# Patient Record
Sex: Female | Born: 1975 | Race: Black or African American | Hispanic: No | Marital: Married | Smoking: Never smoker
Health system: Southern US, Community
[De-identification: ages and names within clinical notes are randomized; demographics above are authoritative.]

## PROBLEM LIST (undated history)

## (undated) DIAGNOSIS — Z992 Dependence on renal dialysis: Secondary | ICD-10-CM

## (undated) DIAGNOSIS — D649 Anemia, unspecified: Secondary | ICD-10-CM

## (undated) DIAGNOSIS — R0602 Shortness of breath: Secondary | ICD-10-CM

## (undated) DIAGNOSIS — N186 End stage renal disease: Secondary | ICD-10-CM

## (undated) DIAGNOSIS — M1990 Unspecified osteoarthritis, unspecified site: Secondary | ICD-10-CM

## (undated) DIAGNOSIS — R519 Headache, unspecified: Secondary | ICD-10-CM

## (undated) DIAGNOSIS — E785 Hyperlipidemia, unspecified: Secondary | ICD-10-CM

## (undated) DIAGNOSIS — E079 Disorder of thyroid, unspecified: Secondary | ICD-10-CM

## (undated) DIAGNOSIS — J189 Pneumonia, unspecified organism: Secondary | ICD-10-CM

## (undated) DIAGNOSIS — S92909A Unspecified fracture of unspecified foot, initial encounter for closed fracture: Secondary | ICD-10-CM

## (undated) DIAGNOSIS — I420 Dilated cardiomyopathy: Secondary | ICD-10-CM

## (undated) DIAGNOSIS — N920 Excessive and frequent menstruation with regular cycle: Secondary | ICD-10-CM

## (undated) DIAGNOSIS — M109 Gout, unspecified: Secondary | ICD-10-CM

## (undated) DIAGNOSIS — I509 Heart failure, unspecified: Secondary | ICD-10-CM

## (undated) DIAGNOSIS — Z5189 Encounter for other specified aftercare: Secondary | ICD-10-CM

## (undated) DIAGNOSIS — Z9289 Personal history of other medical treatment: Secondary | ICD-10-CM

## (undated) DIAGNOSIS — Z8489 Family history of other specified conditions: Secondary | ICD-10-CM

## (undated) DIAGNOSIS — I10 Essential (primary) hypertension: Secondary | ICD-10-CM

## (undated) DIAGNOSIS — R011 Cardiac murmur, unspecified: Secondary | ICD-10-CM

## (undated) DIAGNOSIS — N189 Chronic kidney disease, unspecified: Secondary | ICD-10-CM

## (undated) DIAGNOSIS — F419 Anxiety disorder, unspecified: Secondary | ICD-10-CM

## (undated) DIAGNOSIS — Z8701 Personal history of pneumonia (recurrent): Secondary | ICD-10-CM

## (undated) HISTORY — DX: Personal history of pneumonia (recurrent): Z87.01

## (undated) HISTORY — DX: Disorder of thyroid, unspecified: E07.9

## (undated) HISTORY — DX: Unspecified fracture of unspecified foot, initial encounter for closed fracture: S92.909A

## (undated) HISTORY — DX: Essential (primary) hypertension: I10

## (undated) HISTORY — DX: Chronic kidney disease, unspecified: N18.9

## (undated) HISTORY — DX: Hyperlipidemia, unspecified: E78.5

## (undated) HISTORY — DX: Morbid (severe) obesity due to excess calories: E66.01

## (undated) HISTORY — DX: Dilated cardiomyopathy: I42.0

---
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
## 1999-03-02 ENCOUNTER — Emergency Department (HOSPITAL_COMMUNITY): Admission: EM | Admit: 1999-03-02 | Discharge: 1999-03-02 | Payer: Self-pay | Admitting: Emergency Medicine

## 2002-09-17 ENCOUNTER — Encounter: Payer: Self-pay | Admitting: Emergency Medicine

## 2002-09-17 ENCOUNTER — Emergency Department (HOSPITAL_COMMUNITY): Admission: EM | Admit: 2002-09-17 | Discharge: 2002-09-17 | Payer: Self-pay | Admitting: Emergency Medicine

## 2003-09-09 ENCOUNTER — Emergency Department (HOSPITAL_COMMUNITY): Admission: EM | Admit: 2003-09-09 | Discharge: 2003-09-10 | Payer: Self-pay | Admitting: Emergency Medicine

## 2003-12-01 ENCOUNTER — Emergency Department (HOSPITAL_COMMUNITY): Admission: EM | Admit: 2003-12-01 | Discharge: 2003-12-01 | Payer: Self-pay | Admitting: Emergency Medicine

## 2005-11-10 ENCOUNTER — Emergency Department (HOSPITAL_COMMUNITY): Admission: EM | Admit: 2005-11-10 | Discharge: 2005-11-11 | Payer: Self-pay | Admitting: Emergency Medicine

## 2006-10-21 ENCOUNTER — Emergency Department (HOSPITAL_COMMUNITY): Admission: EM | Admit: 2006-10-21 | Discharge: 2006-10-22 | Payer: Self-pay | Admitting: Emergency Medicine

## 2008-02-15 ENCOUNTER — Ambulatory Visit: Payer: Self-pay | Admitting: Cardiology

## 2008-02-16 ENCOUNTER — Inpatient Hospital Stay (HOSPITAL_COMMUNITY): Admission: EM | Admit: 2008-02-16 | Discharge: 2008-02-20 | Payer: Self-pay | Admitting: Emergency Medicine

## 2008-02-16 ENCOUNTER — Encounter: Payer: Self-pay | Admitting: Cardiovascular Disease

## 2008-03-06 ENCOUNTER — Ambulatory Visit: Payer: Self-pay | Admitting: Cardiovascular Disease

## 2008-03-06 LAB — CONVERTED CEMR LAB
Bilirubin, Direct: 0.1 mg/dL (ref 0.0–0.3)
Calcium: 8.5 mg/dL (ref 8.4–10.5)
GFR calc Af Amer: 35 mL/min
GFR calc non Af Amer: 29 mL/min
Glucose, Bld: 93 mg/dL (ref 70–99)
HDL: 50.3 mg/dL (ref >39.0)
LDL Cholesterol: 106 mg/dL — ABNORMAL HIGH (ref 0–99)
Potassium: 4.3 meq/L (ref 3.5–5.1)
Sodium: 142 meq/L (ref 135–145)
Total Bilirubin: 0.8 mg/dL (ref 0.3–1.2)
Total CHOL/HDL Ratio: 3.5
Total Protein: 7.2 g/dL (ref 6.0–8.3)
VLDL: 20 mg/dL (ref 0–40)

## 2008-04-20 DIAGNOSIS — E785 Hyperlipidemia, unspecified: Secondary | ICD-10-CM | POA: Insufficient documentation

## 2008-04-20 DIAGNOSIS — N183 Chronic kidney disease, stage 3 unspecified: Secondary | ICD-10-CM | POA: Insufficient documentation

## 2008-04-20 DIAGNOSIS — I5021 Acute systolic (congestive) heart failure: Secondary | ICD-10-CM

## 2008-04-20 DIAGNOSIS — E782 Mixed hyperlipidemia: Secondary | ICD-10-CM | POA: Insufficient documentation

## 2008-04-20 DIAGNOSIS — I420 Dilated cardiomyopathy: Secondary | ICD-10-CM | POA: Insufficient documentation

## 2008-04-20 DIAGNOSIS — I10 Essential (primary) hypertension: Secondary | ICD-10-CM | POA: Insufficient documentation

## 2008-04-20 DIAGNOSIS — I428 Other cardiomyopathies: Secondary | ICD-10-CM | POA: Insufficient documentation

## 2008-04-20 DIAGNOSIS — I080 Rheumatic disorders of both mitral and aortic valves: Secondary | ICD-10-CM | POA: Insufficient documentation

## 2008-04-20 HISTORY — DX: Acute systolic (congestive) heart failure: I50.21

## 2008-05-16 ENCOUNTER — Encounter: Payer: Self-pay | Admitting: Cardiovascular Disease

## 2009-05-27 ENCOUNTER — Emergency Department (HOSPITAL_COMMUNITY): Admission: EM | Admit: 2009-05-27 | Discharge: 2009-05-28 | Payer: Self-pay | Admitting: Emergency Medicine

## 2009-11-15 ENCOUNTER — Emergency Department (HOSPITAL_COMMUNITY): Admission: EM | Admit: 2009-11-15 | Discharge: 2009-11-15 | Payer: Self-pay | Admitting: Emergency Medicine

## 2010-03-23 ENCOUNTER — Emergency Department (HOSPITAL_COMMUNITY): Payer: Self-pay

## 2010-03-23 ENCOUNTER — Emergency Department (HOSPITAL_COMMUNITY)
Admission: EM | Admit: 2010-03-23 | Discharge: 2010-03-23 | Disposition: A | Discharge status: Home / Self Care | Payer: Self-pay | Attending: Emergency Medicine | Admitting: Emergency Medicine

## 2010-03-23 DIAGNOSIS — I509 Heart failure, unspecified: Secondary | ICD-10-CM | POA: Insufficient documentation

## 2010-03-23 DIAGNOSIS — K5900 Constipation, unspecified: Secondary | ICD-10-CM | POA: Insufficient documentation

## 2010-03-23 DIAGNOSIS — R1032 Left lower quadrant pain: Secondary | ICD-10-CM | POA: Insufficient documentation

## 2010-03-23 DIAGNOSIS — Z79899 Other long term (current) drug therapy: Secondary | ICD-10-CM | POA: Insufficient documentation

## 2010-03-23 DIAGNOSIS — R197 Diarrhea, unspecified: Secondary | ICD-10-CM | POA: Insufficient documentation

## 2010-03-23 DIAGNOSIS — E7800 Pure hypercholesterolemia, unspecified: Secondary | ICD-10-CM | POA: Insufficient documentation

## 2010-03-23 DIAGNOSIS — R110 Nausea: Secondary | ICD-10-CM | POA: Insufficient documentation

## 2010-03-23 DIAGNOSIS — I10 Essential (primary) hypertension: Secondary | ICD-10-CM | POA: Insufficient documentation

## 2010-03-23 DIAGNOSIS — R10814 Left lower quadrant abdominal tenderness: Secondary | ICD-10-CM | POA: Insufficient documentation

## 2010-03-23 LAB — CBC
Hemoglobin: 12.3 g/dL (ref 12.0–15.0)
MCH: 28.9 pg (ref 26.0–34.0)
Platelets: 250 10*3/uL (ref 150–400)
RBC: 4.26 MIL/uL (ref 3.87–5.11)
WBC: 13.8 10*3/uL — ABNORMAL HIGH (ref 4.0–10.5)

## 2010-03-23 LAB — DIFFERENTIAL
Basophils Absolute: 0.1 10*3/uL (ref 0.0–0.1)
Basophils Relative: 0 % (ref 0–1)
Lymphocytes Relative: 14 % (ref 12–46)
Lymphs Abs: 2 10*3/uL (ref 0.7–4.0)
Monocytes Relative: 6 % (ref 3–12)

## 2010-03-23 LAB — URINE MICROSCOPIC-ADD ON

## 2010-03-23 LAB — URINALYSIS, ROUTINE W REFLEX MICROSCOPIC
Glucose, UA: NEGATIVE mg/dL
Protein, ur: >300 mg/dL — AB
Specific Gravity, Urine: 1.02 (ref 1.005–1.030)

## 2010-03-23 LAB — BASIC METABOLIC PANEL
CO2: 23 mEq/L (ref 19–32)
Chloride: 107 mEq/L (ref 96–112)
GFR calc Af Amer: 16 mL/min — ABNORMAL LOW (ref >60)
Potassium: 4.7 mEq/L (ref 3.5–5.1)
Sodium: 135 mEq/L (ref 135–145)

## 2010-04-21 LAB — URINALYSIS, ROUTINE W REFLEX MICROSCOPIC
Glucose, UA: NEGATIVE mg/dL
Hgb urine dipstick: NEGATIVE
Ketones, ur: NEGATIVE mg/dL
Leukocytes, UA: NEGATIVE
Protein, ur: >300 mg/dL — AB
Urobilinogen, UA: 0.2 mg/dL (ref 0.0–1.0)

## 2010-04-21 LAB — CBC
HCT: 35.5 % — ABNORMAL LOW (ref 36.0–46.0)
HCT: 37 % (ref 36.0–46.0)
HCT: 40.8 % (ref 36.0–46.0)
Hemoglobin: 12.5 g/dL (ref 12.0–15.0)
Hemoglobin: 14.1 g/dL (ref 12.0–15.0)
MCHC: 33.7 g/dL (ref 30.0–36.0)
MCHC: 34.6 g/dL (ref 30.0–36.0)
MCV: 86.8 fL (ref 78.0–100.0)
MCV: 88.6 fL (ref 78.0–100.0)
MCV: 86.7 fL (ref 78.0–100.0)
Platelets: 221 K/uL (ref 150–400)
Platelets: 287 10*3/uL (ref 150–400)
Platelets: 296 10*3/uL (ref 150–400)
RBC: 4.01 MIL/uL (ref 3.87–5.11)
RBC: 4.2 MIL/uL (ref 3.87–5.11)
RBC: 4.7 MIL/uL (ref 3.87–5.11)
RBC: 4.35 MIL/uL (ref 3.87–5.11)
RDW: 15.6 % — ABNORMAL HIGH (ref 11.5–15.5)
RDW: 15.7 % — ABNORMAL HIGH (ref 11.5–15.5)
WBC: 10.5 10*3/uL (ref 4.0–10.5)
WBC: 12.4 K/uL — ABNORMAL HIGH (ref 4.0–10.5)
WBC: 13 10*3/uL — ABNORMAL HIGH (ref 4.0–10.5)
WBC: 9.3 10*3/uL (ref 4.0–10.5)

## 2010-04-21 LAB — COMPREHENSIVE METABOLIC PANEL
ALT: 16 U/L (ref 0–35)
AST: 15 U/L (ref 0–37)
Alkaline Phosphatase: 42 U/L (ref 39–117)
CO2: 29 mEq/L (ref 19–32)
Calcium: 8.3 mg/dL — ABNORMAL LOW (ref 8.4–10.5)
GFR calc Af Amer: 25 mL/min — ABNORMAL LOW (ref >60)
GFR calc non Af Amer: 20 mL/min — ABNORMAL LOW (ref >60)
Glucose, Bld: 84 mg/dL (ref 70–99)
Potassium: 3.4 mEq/L — ABNORMAL LOW (ref 3.5–5.1)
Sodium: 138 mEq/L (ref 135–145)

## 2010-04-21 LAB — POCT I-STAT, CHEM 8
BUN: 19 mg/dL (ref 6–23)
Creatinine, Ser: 2.3 mg/dL — ABNORMAL HIGH (ref 0.4–1.2)
Glucose, Bld: 96 mg/dL (ref 70–99)
Hemoglobin: 14.6 g/dL (ref 12.0–15.0)
Sodium: 142 mEq/L (ref 135–145)
TCO2: 30 mmol/L (ref 0–100)

## 2010-04-21 LAB — LIPID PANEL
HDL: 47 mg/dL (ref >39)
Triglycerides: 89 mg/dL (ref <150)
VLDL: 18 mg/dL (ref 0–40)

## 2010-04-21 LAB — URINALYSIS, MICROSCOPIC ONLY
Glucose, UA: NEGATIVE mg/dL
Leukocytes, UA: NEGATIVE
Protein, ur: >300 mg/dL — AB
Specific Gravity, Urine: 1.019 (ref 1.005–1.030)
pH: 6 (ref 5.0–8.0)

## 2010-04-21 LAB — DIFFERENTIAL
Basophils Absolute: 0 K/uL (ref 0.0–0.1)
Basophils Relative: 0 % (ref 0–1)
Eosinophils Absolute: 0.2 K/uL (ref 0.0–0.7)
Eosinophils Relative: 2 % (ref 0–5)
Lymphocytes Relative: 13 % (ref 12–46)
Lymphs Abs: 1.6 K/uL (ref 0.7–4.0)
Monocytes Absolute: 0.1 K/uL (ref 0.1–1.0)
Monocytes Relative: 1 % — ABNORMAL LOW (ref 3–12)
Neutro Abs: 10.5 K/uL — ABNORMAL HIGH (ref 1.7–7.7)
Neutrophils Relative %: 85 % — ABNORMAL HIGH (ref 43–77)

## 2010-04-21 LAB — CARDIAC PANEL(CRET KIN+CKTOT+MB+TROPI)
Relative Index: 2.7 — ABNORMAL HIGH (ref 0.0–2.5)
Total CK: 102 U/L (ref 7–177)
Total CK: 139 U/L (ref 7–177)
Troponin I: 0.04 ng/mL (ref 0.00–0.06)
Troponin I: 0.05 ng/mL (ref 0.00–0.06)

## 2010-04-21 LAB — RENAL FUNCTION PANEL
Albumin: 2.5 g/dL — ABNORMAL LOW (ref 3.5–5.2)
BUN: 25 mg/dL — ABNORMAL HIGH (ref 6–23)
Calcium: 7.9 mg/dL — ABNORMAL LOW (ref 8.4–10.5)
Chloride: 98 mEq/L (ref 96–112)
Creatinine, Ser: 2.59 mg/dL — ABNORMAL HIGH (ref 0.4–1.2)
GFR calc Af Amer: 26 mL/min — ABNORMAL LOW (ref >60)
GFR calc non Af Amer: 21 mL/min — ABNORMAL LOW (ref >60)
Phosphorus: 5.6 mg/dL — ABNORMAL HIGH (ref 2.3–4.6)

## 2010-04-21 LAB — UIFE/LIGHT CHAINS/TP QN, 24-HR UR
Albumin, U: DETECTED
Beta, Urine: DETECTED — AB
Free Lambda Lt Chains,Ur: 1.78 mg/dL — ABNORMAL HIGH (ref 0.08–1.01)
Gamma Globulin, Urine: DETECTED — AB

## 2010-04-21 LAB — RAPID URINE DRUG SCREEN, HOSP PERFORMED
Amphetamines: NOT DETECTED
Opiates: NOT DETECTED
Tetrahydrocannabinol: NOT DETECTED

## 2010-04-21 LAB — PROTEIN ELECTROPHORESIS, SERUM
Albumin ELP: 43.1 % — ABNORMAL LOW (ref 55.8–66.1)
Beta 2: 6.1 % (ref 3.2–6.5)
Beta Globulin: 7.4 % — ABNORMAL HIGH (ref 4.7–7.2)
Gamma Globulin: 25.1 % — ABNORMAL HIGH (ref 11.1–18.8)

## 2010-04-21 LAB — BASIC METABOLIC PANEL
BUN: 22 mg/dL (ref 6–23)
CO2: 30 mEq/L (ref 19–32)
Calcium: 8.5 mg/dL (ref 8.4–10.5)
Chloride: 103 mEq/L (ref 96–112)
Chloride: 103 mEq/L (ref 96–112)
Creatinine, Ser: 2.53 mg/dL — ABNORMAL HIGH (ref 0.4–1.2)
Creatinine, Ser: 1.97 mg/dL — ABNORMAL HIGH (ref 0.4–1.2)
GFR calc Af Amer: 26 mL/min — ABNORMAL LOW (ref >60)
GFR calc Af Amer: 35 mL/min — ABNORMAL LOW (ref >60)
GFR calc non Af Amer: 26 mL/min — ABNORMAL LOW (ref >60)
Glucose, Bld: 90 mg/dL (ref 70–99)
Potassium: 3.8 mEq/L (ref 3.5–5.1)
Sodium: 139 mEq/L (ref 135–145)

## 2010-04-21 LAB — URINE CULTURE: Colony Count: 30000

## 2010-04-21 LAB — BRAIN NATRIURETIC PEPTIDE
Pro B Natriuretic peptide (BNP): 1008 pg/mL — ABNORMAL HIGH (ref 0.0–100.0)
Pro B Natriuretic peptide (BNP): 406 pg/mL — ABNORMAL HIGH (ref 0.0–100.0)
Pro B Natriuretic peptide (BNP): 814 pg/mL — ABNORMAL HIGH (ref 0.0–100.0)

## 2010-04-21 LAB — TSH: TSH: 1.285 u[IU]/mL (ref 0.350–4.500)

## 2010-04-21 LAB — TROPONIN I: Troponin I: 0.06 ng/mL (ref 0.00–0.06)

## 2010-04-21 LAB — T4, FREE: Free T4: 1.04 ng/dL (ref 0.89–1.80)

## 2010-04-21 LAB — URINE MICROSCOPIC-ADD ON

## 2010-04-21 LAB — CREATININE, URINE, RANDOM: Creatinine, Urine: 239.9 mg/dL

## 2010-04-21 LAB — HEMOGLOBIN A1C
Hgb A1c MFr Bld: 5.4 % (ref 4.6–6.1)
Mean Plasma Glucose: 108 mg/dL

## 2010-04-21 LAB — CK TOTAL AND CKMB (NOT AT ARMC)
CK, MB: 4 ng/mL (ref 0.3–4.0)
Relative Index: 2.3 (ref 0.0–2.5)

## 2010-05-19 NOTE — Consult Note (Signed)
NAMEGERALDINE, [name]             ACCOUNT NO.:  [id number]   MEDICAL RECORD NO.:  [id number]          PATIENT TYPE:  INP   LOCATION:  3705                         FACILITY:  [place]   PHYSICIAN:  Caren Griffins B. Lorrene Reid, M.D.DATE OF BIRTH:  03-12-1975   DATE OF CONSULTATION:  DATE OF DISCHARGE:                                 CONSULTATION   We are asked to see this patient because of abnormal renal function,  proteinuria, and elevated intact PTH.   She is a 35 year old morbidly obese black female who was admitted on  February 15, 2008 with accelerated hypertension.  She presented with a  cough and shortness of breath.  Her blood pressure in the emergency  department was 203/154.  Her chest x-ray was consistent with volume  overload, pulmonary edema, and significant cardiomegaly.  Creatinine on  admission was 2.3.  The only baseline available is from October, 2008,  at which time it was 1.8.   As blood pressure was controlled with IV medications, creatinine peaked  at 2.6, but today is back down to 1.97.   Various studies have been done since admission, including a urinalysis  which showed greater than 300 mg% protein, a renal ultrasound which was  limited because of large body habitus but appeared to show 10.5 and 11.4  cm kidneys, right and left respectively, with some increased  echogenicity.  SPEP showed a polyclonal increase in globulins and  immunofixation on the urine showed no monoclonal free light chains.  Angiotensin converting enzyme level was normal.  Intact PTH was elevated  at 222.   Hypertension has been controlled, initially with IV meds but now  transitioned to po with diastolics in the range of 80 to 100 over the  past 48 hours and she is now on a regimen consisting of carvedilol and  hydralazine at fairly low doses.   Patient states that she was followed at [facility] off and on but  really has not been followed by anybody recently and prior to admission  was not  taking any medications.  She was on Benicar at one time in the  past, but this was too expensive for her to afford.  She was aware that  she had an issue with hypertension for at least the last year, but  simply did not follow up.   PAST MEDICAL HISTORY:  1. Morbid obesity.  2. Hypertension, diagnosed about a year ago and followed at      [facility] (sporadically, due to pt noncompliance).  3. CKD stage 3 with creatinine of 1.8 in October, 2008 and no other      baseline data available.  4. History of cardiac surgery at [facility] at age 38 for a      hole in her heart.  5. Dilated cardiomyopathy with ejection fraction of 30%, severe      diffuse left ventricular hypokinesis, severe left ventricular      hypertrophy, and moderate diastolic dysfunction by echo on      02/16/2008 (further w/u planned to evaluate for infiltrative      process).  6. History of pneumonia.  7. H/s foot fracture in the past   CURRENT MEDICATIONS:  1. Carvedilol 12.5 mg b.i.d.  2. Hydralazine 10 mg t.i.d.  3. Zocor 20 mg daily.  4. Lasix 40 mg IV today.  5. Subcu heparin q.8h.   She has no known drug allergies.   FAMILY HISTORY:  Positive for kidney disease (her mother is one of my  clinic patients at [facility] who has CKD in the setting of  diabetes and hypertension).  Her family history is strongly positive for  hypertension as well as diabetes.   SOCIAL HISTORY:  (Patient works as a Quarry manager.  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.  She does not smoke or drink and has  never used street drugs.  She is single, has no children, has a dog.   REVIEW OF SYSTEMS:  Significant for much improvement in her presentation  with shortness of breath, although she has been chronically short of  breath, she states, all throughout her childhood but much more so on  presentation.  She has had swelling in her legs and feet for at least a  week.  She denies chest pain, nausea,  vomiting, change in appetite,  sputum production, arthritis, arthralgia, hematuria, passage of kidney  stones, tea or coca-cola-colored urine, dysuria, recurrent urinary tract  infections.   PHYSICAL EXAMINATION:  She is a delightful, pleasant but morbidly obese  black female.  Blood pressure 156/100, heart rate in the 70's.  I cannot visualize her  neck veins because of her large body habitus.  Breath sounds are  diminished at the lung bases, but she has no rales or wheezes.  CARDIAC:  Regular.  S1 and S2.  No audible S3 or S4.  No audible murmur.  She has a large abdomen with a very large pannus.  Exam is difficult,  but there is minimally no focal abdominal tenderness.  She has 1+ pretibial pitting edema to the knees bilaterally.   LABORATORY TODAY:  Sodium 139, potassium 3.9, chloride 103, CO2 29, BUN  19, creatinine 1.97, calcium 8.7.  Hemoglobin 12.9, platelets 294,000,  WBC 9300.  Urinalysis:  Specific gravity 1.019 with greater than 300 mg%  protein.  SPEP and urine immunofixation negative.  ACE level normal.  Intact PTH 220.   IMPRESSION:  A 35 year old morbidly obese black female with  1. Accelerated hypertension  - diagnosis of HTN for about a year, but      not consistently treated, currently improved on medication.  2. Acute-on-chronic renal insufficiency in the setting of control of      severe hypertension, with creatinine bump secondary to impaired      autoregulation, now improvined back to baseline  3. Underlying chronic kidney disease (CKD III) with a creatinine of      1.8 in 2008, proteinuria by dipstick.  Her underlying disease could      easily be related to hypertension or to obesity-related focal      segmental glomerular sclerosis.  She will need reassessment      (quantitation) of her proteinuria after at least a month of good      blood pressure control.  She is willing to follow up with me in the      office, so will arrange an appointment to complete her  renal      evaluation as an outpatient.  4. Hyperparathyroidism - new diagnosis.  Will address her elevated PTH      level at  that office appointment.  Vitamin D analogs are fairly      expensive.  She has limited resources.  Will try to get her on a      patient-assistant program as an outpatient.  5. With regard to her antihypertensive regimen, I would add Lasix 40      mg a day to her current program and consider increasing either her      carvedilol or her hydralazine (I have increased hydralazine to 25      mg TID given diastolics of A999333 today).  6. Dilated cardiomyopathy with LVH and LV dilatation with EF 30% -      strongly suspect this is related to poorly controlled hypertension.      Further evaluation per cardiology.   Thanks for asking [place] to see her, and I will follow up with her at  [facility] within the next month.      Elzie Rings Lorrene Reid, M.D.  Electronically Signed     CBD/MEDQ  D:  02/20/2008  T:  02/20/2008  Job:  [id number]   cc:   Faye Ramsay. Johnsie Cancel, MD, [facility]

## 2010-05-19 NOTE — Discharge Summary (Signed)
[name], [name]             ACCOUNT NO.:  [id number]   MEDICAL RECORD NO.:  [id number]          PATIENT TYPE:  INP   LOCATION:  3705                         FACILITY:  [place]   PHYSICIAN:  Lauree Chandler, MDDATE OF BIRTH:  [birth year]   DATE OF ADMISSION:  02/15/2008  DATE OF DISCHARGE:  02/20/2008                               DISCHARGE SUMMARY   PRIMARY CARE PHYSICIAN:  [facility].   PRIMARY CARDIOLOGIST:  None prior to this admission.   DISCHARGE DIAGNOSES:  1. Hypertension that was severely uncontrolled prior to admission with      moderate control prior to discharge.  2. Morbid obesity.  3. Chronic kidney disease, stage III with baseline creatinine of 1.8      in October 2008 and now with baseline of around 2.0.  4. New diagnosis of dilated cardiomyopathy with moderately to markedly      decreased left ventricular systolic function with an ejection      fraction of 30%.  There was severe diffuse left ventricular      hypokinesis with left ventricular wall thickness increased in a      pattern of severe concentric hypertrophy.  There was moderate      diastolic dysfunction.  5. Mild mitral valvular regurgitation.  6. Hyperlipidemia.  7. Acute systolic congestive heart failure.   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  She apparently  stopped taking the medications because she did not have money to fill  them.  She presented to the emergency department on February 15, 2008,  with complaints of shortness of breath, dyspnea on exertion, and cough  for a few weeks that was progressive to the point where she  could not  complete sentences and had become short of breath at rest.  She denied  any chest pain or fevers on admission.  In the emergency department, she  was found to be in congestive heart failure with pulmonary edema on her  chest x-ray as well as lower extremity edema.  Her blood pressure was  203/154 at the time of presentation.  She was treated with a  nitroglycerin drip and a Lasix intravenous drip and transferred to the  intensive care unit.   HOSPITAL COURSE:  The patient was admitted from the emergency department  to the intensive care unit and treated with intravenous nitroglycerin as  well as intravenous Lasix.  She diuresed well and had much better  control of her blood pressure on minimal medical therapy.  She was  initially treated with Imdur after the nitroglycerin drip was stopped,  hydralazine, and Coreg.  She was given bolus dosing of Lasix after the  first hospital day.  It was discovered during this hospitalization that  she had moderate-to-severe left ventricular dysfunction with  concentric  left ventricular hypertrophy and moderate dilation of the left  ventricle.  It was felt that this was most likely secondary to long-  standing hypertension.  There was some talk about a possible cardiac MRI  to rule out infiltrative disease, which was less likely on the  differential.  An ACE level was obtained because of some concern for  sarcoidosis; however, this was normal.  The patient's creatinine rose to  2.6 during intravenous diuresis.  This did come back down to 2.0, which  is felt to be her baseline prior to discharge.  A nephrology consult was  obtained secondary to proteinuria as well as an elevated parathyroid  hormone level.  The patient was seen by Dr. Buelah Manis of Nephrology on  February 20, 2008.  It was felt that her chronic kidney disease is most  likely secondary to hypertension.  Dr. Buelah Manis suggested followup in the  Nephrology Clinic with another assessment  of her urine for protein after  her blood pressure has been under good control for at least a month.  From a cardiac standpoint, the patient diuresed well and had much better  control of her blood pressure prior to discharge.  On the day of  discharge, her blood pressure was in the [id number] systolically.  We talked  about titrating her medications further here in the hospital; however,  the patient was adamant about going home today.  Her discharge  medications will be outlined in detail below.   The patient will need close followup in the Cardiology Office to assess  her left ventricular dysfunction.  We are not going to send her home on  an ACE inhibitor with her current renal insufficiency.  Once it has been  established that her baseline creatinine is around 2.0, then we will  discuss this with Nephrology, consider starting her on a low-dose ACE  inhibitor.  In the meantime, I think it is most important to adequately  control her blood pressure.   DISCHARGE MEDICATIONS:  1. Lasix 40 mg once daily.  2. Hydralazine 25 mg three times daily.  3. Coreg 12.5 mg twice daily.  4. Pravastatin 40 mg once daily.  5. Aspirin 81 mg once daily.   The patient will follow up in my Cardiology Office at the [facility]  location in 3 weeks.  I would like to check a metabolic profile as well  as liver function tests at the time of that visit.  The patient will be  contacted by the Nephrology Office for arrangements to be made for  followup with Dr. Buelah Manis.  The patient is to monitor her weight closely  at home and is to alert our office if she has any significant change in  her weight.  I have encouraged her to take all of her medications as  outlined above.  Further workup of her cardiomyopathy will be obtained  as an outpatient.  I would consider a cardiac MRI; however, we will have  to have this arranged in a location that is able to accommodate someone  with such a large body habitus.    PROCEDURES DURING HOSPITALIZATION:  1. Ultrasound of the abdomen showed a 2.7-cm mobile gallstone in the      gallbladder.  The kidneys were echogenic that suggest a chronic      medical renal disease.  This was a difficult study secondary to the      patient's body habitus.  The kidneys appeared to be grossly normal  in size.  2. Chest x-ray on admission showed pulmonary edema bilaterally, but      prior to discharge had improved.  3. Echocardiogram performed on February 16, 2008, showed markedly      dilated left ventricle with moderately to markedly reduced left      ventricular systolic function.  The left ventricular ejection      fraction is estimated at 30%.  There was severe diffuse left      ventricular hypokinesis.  The left ventricular wall thickness was      increased in a pattern of severe concentric hypertrophy.  These      were consistent with a pseudonormal left ventricular filling      pattern with concomitant abnormal relaxation and increased filling      pressure.  Features were consistent with moderate diastolic      dysfunction.  There was mild mitral valvular regurgitation.  The      left atrium was moderately dilated.   Time spent on discharge including physician time is 45 minutes.      Lauree Chandler, MD  Electronically Signed    CM/MEDQ  D:  02/20/2008  T:  02/21/2008  Job:  [id number]

## 2010-05-19 NOTE — H&P (Signed)
[name], [name]             ACCOUNT NO.:  [id number]   MEDICAL RECORD NO.:  [id number]          PATIENT TYPE:  INP   LOCATION:  1826                         FACILITY:  [place]   PHYSICIAN:  Darryl D. Prime, MD    DATE OF BIRTH:  [birth year]   DATE OF ADMISSION:  02/15/2008  DATE OF DISCHARGE:                              HISTORY & PHYSICAL   The patient has no primary cardiologist.   PRIMARY CARE PHYSICIAN:  She sees Theme park manager.   CHIEF COMPLAINT:  Shortness of breath and cough.   HISTORY OF PRESENT ILLNESS:  [name] is a 35 year old female with a  history of hypertension and obesity who presents with shortness of  breath.  The patient notes hypertension diagnosed for a year.  She was  on Benicar at one point but this was discontinued, unsure exactly why,  and she has been on Lasix but has not refilled the Lasix medication.  She notes shortness of breath, dyspnea on exertion but associated cough  of white sputum for the last few weeks that has been progressive to the  point where she cannot complete sentences, short of breath at rest.  The  patient denies any weight gain recently and notes that she may have  actually lost weight.  She denies any chest pain, no recent fevers.  She  notes orthopnea but no paroxysmal nocturnal dyspnea.  She does not  adhere to a low salt diet.  No wheezing.  In the emergency room the  patient was found to have blood pressures in excess of A999333 systolic with  significant pulmonary edema on chest x-ray and lower extremity edema.  She was given Lasix IV and nitrates were also ordered.   PAST MEDICAL HISTORY:  No history of echocardiogram in the past.  History of hypertension as above.  History in the medical record of  diabetes which she denies.  History of obesity.  History of chronic  kidney disease with creatinine 1.8 in 2008.  She has a history of heart  surgery at the age of 36 years old with a significant scar on her back.  She  knows she had a hole in her heart.   SOCIAL HISTORY:  She is single.  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  No tobacco, alcohol or drug use.   FAMILY HISTORY:  Positive for hypertension in five aunts on her maternal  side and the mother with hypertension.  No one is on dialysis.   REVIEW OF SYSTEMS:  A 14 point review of systems negative unless stated  above.   PHYSICAL EXAMINATION:  VITAL SIGNS:  Temperature is 97.7 with a pulse of  119, respiratory rate is 13, blood pressure 203/154, sats of 91% on room  air.  GENERAL:  She is an obese female sitting upright using her accessory  muscles in mild respiratory distress.  HEENT:  Normocephalic, atraumatic.  Pupils equal, round and reactive to  light.  Oropharynx is moist.  NECK:  Supple.  Jugular venous distention is difficult to assess due to  body habitus.  CARDIOVASCULAR:  Displacement of the point of maximal in both  auscultation of the heart leftward.  S1 and S2 noted.  She may have an  S3 gallop.  ABDOMEN:  Obese, soft, nontender, nondistended with no  hepatosplenomegaly.  EXTREMITIES:  Showed no clubbing or cyanosis.  There is 3+ lower  extremity edema that is tight pitting right side greater than left.   EKG showed sinus rhythm at a rate of 107 beats per minute, PR interval  132, QRS 102, QT corrected 502.  She has significant left ventricular  hypertrophy with evidence of strain and repolarization abnormalities.  Chest x-ray shows cardiomegaly with pulmonary edema, increased edema  compared to August of 2008.   LABORATORY DATA:  White count is 12.4 with a hemoglobin of 14.1,  hematocrit 40.8, platelets of 221, segs 85.  The patient's sodium is  142, potassium 3.9, chloride 104, bicarb 30, BUN 19, creatinine 2.3,  glucose 96.  B type natriuretic peptide was 1008.   ASSESSMENT AND PLAN:  This is a patient with a history of hypertension  at a relatively early age raising the possibility of  secondary causes of  hypertension and part of her problem.  She comes in with profound volume  overload, acute congestive heart failure.  She may indeed have a dilated  cardiomyopathy.  She has pulmonary edema on chest x-ray.  She will be  admitted for vasodilative therapy and diuretics.  Will also control her  blood pressure.  She will be given labetalol drip here initially.  She  will be given nitrates and hydralazine.  Will hold off on ACE inhibitors  for now given her creatinine being up since she will be diuresed.  We  will give her Lasix drip to prevent worsening kidney dysfunction with  diuresis.  Will get an echocardiogram.  She will need a dietary consult.      Darryl D. Prime, MD  Electronically Signed     DDP/MEDQ  D:  02/16/2008  T:  02/16/2008  Job:  [id number]

## 2010-05-26 ENCOUNTER — Encounter: Payer: Self-pay | Admitting: Vascular Surgery

## 2010-06-06 ENCOUNTER — Emergency Department (HOSPITAL_COMMUNITY): Payer: No Typology Code available for payment source

## 2010-06-06 ENCOUNTER — Emergency Department (HOSPITAL_COMMUNITY)
Admission: EM | Admit: 2010-06-06 | Discharge: 2010-06-06 | Disposition: A | Discharge status: Home / Self Care | Payer: No Typology Code available for payment source | Attending: Emergency Medicine | Admitting: Emergency Medicine

## 2010-06-06 DIAGNOSIS — M79609 Pain in unspecified limb: Secondary | ICD-10-CM | POA: Insufficient documentation

## 2010-06-06 DIAGNOSIS — I10 Essential (primary) hypertension: Secondary | ICD-10-CM | POA: Insufficient documentation

## 2010-06-06 DIAGNOSIS — Y92410 Unspecified street and highway as the place of occurrence of the external cause: Secondary | ICD-10-CM | POA: Insufficient documentation

## 2010-06-06 DIAGNOSIS — S8000XA Contusion of unspecified knee, initial encounter: Secondary | ICD-10-CM | POA: Insufficient documentation

## 2010-06-06 DIAGNOSIS — S139XXA Sprain of joints and ligaments of unspecified parts of neck, initial encounter: Secondary | ICD-10-CM | POA: Insufficient documentation

## 2010-06-06 DIAGNOSIS — E7800 Pure hypercholesterolemia, unspecified: Secondary | ICD-10-CM | POA: Insufficient documentation

## 2010-06-11 ENCOUNTER — Emergency Department (HOSPITAL_COMMUNITY)
Admission: EM | Admit: 2010-06-11 | Discharge: 2010-06-12 | Disposition: A | Discharge status: Home / Self Care | Payer: Self-pay | Attending: Emergency Medicine | Admitting: Emergency Medicine

## 2010-06-11 ENCOUNTER — Emergency Department (HOSPITAL_COMMUNITY): Payer: Self-pay

## 2010-06-11 DIAGNOSIS — J189 Pneumonia, unspecified organism: Secondary | ICD-10-CM | POA: Insufficient documentation

## 2010-06-11 DIAGNOSIS — J3489 Other specified disorders of nose and nasal sinuses: Secondary | ICD-10-CM | POA: Insufficient documentation

## 2010-06-11 DIAGNOSIS — E669 Obesity, unspecified: Secondary | ICD-10-CM | POA: Insufficient documentation

## 2010-06-11 DIAGNOSIS — I509 Heart failure, unspecified: Secondary | ICD-10-CM | POA: Insufficient documentation

## 2010-06-11 DIAGNOSIS — R0989 Other specified symptoms and signs involving the circulatory and respiratory systems: Secondary | ICD-10-CM | POA: Insufficient documentation

## 2010-06-11 DIAGNOSIS — R05 Cough: Secondary | ICD-10-CM | POA: Insufficient documentation

## 2010-06-11 DIAGNOSIS — I10 Essential (primary) hypertension: Secondary | ICD-10-CM | POA: Insufficient documentation

## 2010-06-11 DIAGNOSIS — R059 Cough, unspecified: Secondary | ICD-10-CM | POA: Insufficient documentation

## 2010-10-14 LAB — DIFFERENTIAL
Basophils Absolute: 0.1
Lymphocytes Relative: 16
Monocytes Absolute: 0.9 — ABNORMAL HIGH
Neutro Abs: 9.4 — ABNORMAL HIGH

## 2010-10-14 LAB — CBC
Hemoglobin: 12.6
RBC: 4.43
RDW: 14.5 — ABNORMAL HIGH
WBC: 12.7 — ABNORMAL HIGH

## 2010-10-14 LAB — B-NATRIURETIC PEPTIDE (CONVERTED LAB): Pro B Natriuretic peptide (BNP): 370 — ABNORMAL HIGH

## 2010-10-26 ENCOUNTER — Other Ambulatory Visit: Payer: Self-pay

## 2010-10-26 DIAGNOSIS — Z01810 Encounter for preprocedural cardiovascular examination: Secondary | ICD-10-CM

## 2010-10-26 DIAGNOSIS — N189 Chronic kidney disease, unspecified: Secondary | ICD-10-CM

## 2010-11-11 ENCOUNTER — Encounter: Payer: Self-pay | Admitting: Vascular Surgery

## 2010-11-18 ENCOUNTER — Encounter: Payer: Self-pay | Admitting: Vascular Surgery

## 2010-11-18 ENCOUNTER — Encounter (HOSPITAL_COMMUNITY): Payer: Self-pay | Admitting: *Deleted

## 2010-11-18 ENCOUNTER — Emergency Department (HOSPITAL_COMMUNITY)
Admission: EM | Admit: 2010-11-18 | Discharge: 2010-11-18 | Disposition: A | Discharge status: Home / Self Care | Payer: Self-pay | Attending: Emergency Medicine | Admitting: Emergency Medicine

## 2010-11-18 DIAGNOSIS — E875 Hyperkalemia: Secondary | ICD-10-CM | POA: Insufficient documentation

## 2010-11-18 DIAGNOSIS — N189 Chronic kidney disease, unspecified: Secondary | ICD-10-CM | POA: Insufficient documentation

## 2010-11-18 DIAGNOSIS — R109 Unspecified abdominal pain: Secondary | ICD-10-CM | POA: Insufficient documentation

## 2010-11-18 DIAGNOSIS — K5289 Other specified noninfective gastroenteritis and colitis: Secondary | ICD-10-CM | POA: Insufficient documentation

## 2010-11-18 DIAGNOSIS — Z79899 Other long term (current) drug therapy: Secondary | ICD-10-CM | POA: Insufficient documentation

## 2010-11-18 DIAGNOSIS — K529 Noninfective gastroenteritis and colitis, unspecified: Secondary | ICD-10-CM

## 2010-11-18 DIAGNOSIS — I129 Hypertensive chronic kidney disease with stage 1 through stage 4 chronic kidney disease, or unspecified chronic kidney disease: Secondary | ICD-10-CM | POA: Insufficient documentation

## 2010-11-18 DIAGNOSIS — I509 Heart failure, unspecified: Secondary | ICD-10-CM | POA: Insufficient documentation

## 2010-11-18 DIAGNOSIS — E669 Obesity, unspecified: Secondary | ICD-10-CM | POA: Insufficient documentation

## 2010-11-18 DIAGNOSIS — R1110 Vomiting, unspecified: Secondary | ICD-10-CM | POA: Insufficient documentation

## 2010-11-18 DIAGNOSIS — E079 Disorder of thyroid, unspecified: Secondary | ICD-10-CM | POA: Insufficient documentation

## 2010-11-18 DIAGNOSIS — E785 Hyperlipidemia, unspecified: Secondary | ICD-10-CM | POA: Insufficient documentation

## 2010-11-18 DIAGNOSIS — N289 Disorder of kidney and ureter, unspecified: Secondary | ICD-10-CM

## 2010-11-18 HISTORY — DX: Heart failure, unspecified: I50.9

## 2010-11-18 LAB — POCT I-STAT, CHEM 8
BUN: 38 mg/dL — ABNORMAL HIGH (ref 6–23)
Hemoglobin: 13.3 g/dL (ref 12.0–15.0)
Sodium: 142 mEq/L (ref 135–145)
TCO2: 21 mmol/L (ref 0–100)

## 2010-11-18 LAB — POTASSIUM: Potassium: 5.4 mEq/L — ABNORMAL HIGH (ref 3.5–5.1)

## 2010-11-18 MED ORDER — ONDANSETRON 8 MG PO TBDP
8.0000 mg | ORAL_TABLET | Freq: Once | ORAL | Status: AC
  Administered 2010-11-18: 8 mg via ORAL
  Filled 2010-11-18: qty 1

## 2010-11-18 MED ORDER — ONDANSETRON HCL 4 MG/2ML IJ SOLN
4.0000 mg | Freq: Once | INTRAMUSCULAR | Status: AC
  Administered 2010-11-18: 4 mg via INTRAVENOUS

## 2010-11-18 MED ORDER — DICYCLOMINE HCL 10 MG PO CAPS
10.0000 mg | ORAL_CAPSULE | Freq: Once | ORAL | Status: DC
  Filled 2010-11-18: qty 1

## 2010-11-18 MED ORDER — ONDANSETRON HCL 4 MG/2ML IJ SOLN
INTRAMUSCULAR | Status: AC
  Filled 2010-11-18: qty 2

## 2010-11-18 MED ORDER — ONDANSETRON HCL 4 MG PO TABS: 4.0000 mg | ORAL_TABLET | Freq: Four times a day (QID) | ORAL | Status: DC

## 2010-11-18 MED ORDER — SODIUM CHLORIDE 0.9 % IV BOLUS (SEPSIS)
1000.0000 mL | Freq: Once | INTRAVENOUS | Status: AC
  Administered 2010-11-18: 1000 mL via INTRAVENOUS

## 2010-11-18 MED ORDER — SODIUM CHLORIDE 0.9 % IV BOLUS (SEPSIS): 1000.0000 mL | Freq: Once | INTRAVENOUS | Status: DC

## 2010-11-18 MED ORDER — SODIUM POLYSTYRENE SULFONATE 15 GM/60ML PO SUSP
30.0000 g | Freq: Once | ORAL | Status: AC
  Administered 2010-11-18: 30 g via ORAL
  Filled 2010-11-18: qty 120

## 2010-11-18 NOTE — ED Notes (Signed)
Pt reports throbbing abd pain, n/v/d since this am. Emesis x5 today. Denies blood in stool/emesis. No HTN meds taken today.

## 2010-11-18 NOTE — ED Notes (Signed)
Patient given discharge instructions, information, prescriptions, and diet order. Patient states that they adequately understand discharge information given and to return to ED if symptoms return or worsen.     

## 2010-11-18 NOTE — ED Provider Notes (Signed)
History     CSN: [id number] Arrival date & time: 11/18/2010  9:50 AM   First MD Initiated Contact with Patient 11/18/10 1001      Chief Complaint  Patient presents with  . Abdominal Pain    (Consider location/radiation/quality/duration/timing/severity/associated sxs/prior treatment) HPI  Patient presents to emergency department complaining of acute onset abdominal cramping, nausea, vomiting, diarrhea. Patient states she woke this morning and attempted to eat breakfast but then had acute onset abdominal cramping followed by vomiting and diarrhea. Patient states she's had multiple episodes of vomiting and loose stools throughout the morning. Patient states she's been unable to tolerate fluids and has been unable to take her daily medication because of vomiting. Patient denies fevers, chills, chest pain, shortness of breath, blood in the stool or emesis. Patient states she's had friends and family members in the last few days with similar symptoms. Patient states abdominal cramping comes in waves, waxing and waning with no point specific abdominal pain. Grating or alleviating factors. Symptoms were acute onset, waxing and waning but persistent.  Past Medical History  Diagnosis Date  . Chronic kidney disease   . Hypertension   . Thyroid disease   . Morbid obesity   . Hyperlipidemia   . History of pneumonia   . Foot fracture   . Dilated cardiomyopathy   . CHF (congestive heart failure)     History reviewed. No pertinent past surgical history.  No family history on file.  History  Substance Use Topics  . Smoking status: Never Smoker   . Smokeless tobacco: Not on file  . Alcohol Use: No    OB History    Grav Para Term Preterm Abortions TAB SAB Ect Mult Living                  Review of Systems  All other systems reviewed and are negative.    Allergies  Review of patient's allergies indicates no known allergies.  Home Medications   Current Outpatient Rx  Name Route  Sig Dispense Refill  . ASPIRIN EC 81 MG PO TBEC Oral Take 81 mg by mouth daily.     Marland Kitchen CARVEDILOL 12.5 MG PO TABS Oral Take 12.5 mg by mouth 2 (two) times daily with a meal.     . FUROSEMIDE 40 MG PO TABS Oral Take 40 mg by mouth daily.     Marland Kitchen HYDRALAZINE HCL 50 MG PO TABS Oral Take 50 mg by mouth 2 (two) times daily.     Marland Kitchen LISINOPRIL 10 MG PO TABS Oral Take 10 mg by mouth daily.     Marland Kitchen PRAVASTATIN SODIUM 40 MG PO TABS Oral Take 40 mg by mouth daily.     Marland Kitchen VITAMIN D (ERGOCALCIFEROL) 50000 UNITS PO CAPS Oral Take 50,000 Units by mouth 2 (two) times a week. Monday and Wednesday     . TRAMADOL HCL 50 MG PO TABS Oral Take 50 mg by mouth every 6 (six) hours as needed. Maximum dose= 8 tablets per day      BP 154/89  Pulse 89  Temp 97.9 F (36.6 C)  Resp 24  SpO2 100%  LMP 10/28/2010  Physical Exam  Nursing note and vitals reviewed. Constitutional: She is oriented to person, place, and time. She appears well-developed and well-nourished. No distress.       Morbidly obese  HENT:  Head: Normocephalic and atraumatic.  Eyes: Conjunctivae are normal.  Neck: Normal range of motion. Neck supple.  Cardiovascular: Normal rate, regular rhythm, normal  heart sounds and intact distal pulses.  Exam reveals no gallop and no friction rub.   No murmur heard. Pulmonary/Chest: Effort normal and breath sounds normal. No respiratory distress. She has no wheezes. She has no rales. She exhibits no tenderness.  Abdominal: Bowel sounds are normal. She exhibits no distension and no mass. There is no tenderness. There is no rebound and no guarding.  Musculoskeletal: Normal range of motion. She exhibits no edema and no tenderness.  Neurological: She is alert and oriented to person, place, and time.  Skin: Skin is warm and dry. No rash noted. She is not diaphoretic. No erythema.  Psychiatric: She has a normal mood and affect.    ED Course  Procedures (including critical care time)  Fluids, ODT Zofran, by mouth  Bentyl  Patient is followed by nephrologist for chronic kidney disease.  I spoke with Dr. Arty Baumgartner who is oncall for Dr. Lorrene Reid. He will be speaking with Dr. Lorrene Reid in about 30 minutes giving her the patient's information for call back for OP f/u. He advises having patient stop her ACE inhibitor, staying well hydrated, and Dr. Lorrene Reid will call her and have her followup in the office either tomorrow or Friday for recheck of her labs. Patient is agreeable to this plan.   Date: 11/18/2010  Rate: 80  Rhythm: normal sinus rhythm  QRS Axis: normal  Intervals: normal  ST/T Wave abnormalities: normal  Conduction Disutrbances:none  Narrative Interpretation: No significant changes from 02/16/2008 with no peak T waves or decrease in P waves.  Old EKG Reviewed: unchanged    Labs Reviewed  POCT I-STAT, CHEM 8 - Abnormal; Notable for the following:    Potassium 5.5 (*)    Chloride 113 (*)    BUN 38 (*)    Creatinine, Ser 3.60 (*)    All other components within normal limits  I-STAT, CHEM 8   No results found.   1. Gastroenteritis   2. Renal insufficiency   3. Hyperkalemia       MDM  Patient is afebrile, tolerating fluids well, agreeable to following up with her nephrologist tomorrow or Friday for recheck of labs. Patient's stomach is soft, nontender with no peritoneal signs likely gastroenteritis/viral stomach illness.        Eben Burow, [place] 11/18/10 [phone]

## 2010-11-18 NOTE — ED Provider Notes (Signed)
Medical screening examination/treatment/procedure(s) were performed by non-physician practitioner and as supervising physician I was immediately available for consultation/collaboration.    Dot Lanes, MD 11/18/10 [phone]

## 2010-11-19 ENCOUNTER — Encounter: Payer: Self-pay | Admitting: Vascular Surgery

## 2010-11-19 ENCOUNTER — Ambulatory Visit (INDEPENDENT_AMBULATORY_CARE_PROVIDER_SITE_OTHER): Payer: No Typology Code available for payment source | Admitting: Vascular Surgery

## 2010-11-19 VITALS — BP 125/84 | HR 74 | Ht 69.0 in | Wt 367.0 lb

## 2010-11-19 DIAGNOSIS — Z01810 Encounter for preprocedural cardiovascular examination: Secondary | ICD-10-CM

## 2010-11-19 DIAGNOSIS — N184 Chronic kidney disease, stage 4 (severe): Secondary | ICD-10-CM

## 2010-11-19 DIAGNOSIS — N186 End stage renal disease: Secondary | ICD-10-CM

## 2010-11-19 NOTE — Progress Notes (Signed)
BUE vein mapping for AVF placement @ [facility] 11/19/2010

## 2010-11-19 NOTE — Progress Notes (Addendum)
[facility] [place] HISTORY AND PHYSICAL   History of Present Illness:  Patient is a 35 y.o. year old female who presents for placement of a permanent hemodialysis access. The patient is right handed.  The patient is not currently on hemodialysis.  The cause of renal failure is thought to be secondary to hypertension.  Other chronic medical problems include cardiomyopathy, hyperlipidemia. These are currently stable and followed by Dr. Lorrene Reid. She has had no prior access procedures.  Past Medical History  Diagnosis Date  . Chronic kidney disease   . Hypertension   . Thyroid disease   . Morbid obesity   . Hyperlipidemia   . History of pneumonia   . Foot fracture   . Dilated cardiomyopathy   . CHF (congestive heart failure)     Past Surgical History  Procedure Date  . Cardiac surgery 1979    repair of hole in heart     Social History History  Substance Use Topics  . Smoking status: Never Smoker   . Smokeless tobacco: Not on file  . Alcohol Use: No    Family History Family History  Problem Relation Age of Onset  . Diabetes Mother   . Hyperlipidemia Mother   . Hypertension Mother   . Kidney disease Mother     Allergies  No Known Allergies   Current Outpatient Prescriptions  Medication Sig Dispense Refill  . aspirin EC 81 MG tablet Take 81 mg by mouth daily.       . carvedilol (COREG) 12.5 MG tablet Take 12.5 mg by mouth 2 (two) times daily with a meal.       . furosemide (LASIX) 40 MG tablet Take 40 mg by mouth daily.       . hydrALAZINE (APRESOLINE) 50 MG tablet Take 50 mg by mouth 2 (two) times daily.       Marland Kitchen lisinopril (PRINIVIL,ZESTRIL) 10 MG tablet Take 10 mg by mouth daily.       . ondansetron (ZOFRAN) 4 MG tablet Take 1 tablet (4 mg total) by mouth every 6 (six) hours.  12 tablet  0  . pravastatin (PRAVACHOL) 40 MG tablet Take 40 mg by mouth daily.       . Vitamin D, Ergocalciferol, (DRISDOL) 50000 UNITS CAPS Take 50,000 Units by mouth 2  (two) times a week. Monday and Wednesday        No current facility-administered medications for this visit.   Facility-Administered Medications Ordered in Other Visits  Medication Dose Route Frequency Provider Last Rate Last Dose  . DISCONTD: ondansetron (ZOFRAN) 4 MG/2ML injection           . DISCONTD: sodium chloride 0.9 % bolus 1,000 mL  1,000 mL Intravenous Once [employer], [place]        ROS:   General:  No weight loss, Fever, chills  HEENT: No recent headaches, no nasal bleeding, no visual changes, no sore throat  Neurologic: No dizziness, blackouts, seizures. No recent symptoms of stroke or mini- stroke. No recent episodes of slurred speech, or temporary blindness.  Cardiac: No recent episodes of chest pain/pressure, no shortness of breath at rest.  No shortness of breath with exertion.  Denies history of atrial fibrillation or irregular heartbeat  Vascular: No history of rest pain in feet.  No history of claudication.  No history of non-healing ulcer, No history of DVT   Pulmonary: No home oxygen, no productive cough, no hemoptysis,  No asthma or wheezing  Musculoskeletal:  [ ]   Arthritis, [ ]  Low back pain,  [ ]  Joint pain  Hematologic:No history of easy bleeding  Gastrointestinal: Recent gastroenteritis seen in ER yesterday  Urinary: Valu.Nieves ] chronic Kidney disease, [ ]  on HD - [ ]  MWF or [ ]  TTHS, [ ]  Burning with urination, [ ]  Frequent urination, [ ]  Difficulty urinating;   Skin: No rashes  Psychological: No history of anxiety,  No history of depression   Physical Examination  Filed Vitals:   11/19/10 1526  BP: 125/84  Pulse: 74  Height: 5\' 9"  (1.753 m)  Weight: 367 lb (166.47 kg)  SpO2: 100%    Body mass index is 54.20 kg/(m^2).  General:  Alert and oriented, no acute distress HEENT: Normal Neck: No bruit or JVD Pulmonary: Clear to auscultation bilaterally Cardiac: Regular Rate and Rhythm without murmur Gastrointestinal: Soft, non-tender, non-distended,  no mass, no scars, obese Skin: No rash Extremity Pulses:  2+ radial, brachial pulses bilaterally Musculoskeletal: No deformity or edema  Neurologic: Upper and lower extremity motor 5/5 and symmetric  DATA: She had a vein mapping ultrasound today which shows the cephalic vein in the left arm at the forearm level is small. In the upper arm is of good caliber but she has thrombus within the cephalic vein probably from recent IVs or needle sticks at the hospital. In the right upper extremity the forearm vein is also small but the upper arm cephalic vein is also of good quality approximate 4 mm in diameter. The basilic vein is marginal bilaterally but is greater than 3 mm. I reviewed and interpreted her study.   ASSESSMENT: End-stage renal disease now requiring hemodialysis access.   PLAN: Right brachiocephalic AV fistula on [id number]. Risks benefits possible complications and procedure details were explained the patient today including but not limited to bleeding infection non-maturation of the fistula ischemic steal. She understands and agrees to proceed.  Ruta Hinds, MD [facility] [place] Office: [phone] Pager: [phone]  07/29/11  Pt has now decided to have fistula placed.  Procedure scheduled for 08/04/11  Ruta Hinds, MD [facility] [place] Office: [phone] Pager: [phone]

## 2010-11-20 ENCOUNTER — Other Ambulatory Visit: Payer: Self-pay | Admitting: *Deleted

## 2010-11-23 ENCOUNTER — Encounter (HOSPITAL_COMMUNITY): Payer: Self-pay | Admitting: Pharmacy Technician

## 2010-11-27 ENCOUNTER — Encounter (HOSPITAL_COMMUNITY)
Admission: RE | Admit: 2010-11-27 | Discharge: 2010-11-27 | Disposition: A | Discharge status: Home / Self Care | Payer: No Typology Code available for payment source | Source: Ambulatory Visit | Attending: Vascular Surgery | Admitting: Vascular Surgery

## 2010-11-27 ENCOUNTER — Encounter (HOSPITAL_COMMUNITY): Payer: Self-pay

## 2010-11-27 DIAGNOSIS — Z01818 Encounter for other preprocedural examination: Secondary | ICD-10-CM | POA: Insufficient documentation

## 2010-11-27 DIAGNOSIS — Z01812 Encounter for preprocedural laboratory examination: Secondary | ICD-10-CM | POA: Insufficient documentation

## 2010-11-27 HISTORY — DX: Shortness of breath: R06.02

## 2010-11-27 HISTORY — DX: Pneumonia, unspecified organism: J18.9

## 2010-11-27 LAB — CBC
Hemoglobin: 10 g/dL — ABNORMAL LOW (ref 12.0–15.0)
MCH: 29.5 pg (ref 26.0–34.0)
MCHC: 33.1 g/dL (ref 30.0–36.0)
Platelets: 265 10*3/uL (ref 150–400)
RDW: 14.5 % (ref 11.5–15.5)

## 2010-11-27 LAB — DIFFERENTIAL
Basophils Absolute: 0.1 10*3/uL (ref 0.0–0.1)
Eosinophils Relative: 8 % — ABNORMAL HIGH (ref 0–5)
Lymphocytes Relative: 21 % (ref 12–46)
Lymphs Abs: 1.9 10*3/uL (ref 0.7–4.0)
Monocytes Absolute: 0.7 10*3/uL (ref 0.1–1.0)
Neutro Abs: 5.7 10*3/uL (ref 1.7–7.7)

## 2010-11-27 LAB — BASIC METABOLIC PANEL
BUN: 32 mg/dL — ABNORMAL HIGH (ref 6–23)
Chloride: 108 mEq/L (ref 96–112)
GFR calc Af Amer: 17 mL/min — ABNORMAL LOW (ref >90)
GFR calc non Af Amer: 14 mL/min — ABNORMAL LOW (ref >90)
Potassium: 4.3 mEq/L (ref 3.5–5.1)
Sodium: 142 mEq/L (ref 135–145)

## 2010-11-27 LAB — SURGICAL PCR SCREEN: MRSA, PCR: NEGATIVE

## 2010-11-27 LAB — HCG, SERUM, QUALITATIVE: Preg, Serum: NEGATIVE

## 2010-11-27 LAB — PROTIME-INR: Prothrombin Time: 13.7 seconds (ref 11.6–15.2)

## 2010-11-27 MED ORDER — CEFUROXIME SODIUM 1.5 G IJ SOLR: 1.5000 g | INTRAMUSCULAR | Status: DC

## 2010-11-27 NOTE — Pre-Procedure Instructions (Signed)
[place]  11/27/2010   Your procedure is scheduled on:  12/02/2010  Report to [facility] at 0730 AM.  Call this number if you have problems the morning of surgery: [phone]   Remember:   Do not eat food:After Midnight.  Do not drink clear liquids: 4 Hours before arrival.  Take these medicines the morning of surgery with A SIP OF WATER: carvedilol, hydralazine   Do not wear jewelry, make-up or nail polish.  Do not wear lotions, powders, or perfumes. You may wear deodorant.  Do not shave 48 hours prior to surgery.  Do not bring valuables to the hospital.  Contacts, dentures or bridgework may not be worn into surgery.  Leave suitcase in the car. After surgery it may be brought to your room.  For patients admitted to the hospital, checkout time is 11:00 AM the day of discharge.   Patients discharged the day of surgery will not be allowed to drive home.  Name and phone number of your driver: boyfriend- Darnelle Catalan- ph: [phone]  Special Instructions: CHG Shower Use Special Wash: 1/2 bottle night before surgery and 1/2 bottle morning of surgery.   Please read over the following fact sheets that you were given: Pain Booklet, Coughing and Deep Breathing, MRSA Information and Surgical Site Infection Prevention

## 2010-11-27 NOTE — Progress Notes (Signed)
Call to [facility] in [place], reported special needs relative to pt. weight

## 2010-11-28 ENCOUNTER — Encounter (HOSPITAL_COMMUNITY): Payer: Self-pay | Admitting: Anesthesiology

## 2010-11-30 ENCOUNTER — Telehealth: Payer: Self-pay | Admitting: *Deleted

## 2010-11-30 NOTE — Telephone Encounter (Signed)
September called to say she had a bad cold and needed to reshcedule her surgery from 12/02/10 to sometime in Dec. We rescheduled for 12/21/10.

## 2010-12-02 ENCOUNTER — Encounter (HOSPITAL_COMMUNITY): Admission: RE | Payer: Self-pay | Source: Ambulatory Visit

## 2010-12-02 ENCOUNTER — Ambulatory Visit (HOSPITAL_COMMUNITY): Admission: RE | Admit: 2010-12-02 | Payer: Self-pay | Source: Ambulatory Visit | Admitting: Vascular Surgery

## 2010-12-02 SURGERY — ARTERIOVENOUS (AV) FISTULA CREATION
Anesthesia: Monitor Anesthesia Care | Site: Arm Upper | Laterality: Right

## 2010-12-04 NOTE — Procedures (Unsigned)
CEPHALIC VEIN MAPPING  INDICATION:  Chronic kidney disease, stage IV.  HISTORY:  EXAM: The right cephalic vein is compressible.  Diameter measurements range from 0.27 to 0.49 cm.  The right basilic vein is compressible.  Diameter measurements range from 0.25 to 0.35 cm.  The left cephalic vein is not compressible.  Diameter measurements range from 0.21 to 0.72 cm.  the left basilic vein is compressible.  Diameter measurements range from 0.28 to 0.33 cm.  See attached worksheet for all measurements.  IMPRESSION: 1. Patent bilateral basilic veins with diameter measurements as     described above. 2. Patent right cephalic vein with diameter measurements as described     above. 3. Partially thrombosed left cephalic vein with diameter measurements     and thrombus location as noted on worksheet.   ___________________________________________ Jessy Oto. Fields, MD  SH/MEDQ  D:  11/19/2010  T:  11/19/2010  Job:  [id number]

## 2010-12-09 ENCOUNTER — Telehealth: Payer: Self-pay | Admitting: *Deleted

## 2010-12-09 NOTE — Telephone Encounter (Signed)
Spoke with pt whom had cancelled surgery on 12/02/10 for 12/21/10. Dr Oneida Alar had wanted me to reschedule for 12/23/10. She said she had wanted to call to reschedule for 1st of January.I offered her some January dates. She said she would call back in January and do this.

## 2010-12-14 ENCOUNTER — Other Ambulatory Visit (HOSPITAL_COMMUNITY): Payer: No Typology Code available for payment source

## 2010-12-21 ENCOUNTER — Encounter (HOSPITAL_COMMUNITY): Admission: RE | Payer: Self-pay | Source: Ambulatory Visit

## 2010-12-21 ENCOUNTER — Ambulatory Visit (HOSPITAL_COMMUNITY): Admission: RE | Admit: 2010-12-21 | Payer: Self-pay | Source: Ambulatory Visit | Admitting: Vascular Surgery

## 2010-12-21 SURGERY — ARTERIOVENOUS (AV) FISTULA CREATION
Anesthesia: Monitor Anesthesia Care | Site: Arm Upper | Laterality: Right

## 2010-12-25 ENCOUNTER — Encounter: Payer: Self-pay | Admitting: Nephrology

## 2011-01-19 ENCOUNTER — Encounter (HOSPITAL_COMMUNITY): Payer: Self-pay | Admitting: *Deleted

## 2011-01-19 ENCOUNTER — Emergency Department (HOSPITAL_COMMUNITY)
Admission: EM | Admit: 2011-01-19 | Discharge: 2011-01-19 | Disposition: A | Discharge status: Home / Self Care | Payer: Self-pay | Attending: Emergency Medicine | Admitting: Emergency Medicine

## 2011-01-19 ENCOUNTER — Emergency Department (HOSPITAL_COMMUNITY): Payer: Self-pay

## 2011-01-19 DIAGNOSIS — Z7982 Long term (current) use of aspirin: Secondary | ICD-10-CM | POA: Insufficient documentation

## 2011-01-19 DIAGNOSIS — M79609 Pain in unspecified limb: Secondary | ICD-10-CM | POA: Insufficient documentation

## 2011-01-19 DIAGNOSIS — E785 Hyperlipidemia, unspecified: Secondary | ICD-10-CM | POA: Insufficient documentation

## 2011-01-19 DIAGNOSIS — R609 Edema, unspecified: Secondary | ICD-10-CM | POA: Insufficient documentation

## 2011-01-19 DIAGNOSIS — M25579 Pain in unspecified ankle and joints of unspecified foot: Secondary | ICD-10-CM | POA: Insufficient documentation

## 2011-01-19 DIAGNOSIS — I509 Heart failure, unspecified: Secondary | ICD-10-CM | POA: Insufficient documentation

## 2011-01-19 DIAGNOSIS — N189 Chronic kidney disease, unspecified: Secondary | ICD-10-CM | POA: Insufficient documentation

## 2011-01-19 DIAGNOSIS — I129 Hypertensive chronic kidney disease with stage 1 through stage 4 chronic kidney disease, or unspecified chronic kidney disease: Secondary | ICD-10-CM | POA: Insufficient documentation

## 2011-01-19 DIAGNOSIS — M25476 Effusion, unspecified foot: Secondary | ICD-10-CM | POA: Insufficient documentation

## 2011-01-19 DIAGNOSIS — Z79899 Other long term (current) drug therapy: Secondary | ICD-10-CM | POA: Insufficient documentation

## 2011-01-19 DIAGNOSIS — M109 Gout, unspecified: Secondary | ICD-10-CM | POA: Insufficient documentation

## 2011-01-19 DIAGNOSIS — M25473 Effusion, unspecified ankle: Secondary | ICD-10-CM | POA: Insufficient documentation

## 2011-01-19 MED ORDER — HYDROCODONE-ACETAMINOPHEN 5-325 MG PO TABS: 1.0000 | ORAL_TABLET | ORAL | Status: AC | PRN

## 2011-01-19 MED ORDER — INDOMETHACIN 25 MG PO CAPS
50.0000 mg | ORAL_CAPSULE | Freq: Once | ORAL | Status: AC
Start: 2011-01-19 — End: 2011-01-19
  Administered 2011-01-19: 50 mg via ORAL
  Filled 2011-01-19: qty 2

## 2011-01-19 MED ORDER — INDOMETHACIN 50 MG PO CAPS: 50.0000 mg | ORAL_CAPSULE | Freq: Three times a day (TID) | ORAL | Status: AC

## 2011-01-19 MED ORDER — HYDROCODONE-ACETAMINOPHEN 5-325 MG PO TABS
1.0000 | ORAL_TABLET | Freq: Once | ORAL | Status: AC
  Administered 2011-01-19: 1 via ORAL
  Filled 2011-01-19: qty 1

## 2011-01-19 NOTE — ED Provider Notes (Cosign Needed)
History     CSN: [id number]  Arrival date & time 01/19/11  2007   First MD Initiated Contact with Patient 01/19/11 2146      Chief Complaint  Patient presents with  . Foot Pain    (Consider location/radiation/quality/duration/timing/severity/associated sxs/prior treatment) HPI Comments: Patient here with right great toe pain - states over the past week - worse with walking and movement of the toe - states no fever, chills, no history of injury or similar issues.  Patient is a 36 y.o. female presenting with lower extremity pain. The history is provided by the patient. No language interpreter was used.  Foot Pain This is a new problem. The current episode started in the past 7 days. The problem occurs constantly. The problem has been unchanged. Associated symptoms include arthralgias and joint swelling. Pertinent negatives include no abdominal pain, anorexia, chest pain, chills, congestion, coughing, diaphoresis, fatigue, fever, myalgias, nausea, neck pain, numbness, rash, sore throat, vertigo, visual change, vomiting or weakness. The symptoms are aggravated by nothing. She has tried nothing for the symptoms. The treatment provided no relief.  Foot Pain This is a new problem. The current episode started in the past 7 days. The problem occurs constantly. The problem has been unchanged. Pertinent negatives include no chest pain and no abdominal pain. The symptoms are aggravated by nothing. She has tried nothing for the symptoms. The treatment provided no relief.    Past Medical History  Diagnosis Date  . Hypertension   . Thyroid disease   . Morbid obesity   . Hyperlipidemia   . History of pneumonia   . Foot fracture   . Dilated cardiomyopathy   . CHF (congestive heart failure)     hosp. 2011  . Shortness of breath     /w exertion  . Pneumonia     hosp. 2010  . Chronic kidney disease     Past Surgical History  Procedure Date  . Cardiac surgery 1979    repair of hole in heart      Family History  Problem Relation Age of Onset  . Diabetes Mother   . Hyperlipidemia Mother   . Hypertension Mother   . Kidney disease Mother   . Anesthesia problems Neg Hx   . Hypotension Neg Hx   . Malignant hyperthermia Neg Hx   . Pseudochol deficiency Neg Hx     History  Substance Use Topics  . Smoking status: Never Smoker   . Smokeless tobacco: Not on file  . Alcohol Use: Yes     rare use    OB History    Grav Para Term Preterm Abortions TAB SAB Ect Mult Living                  Review of Systems  Constitutional: Negative for fever, chills, diaphoresis and fatigue.  HENT: Negative for congestion, sore throat and neck pain.   Respiratory: Negative for cough.   Cardiovascular: Negative for chest pain.  Gastrointestinal: Negative for nausea, vomiting, abdominal pain and anorexia.  Musculoskeletal: Positive for joint swelling and arthralgias. Negative for myalgias.  Skin: Negative for rash.  Neurological: Negative for vertigo, weakness and numbness.  All other systems reviewed and are negative.    Allergies  Review of patient's allergies indicates no known allergies.  Home Medications   Current Outpatient Rx  Name Route Sig Dispense Refill  . ASPIRIN EC 81 MG PO TBEC Oral Take 81 mg by mouth daily.     Marland Kitchen CARVEDILOL 12.5 MG  PO TABS Oral Take 12.5 mg by mouth 2 (two) times daily with a meal.     . FUROSEMIDE 40 MG PO TABS Oral Take 40 mg by mouth daily.     Marland Kitchen HYDRALAZINE HCL 50 MG PO TABS Oral Take 50 mg by mouth 2 (two) times daily.     Marland Kitchen LISINOPRIL 10 MG PO TABS Oral Take 10 mg by mouth daily.     Marland Kitchen PRAVASTATIN SODIUM 40 MG PO TABS Oral Take 40 mg by mouth daily.     Marland Kitchen VITAMIN D (ERGOCALCIFEROL) 50000 UNITS PO CAPS Oral Take 50,000 Units by mouth 2 (two) times a week. Monday and Wednesday       BP 178/96  Pulse 83  Temp(Src) 98.2 F (36.8 C) (Oral)  Resp 18  Ht 5' 9.5" (1.765 m)  Wt 350 lb (158.759 kg)  BMI 50.94 kg/m2  SpO2 97%  LMP  12/19/2010  Physical Exam  Nursing note and vitals reviewed. Constitutional: She is oriented to person, place, and time. She appears well-developed and well-nourished. No distress.  HENT:  Head: Normocephalic and atraumatic.  Right Ear: External ear normal.  Left Ear: External ear normal.  Nose: Nose normal.  Mouth/Throat: Oropharynx is clear and moist. No oropharyngeal exudate.  Eyes: Conjunctivae are normal. Pupils are equal, round, and reactive to light. No scleral icterus.  Neck: Normal range of motion. Neck supple.  Cardiovascular: Normal rate, regular rhythm and normal heart sounds.  Exam reveals no gallop and no friction rub.   No murmur heard. Pulmonary/Chest: Effort normal and breath sounds normal. She exhibits no tenderness.  Abdominal: Soft. Bowel sounds are normal.  Musculoskeletal: She exhibits edema and tenderness.       Right foot: She exhibits decreased range of motion, tenderness, bony tenderness and swelling.       Feet:  Lymphadenopathy:    She has no cervical adenopathy.  Neurological: She is alert and oriented to person, place, and time. No cranial nerve deficit.  Skin: Skin is warm and dry. There is erythema.       Erythema to MTP joint of right great toe  Psychiatric: She has a normal mood and affect. Her behavior is normal. Judgment and thought content normal.    ED Course  Procedures (including critical care time)  Labs Reviewed - No data to display Dg Foot Complete Right  01/19/2011  *RADIOLOGY REPORT*  Clinical Data: Medial right foot pain and fifth toe pain.  RIGHT FOOT COMPLETE - 3+ VIEW  Comparison: None.  Findings: There is no evidence of fracture or dislocation.  The joint spaces are preserved.  There is no evidence of talar subluxation; the subtalar joint is unremarkable in appearance.  A prominent first toenail is noted.  Small plantar posteromedial spurs are incidentally seen.  The alignment of the foot suggests pes planus.  No significant soft  tissue abnormalities are seen.  IMPRESSION:  1.  No evidence of fracture or dislocation. 2.  Suspect pes planus.  Original Report Authenticated By: Santa Lighter, M.D.     Gout    MDM  Patient with gout noted to right great MTP joint   Medical screening examination/treatment/procedure(s) were performed by non-physician practitioner and as supervising physician I was immediately available for consultation/collaboration. Katy Apo, M.D.     Idalia [place]le [place], [place] 01/19/11 [place], [place] 01/19/11 Seth Ward, MD 01/20/11 [phone]

## 2011-01-19 NOTE — ED Notes (Signed)
The pt has had pain and swelling to her rt foot for 2 days.  No known injury

## 2011-07-29 ENCOUNTER — Ambulatory Visit (INDEPENDENT_AMBULATORY_CARE_PROVIDER_SITE_OTHER): Payer: No Typology Code available for payment source | Admitting: Vascular Surgery

## 2011-07-29 DIAGNOSIS — N186 End stage renal disease: Secondary | ICD-10-CM

## 2011-07-29 NOTE — Progress Notes (Signed)
07/29/11    Pt has now decided to have fistula placed. Procedure scheduled for 08/04/11   Physical exam:  Left upper extremity obese no skin ulcers  Ruta Hinds, MD  [facility] [place]  Office: [phone]  Pager: [phone]

## 2011-08-02 ENCOUNTER — Other Ambulatory Visit: Payer: Self-pay

## 2011-08-03 ENCOUNTER — Encounter (HOSPITAL_COMMUNITY)
Admission: RE | Admit: 2011-08-03 | Discharge: 2011-08-03 | Disposition: A | Discharge status: Home / Self Care | Payer: Self-pay | Source: Ambulatory Visit | Attending: Vascular Surgery | Admitting: Vascular Surgery

## 2011-08-03 ENCOUNTER — Encounter (HOSPITAL_COMMUNITY): Payer: Self-pay

## 2011-08-03 HISTORY — DX: Gout, unspecified: M10.9

## 2011-08-03 HISTORY — DX: Anxiety disorder, unspecified: F41.9

## 2011-08-03 MED ORDER — CEFAZOLIN SODIUM 1-5 GM-% IV SOLN: 1.0000 g | Freq: Once | INTRAVENOUS | Status: DC

## 2011-08-03 MED ORDER — SODIUM CHLORIDE 0.9 % IV SOLN
INTRAVENOUS | Status: DC
  Administered 2011-08-04: 11:00:00 via INTRAVENOUS

## 2011-08-03 MED ORDER — CEFAZOLIN SODIUM-DEXTROSE 2-3 GM-% IV SOLR
2.0000 g | Freq: Once | INTRAVENOUS | Status: DC
  Filled 2011-08-03: qty 50

## 2011-08-03 NOTE — Pre-Procedure Instructions (Signed)
[name]  08/03/2011   Your procedure is scheduled on:  08-04-2011  Report to [facility] at 7:30 AM.  Call this number if you have problems the morning of surgery: [phone]   Remember:   Do not eat food or drink:After Midnight.    .  Take these medicines the morning of surgery with A SIP OF WATER: Allopurinol, Carvedilol(Corgeg)Hydralalazine(Apresoline)   Do not wear jewelry, make-up or nail polish.  Do not wear lotions, powders, or perfumes. You may wear deodorant.  Do not shave 48 hours prior to surgery. Men may shave face and neck.  Do not bring valuables to the hospital.  Contacts, dentures or bridgework may not be worn into surgery.  Leave suitcase in the car. After surgery it may be brought to your room.  For patients admitted to the hospital, checkout time is 11:00 AM the day of discharge.   Patients discharged the day of surgery will not be allowed to drive home.  Name and phone number of your driver:   Special Instructions: CHG Shower Use Special Wash: 1/2 bottle night before surgery and 1/2 bottle morning of surgery.   Please read over the following fact sheets that you were given: Pain Booklet, MRSA Information and Surgical Site Infection Prevention

## 2011-08-04 ENCOUNTER — Encounter (HOSPITAL_COMMUNITY)
Admission: RE | Disposition: A | Discharge status: Home / Self Care | Payer: Self-pay | Source: Ambulatory Visit | Attending: Vascular Surgery

## 2011-08-04 ENCOUNTER — Encounter (HOSPITAL_COMMUNITY): Payer: Self-pay | Admitting: Certified Registered"

## 2011-08-04 ENCOUNTER — Telehealth: Payer: Self-pay | Admitting: Vascular Surgery

## 2011-08-04 ENCOUNTER — Ambulatory Visit (HOSPITAL_COMMUNITY)
Admission: RE | Admit: 2011-08-04 | Discharge: 2011-08-04 | Disposition: A | Discharge status: Home / Self Care | Payer: MEDICAID | Source: Ambulatory Visit | Attending: Vascular Surgery | Admitting: Vascular Surgery

## 2011-08-04 ENCOUNTER — Ambulatory Visit (HOSPITAL_COMMUNITY): Payer: Self-pay | Admitting: Certified Registered"

## 2011-08-04 ENCOUNTER — Encounter (HOSPITAL_COMMUNITY): Payer: Self-pay | Admitting: *Deleted

## 2011-08-04 DIAGNOSIS — I120 Hypertensive chronic kidney disease with stage 5 chronic kidney disease or end stage renal disease: Secondary | ICD-10-CM | POA: Insufficient documentation

## 2011-08-04 DIAGNOSIS — Z01818 Encounter for other preprocedural examination: Secondary | ICD-10-CM | POA: Insufficient documentation

## 2011-08-04 DIAGNOSIS — N183 Chronic kidney disease, stage 3 unspecified: Secondary | ICD-10-CM

## 2011-08-04 DIAGNOSIS — N186 End stage renal disease: Secondary | ICD-10-CM | POA: Insufficient documentation

## 2011-08-04 DIAGNOSIS — Z01810 Encounter for preprocedural cardiovascular examination: Secondary | ICD-10-CM | POA: Insufficient documentation

## 2011-08-04 DIAGNOSIS — Z01812 Encounter for preprocedural laboratory examination: Secondary | ICD-10-CM | POA: Insufficient documentation

## 2011-08-04 DIAGNOSIS — F411 Generalized anxiety disorder: Secondary | ICD-10-CM | POA: Insufficient documentation

## 2011-08-04 HISTORY — PX: AV FISTULA PLACEMENT: SHX1204

## 2011-08-04 LAB — POCT I-STAT 4, (NA,K, GLUC, HGB,HCT)
Glucose, Bld: 99 mg/dL (ref 70–99)
HCT: 35 % — ABNORMAL LOW (ref 36.0–46.0)
Hemoglobin: 11.9 g/dL — ABNORMAL LOW (ref 12.0–15.0)
Potassium: 3.2 mEq/L — ABNORMAL LOW (ref 3.5–5.1)

## 2011-08-04 SURGERY — ARTERIOVENOUS (AV) FISTULA CREATION
Anesthesia: General | Site: Arm Upper | Laterality: Right | Wound class: Clean

## 2011-08-04 MED ORDER — SUCCINYLCHOLINE CHLORIDE 20 MG/ML IJ SOLN
INTRAMUSCULAR | Status: DC | PRN
  Administered 2011-08-04: 140 mg via INTRAVENOUS

## 2011-08-04 MED ORDER — MIDAZOLAM HCL 5 MG/5ML IJ SOLN
INTRAMUSCULAR | Status: DC | PRN
  Administered 2011-08-04: 2 mg via INTRAVENOUS

## 2011-08-04 MED ORDER — CEFAZOLIN SODIUM 1-5 GM-% IV SOLN
INTRAVENOUS | Status: DC | PRN
  Administered 2011-08-04: 2 g via INTRAVENOUS

## 2011-08-04 MED ORDER — HEPARIN SODIUM (PORCINE) 1000 UNIT/ML IJ SOLN
INTRAMUSCULAR | Status: DC | PRN
  Administered 2011-08-04: 7000 [IU] via INTRAVENOUS

## 2011-08-04 MED ORDER — OXYCODONE-ACETAMINOPHEN 5-325 MG PO TABS
ORAL_TABLET | ORAL | Status: AC
  Filled 2011-08-04: qty 1

## 2011-08-04 MED ORDER — OXYCODONE-ACETAMINOPHEN 7.5-325 MG PO TABS: 1.0000 | ORAL_TABLET | ORAL | Status: AC | PRN

## 2011-08-04 MED ORDER — HYDROMORPHONE HCL PF 1 MG/ML IJ SOLN: 0.2500 mg | INTRAMUSCULAR | Status: DC | PRN

## 2011-08-04 MED ORDER — MUPIROCIN 2 % EX OINT
TOPICAL_OINTMENT | Freq: Two times a day (BID) | CUTANEOUS | Status: DC
  Filled 2011-08-04: qty 22

## 2011-08-04 MED ORDER — THROMBIN 20000 UNITS EX SOLR
CUTANEOUS | Status: AC
  Filled 2011-08-04: qty 20000

## 2011-08-04 MED ORDER — 0.9 % SODIUM CHLORIDE (POUR BTL) OPTIME
TOPICAL | Status: DC | PRN
  Administered 2011-08-04: 1000 mL

## 2011-08-04 MED ORDER — EPHEDRINE SULFATE 50 MG/ML IJ SOLN
INTRAMUSCULAR | Status: DC | PRN
  Administered 2011-08-04 (×2): 10 mg via INTRAVENOUS

## 2011-08-04 MED ORDER — MUPIROCIN 2 % EX OINT
TOPICAL_OINTMENT | CUTANEOUS | Status: AC
  Administered 2011-08-04: 1 via NASAL
  Filled 2011-08-04: qty 22

## 2011-08-04 MED ORDER — ONDANSETRON HCL 4 MG/2ML IJ SOLN
INTRAMUSCULAR | Status: DC | PRN
  Administered 2011-08-04: 4 mg via INTRAVENOUS

## 2011-08-04 MED ORDER — FENTANYL CITRATE 0.05 MG/ML IJ SOLN
INTRAMUSCULAR | Status: DC | PRN
  Administered 2011-08-04: 150 ug via INTRAVENOUS
  Administered 2011-08-04: 100 ug via INTRAVENOUS

## 2011-08-04 MED ORDER — PROPOFOL 10 MG/ML IV EMUL
INTRAVENOUS | Status: DC | PRN
  Administered 2011-08-04: 300 mg via INTRAVENOUS

## 2011-08-04 MED ORDER — SODIUM CHLORIDE 0.9 % IR SOLN
Status: DC | PRN
  Administered 2011-08-04: 13:00:00

## 2011-08-04 MED ORDER — OXYCODONE-ACETAMINOPHEN 5-325 MG PO TABS
1.0000 | ORAL_TABLET | Freq: Once | ORAL | Status: AC
  Administered 2011-08-04: 1 via ORAL

## 2011-08-04 MED ORDER — SODIUM CHLORIDE 0.9 % IV SOLN
INTRAVENOUS | Status: DC | PRN
  Administered 2011-08-04: 13:00:00 via INTRAVENOUS

## 2011-08-04 SURGICAL SUPPLY — 45 items
ADH SKN CLS APL DERMABOND .7 (GAUZE/BANDAGES/DRESSINGS) ×1
ADH SKN CLS LQ APL DERMABOND (GAUZE/BANDAGES/DRESSINGS) ×1
CANISTER SUCTION 2500CC (MISCELLANEOUS) ×2 IMPLANT
CLIP TI MEDIUM 6 (CLIP) ×2 IMPLANT
CLIP TI WIDE RED SMALL 6 (CLIP) ×2 IMPLANT
CLOTH BEACON ORANGE TIMEOUT ST (SAFETY) ×2 IMPLANT
COVER PROBE W GEL 5X96 (DRAPES) ×2 IMPLANT
COVER SURGICAL LIGHT HANDLE (MISCELLANEOUS) ×2 IMPLANT
DECANTER SPIKE VIAL GLASS SM (MISCELLANEOUS) ×1 IMPLANT
DERMABOND ADHESIVE PROPEN (GAUZE/BANDAGES/DRESSINGS) ×1
DERMABOND ADVANCED (GAUZE/BANDAGES/DRESSINGS) ×1
DERMABOND ADVANCED .7 DNX12 (GAUZE/BANDAGES/DRESSINGS) ×1 IMPLANT
DERMABOND ADVANCED .7 DNX6 (GAUZE/BANDAGES/DRESSINGS) IMPLANT
DRAIN PENROSE 1/4X12 LTX STRL (WOUND CARE) ×2 IMPLANT
ELECT REM PT RETURN 9FT ADLT (ELECTROSURGICAL) ×2
ELECTRODE REM PT RTRN 9FT ADLT (ELECTROSURGICAL) ×1 IMPLANT
GAUZE SPONGE 2X2 8PLY STRL LF (GAUZE/BANDAGES/DRESSINGS) ×1 IMPLANT
GEL ULTRASOUND 20GR AQUASONIC (MISCELLANEOUS) IMPLANT
GLOVE BIO SURGEON STRL SZ 6.5 (GLOVE) ×1 IMPLANT
GLOVE BIO SURGEON STRL SZ7.5 (GLOVE) ×2 IMPLANT
GLOVE BIOGEL PI IND STRL 6.5 (GLOVE) IMPLANT
GLOVE BIOGEL PI IND STRL 7.5 (GLOVE) IMPLANT
GLOVE BIOGEL PI INDICATOR 6.5 (GLOVE) ×2
GLOVE BIOGEL PI INDICATOR 7.5 (GLOVE) ×1
GLOVE SURG SS PI 7.5 STRL IVOR (GLOVE) ×1 IMPLANT
GOWN PREVENTION PLUS XLARGE (GOWN DISPOSABLE) ×2 IMPLANT
GOWN STRL NON-REIN LRG LVL3 (GOWN DISPOSABLE) ×4 IMPLANT
HOVERMATT SINGLE USE (MISCELLANEOUS) ×1 IMPLANT
KIT BASIN OR (CUSTOM PROCEDURE TRAY) ×2 IMPLANT
KIT ROOM TURNOVER OR (KITS) ×2 IMPLANT
LOOP VESSEL MINI RED (MISCELLANEOUS) IMPLANT
NS IRRIG 1000ML POUR BTL (IV SOLUTION) ×2 IMPLANT
PACK CV ACCESS (CUSTOM PROCEDURE TRAY) ×2 IMPLANT
PAD ARMBOARD 7.5X6 YLW CONV (MISCELLANEOUS) ×4 IMPLANT
SPONGE GAUZE 2X2 STER 10/PKG (GAUZE/BANDAGES/DRESSINGS) ×1
SPONGE SURGIFOAM ABS GEL 100 (HEMOSTASIS) IMPLANT
SUT PROLENE 6 0 CC (SUTURE) ×1 IMPLANT
SUT PROLENE 7 0 BV 1 (SUTURE) ×2 IMPLANT
SUT VIC AB 3-0 SH 27 (SUTURE) ×2
SUT VIC AB 3-0 SH 27X BRD (SUTURE) ×1 IMPLANT
SUT VICRYL 4-0 PS2 18IN ABS (SUTURE) ×2 IMPLANT
TOWEL OR 17X24 6PK STRL BLUE (TOWEL DISPOSABLE) ×2 IMPLANT
TOWEL OR 17X26 10 PK STRL BLUE (TOWEL DISPOSABLE) ×2 IMPLANT
UNDERPAD 30X30 INCONTINENT (UNDERPADS AND DIAPERS) ×2 IMPLANT
WATER STERILE IRR 1000ML POUR (IV SOLUTION) ×2 IMPLANT

## 2011-08-04 NOTE — H&P (View-Only) (Signed)
07/29/11    Pt has now decided to have fistula placed. Procedure scheduled for 08/04/11   Physical exam:  Left upper extremity obese no skin ulcers  Ruta Hinds, MD  [facility] [place]  Office: [phone]  Pager: [phone]

## 2011-08-04 NOTE — Progress Notes (Signed)
Patient meets discharge criteria. Waiting on additional family member to be ready for discharge prior to d/c patient

## 2011-08-04 NOTE — Op Note (Signed)
Procedure: Rightt Brachial Cephalic AV fistula  Preop: ESRD  Postop: ESRD  Anesthesia: General  Assistant:Jesse Shear RNFA  Findings: 3 mm cephalic vein  Procedure: After obtaining informed consent, the patient was taken to the operating room.  After induction of general anesthesia, the right upper extremity was prepped and draped in usual sterile fashion.  A transverse incision was then made near the antecubital crease the right arm. The incision was carried into the subcutaneous tissues down to level of the cephalic vein. The cephalic vein was approximately 3 mm in diameter. It was of good quality. This was dissected free circumferentially and small side branches ligated and divided between silk ties or clips. Next the brachial artery was dissected free in the medial portion of the incision. The artery was  3-4 mm in diameter. There was some spasm.  The vessel loops were placed proximal and distal to the planned site of arteriotomy. The patient was given 7000 units of intravenous heparin. After appropriate circulation time, the vessel loops were used to control the artery. A longitudinal opening was made in the brachial artery.  The vein was ligated distally with a 2-0 silk tie. The vein was controlled proximally with a fine bulldog clamp. The vein was then swung over to the artery and sewn end of vein to side of artery using a running 7-0 Prolene suture. Just prior to completion of the anastomosis, everything was fore bled back bled and thoroughly flushed. The anastomosis was secured, vessel loops released, and there was a palpable thrill in the fistula immediately. After hemostasis was obtained, the subcutaneous tissues were reapproximated using a running 3-0 Vicryl suture. The skin was then closed with a 4 Vicryl subcuticular stitch. Dermabond was applied to the skin incision.  The patient had a palpable radial pulse at the end of the case.  Ruta Hinds, MD [facility]  [place] Office: [phone] Pager: [phone]

## 2011-08-04 NOTE — Telephone Encounter (Signed)
LVM for patient regarding appt for follow up on 09/16/11

## 2011-08-04 NOTE — Anesthesia Procedure Notes (Signed)
Procedure Name: Intubation Date/Time: 08/04/2011 1:06 PM Performed by: Jon Gills A Pre-anesthesia Checklist: Patient identified, Emergency Drugs available, Suction available and Patient being monitored Patient Re-evaluated:Patient Re-evaluated prior to inductionOxygen Delivery Method: Circle system utilized Preoxygenation: Pre-oxygenation with 100% oxygen Intubation Type: IV induction and Rapid sequence Tube size: 7.0 mm Number of attempts: 1 Airway Equipment and Method: Video-laryngoscopy Placement Confirmation: ETT inserted through vocal cords under direct vision,  positive ETCO2 and breath sounds checked- equal and bilateral Secured at: 22 cm Tube secured with: Tape Dental Injury: Teeth and Oropharynx as per pre-operative assessment

## 2011-08-04 NOTE — Interval H&P Note (Signed)
History and Physical Interval Note:  08/04/2011 12:12 PM  [name]  has presented today for surgery, with the diagnosis of End Stage Renal Disease  The various methods of treatment have been discussed with the patient and family. After consideration of risks, benefits and other options for treatment, the patient has consented to  Procedure(s) (LRB): ARTERIOVENOUS (AV) FISTULA CREATION (Right) as a surgical intervention .  The patient's history has been reviewed, patient examined, no change in status, stable for surgery.  I have reviewed the patient's chart and labs.  Questions were answered to the patient's satisfaction.     Ciin Brazzel E

## 2011-08-04 NOTE — Transfer of Care (Signed)
Immediate Anesthesia Transfer of Care Note  Patient: [name]  Procedure(s) Performed: Procedure(s) (LRB): ARTERIOVENOUS (AV) FISTULA CREATION (Right)  Patient Location: PACU  Anesthesia Type: General  Level of Consciousness: awake, alert  and patient cooperative  Airway & Oxygen Therapy: Patient Spontanous Breathing and Patient connected to face mask oxygen  Post-op Assessment: Report given to PACU RN  Post vital signs: Reviewed and stable  Complications: No apparent anesthesia complications

## 2011-08-04 NOTE — Interval H&P Note (Signed)
History and Physical Interval Note:  08/04/2011 12:20 PM  [facility] SHORT STAY H&P  CC:  Needs access  HPI: Needs hemodialysis access  Past Medical History  Diagnosis Date  . Hypertension   . Thyroid disease   . Morbid obesity   . Hyperlipidemia   . History of pneumonia   . Foot fracture   . Dilated cardiomyopathy   . Chronic kidney disease   . Anxiety   . Pneumonia     hosp. 2010  . CHF (congestive heart failure)     hosp. 2011  . Gout     FH:  Non-Contributory  Social HX History  Substance Use Topics  . Smoking status: Never Smoker   . Smokeless tobacco: Not on file  . Alcohol Use: Yes     rare use    Allergies No Known Allergies  Medications Current Facility-Administered Medications  Medication Dose Route Frequency Provider Last Rate Last Dose  . 0.9 %  sodium chloride infusion   Intravenous Continuous Elam Dutch, MD 20 mL/hr at 08/04/11 1129    . ceFAZolin (ANCEF) IVPB 2 g/50 mL premix  2 g Intravenous Once Elam Dutch, MD      . mupirocin ointment (BACTROBAN) 2 %   Nasal BID Elam Dutch, MD      . mupirocin ointment (BACTROBAN) 2 %        1 application at [id number]    PHYSICAL EXAM  Filed Vitals:   08/04/11 0748  BP: 143/78  Pulse: 73  Temp: 98.4 F (36.9 C)  Resp: 20    General:  WDWN in NAD HENT: WNL Eyes: Pupils equal Pulmonary: normal non-labored breathing , without Rales, rhonchi,  wheezing Cardiac: RRR, Vascular Exam/Pulses:  2+ brachial and radial obese arms  Extremities without ischemic changes, no Gangrene , no cellulitis; no open wounds;  Neuro A&O x 3; good sensation; motion in all extremities  Impression: Needs access  Plan: Right arm AV fistula today  [name] @TODAY @ 12:20 PM    [name]  has presented today for surgery, with the diagnosis of End Stage Renal Disease  The various methods of treatment have been discussed with the patient and family. After  consideration of risks, benefits and other options for treatment, the patient has consented to  Procedure(s) (LRB): ARTERIOVENOUS (AV) FISTULA CREATION (Right) as a surgical intervention .  The patient's history has been reviewed, patient examined, no change in status, stable for surgery.  I have reviewed the patient's chart and labs.  Questions were answered to the patient's satisfaction.     [name] E

## 2011-08-04 NOTE — Anesthesia Preprocedure Evaluation (Signed)
Anesthesia Evaluation  Patient identified by MRN, date of birth, ID band Patient awake    Reviewed: Allergy & Precautions, H&P , NPO status , Patient's Chart, lab work & pertinent test results  Airway Mallampati: II      Dental   Pulmonary pneumonia -,  breath sounds clear to auscultation        Cardiovascular hypertension, +CHF Rhythm:Regular Rate:Normal     Neuro/Psych Anxiety    GI/Hepatic negative GI ROS, Neg liver ROS,   Endo/Other  negative endocrine ROS  Renal/GU negative Renal ROS     Musculoskeletal   Abdominal   Peds  Hematology negative hematology ROS (+)   Anesthesia Other Findings   Reproductive/Obstetrics                           Anesthesia Physical Anesthesia Plan  ASA: III  Anesthesia Plan: General   Post-op Pain Management:    Induction: Intravenous  Airway Management Planned: Oral ETT  Additional Equipment:   Intra-op Plan:   Post-operative Plan: Extubation in OR  Informed Consent: I have reviewed the patients History and Physical, chart, labs and discussed the procedure including the risks, benefits and alternatives for the proposed anesthesia with the patient or authorized representative who has indicated his/her understanding and acceptance.   Dental advisory given  Plan Discussed with: CRNA, Anesthesiologist and Surgeon  Anesthesia Plan Comments:         Anesthesia Quick Evaluation

## 2011-08-04 NOTE — Preoperative (Signed)
Beta Blockers   Reason not to administer Beta Blockers:Not Applicable 

## 2011-08-04 NOTE — Anesthesia Postprocedure Evaluation (Signed)
  Anesthesia Post-op Note  Patient: [name]  Procedure(s) Performed: Procedure(s) (LRB): ARTERIOVENOUS (AV) FISTULA CREATION (Right)  Patient Location: PACU  Anesthesia Type: General  Level of Consciousness: awake  Airway and Oxygen Therapy: Patient Spontanous Breathing  Post-op Pain: mild  Post-op Assessment: Post-op Vital signs reviewed  Post-op Vital Signs: Reviewed  Complications: No apparent anesthesia complications

## 2011-08-04 NOTE — Telephone Encounter (Signed)
Message copied by Gena Fray on Wed Aug 04, 2011  2:50 PM ------      Message from: [place]: Wed Aug 04, 2011  2:31 PM       Right brachial cephalic AVF, nurse asst      Needs follow up in 6 weeks            Juanda Crumble

## 2011-08-04 NOTE — Progress Notes (Signed)
Report given to Melanie,RN 

## 2011-08-04 NOTE — Procedures (Signed)
Dr Oneida Alar aware pt has tingling in rt arm.  States this is ok, and that pt may cont to be d/c home.

## 2011-08-05 ENCOUNTER — Encounter (HOSPITAL_COMMUNITY): Payer: Self-pay | Admitting: Vascular Surgery

## 2011-09-10 ENCOUNTER — Other Ambulatory Visit: Payer: Self-pay | Admitting: *Deleted

## 2011-09-10 DIAGNOSIS — Z4931 Encounter for adequacy testing for hemodialysis: Secondary | ICD-10-CM

## 2011-09-10 DIAGNOSIS — N186 End stage renal disease: Secondary | ICD-10-CM

## 2011-09-15 ENCOUNTER — Encounter: Payer: Self-pay | Admitting: Vascular Surgery

## 2011-09-16 ENCOUNTER — Ambulatory Visit (INDEPENDENT_AMBULATORY_CARE_PROVIDER_SITE_OTHER): Payer: Self-pay | Admitting: Vascular Surgery

## 2011-09-16 ENCOUNTER — Encounter: Payer: Self-pay | Admitting: Vascular Surgery

## 2011-09-16 ENCOUNTER — Encounter (INDEPENDENT_AMBULATORY_CARE_PROVIDER_SITE_OTHER): Payer: Self-pay | Admitting: *Deleted

## 2011-09-16 VITALS — BP 182/87 | HR 72 | Resp 16 | Ht 69.5 in | Wt 362.0 lb

## 2011-09-16 DIAGNOSIS — N186 End stage renal disease: Secondary | ICD-10-CM

## 2011-09-16 DIAGNOSIS — Z4931 Encounter for adequacy testing for hemodialysis: Secondary | ICD-10-CM

## 2011-09-16 DIAGNOSIS — T82898A Other specified complication of vascular prosthetic devices, implants and grafts, initial encounter: Secondary | ICD-10-CM

## 2011-09-16 NOTE — Progress Notes (Signed)
Patient is a 36 year old female who underwent right brachiocephalic AV fistula placement approximate 6 weeks ago. She returns for followup today. She is currently not on dialysis. She denies any numbness tingling or aching in her right hand. She is able to check for a thrill daily.  Physical exam:  Filed Vitals:   09/16/11 1600  BP: 182/87  Pulse: 72  Resp: 16  Height: 5' 9.5" (1.765 m)  Weight: 362 lb (164.202 kg)  SpO2: 100%   Right upper extremity: Well-healed antecubital incision easily palpable thrill fistula is deep in the upper arm  Data: Duplex of AV fistula was performed today. There may be some narrowing of the proximal portion and a retained valve. Most of the fistula 7-8 mm in diameter. However it is greater than a centimeter in depth throughout most of its course.  Assessment: Maturing AV fistula right arm. She may have some proximal narrowing of the fistula the thrill is easily palpable today.  Plan: Followup in 6 weeks with repeat duplex of her AV fistula. At that time we will consider Superficialization of the fistula. We will also reevaluate the proximal portion of the fistula and consider whether or not this needs revision.  Ruta Hinds, MD [facility] [place] Office: [phone] Pager: [phone]

## 2011-10-21 ENCOUNTER — Other Ambulatory Visit: Payer: Self-pay | Admitting: *Deleted

## 2011-10-21 DIAGNOSIS — Z4932 Encounter for adequacy testing for peritoneal dialysis: Secondary | ICD-10-CM

## 2011-10-21 DIAGNOSIS — N186 End stage renal disease: Secondary | ICD-10-CM

## 2011-10-27 ENCOUNTER — Encounter: Payer: Self-pay | Admitting: Vascular Surgery

## 2011-10-28 ENCOUNTER — Ambulatory Visit (INDEPENDENT_AMBULATORY_CARE_PROVIDER_SITE_OTHER): Payer: Self-pay | Admitting: Vascular Surgery

## 2011-10-28 ENCOUNTER — Ambulatory Visit: Payer: Self-pay | Admitting: Vascular Surgery

## 2011-10-28 ENCOUNTER — Encounter (INDEPENDENT_AMBULATORY_CARE_PROVIDER_SITE_OTHER): Payer: Self-pay | Admitting: *Deleted

## 2011-10-28 ENCOUNTER — Encounter: Payer: Self-pay | Admitting: Vascular Surgery

## 2011-10-28 VITALS — BP 169/86 | HR 70 | Ht 69.5 in | Wt 360.0 lb

## 2011-10-28 DIAGNOSIS — N186 End stage renal disease: Secondary | ICD-10-CM

## 2011-10-28 DIAGNOSIS — T82598A Other mechanical complication of other cardiac and vascular devices and implants, initial encounter: Secondary | ICD-10-CM

## 2011-10-28 DIAGNOSIS — Z4932 Encounter for adequacy testing for peritoneal dialysis: Secondary | ICD-10-CM

## 2011-10-28 DIAGNOSIS — Z48812 Encounter for surgical aftercare following surgery on the circulatory system: Secondary | ICD-10-CM

## 2011-10-28 NOTE — Progress Notes (Signed)
[facility] [place] HISTORY AND PHYSICAL    History of Present Illness:  Patient is a 36 y.o. year old female who presents for post-operative follow-up after placement of a right brachiocephalic AV fistula July 31. She is currently not on dialysis. She denies any numbness or tingling in her hand.   Review of systems. The patient denies any skin itching. She denies any shortness of breath. She denies any chest pain.  Physical Examination  Filed Vitals:   10/28/11 1214  BP: 169/86  Pulse: 70  Height: 5' 9.5" (1.765 m)  Weight: 360 lb (163.295 kg)  SpO2: 96%  General:  Alert and oriented, no acute distress Neck: No bruit or JVD Skin: No rash Extremities:  Palpable thrill in fistula, the fistula is difficult to palpate in the upper arm Neurologic: Upper and lower extremity motor 5/5 and symmetric  DATA: The patient had a duplex ultrasound of her AV fistula today which I reviewed and interpreted. The fistula is 9-10 mm throughout most of its course. The proximal perianastomotic area of the fistula is 3 mm in diameter. There is an adjacent larger cephalic vein branch.   ASSESSMENT: Non-maturing AV fistula primarily secondary to depth of vein.   PLAN:  Superficialization of right arm AV fistula and possible revision to larger branch of cephalic vein on November 25. The patient wished to delay things for a few weeks to do a very ill aunt.   Ruta Hinds, MD [facility] [place] Office: [phone] Pager: [phone]

## 2011-11-25 ENCOUNTER — Encounter (HOSPITAL_COMMUNITY): Payer: Self-pay | Admitting: Respiratory Therapy

## 2011-11-25 ENCOUNTER — Other Ambulatory Visit: Payer: Self-pay | Admitting: *Deleted

## 2011-12-07 ENCOUNTER — Encounter (HOSPITAL_COMMUNITY): Admission: RE | Payer: Self-pay | Source: Ambulatory Visit

## 2011-12-07 ENCOUNTER — Ambulatory Visit (HOSPITAL_COMMUNITY): Admission: RE | Admit: 2011-12-07 | Payer: Self-pay | Source: Ambulatory Visit | Admitting: Vascular Surgery

## 2011-12-07 SURGERY — FISTULA SUPERFICIALIZATION
Anesthesia: Choice | Site: Arm Upper | Laterality: Right

## 2013-01-27 ENCOUNTER — Encounter (HOSPITAL_COMMUNITY): Payer: Self-pay | Admitting: Emergency Medicine

## 2013-01-27 ENCOUNTER — Emergency Department (HOSPITAL_COMMUNITY): Payer: BC Managed Care – PPO

## 2013-01-27 ENCOUNTER — Inpatient Hospital Stay (HOSPITAL_COMMUNITY)
Admission: EM | Admit: 2013-01-27 | Discharge: 2013-01-30 | DRG: 811 | Disposition: A | Discharge status: Home / Self Care | Payer: BC Managed Care – PPO | Attending: Internal Medicine | Admitting: Internal Medicine

## 2013-01-27 DIAGNOSIS — E782 Mixed hyperlipidemia: Secondary | ICD-10-CM | POA: Diagnosis present

## 2013-01-27 DIAGNOSIS — I428 Other cardiomyopathies: Secondary | ICD-10-CM | POA: Diagnosis present

## 2013-01-27 DIAGNOSIS — I82619 Acute embolism and thrombosis of superficial veins of unspecified upper extremity: Secondary | ICD-10-CM | POA: Diagnosis present

## 2013-01-27 DIAGNOSIS — N179 Acute kidney failure, unspecified: Secondary | ICD-10-CM | POA: Diagnosis present

## 2013-01-27 DIAGNOSIS — D649 Anemia, unspecified: Secondary | ICD-10-CM | POA: Diagnosis present

## 2013-01-27 DIAGNOSIS — Y832 Surgical operation with anastomosis, bypass or graft as the cause of abnormal reaction of the patient, or of later complication, without mention of misadventure at the time of the procedure: Secondary | ICD-10-CM | POA: Diagnosis present

## 2013-01-27 DIAGNOSIS — N039 Chronic nephritic syndrome with unspecified morphologic changes: Secondary | ICD-10-CM

## 2013-01-27 DIAGNOSIS — N186 End stage renal disease: Secondary | ICD-10-CM | POA: Diagnosis present

## 2013-01-27 DIAGNOSIS — I120 Hypertensive chronic kidney disease with stage 5 chronic kidney disease or end stage renal disease: Secondary | ICD-10-CM | POA: Diagnosis present

## 2013-01-27 DIAGNOSIS — N183 Chronic kidney disease, stage 3 unspecified: Secondary | ICD-10-CM

## 2013-01-27 DIAGNOSIS — D509 Iron deficiency anemia, unspecified: Principal | ICD-10-CM | POA: Diagnosis present

## 2013-01-27 DIAGNOSIS — Z833 Family history of diabetes mellitus: Secondary | ICD-10-CM

## 2013-01-27 DIAGNOSIS — E785 Hyperlipidemia, unspecified: Secondary | ICD-10-CM

## 2013-01-27 DIAGNOSIS — I959 Hypotension, unspecified: Secondary | ICD-10-CM | POA: Diagnosis present

## 2013-01-27 DIAGNOSIS — I420 Dilated cardiomyopathy: Secondary | ICD-10-CM | POA: Diagnosis present

## 2013-01-27 DIAGNOSIS — I509 Heart failure, unspecified: Secondary | ICD-10-CM | POA: Diagnosis present

## 2013-01-27 DIAGNOSIS — D72829 Elevated white blood cell count, unspecified: Secondary | ICD-10-CM | POA: Diagnosis present

## 2013-01-27 DIAGNOSIS — R55 Syncope and collapse: Secondary | ICD-10-CM

## 2013-01-27 DIAGNOSIS — I080 Rheumatic disorders of both mitral and aortic valves: Secondary | ICD-10-CM

## 2013-01-27 DIAGNOSIS — I10 Essential (primary) hypertension: Secondary | ICD-10-CM | POA: Diagnosis present

## 2013-01-27 DIAGNOSIS — N185 Chronic kidney disease, stage 5: Secondary | ICD-10-CM | POA: Diagnosis present

## 2013-01-27 DIAGNOSIS — Z8249 Family history of ischemic heart disease and other diseases of the circulatory system: Secondary | ICD-10-CM

## 2013-01-27 DIAGNOSIS — F411 Generalized anxiety disorder: Secondary | ICD-10-CM | POA: Diagnosis present

## 2013-01-27 DIAGNOSIS — D631 Anemia in chronic kidney disease: Secondary | ICD-10-CM | POA: Diagnosis present

## 2013-01-27 DIAGNOSIS — M109 Gout, unspecified: Secondary | ICD-10-CM | POA: Diagnosis present

## 2013-01-27 DIAGNOSIS — N920 Excessive and frequent menstruation with regular cycle: Secondary | ICD-10-CM | POA: Diagnosis present

## 2013-01-27 DIAGNOSIS — Z841 Family history of disorders of kidney and ureter: Secondary | ICD-10-CM

## 2013-01-27 DIAGNOSIS — N189 Chronic kidney disease, unspecified: Secondary | ICD-10-CM

## 2013-01-27 DIAGNOSIS — Z6841 Body Mass Index (BMI) 40.0 and over, adult: Secondary | ICD-10-CM

## 2013-01-27 DIAGNOSIS — I5021 Acute systolic (congestive) heart failure: Secondary | ICD-10-CM

## 2013-01-27 DIAGNOSIS — T82898A Other specified complication of vascular prosthetic devices, implants and grafts, initial encounter: Secondary | ICD-10-CM | POA: Diagnosis present

## 2013-01-27 HISTORY — DX: Syncope and collapse: R55

## 2013-01-27 LAB — CBC
HEMATOCRIT: 13.8 % — AB (ref 36.0–46.0)
Hemoglobin: 4.3 g/dL — CL (ref 12.0–15.0)
MCH: 25.1 pg — ABNORMAL LOW (ref 26.0–34.0)
MCHC: 31.2 g/dL (ref 30.0–36.0)
MCV: 80.7 fL (ref 78.0–100.0)
PLATELETS: 260 10*3/uL (ref 150–400)
RBC: 1.71 MIL/uL — AB (ref 3.87–5.11)
RDW: 16.1 % — ABNORMAL HIGH (ref 11.5–15.5)
WBC: 14.8 10*3/uL — ABNORMAL HIGH (ref 4.0–10.5)

## 2013-01-27 LAB — CBC WITH DIFFERENTIAL/PLATELET
BASOS PCT: 0 % (ref 0–1)
Basophils Absolute: 0 10*3/uL (ref 0.0–0.1)
EOS ABS: 0.8 10*3/uL — AB (ref 0.0–0.7)
Eosinophils Relative: 5 % (ref 0–5)
HEMATOCRIT: 12.5 % — AB (ref 36.0–46.0)
Hemoglobin: 3.7 g/dL — CL (ref 12.0–15.0)
Lymphocytes Relative: 17 % (ref 12–46)
Lymphs Abs: 2.6 10*3/uL (ref 0.7–4.0)
MCH: 24 pg — AB (ref 26.0–34.0)
MCHC: 29.6 g/dL — AB (ref 30.0–36.0)
MCV: 81.2 fL (ref 78.0–100.0)
MONO ABS: 1 10*3/uL (ref 0.1–1.0)
Monocytes Relative: 6 % (ref 3–12)
Neutro Abs: 11.4 10*3/uL — ABNORMAL HIGH (ref 1.7–7.7)
Neutrophils Relative %: 72 % (ref 43–77)
Platelets: 279 10*3/uL (ref 150–400)
RBC: 1.54 MIL/uL — ABNORMAL LOW (ref 3.87–5.11)
RDW: 16.8 % — AB (ref 11.5–15.5)
WBC: 15.7 10*3/uL — ABNORMAL HIGH (ref 4.0–10.5)

## 2013-01-27 LAB — COMPREHENSIVE METABOLIC PANEL
ALBUMIN: 2.9 g/dL — AB (ref 3.5–5.2)
ALT: 5 U/L (ref 0–35)
AST: 7 U/L (ref 0–37)
Alkaline Phosphatase: 36 U/L — ABNORMAL LOW (ref 39–117)
BUN: 42 mg/dL — AB (ref 6–23)
CALCIUM: 8.6 mg/dL (ref 8.4–10.5)
CO2: 26 mEq/L (ref 19–32)
CREATININE: 6.44 mg/dL — AB (ref 0.50–1.10)
Chloride: 102 mEq/L (ref 96–112)
GFR calc Af Amer: 9 mL/min — ABNORMAL LOW (ref >90)
GFR calc non Af Amer: 7 mL/min — ABNORMAL LOW (ref >90)
Glucose, Bld: 125 mg/dL — ABNORMAL HIGH (ref 70–99)
Potassium: 4.3 mEq/L (ref 3.7–5.3)
Sodium: 139 mEq/L (ref 137–147)
TOTAL PROTEIN: 6.5 g/dL (ref 6.0–8.3)
Total Bilirubin: <0.2 mg/dL — ABNORMAL LOW (ref 0.3–1.2)

## 2013-01-27 LAB — HEMOGLOBIN AND HEMATOCRIT, BLOOD
HCT: 14.7 % — ABNORMAL LOW (ref 36.0–46.0)
Hemoglobin: 4.8 g/dL — CL (ref 12.0–15.0)

## 2013-01-27 LAB — URINALYSIS, ROUTINE W REFLEX MICROSCOPIC
Bilirubin Urine: NEGATIVE
Glucose, UA: NEGATIVE mg/dL
Ketones, ur: NEGATIVE mg/dL
Nitrite: NEGATIVE
PROTEIN: 100 mg/dL — AB
Specific Gravity, Urine: 1.013 (ref 1.005–1.030)
UROBILINOGEN UA: 0.2 mg/dL (ref 0.0–1.0)
pH: 5.5 (ref 5.0–8.0)

## 2013-01-27 LAB — BASIC METABOLIC PANEL
BUN: 42 mg/dL — ABNORMAL HIGH (ref 6–23)
BUN: 45 mg/dL — ABNORMAL HIGH (ref 6–23)
BUN: 49 mg/dL — ABNORMAL HIGH (ref 6–23)
CALCIUM: 9 mg/dL (ref 8.4–10.5)
CALCIUM: 8.4 mg/dL (ref 8.4–10.5)
CHLORIDE: 103 meq/L (ref 96–112)
CO2: 24 mEq/L (ref 19–32)
CO2: 21 meq/L (ref 19–32)
CO2: 25 meq/L (ref 19–32)
Calcium: 8.9 mg/dL (ref 8.4–10.5)
Chloride: 101 mEq/L (ref 96–112)
Chloride: 103 mEq/L (ref 96–112)
Creatinine, Ser: 6.08 mg/dL — ABNORMAL HIGH (ref 0.50–1.10)
Creatinine, Ser: 6.24 mg/dL — ABNORMAL HIGH (ref 0.50–1.10)
Creatinine, Ser: 6.42 mg/dL — ABNORMAL HIGH (ref 0.50–1.10)
GFR calc Af Amer: 9 mL/min — ABNORMAL LOW (ref >90)
GFR calc Af Amer: 9 mL/min — ABNORMAL LOW (ref >90)
GFR calc non Af Amer: 8 mL/min — ABNORMAL LOW (ref >90)
GFR calc non Af Amer: 8 mL/min — ABNORMAL LOW (ref >90)
GFR calc non Af Amer: 7 mL/min — ABNORMAL LOW (ref >90)
GFR, EST AFRICAN AMERICAN: 9 mL/min — AB (ref >90)
Glucose, Bld: 123 mg/dL — ABNORMAL HIGH (ref 70–99)
Glucose, Bld: 125 mg/dL — ABNORMAL HIGH (ref 70–99)
Glucose, Bld: 106 mg/dL — ABNORMAL HIGH (ref 70–99)
Potassium: 4.7 mEq/L (ref 3.7–5.3)
Potassium: 4.2 mEq/L (ref 3.7–5.3)
Potassium: 4.1 mEq/L (ref 3.7–5.3)
SODIUM: 140 meq/L (ref 137–147)
SODIUM: 138 meq/L (ref 137–147)
SODIUM: 140 meq/L (ref 137–147)

## 2013-01-27 LAB — TSH: TSH: 0.182 u[IU]/mL — ABNORMAL LOW (ref 0.350–4.500)

## 2013-01-27 LAB — ABO/RH
ABO/RH(D): A POS
ABO/RH(D): A POS

## 2013-01-27 LAB — POCT I-STAT, CHEM 8
BUN: 46 mg/dL — ABNORMAL HIGH (ref 6–23)
CHLORIDE: 103 meq/L (ref 96–112)
Calcium, Ion: 1.22 mmol/L (ref 1.12–1.23)
Creatinine, Ser: 6.5 mg/dL — ABNORMAL HIGH (ref 0.50–1.10)
Glucose, Bld: 117 mg/dL — ABNORMAL HIGH (ref 70–99)
HEMATOCRIT: 13 % — AB (ref 36.0–46.0)
Hemoglobin: 4.4 g/dL — CL (ref 12.0–15.0)
Potassium: 4 mEq/L (ref 3.7–5.3)
Sodium: 140 mEq/L (ref 137–147)
TCO2: 25 mmol/L (ref 0–100)

## 2013-01-27 LAB — IRON AND TIBC
Iron: 39 ug/dL — ABNORMAL LOW (ref 42–135)
Saturation Ratios: 11 % — ABNORMAL LOW (ref 20–55)
TIBC: 346 ug/dL (ref 250–470)
UIBC: 307 ug/dL (ref 125–400)

## 2013-01-27 LAB — MRSA PCR SCREENING: MRSA BY PCR: NEGATIVE

## 2013-01-27 LAB — GLUCOSE, CAPILLARY: GLUCOSE-CAPILLARY: 133 mg/dL — AB (ref 70–99)

## 2013-01-27 LAB — URINE MICROSCOPIC-ADD ON

## 2013-01-27 LAB — HCG, SERUM, QUALITATIVE: PREG SERUM: NEGATIVE

## 2013-01-27 LAB — TROPONIN I

## 2013-01-27 LAB — PREPARE RBC (CROSSMATCH)

## 2013-01-27 LAB — FERRITIN: FERRITIN: 2 ng/mL — AB (ref 10–291)

## 2013-01-27 MED ORDER — FUROSEMIDE 80 MG PO TABS
80.0000 mg | ORAL_TABLET | Freq: Two times a day (BID) | ORAL | Status: DC
  Administered 2013-01-27 – 2013-01-30 (×7): 80 mg via ORAL
  Filled 2013-01-27 (×10): qty 1

## 2013-01-27 MED ORDER — ALLOPURINOL 100 MG PO TABS
100.0000 mg | ORAL_TABLET | Freq: Every day | ORAL | Status: DC
  Administered 2013-01-27: 100 mg via ORAL
  Filled 2013-01-27: qty 1

## 2013-01-27 MED ORDER — CALCITRIOL 0.25 MCG PO CAPS
0.2500 ug | ORAL_CAPSULE | ORAL | Status: DC
  Administered 2013-01-29: 0.25 ug via ORAL
  Filled 2013-01-27 (×2): qty 1

## 2013-01-27 MED ORDER — CALCIUM CARB-CHOLECALCIFEROL 500-400 MG-UNIT PO TABS: 1.0000 | ORAL_TABLET | Freq: Every day | ORAL | Status: DC

## 2013-01-27 MED ORDER — CARVEDILOL 25 MG PO TABS
25.0000 mg | ORAL_TABLET | Freq: Two times a day (BID) | ORAL | Status: DC
  Administered 2013-01-27: 25 mg via ORAL
  Filled 2013-01-27 (×3): qty 1

## 2013-01-27 MED ORDER — SODIUM CHLORIDE 0.9 % IJ SOLN
3.0000 mL | Freq: Two times a day (BID) | INTRAMUSCULAR | Status: DC
  Administered 2013-01-27 – 2013-01-28 (×2): 3 mL via INTRAVENOUS

## 2013-01-27 MED ORDER — ALLOPURINOL 100 MG PO TABS
200.0000 mg | ORAL_TABLET | Freq: Every day | ORAL | Status: DC
  Administered 2013-01-28 – 2013-01-30 (×3): 200 mg via ORAL
  Filled 2013-01-27 (×3): qty 2

## 2013-01-27 MED ORDER — ESTROGENS CONJUGATED 25 MG IJ SOLR
25.0000 mg | Freq: Once | INTRAMUSCULAR | Status: AC
  Administered 2013-01-27: 25 mg via INTRAVENOUS
  Filled 2013-01-27: qty 25

## 2013-01-27 MED ORDER — ACETAMINOPHEN 325 MG PO TABS
650.0000 mg | ORAL_TABLET | Freq: Four times a day (QID) | ORAL | Status: DC | PRN
  Administered 2013-01-27: 650 mg via ORAL
  Filled 2013-01-27: qty 2

## 2013-01-27 MED ORDER — SODIUM CHLORIDE 0.9 % IJ SOLN
3.0000 mL | Freq: Two times a day (BID) | INTRAMUSCULAR | Status: DC
  Administered 2013-01-27 – 2013-01-28 (×3): 3 mL via INTRAVENOUS

## 2013-01-27 MED ORDER — ONDANSETRON HCL 4 MG/2ML IJ SOLN: 4.0000 mg | Freq: Four times a day (QID) | INTRAMUSCULAR | Status: DC | PRN

## 2013-01-27 MED ORDER — SIMVASTATIN 20 MG PO TABS
20.0000 mg | ORAL_TABLET | Freq: Every day | ORAL | Status: DC
  Administered 2013-01-27 – 2013-01-29 (×3): 20 mg via ORAL
  Filled 2013-01-27 (×4): qty 1

## 2013-01-27 MED ORDER — CARVEDILOL 3.125 MG PO TABS
3.1250 mg | ORAL_TABLET | Freq: Two times a day (BID) | ORAL | Status: DC
  Filled 2013-01-27 (×2): qty 1

## 2013-01-27 MED ORDER — CALCIUM CARBONATE-VITAMIN D 500-200 MG-UNIT PO TABS
3.0000 | ORAL_TABLET | Freq: Every day | ORAL | Status: DC
  Administered 2013-01-27 – 2013-01-30 (×4): 3 via ORAL
  Filled 2013-01-27 (×6): qty 3

## 2013-01-27 MED ORDER — INFLUENZA VAC SPLIT QUAD 0.5 ML IM SUSP
0.5000 mL | INTRAMUSCULAR | Status: AC
  Administered 2013-01-29: 0.5 mL via INTRAMUSCULAR
  Filled 2013-01-27 (×2): qty 0.5

## 2013-01-27 MED ORDER — ACETAMINOPHEN 650 MG RE SUPP: 650.0000 mg | Freq: Four times a day (QID) | RECTAL | Status: DC | PRN

## 2013-01-27 MED ORDER — HYDRALAZINE HCL 50 MG PO TABS
100.0000 mg | ORAL_TABLET | Freq: Two times a day (BID) | ORAL | Status: DC
  Filled 2013-01-27 (×2): qty 2

## 2013-01-27 MED ORDER — ONDANSETRON HCL 4 MG PO TABS: 4.0000 mg | ORAL_TABLET | Freq: Four times a day (QID) | ORAL | Status: DC | PRN

## 2013-01-27 MED ORDER — MEGESTROL ACETATE 20 MG PO TABS
20.0000 mg | ORAL_TABLET | Freq: Two times a day (BID) | ORAL | Status: DC
  Administered 2013-01-27 – 2013-01-30 (×6): 20 mg via ORAL
  Filled 2013-01-27 (×8): qty 1

## 2013-01-27 NOTE — ED Notes (Addendum)
Per EMS , pt. From home who reported of LOC as she got out  from the bathroom, at around 1145pm last night  .pt. don't know how long she lost consciousness, denied hitting her head, denies any pain, no SOB. Alert and orientedx4.  Pt. Also claimed of having dizziness for few days now, currently on her Period . Pt. Has multiple medical problems.

## 2013-01-27 NOTE — ED Notes (Signed)
MD at bedside. 

## 2013-01-27 NOTE — Progress Notes (Addendum)
Chart reviewed.   [facility] PROGRESS NOTE  [name] [id number] DOB: 18-Jul-1975 DOA: 01/27/2013 PCP: Pcp Not In System  Assessment/Plan:  Principal Problem:   Anemia, likely mainly menorrhagia related, but could be kidney disease contributing. Has only received one unit of blood. Awaiting second unit.  Active Problems:   HYPERTENSION, UNSPECIFIED: Hold hydralazine due to relative hypotension. Decrease Coreg dose.    CARDIOMYOPATHY, DILATED: Continue Lasix.    CKD (chronic kidney disease) stage 5, GFR less than 15 ml/min, not yet on dialysis. Have asked nephrology to follow along in case problems with fluid overload arise with transfusions. No urgent need for dialysis at this time.    Syncope secondary to anemia and probable hypotension, none further.    HYPERLIPIDEMIA-MIXED on statin    Menorrhagia: Patient usually has heavy periods, but this is much heavier than usual. In fact, she has been passing clots for about 5 days and requires adult diapers, rather than pads due to the amount of bleeding. Patient has not had a pelvic exam or GYN followup for "many years".  Discussed with Dr. Roselie Awkward, gynecology. He recommends Megace 20 mg by mouth twice a day, Premarin 25 mg IV x1 and followup in the women's clinic upon discharge. Patient may call [phone] to schedule appointment.  Problem list includes "thyroid disease". She is not on thyroid medications. Will check TSH.  morbid obesity  Code Status:  full Family Communication:   Disposition Plan:  home  Consultants:  nephrology  Procedures:     Antibiotics:    HPI/Subjective: Still passing clots. Has been wearing adult diapers. No dyspnea. No chest pain or palpitations. Periods usually last 3-4 days with 2 days of clots. Has been having large clots since Monday. Reports that her AV fistula is to be revised.  Objective: Filed Vitals:   01/27/13 0900  BP: 104/35  Pulse:   Temp:   Resp: 16     Intake/Output Summary (Last 24 hours) at 01/27/13 0945 Last data filed at 01/27/13 0900  Gross per 24 hour  Intake 445.17 ml  Output     45 ml  Net 400.17 ml   There were no vitals filed for this visit.  Exam:   General:  Comfortable. Watching TV.  Neck is thick and supple, difficult to assess JVD. No definite thyromegaly  Cardiovascular: Regular rate rhythm without murmurs gallops rubs  Respiratory: Regular rate rhythm without murmurs gallops rubs  Abdomen: Obese soft nontender  Ext: Sequential compression devices. No edema  Basic Metabolic Panel:  Recent Labs Lab 01/27/13 0059 01/27/13 0148 01/27/13 0544  NA 139 140 140  K 4.3 4.0 4.7  CL 102 103 103  CO2 26  --  24  GLUCOSE 125* 117* 123*  BUN 42* 46* 42*  CREATININE 6.44* 6.50* 6.08*  CALCIUM 8.6  --  9.0   Liver Function Tests:  Recent Labs Lab 01/27/13 0059  AST 7  ALT 5  ALKPHOS 36*  BILITOT <0.2*  PROT 6.5  ALBUMIN 2.9*   No results found for this basename: LIPASE, AMYLASE,  in the last 168 hours No results found for this basename: AMMONIA,  in the last 168 hours CBC:  Recent Labs Lab 01/27/13 0059 01/27/13 0148 01/27/13 0544  WBC 15.7*  --  14.8*  NEUTROABS 11.4*  --   --   HGB 3.7* 4.4* 4.3*  HCT 12.5* 13.0* 13.8*  MCV 81.2  --  80.7  PLT 279  --  260   Cardiac Enzymes:  Recent Labs Lab 01/27/13 0059  TROPONINI <0.30   BNP (last 3 results) No results found for this basename: PROBNP,  in the last 8760 hours CBG:  Recent Labs Lab 01/27/13 0027  GLUCAP 133*    Recent Results (from the past 240 hour(s))  MRSA PCR SCREENING     Status: None   Collection Time    01/27/13  5:27 AM      Result Value Range Status   MRSA by PCR NEGATIVE  NEGATIVE Final   Comment:            The GeneXpert MRSA Assay (FDA     approved for NASAL specimens     only), is one component of a     comprehensive MRSA colonization     surveillance program. It is not     intended to diagnose  MRSA     infection nor to guide or     monitor treatment for     MRSA infections.     Studies: Dg Chest Port 1 View  01/27/2013   CLINICAL DATA:  Syncope.  Dizziness.  Renal failure.  EXAM: PORTABLE CHEST - 1 VIEW  COMPARISON:  Chest radiograph performed 08/03/2011  FINDINGS: The lungs are mildly hypoexpanded. Vascular crowding and vascular congestion are seen, with mildly increased interstitial markings, possibly reflecting mild interstitial edema. There is no evidence of pleural effusion or pneumothorax.  The cardiomediastinal silhouette is mildly enlarged. No acute osseous abnormalities are seen.  IMPRESSION: Lungs mildly hypoexpanded. Vascular congestion and mild cardiomegaly, with mildly increased interstitial markings, possibly reflecting mild interstitial edema.   Electronically Signed   By: Garald Balding M.D.   On: 01/27/2013 02:57    Scheduled Meds: . allopurinol  100 mg Oral Daily  . [START ON 01/29/2013] calcitRIOL  0.25 mcg Oral 3 times weekly  . calcium-vitamin D  3 tablet Oral Q breakfast  . carvedilol  3.125 mg Oral BID WC  . furosemide  80 mg Oral BID  . [START ON 01/28/2013] influenza vac split quadrivalent PF  0.5 mL Intramuscular Tomorrow-1000  . simvastatin  20 mg Oral q1800  . sodium chloride  3 mL Intravenous Q12H  . sodium chloride  3 mL Intravenous Q12H   Continuous Infusions:   Time spent: 35 minutes  [facility] Pager [phone]. If 7PM-7AM, please contact night-coverage at [email or web address], password [facility] 01/27/2013, 9:45 AM  LOS: 0 days

## 2013-01-27 NOTE — ED Provider Notes (Signed)
CSN: [id number]     Arrival date & time 01/27/13  0015 History   First MD Initiated Contact with Patient 01/27/13 0046     Chief Complaint  Patient presents with  . Fall  . Dizziness  . Loss of Consciousness   (Consider location/radiation/quality/duration/timing/severity/associated sxs/prior Treatment) HPI -year-old female presents to emergency department via EMS with complaint of dizziness and syncope.  Patient reports that she has been dizzy for the last few days, worse today.  Patient reports dizziness, mainly when walking back from the bathroom.  She reports she got up she used the bathroom just prior to arrival, and woke up on the floor.  She denies any injury, with the fall from her syncope.  She has had poor appetite, ate 2 pieces of pizza for breakfast this morning, and a package of peanut butter crackers this evening.  She reports that she is drinking well.  Patient has past history of hypertension, dilated cardiomyopathy, chronic kidney disease.  She reports she is currently on her menses.  She reports she has a heavy menstrual cycle, with clots and has had for some time.  Patient has had AV fistula placed for potential dialysis.  She reports that she sees Dr. Lorrene Reid every 2 months.  She does not recall her last creatinine.  She missed her appointment this past week with Dr. Lorrene Reid due to her heavy menstrual cycle.  She denies missing any doses of her medications, but has not taken her evening dose of Lasix.  No fever, chills, nausea, vomiting, shortness of breath, cough, abdominal pain.  No headache.  No focal neuro symptoms.  No prior history of syncope. Past Medical History  Diagnosis Date  . Hypertension   . Thyroid disease   . Morbid obesity   . Hyperlipidemia   . History of pneumonia   . Foot fracture   . Dilated cardiomyopathy   . Chronic kidney disease   . Anxiety   . Pneumonia     hosp. 2010  . CHF (congestive heart failure)     hosp. 2011  . Gout    Past Surgical  History  Procedure Laterality Date  . Cardiac surgery  1979    repair of hole in heart  . Av fistula placement  08/04/2011    Procedure: ARTERIOVENOUS (AV) FISTULA CREATION;  Surgeon: Elam Dutch, MD;  [facility];  Service: Vascular;  Laterality: Right;  Creation right brachiocephalic arteriovenous fistula   Family History  Problem Relation Age of Onset  . Diabetes Mother   . Hyperlipidemia Mother   . Hypertension Mother   . Kidney disease Mother   . Anesthesia problems Neg Hx   . Hypotension Neg Hx   . Malignant hyperthermia Neg Hx   . Pseudochol deficiency Neg Hx    History  Substance Use Topics  . Smoking status: Never Smoker   . Smokeless tobacco: Never Used  . Alcohol Use: Yes     Comment: rare use   OB History   Grav Para Term Preterm Abortions TAB SAB Ect Mult Living                 Review of Systems  See History of Present Illness; otherwise all other systems are reviewed and negative Allergies  Review of patient's allergies indicates no known allergies.  Home Medications   Current Outpatient Rx  Name  Route  Sig  Dispense  Refill  . allopurinol (ZYLOPRIM) 100 MG tablet   Oral   Take 100  mg by mouth daily.          Marland Kitchen aspirin EC 81 MG tablet   Oral   Take 81 mg by mouth daily.          . calcitRIOL (ROCALTROL) 0.25 MCG capsule   Oral   Take 0.25 mcg by mouth 3 (three) times a week. Monday Wednesday and Friday         . Calcium Carb-Cholecalciferol (CALCIUM 500 +D PO)   Oral   Take 3 tablets by mouth daily.         . carvedilol (COREG) 25 MG tablet   Oral   Take 25 mg by mouth 2 (two) times daily with a meal.         . furosemide (LASIX) 80 MG tablet   Oral   Take 80 mg by mouth 2 (two) times daily.         . hydrALAZINE (APRESOLINE) 25 MG tablet   Oral   Take 100 mg by mouth 2 (two) times daily.         . pravastatin (PRAVACHOL) 40 MG tablet   Oral   Take 40 mg by mouth daily.           BP 106/41  Pulse 91   Temp(Src) 98.5 F (36.9 C) (Oral)  Resp 22  SpO2 96%  LMP 01/27/2013 Physical Exam  Constitutional: She is oriented to person, place, and time. She appears well-developed and well-nourished.  Morbidly obese, female, in no acute distress  HENT:  Head: Normocephalic and atraumatic.  Nose: Nose normal.  Mouth/Throat: Oropharynx is clear and moist.  Eyes: Conjunctivae and EOM are normal. Pupils are equal, round, and reactive to light.  Pale conjunctiva  Neck: Normal range of motion. Neck supple. No JVD present. No tracheal deviation present. No thyromegaly present.  Cardiovascular: Normal rate, regular rhythm, normal heart sounds and intact distal pulses.  Exam reveals no gallop and no friction rub.   No murmur heard. Pulmonary/Chest: Effort normal and breath sounds normal. No stridor. No respiratory distress. She has no wheezes. She has no rales. She exhibits no tenderness.  Abdominal: Soft. Bowel sounds are normal. She exhibits no distension and no mass. There is no tenderness. There is no rebound and no guarding.  Musculoskeletal: Normal range of motion. She exhibits no edema and no tenderness.  Pale nailbeds  Lymphadenopathy:    She has no cervical adenopathy.  Neurological: She is alert and oriented to person, place, and time. She has normal reflexes. No cranial nerve deficit. She exhibits normal muscle tone. Coordination normal.  Skin: Skin is warm and dry. No rash noted. No erythema. No pallor.  Psychiatric: She has a normal mood and affect. Her behavior is normal. Judgment and thought content normal.    ED Course  Procedures (including critical care time)  CRITICAL CARE Performed by: Kalman Drape Total critical care time: 90 min Critical care time was exclusive of separately billable procedures and treating other patients. Critical care was necessary to treat or prevent imminent or life-threatening deterioration. Critical care was time spent personally by me on the following  activities: development of treatment plan with patient and/or surrogate as well as nursing, discussions with consultants, evaluation of patient's response to treatment, examination of patient, obtaining history from patient or surrogate, ordering and performing treatments and interventions, ordering and review of laboratory studies, ordering and review of radiographic studies, pulse oximetry and re-evaluation of patient's condition.  Labs Review Labs Reviewed  GLUCOSE, CAPILLARY -  Abnormal; Notable for the following:    Glucose-Capillary 133 (*)    All other components within normal limits  CBC WITH DIFFERENTIAL - Abnormal; Notable for the following:    WBC 15.7 (*)    RBC 1.54 (*)    Hemoglobin 3.7 (*)    HCT 12.5 (*)    MCH 24.0 (*)    MCHC 29.6 (*)    RDW 16.8 (*)    Neutro Abs 11.4 (*)    Eosinophils Absolute 0.8 (*)    All other components within normal limits  COMPREHENSIVE METABOLIC PANEL - Abnormal; Notable for the following:    Glucose, Bld 125 (*)    BUN 42 (*)    Creatinine, Ser 6.44 (*)    Albumin 2.9 (*)    Alkaline Phosphatase 36 (*)    Total Bilirubin <0.2 (*)    GFR calc non Af Amer 7 (*)    GFR calc Af Amer 9 (*)    All other components within normal limits  POCT I-STAT, CHEM 8 - Abnormal; Notable for the following:    BUN 46 (*)    Creatinine, Ser 6.50 (*)    Glucose, Bld 117 (*)    Hemoglobin 4.4 (*)    HCT 13.0 (*)    All other components within normal limits  TROPONIN I  TYPE AND SCREEN  PREPARE RBC (CROSSMATCH)   Imaging Review No results found.  EKG Interpretation    Date/Time:  Saturday January 27 2013 [id number] EST Ventricular Rate:  80 PR Interval:  141 QRS Duration: 102 QT Interval:  460 QTC Calculation: 531 R Axis:   53 Text Interpretation:  Sinus rhythm Probable left ventricular hypertrophy Prolonged QT interval t wave inversions resolved from prior Confirmed by Endiya Klahr  MD, Datrell Dunton [id number]) on 01/27/2013 12:50:16 AM            MDM    1. Syncope   2. Severe anemia   3. Renal failure (ARF), acute on chronic    38 year old female with dizziness and syncope.  During orthostatics, patient had another syncopal episode.  She recovered easily.  Once laying flat.  Patient found to have a creatinine of 6.5 with last creatinine documented 3.6.  She also is severely anemic with a hemoglobin of 3.7.  Patient was counseled that she would need a blood transfusion and agrees.  No hyperkalemia or severe dyspnea, indicating emergent dialysis required.  She may require dialysis, however, given elevated creatinine, from her baseline.  Will discuss with hospitalist for admission to [facility].    Kalman Drape, MD 01/27/13 [phone]

## 2013-01-27 NOTE — ED Notes (Signed)
Called main lab, spoke with [place]. Informed her that labs pending from 0252hrs cannot be drawn due to pt receiving blood transfusion.

## 2013-01-27 NOTE — ED Notes (Signed)
Bed: [id number] Expected date:  Expected time:  Means of arrival:  Comments: Bed 23, EMS, 37 F, Dizziness

## 2013-01-27 NOTE — Consult Note (Signed)
[facility] CONSULT NOTE    Date: 01/27/2013                  Patient Name:  [name]  MRN: [id number]  DOB: 09-10-1975  Age / Sex: 38 y.o., female         PCP: Pcp Not In System                 Service Requesting Consult: Internal Medicine                 Reason for Consult: CKD V/ESRD            History of Present Illness: [name] is a 38 y.o. female history of chronic kidney disease/ESRD, hypertension, cardiomyopathy, hyperlipidemia admitted for lost of consciousness found to have Hgb of 3.7.  Pt followed by Dr. Lorrene Reid in the outpatient setting and was last seen about 2 months ago, creatinine at that time was 6.4.  Pt had been set up with vascular surgery to have AVF placement in July, had an AVF placed in her R arm, however, did not follow up two subsequent times to have revision placed.  Pt denies any current nausea, vomiting, anorexia, lower extremity edema, back pain, fever, chills, palpitations, CP, or sweats.  For her current episode, patient usually has heavy periods, but much heavier during current menses. She states she has been passing clots for about 5 days and requires adult diapers, rather than pads due to the amount of bleeding.   Medications: Outpatient medications: Prescriptions prior to admission  Medication Sig Dispense Refill  . allopurinol (ZYLOPRIM) 100 MG tablet Take 100 mg by mouth daily.       Marland Kitchen aspirin EC 81 MG tablet Take 81 mg by mouth daily.       . calcitRIOL (ROCALTROL) 0.25 MCG capsule Take 0.25 mcg by mouth 3 (three) times a week. Monday Wednesday and Friday      . Calcium Carb-Cholecalciferol (CALCIUM 500 +D PO) Take 3 tablets by mouth daily.      . carvedilol (COREG) 25 MG tablet Take 25 mg by mouth 2 (two) times daily with a meal.      . furosemide (LASIX) 80 MG tablet Take 80 mg by mouth 2 (two) times daily.      . hydrALAZINE (APRESOLINE) 25 MG tablet Take 100 mg by mouth 2 (two) times daily.      . pravastatin  (PRAVACHOL) 40 MG tablet Take 40 mg by mouth daily.         Current medications: Current Facility-Administered Medications  Medication Dose Route Frequency Provider Last Rate Last Dose  . acetaminophen (TYLENOL) tablet 650 mg  650 mg Oral Q6H PRN Rise Patience, MD       Or  . acetaminophen (TYLENOL) suppository 650 mg  650 mg Rectal Q6H PRN Rise Patience, MD      . allopurinol (ZYLOPRIM) tablet 100 mg  100 mg Oral Daily Rise Patience, MD   100 mg at 01/27/13 0912  . [START ON 01/29/2013] calcitRIOL (ROCALTROL) capsule 0.25 mcg  0.25 mcg Oral 3 times weekly Rise Patience, MD      . calcium-vitamin D (OSCAL WITH D) 500-200 MG-UNIT per tablet 3 tablet  3 tablet Oral Q breakfast Rise Patience, MD   3 tablet at 01/27/13 0911  . carvedilol (COREG) tablet 3.125 mg  3.125 mg Oral BID WC Delfina Redwood, MD      .  conjugated estrogens (PREMARIN) injection 25 mg  25 mg Intravenous Once Delfina Redwood, MD      . furosemide (LASIX) tablet 80 mg  80 mg Oral BID Rise Patience, MD   80 mg at 01/27/13 0911  . [START ON 01/28/2013] influenza vac split quadrivalent PF (FLUARIX) injection 0.5 mL  0.5 mL Intramuscular Tomorrow-1000 Delfina Redwood, MD      . megestrol (MEGACE) tablet 20 mg  20 mg Oral BID Delfina Redwood, MD      . ondansetron [facility]) tablet 4 mg  4 mg Oral Q6H PRN Rise Patience, MD       Or  . ondansetron [facility]) injection 4 mg  4 mg Intravenous Q6H PRN Rise Patience, MD      . simvastatin (ZOCOR) tablet 20 mg  20 mg Oral q1800 Rise Patience, MD      . sodium chloride 0.9 % injection 3 mL  3 mL Intravenous Q12H Rise Patience, MD   3 mL at 01/27/13 0913  . sodium chloride 0.9 % injection 3 mL  3 mL Intravenous Q12H Rise Patience, MD   3 mL at 01/27/13 [id number]      Allergies: No Known Allergies    Past Medical History: Past Medical History  Diagnosis Date  . Hypertension   . Thyroid disease   . Morbid obesity    . Hyperlipidemia   . History of pneumonia   . Foot fracture   . Dilated cardiomyopathy   . Chronic kidney disease   . Anxiety   . Pneumonia     hosp. 2010  . CHF (congestive heart failure)     hosp. 2011  . Gout      Past Surgical History: Past Surgical History  Procedure Laterality Date  . Cardiac surgery  1979    repair of hole in heart  . Av fistula placement  08/04/2011    Procedure: ARTERIOVENOUS (AV) FISTULA CREATION;  Surgeon: Elam Dutch, MD;  Location[facility] OR;  Service: Vascular;  Laterality: Right;  Creation right brachiocephalic arteriovenous fistula     Family History: Family History  Problem Relation Age of Onset  . Diabetes Mother   . Hyperlipidemia Mother   . Hypertension Mother   . Kidney disease Mother   . Anesthesia problems Neg Hx   . Hypotension Neg Hx   . Malignant hyperthermia Neg Hx   . Pseudochol deficiency Neg Hx      Social History: History   Social History  . Marital Status: Single    Spouse Name: N/A    Number of Children: N/A  . Years of Education: N/A   Occupational History  . Not on file.   Social History Main Topics  . Smoking status: Never Smoker   . Smokeless tobacco: Never Used  . Alcohol Use: Yes     Comment: rare use  . Drug Use: No  . Sexual Activity: Yes    Birth Control/ Protection: None     Comment: no birth control    Other Topics Concern  . Not on file   Social History Narrative  . No narrative on file     Review of Systems: As per HPI  Vital Signs: Blood pressure 104/35, pulse 83, temperature 98.9 F (37.2 C), temperature source Oral, resp. rate 16, last menstrual period 01/27/2013, SpO2 100.00%. Physical Exam: Gen: NAD, comfortable in bed HEENT: MMM, [place]/AT, EOMI B/L Fundi benign Neck: No JVD Cardio: RRR, flow murmur  2/6 Lung: CTA B/L, no wheezes/rales Abdomen: Obese, Soft/NT/ND Extremities: No B/L LE edema   Lab results: Basic Metabolic Panel:  Recent Labs Lab 01/27/13 0059  01/27/13 0148 01/27/13 0544  NA 139 140 140  K 4.3 4.0 4.7  CL 102 103 103  CO2 26  --  24  GLUCOSE 125* 117* 123*  BUN 42* 46* 42*  CREATININE 6.44* 6.50* 6.08*  CALCIUM 8.6  --  9.0    Liver Function Tests:  Recent Labs Lab 01/27/13 0059  AST 7  ALT 5  ALKPHOS 36*  BILITOT <0.2*  PROT 6.5  ALBUMIN 2.9*   CBC:  Recent Labs Lab 01/27/13 0059 01/27/13 0148 01/27/13 0544  WBC 15.7*  --  14.8*  NEUTROABS 11.4*  --   --   HGB 3.7* 4.4* 4.3*  HCT 12.5* 13.0* 13.8*  MCV 81.2  --  80.7  PLT 279  --  260    Cardiac Enzymes:  Recent Labs Lab 01/27/13 0059  TROPONINI <0.30   CBG:  Recent Labs Lab 01/27/13 0027  GLUCAP 133*    Microbiology: Results for orders placed during the hospital encounter of 01/27/13  MRSA PCR SCREENING     Status: None   Collection Time    01/27/13  5:27 AM      Result Value Range Status   MRSA by PCR NEGATIVE  NEGATIVE Final   Comment:            The GeneXpert MRSA Assay (FDA     approved for NASAL specimens     only), is one component of a     comprehensive MRSA colonization     surveillance program. It is not     intended to diagnose MRSA     infection nor to guide or     monitor treatment for     MRSA infections.   Urinalysis: No results found for this basename: COLORURINE, APPERANCEUR, LABSPEC, PHURINE, GLUCOSEU, HGBUR, BILIRUBINUR, KETONESUR, PROTEINUR, UROBILINOGEN, NITRITE, LEUKOCYTESUR,  in the last 72 hours    Imaging: Dg Chest Port 1 View  01/27/2013  IMPRESSION: Lungs mildly hypoexpanded. Vascular congestion and mild cardiomegaly, with mildly increased interstitial markings, possibly reflecting mild interstitial edema.   Electronically Signed   By: Garald Balding M.D.   On: 01/27/2013 02:57   Assessment & Plan: [name] is a 38 y.o. female history of chronic kidney disease/ESRD, hypertension, cardiomyopathy, hyperlipidemia admitted for lost of consciousness found to have Hgb of 3.7.    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  Vol ok, need to follow K with tx, and vol.  Needs to get second stage BVT while here as has missed several appt to do so 2) Severe Normocytic Anemia - May be combination of renal disease with acute process (excess menorrhagia) as RDW is increased.  Vitals stable Add epo in near future 3) Menorrhagia - Per primary, Megace/Premarin and will need gyn evaluation  4) Metabolic Bone Disease - Continue on Calcitriol Do not feel we need to do extensive eval 5) Hx of gout - on allopurinol 6) HTN - Hold home medications in setting of ABL and hypotension  7) Hyperlipidemia - Continue Pravachol  Plan 1) Obtain UA, monitor I/O and BP  2) F/U on Ferritin, Fe, TIBC studies 3) Consult to vascular for possible tunnel cath and revision of her R arm AVF when pt has become stable.  4) F/U BMETs s/p transfusion to evaluate for hyperkalemia and possible indications for  HD.  At this time, no acute indications for emergent HD 5) Trend creatinine and renal panel 6) Only would give transfusions w/o fluids to prevent overload state  Bryan R. Hess, DO of [facility] 01/27/2013, 10:43 AM I have seen and examined this patient and agree with the plan of care seen, examined, eval, discussed, patient counseled.  Records from office obtained.  Will get [facility]VS to see also. Marland Kitchen  Duane Earnshaw L 01/27/2013, 11:46 AM

## 2013-01-27 NOTE — ED Notes (Signed)
Pt. i-stat chem 8 results pt. Hemoglobin 4.4. EDP, [place],MD. Made aware.

## 2013-01-27 NOTE — H&P (Signed)
[facility] History and Physical  [name] [id number] DOB: [birth year] DOA: 01/27/2013  Referring physician: ER physician. PCP: Pcp Not In System  Specialists: Dr. Jamal Maes. Nephrologist.  Chief Complaint: Loss of consciousness.  HPI: [name] is a 38 y.o. female history of chronic kidney disease/ESRD, hypertension, cardiomyopathy, hyperlipidemia last evening while walking to the bathroom felt dizzy and lost consciousness. She does not recall how long she lost consciousness but regained consciousness spontaneously. Did not hit her head or have any focal deficits seizure-like activities or incontinence of urine or tongue bite. Denies any chest pain or shortness of breath. Since yesterday morning patient was feeling very weak. In the ER patient's hemoglobin was found to be around 3.7. Patient states that over the last few months patient has been having severe menorrhagia. Denies having used any NSAIDs or having any rectal bleeding or did not throw up any blood. Patient has known history of chronic disease and creatinine at this time is around 6 patient does not have any hyperkalemia and does not look to be in obvious fluid overload. Patient will be admitted for transfusion and further management. Since patient has chronic kidney disease/ESRD patient will be transferred to [facility], patient is agreeable to the plan.   Review of Systems: As presented in the history of presenting illness, rest negative.  Past Medical History  Diagnosis Date  . Hypertension   . Thyroid disease   . Morbid obesity   . Hyperlipidemia   . History of pneumonia   . Foot fracture   . Dilated cardiomyopathy   . Chronic kidney disease   . Anxiety   . Pneumonia     hosp. 2010  . CHF (congestive heart failure)     hosp. 2011  . Gout    Past Surgical History  Procedure Laterality Date  . Cardiac surgery  1979    repair of hole in heart  . Av fistula placement  08/04/2011    Procedure: ARTERIOVENOUS (AV) FISTULA CREATION;  Surgeon: Elam Dutch, MD;  Location: [facility] OR;  Service: Vascular;  Laterality: Right;  Creation right brachiocephalic arteriovenous fistula   Social History:  reports that she has never smoked. She has never used smokeless tobacco. She reports that she drinks alcohol. She reports that she does not use illicit drugs. Where does patient live home. Can patient participate in ADLs? Yes.  No Known Allergies  Family History:  Family History  Problem Relation Age of Onset  . Diabetes Mother   . Hyperlipidemia Mother   . Hypertension Mother   . Kidney disease Mother   . Anesthesia problems Neg Hx   . Hypotension Neg Hx   . Malignant hyperthermia Neg Hx   . Pseudochol deficiency Neg Hx       Prior to Admission medications   Medication Sig Start Date End Date Taking? Authorizing Provider  allopurinol (ZYLOPRIM) 100 MG tablet Take 100 mg by mouth daily.    Yes Historical Provider, MD  aspirin EC 81 MG tablet Take 81 mg by mouth daily.    Yes Historical Provider, MD  calcitRIOL (ROCALTROL) 0.25 MCG capsule Take 0.25 mcg by mouth 3 (three) times a week. Monday Wednesday and Friday   Yes Historical Provider, MD  Calcium Carb-Cholecalciferol (CALCIUM 500 +D PO) Take 3 tablets by mouth daily.   Yes Historical Provider, MD  carvedilol (COREG) 25 MG tablet Take 25 mg by mouth 2 (two) times daily with a meal.   Yes Historical Provider,  MD  furosemide (LASIX) 80 MG tablet Take 80 mg by mouth 2 (two) times daily.   Yes Historical Provider, MD  hydrALAZINE (APRESOLINE) 25 MG tablet Take 100 mg by mouth 2 (two) times daily.   Yes Historical Provider, MD  pravastatin (PRAVACHOL) 40 MG tablet Take 40 mg by mouth daily.    Yes Historical Provider, MD    Physical Exam: Filed Vitals:   01/27/13 0132 01/27/13 0250 01/27/13 0259 01/27/13 0300  BP: 106/41     Pulse: 91  72   Temp:  98.6 F (37 C) 98.6 F (37 C) 98.6 F (37 C)  TempSrc:  Oral    Resp:       SpO2:         General:  Well-developed and nourished.  Eyes: pallor present. No icterus.  ENT: No discharge from the ears eyes nose mouth.  Neck: No mass felt.  Cardiovascular: S1-S2 heard.  Respiratory: No rhonchi or crepitations.  Abdomen: Soft nontender bowel sounds present.  Skin: No rash.  Musculoskeletal: No edema.  Psychiatric: Appears normal.  Neurologic: Alert awake oriented to time place and person. Moves all extremities.  Labs on Admission:  Basic Metabolic Panel:  Recent Labs Lab 01/27/13 0059 01/27/13 0148  NA 139 140  K 4.3 4.0  CL 102 103  CO2 26  --   GLUCOSE 125* 117*  BUN 42* 46*  CREATININE 6.44* 6.50*  CALCIUM 8.6  --    Liver Function Tests:  Recent Labs Lab 01/27/13 0059  AST 7  ALT 5  ALKPHOS 36*  BILITOT <0.2*  PROT 6.5  ALBUMIN 2.9*   No results found for this basename: LIPASE, AMYLASE,  in the last 168 hours No results found for this basename: AMMONIA,  in the last 168 hours CBC:  Recent Labs Lab 01/27/13 0059 01/27/13 0148  WBC 15.7*  --   NEUTROABS 11.4*  --   HGB 3.7* 4.4*  HCT 12.5* 13.0*  MCV 81.2  --   PLT 279  --    Cardiac Enzymes:  Recent Labs Lab 01/27/13 0059  TROPONINI <0.30    BNP (last 3 results) No results found for this basename: PROBNP,  in the last 8760 hours CBG:  Recent Labs Lab 01/27/13 0027  GLUCAP 133*    Radiological Exams on Admission: Dg Chest Port 1 View  01/27/2013   CLINICAL DATA:  Syncope.  Dizziness.  Renal failure.  EXAM: PORTABLE CHEST - 1 VIEW  COMPARISON:  Chest radiograph performed 08/03/2011  FINDINGS: The lungs are mildly hypoexpanded. Vascular crowding and vascular congestion are seen, with mildly increased interstitial markings, possibly reflecting mild interstitial edema. There is no evidence of pleural effusion or pneumothorax.  The cardiomediastinal silhouette is mildly enlarged. No acute osseous abnormalities are seen.  IMPRESSION: Lungs mildly  hypoexpanded. Vascular congestion and mild cardiomegaly, with mildly increased interstitial markings, possibly reflecting mild interstitial edema.   Electronically Signed   By: Garald Balding M.D.   On: 01/27/2013 02:57    EKG: Independently reviewed. Normal sinus rhythm with LVH features. Patient also has QTC 531.  Assessment/Plan Principal Problem:   Anemia Active Problems:   HYPERLIPIDEMIA-MIXED   HYPERTENSION, UNSPECIFIED   CARDIOMYOPATHY, DILATED   CKD (chronic kidney disease) stage 5, GFR less than 15 ml/min   Syncope   1. Severe anemia - probably related to a combination of severe menorrhagia and ESRD. At this time patient is receiving 2 units of packed red blood cells. Closely follow CBC after transfusion.  Check anemia panel. Patient will be monitored in step down given the severe anemia and renal status.  2. Syncope - probably related to severe anemia but given the prolonged QTC closely monitor for any arrhythmias. 3. Menorrhagia - patient will need eventual OB/GYN followup. 4. CKD/ESRD - closely monitor respiratory status since patient is receiving packed red blood cells. Patient at this time does not clinically look fluid overloaded. Chest x-ray shows congestion but patient is not short of breath. Patient does not have any hyperkalemia. Closely follow metabolic panel intake output. Continue Lasix. Patient's last labs available in our system was in 2012 showing creatinine of 3.7. Patient is following with nephrologist Dr. Lorrene Reid. Last time she had seen nephrologist was 2 months ago. Patient has an AV fistula on her right arm. 5. History of cardiomyopathy last EF measured was 30% in 2010 - closely follow intake output and metabolic panel. Patient is on Lasix. 6. Hypertension - continue home medications. 7. History of gout - continue allopurinol.  I have reviewed patient's old chart and labs. Patient is being transferred to [facility]. Accepting physician is Dr. Henreitta Leber.  Code Status: Full code.  Family Communication: None.  Disposition Plan: Admit to inpatient.    Annett Boxwell N. [facility] Pager [phone].  If 7PM-7AM, please contact night-coverage [email or web address] Password [email or web address] 01/27/2013, 3:30 AM

## 2013-01-28 DIAGNOSIS — Z01810 Encounter for preprocedural cardiovascular examination: Secondary | ICD-10-CM

## 2013-01-28 DIAGNOSIS — N920 Excessive and frequent menstruation with regular cycle: Secondary | ICD-10-CM

## 2013-01-28 DIAGNOSIS — D72829 Elevated white blood cell count, unspecified: Secondary | ICD-10-CM | POA: Diagnosis present

## 2013-01-28 DIAGNOSIS — N185 Chronic kidney disease, stage 5: Secondary | ICD-10-CM

## 2013-01-28 DIAGNOSIS — I10 Essential (primary) hypertension: Secondary | ICD-10-CM

## 2013-01-28 LAB — CBC
HCT: 17.9 % — ABNORMAL LOW (ref 36.0–46.0)
HCT: 20.9 % — ABNORMAL LOW (ref 36.0–46.0)
HEMOGLOBIN: 6.1 g/dL — AB (ref 12.0–15.0)
HEMOGLOBIN: 7 g/dL — AB (ref 12.0–15.0)
MCH: 27.7 pg (ref 26.0–34.0)
MCH: 27.1 pg (ref 26.0–34.0)
MCHC: 34.1 g/dL (ref 30.0–36.0)
MCHC: 33.5 g/dL (ref 30.0–36.0)
MCV: 81.4 fL (ref 78.0–100.0)
MCV: 81 fL (ref 78.0–100.0)
Platelets: 214 10*3/uL (ref 150–400)
Platelets: 222 10*3/uL (ref 150–400)
RBC: 2.2 MIL/uL — AB (ref 3.87–5.11)
RBC: 2.58 MIL/uL — AB (ref 3.87–5.11)
RDW: 15.1 % (ref 11.5–15.5)
RDW: 15 % (ref 11.5–15.5)
WBC: 17 10*3/uL — ABNORMAL HIGH (ref 4.0–10.5)
WBC: 17.2 10*3/uL — ABNORMAL HIGH (ref 4.0–10.5)

## 2013-01-28 LAB — RENAL FUNCTION PANEL
Albumin: 2.5 g/dL — ABNORMAL LOW (ref 3.5–5.2)
BUN: 52 mg/dL — ABNORMAL HIGH (ref 6–23)
CO2: 22 meq/L (ref 19–32)
Calcium: 8.4 mg/dL (ref 8.4–10.5)
Chloride: 101 mEq/L (ref 96–112)
Creatinine, Ser: 6.37 mg/dL — ABNORMAL HIGH (ref 0.50–1.10)
GFR calc Af Amer: 9 mL/min — ABNORMAL LOW (ref >90)
GFR calc non Af Amer: 8 mL/min — ABNORMAL LOW (ref >90)
GLUCOSE: 85 mg/dL (ref 70–99)
Phosphorus: 5 mg/dL — ABNORMAL HIGH (ref 2.3–4.6)
Potassium: 4.1 mEq/L (ref 3.7–5.3)
SODIUM: 137 meq/L (ref 137–147)

## 2013-01-28 LAB — PREPARE RBC (CROSSMATCH)

## 2013-01-28 LAB — BASIC METABOLIC PANEL
BUN: 51 mg/dL — ABNORMAL HIGH (ref 6–23)
CHLORIDE: 102 meq/L (ref 96–112)
CO2: 23 meq/L (ref 19–32)
Calcium: 8.4 mg/dL (ref 8.4–10.5)
Creatinine, Ser: 6.41 mg/dL — ABNORMAL HIGH (ref 0.50–1.10)
GFR calc Af Amer: 9 mL/min — ABNORMAL LOW (ref >90)
GFR calc non Af Amer: 7 mL/min — ABNORMAL LOW (ref >90)
Glucose, Bld: 98 mg/dL (ref 70–99)
Potassium: 4.2 mEq/L (ref 3.7–5.3)
Sodium: 138 mEq/L (ref 137–147)

## 2013-01-28 LAB — APTT: aPTT: 25 seconds (ref 24–37)

## 2013-01-28 LAB — PROTIME-INR
INR: 1.19 (ref 0.00–1.49)
Prothrombin Time: 14.8 seconds (ref 11.6–15.2)

## 2013-01-28 MED ORDER — DARBEPOETIN ALFA-POLYSORBATE 200 MCG/0.4ML IJ SOLN
200.0000 ug | INTRAMUSCULAR | Status: DC
  Administered 2013-01-28: 200 ug via SUBCUTANEOUS
  Filled 2013-01-28: qty 0.4

## 2013-01-28 MED ORDER — SODIUM CHLORIDE 0.9 % IV SOLN
1020.0000 mg | Freq: Once | INTRAVENOUS | Status: AC
  Administered 2013-01-28: 1020 mg via INTRAVENOUS
  Filled 2013-01-28: qty 34

## 2013-01-28 MED ORDER — CARVEDILOL 25 MG PO TABS
25.0000 mg | ORAL_TABLET | Freq: Two times a day (BID) | ORAL | Status: DC
  Administered 2013-01-28 – 2013-01-30 (×4): 25 mg via ORAL
  Filled 2013-01-28 (×7): qty 1

## 2013-01-28 NOTE — Progress Notes (Signed)
VASCULAR LAB PRELIMINARY  PRELIMINARY  PRELIMINARY  PRELIMINARY  Duplex of AV fistula completed.    Preliminary report:  No thrill noted.  Thrombus in cephalic vein form anastomosis into the upper arm for 1-2 inches.  Depth to vein at anastomosis 2.12 mm Depth to vein above AC  12.59mm   Vein measures 8.11 mm Depth to vein in mid upper arm 7.68mm  Vein measures 7.77mm Depth to vein in upper upper arm 14.62mm  Vein measures 4.34mm Depth to vein at shoulder 18.16mm  Vein measures 6.71mm  Ariyan Sinnett, RVT 01/28/2013, 4:06 PM

## 2013-01-28 NOTE — Progress Notes (Signed)
[facility] PROGRESS NOTE  [name] [id number] DOB: 1975-09-23 DOA: 01/27/2013 PCP: Pcp Not In System  Assessment/Plan:  Principal Problem:   Anemia, likely mainly menorrhagia related, but could be kidney disease contributing. Ferritin only 2. Improved after 5 units PRBC.  Bleeding slowing. Transfer to renal floor.  IV iron and aranesp started.  Active Problems:   HYPERTENSION, UNSPECIFIED: resume coreg.    CARDIOMYOPATHY, DILATED: Continue Lasix.    CKD (chronic kidney disease) stage 5, GFR less than 15 ml/min, not yet on dialysis. No signs of fluid overload clinically.  No urgent need for dialysis at this time.  For venogram and AVF mobilization tomorrow.     Syncope secondary to anemia and probable hypotension, none further.    HYPERLIPIDEMIA-MIXED on statin    Menorrhagia: bleeding slowing.  Patient usually has heavy periods, but this is much heavier than usual. In fact, she has been passing clots for about 5 days and requires adult diapers, rather than pads due to the amount of bleeding. Patient has not had a pelvic exam or GYN followup for "many years".  Discussed with Dr. Roselie Awkward, gynecology 1/24. He recommends Megace 20 mg by mouth twice a day, Premarin 25 mg IV x1 and followup in the women's clinic upon discharge. Patient may call [phone] to schedule appointment.  Problem list includes "thyroid disease". TSH minimally low. Would repeat as outpt once medical issues stabilize  Leukocytosis: no fever, signs, symptoms of infection. CXR and UA unimpressive. Likely stress demargination  morbid obesity  Code Status:  full Family Communication:   Disposition Plan:  home  Consultants:  nephrology  Procedures:     Antibiotics:    HPI/Subjective: Bleeding slowed. Weakness resolved. No dyspnea  Objective: Filed Vitals:   01/28/13 0800  BP: 146/77  Pulse: 97  Temp: 97.7 F (36.5 C)  Resp: 15    Intake/Output Summary (Last 24 hours) at 01/28/13  0929 Last data filed at 01/28/13 0925  Gross per 24 hour  Intake 2527.08 ml  Output    690 ml  Net 1837.08 ml   Filed Weights   01/28/13 0408  Weight: 150.4 kg (331 lb 9.2 oz)    Exam:   General:  Comfortable. Watching TV.  Cardiovascular: Regular rate rhythm without murmurs gallops rubs  Respiratory: Regular rate rhythm without murmurs gallops rubs  Abdomen: Obese soft nontender  Ext: Sequential compression devices. No edema  Basic Metabolic Panel:  Recent Labs Lab 01/27/13 0544 01/27/13 1509 01/27/13 2125 01/28/13 0240 01/28/13 0710  NA 140 138 140 138 137  K 4.7 4.2 4.1 4.2 4.1  CL 103 101 103 102 101  CO2 24 21 25 23 22   GLUCOSE 123* 125* 106* 98 85  BUN 42* 45* 49* 51* 52*  CREATININE 6.08* 6.24* 6.42* 6.41* 6.37*  CALCIUM 9.0 8.9 8.4 8.4 8.4  PHOS  --   --   --   --  5.0*   Liver Function Tests:  Recent Labs Lab 01/27/13 0059 01/28/13 0710  AST 7  --   ALT 5  --   ALKPHOS 36*  --   BILITOT <0.2*  --   PROT 6.5  --   ALBUMIN 2.9* 2.5*   No results found for this basename: LIPASE, AMYLASE,  in the last 168 hours No results found for this basename: AMMONIA,  in the last 168 hours CBC:  Recent Labs Lab 01/27/13 0059 01/27/13 0148 01/27/13 0544 01/27/13 1509 01/28/13 0200 01/28/13 0709  WBC 15.7*  --  14.8*  --  17.0* 17.2*  NEUTROABS 11.4*  --   --   --   --   --   HGB 3.7* 4.4* 4.3* 4.8* 6.1* 7.0*  HCT 12.5* 13.0* 13.8* 14.7* 17.9* 20.9*  MCV 81.2  --  80.7  --  81.4 81.0  PLT 279  --  260  --  214 222   Cardiac Enzymes:  Recent Labs Lab 01/27/13 0059  TROPONINI <0.30   BNP (last 3 results) No results found for this basename: PROBNP,  in the last 8760 hours CBG:  Recent Labs Lab 01/27/13 0027  GLUCAP 133*    Recent Results (from the past 240 hour(s))  MRSA PCR SCREENING     Status: None   Collection Time    01/27/13  5:27 AM      Result Value Range Status   MRSA by PCR NEGATIVE  NEGATIVE Final   Comment:             The GeneXpert MRSA Assay (FDA     approved for NASAL specimens     only), is one component of a     comprehensive MRSA colonization     surveillance program. It is not     intended to diagnose MRSA     infection nor to guide or     monitor treatment for     MRSA infections.     Studies: Dg Chest Port 1 View  01/27/2013   CLINICAL DATA:  Syncope.  Dizziness.  Renal failure.  EXAM: PORTABLE CHEST - 1 VIEW  COMPARISON:  Chest radiograph performed 08/03/2011  FINDINGS: The lungs are mildly hypoexpanded. Vascular crowding and vascular congestion are seen, with mildly increased interstitial markings, possibly reflecting mild interstitial edema. There is no evidence of pleural effusion or pneumothorax.  The cardiomediastinal silhouette is mildly enlarged. No acute osseous abnormalities are seen.  IMPRESSION: Lungs mildly hypoexpanded. Vascular congestion and mild cardiomegaly, with mildly increased interstitial markings, possibly reflecting mild interstitial edema.   Electronically Signed   By: Garald Balding M.D.   On: 01/27/2013 02:57    Scheduled Meds: . allopurinol  200 mg Oral Daily  . [START ON 01/29/2013] calcitRIOL  0.25 mcg Oral 3 times weekly  . calcium-vitamin D  3 tablet Oral Q breakfast  . darbepoetin (ARANESP) injection - NON-DIALYSIS  200 mcg Subcutaneous Q Sun-1800  . ferumoxytol  1,020 mg Intravenous Once  . furosemide  80 mg Oral BID  . influenza vac split quadrivalent PF  0.5 mL Intramuscular Tomorrow-1000  . megestrol  20 mg Oral BID  . simvastatin  20 mg Oral q1800  . sodium chloride  3 mL Intravenous Q12H  . sodium chloride  3 mL Intravenous Q12H   Continuous Infusions:   Time spent: 25 minutes  [facility] Pager [phone]. If 7PM-7AM, please contact night-coverage at [email or web address], password [facility] 01/28/2013, 9:29 AM  LOS: 1 day

## 2013-01-28 NOTE — Consult Note (Signed)
Patient name: [name] MRN: [id number] DOB: 22-Jun-1975 Sex: female   Referred by: Deterding  Reason for referral:  Chief Complaint  Patient presents with  . Fall  . Dizziness  . Loss of Consciousness    HISTORY OF PRESENT ILLNESS: Patient is a 38 year old female with a history of chronic renal insufficiency. She was admitted with profound anemia felt to be secondary to menorrhagia. He did have a right brachiocephalic fistula by Dr. Oneida Alar on 08/03/2012. She was seen in our office in October of 2014 at that time had good size maturation of her vein but this was extremely deep due to her morbid obesity. She had been scheduled for superficial mobilization of this by Dr. Oneida Alar in November and the surgery was canceled. She is not an incisional disease but is progressing this with worsening renal failure. We were consulted consult to discuss mobilization.  Past Medical History  Diagnosis Date  . Hypertension   . Thyroid disease   . Morbid obesity   . Hyperlipidemia   . History of pneumonia   . Foot fracture   . Dilated cardiomyopathy   . Chronic kidney disease   . Anxiety   . Pneumonia     hosp. 2010  . CHF (congestive heart failure)     hosp. 2011  . Gout     Past Surgical History  Procedure Laterality Date  . Cardiac surgery  1979    repair of hole in heart  . Av fistula placement  08/04/2011    Procedure: ARTERIOVENOUS (AV) FISTULA CREATION;  Surgeon: Elam Dutch, MD;  Location: [facility] OR;  Service: Vascular;  Laterality: Right;  Creation right brachiocephalic arteriovenous fistula    History   Social History  . Marital Status: Single    Spouse Name: N/A    Number of Children: N/A  . Years of Education: N/A   Occupational History  . Not on file.   Social History Main Topics  . Smoking status: Never Smoker   . Smokeless tobacco: Never Used  . Alcohol Use: Yes     Comment: rare use  . Drug Use: No  . Sexual Activity: Yes    Birth Control/  Protection: None     Comment: no birth control    Other Topics Concern  . Not on file   Social History Narrative  . No narrative on file    Family History  Problem Relation Age of Onset  . Diabetes Mother   . Hyperlipidemia Mother   . Hypertension Mother   . Kidney disease Mother   . Anesthesia problems Neg Hx   . Hypotension Neg Hx   . Malignant hyperthermia Neg Hx   . Pseudochol deficiency Neg Hx     Allergies as of 01/27/2013  . (No Known Allergies)    No current facility-administered medications on file prior to encounter.   Current Outpatient Prescriptions on File Prior to Encounter  Medication Sig Dispense Refill  . allopurinol (ZYLOPRIM) 100 MG tablet Take 100 mg by mouth daily.       Marland Kitchen aspirin EC 81 MG tablet Take 81 mg by mouth daily.       . Calcium Carb-Cholecalciferol (CALCIUM 500 +D PO) Take 3 tablets by mouth daily.      . carvedilol (COREG) 25 MG tablet Take 25 mg by mouth 2 (two) times daily with a meal.      . furosemide (LASIX) 80 MG tablet Take 80 mg by mouth 2 (  two) times daily.      . hydrALAZINE (APRESOLINE) 25 MG tablet Take 100 mg by mouth 2 (two) times daily.      . pravastatin (PRAVACHOL) 40 MG tablet Take 40 mg by mouth daily.          REVIEW OF SYSTEMS: Reviewed in her history and physical with nothing to add  PHYSICAL EXAMINATION:  General: The patient is a well-nourished female, in no acute distress. Morbid obesity Vital signs are BP 146/77  Pulse 97  Temp(Src) 97.7 F (36.5 C) (Oral)  Resp 15  Wt 331 lb 9.2 oz (150.4 kg)  SpO2 100%  LMP 01/27/2013 Pulmonary: There is a good air exchange    Musculoskeletal: There are no major deformities.  There is no significant extremity pain. Neurologic: No focal weakness or paresthesias are detected, Skin: There are no ulcer or rashes noted. Psychiatric: The patient has normal affect. Cardiovascular: 2+ radial pulses bilaterally She does have a thrill at the antecubital space in her right  arm. Due to her obesity I do not have any way of knowing the status of her vein.    Impression and Plan:  Progressive renal insufficiency. Does have a patent fistula but inadequate due to the depth. Will obtain a venogram in the morning to determine patency and size of the fistula. If this is of good caliber will take to the operating room for mobilization. Discussed with the patient.    EARLY, TODD [facility] Office: [phone]

## 2013-01-28 NOTE — Progress Notes (Signed)
Subjective: Interval History: has complaints bleeding less and passing urine.  Objective: Vital signs in last 24 hours: Temp:  [97.7 F (36.5 C)-99.5 F (37.5 C)] 97.7 F (36.5 C) (01/25 0800) Pulse Rate:  [65-97] 97 (01/25 0800) Resp:  [7-23] 15 (01/25 0800) BP: (96-162)/(35-81) 146/77 mmHg (01/25 0800) SpO2:  [94 %-100 %] 100 % (01/25 0800) Weight:  [150.4 kg (331 lb 9.2 oz)] 150.4 kg (331 lb 9.2 oz) (01/25 0408) Weight change:   Intake/Output from previous day: 01/24 0701 - 01/25 0700 In: 2644.1 [P.O.:1310; Blood:1334.1] Out: 720 [Urine:630; Blood:90] Intake/Output this shift:    General appearance: alert, cooperative and morbidly obese Resp: diminished breath sounds bilaterally Cardio: S1, S2 normal and systolic murmur: holosystolic 2/6, blowing at apex GI: obese,pos bs, soft, liver down 5 cm Extremities: AVF RUA deep and medial  Lab Results:  Recent Labs  01/28/13 0200 01/28/13 0709  WBC 17.0* 17.2*  HGB 6.1* 7.0*  HCT 17.9* 20.9*  PLT 214 222   BMET:  Recent Labs  01/28/13 0240 01/28/13 0710  NA 138 137  K 4.2 4.1  CL 102 101  CO2 23 22  GLUCOSE 98 85  BUN 51* 52*  CREATININE 6.41* 6.37*  CALCIUM 8.4 8.4   No results found for this basename: PTH,  in the last 72 hours Iron Studies:  Recent Labs  01/27/13 1509  IRON 39*  TIBC 346  FERRITIN 2*    Studies/Results: Dg Chest Port 1 View  01/27/2013   CLINICAL DATA:  Syncope.  Dizziness.  Renal failure.  EXAM: PORTABLE CHEST - 1 VIEW  COMPARISON:  Chest radiograph performed 08/03/2011  FINDINGS: The lungs are mildly hypoexpanded. Vascular crowding and vascular congestion are seen, with mildly increased interstitial markings, possibly reflecting mild interstitial edema. There is no evidence of pleural effusion or pneumothorax.  The cardiomediastinal silhouette is mildly enlarged. No acute osseous abnormalities are seen.  IMPRESSION: Lungs mildly hypoexpanded. Vascular congestion and mild cardiomegaly,  with mildly increased interstitial markings, possibly reflecting mild interstitial edema.   Electronically Signed   By: Garald Balding M.D.   On: 01/27/2013 02:57    I have reviewed the patient's current medications.  Assessment/Plan: 1 CKD5 stable.  ? I&O.  Vol ok. Acid/base/K ok with excellent monitoring.  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 2 Anemia tx up to 7. Will add epo and replete Fe 3 Obesity  4 HTN not an issue  P epo/Fe, VVS to see, keep Hb up,   LOS: 1 day   Novalyn Lajara L 01/28/2013,8:46 AM

## 2013-01-29 ENCOUNTER — Encounter (HOSPITAL_COMMUNITY)
Admission: EM | Disposition: A | Discharge status: Home / Self Care | Payer: Self-pay | Source: Home / Self Care | Attending: Internal Medicine

## 2013-01-29 ENCOUNTER — Encounter (HOSPITAL_COMMUNITY): Payer: Self-pay | Admitting: Certified Registered"

## 2013-01-29 DIAGNOSIS — Z01810 Encounter for preprocedural cardiovascular examination: Secondary | ICD-10-CM

## 2013-01-29 LAB — RENAL FUNCTION PANEL
Albumin: 2.7 g/dL — ABNORMAL LOW (ref 3.5–5.2)
BUN: 56 mg/dL — AB (ref 6–23)
CHLORIDE: 100 meq/L (ref 96–112)
CO2: 22 mEq/L (ref 19–32)
CREATININE: 6.59 mg/dL — AB (ref 0.50–1.10)
Calcium: 8.7 mg/dL (ref 8.4–10.5)
GFR calc Af Amer: 8 mL/min — ABNORMAL LOW (ref >90)
GFR calc non Af Amer: 7 mL/min — ABNORMAL LOW (ref >90)
GLUCOSE: 83 mg/dL (ref 70–99)
Phosphorus: 5 mg/dL — ABNORMAL HIGH (ref 2.3–4.6)
Potassium: 3.8 mEq/L (ref 3.7–5.3)
Sodium: 140 mEq/L (ref 137–147)

## 2013-01-29 LAB — CBC
HEMATOCRIT: 21.9 % — AB (ref 36.0–46.0)
HEMOGLOBIN: 7.4 g/dL — AB (ref 12.0–15.0)
MCH: 27.8 pg (ref 26.0–34.0)
MCHC: 33.8 g/dL (ref 30.0–36.0)
MCV: 82.3 fL (ref 78.0–100.0)
Platelets: 245 10*3/uL (ref 150–400)
RBC: 2.66 MIL/uL — AB (ref 3.87–5.11)
RDW: 15.7 % — ABNORMAL HIGH (ref 11.5–15.5)
WBC: 18.4 10*3/uL — AB (ref 4.0–10.5)

## 2013-01-29 SURGERY — REVISON OF ARTERIOVENOUS FISTULA
Anesthesia: General | Laterality: Right

## 2013-01-29 NOTE — Progress Notes (Signed)
[facility] Progress Note   Subjective:   No complaints today, feeling much improved with transfusion    Objective:   BP 122/71  Pulse 72  Temp(Src) 99 F (37.2 C) (Oral)  Resp 20  Wt 331 lb 9.2 oz (150.401 kg)  SpO2 97%  LMP 01/27/2013  Intake/Output Summary (Last 24 hours) at 01/29/13 0824 Last data filed at 01/29/13 0404  Gross per 24 hour  Intake   1220 ml  Output      0 ml  Net   1220 ml   Weight change: 0.1 oz (0.001 kg)  Physical Exam: General appearance: alert, cooperative and morbidly obese  Resp: diminished breath sounds bilaterally  Cardio: S1, S2 normal and systolic murmur: holosystolic 2/6, blowing at apex  GI: obese, pos bs, soft Extremities: AVF RUA deep and medial, non palpable thrill   Imaging: No results found.  Labs: BMET  Recent Labs Lab 01/27/13 0059 01/27/13 0148 01/27/13 0544 01/27/13 1509 01/27/13 2125 01/28/13 0240 01/28/13 0710 01/29/13 0524  NA 139 140 140 138 140 138 137 140  K 4.3 4.0 4.7 4.2 4.1 4.2 4.1 3.8  CL 102 103 103 101 103 102 101 100  CO2 26  --  24 21 25 23 22 22   GLUCOSE 125* 117* 123* 125* 106* 98 85 83  BUN 42* 46* 42* 45* 49* 51* 52* 56*  CREATININE 6.44* 6.50* 6.08* 6.24* 6.42* 6.41* 6.37* 6.59*  CALCIUM 8.6  --  9.0 8.9 8.4 8.4 8.4 8.7  PHOS  --   --   --   --   --   --  5.0* 5.0*   CBC  Recent Labs Lab 01/27/13 0059  01/27/13 0544 01/27/13 1509 01/28/13 0200 01/28/13 0709 01/29/13 0524  WBC 15.7*  --  14.8*  --  17.0* 17.2* 18.4*  NEUTROABS 11.4*  --   --   --   --   --   --   HGB 3.7*  < > 4.3* 4.8* 6.1* 7.0* 7.4*  HCT 12.5*  < > 13.8* 14.7* 17.9* 20.9* 21.9*  MCV 81.2  --  80.7  --  81.4 81.0 82.3  PLT 279  --  260  --  214 222 245  < > = values in this interval not displayed.  Medications:    . allopurinol  200 mg Oral Daily  . calcitRIOL  0.25 mcg Oral 3 times weekly  . calcium-vitamin D  3 tablet Oral Q breakfast  . carvedilol  25 mg Oral BID WC  . darbepoetin (ARANESP)  injection - NON-DIALYSIS  200 mcg Subcutaneous Q Sun-1800  . furosemide  80 mg Oral BID  . influenza vac split quadrivalent PF  0.5 mL Intramuscular Tomorrow-1000  . megestrol  20 mg Oral BID  . simvastatin  20 mg Oral q1800  . sodium chloride  3 mL Intravenous Q12H  . sodium chloride  3 mL Intravenous Q12H      Assessment/ Plan:   [name] is a 38 y.o. female history of chronic kidney disease/ESRD, hypertension, cardiomyopathy, hyperlipidemia admitted for lost of consciousness found to have Hgb of 3.7.  1) CKD stage V/ESRD - VVS on board, UE duplex showing thrombosed fistula on R.  Will need repeat vein mapping prior to perm cath in LUE.  2) Severe Normocytic Anemia - May be combination of renal disease with acute process (excess menorrhagia) as RDW is increased. Vitals stable.  Iron studies consistent with Iron deficiency anemia  3) Menorrhagia -  Per primary, Megace/Premarin and will need gyn evaluation  4) Metabolic Bone Disease - Continue on Calcitriol.   5) Hx of gout - on allopurinol  6) HTN - Hold home medications in setting of ABL  7) Hyperlipidemia - Continue Pravachol   Plan  1) Monitor I/O, BP's 2) Continue on Aranesp, will need feraheme in two days (last dose 1/25)  3) Trend creatinine and renal panel  4) F/U vein mapping and perm access per vascular surgery, appreciate recs.    Nolon Rod, DO PGY-2, [facility] 01/29/2013, 8:24 AM  I have seen and examined this patient and agree with plan and recommendations as outlined in the above note.  Vag bleeding less, Hb stable post 5 unit transfusion, right AVF thrombosed (appears acutely so, as painful in antecub).  VVS re-evaluating permanent access options. No indications for dialysis at this time.  Jericho Cieslik B,MD 01/29/2013 1:01 PM

## 2013-01-29 NOTE — Progress Notes (Signed)
[facility] PROGRESS NOTE  [name] [id number] DOB: 1975-03-17 DOA: 01/27/2013 PCP: Pcp Not In System  Assessment/Plan:  Principal Problem:   Anemia, likely mainly menorrhagia related, but could be kidney disease contributing. Ferritin only 2. Improved after 5 units PRBC.  Bleeding slowing. IV iron and aranesp started.  Active Problems:   HYPERTENSION, UNSPECIFIED: controlled    CARDIOMYOPATHY, DILATED: Continue Lasix.    CKD (chronic kidney disease) stage 5, GFR less than 15 ml/min, not yet on dialysis. No signs of fluid overload clinically.  No urgent need for dialysis at this time.  Right arm AVF with thrombus. For vein mapping left arm.    Syncope secondary to anemia and probable hypotension, none further.    HYPERLIPIDEMIA-MIXED on statin    Menorrhagia: bleeding slowing.  Patient usually has heavy periods, but this is much heavier than usual. In fact, she has been passing clots for about 5 days and requires adult diapers, rather than pads due to the amount of bleeding. Patient has not had a pelvic exam or GYN followup for "many years".  Discussed with Dr. Roselie Awkward, gynecology 1/24. He recommends Megace 20 mg by mouth twice a day, Premarin 25 mg IV x1 and followup in the women's clinic upon discharge. Patient may call [phone] to schedule appointment.  Problem list includes "thyroid disease". TSH minimally low. Would repeat as outpt once medical issues stabilize  Leukocytosis: no fever, signs, symptoms of infection. CXR and UA unimpressive. Likely stress demargination  morbid obesity  Code Status:  full Family Communication:   Disposition Plan:  home  Consultants:  Nephrology  VVS  Procedures:     Antibiotics:    HPI/Subjective: Bleeding slowed. Weakness resolved. No dyspnea  Objective: Filed Vitals:   01/29/13 1000  BP: 124/73  Pulse: 73  Temp: 97.8 F (36.6 C)  Resp: 20    Intake/Output Summary (Last 24 hours) at 01/29/13 1458 Last  data filed at 01/29/13 0404  Gross per 24 hour  Intake    600 ml  Output      0 ml  Net    600 ml   Filed Weights   01/28/13 0408 01/28/13 2147  Weight: 150.4 kg (331 lb 9.2 oz) 150.401 kg (331 lb 9.2 oz)    Exam:   General:  Comfortable. Watching TV.  Cardiovascular: Regular rate rhythm without murmurs gallops rubs  Respiratory: Regular rate rhythm without murmurs gallops rubs  Abdomen: Obese soft nontender  Ext: Sequential compression devices. No edema  Basic Metabolic Panel:  Recent Labs Lab 01/27/13 1509 01/27/13 2125 01/28/13 0240 01/28/13 0710 01/29/13 0524  NA 138 140 138 137 140  K 4.2 4.1 4.2 4.1 3.8  CL 101 103 102 101 100  CO2 21 25 23 22 22   GLUCOSE 125* 106* 98 85 83  BUN 45* 49* 51* 52* 56*  CREATININE 6.24* 6.42* 6.41* 6.37* 6.59*  CALCIUM 8.9 8.4 8.4 8.4 8.7  PHOS  --   --   --  5.0* 5.0*   Liver Function Tests:  Recent Labs Lab 01/27/13 0059 01/28/13 0710 01/29/13 0524  AST 7  --   --   ALT 5  --   --   ALKPHOS 36*  --   --   BILITOT <0.2*  --   --   PROT 6.5  --   --   ALBUMIN 2.9* 2.5* 2.7*   No results found for this basename: LIPASE, AMYLASE,  in the last 168 hours No results found for this  basename: AMMONIA,  in the last 168 hours CBC:  Recent Labs Lab 01/27/13 0059  01/27/13 0544 01/27/13 1509 01/28/13 0200 01/28/13 0709 01/29/13 0524  WBC 15.7*  --  14.8*  --  17.0* 17.2* 18.4*  NEUTROABS 11.4*  --   --   --   --   --   --   HGB 3.7*  < > 4.3* 4.8* 6.1* 7.0* 7.4*  HCT 12.5*  < > 13.8* 14.7* 17.9* 20.9* 21.9*  MCV 81.2  --  80.7  --  81.4 81.0 82.3  PLT 279  --  260  --  214 222 245  < > = values in this interval not displayed. Cardiac Enzymes:  Recent Labs Lab 01/27/13 0059  TROPONINI <0.30   BNP (last 3 results) No results found for this basename: PROBNP,  in the last 8760 hours CBG:  Recent Labs Lab 01/27/13 0027  GLUCAP 133*    Recent Results (from the past 240 hour(s))  MRSA PCR SCREENING      Status: None   Collection Time    01/27/13  5:27 AM      Result Value Range Status   MRSA by PCR NEGATIVE  NEGATIVE Final   Comment:            The GeneXpert MRSA Assay (FDA     approved for NASAL specimens     only), is one component of a     comprehensive MRSA colonization     surveillance program. It is not     intended to diagnose MRSA     infection nor to guide or     monitor treatment for     MRSA infections.     Studies: No results found.  Scheduled Meds: . allopurinol  200 mg Oral Daily  . calcitRIOL  0.25 mcg Oral 3 times weekly  . calcium-vitamin D  3 tablet Oral Q breakfast  . carvedilol  25 mg Oral BID WC  . darbepoetin (ARANESP) injection - NON-DIALYSIS  200 mcg Subcutaneous Q Sun-1800  . furosemide  80 mg Oral BID  . megestrol  20 mg Oral BID  . simvastatin  20 mg Oral q1800  . sodium chloride  3 mL Intravenous Q12H  . sodium chloride  3 mL Intravenous Q12H   Continuous Infusions:   Time spent: 25 minutes  [facility] Pager [phone]. If 7PM-7AM, please contact night-coverage at [email or web address], password [facility] 01/29/2013, 2:58 PM  LOS: 2 days

## 2013-01-29 NOTE — Progress Notes (Signed)
Two PIV's removed from pt's LUE per nephrology. Pt to have vein mapping done. Saving LUE for future AVG/AVF placement.   [name], [name]

## 2013-01-29 NOTE — Progress Notes (Signed)
   CARE MANAGEMENT NOTE 01/29/2013  Patient:  [name], [name]   Account Number:  [id number]  Date Initiated:  01/29/2013  Documentation initiated by:  Lizabeth Leyden  Subjective/Objective Assessment:   admitted s/p syncopal episode at home  Hgb 3.7     Action/Plan:   progression of care and discharge planning   Anticipated DC Date:  01/31/2013   Anticipated DC Plan:           Choice offered to / List presented to:             Status of service:  In process, will continue to follow Medicare Important Message given?   (If response is "NO", the following Medicare IM given date fields will be blank) Date Medicare IM given:   Date Additional Medicare IM given:    Discharge Disposition:    Per UR Regulation:    If discussed at Long Length of Stay Meetings, dates discussed:    Comments:

## 2013-01-29 NOTE — Progress Notes (Signed)
Patient ID: [name], female   DOB: 08/30/1975, 38 y.o.   MRN: [id number] The patient's surgery was canceled for today. She had thrombus in the antecubital region of her right upper arm AV fistula. I discussed this with the patient. We will obtain a vein map of her left arm to determine if this is acceptable for access.  Suspect she will have difficulty due to her morbid obesity. Plan access following left arm vein map. Have written an order to restrict use of left arm with no IV or blood pressure or venipuncture. Okay to use right arm

## 2013-01-29 NOTE — Progress Notes (Signed)
Left  Upper Extremity Vein Map  Cephalic  Segment Diameter Depth Comment  1. Axilla 4.26mm mm   2. Mid upper arm 4.83mm mm   3. Above AC 4.25mm mm   4. In [facility] 6.85mm mm thrombosis  5. Below AC 3.47mm mm   6. Mid forearm 2.82mm mm   7. Wrist 2.57mm mm    mm mm    mm mm    mm mm    Basilic  Segment Diameter Depth Comment  1. Axilla mm mm   2. Mid upper arm 4.27mm 26.31mm   3. Above [facility] 3.78mm 23.49mm   4. In [facility] 3.35mm 16.41mm branch  5. Below AC 2.39mm 2.79mm   6. Mid forearm 1.29mm 2.61mm   7. Wrist 1.27mm mm    mm mm    mm mm    mm mm    [name], RDMS, RVT 01/29/2013

## 2013-01-30 DIAGNOSIS — D72829 Elevated white blood cell count, unspecified: Secondary | ICD-10-CM

## 2013-01-30 LAB — RENAL FUNCTION PANEL
Albumin: 2.6 g/dL — ABNORMAL LOW (ref 3.5–5.2)
BUN: 57 mg/dL — AB (ref 6–23)
CO2: 22 mEq/L (ref 19–32)
Calcium: 8.4 mg/dL (ref 8.4–10.5)
Chloride: 100 mEq/L (ref 96–112)
Creatinine, Ser: 6.6 mg/dL — ABNORMAL HIGH (ref 0.50–1.10)
GFR calc non Af Amer: 7 mL/min — ABNORMAL LOW (ref >90)
GFR, EST AFRICAN AMERICAN: 8 mL/min — AB (ref >90)
GLUCOSE: 85 mg/dL (ref 70–99)
Phosphorus: 5.1 mg/dL — ABNORMAL HIGH (ref 2.3–4.6)
Potassium: 4.1 mEq/L (ref 3.7–5.3)
Sodium: 138 mEq/L (ref 137–147)

## 2013-01-30 LAB — CBC
HCT: 21.5 % — ABNORMAL LOW (ref 36.0–46.0)
HEMOGLOBIN: 7 g/dL — AB (ref 12.0–15.0)
MCH: 27.5 pg (ref 26.0–34.0)
MCHC: 32.6 g/dL (ref 30.0–36.0)
MCV: 84.3 fL (ref 78.0–100.0)
Platelets: 269 10*3/uL (ref 150–400)
RBC: 2.55 MIL/uL — ABNORMAL LOW (ref 3.87–5.11)
RDW: 16 % — ABNORMAL HIGH (ref 11.5–15.5)
WBC: 21.2 10*3/uL — ABNORMAL HIGH (ref 4.0–10.5)

## 2013-01-30 MED ORDER — MEGESTROL ACETATE 20 MG PO TABS: 20.0000 mg | ORAL_TABLET | Freq: Two times a day (BID) | ORAL | Status: DC

## 2013-01-30 MED ORDER — FERROUS SULFATE 325 (65 FE) MG PO TBEC: 325.0000 mg | DELAYED_RELEASE_TABLET | Freq: Two times a day (BID) | ORAL | Status: DC

## 2013-01-30 NOTE — Discharge Summary (Signed)
Physician Discharge Summary  [name] [id number] DOB: 11-18-1975 DOA: 01/27/2013  PCP: Pcp Not In System  Admit date: 01/27/2013 Discharge date: 01/30/2013  Time spent: greater than 30 min  Recommendations for Outpatient Follow-up:  1. Monitor H/H 2. F/u gynecology  Discharge Diagnoses:  Principal Problem:   Anemia Active Problems:   HYPERTENSION, UNSPECIFIED   CARDIOMYOPATHY, DILATED   CKD (chronic kidney disease) stage 5, GFR less than 15 ml/min   Syncope   HYPERLIPIDEMIA-MIXED   OBESITY-MORBID (>100')   Menorrhagia   Leukocytosis, unspecified   Discharge Condition: stable  Filed Weights   01/28/13 0408 01/28/13 2147 01/29/13 2014  Weight: 150.4 kg (331 lb 9.2 oz) 150.401 kg (331 lb 9.2 oz) 150.401 kg (331 lb 9.2 oz)    History of present illness:  38 y.o. female history of chronic kidney disease/ESRD, hypertension, cardiomyopathy, hyperlipidemia last evening while walking to the bathroom felt dizzy and lost consciousness. She does not recall how long she lost consciousness but regained consciousness spontaneously. Did not hit her head or have any focal deficits seizure-like activities or incontinence of urine or tongue bite. Denies any chest pain or shortness of breath. Since yesterday morning patient was feeling very weak. In the ER patient's hemoglobin was found to be around 3.7. Patient states that over the last few months patient has been having severe menorrhagia. Denies having used any NSAIDs or having any rectal bleeding or did not throw up any blood. Patient has known history of chronic disease and creatinine at this time is around 6 patient does not have any hyperkalemia and does not look to be in obvious fluid overload. Patient will be admitted for transfusion and further management. Since patient has chronic kidney disease/ESRD patient will be transferred to [facility], patient is agreeable to the plan.   Hospital Course:  Patient admitted to SDU  at [facility]  Received a total of 5 units PRBC.  No further syncope.  Nephrology consulted. No problems with fluid overload or hyperkalemia to necessitate dialysis.  Discussed with  Dr. Roselie Awkward, on call for gynecology who recommended megace 20 mg bid and a single dose of IV premarin, and outpt f/u in [facility].  This was given and patient's bleeding stopped. By discharge, hgb stable for several days above 7.  Nephrology consulted VVS for revision of left arm avf.  Doppler showed thrombosis, so vein mapping completed on the left arm and pt will follow up with Dr. Oneida Alar.  Save left arm, no sticks or IVs.  Procedures:  none  Consultations:  Nephrology  VVS  Gyn by phone  Discharge Exam: Filed Vitals:   01/30/13 1314  BP: 102/50  Pulse: 62  Temp: 98 F (36.7 C)  Resp: 18    General: alert Cardiovascular: RRR Respiratory: CTA  Discharge Instructions  Discharge Orders   Future Orders Complete By Expires   Activity as tolerated - No restrictions  As directed    Diet - low sodium heart healthy  As directed        Medication List    STOP taking these medications       aspirin EC 81 MG tablet     hydrALAZINE 25 MG tablet  Commonly known as:  APRESOLINE      TAKE these medications       allopurinol 100 MG tablet  Commonly known as:  ZYLOPRIM  Take 100 mg by mouth daily.     calcitRIOL 0.25 MCG capsule  Commonly known as:  ROCALTROL  Take 0.25 mcg by mouth 3 (three) times a week. Monday Wednesday and Friday     CALCIUM 500 +D PO  Take 3 tablets by mouth daily.     carvedilol 25 MG tablet  Commonly known as:  COREG  Take 25 mg by mouth 2 (two) times daily with a meal.     ferrous sulfate 325 (65 FE) MG EC tablet  Take 1 tablet (325 mg total) by mouth 2 (two) times daily.     furosemide 80 MG tablet  Commonly known as:  LASIX  Take 80 mg by mouth 2 (two) times daily.     megestrol 20 MG tablet  Commonly known as:  MEGACE  Take 1 tablet (20 mg total)  by mouth 2 (two) times daily.     pravastatin 40 MG tablet  Commonly known as:  PRAVACHOL  Take 40 mg by mouth daily.       No Known Allergies     Follow-up Information   Schedule an appointment as soon as possible for a visit with [place].   Contact information:   [place] [address] [phone]      Follow up with Elam Dutch, MD. (his office will call you with appointment)    Specialty:  Vascular Surgery   Contact information:   [place] [address] [phone]       Follow up with Maximino Greenland, MD. (or primary care provider of choice)    Specialty:  Internal Medicine   Contact information:   [place]  [address] [phone]        The results of significant diagnostics from this hospitalization (including imaging, microbiology, ancillary and laboratory) are listed below for reference.    Significant Diagnostic Studies: Dg Chest Port 1 View  01/27/2013   CLINICAL DATA:  Syncope.  Dizziness.  Renal failure.  EXAM: PORTABLE CHEST - 1 VIEW  COMPARISON:  Chest radiograph performed 08/03/2011  FINDINGS: The lungs are mildly hypoexpanded. Vascular crowding and vascular congestion are seen, with mildly increased interstitial markings, possibly reflecting mild interstitial edema. There is no evidence of pleural effusion or pneumothorax.  The cardiomediastinal silhouette is mildly enlarged. No acute osseous abnormalities are seen.  IMPRESSION: Lungs mildly hypoexpanded. Vascular congestion and mild cardiomegaly, with mildly increased interstitial markings, possibly reflecting mild interstitial edema.   Electronically Signed   By: Garald Balding M.D.   On: 01/27/2013 02:57   EKG Sinus rhythm Probable left ventricular hypertrophy Prolonged QT interval  Microbiology: Recent Results (from the past 240 hour(s))  MRSA PCR SCREENING     Status: None   Collection Time    01/27/13  5:27 AM       Result Value Range Status   MRSA by PCR NEGATIVE  NEGATIVE Final   Comment:            The GeneXpert MRSA Assay (FDA     approved for NASAL specimens     only), is one component of a     comprehensive MRSA colonization     surveillance program. It is not     intended to diagnose MRSA     infection nor to guide or     monitor treatment for     MRSA infections.     Labs: Basic Metabolic Panel:  Recent Labs Lab 01/27/13 2125 01/28/13 0240 01/28/13 0710 01/29/13 0524 01/30/13 0623  NA 140 138 137 140 138  K  4.1 4.2 4.1 3.8 4.1  CL 103 102 101 100 100  CO2 25 23 22 22 22   GLUCOSE 106* 98 85 83 85  BUN 49* 51* 52* 56* 57*  CREATININE 6.42* 6.41* 6.37* 6.59* 6.60*  CALCIUM 8.4 8.4 8.4 8.7 8.4  PHOS  --   --  5.0* 5.0* 5.1*   Liver Function Tests:  Recent Labs Lab 01/27/13 0059 01/28/13 0710 01/29/13 0524 01/30/13 0623  AST 7  --   --   --   ALT 5  --   --   --   ALKPHOS 36*  --   --   --   BILITOT <0.2*  --   --   --   PROT 6.5  --   --   --   ALBUMIN 2.9* 2.5* 2.7* 2.6*   No results found for this basename: LIPASE, AMYLASE,  in the last 168 hours No results found for this basename: AMMONIA,  in the last 168 hours CBC:  Recent Labs Lab 01/27/13 0059  01/27/13 0544 01/27/13 1509 01/28/13 0200 01/28/13 0709 01/29/13 0524 01/30/13 0623  WBC 15.7*  --  14.8*  --  17.0* 17.2* 18.4* 21.2*  NEUTROABS 11.4*  --   --   --   --   --   --   --   HGB 3.7*  < > 4.3* 4.8* 6.1* 7.0* 7.4* 7.0*  HCT 12.5*  < > 13.8* 14.7* 17.9* 20.9* 21.9* 21.5*  MCV 81.2  --  80.7  --  81.4 81.0 82.3 84.3  PLT 279  --  260  --  214 222 245 269  < > = values in this interval not displayed. Cardiac Enzymes:  Recent Labs Lab 01/27/13 0059  TROPONINI <0.30   BNP: BNP (last 3 results) No results found for this basename: PROBNP,  in the last 8760 hours CBG:  Recent Labs Lab 01/27/13 0027  GLUCAP 133*    Signed:  Edie Darley L  [facility] 01/30/2013, 2:41  PM

## 2013-01-30 NOTE — Progress Notes (Signed)
[facility] Progress Note   Subjective:   Pt feeling well today, denies hypotension or HA, blurred vision.    Objective:   BP 111/57  Pulse 75  Temp(Src) 99.5 F (37.5 C) (Oral)  Resp 20  Ht 5\' 9"  (1.753 m)  Wt 331 lb 9.2 oz (150.401 kg)  BMI 48.94 kg/m2  SpO2 92%  LMP 01/27/2013  Intake/Output Summary (Last 24 hours) at 01/30/13 0816 Last data filed at 01/30/13 0250  Gross per 24 hour  Intake    480 ml  Output      0 ml  Net    480 ml   Weight change: 0 lb (0 kg)  Physical Exam: General appearance: alert, cooperative and morbidly obese  Resp: diminished breath sounds bilaterally  Cardio: S1, S2 normal and systolic murmur: holosystolic 2/6 GI: obese, pos bs, soft Extremities: AVF RUA deep and medial, non palpable thrill   Imaging: No results found.  Labs: BMET  Recent Labs Lab 01/27/13 0544 01/27/13 1509 01/27/13 2125 01/28/13 0240 01/28/13 0710 01/29/13 0524 01/30/13 0623  NA 140 138 140 138 137 140 138  K 4.7 4.2 4.1 4.2 4.1 3.8 4.1  CL 103 101 103 102 101 100 100  CO2 24 21 25 23 22 22 22   GLUCOSE 123* 125* 106* 98 85 83 85  BUN 42* 45* 49* 51* 52* 56* 57*  CREATININE 6.08* 6.24* 6.42* 6.41* 6.37* 6.59* 6.60*  CALCIUM 9.0 8.9 8.4 8.4 8.4 8.7 8.4  PHOS  --   --   --   --  5.0* 5.0* 5.1*   CBC  Recent Labs Lab 01/27/13 0059  01/27/13 0544 01/27/13 1509 01/28/13 0200 01/28/13 0709 01/29/13 0524  WBC 15.7*  --  14.8*  --  17.0* 17.2* 18.4*  NEUTROABS 11.4*  --   --   --   --   --   --   HGB 3.7*  < > 4.3* 4.8* 6.1* 7.0* 7.4*  HCT 12.5*  < > 13.8* 14.7* 17.9* 20.9* 21.9*  MCV 81.2  --  80.7  --  81.4 81.0 82.3  PLT 279  --  260  --  214 222 245  < > = values in this interval not displayed.  Medications:    . allopurinol  200 mg Oral Daily  . calcitRIOL  0.25 mcg Oral 3 times weekly  . calcium-vitamin D  3 tablet Oral Q breakfast  . carvedilol  25 mg Oral BID WC  . darbepoetin (ARANESP) injection - NON-DIALYSIS  200 mcg  Subcutaneous Q Sun-1800  . furosemide  80 mg Oral BID  . megestrol  20 mg Oral BID  . simvastatin  20 mg Oral q1800  . sodium chloride  3 mL Intravenous Q12H  . sodium chloride  3 mL Intravenous Q12H      Assessment/ Plan:   [name] is a 38 y.o. female history of chronic kidney disease/ESRD, hypertension, cardiomyopathy, hyperlipidemia admitted for lost of consciousness found to have Hgb of 3.7.  1) CKD stage V/ESRD - VVS on board, UE duplex showing thrombosed fistula on R.  Vein Mapping completed 1/26 2) Severe Normocytic Anemia - May be combination of renal disease with acute process (excess menorrhagia) as RDW is increased. Vitals stable.  Iron studies consistent with Iron deficiency anemia  3) Menorrhagia - Per primary, Megace/Premarin and will need gyn evaluation  4) Metabolic Bone Disease - Continue on Calcitriol.   5) Hx of gout - on allopurinol  6)  HTN - Hold home medications in setting of ABL  7) Hyperlipidemia - Continue Pravachol   Plan  1) Monitor I/O, BP's 2) Continue on Aranesp, will need feraheme in one day (last dose 1/25)  3) Trend creatinine and renal panel  4) Perm access per vascular surgery, appreciate recs.    Abigail Rod, DO PGY-2, [facility] 01/30/2013, 8:16 AM I have seen and examined this patient and agree with plan as outlined in the above note.  Hb stable, no further vag bleeding.  Creatinine stable in the 6's with no uremic symptoms or acute dialysis indications. VVS dealing with permanent access options.  Once access plan in place would be OK from my standpoint for discharge, with outpt f/u.  [name] B,MD 01/30/2013 12:23 PM

## 2013-01-31 LAB — TYPE AND SCREEN
ABO/RH(D): A POS
ABO/RH(D): A POS
ANTIBODY SCREEN: NEGATIVE
Antibody Screen: NEGATIVE
UNIT DIVISION: 0
UNIT DIVISION: 0
UNIT DIVISION: 0
Unit division: 0
Unit division: 0
Unit division: 0
Unit division: 0
Unit division: 0
Unit division: 0
Unit division: 0

## 2013-02-01 ENCOUNTER — Encounter (HOSPITAL_COMMUNITY): Payer: Self-pay | Admitting: Pharmacy Technician

## 2013-02-01 ENCOUNTER — Other Ambulatory Visit: Payer: Self-pay | Admitting: *Deleted

## 2013-02-02 ENCOUNTER — Other Ambulatory Visit (HOSPITAL_COMMUNITY): Payer: Self-pay | Admitting: *Deleted

## 2013-02-02 ENCOUNTER — Encounter (HOSPITAL_COMMUNITY): Payer: Self-pay | Admitting: *Deleted

## 2013-02-04 MED ORDER — DEXTROSE 5 % IV SOLN
1.5000 g | INTRAVENOUS | Status: AC
  Administered 2013-02-05: 1.5 g via INTRAVENOUS
  Filled 2013-02-04: qty 1.5

## 2013-02-05 ENCOUNTER — Ambulatory Visit (HOSPITAL_COMMUNITY)
Admission: RE | Admit: 2013-02-05 | Discharge: 2013-02-05 | Disposition: A | Discharge status: Home / Self Care | Payer: BC Managed Care – PPO | Source: Ambulatory Visit | Attending: Vascular Surgery | Admitting: Vascular Surgery

## 2013-02-05 ENCOUNTER — Ambulatory Visit (HOSPITAL_COMMUNITY): Payer: BC Managed Care – PPO | Admitting: Certified Registered Nurse Anesthetist

## 2013-02-05 ENCOUNTER — Encounter (HOSPITAL_COMMUNITY): Payer: BC Managed Care – PPO | Admitting: Certified Registered Nurse Anesthetist

## 2013-02-05 ENCOUNTER — Other Ambulatory Visit: Payer: Self-pay

## 2013-02-05 ENCOUNTER — Encounter (HOSPITAL_COMMUNITY)
Admission: RE | Disposition: A | Discharge status: Home / Self Care | Payer: Self-pay | Source: Ambulatory Visit | Attending: Vascular Surgery

## 2013-02-05 ENCOUNTER — Encounter (HOSPITAL_COMMUNITY): Payer: Self-pay | Admitting: Anesthesiology

## 2013-02-05 ENCOUNTER — Telehealth: Payer: Self-pay | Admitting: Vascular Surgery

## 2013-02-05 ENCOUNTER — Ambulatory Visit (HOSPITAL_COMMUNITY): Payer: BC Managed Care – PPO

## 2013-02-05 DIAGNOSIS — N186 End stage renal disease: Secondary | ICD-10-CM

## 2013-02-05 DIAGNOSIS — N185 Chronic kidney disease, stage 5: Secondary | ICD-10-CM

## 2013-02-05 DIAGNOSIS — Z48812 Encounter for surgical aftercare following surgery on the circulatory system: Secondary | ICD-10-CM

## 2013-02-05 DIAGNOSIS — F411 Generalized anxiety disorder: Secondary | ICD-10-CM | POA: Insufficient documentation

## 2013-02-05 DIAGNOSIS — I120 Hypertensive chronic kidney disease with stage 5 chronic kidney disease or end stage renal disease: Secondary | ICD-10-CM | POA: Insufficient documentation

## 2013-02-05 DIAGNOSIS — Z7982 Long term (current) use of aspirin: Secondary | ICD-10-CM | POA: Insufficient documentation

## 2013-02-05 DIAGNOSIS — I428 Other cardiomyopathies: Secondary | ICD-10-CM | POA: Insufficient documentation

## 2013-02-05 DIAGNOSIS — E785 Hyperlipidemia, unspecified: Secondary | ICD-10-CM | POA: Insufficient documentation

## 2013-02-05 DIAGNOSIS — I509 Heart failure, unspecified: Secondary | ICD-10-CM | POA: Insufficient documentation

## 2013-02-05 HISTORY — DX: Anemia, unspecified: D64.9

## 2013-02-05 HISTORY — PX: AV FISTULA PLACEMENT: SHX1204

## 2013-02-05 HISTORY — DX: Unspecified osteoarthritis, unspecified site: M19.90

## 2013-02-05 LAB — POCT I-STAT 4, (NA,K, GLUC, HGB,HCT)
Glucose, Bld: 96 mg/dL (ref 70–99)
HCT: 24 % — ABNORMAL LOW (ref 36.0–46.0)
Hemoglobin: 8.2 g/dL — ABNORMAL LOW (ref 12.0–15.0)
Potassium: 3.9 mEq/L (ref 3.7–5.3)
SODIUM: 140 meq/L (ref 137–147)

## 2013-02-05 LAB — HCG, SERUM, QUALITATIVE: PREG SERUM: NEGATIVE

## 2013-02-05 SURGERY — ARTERIOVENOUS (AV) FISTULA CREATION
Anesthesia: General | Site: Arm Upper | Laterality: Left

## 2013-02-05 MED ORDER — PROPOFOL 10 MG/ML IV BOLUS
INTRAVENOUS | Status: DC | PRN
  Administered 2013-02-05: 200 mg via INTRAVENOUS

## 2013-02-05 MED ORDER — STERILE WATER FOR INJECTION IJ SOLN
INTRAMUSCULAR | Status: AC
  Filled 2013-02-05: qty 10

## 2013-02-05 MED ORDER — SODIUM CHLORIDE 0.9 % IR SOLN
Status: DC | PRN
  Administered 2013-02-05: 08:00:00

## 2013-02-05 MED ORDER — ARTIFICIAL TEARS OP OINT
TOPICAL_OINTMENT | OPHTHALMIC | Status: AC
  Filled 2013-02-05: qty 3.5

## 2013-02-05 MED ORDER — SUCCINYLCHOLINE CHLORIDE 20 MG/ML IJ SOLN
INTRAMUSCULAR | Status: AC
  Filled 2013-02-05: qty 1

## 2013-02-05 MED ORDER — FENTANYL CITRATE 0.05 MG/ML IJ SOLN
INTRAMUSCULAR | Status: AC
  Filled 2013-02-05: qty 5

## 2013-02-05 MED ORDER — THROMBIN 20000 UNITS EX SOLR
CUTANEOUS | Status: AC
  Filled 2013-02-05: qty 20000

## 2013-02-05 MED ORDER — HYDROMORPHONE HCL PF 1 MG/ML IJ SOLN
0.2500 mg | INTRAMUSCULAR | Status: DC | PRN
  Administered 2013-02-05 (×3): 0.25 mg via INTRAVENOUS

## 2013-02-05 MED ORDER — MIDAZOLAM HCL 2 MG/2ML IJ SOLN
INTRAMUSCULAR | Status: AC
  Filled 2013-02-05: qty 2

## 2013-02-05 MED ORDER — 0.9 % SODIUM CHLORIDE (POUR BTL) OPTIME
TOPICAL | Status: DC | PRN
Start: 2013-02-05 — End: 2013-02-05
  Administered 2013-02-05: 1000 mL

## 2013-02-05 MED ORDER — OXYCODONE-ACETAMINOPHEN 5-325 MG PO TABS: 1.0000 | ORAL_TABLET | Freq: Four times a day (QID) | ORAL | Status: DC | PRN

## 2013-02-05 MED ORDER — HEPARIN SODIUM (PORCINE) 1000 UNIT/ML IJ SOLN
INTRAMUSCULAR | Status: AC
  Filled 2013-02-05: qty 1

## 2013-02-05 MED ORDER — GLYCOPYRROLATE 0.2 MG/ML IJ SOLN
INTRAMUSCULAR | Status: DC | PRN
  Administered 2013-02-05: 0.6 mg via INTRAVENOUS

## 2013-02-05 MED ORDER — ONDANSETRON HCL 4 MG/2ML IJ SOLN
INTRAMUSCULAR | Status: AC
  Filled 2013-02-05: qty 2

## 2013-02-05 MED ORDER — NEOSTIGMINE METHYLSULFATE 1 MG/ML IJ SOLN
INTRAMUSCULAR | Status: AC
  Filled 2013-02-05: qty 10

## 2013-02-05 MED ORDER — GLYCOPYRROLATE 0.2 MG/ML IJ SOLN
INTRAMUSCULAR | Status: AC
  Filled 2013-02-05: qty 3

## 2013-02-05 MED ORDER — ONDANSETRON HCL 4 MG/2ML IJ SOLN: 4.0000 mg | Freq: Once | INTRAMUSCULAR | Status: DC | PRN

## 2013-02-05 MED ORDER — LIDOCAINE HCL (CARDIAC) 20 MG/ML IV SOLN
INTRAVENOUS | Status: AC
  Filled 2013-02-05: qty 5

## 2013-02-05 MED ORDER — PROPOFOL 10 MG/ML IV BOLUS
INTRAVENOUS | Status: AC
  Filled 2013-02-05: qty 20

## 2013-02-05 MED ORDER — ROCURONIUM BROMIDE 100 MG/10ML IV SOLN
INTRAVENOUS | Status: DC | PRN
  Administered 2013-02-05: 30 mg via INTRAVENOUS

## 2013-02-05 MED ORDER — NEOSTIGMINE METHYLSULFATE 1 MG/ML IJ SOLN
INTRAMUSCULAR | Status: DC | PRN
  Administered 2013-02-05: 4 mg via INTRAVENOUS

## 2013-02-05 MED ORDER — SODIUM CHLORIDE 0.9 % IV SOLN: INTRAVENOUS | Status: DC

## 2013-02-05 MED ORDER — LIDOCAINE HCL (CARDIAC) 20 MG/ML IV SOLN
INTRAVENOUS | Status: DC | PRN
  Administered 2013-02-05: 100 mg via INTRAVENOUS

## 2013-02-05 MED ORDER — ROCURONIUM BROMIDE 50 MG/5ML IV SOLN
INTRAVENOUS | Status: AC
  Filled 2013-02-05: qty 1

## 2013-02-05 MED ORDER — MIDAZOLAM HCL 5 MG/5ML IJ SOLN
INTRAMUSCULAR | Status: DC | PRN
  Administered 2013-02-05 (×2): 1 mg via INTRAVENOUS

## 2013-02-05 MED ORDER — EPHEDRINE SULFATE 50 MG/ML IJ SOLN
INTRAMUSCULAR | Status: AC
  Filled 2013-02-05: qty 1

## 2013-02-05 MED ORDER — ONDANSETRON HCL 4 MG/2ML IJ SOLN
INTRAMUSCULAR | Status: DC | PRN
  Administered 2013-02-05: 4 mg via INTRAVENOUS

## 2013-02-05 MED ORDER — FENTANYL CITRATE 0.05 MG/ML IJ SOLN
INTRAMUSCULAR | Status: DC | PRN
  Administered 2013-02-05 (×3): 50 ug via INTRAVENOUS

## 2013-02-05 MED ORDER — SODIUM CHLORIDE 0.9 % IV SOLN
INTRAVENOUS | Status: DC | PRN
  Administered 2013-02-05 (×2): via INTRAVENOUS

## 2013-02-05 MED ORDER — HYDROMORPHONE HCL PF 1 MG/ML IJ SOLN
INTRAMUSCULAR | Status: AC
  Filled 2013-02-05: qty 1

## 2013-02-05 MED ORDER — EPHEDRINE SULFATE 50 MG/ML IJ SOLN
INTRAMUSCULAR | Status: DC | PRN
  Administered 2013-02-05 (×4): 5 mg via INTRAVENOUS

## 2013-02-05 SURGICAL SUPPLY — 41 items
ADH SKN CLS APL DERMABOND .7 (GAUZE/BANDAGES/DRESSINGS) ×1
ARMBAND PINK RESTRICT EXTREMIT (MISCELLANEOUS) ×2 IMPLANT
CANISTER SUCTION 2500CC (MISCELLANEOUS) ×2 IMPLANT
CLIP TI MEDIUM 6 (CLIP) ×2 IMPLANT
CLIP TI WIDE RED SMALL 6 (CLIP) ×2 IMPLANT
COVER PROBE W GEL 5X96 (DRAPES) ×1 IMPLANT
COVER SURGICAL LIGHT HANDLE (MISCELLANEOUS) ×2 IMPLANT
DECANTER SPIKE VIAL GLASS SM (MISCELLANEOUS) ×1 IMPLANT
DERMABOND ADVANCED (GAUZE/BANDAGES/DRESSINGS) ×1
DERMABOND ADVANCED .7 DNX12 (GAUZE/BANDAGES/DRESSINGS) ×1 IMPLANT
ELECT REM PT RETURN 9FT ADLT (ELECTROSURGICAL) ×2
ELECTRODE REM PT RTRN 9FT ADLT (ELECTROSURGICAL) ×1 IMPLANT
GLOVE BIO SURGEON STRL SZ 6.5 (GLOVE) ×1 IMPLANT
GLOVE BIO SURGEON STRL SZ7 (GLOVE) ×2 IMPLANT
GLOVE BIOGEL PI IND STRL 6.5 (GLOVE) IMPLANT
GLOVE BIOGEL PI IND STRL 7.0 (GLOVE) IMPLANT
GLOVE BIOGEL PI IND STRL 7.5 (GLOVE) ×1 IMPLANT
GLOVE BIOGEL PI INDICATOR 6.5 (GLOVE) ×2
GLOVE BIOGEL PI INDICATOR 7.0 (GLOVE) ×1
GLOVE BIOGEL PI INDICATOR 7.5 (GLOVE) ×1
GOWN STRL REUS W/ TWL LRG LVL3 (GOWN DISPOSABLE) ×3 IMPLANT
GOWN STRL REUS W/ TWL XL LVL3 (GOWN DISPOSABLE) IMPLANT
GOWN STRL REUS W/TWL LRG LVL3 (GOWN DISPOSABLE) ×4
GOWN STRL REUS W/TWL XL LVL3 (GOWN DISPOSABLE) ×4
KIT BASIN OR (CUSTOM PROCEDURE TRAY) ×2 IMPLANT
KIT ROOM TURNOVER OR (KITS) ×2 IMPLANT
NDL HYPO 25GX1X1/2 BEV (NEEDLE) ×1 IMPLANT
NEEDLE HYPO 25GX1X1/2 BEV (NEEDLE) IMPLANT
NS IRRIG 1000ML POUR BTL (IV SOLUTION) ×2 IMPLANT
PACK CV ACCESS (CUSTOM PROCEDURE TRAY) ×2 IMPLANT
PAD ARMBOARD 7.5X6 YLW CONV (MISCELLANEOUS) ×4 IMPLANT
SPONGE SURGIFOAM ABS GEL 100 (HEMOSTASIS) IMPLANT
SUT MNCRL AB 4-0 PS2 18 (SUTURE) ×2 IMPLANT
SUT PROLENE 6 0 BV (SUTURE) ×1 IMPLANT
SUT PROLENE 7 0 BV 1 (SUTURE) ×2 IMPLANT
SUT VIC AB 3-0 SH 27 (SUTURE) ×2
SUT VIC AB 3-0 SH 27X BRD (SUTURE) ×1 IMPLANT
TOWEL OR 17X24 6PK STRL BLUE (TOWEL DISPOSABLE) ×2 IMPLANT
TOWEL OR 17X26 10 PK STRL BLUE (TOWEL DISPOSABLE) ×2 IMPLANT
UNDERPAD 30X30 INCONTINENT (UNDERPADS AND DIAPERS) ×2 IMPLANT
WATER STERILE IRR 1000ML POUR (IV SOLUTION) ×2 IMPLANT

## 2013-02-05 NOTE — Op Note (Signed)
    OPERATIVE NOTE   PROCEDURE: left brachiocephalic arteriovenous fistula placement  PRE-OPERATIVE DIAGNOSIS: chronic kidney disease stage V   POST-OPERATIVE DIAGNOSIS: same as above   SURGEON: Adele Barthel, MD  ANESTHESIA: general  ESTIMATED BLOOD LOSS: 50 cc  FINDING(S): 1.  Faintly palpable thrill at end of case 2.  Faintly palpable radial pulse  SPECIMEN(S):  none  INDICATIONS:   [name] is a 38 y.o. female who presents with chronic kidney disease stage V .  The patient is scheduled for left brachiocephalic arteriovenous fistula placement.  The patient is aware the risks include but are not limited to: bleeding, infection, steal syndrome, nerve damage, ischemic monomelic neuropathy, failure to mature, and need for additional procedures.  The patient is aware of the risks of the procedure and elects to proceed forward.  DESCRIPTION: After full informed written consent was obtained from the patient, the patient was brought back to the operating room and placed supine upon the operating table.  Prior to induction, the patient received IV antibiotics.   After obtaining adequate anesthesia, the patient was then prepped and draped in the standard fashion for a left arm access procedure.  I turned my attention first to identifying the patient's cephalic vein and brachial artery.  Using SonoSite guidance, the location of these vessels were marked out on the skin.   I made a transverse incision at the level of the antecubitum and dissected through the subcutaneous tissue and fascia to gain exposure of the brachial artery.  This was noted to be 3 mm in diameter externally.  This was dissected out proximally and distally and controlled with vessel loops .  I then dissected out the cephalic vein.  This was noted to be 4-5 mm in diameter externally.  The distal segment of the vein was ligated with a  2-0 silk, and the vein was transected.  The proximal segment was iinterrogated with  serial dilators.  The vein accepted up to a 4 mm dilator without any difficulty.  I then instilled the heparinized saline into the vein and clamped it.  At this point, I reset my exposure of the brachial artery and placed the artery under tension proximally and distally.  I made an arteriotomy with a #11 blade, and then I extended the arteriotomy with a Potts scissor.  I injected heparinized saline proximal and distal to this arteriotomy.  The vein was then sewn to the artery in an end-to-side configuration with a running stitch of 7-0 Prolene.  Prior to completing this anastomosis, I allowed the vein and artery to backbleed.  There was no evidence of clot from any vessels.  I completed the anastomosis in the usual fashion and then released all vessel loops and clamps.  There was a faintly palpable  thrill in the venous outflow, and there was faintly palpable radial pulse.  At this point, I irrigated out the surgical wound.  There was no further active bleeding.  The subcutaneous tissue was reapproximated with a running stitch of 3-0 Vicryl.  The skin was then reapproximated with a running subcuticular stitch of 4-0 Vicryl.  The skin was then cleaned, dried, and reinforced with Dermabond.  The patient tolerated this procedure well.   COMPLICATIONS: none  CONDITION: stable  Adele Barthel, MD [facility] Office: [phone] Pager: [phone]  02/05/2013, 8:57 AM

## 2013-02-05 NOTE — Transfer of Care (Signed)
Immediate Anesthesia Transfer of Care Note  Patient: [name]  Procedure(s) Performed: Procedure(s): ARTERIOVENOUS FISTULA CREATION LEFT ARM (Left)  Patient Location: PACU  Anesthesia Type:General  Level of Consciousness: awake, alert  and oriented  Airway & Oxygen Therapy: Patient Spontanous Breathing and Patient connected to nasal cannula oxygen  Post-op Assessment: Report given to PACU RN and Post -op Vital signs reviewed and stable  Post vital signs: Reviewed and stable  Complications: No apparent anesthesia complications

## 2013-02-05 NOTE — Anesthesia Preprocedure Evaluation (Addendum)
Anesthesia Evaluation  Patient identified by MRN, date of birth, ID band Patient awake    Airway       Dental   Pulmonary          Cardiovascular hypertension,     Neuro/Psych Anxiety    GI/Hepatic   Endo/Other    Renal/GU ESRF, Dialysis and CRFRenal disease     Musculoskeletal   Abdominal   Peds  Hematology  (+) anemia ,   Anesthesia Other Findings   Reproductive/Obstetrics                          Anesthesia Physical Anesthesia Plan  ASA: III  Anesthesia Plan: General   Post-op Pain Management:    Induction: Intravenous  Airway Management Planned: Oral ETT and LMA  Additional Equipment:   Intra-op Plan:   Post-operative Plan: Extubation in OR  Informed Consent: I have reviewed the patients History and Physical, chart, labs and discussed the procedure including the risks, benefits and alternatives for the proposed anesthesia with the patient or authorized representative who has indicated his/her understanding and acceptance.     Plan Discussed with:   Anesthesia Plan Comments:         Anesthesia Quick Evaluation

## 2013-02-05 NOTE — Telephone Encounter (Addendum)
Message copied by Gena Fray on Mon Feb 05, 2013  2:57 PM ------      Message from: Denman George      Created: Mon Feb 05, 2013  9:34 AM                   ----- Message -----         From: Conrad Macungie, MD         Sent: 02/05/2013   8:59 AM           To: Patrici Ranks, Vvs Charge [address]            LATRISHA TONKINSON      [id number]      1975-10-05            PROCEDURE:      left brachiocephalic arteriovenous fistula placement            Follow-up: 6 weeks ------  02/05/13: lm for pt on home # regarding appt on 03/23/13 @ 1130 for lab and 12:30 for BLC. (her cell # is disconnected at this time) dpm

## 2013-02-05 NOTE — H&P (View-Only) (Signed)
Patient name: [name] MRN: [id number] DOB: Oct 31, 1975 Sex: female   Referred by: D[place]  Reason for referral:  Chief Complaint  Patient presents with  . Fall  . Dizziness  . Loss of Consciousness    HISTORY OF PRESENT ILLNESS: Patient is a 38 year old female with a history of chronic renal insufficiency. She was admitted with profound anemia felt to be secondary to menorrhagia. He did have a right brachiocephalic fistula by Dr. Oneida Alar on 08/03/2012. She was seen in our office in October of 2014 at that time had good size maturation of her vein but this was extremely deep due to her morbid obesity. She had been scheduled for superficial mobilization of this by Dr. Oneida Alar in November and the surgery was canceled. She is not an incisional disease but is progressing this with worsening renal failure. We were consulted consult to discuss mobilization.  Past Medical History  Diagnosis Date  . Hypertension   . Thyroid disease   . Morbid obesity   . Hyperlipidemia   . History of pneumonia   . Foot fracture   . Dilated cardiomyopathy   . Chronic kidney disease   . Anxiety   . Pneumonia     hosp. 2010  . CHF (congestive heart failure)     hosp. 2011  . Gout     Past Surgical History  Procedure Laterality Date  . Cardiac surgery  1979    repair of hole in heart  . Av fistula placement  08/04/2011    Procedure: ARTERIOVENOUS (AV) FISTULA CREATION;  Surgeon: Elam Dutch, MD;  Location: [facility] OR;  Service: Vascular;  Laterality: Right;  Creation right brachiocephalic arteriovenous fistula    History   Social History  . Marital Status: Single    Spouse Name: N/A    Number of Children: N/A  . Years of Education: N/A   Occupational History  . Not on file.   Social History Main Topics  . Smoking status: Never Smoker   . Smokeless tobacco: Never Used  . Alcohol Use: Yes     Comment: rare use  . Drug Use: No  . Sexual Activity: Yes    Birth Control/  Protection: None     Comment: no birth control    Other Topics Concern  . Not on file   Social History Narrative  . No narrative on file    Family History  Problem Relation Age of Onset  . Diabetes Mother   . Hyperlipidemia Mother   . Hypertension Mother   . Kidney disease Mother   . Anesthesia problems Neg Hx   . Hypotension Neg Hx   . Malignant hyperthermia Neg Hx   . Pseudochol deficiency Neg Hx     Allergies as of 01/27/2013  . (No Known Allergies)    No current facility-administered medications on file prior to encounter.   Current Outpatient Prescriptions on File Prior to Encounter  Medication Sig Dispense Refill  . allopurinol (ZYLOPRIM) 100 MG tablet Take 100 mg by mouth daily.       Marland Kitchen aspirin EC 81 MG tablet Take 81 mg by mouth daily.       . Calcium Carb-Cholecalciferol (CALCIUM 500 +D PO) Take 3 tablets by mouth daily.      . carvedilol (COREG) 25 MG tablet Take 25 mg by mouth 2 (two) times daily with a meal.      . furosemide (LASIX) 80 MG tablet Take 80 mg by mouth 2 (  two) times daily.      . hydrALAZINE (APRESOLINE) 25 MG tablet Take 100 mg by mouth 2 (two) times daily.      . pravastatin (PRAVACHOL) 40 MG tablet Take 40 mg by mouth daily.          REVIEW OF SYSTEMS: Reviewed in her history and physical with nothing to add  PHYSICAL EXAMINATION:  General: The patient is a well-nourished female, in no acute distress. Morbid obesity Vital signs are BP 146/77  Pulse 97  Temp(Src) 97.7 F (36.5 C) (Oral)  Resp 15  Wt 331 lb 9.2 oz (150.4 kg)  SpO2 100%  LMP 01/27/2013 Pulmonary: There is a good air exchange    Musculoskeletal: There are no major deformities.  There is no significant extremity pain. Neurologic: No focal weakness or paresthesias are detected, Skin: There are no ulcer or rashes noted. Psychiatric: The patient has normal affect. Cardiovascular: 2+ radial pulses bilaterally She does have a thrill at the antecubital space in her right  arm. Due to her obesity I do not have any way of knowing the status of her vein.    Impression and Plan:  Progressive renal insufficiency. Does have a patent fistula but inadequate due to the depth. Will obtain a venogram in the morning to determine patency and size of the fistula. If this is of good caliber will take to the operating room for mobilization. Discussed with the patient.    Saraiya Kozma [facility] Office: [phone]

## 2013-02-05 NOTE — Interval H&P Note (Signed)
[facility]  History and Physical Update  The patient was interviewed and re-examined.  The patient's previous History and Physical has been reviewed and is unchanged from Dr. Luther Parody consult except for: interval failed R arm access.  The plan is attempted L [facility] AVF today.  Adele Barthel, MD [facility] [place] Office: [phone] Pager: [phone]  02/05/2013, 7:22 AM

## 2013-02-05 NOTE — Anesthesia Postprocedure Evaluation (Signed)
  Anesthesia Post-op Note  Patient: [name]  Procedure(s) Performed: Procedure(s): ARTERIOVENOUS FISTULA CREATION LEFT ARM (Left)  Patient Location: PACU  Anesthesia Type:General  Level of Consciousness: awake, oriented, sedated and patient cooperative  Airway and Oxygen Therapy: Patient Spontanous Breathing  Post-op Pain: mild  Post-op Assessment: Post-op Vital signs reviewed, Patient's Cardiovascular Status Stable, Respiratory Function Stable, Patent Airway, No signs of Nausea or vomiting and Pain level controlled  Post-op Vital Signs: stable  Complications: No apparent anesthesia complications

## 2013-02-05 NOTE — Anesthesia Procedure Notes (Signed)
Procedure Name: Intubation Date/Time: 02/05/2013 7:37 AM Performed by: Maryland Pink Pre-anesthesia Checklist: Patient identified, Timeout performed, Emergency Drugs available, Suction available and Patient being monitored Patient Re-evaluated:Patient Re-evaluated prior to inductionOxygen Delivery Method: Circle system utilized Preoxygenation: Pre-oxygenation with 100% oxygen Intubation Type: IV induction Ventilation: Mask ventilation without difficulty and Oral airway inserted - appropriate to patient size Laryngoscope Size: Mac and 3 Grade View: Grade I Tube type: Oral Tube size: 7.0 mm Number of attempts: 1 Airway Equipment and Method: Stylet Placement Confirmation: ETT inserted through vocal cords under direct vision,  positive ETCO2 and breath sounds checked- equal and bilateral Secured at: 21 cm Tube secured with: Tape Dental Injury: Teeth and Oropharynx as per pre-operative assessment

## 2013-02-05 NOTE — Preoperative (Signed)
Beta Blockers   Reason not to administer Beta Blockers:Not Applicable, coreg this am

## 2013-02-06 ENCOUNTER — Encounter (HOSPITAL_COMMUNITY): Payer: Self-pay | Admitting: Vascular Surgery

## 2013-02-19 ENCOUNTER — Telehealth: Payer: Self-pay | Admitting: Vascular Surgery

## 2013-02-19 NOTE — Telephone Encounter (Addendum)
Message copied by Gena Fray on Mon Feb 19, 2013  2:29 PM ------      Message from: Denman George      Created: Mon Feb 19, 2013  1:55 PM      Regarding: NEEDS APPT W/ BLC      Contact: [phone]       Doristine Bosworth from [facility] called; pt. had a new left arm AVF placed 02/05/13 by Dr. Bridgett Larsson; no bruit or thrill/ needs appt. to eval. for new access per request Dr. Lorrene Reid.  Can we work her in 2/20, otherwise it will be 2 more weeks? (no vasc. Study as had vein map on 1/26) ------  02/19/13: lm for Shaquina with 02/23/13 appointment info. Unable to reach pt, phone # disconnected, dpm

## 2013-02-21 ENCOUNTER — Other Ambulatory Visit: Payer: Self-pay | Admitting: *Deleted

## 2013-02-21 DIAGNOSIS — N186 End stage renal disease: Secondary | ICD-10-CM

## 2013-02-21 DIAGNOSIS — Z01810 Encounter for preprocedural cardiovascular examination: Secondary | ICD-10-CM

## 2013-02-22 ENCOUNTER — Encounter: Payer: Self-pay | Admitting: Vascular Surgery

## 2013-02-23 ENCOUNTER — Ambulatory Visit (HOSPITAL_COMMUNITY)
Admission: RE | Admit: 2013-02-23 | Discharge: 2013-02-23 | Disposition: A | Discharge status: Home / Self Care | Payer: BC Managed Care – PPO | Source: Ambulatory Visit | Attending: Vascular Surgery | Admitting: Vascular Surgery

## 2013-02-23 ENCOUNTER — Ambulatory Visit (INDEPENDENT_AMBULATORY_CARE_PROVIDER_SITE_OTHER): Payer: BC Managed Care – PPO | Admitting: Vascular Surgery

## 2013-02-23 ENCOUNTER — Encounter: Payer: Self-pay | Admitting: Vascular Surgery

## 2013-02-23 ENCOUNTER — Ambulatory Visit (INDEPENDENT_AMBULATORY_CARE_PROVIDER_SITE_OTHER)
Admission: RE | Admit: 2013-02-23 | Discharge: 2013-02-23 | Disposition: A | Discharge status: Home / Self Care | Payer: BC Managed Care – PPO | Source: Ambulatory Visit | Attending: Vascular Surgery | Admitting: Vascular Surgery

## 2013-02-23 VITALS — BP 171/93 | HR 69 | Ht 69.0 in | Wt 341.0 lb

## 2013-02-23 DIAGNOSIS — Z01818 Encounter for other preprocedural examination: Secondary | ICD-10-CM | POA: Insufficient documentation

## 2013-02-23 DIAGNOSIS — Z01810 Encounter for preprocedural cardiovascular examination: Secondary | ICD-10-CM

## 2013-02-23 DIAGNOSIS — N186 End stage renal disease: Secondary | ICD-10-CM

## 2013-02-23 DIAGNOSIS — N185 Chronic kidney disease, stage 5: Secondary | ICD-10-CM

## 2013-02-23 NOTE — Progress Notes (Signed)
Established Dialysis Access  History of Present Illness  [name] is a 38 y.o. (25-Feb-1975) female who presents for re-evaluation for permanent access.  Previous access procedures have been completed in the right  arm.  The patient's complication from previous access procedures include: thrombosed fistula.  The patient has had a a recent left brachiocephalic arteriovenous fistula placement on 02/05/2013 that no longer has a palpable thrill since the past week.    Past Medical History  Diagnosis Date  . Hypertension   . Thyroid disease   . Morbid obesity   . Hyperlipidemia   . History of pneumonia   . Foot fracture   . Dilated cardiomyopathy   . Pneumonia     hosp. 2010  . CHF (congestive heart failure)     hosp. 2011  . Gout   . Anxiety     pt not aware of this  . Chronic kidney disease     not on dialysis at this time  . Arthritis   . Anemia     Past Surgical History  Procedure Laterality Date  . Cardiac surgery  1979    repair of hole in heart  . Av fistula placement  08/04/2011    Procedure: ARTERIOVENOUS (AV) FISTULA CREATION;  Surgeon: Elam Dutch, MD;  Location: [facility] OR;  Service: Vascular;  Laterality: Right;  Creation right brachiocephalic arteriovenous fistula  . Av fistula placement Left 02/05/2013    Procedure: ARTERIOVENOUS FISTULA CREATION LEFT ARM;  Surgeon: Conrad [place], MD;  Location: [facility];  Service: Vascular;  Laterality: Left;    History   Social History  . Marital Status: Single    Spouse Name: N/A    Number of Children: N/A  . Years of Education: N/A   Occupational History  . Not on file.   Social History Main Topics  . Smoking status: Never Smoker   . Smokeless tobacco: Never Used  . Alcohol Use: Yes     Comment: rare use  . Drug Use: No  . Sexual Activity: Yes    Birth Control/ Protection: None     Comment: no birth control    Other Topics Concern  . Not on file   Social History Narrative  . No narrative on file     Family History  Problem Relation Age of Onset  . Diabetes Mother   . Hyperlipidemia Mother   . Hypertension Mother   . Kidney disease Mother   . Anesthesia problems Neg Hx   . Hypotension Neg Hx   . Malignant hyperthermia Neg Hx   . Pseudochol deficiency Neg Hx    Current Outpatient Prescriptions on File Prior to Visit  Medication Sig Dispense Refill  . allopurinol (ZYLOPRIM) 100 MG tablet Take 100 mg by mouth daily.       . calcitRIOL (ROCALTROL) 0.25 MCG capsule Take 0.25 mcg by mouth 3 (three) times a week. Monday Wednesday and Friday      . Calcium Carb-Cholecalciferol (CALCIUM 500 +D PO) Take 3 tablets by mouth daily.      . carvedilol (COREG) 25 MG tablet Take 25 mg by mouth 2 (two) times daily with a meal.      . ferrous sulfate 325 (65 FE) MG EC tablet Take 1 tablet (325 mg total) by mouth 2 (two) times daily.  60 tablet  0  . furosemide (LASIX) 80 MG tablet Take 80 mg by mouth 2 (two) times daily.      . hydrALAZINE (  APRESOLINE) 25 MG tablet Take 25 mg by mouth daily as needed (for blood pressure greater than 140).      . megestrol (MEGACE) 20 MG tablet Take 1 tablet (20 mg total) by mouth 2 (two) times daily.  60 tablet  0  . oxyCODONE-acetaminophen (ROXICET) 5-325 MG per tablet Take 1-2 tablets by mouth every 6 (six) hours as needed for moderate pain.  30 tablet  0  . pravastatin (PRAVACHOL) 40 MG tablet Take 40 mg by mouth daily as needed (for cholesterol).        No current facility-administered medications on file prior to visit.    No Known Allergies  REVIEW OF SYSTEMS:  (Positives checked otherwise negative)  CARDIOVASCULAR:  []  chest pain, []  chest pressure, []  palpitations, []  shortness of breath when laying flat, []  shortness of breath with exertion,  []  pain in feet when walking, []  pain in feet when laying flat, []  history of blood clot in veins (DVT), []  history of phlebitis, []  swelling in legs, []  varicose veins  PULMONARY:  []  productive cough, []   asthma, []  wheezing  NEUROLOGIC:  []  weakness in arms or legs, []  numbness in arms or legs, []  difficulty speaking or slurred speech, []  temporary loss of vision in one eye, []  dizziness  HEMATOLOGIC:  []  bleeding problems, []  problems with blood clotting too easily  MUSCULOSKEL:  []  joint pain, []  joint swelling  GASTROINTEST:  []  vomiting blood, []  blood in stool     GENITOURINARY:  []  burning with urination, []  blood in urine  PSYCHIATRIC:  []  history of major depression  INTEGUMENTARY:  []  rashes, []  ulcers  Physical Examination  Filed Vitals:   02/23/13 1414  BP: 171/93  Pulse: 69  Height: 5\' 9"  (1.753 m)  Weight: 341 lb (154.677 kg)  SpO2: 100%   Body mass index is 50.33 kg/(m^2).  General: A&O x 3, WD, obese  Pulmonary: CTA B  Cardiac: RRR Vascular: Vessel Right Left  Radial Not Palpable   Ulnar notPalpable   Brachial notPalpable     Musculoskeletal: M/S 5/5 throughoutExtremities without  ischemic changes Neurologic: Pain and light touch intact in extremitiesMotor exam as listed above  Non-Invasive Vascular Imaging   Brachial artery occlusion via left upper extremity duplex performed today.   Impression:  [name] is a 38 y.o. 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   Plan: Thrombectomy brachial artery with possible fistula thrombectomy 02/26/2013.   [facility] Office: [place], [place] PA-C  02/23/2013, 2:52 PM  Addendum  I have independently interviewed and examined the patient, and I agree with the physician assistant's findings.  The patient history of functional L BC AVF followed by recent loss of thrill.  On ultrasound, she appears to have a brachial arterial occlusion, which raises the concern with possible embolism to L arm as fistula thrombosis does not usually compromise arterial inflow.  Surprisingly, the patient has  minimal sx.  On arterial duplex, there is evidence of collateral blood flow into the radial and ulnar arteries.  Will perform a thrombectomy of the L brachial artery and try to salvage the fistula also.    Adele Barthel, MD [facility] [place] Office: [phone] Pager: [phone]  02/23/2013, 3:24 PM

## 2013-02-26 ENCOUNTER — Encounter (HOSPITAL_COMMUNITY): Payer: Self-pay | Admitting: Pharmacy Technician

## 2013-02-26 ENCOUNTER — Encounter (HOSPITAL_COMMUNITY): Payer: Self-pay | Admitting: *Deleted

## 2013-02-26 ENCOUNTER — Other Ambulatory Visit: Payer: Self-pay

## 2013-02-26 MED ORDER — CEFUROXIME SODIUM 1.5 G IJ SOLR
1.5000 g | INTRAMUSCULAR | Status: AC
  Administered 2013-03-05: 1.5 g via INTRAVENOUS
  Filled 2013-02-26: qty 1.5

## 2013-02-26 MED ORDER — SODIUM CHLORIDE 0.9 % IV SOLN
INTRAVENOUS | Status: DC
  Administered 2013-03-05 – 2016-01-30 (×6): via INTRAVENOUS

## 2013-02-26 NOTE — Progress Notes (Signed)
02/26/13 1116  OBSTRUCTIVE SLEEP APNEA  Have you ever been diagnosed with sleep apnea through a sleep study? No  Do you snore loudly (loud enough to be heard through closed doors)?  1  Do you often feel tired, fatigued, or sleepy during the daytime? 1  Has anyone observed you stop breathing during your sleep? 0  Do you have, or are you being treated for high blood pressure? 1  BMI more than 35 kg/m2? 1  Age over 39 years old? 0  Gender: 0  Obstructive Sleep Apnea Score 4  Score 4 or greater  Results sent to PCP

## 2013-02-26 NOTE — Progress Notes (Signed)
Patient does not have a PCP, see Dr Lorrene Reid and has an appointment at [facility] clinic.

## 2013-02-27 ENCOUNTER — Ambulatory Visit (HOSPITAL_COMMUNITY)
Admission: RE | Admit: 2013-02-27 | Payer: BC Managed Care – PPO | Source: Ambulatory Visit | Admitting: Vascular Surgery

## 2013-02-27 DIAGNOSIS — E039 Hypothyroidism, unspecified: Secondary | ICD-10-CM | POA: Diagnosis not present

## 2013-02-27 DIAGNOSIS — D649 Anemia, unspecified: Secondary | ICD-10-CM | POA: Diagnosis not present

## 2013-02-27 DIAGNOSIS — I509 Heart failure, unspecified: Secondary | ICD-10-CM | POA: Diagnosis not present

## 2013-02-27 DIAGNOSIS — N186 End stage renal disease: Secondary | ICD-10-CM | POA: Diagnosis not present

## 2013-02-27 DIAGNOSIS — E785 Hyperlipidemia, unspecified: Secondary | ICD-10-CM | POA: Diagnosis not present

## 2013-02-27 DIAGNOSIS — F411 Generalized anxiety disorder: Secondary | ICD-10-CM | POA: Diagnosis not present

## 2013-02-27 DIAGNOSIS — M129 Arthropathy, unspecified: Secondary | ICD-10-CM | POA: Diagnosis not present

## 2013-02-27 DIAGNOSIS — Z6841 Body Mass Index (BMI) 40.0 and over, adult: Secondary | ICD-10-CM | POA: Diagnosis not present

## 2013-02-27 DIAGNOSIS — M109 Gout, unspecified: Secondary | ICD-10-CM | POA: Diagnosis not present

## 2013-02-27 DIAGNOSIS — I428 Other cardiomyopathies: Secondary | ICD-10-CM | POA: Diagnosis not present

## 2013-02-27 DIAGNOSIS — I998 Other disorder of circulatory system: Secondary | ICD-10-CM | POA: Diagnosis not present

## 2013-02-27 DIAGNOSIS — Y832 Surgical operation with anastomosis, bypass or graft as the cause of abnormal reaction of the patient, or of later complication, without mention of misadventure at the time of the procedure: Secondary | ICD-10-CM | POA: Diagnosis not present

## 2013-02-27 DIAGNOSIS — Z0389 Encounter for observation for other suspected diseases and conditions ruled out: Secondary | ICD-10-CM | POA: Diagnosis present

## 2013-02-27 DIAGNOSIS — I742 Embolism and thrombosis of arteries of the upper extremities: Secondary | ICD-10-CM | POA: Diagnosis not present

## 2013-02-27 DIAGNOSIS — T82898A Other specified complication of vascular prosthetic devices, implants and grafts, initial encounter: Secondary | ICD-10-CM | POA: Diagnosis not present

## 2013-02-27 DIAGNOSIS — I120 Hypertensive chronic kidney disease with stage 5 chronic kidney disease or end stage renal disease: Secondary | ICD-10-CM | POA: Diagnosis not present

## 2013-02-27 HISTORY — DX: Excessive and frequent menstruation with regular cycle: N92.0

## 2013-02-27 HISTORY — DX: Encounter for other specified aftercare: Z51.89

## 2013-02-27 SURGERY — THROMBECTOMY ARTERIOVENOUS FISTULA
Anesthesia: Monitor Anesthesia Care | Site: Arm Upper | Laterality: Left

## 2013-02-28 ENCOUNTER — Encounter (HOSPITAL_COMMUNITY): Payer: Self-pay | Admitting: Pharmacy Technician

## 2013-03-02 ENCOUNTER — Encounter (HOSPITAL_COMMUNITY): Payer: Self-pay | Admitting: *Deleted

## 2013-03-02 NOTE — Progress Notes (Signed)
Pt updated with new surgery date and arrival time; pt made aware to arrive on Monday 03/05/13 at 6:30 AM ( per MD) to the [facility] SS unit.

## 2013-03-05 ENCOUNTER — Encounter (HOSPITAL_COMMUNITY)
Admission: RE | Disposition: A | Discharge status: Home / Self Care | Payer: Self-pay | Source: Ambulatory Visit | Attending: Vascular Surgery

## 2013-03-05 ENCOUNTER — Ambulatory Visit (HOSPITAL_COMMUNITY): Payer: BC Managed Care – PPO | Admitting: Certified Registered Nurse Anesthetist

## 2013-03-05 ENCOUNTER — Encounter (HOSPITAL_COMMUNITY): Payer: Self-pay | Admitting: *Deleted

## 2013-03-05 ENCOUNTER — Encounter (HOSPITAL_COMMUNITY): Payer: BC Managed Care – PPO | Admitting: Certified Registered Nurse Anesthetist

## 2013-03-05 ENCOUNTER — Ambulatory Visit (HOSPITAL_COMMUNITY)
Admission: RE | Admit: 2013-03-05 | Discharge: 2013-03-05 | Disposition: A | Discharge status: Home / Self Care | Payer: BC Managed Care – PPO | Source: Ambulatory Visit | Attending: Vascular Surgery | Admitting: Vascular Surgery

## 2013-03-05 DIAGNOSIS — N186 End stage renal disease: Secondary | ICD-10-CM | POA: Insufficient documentation

## 2013-03-05 DIAGNOSIS — T82898A Other specified complication of vascular prosthetic devices, implants and grafts, initial encounter: Secondary | ICD-10-CM | POA: Insufficient documentation

## 2013-03-05 DIAGNOSIS — M109 Gout, unspecified: Secondary | ICD-10-CM | POA: Insufficient documentation

## 2013-03-05 DIAGNOSIS — F411 Generalized anxiety disorder: Secondary | ICD-10-CM | POA: Insufficient documentation

## 2013-03-05 DIAGNOSIS — E039 Hypothyroidism, unspecified: Secondary | ICD-10-CM | POA: Insufficient documentation

## 2013-03-05 DIAGNOSIS — Y832 Surgical operation with anastomosis, bypass or graft as the cause of abnormal reaction of the patient, or of later complication, without mention of misadventure at the time of the procedure: Secondary | ICD-10-CM | POA: Insufficient documentation

## 2013-03-05 DIAGNOSIS — Z6841 Body Mass Index (BMI) 40.0 and over, adult: Secondary | ICD-10-CM | POA: Insufficient documentation

## 2013-03-05 DIAGNOSIS — I428 Other cardiomyopathies: Secondary | ICD-10-CM | POA: Insufficient documentation

## 2013-03-05 DIAGNOSIS — I998 Other disorder of circulatory system: Secondary | ICD-10-CM | POA: Insufficient documentation

## 2013-03-05 DIAGNOSIS — I509 Heart failure, unspecified: Secondary | ICD-10-CM | POA: Insufficient documentation

## 2013-03-05 DIAGNOSIS — I742 Embolism and thrombosis of arteries of the upper extremities: Secondary | ICD-10-CM | POA: Insufficient documentation

## 2013-03-05 DIAGNOSIS — I120 Hypertensive chronic kidney disease with stage 5 chronic kidney disease or end stage renal disease: Secondary | ICD-10-CM | POA: Insufficient documentation

## 2013-03-05 DIAGNOSIS — E785 Hyperlipidemia, unspecified: Secondary | ICD-10-CM | POA: Insufficient documentation

## 2013-03-05 DIAGNOSIS — M129 Arthropathy, unspecified: Secondary | ICD-10-CM | POA: Insufficient documentation

## 2013-03-05 DIAGNOSIS — D649 Anemia, unspecified: Secondary | ICD-10-CM | POA: Insufficient documentation

## 2013-03-05 HISTORY — PX: PATCH ANGIOPLASTY: SHX6230

## 2013-03-05 HISTORY — PX: LIGATION OF ARTERIOVENOUS  FISTULA: SHX5948

## 2013-03-05 HISTORY — PX: THROMBECTOMY W/ EMBOLECTOMY: SHX2507

## 2013-03-05 LAB — POCT I-STAT 4, (NA,K, GLUC, HGB,HCT)
Glucose, Bld: 91 mg/dL (ref 70–99)
HEMATOCRIT: 33 % — AB (ref 36.0–46.0)
Hemoglobin: 11.2 g/dL — ABNORMAL LOW (ref 12.0–15.0)
POTASSIUM: 4.1 meq/L (ref 3.7–5.3)
SODIUM: 142 meq/L (ref 137–147)

## 2013-03-05 LAB — HCG, SERUM, QUALITATIVE: PREG SERUM: NEGATIVE

## 2013-03-05 SURGERY — THROMBECTOMY ARTERIOVENOUS FISTULA
Anesthesia: General | Site: Arm Upper | Laterality: Left

## 2013-03-05 MED ORDER — ONDANSETRON HCL 4 MG/2ML IJ SOLN
INTRAMUSCULAR | Status: AC
  Filled 2013-03-05: qty 2

## 2013-03-05 MED ORDER — PROTAMINE SULFATE 10 MG/ML IV SOLN
INTRAVENOUS | Status: AC
  Filled 2013-03-05: qty 5

## 2013-03-05 MED ORDER — OXYCODONE HCL 5 MG PO TABS: 5.0000 mg | ORAL_TABLET | Freq: Once | ORAL | Status: DC | PRN

## 2013-03-05 MED ORDER — BUPIVACAINE HCL (PF) 0.25 % IJ SOLN
INTRAMUSCULAR | Status: AC
  Filled 2013-03-05: qty 30

## 2013-03-05 MED ORDER — PROMETHAZINE HCL 25 MG/ML IJ SOLN: 6.2500 mg | INTRAMUSCULAR | Status: DC | PRN

## 2013-03-05 MED ORDER — LIDOCAINE HCL (CARDIAC) 20 MG/ML IV SOLN
INTRAVENOUS | Status: AC
  Filled 2013-03-05: qty 5

## 2013-03-05 MED ORDER — ONDANSETRON HCL 4 MG/2ML IJ SOLN
INTRAMUSCULAR | Status: DC | PRN
  Administered 2013-03-05: 4 mg via INTRAVENOUS

## 2013-03-05 MED ORDER — ARTIFICIAL TEARS OP OINT
TOPICAL_OINTMENT | OPHTHALMIC | Status: DC | PRN
  Administered 2013-03-05: 1 via OPHTHALMIC

## 2013-03-05 MED ORDER — CHLORHEXIDINE GLUCONATE CLOTH 2 % EX PADS: 6.0000 | MEDICATED_PAD | Freq: Once | CUTANEOUS | Status: DC

## 2013-03-05 MED ORDER — LIDOCAINE HCL (CARDIAC) 20 MG/ML IV SOLN
INTRAVENOUS | Status: DC | PRN
  Administered 2013-03-05: 100 mg via INTRAVENOUS

## 2013-03-05 MED ORDER — SODIUM CHLORIDE 0.9 % IR SOLN
Status: DC | PRN
  Administered 2013-03-05: 10:00:00

## 2013-03-05 MED ORDER — FENTANYL CITRATE 0.05 MG/ML IJ SOLN
INTRAMUSCULAR | Status: AC
  Filled 2013-03-05: qty 5

## 2013-03-05 MED ORDER — PROPOFOL 10 MG/ML IV BOLUS
INTRAVENOUS | Status: DC | PRN
  Administered 2013-03-05: 150 mg via INTRAVENOUS

## 2013-03-05 MED ORDER — 0.9 % SODIUM CHLORIDE (POUR BTL) OPTIME
TOPICAL | Status: DC | PRN
  Administered 2013-03-05: 1000 mL

## 2013-03-05 MED ORDER — OXYCODONE HCL 5 MG PO TABS: 5.0000 mg | ORAL_TABLET | ORAL | Status: DC | PRN

## 2013-03-05 MED ORDER — SUCCINYLCHOLINE CHLORIDE 20 MG/ML IJ SOLN
INTRAMUSCULAR | Status: DC | PRN
  Administered 2013-03-05: 120 mg via INTRAVENOUS

## 2013-03-05 MED ORDER — HEPARIN SODIUM (PORCINE) 1000 UNIT/ML IJ SOLN
INTRAMUSCULAR | Status: AC
  Filled 2013-03-05: qty 1

## 2013-03-05 MED ORDER — SODIUM CHLORIDE 0.9 % IV SOLN
INTRAVENOUS | Status: DC
  Administered 2013-03-05: 10 mL/h via INTRAVENOUS

## 2013-03-05 MED ORDER — SURGIFOAM 100 EX MISC
CUTANEOUS | Status: DC | PRN
  Administered 2013-03-05: 10:00:00 via TOPICAL

## 2013-03-05 MED ORDER — ARTIFICIAL TEARS OP OINT
TOPICAL_OINTMENT | OPHTHALMIC | Status: AC
  Filled 2013-03-05: qty 3.5

## 2013-03-05 MED ORDER — THROMBIN 20000 UNITS EX SOLR
CUTANEOUS | Status: AC
  Filled 2013-03-05: qty 20000

## 2013-03-05 MED ORDER — FENTANYL CITRATE 0.05 MG/ML IJ SOLN
INTRAMUSCULAR | Status: DC | PRN
  Administered 2013-03-05: 150 ug via INTRAVENOUS
  Administered 2013-03-05 (×2): 50 ug via INTRAVENOUS

## 2013-03-05 MED ORDER — HEPARIN SODIUM (PORCINE) 1000 UNIT/ML IJ SOLN
INTRAMUSCULAR | Status: DC | PRN
  Administered 2013-03-05: 10000 [IU] via INTRAVENOUS

## 2013-03-05 MED ORDER — HYDROMORPHONE HCL PF 1 MG/ML IJ SOLN
0.2500 mg | INTRAMUSCULAR | Status: DC | PRN
  Administered 2013-03-05: 0.5 mg via INTRAVENOUS
  Administered 2013-03-05 (×2): 0.25 mg via INTRAVENOUS

## 2013-03-05 MED ORDER — PROPOFOL 10 MG/ML IV BOLUS
INTRAVENOUS | Status: AC
  Filled 2013-03-05: qty 20

## 2013-03-05 MED ORDER — HYDROMORPHONE HCL PF 1 MG/ML IJ SOLN
INTRAMUSCULAR | Status: AC
  Filled 2013-03-05: qty 1

## 2013-03-05 MED ORDER — OXYCODONE HCL 5 MG/5ML PO SOLN: 5.0000 mg | Freq: Once | ORAL | Status: DC | PRN

## 2013-03-05 MED ORDER — MIDAZOLAM HCL 2 MG/2ML IJ SOLN
INTRAMUSCULAR | Status: AC
  Filled 2013-03-05: qty 2

## 2013-03-05 MED ORDER — PROTAMINE SULFATE 10 MG/ML IV SOLN
INTRAVENOUS | Status: DC | PRN
  Administered 2013-03-05: 20 mg via INTRAVENOUS

## 2013-03-05 SURGICAL SUPPLY — 51 items
ADH SKN CLS APL DERMABOND .7 (GAUZE/BANDAGES/DRESSINGS) ×1
ARMBAND PINK RESTRICT EXTREMIT (MISCELLANEOUS) ×2 IMPLANT
BANDAGE ESMARK 6X9 LF (GAUZE/BANDAGES/DRESSINGS) IMPLANT
BNDG CMPR 9X6 STRL LF SNTH (GAUZE/BANDAGES/DRESSINGS) ×1
BNDG ESMARK 6X9 LF (GAUZE/BANDAGES/DRESSINGS) ×2
CANISTER SUCTION 2500CC (MISCELLANEOUS) ×2 IMPLANT
CATH EMB 3FR 80CM (CATHETERS) ×1 IMPLANT
CATH EMB 4FR 80CM (CATHETERS) ×2 IMPLANT
CLIP TI MEDIUM 6 (CLIP) ×2 IMPLANT
CLIP TI WIDE RED SMALL 6 (CLIP) ×2 IMPLANT
COVER SURGICAL LIGHT HANDLE (MISCELLANEOUS) ×2 IMPLANT
CUFF TOURNIQUET SINGLE 34IN LL (TOURNIQUET CUFF) ×1 IMPLANT
DERMABOND ADVANCED (GAUZE/BANDAGES/DRESSINGS) ×1
DERMABOND ADVANCED .7 DNX12 (GAUZE/BANDAGES/DRESSINGS) ×1 IMPLANT
DRAPE X-RAY CASS 24X20 (DRAPES) IMPLANT
ELECT REM PT RETURN 9FT ADLT (ELECTROSURGICAL) ×2
ELECTRODE REM PT RTRN 9FT ADLT (ELECTROSURGICAL) ×1 IMPLANT
GAUZE SPONGE 4X4 16PLY XRAY LF (GAUZE/BANDAGES/DRESSINGS) IMPLANT
GEL ULTRASOUND 20GR AQUASONIC (MISCELLANEOUS) ×1 IMPLANT
GLOVE BIO SURGEON STRL SZ7 (GLOVE) ×2 IMPLANT
GLOVE BIOGEL PI IND STRL 6.5 (GLOVE) IMPLANT
GLOVE BIOGEL PI IND STRL 7.0 (GLOVE) IMPLANT
GLOVE BIOGEL PI IND STRL 7.5 (GLOVE) ×1 IMPLANT
GLOVE BIOGEL PI INDICATOR 6.5 (GLOVE) ×2
GLOVE BIOGEL PI INDICATOR 7.0 (GLOVE) ×2
GLOVE BIOGEL PI INDICATOR 7.5 (GLOVE) ×1
GLOVE ECLIPSE 6.0 STRL STRAW (GLOVE) ×1 IMPLANT
GLOVE ECLIPSE 6.5 STRL STRAW (GLOVE) ×1 IMPLANT
GOWN STRL REUS W/ TWL LRG LVL3 (GOWN DISPOSABLE) ×3 IMPLANT
GOWN STRL REUS W/TWL LRG LVL3 (GOWN DISPOSABLE) ×6
INSERT FOGARTY SM (MISCELLANEOUS) ×2 IMPLANT
KIT BASIN OR (CUSTOM PROCEDURE TRAY) ×2 IMPLANT
KIT ROOM TURNOVER OR (KITS) ×2 IMPLANT
NS IRRIG 1000ML POUR BTL (IV SOLUTION) ×2 IMPLANT
PACK CV ACCESS (CUSTOM PROCEDURE TRAY) ×2 IMPLANT
PAD ARMBOARD 7.5X6 YLW CONV (MISCELLANEOUS) ×4 IMPLANT
PATCH VASCULAR VASCU GUARD 1X6 (Vascular Products) ×1 IMPLANT
SET COLLECT BLD 21X3/4 12 (NEEDLE) IMPLANT
SPONGE SURGIFOAM ABS GEL 100 (HEMOSTASIS) ×1 IMPLANT
STOPCOCK 4 WAY LG BORE MALE ST (IV SETS) ×1 IMPLANT
SUT MNCRL AB 4-0 PS2 18 (SUTURE) ×2 IMPLANT
SUT PROLENE 5 0 C 1 24 (SUTURE) ×1 IMPLANT
SUT PROLENE 6 0 BV (SUTURE) ×3 IMPLANT
SUT VIC AB 3-0 SH 27 (SUTURE) ×2
SUT VIC AB 3-0 SH 27X BRD (SUTURE) ×1 IMPLANT
TOWEL OR 17X24 6PK STRL BLUE (TOWEL DISPOSABLE) ×2 IMPLANT
TOWEL OR 17X26 10 PK STRL BLUE (TOWEL DISPOSABLE) ×2 IMPLANT
TUBE SUCTION CARDIAC 10FR (CANNULA) ×1 IMPLANT
TUBING EXTENTION W/L.L. (IV SETS) IMPLANT
UNDERPAD 30X30 INCONTINENT (UNDERPADS AND DIAPERS) ×2 IMPLANT
WATER STERILE IRR 1000ML POUR (IV SOLUTION) ×2 IMPLANT

## 2013-03-05 NOTE — H&P (View-Only) (Signed)
Established Dialysis Access  History of Present Illness  [name] is a 38 y.o. ([birth year]) female who presents for re-evaluation for permanent access.  Previous access procedures have been completed in the right  arm.  The patient's complication from previous access procedures include: thrombosed fistula.  The patient has had a a recent left brachiocephalic arteriovenous fistula placement on 02/05/2013 that no longer has a palpable thrill since the past week.    Past Medical History  Diagnosis Date  . Hypertension   . Thyroid disease   . Morbid obesity   . Hyperlipidemia   . History of pneumonia   . Foot fracture   . Dilated cardiomyopathy   . Pneumonia     hosp. 2010  . CHF (congestive heart failure)     hosp. 2011  . Gout   . Anxiety     pt not aware of this  . Chronic kidney disease     not on dialysis at this time  . Arthritis   . Anemia     Past Surgical History  Procedure Laterality Date  . Cardiac surgery  1979    repair of hole in heart  . Av fistula placement  08/04/2011    Procedure: ARTERIOVENOUS (AV) FISTULA CREATION;  Surgeon: Elam Dutch, MD;  Location: [facility] OR;  Service: Vascular;  Laterality: Right;  Creation right brachiocephalic arteriovenous fistula  . Av fistula placement Left 02/05/2013    Procedure: ARTERIOVENOUS FISTULA CREATION LEFT ARM;  Surgeon: Conrad [place], MD;  Location: [place];  Service: Vascular;  Laterality: Left;    History   Social History  . Marital Status: Single    Spouse Name: N/A    Number of Children: N/A  . Years of Education: N/A   Occupational History  . Not on file.   Social History Main Topics  . Smoking status: Never Smoker   . Smokeless tobacco: Never Used  . Alcohol Use: Yes     Comment: rare use  . Drug Use: No  . Sexual Activity: Yes    Birth Control/ Protection: None     Comment: no birth control    Other Topics Concern  . Not on file   Social History Narrative  . No narrative on file     Family History  Problem Relation Age of Onset  . Diabetes Mother   . Hyperlipidemia Mother   . Hypertension Mother   . Kidney disease Mother   . Anesthesia problems Neg Hx   . Hypotension Neg Hx   . Malignant hyperthermia Neg Hx   . Pseudochol deficiency Neg Hx    Current Outpatient Prescriptions on File Prior to Visit  Medication Sig Dispense Refill  . allopurinol (ZYLOPRIM) 100 MG tablet Take 100 mg by mouth daily.       . calcitRIOL (ROCALTROL) 0.25 MCG capsule Take 0.25 mcg by mouth 3 (three) times a week. Monday Wednesday and Friday      . Calcium Carb-Cholecalciferol (CALCIUM 500 +D PO) Take 3 tablets by mouth daily.      . carvedilol (COREG) 25 MG tablet Take 25 mg by mouth 2 (two) times daily with a meal.      . ferrous sulfate 325 (65 FE) MG EC tablet Take 1 tablet (325 mg total) by mouth 2 (two) times daily.  60 tablet  0  . furosemide (LASIX) 80 MG tablet Take 80 mg by mouth 2 (two) times daily.      . hydrALAZINE (  APRESOLINE) 25 MG tablet Take 25 mg by mouth daily as needed (for blood pressure greater than 140).      . megestrol (MEGACE) 20 MG tablet Take 1 tablet (20 mg total) by mouth 2 (two) times daily.  60 tablet  0  . oxyCODONE-acetaminophen (ROXICET) 5-325 MG per tablet Take 1-2 tablets by mouth every 6 (six) hours as needed for moderate pain.  30 tablet  0  . pravastatin (PRAVACHOL) 40 MG tablet Take 40 mg by mouth daily as needed (for cholesterol).        No current facility-administered medications on file prior to visit.    No Known Allergies  REVIEW OF SYSTEMS:  (Positives checked otherwise negative)  CARDIOVASCULAR:  []  chest pain, []  chest pressure, []  palpitations, []  shortness of breath when laying flat, []  shortness of breath with exertion,  []  pain in feet when walking, []  pain in feet when laying flat, []  history of blood clot in veins (DVT), []  history of phlebitis, []  swelling in legs, []  varicose veins  PULMONARY:  []  productive cough, []   asthma, []  wheezing  NEUROLOGIC:  []  weakness in arms or legs, []  numbness in arms or legs, []  difficulty speaking or slurred speech, []  temporary loss of vision in one eye, []  dizziness  HEMATOLOGIC:  []  bleeding problems, []  problems with blood clotting too easily  MUSCULOSKEL:  []  joint pain, []  joint swelling  GASTROINTEST:  []  vomiting blood, []  blood in stool     GENITOURINARY:  []  burning with urination, []  blood in urine  PSYCHIATRIC:  []  history of major depression  INTEGUMENTARY:  []  rashes, []  ulcers  Physical Examination  Filed Vitals:   02/23/13 1414  BP: 171/93  Pulse: 69  Height: 5\' 9"  (1.753 m)  Weight: 341 lb (154.677 kg)  SpO2: 100%   Body mass index is 50.33 kg/(m^2).  General: A&O x 3, WD, obese  Pulmonary: CTA B  Cardiac: RRR Vascular: Vessel Right Left  Radial Not Palpable   Ulnar notPalpable   Brachial notPalpable     Musculoskeletal: M/S 5/5 throughoutExtremities without  ischemic changes Neurologic: Pain and light touch intact in extremitiesMotor exam as listed above  Non-Invasive Vascular Imaging   Brachial artery occlusion via left upper extremity duplex performed today.   Impression:  [name] is a 38 y.o. 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   Plan: Thrombectomy brachial artery with possible fistula thrombectomy 02/26/2013.   [facility] Office: [place], Garber PA-C  02/23/2013, 2:52 PM  Addendum  I have independently interviewed and examined the patient, and I agree with the physician assistant's findings.  The patient history of functional L BC AVF followed by recent loss of thrill.  On ultrasound, she appears to have a brachial arterial occlusion, which raises the concern with possible embolism to L arm as fistula thrombosis does not usually compromise arterial inflow.  Surprisingly, the patient has  minimal sx.  On arterial duplex, there is evidence of collateral blood flow into the radial and ulnar arteries.  Will perform a thrombectomy of the L brachial artery and try to salvage the fistula also.    Adele Barthel, MD [facility] [place] Office: [phone] Pager: [phone]  02/23/2013, 3:24 PM

## 2013-03-05 NOTE — Preoperative (Signed)
Beta Blockers   Reason not to administer Beta Blockers:Not Applicable  Pt took am Coreg.

## 2013-03-05 NOTE — Interval H&P Note (Signed)
[facility] [place]  History and Physical Update  The patient was interviewed and re-examined.  The patient's previous History and Physical has been reviewed and is unchanged except for: pt failed to show last week due to weather issues.  There is no change in the plan of care: L brachial artery thrombectomy, attempted L BC AVF thrombectomy.  Adele Barthel, MD [facility] [place] Office: [phone] Pager: [phone]  03/05/2013, 8:00 AM

## 2013-03-05 NOTE — Anesthesia Postprocedure Evaluation (Signed)
Anesthesia Post Note  Patient: [name]  Procedure(s) Performed: Procedure(s) (LRB): THROMBECTOMY OF LEFT BRACHIAL ARTERY (Left) LIGATION OF ARTERIOVENOUS  FISTULA (Left) PATCH ANGIOPLASTY (Left)  Anesthesia type: general  Patient location: PACU  Post pain: Pain level controlled  Post assessment: Patient's Cardiovascular Status Stable  Last Vitals:  Filed Vitals:   03/05/13 1231  BP: 141/68  Pulse: 94  Temp: 36.3 C  Resp: 15    Post vital signs: Reviewed and stable  Level of consciousness: sedated  Complications: No apparent anesthesia complications

## 2013-03-05 NOTE — Anesthesia Preprocedure Evaluation (Addendum)
Anesthesia Evaluation  Patient identified by MRN, date of birth, ID band Patient awake    Reviewed: H&P , NPO status , Patient's Chart, lab work & pertinent test results  History of Anesthesia Complications Negative for: history of anesthetic complications  Airway Mallampati: III TM Distance: >3 FB Neck ROM: Full    Dental  (+) Teeth Intact, Dental Advisory Given   Pulmonary neg pulmonary ROS,    Pulmonary exam normal       Cardiovascular hypertension, Pt. on home beta blockers +CHF     Neuro/Psych PSYCHIATRIC DISORDERS Anxiety negative neurological ROS     GI/Hepatic negative GI ROS, Neg liver ROS,   Endo/Other  Hypothyroidism Morbid obesity  Renal/GU Renal InsufficiencyRenal disease     Musculoskeletal   Abdominal   Peds  Hematology   Anesthesia Other Findings   Reproductive/Obstetrics                         Anesthesia Physical Anesthesia Plan  ASA: III  Anesthesia Plan: General   Post-op Pain Management:    Induction: Intravenous  Airway Management Planned: Oral ETT  Additional Equipment:   Intra-op Plan:   Post-operative Plan: Extubation in OR  Informed Consent: I have reviewed the patients History and Physical, chart, labs and discussed the procedure including the risks, benefits and alternatives for the proposed anesthesia with the patient or authorized representative who has indicated his/her understanding and acceptance.   Dental advisory given  Plan Discussed with: CRNA, Anesthesiologist and Surgeon  Anesthesia Plan Comments:        Anesthesia Quick Evaluation

## 2013-03-05 NOTE — Op Note (Signed)
OPERATIVE NOTE   PROCEDURE: 1. Thrombectomy of left brachial artery 2. Bovine patch angioplasty of left brachial artery 3. Ligation of left brachiocephalic arteriovenous fistula    PRE-OPERATIVE DIAGNOSIS: Possible embolism to left brachial artery  POST-OPERATIVE DIAGNOSIS: same as above   SURGEON: Adele Barthel, MD  ANESTHESIA: general  ESTIMATED BLOOD LOSS: 100 cc  FINDING(S): 1.  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  Sclerotic thrombus in distal brachiocephalic arteriovenous fistula sith severe sclerosis in distal vein  SPECIMEN(S):  Brachial artery thrombus  INDICATIONS:   [name] is a 38 y.o. female who presents with a recently occluded left brachiocephalic arteriovenous fistula.  This recently placed fistula was function until ~1-2 weeks ago, when it acutely stopped functioning.  The duplex was consistent with thrombus in the brachial artery.  I felt this was concerning for possible thromboembolism.  I felt a thrombectomy of left brachial artery and attempted left brachiocephalic arteriovenous fistula was indicated.  Risk, benefits, and alternatives to access surgery were discussed.  The patient is aware the risks include but are not limited to: bleeding, infection, steal syndrome, nerve damage, ischemic monomelic neuropathy, failure to mature, need for additional procedures, death and stroke.  The patient agrees to proceed forward with the procedure.  DESCRIPTION: After obtaining full informed written consent, the patient was brought back to the operating room and placed supine upon the operating table.  The patient received IV antibiotics prior to induction.  After obtaining adequate anesthesia, the patient was prepped and draped in the standard fashion for: left arm access procedure.  I made an incision over the the previous incision.  I dissected out the subcutaneous tissue with electrocautery.  There was evidence of healing  in the subcutaneous tissue.  I was able to dissect out the fistula and traced the fistula back to the brachial artery.  There was extensive scarring around the brachial artery.  The brachial veins surrounded the anastomosis so I felt fully dissecting out the brachial artery for control was not going to be possible.  I gave the patient 10000 units of Heparin intravenously.  I placed a sterile tourniquet on the upper arm,  After waiting 3 minutes, I exsanguinated the left arm with an Esmark bandage and inflated the tourniquet to 300 mm Hg.  I transected the distal fistula and then opened the residual fistula longitudinally.  There was extensive organized thrombotic material and extensive scarring in distal fistula.  The time course is not consistent with the findings, suggesting this is embolic material from an unknown origin.  I sharply took off the residual vein off the brachial artery.  Again, chronic organized thrombus was present in the brachial artery.  I removed this portion of thrombus and then passed a 3 Fogarty distally, extracting an organized thrombus from an forearm artery.  After removing this thrombus, I had return of blood.  I then dropped the tourniquet and passed a 3 and 4 Fogarty balloon proximally, obtaining a plug of organized clot ~20 cm proximal to the anastomosis, followed by acute thrombus.  All of these findings are consistent with embolic phenomena.  I reinflated the tourniquet and washed out the artery.  At this point, I fashioned a previously soaking bovine pericardial patch for the geometry of the arteriotomy.  I sewed a patch onto the brachial artery with a running stitch of 6-0 Prolene.  Prior to completing the patch, I released the tourniquet and backbled all vessels, no clot was  present.  I completed this anastomosis in the usual fashion.  Thrombin and gelfoam were applied.  I gave 30 mg of Protamine to reverse anticoagulation.  After a few minutes, no further active bleeding was  present.  I reapproximated the subcutaneous tissue with a running stitch of 3-0 Vicryl.  The skin was reapproximated with a running subcuticular of 4-0 Monocryl. The skin was then cleaned, dried, and reinforced with Dermabond.  COMPLICATIONS: none  CONDITION: stable  Adele Barthel, MD [facility] [place] Office: [phone] Pager: [phone]  03/05/2013, 11:12 AM

## 2013-03-05 NOTE — Transfer of Care (Signed)
Immediate Anesthesia Transfer of Care Note  Patient: [name]  Procedure(s) Performed: Procedure(s): THROMBECTOMY OF LEFT BRACHIAL ARTERY (Left) LIGATION OF ARTERIOVENOUS  FISTULA (Left) PATCH ANGIOPLASTY (Left)  Patient Location: PACU  Anesthesia Type:General  Level of Consciousness: awake, alert , oriented and patient cooperative  Airway & Oxygen Therapy: Patient Spontanous Breathing  Post-op Assessment: Report given to PACU RN, Post -op Vital signs reviewed and stable and Patient moving all extremities X 4  Post vital signs: Reviewed and stable  Complications: No apparent anesthesia complications

## 2013-03-06 ENCOUNTER — Telehealth: Payer: Self-pay | Admitting: Vascular Surgery

## 2013-03-06 NOTE — Telephone Encounter (Addendum)
Message copied by Gena Fray on Tue Mar 06, 2013  9:55 AM ------      Message from: Denman George      Created: Mon Mar 05, 2013  1:42 PM      Regarding: [name] log                   ----- Message -----         From: Conrad Tyro, MD         Sent: 03/05/2013  11:29 AM           To: [facility] [address]            [name]      [id number]      1975/11/21                  PROCEDURE:      1. Thrombectomy of left brachial artery      2. Bovine patch angioplasty of left brachial artery      3. Ligation of left brachiocephalic arteriovenous fistula              Follow-up: 4 weeks ------  03/06/13: unable to reach patient, mailed letter, dpm

## 2013-03-07 ENCOUNTER — Encounter (HOSPITAL_COMMUNITY): Payer: Self-pay | Admitting: Vascular Surgery

## 2013-03-23 ENCOUNTER — Encounter: Payer: BC Managed Care – PPO | Admitting: Vascular Surgery

## 2013-03-23 ENCOUNTER — Other Ambulatory Visit (HOSPITAL_COMMUNITY): Payer: BC Managed Care – PPO

## 2013-03-29 ENCOUNTER — Encounter: Payer: Self-pay | Admitting: Vascular Surgery

## 2013-03-30 ENCOUNTER — Ambulatory Visit (INDEPENDENT_AMBULATORY_CARE_PROVIDER_SITE_OTHER): Payer: BC Managed Care – PPO | Admitting: Vascular Surgery

## 2013-03-30 ENCOUNTER — Encounter: Payer: Self-pay | Admitting: Vascular Surgery

## 2013-03-30 VITALS — BP 159/89 | HR 85 | Ht 69.5 in | Wt 338.0 lb

## 2013-03-30 DIAGNOSIS — I742 Embolism and thrombosis of arteries of the upper extremities: Secondary | ICD-10-CM

## 2013-03-30 DIAGNOSIS — N185 Chronic kidney disease, stage 5: Secondary | ICD-10-CM

## 2013-03-30 HISTORY — DX: Embolism and thrombosis of arteries of the upper extremities: I74.2

## 2013-03-30 NOTE — Addendum Note (Signed)
Addended by: Dorthula Rue L on: 03/30/2013 10:51 AM   Modules accepted: Orders

## 2013-03-30 NOTE — Progress Notes (Signed)
    Postoperative Access Visit   History of Present Illness  [name] is a 38 y.o. year old female who presents for postoperative follow-up for: TE and BPA of L brachial artery ,TE of radial/ulnar arteries  (Date: 03/05/13).  The patient's wounds are healed.  The patient notes no hand sx, which she did not have even with occluded brachial artery.  The patient is able to complete their activities of daily living.    For VQI Use Only  PRE-ADM LIVING: Home  AMB STATUS: Ambulatory  Physical Examination Filed Vitals:   03/30/13 0842  BP: 159/89  Pulse: 85    LUE: Incision is healed, skin feels war,, hand grip is 5/5, sensation in digits is  intact, nopalpable thrill, bruit can not be auscultated , palpable radial pulse  Medical Decision Making  [name] is a 38 y.o. year old female who presents s/p TE w/ BPA L Brachial artery, L brachial artery and radial/ulnar embolism.  Intra-operative findings were consistent with an embolism to the L brachial artery and forearm artery.  Pt has h/o pediatric cardiac surgery, so concern for possible cardiac thrombus is raised vs aortic arch thrombus.  Work-up includes: CTA Chest and TTE.  As this the second failure of a fistula, I am going to complete the work-up of the embolic phenomena prior proceeding with permanent access placement.  Adele Barthel, MD [facility] [place] Office: [phone] Pager: [phone]  03/30/2013, 8:51 AM

## 2013-04-02 ENCOUNTER — Telehealth: Payer: Self-pay | Admitting: General Practice

## 2013-04-02 ENCOUNTER — Encounter: Payer: BC Managed Care – PPO | Admitting: Obstetrics & Gynecology

## 2013-04-02 NOTE — Telephone Encounter (Signed)
Patient no showed for appt today. Called patient and she apologized stating she thought the appt was for tomorrow and would like to get the visit rescheduled. Told patient our front office staff will contact her with a new appt. Patient verbalized understanding and had no further questions

## 2013-04-12 ENCOUNTER — Other Ambulatory Visit: Payer: Self-pay

## 2013-04-12 ENCOUNTER — Other Ambulatory Visit: Payer: Self-pay | Admitting: *Deleted

## 2013-04-12 DIAGNOSIS — I749 Embolism and thrombosis of unspecified artery: Secondary | ICD-10-CM

## 2013-04-12 DIAGNOSIS — I742 Embolism and thrombosis of arteries of the upper extremities: Secondary | ICD-10-CM

## 2013-04-17 ENCOUNTER — Ambulatory Visit (HOSPITAL_COMMUNITY): Payer: BC Managed Care – PPO

## 2013-04-17 ENCOUNTER — Telehealth: Payer: Self-pay

## 2013-04-17 NOTE — Telephone Encounter (Signed)
Message copied by Denman George on Tue Apr 17, 2013 10:28 AM ------      Message from: Conrad Pickering      Created: Tue Apr 17, 2013  7:24 AM      Regarding: RE: Msg. from Dr. Marya Landry cta.  Will stick with echo first.            ----- Message -----         From: Sherrye Payor, RN         Sent: 04/13/2013  11:25 AM           To: Conrad Pomaria, MD      Subject: Msg. from Dr. Lorrene Reid                                     Rec'd phone call from Dr. Sanda Klein office.  Relayed msg. that Dr. Lorrene Reid is very concerned about pt. having CTA chest with a CR 6.0.  She is asking if there is any other testing you can do to obtain the information you need? (hx of embolism to (L) BA and forearm;  s/p Thrombect w/ BPA of (L) BA 03/05/13)       ------

## 2013-04-17 NOTE — Telephone Encounter (Signed)
CTA is to be cancelled per recommendation of Dr. Bridgett Larsson.

## 2013-04-18 ENCOUNTER — Telehealth: Payer: Self-pay | Admitting: Vascular Surgery

## 2013-04-18 NOTE — Telephone Encounter (Signed)
Pt missed 04/17/13 appointment for Echo test at [facility]. R/s this appointment for 04/24/13 at [place] patient this information. Pt verbalized understanding.

## 2013-04-18 NOTE — Telephone Encounter (Addendum)
Message copied by Gena Fray on Wed Apr 18, 2013 10:22 AM ------      Message from: Denman George      Created: Tue Apr 17, 2013 10:27 AM      Regarding: FW: CANCEL CTA                   ----- Message -----         From: Conrad [place], MD         Sent: 04/17/2013   7:24 AM           To: Lynetta Mare Pullins, RN      Subject: RE: Msg. from Dr. Marya Landry cta.  Will stick with echo first.            ----- Message -----         From: Sherrye Payor, RN         Sent: 04/13/2013  11:25 AM           To: Conrad [place], MD      Subject: Msg. from Dr. Lorrene Reid                                     Rec'd phone call from Dr. Sanda Klein office.  Relayed msg. that Dr. Lorrene Reid is very concerned about pt. having CTA chest with a CR 6.0.  She is asking if there is any other testing you can do to obtain the information you need? (hx of embolism to (L) BA and forearm;  s/p Thrombect w/ BPA of (L) BA 03/05/13)       ------  04/18/13: spoke with Mrs Riedesel and made her aware of the canceled CTA- she missed her ECHO yesterday, so I have forwarded that to [facility] to reschedule. Pt is aware that we will contact her regarding that appointment. She will follow up with [facility] on 04/23 as scheduled, dpm

## 2013-04-23 ENCOUNTER — Ambulatory Visit (HOSPITAL_COMMUNITY): Payer: BC Managed Care – PPO

## 2013-04-24 ENCOUNTER — Ambulatory Visit (HOSPITAL_COMMUNITY)
Admission: RE | Admit: 2013-04-24 | Discharge: 2013-04-24 | Disposition: A | Discharge status: Home / Self Care | Payer: BC Managed Care – PPO | Source: Ambulatory Visit | Attending: Vascular Surgery | Admitting: Vascular Surgery

## 2013-04-24 DIAGNOSIS — I059 Rheumatic mitral valve disease, unspecified: Secondary | ICD-10-CM

## 2013-04-24 DIAGNOSIS — I75019 Atheroembolism of unspecified upper extremity: Secondary | ICD-10-CM | POA: Insufficient documentation

## 2013-04-24 DIAGNOSIS — I742 Embolism and thrombosis of arteries of the upper extremities: Secondary | ICD-10-CM

## 2013-04-24 NOTE — Progress Notes (Signed)
*  PRELIMINARY RESULTS* Echocardiogram 2D Echocardiogram has been performed.  [name] 04/24/2013, 2:19 PM

## 2013-04-25 ENCOUNTER — Encounter: Payer: Self-pay | Admitting: Vascular Surgery

## 2013-04-25 ENCOUNTER — Ambulatory Visit (INDEPENDENT_AMBULATORY_CARE_PROVIDER_SITE_OTHER): Payer: BC Managed Care – PPO | Admitting: Obstetrics & Gynecology

## 2013-04-25 ENCOUNTER — Encounter: Payer: Self-pay | Admitting: Obstetrics & Gynecology

## 2013-04-25 VITALS — BP 126/70 | HR 74 | Temp 98.3°F | Resp 20 | Ht 69.5 in | Wt 333.2 lb

## 2013-04-25 DIAGNOSIS — N920 Excessive and frequent menstruation with regular cycle: Secondary | ICD-10-CM

## 2013-04-25 MED ORDER — FERROUS SULFATE 325 (65 FE) MG PO TBEC: 325.0000 mg | DELAYED_RELEASE_TABLET | Freq: Two times a day (BID) | ORAL | Status: DC

## 2013-04-25 MED ORDER — MEGESTROL ACETATE 20 MG PO TABS: 20.0000 mg | ORAL_TABLET | Freq: Two times a day (BID) | ORAL | Status: DC

## 2013-04-25 NOTE — Progress Notes (Signed)
   Subjective:    Patient ID: [name], female    DOB: [birth year], 38 y.o.   MRN: [id number]  HPI  38 yo morbidly obese lady with kidney failure was noted to have anemia during her hospitalization for a thrombus of a brachial artery. She reports heavy, long periods.  Review of Systems     Objective:   Physical Exam  Nulliparous cervix. No masses on bimanual exam but exam is limited due to her morbid obesity Small amount of blood noted at cervical os      Assessment & Plan:  Menometrorrhagia- check TSH and gyn u/s Pap done today RTC 2 weeks Continue megace and iron

## 2013-04-25 NOTE — Addendum Note (Signed)
Addended by: Michel Harrow on: 04/25/2013 02:09 PM   Modules accepted: Orders

## 2013-04-26 ENCOUNTER — Ambulatory Visit: Payer: BC Managed Care – PPO | Admitting: Vascular Surgery

## 2013-04-26 ENCOUNTER — Ambulatory Visit (INDEPENDENT_AMBULATORY_CARE_PROVIDER_SITE_OTHER): Payer: BC Managed Care – PPO | Admitting: Vascular Surgery

## 2013-04-26 ENCOUNTER — Encounter: Payer: Self-pay | Admitting: Vascular Surgery

## 2013-04-26 VITALS — BP 145/86 | HR 77 | Resp 18 | Ht 69.5 in | Wt 334.6 lb

## 2013-04-26 DIAGNOSIS — N183 Chronic kidney disease, stage 3 unspecified: Secondary | ICD-10-CM

## 2013-04-26 DIAGNOSIS — I742 Embolism and thrombosis of arteries of the upper extremities: Secondary | ICD-10-CM

## 2013-04-26 LAB — TSH: TSH: 0.914 u[IU]/mL (ref 0.350–4.500)

## 2013-04-26 NOTE — Addendum Note (Signed)
Addended by: Dorthula Rue L on: 04/26/2013 04:59 PM   Modules accepted: Orders

## 2013-04-26 NOTE — Progress Notes (Signed)
    Established Dialysis Access  History of Present Illness  [name] is a 38 y.o. (1975/02/26) female who presents for follow-up echocardiogram for embolic phenomena to left brachial artery and fistula.  The patient is righ hand dominant.  Previous access procedures have been completed in the both arms.  The patient's complication from previous access procedures include: thrombosis of R BC AVF and thrombosis of L BC AVF with embolism to brachial artery.  The CTA Chest was canceled due to concerns with her remaining renal function.  A TTE was ordered instead.  The patient's PMH, PSH, SH, FamHx, Med, and Allergies are unchanged from 03/30/13.  On ROS today: no steal sx, no pain or neuro sx in L hand  Physical Examination  Filed Vitals:   04/26/13 1418  BP: 145/86  Pulse: 77  Resp: 18  Height: 5' 9.5" (1.765 m)  Weight: 334 lb 9.6 oz (151.774 kg)   Body mass index is 48.72 kg/(m^2).  General: A&O x 3, WD, morbidly obese  Pulmonary: Sym exp, good air movt, CTAB, no rales, rhonchi, & wheezing  Cardiac: RRR, Nl S1, S2, no Murmurs, rubs or gallops  Vascular: Vessel Right Left  Radial Faintly Palpable Faintly Palpable  Ulnar Not Palpable Not Palpable  Brachial Faintly Palpable Faintly Palpable   Gastrointestinal: soft, NTND, -G/R, - HSM, - masses, - CVAT B  Musculoskeletal: M/S 5/5 throughout , Extremities without  ischemic changes , L arm incision healed, no thrill or bruit  Neurologic: Pain and light touch intact in extremities , Motor exam as listed above  Echocardiogram (04/24/13): - Left ventricle: The cavity size was mildly dilated. There was severe concentric hypertrophy. Systolic function was mildly reduced. The estimated ejection fraction was in the range of 45% to 50%. Wall motion was normal; there were no regional wall motion abnormalities. Features are consistent with a pseudonormal left ventricular filling pattern, with concomitant abnormal relaxation and  increased filling pressure (grade 2 diastolic dysfunction). Doppler parameters are consistent with elevated ventricular end-diastolic filling pressure. - Aortic valve: Trileaflet; normal thickness leaflets. No regurgitation. - Aortic root: The aortic root was normal in size. - Mitral valve: Mitral annular calcification predominantlyposterior. Mild regurgitation. - Left atrium: The atrium was moderately dilated. - Right ventricle: Systolic function was normal. - Tricuspid valve: No regurgitation. - Pulmonary arteries: Systolic pressure was within the normal range. - Inferior vena cava: The vessel was normal in size. - Pericardium, extracardiac: There was no pericardial effusion.  Medical Decision Making  [name] is a 38 y.o. female who presents with chronic kidney disease stage III-IV, embolism to L brachial artery and fistula  TTE demonstrated no cardiac thrombus.  CTA Chest cannot be done due to poor renal function.  Based on vein mapping and examination, this patient's permanent access options include: L BVT.  I am reluctant to go back into this arm given the recent events with the L brachial artery.  I would rescan the right arm for another vein option and also obtain bilateral upper extremity arterial duplexes.  The patient will follow up in 2-3 weeks with these results.  Adele Barthel, MD [facility] [place] Office: [phone] Pager: [phone]  04/26/2013, 2:29 PM

## 2013-04-27 ENCOUNTER — Ambulatory Visit: Payer: BC Managed Care – PPO | Admitting: Vascular Surgery

## 2013-05-02 ENCOUNTER — Ambulatory Visit (HOSPITAL_COMMUNITY)
Admission: RE | Admit: 2013-05-02 | Discharge: 2013-05-02 | Disposition: A | Discharge status: Home / Self Care | Payer: BC Managed Care – PPO | Source: Ambulatory Visit | Attending: Obstetrics & Gynecology | Admitting: Obstetrics & Gynecology

## 2013-05-02 DIAGNOSIS — N83209 Unspecified ovarian cyst, unspecified side: Secondary | ICD-10-CM | POA: Insufficient documentation

## 2013-05-02 DIAGNOSIS — N920 Excessive and frequent menstruation with regular cycle: Secondary | ICD-10-CM

## 2013-05-02 DIAGNOSIS — D649 Anemia, unspecified: Secondary | ICD-10-CM | POA: Insufficient documentation

## 2013-05-11 ENCOUNTER — Encounter (HOSPITAL_COMMUNITY): Payer: BC Managed Care – PPO

## 2013-05-17 ENCOUNTER — Encounter: Payer: Self-pay | Admitting: Vascular Surgery

## 2013-05-18 ENCOUNTER — Ambulatory Visit (INDEPENDENT_AMBULATORY_CARE_PROVIDER_SITE_OTHER): Payer: Self-pay | Admitting: Vascular Surgery

## 2013-05-18 ENCOUNTER — Ambulatory Visit (HOSPITAL_COMMUNITY)
Admission: RE | Admit: 2013-05-18 | Discharge: 2013-05-18 | Disposition: A | Discharge status: Home / Self Care | Payer: BC Managed Care – PPO | Source: Ambulatory Visit | Attending: Vascular Surgery | Admitting: Vascular Surgery

## 2013-05-18 ENCOUNTER — Encounter: Payer: Self-pay | Admitting: Vascular Surgery

## 2013-05-18 VITALS — BP 168/96 | HR 72 | Ht 69.5 in | Wt 342.0 lb

## 2013-05-18 DIAGNOSIS — N186 End stage renal disease: Secondary | ICD-10-CM | POA: Insufficient documentation

## 2013-05-18 DIAGNOSIS — N183 Chronic kidney disease, stage 3 unspecified: Secondary | ICD-10-CM

## 2013-05-18 DIAGNOSIS — I742 Embolism and thrombosis of arteries of the upper extremities: Secondary | ICD-10-CM

## 2013-05-18 DIAGNOSIS — N185 Chronic kidney disease, stage 5: Secondary | ICD-10-CM

## 2013-05-18 NOTE — Progress Notes (Signed)
Established Dialysis Access   History of Present Illness  [name] is a 38 y.o. female  who presents for follow-up R arm access evaluation.  Pt has previous had a R BC AVF which occluded.  She denies any right arm steal sx.  Pt is RHD.   Past Medical History  Diagnosis Date  . Hypertension   . Thyroid disease   . Morbid obesity   . Hyperlipidemia   . History of pneumonia   . Foot fracture   . Dilated cardiomyopathy   . CHF (congestive heart failure)     hosp. 2011  . Gout   . Anxiety     pt not aware of this  . Arthritis   . Anemia   . Menorrhagia   . Chronic kidney disease     not on dialysis at this time  . Encounter for blood transfusion   . Pneumonia     hosp. 2010    Past Surgical History  Procedure Laterality Date  . Cardiac surgery  1979    repair of hole in heart  . Av fistula placement  08/04/2011    Procedure: ARTERIOVENOUS (AV) FISTULA CREATION;  Surgeon: Elam Dutch, MD;  Location: [facility] OR;  Service: Vascular;  Laterality: Right;  Creation right brachiocephalic arteriovenous fistula  . Av fistula placement Left 02/05/2013    Procedure: ARTERIOVENOUS FISTULA CREATION LEFT ARM;  Surgeon: Conrad Cowlington, MD;  Location: [place];  Service: Vascular;  Laterality: Left;  . Thrombectomy w/ embolectomy Left 03/05/2013    Procedure: THROMBECTOMY OF LEFT BRACHIAL ARTERY;  Surgeon: Conrad Abilene, MD;  Location: [place];  Service: Vascular;  Laterality: Left;  . Ligation of arteriovenous  fistula Left 03/05/2013    Procedure: LIGATION OF ARTERIOVENOUS  FISTULA;  Surgeon: Conrad Mammoth, MD;  Location: [place];  Service: Vascular;  Laterality: Left;  . Patch angioplasty Left 03/05/2013    Procedure: PATCH ANGIOPLASTY;  Surgeon: Conrad , MD;  Location: [place];  Service: Vascular;  Laterality: Left;    History   Social History  . Marital Status: Single    Spouse Name: N/A    Number of Children: N/A  . Years of Education: N/A   Occupational History  . Not on file.    Social History Main Topics  . Smoking status: Never Smoker   . Smokeless tobacco: Never Used  . Alcohol Use: Yes     Comment: rare use  . Drug Use: No  . Sexual Activity: Yes    Birth Control/ Protection: None     Comment: no birth control    Other Topics Concern  . Not on file   Social History Narrative  . No narrative on file    Family History  Problem Relation Age of Onset  . Diabetes Mother   . Hyperlipidemia Mother   . Hypertension Mother   . Kidney disease Mother   . Anesthesia problems Neg Hx   . Hypotension Neg Hx   . Malignant hyperthermia Neg Hx   . Pseudochol deficiency Neg Hx     Current Outpatient Prescriptions on File Prior to Visit  Medication Sig Dispense Refill  . allopurinol (ZYLOPRIM) 100 MG tablet Take 200 mg by mouth daily.       . calcitRIOL (ROCALTROL) 0.25 MCG capsule Take 0.25 mcg by mouth 3 (three) times a week. Monday Wednesday and Friday      . Calcium Carb-Cholecalciferol (CALCIUM 500 +D PO) Take 3 tablets  by mouth daily.      . carvedilol (COREG) 25 MG tablet Take 25 mg by mouth 2 (two) times daily with a meal.      . ferrous sulfate 325 (65 FE) MG EC tablet Take 1 tablet (325 mg total) by mouth 2 (two) times daily.  60 tablet  1  . furosemide (LASIX) 80 MG tablet Take 80 mg by mouth 2 (two) times daily.      . hydrALAZINE (APRESOLINE) 25 MG tablet Take 100 mg by mouth daily.       . megestrol (MEGACE) 20 MG tablet Take 1 tablet (20 mg total) by mouth 2 (two) times daily.  60 tablet  1  . oxyCODONE (ROXICODONE) 5 MG immediate release tablet Take 1 tablet (5 mg total) by mouth every 4 (four) hours as needed for severe pain.  30 tablet  0  . oxyCODONE-acetaminophen (PERCOCET/ROXICET) 5-325 MG per tablet Take 1-2 tablets by mouth every 4 (four) hours as needed for moderate pain.       Current Facility-Administered Medications on File Prior to Visit  Medication Dose Route Frequency Provider Last Rate Last Dose  . 0.9 %  sodium chloride  infusion   Intravenous Continuous Conrad Dieterich, MD        No Known Allergies  REVIEW OF SYSTEMS:  (Positives checked otherwise negative)  CARDIOVASCULAR:  []  chest pain, []  chest pressure, []  palpitations, []  shortness of breath when laying flat, []  shortness of breath with exertion,  []  pain in feet when walking, []  pain in feet when laying flat, []  history of blood clot in veins (DVT), []  history of phlebitis, []  swelling in legs, []  varicose veins  PULMONARY:  []  productive cough, []  asthma, []  wheezing  NEUROLOGIC:  []  weakness in arms or legs, []  numbness in arms or legs, []  difficulty speaking or slurred speech, []  temporary loss of vision in one eye, []  dizziness  HEMATOLOGIC:  []  bleeding problems, []  problems with blood clotting too easily  MUSCULOSKEL:  []  joint pain, []  joint swelling  GASTROINTEST:  []  vomiting blood, []  blood in stool     GENITOURINARY:  []  burning with urination, []  blood in urine  PSYCHIATRIC:  []  history of major depression  INTEGUMENTARY:  []  rashes, []  ulcers  Physical Examination  Filed Vitals:   05/18/13 1559  BP: 168/96  Pulse: 72  Height: 5' 9.5" (1.765 m)  Weight: 342 lb (155.13 kg)  SpO2: 99%   Body mass index is 49.8 kg/(m^2).  General: A&O x 3, WD, morbidly obese   Pulmonary: Sym exp, good air movt, CTAB, no rales, rhonchi, & wheezing   Cardiac: RRR, Nl S1, S2, no Murmurs, rubs or gallops   Vascular:  Vessel  Right  Left   Radial  Faintly Palpable  Faintly Palpable   Ulnar  Not Palpable  Not Palpable   Brachial  Palpable  Faintly Palpable    Gastrointestinal: soft, NTND, -G/R, - HSM, - masses, - CVAT B   Musculoskeletal: M/S 5/5 throughout , Extremities without ischemic changes , L arm incision healed, no thrill or bruit   Neurologic: Pain and light touch intact in extremities , Motor exam as listed above   R arm vein mapping (123XX123)   Basilic vein throughout  Medical Decision Making  [name] is a 38  y.o. female  who presents with chronic kidney disease stage III-IV, embolism to L brachial artery and fistula  Based on vein mapping and examination, this patient's  permanent access options include: R or L BVT. I am reluctant to go back into this arm given the recent events with the L brachial artery, so I have offered the pt a R single stage BVT.  Risk, benefits, and alternatives to access surgery were discussed.  The patient is aware the risks include but are not limited to: bleeding, infection, steal syndrome, nerve damage, ischemic monomelic neuropathy, failure to mature, need for additional procedures, death and stroke.   The patient agrees to proceed forward with the procedure.  This is scheduled for 1 JUN 15 due to vacation conflicts.  Adele Barthel, MD [facility] Office: [phone] Pager: [phone]  05/18/2013, 4:53 PM

## 2013-05-21 ENCOUNTER — Other Ambulatory Visit: Payer: Self-pay | Admitting: *Deleted

## 2013-05-24 ENCOUNTER — Ambulatory Visit (INDEPENDENT_AMBULATORY_CARE_PROVIDER_SITE_OTHER): Payer: BC Managed Care – PPO | Admitting: Obstetrics & Gynecology

## 2013-05-24 ENCOUNTER — Encounter: Payer: Self-pay | Admitting: Obstetrics & Gynecology

## 2013-05-24 VITALS — BP 146/88 | HR 75 | Temp 97.6°F | Ht 69.5 in | Wt 340.4 lb

## 2013-05-24 DIAGNOSIS — N920 Excessive and frequent menstruation with regular cycle: Secondary | ICD-10-CM

## 2013-05-24 NOTE — Progress Notes (Signed)
   Subjective:    Patient ID: [name], female    DOB: November 24, 1975, 38 y.o.   MRN: [id number]  HPI  38 yo AA G0 is here today for follow up after her w/u for menorrhagia. Her pap, TSH, and gyn u/s were normal. Her Hbg went from 8 to 11. She is still taking the megace but tells me today that she is trying to conceive.  Review of Systems     Objective:   Physical Exam        Assessment & Plan:  Menorrhagia- w/u negative I have advised her that she should stop the megace if she wants to conceive. I have also recommended weight loss and that she start a MVI daily. RTC 1 year/prn sooner

## 2013-06-01 ENCOUNTER — Encounter (HOSPITAL_COMMUNITY): Payer: Self-pay | Admitting: *Deleted

## 2013-06-01 ENCOUNTER — Encounter (HOSPITAL_COMMUNITY): Payer: Self-pay

## 2013-06-03 MED ORDER — DEXTROSE 5 % IV SOLN
1.5000 g | INTRAVENOUS | Status: DC
  Filled 2013-06-03: qty 1.5

## 2013-06-04 ENCOUNTER — Telehealth: Payer: Self-pay | Admitting: Vascular Surgery

## 2013-06-04 ENCOUNTER — Ambulatory Visit (HOSPITAL_COMMUNITY): Payer: BC Managed Care – PPO | Admitting: Certified Registered"

## 2013-06-04 ENCOUNTER — Encounter (HOSPITAL_COMMUNITY): Payer: Self-pay | Admitting: Certified Registered"

## 2013-06-04 ENCOUNTER — Ambulatory Visit (HOSPITAL_COMMUNITY)
Admission: RE | Admit: 2013-06-04 | Discharge: 2013-06-04 | Disposition: A | Discharge status: Home / Self Care | Payer: BC Managed Care – PPO | Source: Ambulatory Visit | Attending: Vascular Surgery | Admitting: Vascular Surgery

## 2013-06-04 ENCOUNTER — Encounter (HOSPITAL_COMMUNITY)
Admission: RE | Disposition: A | Discharge status: Home / Self Care | Payer: Self-pay | Source: Ambulatory Visit | Attending: Vascular Surgery

## 2013-06-04 ENCOUNTER — Encounter (HOSPITAL_COMMUNITY): Payer: BC Managed Care – PPO | Admitting: Certified Registered"

## 2013-06-04 ENCOUNTER — Other Ambulatory Visit: Payer: Self-pay | Admitting: *Deleted

## 2013-06-04 DIAGNOSIS — M109 Gout, unspecified: Secondary | ICD-10-CM | POA: Insufficient documentation

## 2013-06-04 DIAGNOSIS — N185 Chronic kidney disease, stage 5: Secondary | ICD-10-CM

## 2013-06-04 DIAGNOSIS — Z79899 Other long term (current) drug therapy: Secondary | ICD-10-CM | POA: Insufficient documentation

## 2013-06-04 DIAGNOSIS — N184 Chronic kidney disease, stage 4 (severe): Secondary | ICD-10-CM | POA: Insufficient documentation

## 2013-06-04 DIAGNOSIS — N186 End stage renal disease: Secondary | ICD-10-CM

## 2013-06-04 DIAGNOSIS — D649 Anemia, unspecified: Secondary | ICD-10-CM | POA: Insufficient documentation

## 2013-06-04 DIAGNOSIS — F411 Generalized anxiety disorder: Secondary | ICD-10-CM | POA: Insufficient documentation

## 2013-06-04 DIAGNOSIS — Z4931 Encounter for adequacy testing for hemodialysis: Secondary | ICD-10-CM

## 2013-06-04 DIAGNOSIS — I739 Peripheral vascular disease, unspecified: Secondary | ICD-10-CM | POA: Insufficient documentation

## 2013-06-04 DIAGNOSIS — I509 Heart failure, unspecified: Secondary | ICD-10-CM | POA: Insufficient documentation

## 2013-06-04 DIAGNOSIS — Z6841 Body Mass Index (BMI) 40.0 and over, adult: Secondary | ICD-10-CM | POA: Insufficient documentation

## 2013-06-04 DIAGNOSIS — I129 Hypertensive chronic kidney disease with stage 1 through stage 4 chronic kidney disease, or unspecified chronic kidney disease: Secondary | ICD-10-CM | POA: Insufficient documentation

## 2013-06-04 HISTORY — PX: AV FISTULA PLACEMENT: SHX1204

## 2013-06-04 HISTORY — DX: Personal history of other medical treatment: Z92.89

## 2013-06-04 LAB — POCT I-STAT 4, (NA,K, GLUC, HGB,HCT)
GLUCOSE: 97 mg/dL (ref 70–99)
HEMATOCRIT: 31 % — AB (ref 36.0–46.0)
Hemoglobin: 10.5 g/dL — ABNORMAL LOW (ref 12.0–15.0)
Potassium: 3.9 mEq/L (ref 3.7–5.3)
SODIUM: 141 meq/L (ref 137–147)

## 2013-06-04 LAB — HCG, SERUM, QUALITATIVE: Preg, Serum: NEGATIVE

## 2013-06-04 SURGERY — ARTERIOVENOUS (AV) FISTULA CREATION
Anesthesia: General | Site: Arm Upper | Laterality: Right

## 2013-06-04 MED ORDER — 0.9 % SODIUM CHLORIDE (POUR BTL) OPTIME
TOPICAL | Status: DC | PRN
  Administered 2013-06-04: 1000 mL

## 2013-06-04 MED ORDER — PHENYLEPHRINE HCL 10 MG/ML IJ SOLN
INTRAMUSCULAR | Status: DC | PRN
  Administered 2013-06-04 (×4): 40 ug via INTRAVENOUS

## 2013-06-04 MED ORDER — MIDAZOLAM HCL 2 MG/2ML IJ SOLN
INTRAMUSCULAR | Status: AC
  Filled 2013-06-04: qty 2

## 2013-06-04 MED ORDER — LIDOCAINE HCL (CARDIAC) 20 MG/ML IV SOLN
INTRAVENOUS | Status: DC | PRN
  Administered 2013-06-04: 80 mg via INTRAVENOUS

## 2013-06-04 MED ORDER — OXYCODONE HCL 5 MG/5ML PO SOLN: 5.0000 mg | Freq: Once | ORAL | Status: DC | PRN

## 2013-06-04 MED ORDER — FENTANYL CITRATE 0.05 MG/ML IJ SOLN
INTRAMUSCULAR | Status: AC
  Filled 2013-06-04: qty 5

## 2013-06-04 MED ORDER — HYDROMORPHONE HCL PF 1 MG/ML IJ SOLN
0.2500 mg | INTRAMUSCULAR | Status: DC | PRN
  Administered 2013-06-04 (×2): 0.5 mg via INTRAVENOUS

## 2013-06-04 MED ORDER — MIDAZOLAM HCL 5 MG/5ML IJ SOLN
INTRAMUSCULAR | Status: DC | PRN
  Administered 2013-06-04 (×2): 1 mg via INTRAVENOUS

## 2013-06-04 MED ORDER — OXYCODONE HCL 5 MG PO TABS: 5.0000 mg | ORAL_TABLET | Freq: Once | ORAL | Status: DC | PRN

## 2013-06-04 MED ORDER — ONDANSETRON HCL 4 MG/2ML IJ SOLN: 4.0000 mg | Freq: Once | INTRAMUSCULAR | Status: DC | PRN

## 2013-06-04 MED ORDER — SODIUM CHLORIDE 0.9 % IR SOLN
Status: DC | PRN
  Administered 2013-06-04: 08:00:00

## 2013-06-04 MED ORDER — SUCCINYLCHOLINE CHLORIDE 20 MG/ML IJ SOLN
INTRAMUSCULAR | Status: AC
  Filled 2013-06-04: qty 1

## 2013-06-04 MED ORDER — THROMBIN 20000 UNITS EX SOLR
CUTANEOUS | Status: AC
  Filled 2013-06-04: qty 20000

## 2013-06-04 MED ORDER — CHLORHEXIDINE GLUCONATE CLOTH 2 % EX PADS: 6.0000 | MEDICATED_PAD | Freq: Once | CUTANEOUS | Status: DC

## 2013-06-04 MED ORDER — ROCURONIUM BROMIDE 50 MG/5ML IV SOLN
INTRAVENOUS | Status: AC
  Filled 2013-06-04: qty 1

## 2013-06-04 MED ORDER — MEPERIDINE HCL 25 MG/ML IJ SOLN: 6.2500 mg | INTRAMUSCULAR | Status: DC | PRN

## 2013-06-04 MED ORDER — LIDOCAINE HCL (CARDIAC) 20 MG/ML IV SOLN
INTRAVENOUS | Status: AC
  Filled 2013-06-04: qty 5

## 2013-06-04 MED ORDER — PROPOFOL 10 MG/ML IV BOLUS
INTRAVENOUS | Status: AC
  Filled 2013-06-04: qty 20

## 2013-06-04 MED ORDER — FENTANYL CITRATE 0.05 MG/ML IJ SOLN
INTRAMUSCULAR | Status: DC | PRN
  Administered 2013-06-04: 100 ug via INTRAVENOUS

## 2013-06-04 MED ORDER — SODIUM CHLORIDE 0.9 % IJ SOLN
INTRAMUSCULAR | Status: AC
  Filled 2013-06-04: qty 10

## 2013-06-04 MED ORDER — PHENYLEPHRINE 40 MCG/ML (10ML) SYRINGE FOR IV PUSH (FOR BLOOD PRESSURE SUPPORT)
PREFILLED_SYRINGE | INTRAVENOUS | Status: AC
  Filled 2013-06-04: qty 10

## 2013-06-04 MED ORDER — ONDANSETRON HCL 4 MG/2ML IJ SOLN
INTRAMUSCULAR | Status: DC | PRN
  Administered 2013-06-04: 4 mg via INTRAVENOUS

## 2013-06-04 MED ORDER — OXYCODONE HCL 5 MG PO TABS: 5.0000 mg | ORAL_TABLET | Freq: Four times a day (QID) | ORAL | Status: DC | PRN

## 2013-06-04 MED ORDER — HYDROMORPHONE HCL PF 1 MG/ML IJ SOLN
INTRAMUSCULAR | Status: DC
Start: 2013-06-04 — End: 2013-06-04
  Filled 2013-06-04: qty 1

## 2013-06-04 MED ORDER — PHENYLEPHRINE HCL 10 MG/ML IJ SOLN
10.0000 mg | INTRAMUSCULAR | Status: DC | PRN
  Administered 2013-06-04: 20 ug/min via INTRAVENOUS

## 2013-06-04 MED ORDER — PROPOFOL 10 MG/ML IV BOLUS
INTRAVENOUS | Status: DC | PRN
  Administered 2013-06-04: 200 mg via INTRAVENOUS

## 2013-06-04 MED ORDER — SODIUM CHLORIDE 0.9 % IV SOLN: INTRAVENOUS | Status: DC

## 2013-06-04 SURGICAL SUPPLY — 39 items
ADH SKN CLS APL DERMABOND .7 (GAUZE/BANDAGES/DRESSINGS) ×2
BLADE 10 SAFETY STRL DISP (BLADE) ×3 IMPLANT
CANISTER SUCTION 2500CC (MISCELLANEOUS) ×3 IMPLANT
CLIP TI MEDIUM 24 (CLIP) ×3 IMPLANT
CLIP TI WIDE RED SMALL 24 (CLIP) ×3 IMPLANT
CORDS BIPOLAR (ELECTRODE) IMPLANT
COVER PROBE W GEL 5X96 (DRAPES) IMPLANT
COVER SURGICAL LIGHT HANDLE (MISCELLANEOUS) ×3 IMPLANT
DECANTER SPIKE VIAL GLASS SM (MISCELLANEOUS) ×3 IMPLANT
DERMABOND ADVANCED (GAUZE/BANDAGES/DRESSINGS) ×1
DERMABOND ADVANCED .7 DNX12 (GAUZE/BANDAGES/DRESSINGS) ×2 IMPLANT
ELECT REM PT RETURN 9FT ADLT (ELECTROSURGICAL) ×3
ELECTRODE REM PT RTRN 9FT ADLT (ELECTROSURGICAL) ×2 IMPLANT
GLOVE BIO SURGEON STRL SZ 6.5 (GLOVE) ×2 IMPLANT
GLOVE BIO SURGEON STRL SZ7 (GLOVE) ×3 IMPLANT
GLOVE BIOGEL PI IND STRL 6.5 (GLOVE) ×2 IMPLANT
GLOVE BIOGEL PI IND STRL 7.0 (GLOVE) ×2 IMPLANT
GLOVE BIOGEL PI IND STRL 7.5 (GLOVE) ×2 IMPLANT
GLOVE BIOGEL PI INDICATOR 6.5 (GLOVE) ×2
GLOVE BIOGEL PI INDICATOR 7.0 (GLOVE) ×2
GLOVE BIOGEL PI INDICATOR 7.5 (GLOVE) ×1
GOWN STRL REUS W/ TWL LRG LVL3 (GOWN DISPOSABLE) ×6 IMPLANT
GOWN STRL REUS W/TWL LRG LVL3 (GOWN DISPOSABLE) ×9
KIT BASIN OR (CUSTOM PROCEDURE TRAY) ×3 IMPLANT
KIT ROOM TURNOVER OR (KITS) ×3 IMPLANT
NS IRRIG 1000ML POUR BTL (IV SOLUTION) ×3 IMPLANT
PACK CV ACCESS (CUSTOM PROCEDURE TRAY) ×3 IMPLANT
PAD ARMBOARD 7.5X6 YLW CONV (MISCELLANEOUS) ×6 IMPLANT
SPONGE SURGIFOAM ABS GEL 100 (HEMOSTASIS) IMPLANT
SUT MNCRL AB 4-0 PS2 18 (SUTURE) ×3 IMPLANT
SUT PROLENE 6 0 BV (SUTURE) ×3 IMPLANT
SUT PROLENE 7 0 BV 1 (SUTURE) IMPLANT
SUT SILK 2 0 SH (SUTURE) ×3 IMPLANT
SUT VIC AB 3-0 SH 27 (SUTURE) ×3
SUT VIC AB 3-0 SH 27X BRD (SUTURE) ×2 IMPLANT
TOWEL OR 17X24 6PK STRL BLUE (TOWEL DISPOSABLE) ×3 IMPLANT
TOWEL OR 17X26 10 PK STRL BLUE (TOWEL DISPOSABLE) ×3 IMPLANT
UNDERPAD 30X30 INCONTINENT (UNDERPADS AND DIAPERS) ×3 IMPLANT
WATER STERILE IRR 1000ML POUR (IV SOLUTION) ×3 IMPLANT

## 2013-06-04 NOTE — Anesthesia Procedure Notes (Signed)
Procedure Name: LMA Insertion Date/Time: 06/04/2013 7:26 AM Performed by: Maeola Harman Pre-anesthesia Checklist: Patient identified, Emergency Drugs available, Suction available, Patient being monitored and Timeout performed Patient Re-evaluated:Patient Re-evaluated prior to inductionOxygen Delivery Method: Circle system utilized Preoxygenation: Pre-oxygenation with 100% oxygen Intubation Type: IV induction Ventilation: Mask ventilation without difficulty LMA: LMA inserted LMA Size: 4.0 Number of attempts: 1 Placement Confirmation: positive ETCO2 and breath sounds checked- equal and bilateral Tube secured with: Tape Dental Injury: Teeth and Oropharynx as per pre-operative assessment  Comments: Easy atraumatic induction and LMA insertion.  Placement verified by Dr Conrad [place].  Waldron Session, CRNA

## 2013-06-04 NOTE — Transfer of Care (Signed)
Immediate Anesthesia Transfer of Care Note  Patient: [name]  Procedure(s) Performed: Procedure(s): ARTERIOVENOUS (AV) FISTULA CREATION (Right)  Patient Location: PACU  Anesthesia Type:General  Level of Consciousness: awake, alert  and oriented  Airway & Oxygen Therapy: Patient connected to face mask oxygen  Post-op Assessment: Report given to PACU RN  Post vital signs: stable  Complications: No apparent anesthesia complications

## 2013-06-04 NOTE — Interval H&P Note (Signed)
[facility]  History and Physical Update  The patient was interviewed and re-examined.  The patient's previous History and Physical has been reviewed and is unchanged.  There is no change in the plan of care: R single stage BVT.  Adele Barthel, MD [facility] [place] Office: [phone] Pager: [phone]  06/04/2013, 7:15 AM

## 2013-06-04 NOTE — Anesthesia Preprocedure Evaluation (Addendum)
Anesthesia Evaluation  Patient identified by MRN, date of birth, ID band Patient awake    Reviewed: Allergy & Precautions, H&P , NPO status , Patient's Chart, lab work & pertinent test results, reviewed documented beta blocker date and time   Airway Mallampati: II TM Distance: >3 FB Neck ROM: Full    Dental  (+) Teeth Intact, Dental Advisory Given   Pulmonary shortness of breath and with exertion, pneumonia -, resolved,  breath sounds clear to auscultation        Cardiovascular hypertension, Pt. on medications and Pt. on home beta blockers + Peripheral Vascular Disease and +CHF Rhythm:Regular     Neuro/Psych Anxiety    GI/Hepatic   Endo/Other    Renal/GU CRFRenal disease     Musculoskeletal   Abdominal (+)  Abdomen: soft. Bowel sounds: normal.  Peds  Hematology  (+) anemia ,   Anesthesia Other Findings   Reproductive/Obstetrics                       Anesthesia Physical Anesthesia Plan  ASA: III  Anesthesia Plan: General   Post-op Pain Management:    Induction: Intravenous  Airway Management Planned: LMA  Additional Equipment:   Intra-op Plan:   Post-operative Plan: Extubation in OR  Informed Consent: I have reviewed the patients History and Physical, chart, labs and discussed the procedure including the risks, benefits and alternatives for the proposed anesthesia with the patient or authorized representative who has indicated his/her understanding and acceptance.     Plan Discussed with: CRNA and Surgeon  Anesthesia Plan Comments:        Anesthesia Quick Evaluation

## 2013-06-04 NOTE — H&P (View-Only) (Signed)
Established Dialysis Access   History of Present Illness  [name] is a 38 y.o. female  who presents for follow-up R arm access evaluation.  Pt has previous had a R BC AVF which occluded.  She denies any right arm steal sx.  Pt is RHD.   Past Medical History  Diagnosis Date  . Hypertension   . Thyroid disease   . Morbid obesity   . Hyperlipidemia   . History of pneumonia   . Foot fracture   . Dilated cardiomyopathy   . CHF (congestive heart failure)     hosp. 2011  . Gout   . Anxiety     pt not aware of this  . Arthritis   . Anemia   . Menorrhagia   . Chronic kidney disease     not on dialysis at this time  . Encounter for blood transfusion   . Pneumonia     hosp. 2010    Past Surgical History  Procedure Laterality Date  . Cardiac surgery  1979    repair of hole in heart  . Av fistula placement  08/04/2011    Procedure: ARTERIOVENOUS (AV) FISTULA CREATION;  Surgeon: Elam Dutch, MD;  Location: [facility] OR;  Service: Vascular;  Laterality: Right;  Creation right brachiocephalic arteriovenous fistula  . Av fistula placement Left 02/05/2013    Procedure: ARTERIOVENOUS FISTULA CREATION LEFT ARM;  Surgeon: Conrad Clermont, MD;  Location: [place];  Service: Vascular;  Laterality: Left;  . Thrombectomy w/ embolectomy Left 03/05/2013    Procedure: THROMBECTOMY OF LEFT BRACHIAL ARTERY;  Surgeon: Conrad [place]una Vista, MD;  Location: [place];  Service: Vascular;  Laterality: Left;  . Ligation of arteriovenous  fistula Left 03/05/2013    Procedure: LIGATION OF ARTERIOVENOUS  FISTULA;  Surgeon: Conrad Saxon, MD;  Location: [place];  Service: Vascular;  Laterality: Left;  . Patch angioplasty Left 03/05/2013    Procedure: PATCH ANGIOPLASTY;  Surgeon: Conrad Staunton, MD;  Location: [place];  Service: Vascular;  Laterality: Left;    History   Social History  . Marital Status: Single    Spouse Name: N/A    Number of Children: N/A  . Years of Education: N/A   Occupational History  . Not on file.    Social History Main Topics  . Smoking status: Never Smoker   . Smokeless tobacco: Never Used  . Alcohol Use: Yes     Comment: rare use  . Drug Use: No  . Sexual Activity: Yes    Birth Control/ Protection: None     Comment: no birth control    Other Topics Concern  . Not on file   Social History Narrative  . No narrative on file    Family History  Problem Relation Age of Onset  . Diabetes Mother   . Hyperlipidemia Mother   . Hypertension Mother   . Kidney disease Mother   . Anesthesia problems Neg Hx   . Hypotension Neg Hx   . Malignant hyperthermia Neg Hx   . Pseudochol deficiency Neg Hx     Current Outpatient Prescriptions on File Prior to Visit  Medication Sig Dispense Refill  . allopurinol (ZYLOPRIM) 100 MG tablet Take 200 mg by mouth daily.       . calcitRIOL (ROCALTROL) 0.25 MCG capsule Take 0.25 mcg by mouth 3 (three) times a week. Monday Wednesday and Friday      . Calcium Carb-Cholecalciferol (CALCIUM 500 +D PO) Take 3 tablets  by mouth daily.      . carvedilol (COREG) 25 MG tablet Take 25 mg by mouth 2 (two) times daily with a meal.      . ferrous sulfate 325 (65 FE) MG EC tablet Take 1 tablet (325 mg total) by mouth 2 (two) times daily.  60 tablet  1  . furosemide (LASIX) 80 MG tablet Take 80 mg by mouth 2 (two) times daily.      . hydrALAZINE (APRESOLINE) 25 MG tablet Take 100 mg by mouth daily.       . megestrol (MEGACE) 20 MG tablet Take 1 tablet (20 mg total) by mouth 2 (two) times daily.  60 tablet  1  . oxyCODONE (ROXICODONE) 5 MG immediate release tablet Take 1 tablet (5 mg total) by mouth every 4 (four) hours as needed for severe pain.  30 tablet  0  . oxyCODONE-acetaminophen (PERCOCET/ROXICET) 5-325 MG per tablet Take 1-2 tablets by mouth every 4 (four) hours as needed for moderate pain.       Current Facility-Administered Medications on File Prior to Visit  Medication Dose Route Frequency Provider Last Rate Last Dose  . 0.9 %  sodium chloride  infusion   Intravenous Continuous Conrad Hazel, MD        No Known Allergies  REVIEW OF SYSTEMS:  (Positives checked otherwise negative)  CARDIOVASCULAR:  []  chest pain, []  chest pressure, []  palpitations, []  shortness of breath when laying flat, []  shortness of breath with exertion,  []  pain in feet when walking, []  pain in feet when laying flat, []  history of blood clot in veins (DVT), []  history of phlebitis, []  swelling in legs, []  varicose veins  PULMONARY:  []  productive cough, []  asthma, []  wheezing  NEUROLOGIC:  []  weakness in arms or legs, []  numbness in arms or legs, []  difficulty speaking or slurred speech, []  temporary loss of vision in one eye, []  dizziness  HEMATOLOGIC:  []  bleeding problems, []  problems with blood clotting too easily  MUSCULOSKEL:  []  joint pain, []  joint swelling  GASTROINTEST:  []  vomiting blood, []  blood in stool     GENITOURINARY:  []  burning with urination, []  blood in urine  PSYCHIATRIC:  []  history of major depression  INTEGUMENTARY:  []  rashes, []  ulcers  Physical Examination  Filed Vitals:   05/18/13 1559  BP: 168/96  Pulse: 72  Height: 5' 9.5" (1.765 m)  Weight: 342 lb (155.13 kg)  SpO2: 99%   Body mass index is 49.8 kg/(m^2).  General: A&O x 3, WD, morbidly obese   Pulmonary: Sym exp, good air movt, CTAB, no rales, rhonchi, & wheezing   Cardiac: RRR, Nl S1, S2, no Murmurs, rubs or gallops   Vascular:  Vessel  Right  Left   Radial  Faintly Palpable  Faintly Palpable   Ulnar  Not Palpable  Not Palpable   Brachial  Palpable  Faintly Palpable    Gastrointestinal: soft, NTND, -G/R, - HSM, - masses, - CVAT B   Musculoskeletal: M/S 5/5 throughout , Extremities without ischemic changes , L arm incision healed, no thrill or bruit   Neurologic: Pain and light touch intact in extremities , Motor exam as listed above   R arm vein mapping (123XX123)   Basilic vein throughout  Medical Decision Making  [name] is a 39  y.o. female  who presents with chronic kidney disease stage III-IV, embolism to L brachial artery and fistula  Based on vein mapping and examination, this patient's  permanent access options include: R or L BVT. I am reluctant to go back into this arm given the recent events with the L brachial artery, so I have offered the pt a R single stage BVT.  Risk, benefits, and alternatives to access surgery were discussed.  The patient is aware the risks include but are not limited to: bleeding, infection, steal syndrome, nerve damage, ischemic monomelic neuropathy, failure to mature, need for additional procedures, death and stroke.   The patient agrees to proceed forward with the procedure.  This is scheduled for 1 JUN 15 due to vacation conflicts.  Adele Barthel, MD [facility] Office: [phone] Pager: [phone]  05/18/2013, 4:53 PM

## 2013-06-04 NOTE — Anesthesia Postprocedure Evaluation (Signed)
Anesthesia Post Note  Patient: [name]  Procedure(s) Performed: Procedure(s) (LRB): ARTERIOVENOUS (AV) FISTULA CREATION (Right)  Anesthesia type: general  Patient location: PACU  Post pain: Pain level controlled  Post assessment: Patient's Cardiovascular Status Stable  Last Vitals:  Filed Vitals:   06/04/13 1137  BP:   Pulse:   Temp: 36.9 C  Resp:     Post vital signs: Reviewed and stable  Level of consciousness: sedated  Complications: No apparent anesthesia complications

## 2013-06-04 NOTE — Telephone Encounter (Addendum)
Message copied by Doristine Section on Mon Jun 04, 2013  2:41 PM ------      Message from: Peter Minium K      Created: Mon Jun 04, 2013  9:44 AM      Regarding: Schedule                   ----- Message -----         From: Alvia Grove, PA-C         Sent: 06/04/2013   9:20 AM           To: [facility] Charge Pool            S/p right 1st stage BVT (brachiobrachial AVF) 06/04/13            F/u with Dr. Bridgett Larsson in 4-6 weeks. He does not want a duplex at f/u.             Thank you.            Kim ------  notified patient of post op appt. with dr. Bridgett Larsson on 06-29-13 at 8:30

## 2013-06-04 NOTE — Op Note (Signed)
OPERATIVE NOTE   PROCEDURE: 1. right first stage brachial vein transposition (brachiobrachial arteriovenous fistula) placement  PRE-OPERATIVE DIAGNOSIS: chronic kidney disease stage IV   POST-OPERATIVE DIAGNOSIS: same as above   SURGEON: Adele Barthel, MD  ASSISTANT(S): Silva Bandy, PAC  ANESTHESIA: general  ESTIMATED BLOOD LOSS: 50 cc  FINDING(S): 1. Short basilic vein segment empties into brachial vein in distal 1/3 of arm 2. Palpable thrill and radial pulse at end of case  SPECIMEN(S):  noen  INDICATIONS:   [name] is a 38 y.o. female who presents with chronic kidney disease stage IV .  The patient is scheduled for right single stage basilic vein transposition.  The patient is aware the risks include but are not limited to: bleeding, infection, steal syndrome, nerve damage, ischemic monomelic neuropathy, failure to mature, and need for additional procedures.  The patient is aware of the risks of the procedure and elects to proceed forward.  DESCRIPTION: After full informed written consent was obtained from the patient, the patient was brought back to the operating room and placed supine upon the operating table.  Prior to induction, the patient received IV antibiotics.   After obtaining adequate anesthesia, the patient was then prepped and draped in the standard fashion for a right arm access procedure.  I turned my attention first to identifying the patient's basilic vein and brachial artery.  Using SonoSite guidance, the location of these vessels were marked out on the skin.   After interrogating the basilic vein more proximally, it became evident that this basilic vein was only short segment and emptied into the distal 1/3 of the brachial vein system.  Given this patient's obesity, I doubt that the original plan of skip incisions is possible with this patient.  I had not discussed a more extensive mobilization of the brachial vein with the patient, so I elected to stage  this procedure.  As there was no significant difference in lumen between the brachial vein at the antecubitum and the basilic vein, I elected to proceed with a stage brachial vein transposition.  I made a slightly oblique  incision at the level of the antecubitum and dissected through the subcutaneous tissue and fascia to gain exposure of the brachial artery.  This was noted to be 5 mm in diameter externally.  This was dissected out proximally and distally and controlled with vessel loops .  I then dissected out the brachial vein.  This was noted to be 4 mm in diameter externally.  The distal segment of the vein was ligated with a  2-0 silk, and the vein was transected.  The proximal segment was iinterrogated with serial dilators.  The vein accepted up to a 5 mm dilator without any difficulty.  I then instilled the heparinized saline into the vein and clamped it.  At this point, I reset my exposure of the brachial artery and placed the artery under tension proximally and distally.  I made an arteriotomy with a #11 blade, and then I extended the arteriotomy with a Potts scissor.  I injected heparinized saline proximal and distal to this arteriotomy.  The vein was then sewn to the artery in an end-to-side configuration with a running stitch of 7-0 Prolene.  Prior to completing this anastomosis, I allowed the vein and artery to backbleed.  There was no evidence of clot from any vessels.  I completed the anastomosis in the usual fashion and then released all vessel loops and clamps.  There was a palpable  thrill in the venous outflow, and there was a palpable radial pulse.  At this point, I irrigated out the surgical wound.  There was no further active bleeding.  The subcutaneous tissue was reapproximated with a running stitch of 3-0 Vicryl.  The skin was then reapproximated with a running subcuticular stitch of 4-0 Vicryl.  The skin was then cleaned, dried, and reinforced with Dermabond.  The patient tolerated this  procedure well.   COMPLICATIONS: none  CONDITION: stable  Adele Barthel, MD [facility] Office: [phone] Pager: [phone]  06/04/2013, 8:49 AM

## 2013-06-05 ENCOUNTER — Other Ambulatory Visit (HOSPITAL_COMMUNITY): Payer: Self-pay | Admitting: *Deleted

## 2013-06-05 ENCOUNTER — Encounter (HOSPITAL_COMMUNITY): Payer: Self-pay | Admitting: Vascular Surgery

## 2013-06-06 ENCOUNTER — Encounter (HOSPITAL_COMMUNITY)
Admission: RE | Admit: 2013-06-06 | Discharge: 2013-06-06 | Disposition: A | Discharge status: Home / Self Care | Payer: BC Managed Care – PPO | Source: Ambulatory Visit | Attending: Nephrology | Admitting: Nephrology

## 2013-06-06 DIAGNOSIS — D509 Iron deficiency anemia, unspecified: Secondary | ICD-10-CM | POA: Insufficient documentation

## 2013-06-06 MED ORDER — FERUMOXYTOL INJECTION 510 MG/17 ML: 510.0000 mg | INTRAVENOUS | Status: DC

## 2013-06-06 MED ORDER — SODIUM CHLORIDE 0.9 % IV SOLN
510.0000 mg | INTRAVENOUS | Status: DC
  Administered 2013-06-06: 510 mg via INTRAVENOUS
  Filled 2013-06-06: qty 17

## 2013-06-13 ENCOUNTER — Encounter (HOSPITAL_COMMUNITY): Payer: BC Managed Care – PPO

## 2013-06-14 ENCOUNTER — Encounter (HOSPITAL_COMMUNITY)
Admission: RE | Admit: 2013-06-14 | Discharge: 2013-06-14 | Disposition: A | Discharge status: Home / Self Care | Payer: BC Managed Care – PPO | Source: Ambulatory Visit | Attending: Nephrology | Admitting: Nephrology

## 2013-06-14 DIAGNOSIS — D509 Iron deficiency anemia, unspecified: Secondary | ICD-10-CM | POA: Diagnosis not present

## 2013-06-14 MED ORDER — SODIUM CHLORIDE 0.9 % IV SOLN
510.0000 mg | Freq: Once | INTRAVENOUS | Status: AC
  Administered 2013-06-14: 510 mg via INTRAVENOUS
  Filled 2013-06-14: qty 17

## 2013-06-28 ENCOUNTER — Encounter: Payer: Self-pay | Admitting: Vascular Surgery

## 2013-06-29 ENCOUNTER — Ambulatory Visit (INDEPENDENT_AMBULATORY_CARE_PROVIDER_SITE_OTHER): Payer: Self-pay | Admitting: Vascular Surgery

## 2013-06-29 ENCOUNTER — Encounter: Payer: Self-pay | Admitting: Vascular Surgery

## 2013-06-29 VITALS — BP 138/70 | HR 84 | Ht 69.0 in | Wt 344.0 lb

## 2013-06-29 DIAGNOSIS — N183 Chronic kidney disease, stage 3 unspecified: Secondary | ICD-10-CM

## 2013-06-29 DIAGNOSIS — Z4931 Encounter for adequacy testing for hemodialysis: Secondary | ICD-10-CM

## 2013-06-29 NOTE — Progress Notes (Signed)
    Postoperative Access Visit   History of Present Illness  AKIA STREIT is a 38 y.o. year old female who presents for postoperative follow-up for: R 1st Stage BRVT (Date: 06/04/13).  The patient's wounds are healed.  The patient notes no steal symptoms.  The patient is able to complete their activities of daily living.  The patient's current symptoms are: none.  For VQI Use Only  PRE-ADM LIVING: Home  AMB STATUS: Ambulatory  Physical Examination Filed Vitals:   06/29/13 0849  BP: 138/70  Pulse: 84    RUE: Incision is healed, skin feels warm, hand grip is 5/5, sensation in digits is intact, palpable thrill, bruit can be auscultated , on Sonosite: brachial vein 5 mm at most  Medical Decision Making  KRINA BARYLSKI is a 38 y.o. year old female who presents s/p R 1st stage BRVT.  Pt will return in one month for re-evaluation of maturation with formal access duplex.  Thank you for allowing [place] to participate in this patient's care.  Adele Barthel, MD [facility] Office: [phone] Pager: [phone]  06/29/2013, 9:11 AM

## 2013-06-29 NOTE — Addendum Note (Signed)
Addended by: Mena Goes on: 06/29/2013 02:06 PM   Modules accepted: Orders

## 2013-08-02 ENCOUNTER — Encounter: Payer: Self-pay | Admitting: Vascular Surgery

## 2013-08-03 ENCOUNTER — Ambulatory Visit (HOSPITAL_COMMUNITY)
Admission: RE | Admit: 2013-08-03 | Discharge: 2013-08-03 | Disposition: A | Discharge status: Home / Self Care | Payer: BC Managed Care – PPO | Source: Ambulatory Visit | Attending: Vascular Surgery | Admitting: Vascular Surgery

## 2013-08-03 ENCOUNTER — Other Ambulatory Visit: Payer: Self-pay | Admitting: Vascular Surgery

## 2013-08-03 ENCOUNTER — Encounter: Payer: Self-pay | Admitting: Vascular Surgery

## 2013-08-03 ENCOUNTER — Ambulatory Visit (INDEPENDENT_AMBULATORY_CARE_PROVIDER_SITE_OTHER): Payer: Self-pay | Admitting: Vascular Surgery

## 2013-08-03 VITALS — BP 140/74 | HR 73 | Ht 69.0 in | Wt 342.0 lb

## 2013-08-03 DIAGNOSIS — Z4931 Encounter for adequacy testing for hemodialysis: Secondary | ICD-10-CM | POA: Insufficient documentation

## 2013-08-03 DIAGNOSIS — N184 Chronic kidney disease, stage 4 (severe): Secondary | ICD-10-CM | POA: Insufficient documentation

## 2013-08-03 DIAGNOSIS — N183 Chronic kidney disease, stage 3 unspecified: Secondary | ICD-10-CM

## 2013-08-03 DIAGNOSIS — N186 End stage renal disease: Secondary | ICD-10-CM | POA: Insufficient documentation

## 2013-08-03 NOTE — Progress Notes (Signed)
POST OPERATIVE OFFICE NOTE  CC:  F/u for surgery  HPI:  This is a 38 y.o. female who is s/p 1st stage right brachial vein transposition on 06/04/13 by Dr. Bridgett Larsson.  She returns today and is doing well.  She denies any symptoms of steal.    Past Medical History  Diagnosis Date  . Hypertension   . Thyroid disease   . Morbid obesity   . Hyperlipidemia   . History of pneumonia   . Foot fracture   . Dilated cardiomyopathy   . CHF (congestive heart failure)     hosp. 2011  . Gout   . Anxiety     pt not aware of this  . Arthritis   . Anemia   . Menorrhagia   . Chronic kidney disease     not on dialysis at this time  . Encounter for blood transfusion   . Pneumonia     hosp. 2010  . History of blood transfusion   . Shortness of breath     seldom    Past Surgical History  Procedure Laterality Date  . Cardiac surgery  1979    repair of hole in heart  . Av fistula placement  08/04/2011    Procedure: ARTERIOVENOUS (AV) FISTULA CREATION;  Surgeon: Elam Dutch, MD;  Location: [facility];  Service: Vascular;  Laterality: Right;  Creation right brachiocephalic arteriovenous fistula  . Av fistula placement Left 02/05/2013    Procedure: ARTERIOVENOUS FISTULA CREATION LEFT ARM;  Surgeon: Conrad [place], MD;  Location: [place];  Service: Vascular;  Laterality: Left;  . Thrombectomy w/ embolectomy Left 03/05/2013    Procedure: THROMBECTOMY OF LEFT BRACHIAL ARTERY;  Surgeon: Conrad Trego, MD;  Location: [place];  Service: Vascular;  Laterality: Left;  . Ligation of arteriovenous  fistula Left 03/05/2013    Procedure: LIGATION OF ARTERIOVENOUS  FISTULA;  Surgeon: Conrad [place], MD;  Location: [place];  Service: Vascular;  Laterality: Left;  . Patch angioplasty Left 03/05/2013    Procedure: PATCH ANGIOPLASTY;  Surgeon: Conrad Topaz, MD;  Location: [place];  Service: Vascular;  Laterality: Left;  . Av fistula placement Right 06/04/2013    Procedure: ARTERIOVENOUS (AV) FISTULA CREATION;  Surgeon: Conrad , MD;   Location: [place];  Service: Vascular;  Laterality: Right;    History   Social History  . Marital Status: Single    Spouse Name: N/A    Number of Children: N/A  . Years of Education: N/A   Occupational History  . Not on file.   Social History Main Topics  . Smoking status: Never Smoker   . Smokeless tobacco: Never Used  . Alcohol Use: No  . Drug Use: No  . Sexual Activity: Yes    Birth Control/ Protection: None     Comment: no birth control    Other Topics Concern  . Not on file   Social History Narrative  . No narrative on file    Family History  Problem Relation Age of Onset  . Diabetes Mother   . Hyperlipidemia Mother   . Hypertension Mother   . Kidney disease Mother   . Anesthesia problems Neg Hx   . Hypotension Neg Hx   . Malignant hyperthermia Neg Hx   . Pseudochol deficiency Neg Hx     Current Outpatient Prescriptions on File Prior to Visit  Medication Sig Dispense Refill  . allopurinol (ZYLOPRIM) 100 MG tablet Take 200 mg by mouth daily.       Marland Kitchen  calcitRIOL (ROCALTROL) 0.25 MCG capsule Take 0.25 mcg by mouth 3 (three) times a week. Monday Wednesday and Friday      . Calcium Carb-Cholecalciferol (CALCIUM 500 +D PO) Take 3 tablets by mouth daily.      . carvedilol (COREG) 25 MG tablet Take 25 mg by mouth 2 (two) times daily with a meal.      . ferrous sulfate 325 (65 FE) MG EC tablet Take 1 tablet (325 mg total) by mouth 2 (two) times daily.  60 tablet  1  . furosemide (LASIX) 80 MG tablet Take 80 mg by mouth 2 (two) times daily.      . hydrALAZINE (APRESOLINE) 100 MG tablet       . hydrALAZINE (APRESOLINE) 25 MG tablet Take 100 mg by mouth 3 (three) times daily.       . megestrol (MEGACE) 20 MG tablet Take 1 tablet (20 mg total) by mouth 2 (two) times daily.  60 tablet  1  . oxyCODONE (OXY IR/ROXICODONE) 5 MG immediate release tablet Take 1 tablet (5 mg total) by mouth every 6 (six) hours as needed for severe pain.  30 tablet  0  . pravastatin (PRAVACHOL) 40  MG tablet        Current Facility-Administered Medications on File Prior to Visit  Medication Dose Route Frequency Provider Last Rate Last Dose  . 0.9 %  sodium chloride infusion   Intravenous Continuous Conrad [place], MD        No Known Allergies  REVIEW OF SYSTEMS:  (Positives checked otherwise negative)  CARDIOVASCULAR:  []  chest pain, []  chest pressure, []  palpitations, []  shortness of breath when laying flat, []  shortness of breath with exertion,  []  pain in feet when walking, []  pain in feet when laying flat, []  history of blood clot in veins (DVT), []  history of phlebitis, []  swelling in legs, []  varicose veins  PULMONARY:  []  productive cough, []  asthma, []  wheezing  NEUROLOGIC:  []  weakness in arms or legs, []  numbness in arms or legs, []  difficulty speaking or slurred speech, []  temporary loss of vision in one eye, []  dizziness  HEMATOLOGIC:  []  bleeding problems, []  problems with blood clotting too easily  MUSCULOSKEL:  []  joint pain, []  joint swelling  GASTROINTEST:  []  vomiting blood, []  blood in stool     GENITOURINARY:  []  burning with urination, []  blood in urine  PSYCHIATRIC:  []  history of major depression  INTEGUMENTARY:  []  rashes, []  ulcers  Physical Exam:  Filed Vitals:   08/03/13 1524  BP: 140/74  Pulse: 73   Pulmonary: Sym exp, good air movt, CTAB, no rales, rhonchi, & wheezing  Cardiac: RRR, Nl S1, S2, no Murmurs, rubs or gallops  RUE: Well healed, + palpable right radial pulse; strong right hand grip; +thrill within fistula  A/P:  This is a 38 y.o. female here for f/u to her 1st stage brachial vein transposition.  -pt is doing well and does not report any steal symptoms.  The brachial vein is of adequate size and it is deep.   -Dr. Bridgett Larsson discussed with pt the vein is adequate for 2nd stage procedure and she agrees to proceed.  -will schedule for 2nd stage right brachial vein transposition on 08/13/13   Leontine Locket, PA-C [facility] [phone]  Clinic MD:  Pt seen and examined with Dr. Bridgett Larsson  Addendum  I have independently interviewed and examined the patient, and I agree with the physician assistant's findings.  Pt is scheduled for her R 2nd stage BRVT.  Adele Barthel, MD [facility] [place] Office: [phone] Pager: [phone]  08/03/2013, 5:47 PM

## 2013-08-05 ENCOUNTER — Other Ambulatory Visit: Payer: Self-pay | Admitting: Obstetrics & Gynecology

## 2013-08-06 ENCOUNTER — Other Ambulatory Visit: Payer: Self-pay | Admitting: *Deleted

## 2013-08-06 ENCOUNTER — Encounter (HOSPITAL_COMMUNITY): Payer: Self-pay | Admitting: Pharmacy Technician

## 2013-08-10 ENCOUNTER — Encounter (HOSPITAL_COMMUNITY): Payer: Self-pay | Admitting: *Deleted

## 2013-08-12 MED ORDER — SODIUM CHLORIDE 0.9 % IV SOLN
INTRAVENOUS | Status: DC
Start: 2013-08-12 — End: 2013-08-13
  Administered 2013-08-13: 10 mL/h via INTRAVENOUS

## 2013-08-12 MED ORDER — CEFUROXIME SODIUM 1.5 G IJ SOLR
1.5000 g | INTRAMUSCULAR | Status: AC
  Administered 2013-08-13: 1.5 g via INTRAVENOUS
  Filled 2013-08-12: qty 1.5

## 2013-08-13 ENCOUNTER — Ambulatory Visit (HOSPITAL_COMMUNITY)
Admission: RE | Admit: 2013-08-13 | Discharge: 2013-08-13 | Disposition: A | Discharge status: Home / Self Care | Payer: BC Managed Care – PPO | Source: Ambulatory Visit | Attending: Vascular Surgery | Admitting: Vascular Surgery

## 2013-08-13 ENCOUNTER — Ambulatory Visit (HOSPITAL_COMMUNITY): Payer: BC Managed Care – PPO | Admitting: Anesthesiology

## 2013-08-13 ENCOUNTER — Encounter (HOSPITAL_COMMUNITY)
Admission: RE | Disposition: A | Discharge status: Home / Self Care | Payer: Self-pay | Source: Ambulatory Visit | Attending: Vascular Surgery

## 2013-08-13 ENCOUNTER — Encounter (HOSPITAL_COMMUNITY): Payer: BC Managed Care – PPO | Admitting: Anesthesiology

## 2013-08-13 ENCOUNTER — Encounter (HOSPITAL_COMMUNITY): Payer: Self-pay | Admitting: *Deleted

## 2013-08-13 DIAGNOSIS — Z992 Dependence on renal dialysis: Secondary | ICD-10-CM | POA: Diagnosis not present

## 2013-08-13 DIAGNOSIS — N184 Chronic kidney disease, stage 4 (severe): Secondary | ICD-10-CM | POA: Diagnosis not present

## 2013-08-13 DIAGNOSIS — E785 Hyperlipidemia, unspecified: Secondary | ICD-10-CM | POA: Insufficient documentation

## 2013-08-13 DIAGNOSIS — I129 Hypertensive chronic kidney disease with stage 1 through stage 4 chronic kidney disease, or unspecified chronic kidney disease: Secondary | ICD-10-CM | POA: Insufficient documentation

## 2013-08-13 DIAGNOSIS — E079 Disorder of thyroid, unspecified: Secondary | ICD-10-CM | POA: Insufficient documentation

## 2013-08-13 DIAGNOSIS — M109 Gout, unspecified: Secondary | ICD-10-CM | POA: Insufficient documentation

## 2013-08-13 DIAGNOSIS — N186 End stage renal disease: Secondary | ICD-10-CM

## 2013-08-13 DIAGNOSIS — Z79899 Other long term (current) drug therapy: Secondary | ICD-10-CM | POA: Insufficient documentation

## 2013-08-13 DIAGNOSIS — D649 Anemia, unspecified: Secondary | ICD-10-CM | POA: Diagnosis not present

## 2013-08-13 DIAGNOSIS — I509 Heart failure, unspecified: Secondary | ICD-10-CM | POA: Diagnosis not present

## 2013-08-13 HISTORY — PX: BASCILIC VEIN TRANSPOSITION: SHX5742

## 2013-08-13 LAB — POCT I-STAT 4, (NA,K, GLUC, HGB,HCT)
Glucose, Bld: 87 mg/dL (ref 70–99)
HEMATOCRIT: 32 % — AB (ref 36.0–46.0)
Hemoglobin: 10.9 g/dL — ABNORMAL LOW (ref 12.0–15.0)
Potassium: 3.8 mEq/L (ref 3.7–5.3)
SODIUM: 142 meq/L (ref 137–147)

## 2013-08-13 LAB — HCG, SERUM, QUALITATIVE: Preg, Serum: NEGATIVE

## 2013-08-13 SURGERY — TRANSPOSITION, VEIN, BASILIC
Anesthesia: General | Site: Arm Upper | Laterality: Right

## 2013-08-13 MED ORDER — THROMBIN 20000 UNITS EX SOLR
CUTANEOUS | Status: AC
  Filled 2013-08-13: qty 20000

## 2013-08-13 MED ORDER — PHENYLEPHRINE 40 MCG/ML (10ML) SYRINGE FOR IV PUSH (FOR BLOOD PRESSURE SUPPORT)
PREFILLED_SYRINGE | INTRAVENOUS | Status: AC
  Filled 2013-08-13: qty 20

## 2013-08-13 MED ORDER — PROMETHAZINE HCL 25 MG/ML IJ SOLN: 6.2500 mg | INTRAMUSCULAR | Status: DC | PRN

## 2013-08-13 MED ORDER — DEXAMETHASONE SODIUM PHOSPHATE 4 MG/ML IJ SOLN
INTRAMUSCULAR | Status: DC | PRN
Start: 2013-08-13 — End: 2013-08-13
  Administered 2013-08-13: 4 mg via INTRAVENOUS

## 2013-08-13 MED ORDER — THROMBIN 20000 UNITS EX SOLR
CUTANEOUS | Status: DC | PRN
  Administered 2013-08-13 (×2): via TOPICAL

## 2013-08-13 MED ORDER — ONDANSETRON HCL 4 MG/2ML IJ SOLN
INTRAMUSCULAR | Status: DC | PRN
  Administered 2013-08-13 (×2): 4 mg via INTRAVENOUS

## 2013-08-13 MED ORDER — FENTANYL CITRATE 0.05 MG/ML IJ SOLN
INTRAMUSCULAR | Status: DC | PRN
  Administered 2013-08-13 (×2): 25 ug via INTRAVENOUS
  Administered 2013-08-13 (×3): 50 ug via INTRAVENOUS
  Administered 2013-08-13 (×2): 25 ug via INTRAVENOUS
  Administered 2013-08-13 (×3): 50 ug via INTRAVENOUS

## 2013-08-13 MED ORDER — SCOPOLAMINE 1 MG/3DAYS TD PT72
MEDICATED_PATCH | TRANSDERMAL | Status: AC
  Filled 2013-08-13: qty 1

## 2013-08-13 MED ORDER — SODIUM CHLORIDE 0.9 % IR SOLN
Status: DC | PRN
  Administered 2013-08-13: 12:00:00

## 2013-08-13 MED ORDER — LIDOCAINE HCL (CARDIAC) 20 MG/ML IV SOLN
INTRAVENOUS | Status: DC | PRN
  Administered 2013-08-13: 80 mg via INTRAVENOUS
  Administered 2013-08-13: 20 mg via INTRAVENOUS

## 2013-08-13 MED ORDER — PROPOFOL 10 MG/ML IV BOLUS
INTRAVENOUS | Status: AC
  Filled 2013-08-13: qty 20

## 2013-08-13 MED ORDER — MIDAZOLAM HCL 2 MG/2ML IJ SOLN
INTRAMUSCULAR | Status: AC
  Filled 2013-08-13: qty 2

## 2013-08-13 MED ORDER — PHENYLEPHRINE HCL 10 MG/ML IJ SOLN
INTRAMUSCULAR | Status: DC | PRN
  Administered 2013-08-13: 40 ug via INTRAVENOUS
  Administered 2013-08-13: 80 ug via INTRAVENOUS

## 2013-08-13 MED ORDER — FENTANYL CITRATE 0.05 MG/ML IJ SOLN
25.0000 ug | INTRAMUSCULAR | Status: DC | PRN
  Administered 2013-08-13 (×4): 25 ug via INTRAVENOUS

## 2013-08-13 MED ORDER — FENTANYL CITRATE 0.05 MG/ML IJ SOLN
INTRAMUSCULAR | Status: AC
  Administered 2013-08-13: 25 ug via INTRAVENOUS
  Filled 2013-08-13: qty 2

## 2013-08-13 MED ORDER — 0.9 % SODIUM CHLORIDE (POUR BTL) OPTIME
TOPICAL | Status: DC | PRN
Start: 2013-08-13 — End: 2013-08-13
  Administered 2013-08-13: 1000 mL

## 2013-08-13 MED ORDER — OXYCODONE HCL 5 MG PO TABS
5.0000 mg | ORAL_TABLET | Freq: Once | ORAL | Status: AC | PRN
  Administered 2013-08-13: 5 mg via ORAL

## 2013-08-13 MED ORDER — LIDOCAINE-EPINEPHRINE (PF) 1 %-1:200000 IJ SOLN
INTRAMUSCULAR | Status: AC
  Filled 2013-08-13: qty 10

## 2013-08-13 MED ORDER — HEPARIN SODIUM (PORCINE) 1000 UNIT/ML IJ SOLN
INTRAMUSCULAR | Status: DC | PRN
  Administered 2013-08-13: 3000 [IU] via INTRAVENOUS

## 2013-08-13 MED ORDER — PROPOFOL 10 MG/ML IV BOLUS
INTRAVENOUS | Status: DC | PRN
  Administered 2013-08-13: 200 mg via INTRAVENOUS
  Administered 2013-08-13: 40 mg via INTRAVENOUS

## 2013-08-13 MED ORDER — OXYCODONE HCL 5 MG PO TABS
ORAL_TABLET | ORAL | Status: AC
  Filled 2013-08-13: qty 1

## 2013-08-13 MED ORDER — LIDOCAINE-EPINEPHRINE (PF) 1 %-1:200000 IJ SOLN
INTRAMUSCULAR | Status: DC | PRN
  Administered 2013-08-13: 30 mL

## 2013-08-13 MED ORDER — OXYCODONE HCL 10 MG PO TABS: 10.0000 mg | ORAL_TABLET | ORAL | Status: DC | PRN

## 2013-08-13 MED ORDER — SCOPOLAMINE 1 MG/3DAYS TD PT72
1.0000 | MEDICATED_PATCH | Freq: Once | TRANSDERMAL | Status: DC
  Administered 2013-08-13: 1.5 mg via TRANSDERMAL

## 2013-08-13 MED ORDER — LIDOCAINE HCL (CARDIAC) 20 MG/ML IV SOLN
INTRAVENOUS | Status: AC
  Filled 2013-08-13: qty 5

## 2013-08-13 MED ORDER — PHENYLEPHRINE HCL 10 MG/ML IJ SOLN
INTRAMUSCULAR | Status: AC
  Filled 2013-08-13: qty 1

## 2013-08-13 MED ORDER — MIDAZOLAM HCL 5 MG/5ML IJ SOLN
INTRAMUSCULAR | Status: DC | PRN
  Administered 2013-08-13 (×2): 1 mg via INTRAVENOUS

## 2013-08-13 MED ORDER — DEXTROSE 5 % IV SOLN
10.0000 mg | INTRAVENOUS | Status: DC | PRN
  Administered 2013-08-13: 25 ug/min via INTRAVENOUS

## 2013-08-13 MED ORDER — OXYCODONE HCL 5 MG PO TABS: 5.0000 mg | ORAL_TABLET | ORAL | Status: DC | PRN

## 2013-08-13 MED ORDER — MEPERIDINE HCL 25 MG/ML IJ SOLN: 6.2500 mg | INTRAMUSCULAR | Status: DC | PRN

## 2013-08-13 MED ORDER — FENTANYL CITRATE 0.05 MG/ML IJ SOLN
INTRAMUSCULAR | Status: AC
  Filled 2013-08-13: qty 5

## 2013-08-13 MED ORDER — THROMBIN 20000 UNITS EX SOLR
CUTANEOUS | Status: AC
Start: 2013-08-13 — End: 2013-08-13
  Filled 2013-08-13: qty 20000

## 2013-08-13 MED ORDER — OXYCODONE HCL 5 MG/5ML PO SOLN: 5.0000 mg | Freq: Once | ORAL | Status: AC | PRN

## 2013-08-13 SURGICAL SUPPLY — 55 items
ADH SKN CLS APL DERMABOND .7 (GAUZE/BANDAGES/DRESSINGS) ×1
BLADE 10 SAFETY STRL DISP (BLADE) ×2 IMPLANT
CANISTER SUCTION 2500CC (MISCELLANEOUS) ×2 IMPLANT
CLIP TI MEDIUM 24 (CLIP) ×2 IMPLANT
CLIP TI WIDE RED SMALL 24 (CLIP) ×2 IMPLANT
CORDS BIPOLAR (ELECTRODE) IMPLANT
COVER PROBE W GEL 5X96 (DRAPES) IMPLANT
COVER SURGICAL LIGHT HANDLE (MISCELLANEOUS) ×2 IMPLANT
DECANTER SPIKE VIAL GLASS SM (MISCELLANEOUS) ×2 IMPLANT
DERMABOND ADVANCED (GAUZE/BANDAGES/DRESSINGS) ×1
DERMABOND ADVANCED .7 DNX12 (GAUZE/BANDAGES/DRESSINGS) ×1 IMPLANT
DRAIN HEMOVAC 1/8 X 5 (WOUND CARE) ×1 IMPLANT
DRSG COVADERM 4X8 (GAUZE/BANDAGES/DRESSINGS) ×1 IMPLANT
ELECT REM PT RETURN 9FT ADLT (ELECTROSURGICAL) ×2
ELECTRODE REM PT RTRN 9FT ADLT (ELECTROSURGICAL) ×1 IMPLANT
EVACUATOR SILICONE 100CC (DRAIN) ×1 IMPLANT
GAUZE XEROFORM 1X8 LF (GAUZE/BANDAGES/DRESSINGS) ×1 IMPLANT
GLOVE BIO SURGEON STRL SZ7 (GLOVE) ×2 IMPLANT
GLOVE BIOGEL PI IND STRL 6.5 (GLOVE) IMPLANT
GLOVE BIOGEL PI IND STRL 7.0 (GLOVE) IMPLANT
GLOVE BIOGEL PI IND STRL 7.5 (GLOVE) ×1 IMPLANT
GLOVE BIOGEL PI INDICATOR 6.5 (GLOVE) ×1
GLOVE BIOGEL PI INDICATOR 7.0 (GLOVE) ×1
GLOVE BIOGEL PI INDICATOR 7.5 (GLOVE) ×1
GLOVE ECLIPSE 6.5 STRL STRAW (GLOVE) ×1 IMPLANT
GLOVE SKINSENSE NS SZ6.5 (GLOVE) ×1
GLOVE SKINSENSE STRL SZ6.5 (GLOVE) IMPLANT
GOWN BRE IMP SLV AUR XL STRL (GOWN DISPOSABLE) ×2 IMPLANT
GOWN STRL REUS W/ TWL LRG LVL3 (GOWN DISPOSABLE) ×3 IMPLANT
GOWN STRL REUS W/TWL LRG LVL3 (GOWN DISPOSABLE) ×6
KIT BASIN OR (CUSTOM PROCEDURE TRAY) ×2 IMPLANT
KIT ROOM TURNOVER OR (KITS) ×2 IMPLANT
LOOP VESSEL MAXI BLUE (MISCELLANEOUS) ×1 IMPLANT
NS IRRIG 1000ML POUR BTL (IV SOLUTION) ×2 IMPLANT
PACK CV ACCESS (CUSTOM PROCEDURE TRAY) ×2 IMPLANT
PAD ARMBOARD 7.5X6 YLW CONV (MISCELLANEOUS) ×4 IMPLANT
SPONGE INTESTINAL PEANUT (DISPOSABLE) ×1 IMPLANT
SPONGE SURGIFOAM ABS GEL 100 (HEMOSTASIS) IMPLANT
STAPLER VISISTAT 35W (STAPLE) ×1 IMPLANT
SUT ETHILON 3 0 PS 1 (SUTURE) ×1 IMPLANT
SUT MNCRL AB 4-0 PS2 18 (SUTURE) ×2 IMPLANT
SUT PROLENE 6 0 BV (SUTURE) ×3 IMPLANT
SUT PROLENE 7 0 BV 1 (SUTURE) IMPLANT
SUT SILK 2 0 SH (SUTURE) ×1 IMPLANT
SUT SILK 3 0 (SUTURE) ×2
SUT SILK 3 0 SH 30 (SUTURE) IMPLANT
SUT SILK 3-0 18XBRD TIE 12 (SUTURE) IMPLANT
SUT VIC AB 2-0 CT1 27 (SUTURE) ×2
SUT VIC AB 2-0 CT1 TAPERPNT 27 (SUTURE) ×1 IMPLANT
SUT VIC AB 3-0 SH 27 (SUTURE) ×14
SUT VIC AB 3-0 SH 27X BRD (SUTURE) ×2 IMPLANT
TOWEL OR 17X24 6PK STRL BLUE (TOWEL DISPOSABLE) ×2 IMPLANT
TOWEL OR 17X26 10 PK STRL BLUE (TOWEL DISPOSABLE) ×2 IMPLANT
UNDERPAD 30X30 INCONTINENT (UNDERPADS AND DIAPERS) ×2 IMPLANT
WATER STERILE IRR 1000ML POUR (IV SOLUTION) ×2 IMPLANT

## 2013-08-13 NOTE — Interval H&P Note (Signed)
History and Physical Interval Note:  08/13/2013 10:42 AM  [name]  has presented today for surgery, with the diagnosis of End stage renal disease  The various methods of treatment have been discussed with the patient and family. After consideration of risks, benefits and other options for treatment, the patient has consented to  Procedure(s): BASILIC VEIN TRANSPOSITION - Second Stage (Right) as a surgical intervention .  The patient's history has been reviewed, patient examined, no change in status, stable for surgery.  I have reviewed the patient's chart and labs.  Questions were answered to the patient's satisfaction.     Bracen Schum LIANG-YU

## 2013-08-13 NOTE — Anesthesia Preprocedure Evaluation (Addendum)
Anesthesia Evaluation  Patient identified by MRN, date of birth, ID band Patient awake    Reviewed: Allergy & Precautions, H&P , NPO status , Patient's Chart, lab work & pertinent test results, reviewed documented beta blocker date and time   History of Anesthesia Complications Negative for: history of anesthetic complications  Airway Mallampati: II TM Distance: >3 FB Neck ROM: Full    Dental  (+) Poor Dentition, Dental Advisory Given   Pulmonary shortness of breath and with exertion,  breath sounds clear to auscultation        Cardiovascular hypertension, Pt. on medications and Pt. on home beta blockers - anginaRhythm:Regular Rate:Normal  4/15 ECHO: EF [id number], grade 2 diastolic dysfunction, valves OK   Neuro/Psych negative neurological ROS     GI/Hepatic negative GI ROS, Neg liver ROS,   Endo/Other  Morbid obesity  Renal/GU CRFRenal disease (k+ 3.8, not on dialysis yet)     Musculoskeletal   Abdominal (+) + obese,   Peds  Hematology  (+) Blood dyscrasia (Hb 10.9), ,   Anesthesia Other Findings   Reproductive/Obstetrics 08/13/13 preg test NEG                        Anesthesia Physical Anesthesia Plan  ASA: III  Anesthesia Plan: General   Post-op Pain Management:    Induction: Intravenous  Airway Management Planned: LMA  Additional Equipment:   Intra-op Plan:   Post-operative Plan:   Informed Consent: I have reviewed the patients History and Physical, chart, labs and discussed the procedure including the risks, benefits and alternatives for the proposed anesthesia with the patient or authorized representative who has indicated his/her understanding and acceptance.   Dental advisory given  Plan Discussed with: Surgeon and CRNA  Anesthesia Plan Comments: (Plan routine monitors, GA- LMA OK)        Anesthesia Quick Evaluation

## 2013-08-13 NOTE — Op Note (Signed)
OPERATIVE NOTE   PROCEDURE: right second stage basilic vein transposition (brachiobasilic arteriovenous fistula) placement with anastomosis revision  PRE-OPERATIVE DIAGNOSIS: chronic kidney disease stage IV, successful right first stage brachial vein transposition    POST-OPERATIVE DIAGNOSIS: same as above   SURGEON: Adele Barthel, MD  ANESTHESIA: general  ESTIMATED BLOOD LOSS: 150 cc  FINDING(S): 1.  Fistula 2-5 cm deep in the arm 2.  Sclerotic distal fistula without any thrill, remaining fistula 6-8 mm in diameter 3.  High arterial bifurcation in distal upper arm 4.  Palpable thrill and dopplerable radial signal at the end of the case  SPECIMEN(S):  none  INDICATIONS:   [name] is a 38 y.o. female who presents with chronic kidney disease stage IV with successful right first stage brachial vein transposition.  The patient is scheduled for right second stage basilic vein transposition.  The patient is aware the risks include but are not limited to: bleeding, infection, steal syndrome, nerve damage, ischemic monomelic neuropathy, failure to mature, and need for additional procedures.  The patient is aware of the risks of the procedure and elects to proceed forward.  DESCRIPTION: After full informed written consent was obtained from the patient, the patient was brought back to the operating room and placed supine upon the operating table.  Prior to induction, the patient received IV antibiotics.   After obtaining adequate anesthesia, the patient was then prepped and draped in the standard fashion for a right arm access procedure.  I turned my attention first to identifying the patient's brachiobasilic arteriovenous fistula.  Using SonoSite guidance, the location of this fistula was marked out on the skin.   At this point, I injected local anesthetic to obtain a field block of the antecubitum.  In total, I injected about 30 mL of 1% lidocaine with epinephrine.  I made an  longitudinal incision over the fistula from its arterial anastomosis up to its axillary extent.  I carefully dissected the fistula away from its adjacent nerves.  Eventually the entirety of this fistula was mobilized.  There was no thrill in the fistula but there was a good signal.  I could also visualize bleed running through the fistula.  The distal fistula was noted to be sclerotic, so I felt revision of the anastomosis was going to be necessary.  I tied off the distal fistula and transected the fistula at a disease free segment.  The remaining fistula was 6-8 mm in diameter.  I clamped the brachial artery proximal and distally and made an arteriotomy proximal to the prior fistula.  I sewed the fistula back to the brachial artery with a running stitch of 6-0 Prolene.  I backbled both ends of this fistula: no thrombus was present.  The artery was unclamped and immediately there was a strong thrill in the fistula.   I dissected a plane in the subcutaneous tissue at ~6 mm deep with electrocautery.  The fistula was secured in its new location with loosely placed interrupted 3-0 Vicryl stitches tied to side branches and the subcutaneous tissue.  The deep subcutaneous tissue was inspected for bleeding.  Bleeding was controlled with electrocautery and placement of large pieces of thrombin and gelfoam.  I washed out the surgical site after waiting a few minutes, and there was no further bleeding.  I placed a 10 JP drain in the deep subcutaneous space and routed the drain out a stab incision distal to the incision.  The drain was necessary due to the severe  obesity in this patient.  The drain was secured with a 3-0 Nylon tied to the drain.  The fascia was reapproximated with interrupted stitches of 3-0 Vicryl to eliminate some of the deep space.  The superficial subcutaneous tissue was then reapproximated along the incision line with a running stitch of 3-0 Vicryl.  The skin was then reapproximated with staples due to  this patient's severe obesity.  The drain was placed under bulb suction.  The patient tolerated this procedure well.   COMPLICATIONS: none  CONDITION: stable  Adele Barthel, MD [facility] [place] Office: [phone] Pager: [phone]  08/13/2013, 2:54 PM

## 2013-08-13 NOTE — Progress Notes (Signed)
Pt reported she did not need more pain medication, faces scale 6/10, educated regarding pain control and possible pain when moving, pt up to chair, faces scale up to 10/10, medicated with IV Fentanyl, will cont to assess for pain

## 2013-08-13 NOTE — Discharge Instructions (Signed)

## 2013-08-13 NOTE — H&P (View-Only) (Signed)
POST OPERATIVE OFFICE NOTE  CC:  F/u for surgery  HPI:  This is a 38 y.o. female who is s/p 1st stage right brachial vein transposition on 06/04/13 by Dr. Bridgett Larsson.  She returns today and is doing well.  She denies any symptoms of steal.    Past Medical History  Diagnosis Date  . Hypertension   . Thyroid disease   . Morbid obesity   . Hyperlipidemia   . History of pneumonia   . Foot fracture   . Dilated cardiomyopathy   . CHF (congestive heart failure)     hosp. 2011  . Gout   . Anxiety     pt not aware of this  . Arthritis   . Anemia   . Menorrhagia   . Chronic kidney disease     not on dialysis at this time  . Encounter for blood transfusion   . Pneumonia     hosp. 2010  . History of blood transfusion   . Shortness of breath     seldom    Past Surgical History  Procedure Laterality Date  . Cardiac surgery  1979    repair of hole in heart  . Av fistula placement  08/04/2011    Procedure: ARTERIOVENOUS (AV) FISTULA CREATION;  Surgeon: Elam Dutch, MD;  Location: [facility];  Service: Vascular;  Laterality: Right;  Creation right brachiocephalic arteriovenous fistula  . Av fistula placement Left 02/05/2013    Procedure: ARTERIOVENOUS FISTULA CREATION LEFT ARM;  Surgeon: Conrad [place]iston, MD;  Location: [place];  Service: Vascular;  Laterality: Left;  . Thrombectomy w/ embolectomy Left 03/05/2013    Procedure: THROMBECTOMY OF LEFT BRACHIAL ARTERY;  Surgeon: Conrad [place], MD;  Location: [place];  Service: Vascular;  Laterality: Left;  . Ligation of arteriovenous  fistula Left 03/05/2013    Procedure: LIGATION OF ARTERIOVENOUS  FISTULA;  Surgeon: Conrad Warwick, MD;  Location: [place];  Service: Vascular;  Laterality: Left;  . Patch angioplasty Left 03/05/2013    Procedure: PATCH ANGIOPLASTY;  Surgeon: Conrad Stutsman, MD;  Location: [place];  Service: Vascular;  Laterality: Left;  . Av fistula placement Right 06/04/2013    Procedure: ARTERIOVENOUS (AV) FISTULA CREATION;  Surgeon: Conrad [place], MD;   Location: [place];  Service: Vascular;  Laterality: Right;    History   Social History  . Marital Status: Single    Spouse Name: N/A    Number of Children: N/A  . Years of Education: N/A   Occupational History  . Not on file.   Social History Main Topics  . Smoking status: Never Smoker   . Smokeless tobacco: Never Used  . Alcohol Use: No  . Drug Use: No  . Sexual Activity: Yes    Birth Control/ Protection: None     Comment: no birth control    Other Topics Concern  . Not on file   Social History Narrative  . No narrative on file    Family History  Problem Relation Age of Onset  . Diabetes Mother   . Hyperlipidemia Mother   . Hypertension Mother   . Kidney disease Mother   . Anesthesia problems Neg Hx   . Hypotension Neg Hx   . Malignant hyperthermia Neg Hx   . Pseudochol deficiency Neg Hx     Current Outpatient Prescriptions on File Prior to Visit  Medication Sig Dispense Refill  . allopurinol (ZYLOPRIM) 100 MG tablet Take 200 mg by mouth daily.       Marland Kitchen  calcitRIOL (ROCALTROL) 0.25 MCG capsule Take 0.25 mcg by mouth 3 (three) times a week. Monday Wednesday and Friday      . Calcium Carb-Cholecalciferol (CALCIUM 500 +D PO) Take 3 tablets by mouth daily.      . carvedilol (COREG) 25 MG tablet Take 25 mg by mouth 2 (two) times daily with a meal.      . ferrous sulfate 325 (65 FE) MG EC tablet Take 1 tablet (325 mg total) by mouth 2 (two) times daily.  60 tablet  1  . furosemide (LASIX) 80 MG tablet Take 80 mg by mouth 2 (two) times daily.      . hydrALAZINE (APRESOLINE) 100 MG tablet       . hydrALAZINE (APRESOLINE) 25 MG tablet Take 100 mg by mouth 3 (three) times daily.       . megestrol (MEGACE) 20 MG tablet Take 1 tablet (20 mg total) by mouth 2 (two) times daily.  60 tablet  1  . oxyCODONE (OXY IR/ROXICODONE) 5 MG immediate release tablet Take 1 tablet (5 mg total) by mouth every 6 (six) hours as needed for severe pain.  30 tablet  0  . pravastatin (PRAVACHOL) 40  MG tablet        Current Facility-Administered Medications on File Prior to Visit  Medication Dose Route Frequency Provider Last Rate Last Dose  . 0.9 %  sodium chloride infusion   Intravenous Continuous Conrad [place], MD        No Known Allergies  REVIEW OF SYSTEMS:  (Positives checked otherwise negative)  CARDIOVASCULAR:  []  chest pain, []  chest pressure, []  palpitations, []  shortness of breath when laying flat, []  shortness of breath with exertion,  []  pain in feet when walking, []  pain in feet when laying flat, []  history of blood clot in veins (DVT), []  history of phlebitis, []  swelling in legs, []  varicose veins  PULMONARY:  []  productive cough, []  asthma, []  wheezing  NEUROLOGIC:  []  weakness in arms or legs, []  numbness in arms or legs, []  difficulty speaking or slurred speech, []  temporary loss of vision in one eye, []  dizziness  HEMATOLOGIC:  []  bleeding problems, []  problems with blood clotting too easily  MUSCULOSKEL:  []  joint pain, []  joint swelling  GASTROINTEST:  []  vomiting blood, []  blood in stool     GENITOURINARY:  []  burning with urination, []  blood in urine  PSYCHIATRIC:  []  history of major depression  INTEGUMENTARY:  []  rashes, []  ulcers  Physical Exam:  Filed Vitals:   08/03/13 1524  BP: 140/74  Pulse: 73   Pulmonary: Sym exp, good air movt, CTAB, no rales, rhonchi, & wheezing  Cardiac: RRR, Nl S1, S2, no Murmurs, rubs or gallops  RUE: Well healed, + palpable right radial pulse; strong right hand grip; +thrill within fistula  A/P:  This is a 38 y.o. female here for f/u to her 1st stage brachial vein transposition.  -pt is doing well and does not report any steal symptoms.  The brachial vein is of adequate size and it is deep.   -Dr. Bridgett Larsson discussed with pt the vein is adequate for 2nd stage procedure and she agrees to proceed.  -will schedule for 2nd stage right brachial vein transposition on 08/13/13   Leontine Locket, PA-C [facility] [phone]  Clinic MD:  Pt seen and examined with Dr. Bridgett Larsson  Addendum  I have independently interviewed and examined the patient, and I agree with the physician assistant's findings.  Pt is scheduled for her R 2nd stage BRVT.  Adele Barthel, MD [facility] [place] Office: [phone] Pager: [phone]  08/03/2013, 5:47 PM

## 2013-08-13 NOTE — Anesthesia Postprocedure Evaluation (Signed)
  Anesthesia Post-op Note  Patient: [name]  Procedure(s) Performed: Procedure(s): Second Stage Brachial Vein Transposition (Right)  Patient Location: PACU  Anesthesia Type:General  Level of Consciousness: awake, alert , oriented and patient cooperative  Airway and Oxygen Therapy: Patient Spontanous Breathing  Post-op Pain: mild  Post-op Assessment: Post-op Vital signs reviewed, Patient's Cardiovascular Status Stable, Respiratory Function Stable, Patent Airway, No signs of Nausea or vomiting and Pain level controlled  Post-op Vital Signs: Reviewed and stable  Last Vitals:  Filed Vitals:   08/13/13 1652  BP:   Pulse: 81  Temp:   Resp: 15    Complications: No apparent anesthesia complications

## 2013-08-13 NOTE — Transfer of Care (Signed)
Immediate Anesthesia Transfer of Care Note  Patient: [name]  Procedure(s) Performed: Procedure(s): Second Stage Brachial Vein Transposition (Right)  Patient Location: PACU  Anesthesia Type:General  Level of Consciousness: awake, alert  and oriented  Airway & Oxygen Therapy: Patient Spontanous Breathing and Patient connected to nasal cannula oxygen  Post-op Assessment: Report given to PACU RN, Post -op Vital signs reviewed and stable and Patient moving all extremities  Post vital signs: Reviewed and stable  Complications: No apparent anesthesia complications

## 2013-08-13 NOTE — Anesthesia Procedure Notes (Signed)
Procedure Name: LMA Insertion Date/Time: 08/13/2013 11:47 AM Performed by: Scheryl Darter Pre-anesthesia Checklist: Patient identified, Emergency Drugs available, Suction available, Patient being monitored and Timeout performed Patient Re-evaluated:Patient Re-evaluated prior to inductionOxygen Delivery Method: Circle system utilized Preoxygenation: Pre-oxygenation with 100% oxygen Intubation Type: IV induction LMA: LMA inserted LMA Size: 5.0 Number of attempts: 1 Placement Confirmation: positive ETCO2 and breath sounds checked- equal and bilateral Tube secured with: Tape Dental Injury: Teeth and Oropharynx as per pre-operative assessment

## 2013-08-14 ENCOUNTER — Telehealth: Payer: Self-pay | Admitting: Vascular Surgery

## 2013-08-14 NOTE — Telephone Encounter (Signed)
Spoke with Suanne Marker to schedule appointments. [name], our RN has stated that she will remove the JP drain in office on Friday)

## 2013-08-14 NOTE — Telephone Encounter (Signed)
Message copied by Gena Fray on Tue Aug 14, 2013  1:15 PM ------      Message from: Peter Minium K      Created: Mon Aug 13, 2013  3:36 PM      Regarding: Schedule                   ----- Message -----         From: Conrad [place], MD         Sent: 08/13/2013   3:03 PM           To: [address]            [name]      [id number]      1975-06-08            PROCEDURE:      right second stage basilic vein transposition (brachiobasilic arteriovenous fistula) placement with anastomosis revision            Follow-up:       1.  Friday for JP removal      2.  4 weeks       ------

## 2013-08-16 ENCOUNTER — Encounter (HOSPITAL_COMMUNITY): Payer: Self-pay | Admitting: Vascular Surgery

## 2013-08-17 ENCOUNTER — Ambulatory Visit (INDEPENDENT_AMBULATORY_CARE_PROVIDER_SITE_OTHER): Payer: BC Managed Care – PPO | Admitting: *Deleted

## 2013-08-17 ENCOUNTER — Encounter: Payer: BC Managed Care – PPO | Admitting: *Deleted

## 2013-08-17 VITALS — BP 142/78 | HR 78 | Temp 98.1°F | Resp 18

## 2013-08-17 DIAGNOSIS — Z4889 Encounter for other specified surgical aftercare: Secondary | ICD-10-CM

## 2013-08-17 DIAGNOSIS — N186 End stage renal disease: Secondary | ICD-10-CM

## 2013-08-17 NOTE — Progress Notes (Signed)
[name] drain in right upper arm removed with no difficulty. Patient has been afebrile since surgery ( 08-13-13 2nd stage BVT with anastomosis revision) and has had little to no pain. She has been keeping daily log of drain amounts; 19 ml, 44 ml, 54 ml, 22 ml and 20 ml today. Patient was given instruction on proper wound cleansing and dressing application. She will call us if she has any fever, erythema, or purulent drainage. She has her appt time to see Dr. Bridgett [name] on 09-14-13. Patient voices understanding and agreement of this plan.

## 2013-08-17 NOTE — Progress Notes (Signed)
This encounter was created in error - please disregard.

## 2013-08-17 NOTE — Progress Notes (Deleted)
[name] drain in right upper arm removed with no difficulty. Patient has been afebrile since surgery ( 08-13-13 2nd stage BVT with anastomosis revision) and has had little to no pain. She has been keeping daily log of drain amounts; 19 ml, 44 ml, 54 ml, 22 ml and 20 ml today. Patient was given instruction on proper wound cleansing and dressing application. She will call us if she has any fever, erythema, or purulent drainage. She has her appt time to see Dr. Bridgett Larsson on 09-14-13. Patient voices understanding and agreement of this plan.

## 2013-09-13 ENCOUNTER — Encounter: Payer: Self-pay | Admitting: Vascular Surgery

## 2013-09-14 ENCOUNTER — Ambulatory Visit (INDEPENDENT_AMBULATORY_CARE_PROVIDER_SITE_OTHER): Payer: Self-pay | Admitting: Vascular Surgery

## 2013-09-14 ENCOUNTER — Encounter: Payer: Self-pay | Admitting: Vascular Surgery

## 2013-09-14 VITALS — BP 139/83 | HR 73 | Ht 69.0 in | Wt 337.0 lb

## 2013-09-14 DIAGNOSIS — N185 Chronic kidney disease, stage 5: Secondary | ICD-10-CM

## 2013-09-14 NOTE — Progress Notes (Signed)
    Postoperative Access Visit   History of Present Illness  ESMA KOOI is a 38 y.o. year old female who presents for postoperative follow-up for: R 2nd BVT with revision of anastomosis (Date: 08/13/13).  The patient's wounds are healed.  The patient notes no steal symptoms.  The patient is able to complete their activities of daily living.  The patient's current symptoms are: intermittent forearm numbness.  For VQI Use Only  PRE-ADM LIVING: Home  AMB STATUS: Ambulatory  Physical Examination Filed Vitals:   09/14/13 1024  BP: 139/83  Pulse: 73    RUE: Incision is healed, skin feels warm, hand grip is 5/5, sensation in digits is intact, alpable thrill, bruit can be auscultated , radial pulse palpable  Medical Decision Making  DOLL MISE is a 38 y.o. year old female who presents s/p R 2nd Stage BVT with anastomosis revision.  Pt essentially had a BRVT due to short basilic vein.  Pt sx are not consistent with steal.  She has an easily palpable radial pulse.  The patient's access is ready for use.  Thank you for allowing [place] to participate in this patient's care.  Adele Barthel, MD [facility] Office: [phone] Pager: [phone]  09/14/2013, 11:02 AM

## 2013-10-19 ENCOUNTER — Other Ambulatory Visit: Payer: Self-pay

## 2013-11-10 ENCOUNTER — Encounter (HOSPITAL_COMMUNITY): Payer: Self-pay | Admitting: Emergency Medicine

## 2013-11-10 ENCOUNTER — Emergency Department (HOSPITAL_COMMUNITY)
Admission: EM | Admit: 2013-11-10 | Discharge: 2013-11-10 | Disposition: A | Discharge status: Home / Self Care | Payer: BC Managed Care – PPO | Attending: Emergency Medicine | Admitting: Emergency Medicine

## 2013-11-10 DIAGNOSIS — I509 Heart failure, unspecified: Secondary | ICD-10-CM | POA: Diagnosis not present

## 2013-11-10 DIAGNOSIS — Z8701 Personal history of pneumonia (recurrent): Secondary | ICD-10-CM | POA: Insufficient documentation

## 2013-11-10 DIAGNOSIS — Z8659 Personal history of other mental and behavioral disorders: Secondary | ICD-10-CM | POA: Insufficient documentation

## 2013-11-10 DIAGNOSIS — Z79899 Other long term (current) drug therapy: Secondary | ICD-10-CM | POA: Insufficient documentation

## 2013-11-10 DIAGNOSIS — Z8781 Personal history of (healed) traumatic fracture: Secondary | ICD-10-CM | POA: Insufficient documentation

## 2013-11-10 DIAGNOSIS — I129 Hypertensive chronic kidney disease with stage 1 through stage 4 chronic kidney disease, or unspecified chronic kidney disease: Secondary | ICD-10-CM | POA: Insufficient documentation

## 2013-11-10 DIAGNOSIS — E785 Hyperlipidemia, unspecified: Secondary | ICD-10-CM | POA: Insufficient documentation

## 2013-11-10 DIAGNOSIS — M109 Gout, unspecified: Secondary | ICD-10-CM | POA: Insufficient documentation

## 2013-11-10 DIAGNOSIS — N189 Chronic kidney disease, unspecified: Secondary | ICD-10-CM | POA: Insufficient documentation

## 2013-11-10 DIAGNOSIS — K047 Periapical abscess without sinus: Secondary | ICD-10-CM | POA: Insufficient documentation

## 2013-11-10 DIAGNOSIS — M1990 Unspecified osteoarthritis, unspecified site: Secondary | ICD-10-CM | POA: Diagnosis not present

## 2013-11-10 DIAGNOSIS — D649 Anemia, unspecified: Secondary | ICD-10-CM | POA: Insufficient documentation

## 2013-11-10 DIAGNOSIS — K088 Other specified disorders of teeth and supporting structures: Secondary | ICD-10-CM | POA: Diagnosis present

## 2013-11-10 MED ORDER — PENICILLIN V POTASSIUM 500 MG PO TABS: 500.0000 mg | ORAL_TABLET | Freq: Four times a day (QID) | ORAL | Status: AC

## 2013-11-10 MED ORDER — OXYCODONE-ACETAMINOPHEN 5-325 MG PO TABS
2.0000 | ORAL_TABLET | Freq: Once | ORAL | Status: AC
  Administered 2013-11-10: 2 via ORAL
  Filled 2013-11-10: qty 2

## 2013-11-10 MED ORDER — HYDROCODONE-ACETAMINOPHEN 5-325 MG PO TABS: 2.0000 | ORAL_TABLET | ORAL | Status: DC | PRN

## 2013-11-10 MED ORDER — PENICILLIN V POTASSIUM 250 MG PO TABS
500.0000 mg | ORAL_TABLET | Freq: Once | ORAL | Status: AC
  Administered 2013-11-10: 500 mg via ORAL
  Filled 2013-11-10: qty 2

## 2013-11-10 NOTE — Discharge Instructions (Signed)
Return to the emergency room with worsening of symptoms, new symptoms or with symptoms that are concerning, especially, fevers, difficulty opening your mouth, difficulty swallowing, drooling, muffled voice, neck stiffness. Please take all of your antibiotics until finished!   You may develop abdominal discomfort or diarrhea from the antibiotic.  You may help offset this with probiotics which you can buy or get in yogurt. Do not eat  or take the probiotics until 2 hours after your antibiotic.  Narcotics for severe pain. Do not operate machinery, drive or drink alcohol while taking narcotics or muscle relaxers. Follow up with your dentist in 2 days as scheduled.    Dental Abscess A dental abscess is a collection of infected fluid (pus) from a bacterial infection in the inner part of the tooth (pulp). It usually occurs at the end of the tooth's root.  CAUSES   Severe tooth decay.  Trauma to the tooth that allows bacteria to enter into the pulp, such as a broken or chipped tooth. SYMPTOMS   Severe pain in and around the infected tooth.  Swelling and redness around the abscessed tooth or in the mouth or face.  Tenderness.  Pus drainage.  Bad breath.  Bitter taste in the mouth.  Difficulty swallowing.  Difficulty opening the mouth.  Nausea.  Vomiting.  Chills.  Swollen neck glands. DIAGNOSIS   A medical and dental history will be taken.  An examination will be performed by tapping on the abscessed tooth.  X-rays may be taken of the tooth to identify the abscess. TREATMENT The goal of treatment is to eliminate the infection. You may be prescribed antibiotic medicine to stop the infection from spreading. A root canal may be performed to save the tooth. If the tooth cannot be saved, it may be pulled (extracted) and the abscess may be drained.  HOME CARE INSTRUCTIONS  Only take over-the-counter or prescription medicines for pain, fever, or discomfort as directed by your  caregiver.  Rinse your mouth (gargle) often with salt water ( tsp salt in 8 oz [250 ml] of warm water) to relieve pain or swelling.  Do not drive after taking pain medicine (narcotics).  Do not apply heat to the outside of your face.  Return to your dentist for further treatment as directed. SEEK MEDICAL CARE IF:  Your pain is not helped by medicine.  Your pain is getting worse instead of better. SEEK IMMEDIATE MEDICAL CARE IF:  You have a fever or persistent symptoms for more than 2-3 days.  You have a fever and your symptoms suddenly get worse.  You have chills or a very bad headache.  You have problems breathing or swallowing.  You have trouble opening your mouth.  You have swelling in the neck or around the eye. Document Released: 12/21/2004 Document Revised: 09/15/2011 Document Reviewed: 03/31/2010 [facility] Patient Information 2015 [place], [place]. This information is not intended to replace advice given to you by your health care provider. Make sure you discuss any questions you have with your health care provider.

## 2013-11-10 NOTE — ED Notes (Signed)
Pt. reports right lower molar pain onset 2 days ago unrelieved by OTC Oragel .

## 2013-11-10 NOTE — ED Provider Notes (Signed)
CSN: [id number]     Arrival date & time 11/10/13  0029 History   First MD Initiated Contact with Patient 11/10/13 0036     Chief Complaint  Patient presents with  . Dental Pain     (Consider location/radiation/quality/duration/timing/severity/associated sxs/prior Treatment) HPI  Abigail RAYMON is a 38 y.o. female with PMH of morbid obesity, CHF, HTN, hyperlipidemia, CKD presenting with right lower dental pain that started 4 days ago and it getting worse. Pain described as a sharp pain. Pt taken OTC oragel without relief. Pt without prior history of dental abscess. Pt with appointment with dentist in 2 days. Pt denies fevers, chills, nausea, vomiting. NO history of DM. No recent illness.   Past Medical History  Diagnosis Date  . Hypertension   . Thyroid disease   . Morbid obesity   . Hyperlipidemia   . History of pneumonia   . Foot fracture   . Dilated cardiomyopathy   . CHF (congestive heart failure)     hosp. 2011  . Gout   . Anxiety     pt not aware of this  . Arthritis   . Anemia   . Menorrhagia   . Chronic kidney disease     not on dialysis at this time  . Encounter for blood transfusion   . History of blood transfusion   . Shortness of breath     seldom  . Pneumonia     hosp. 2010   Past Surgical History  Procedure Laterality Date  . Cardiac surgery  1979    repair of hole in heart  . Av fistula placement  08/04/2011    Procedure: ARTERIOVENOUS (AV) FISTULA CREATION;  Surgeon: Elam Dutch, MD;  Location: [facility];  Service: Vascular;  Laterality: Right;  Creation right brachiocephalic arteriovenous fistula  . Av fistula placement Left 02/05/2013    Procedure: ARTERIOVENOUS FISTULA CREATION LEFT ARM;  Surgeon: Conrad Strathcona, MD;  Location: [place];  Service: Vascular;  Laterality: Left;  . Thrombectomy w/ embolectomy Left 03/05/2013    Procedure: THROMBECTOMY OF LEFT BRACHIAL ARTERY;  Surgeon: Conrad Halfway, MD;  Location: [place];  Service: Vascular;  Laterality: Left;   . Ligation of arteriovenous  fistula Left 03/05/2013    Procedure: LIGATION OF ARTERIOVENOUS  FISTULA;  Surgeon: Conrad Clintondale, MD;  Location: [place];  Service: Vascular;  Laterality: Left;  . Patch angioplasty Left 03/05/2013    Procedure: PATCH ANGIOPLASTY;  Surgeon: Conrad Doddsville, MD;  Location: [place];  Service: Vascular;  Laterality: Left;  . Av fistula placement Right 06/04/2013    Procedure: ARTERIOVENOUS (AV) FISTULA CREATION;  Surgeon: Conrad White Oak, MD;  Location: [place];  Service: Vascular;  Laterality: Right;  . Bascilic vein transposition Right 08/13/2013    Procedure: Second Stage Brachial Vein Transposition;  Surgeon: Conrad Langhorne Manor, MD;  Location: [place];  Service: Vascular;  Laterality: Right;   Family History  Problem Relation Age of Onset  . Diabetes Mother   . Hyperlipidemia Mother   . Hypertension Mother   . Kidney disease Mother   . Anesthesia problems Neg Hx   . Hypotension Neg Hx   . Malignant hyperthermia Neg Hx   . Pseudochol deficiency Neg Hx    History  Substance Use Topics  . Smoking status: Never Smoker   . Smokeless tobacco: Never Used  . Alcohol Use: No   OB History    No data available     Review of Systems  Constitutional: Negative for fever and chills.  HENT: Negative for congestion and rhinorrhea.   Eyes: Negative for visual disturbance.  Respiratory: Negative for cough and shortness of breath.   Cardiovascular: Negative for chest pain and palpitations.  Gastrointestinal: Negative for nausea, vomiting and diarrhea.  Musculoskeletal: Negative for back pain and gait problem.  Skin: Negative for rash.  Neurological: Negative for weakness and headaches.      Allergies  Review of patient's allergies indicates no known allergies.  Home Medications   Prior to Admission medications   Medication Sig Start Date End Date Taking? Authorizing Provider  allopurinol (ZYLOPRIM) 100 MG tablet Take 100 mg by mouth 2 (two) times daily.     Historical Provider, MD   calcitRIOL (ROCALTROL) 0.25 MCG capsule Take 0.25 mcg by mouth 3 (three) times a week. Monday Wednesday and Friday    Historical Provider, MD  Calcium Carb-Cholecalciferol (CALCIUM 500 +D PO) Take 3 tablets by mouth daily.    Historical Provider, MD  carvedilol (COREG) 25 MG tablet Take 25 mg by mouth 2 (two) times daily with a meal.    Historical Provider, MD  ferrous sulfate 325 (65 FE) MG EC tablet Take 1 tablet (325 mg total) by mouth 2 (two) times daily. 04/25/13   Emily Filbert, MD  furosemide (LASIX) 80 MG tablet Take 80 mg by mouth 2 (two) times daily.    Historical Provider, MD  hydrALAZINE (APRESOLINE) 100 MG tablet Take 100 mg by mouth 3 (three) times daily.  05/30/13   Historical Provider, MD  HYDROcodone-acetaminophen (NORCO) 5-325 MG per tablet Take 2 tablets by mouth every 4 (four) hours as needed. 11/10/13   Pura Spice, PA-C  megestrol (MEGACE) 20 MG tablet TAKE ONE TABLET BY MOUTH TWICE DAILY 09/01/13   Emily Filbert, MD  Oxycodone HCl (ROXICODONE) 10 MG TABS Take 1 tablet (10 mg total) by mouth every 4 (four) hours as needed for severe pain. 08/13/13   Conrad Stratford, MD  penicillin v potassium (VEETID) 500 MG tablet Take 1 tablet (500 mg total) by mouth 4 (four) times daily. 11/10/13 11/17/13  Pura Spice, PA-C  pravastatin (PRAVACHOL) 40 MG tablet Take 40 mg by mouth daily.  06/01/13   Historical Provider, MD  RENVELA 800 MG tablet Take 1,600 mg by mouth 3 (three) times daily with meals.  09/04/13   Historical Provider, MD   BP 128/63 mmHg  Pulse 99  Temp(Src) 98.9 F (37.2 C) (Oral)  Resp 18  SpO2 97% Physical Exam  Constitutional: She appears well-developed and well-nourished. No distress.  HENT:  Head: Normocephalic and atraumatic.  Mouth/Throat: Oropharynx is clear and moist.  Pt with abscess to gum line to right lower tooth. Mild erythematous gums. No trismus. No neck masses. Pt with tender cervical lymphadenopathy on right. Mild/minimal swelling of right face over  mandible. No tenderness over tongue. No muffled voice, drooling. FROM of neck without tenderness.  Eyes: Conjunctivae and EOM are normal. Right eye exhibits no discharge. Left eye exhibits no discharge.  Cardiovascular: Normal rate, regular rhythm and normal heart sounds.   Pulmonary/Chest: Effort normal and breath sounds normal. No respiratory distress. She has no wheezes.  Abdominal: Soft. Bowel sounds are normal. She exhibits no distension. There is no tenderness.  Neurological: She is alert. She exhibits normal muscle tone. Coordination normal.  Skin: Skin is warm and dry. She is not diaphoretic.  Nursing note and vitals reviewed.   ED Course  Procedures (including critical care time)  Labs Review Labs Reviewed - No data to display  Imaging Review No results found.   EKG Interpretation None      INCISION AND DRAINAGE Performed by: Pura Spice Consent: Verbal consent obtained. Risks and benefits: risks, benefits and alternatives were discussed Type: abscess  Body area: right lower gum line corresponding to molar  Anesthesia: local infiltration  Incision was made with a 18g needle.  Local anesthetic: lidocaine 2% with epinephrine  Anesthetic total: none  Complexity: complex  Drainage: purulent  Drainage amount: less than 1 cc  Packing material: none  Patient tolerance: Patient tolerated the procedure well with no immediate complications.    Meds given in ED:  Medications  oxyCODONE-acetaminophen (PERCOCET/ROXICET) 5-325 MG per tablet 2 tablet (2 tablets Oral Given 11/10/13 0135)  penicillin v potassium (VEETID) tablet 500 mg (500 mg Oral Given 11/10/13 0138)    New Prescriptions   HYDROCODONE-ACETAMINOPHEN (NORCO) 5-325 MG PER TABLET    Take 2 tablets by mouth every 4 (four) hours as needed.   PENICILLIN V POTASSIUM (VEETID) 500 MG TABLET    Take 1 tablet (500 mg total) by mouth 4 (four) times daily.      MDM   Final diagnoses:  Dental abscess    Patient with dental abscess with abscess amenable to incision and drainage. I&D well tolerated by patient without immediate complications. Exam unconcerning for Ludwig's angina or spread of infection.  Will treat with penicillin and pain medicine.  Pt to follow up with dentist in 2 days as scheduled.   Discussed return precautions with patient. Discussed all results and patient verbalizes understanding and agrees with plan.      Pura Spice, PA-C 11/10/13 0157  Orpah Greek, MD 11/10/13 1910

## 2013-11-26 ENCOUNTER — Other Ambulatory Visit: Payer: Self-pay | Admitting: Obstetrics & Gynecology

## 2013-12-03 ENCOUNTER — Encounter (HOSPITAL_COMMUNITY)
Admission: RE | Admit: 2013-12-03 | Discharge: 2013-12-03 | Disposition: A | Discharge status: Home / Self Care | Payer: BC Managed Care – PPO | Source: Ambulatory Visit | Attending: Nephrology | Admitting: Nephrology

## 2013-12-03 DIAGNOSIS — N185 Chronic kidney disease, stage 5: Secondary | ICD-10-CM | POA: Insufficient documentation

## 2013-12-03 DIAGNOSIS — D631 Anemia in chronic kidney disease: Secondary | ICD-10-CM | POA: Insufficient documentation

## 2013-12-03 LAB — POCT HEMOGLOBIN-HEMACUE: Hemoglobin: 9.4 g/dL — ABNORMAL LOW (ref 12.0–15.0)

## 2013-12-03 MED ORDER — DARBEPOETIN ALFA 100 MCG/0.5ML IJ SOSY
100.0000 ug | PREFILLED_SYRINGE | INTRAMUSCULAR | Status: DC
  Administered 2013-12-03: 100 ug via SUBCUTANEOUS

## 2013-12-03 MED ORDER — DARBEPOETIN ALFA 100 MCG/0.5ML IJ SOSY
PREFILLED_SYRINGE | INTRAMUSCULAR | Status: AC
  Filled 2013-12-03: qty 0.5

## 2013-12-03 NOTE — Discharge Instructions (Signed)
Darbepoetin Alfa injection What is this medicine? DARBEPOETIN ALFA (dar be POE e tin AL fa) helps your body make more red blood cells. It is used to treat anemia caused by chronic kidney failure and chemotherapy. This medicine may be used for other purposes; ask your health care provider or pharmacist if you have questions. COMMON BRAND NAME(S): Aranesp What should I tell my health care provider before I take this medicine? They need to know if you have any of these conditions: -blood clotting disorders or history of blood clots -cancer patient not on chemotherapy -cystic fibrosis -heart disease, such as angina, heart failure, or a history of a heart attack -hemoglobin level of 12 g/dL or greater -high blood pressure -low levels of folate, iron, or vitamin B12 -seizures -an unusual or allergic reaction to darbepoetin, erythropoietin, albumin, hamster proteins, latex, other medicines, foods, dyes, or preservatives -pregnant or trying to get pregnant -breast-feeding How should I use this medicine? This medicine is for injection into a vein or under the skin. It is usually given by a health care professional in a hospital or clinic setting. If you get this medicine at home, you will be taught how to prepare and give this medicine. Do not shake the solution before you withdraw a dose. Use exactly as directed. Take your medicine at regular intervals. Do not take your medicine more often than directed. It is important that you put your used needles and syringes in a special sharps container. Do not put them in a trash can. If you do not have a sharps container, call your pharmacist or healthcare provider to get one. Talk to your pediatrician regarding the use of this medicine in children. While this medicine may be used in children as young as 1 year for selected conditions, precautions do apply. Overdosage: If you think you have taken too much of this medicine contact a poison control center or  emergency room at once. NOTE: This medicine is only for you. Do not share this medicine with others. What if I miss a dose? If you miss a dose, take it as soon as you can. If it is almost time for your next dose, take only that dose. Do not take double or extra doses. What may interact with this medicine? Do not take this medicine with any of the following medications: -epoetin alfa This list may not describe all possible interactions. Give your health care provider a list of all the medicines, herbs, non-prescription drugs, or dietary supplements you use. Also tell them if you smoke, drink alcohol, or use illegal drugs. Some items may interact with your medicine. What should I watch for while using this medicine? Visit your prescriber or health care professional for regular checks on your progress and for the needed blood tests and blood pressure measurements. It is especially important for the doctor to make sure your hemoglobin level is in the desired range, to limit the risk of potential side effects and to give you the best benefit. Keep all appointments for any recommended tests. Check your blood pressure as directed. Ask your doctor what your blood pressure should be and when you should contact him or her. As your body makes more red blood cells, you may need to take iron, folic acid, or vitamin B supplements. Ask your doctor or health care provider which products are right for you. If you have kidney disease continue dietary restrictions, even though this medication can make you feel better. Talk with your doctor or health   care professional about the foods you eat and the vitamins that you take. What side effects may I notice from receiving this medicine? Side effects that you should report to your doctor or health care professional as soon as possible: -allergic reactions like skin rash, itching or hives, swelling of the face, lips, or tongue -breathing problems -changes in vision -chest  pain -confusion, trouble speaking or understanding -feeling faint or lightheaded, falls -high blood pressure -muscle aches or pains -pain, swelling, warmth in the leg -rapid weight gain -severe headaches -sudden numbness or weakness of the face, arm or leg -trouble walking, dizziness, loss of balance or coordination -seizures (convulsions) -swelling of the ankles, feet, hands -unusually weak or tired Side effects that usually do not require medical attention (report to your doctor or health care professional if they continue or are bothersome): -diarrhea -fever, chills (flu-like symptoms) -headaches -nausea, vomiting -redness, stinging, or swelling at site where injected This list may not describe all possible side effects. Call your doctor for medical advice about side effects. You may report side effects to FDA at 1-800-FDA-1088. Where should I keep my medicine? Keep out of the reach of children. Store in a refrigerator between 2 and 8 degrees C (36 and 46 degrees F). Do not freeze. Do not shake. Throw away any unused portion if using a single-dose vial. Throw away any unused medicine after the expiration date. NOTE: This sheet is a summary. It may not cover all possible information. If you have questions about this medicine, talk to your doctor, pharmacist, or health care provider.  2015, [employer]. (2007-12-05 10:23:57)  

## 2013-12-31 ENCOUNTER — Inpatient Hospital Stay (HOSPITAL_COMMUNITY): Admission: RE | Admit: 2013-12-31 | Payer: BC Managed Care – PPO | Source: Ambulatory Visit

## 2014-01-03 ENCOUNTER — Encounter (HOSPITAL_COMMUNITY)
Admission: RE | Admit: 2014-01-03 | Discharge: 2014-01-03 | Disposition: A | Discharge status: Home / Self Care | Payer: BC Managed Care – PPO | Source: Ambulatory Visit | Attending: Nephrology | Admitting: Nephrology

## 2014-01-03 DIAGNOSIS — N185 Chronic kidney disease, stage 5: Secondary | ICD-10-CM | POA: Diagnosis not present

## 2014-01-03 DIAGNOSIS — D631 Anemia in chronic kidney disease: Secondary | ICD-10-CM | POA: Insufficient documentation

## 2014-01-03 LAB — IRON AND TIBC
Iron: 49 ug/dL (ref 42–145)
Saturation Ratios: 19 % — ABNORMAL LOW (ref 20–55)
TIBC: 261 ug/dL (ref 250–470)
UIBC: 212 ug/dL (ref 125–400)

## 2014-01-03 LAB — FERRITIN: Ferritin: 205 ng/mL (ref 10–291)

## 2014-01-03 LAB — POCT HEMOGLOBIN-HEMACUE: Hemoglobin: 10.2 g/dL — ABNORMAL LOW (ref 12.0–15.0)

## 2014-01-03 MED ORDER — DARBEPOETIN ALFA 100 MCG/0.5ML IJ SOSY
100.0000 ug | PREFILLED_SYRINGE | INTRAMUSCULAR | Status: DC
  Administered 2014-01-03: 100 ug via SUBCUTANEOUS

## 2014-01-03 MED ORDER — DARBEPOETIN ALFA 100 MCG/0.5ML IJ SOSY
PREFILLED_SYRINGE | INTRAMUSCULAR | Status: AC
  Filled 2014-01-03: qty 0.5

## 2014-01-28 ENCOUNTER — Telehealth: Payer: Self-pay | Admitting: *Deleted

## 2014-01-28 NOTE — Telephone Encounter (Signed)
Pt contacted the clinic requesting a refill on Megace medication for bleeding.   Contacted patient and informed her that she needs to call and schedule an appointment with Dr. Hulan Fray for evaluation of concerns with bleeding.  Pt verbalizes understanding.

## 2014-01-30 ENCOUNTER — Other Ambulatory Visit (HOSPITAL_COMMUNITY): Payer: Self-pay | Admitting: *Deleted

## 2014-01-31 ENCOUNTER — Encounter (HOSPITAL_COMMUNITY)
Admission: RE | Admit: 2014-01-31 | Discharge: 2014-01-31 | Disposition: A | Discharge status: Home / Self Care | Payer: BC Managed Care – PPO | Source: Ambulatory Visit | Attending: Nephrology | Admitting: Nephrology

## 2014-01-31 DIAGNOSIS — N185 Chronic kidney disease, stage 5: Secondary | ICD-10-CM | POA: Insufficient documentation

## 2014-01-31 DIAGNOSIS — D631 Anemia in chronic kidney disease: Secondary | ICD-10-CM | POA: Insufficient documentation

## 2014-01-31 LAB — FERRITIN: Ferritin: 196 ng/mL (ref 10–291)

## 2014-01-31 LAB — IRON AND TIBC
Iron: 54 ug/dL (ref 42–145)
Saturation Ratios: 21 % (ref 20–55)
TIBC: 261 ug/dL (ref 250–470)
UIBC: 207 ug/dL (ref 125–400)

## 2014-01-31 MED ORDER — SODIUM CHLORIDE 0.9 % IV SOLN
510.0000 mg | Freq: Once | INTRAVENOUS | Status: AC
  Administered 2014-01-31: 510 mg via INTRAVENOUS
  Filled 2014-01-31: qty 17

## 2014-01-31 MED ORDER — DARBEPOETIN ALFA 100 MCG/0.5ML IJ SOSY
PREFILLED_SYRINGE | INTRAMUSCULAR | Status: AC
  Filled 2014-01-31: qty 0.5

## 2014-01-31 MED ORDER — DARBEPOETIN ALFA 100 MCG/0.5ML IJ SOSY
100.0000 ug | PREFILLED_SYRINGE | INTRAMUSCULAR | Status: DC
  Administered 2014-01-31: 100 ug via SUBCUTANEOUS

## 2014-02-01 LAB — POCT HEMOGLOBIN-HEMACUE: Hemoglobin: 10.3 g/dL — ABNORMAL LOW (ref 12.0–15.0)

## 2014-02-20 ENCOUNTER — Encounter (HOSPITAL_COMMUNITY): Payer: Self-pay | Admitting: *Deleted

## 2014-02-20 ENCOUNTER — Emergency Department (HOSPITAL_COMMUNITY)
Admission: EM | Admit: 2014-02-20 | Discharge: 2014-02-20 | Disposition: A | Discharge status: Home / Self Care | Payer: BLUE CROSS/BLUE SHIELD | Attending: Emergency Medicine | Admitting: Emergency Medicine

## 2014-02-20 DIAGNOSIS — Z9889 Other specified postprocedural states: Secondary | ICD-10-CM | POA: Diagnosis not present

## 2014-02-20 DIAGNOSIS — D649 Anemia, unspecified: Secondary | ICD-10-CM | POA: Insufficient documentation

## 2014-02-20 DIAGNOSIS — I509 Heart failure, unspecified: Secondary | ICD-10-CM | POA: Insufficient documentation

## 2014-02-20 DIAGNOSIS — N924 Excessive bleeding in the premenopausal period: Secondary | ICD-10-CM | POA: Insufficient documentation

## 2014-02-20 DIAGNOSIS — N939 Abnormal uterine and vaginal bleeding, unspecified: Secondary | ICD-10-CM | POA: Diagnosis present

## 2014-02-20 DIAGNOSIS — M109 Gout, unspecified: Secondary | ICD-10-CM | POA: Diagnosis not present

## 2014-02-20 DIAGNOSIS — F419 Anxiety disorder, unspecified: Secondary | ICD-10-CM | POA: Insufficient documentation

## 2014-02-20 DIAGNOSIS — E785 Hyperlipidemia, unspecified: Secondary | ICD-10-CM | POA: Insufficient documentation

## 2014-02-20 DIAGNOSIS — M1990 Unspecified osteoarthritis, unspecified site: Secondary | ICD-10-CM | POA: Diagnosis not present

## 2014-02-20 DIAGNOSIS — I120 Hypertensive chronic kidney disease with stage 5 chronic kidney disease or end stage renal disease: Secondary | ICD-10-CM | POA: Diagnosis not present

## 2014-02-20 DIAGNOSIS — Z8701 Personal history of pneumonia (recurrent): Secondary | ICD-10-CM | POA: Insufficient documentation

## 2014-02-20 DIAGNOSIS — N185 Chronic kidney disease, stage 5: Secondary | ICD-10-CM | POA: Insufficient documentation

## 2014-02-20 DIAGNOSIS — N921 Excessive and frequent menstruation with irregular cycle: Secondary | ICD-10-CM

## 2014-02-20 LAB — COMPREHENSIVE METABOLIC PANEL
ALT: 11 U/L (ref 0–35)
AST: 14 U/L (ref 0–37)
Albumin: 4 g/dL (ref 3.5–5.2)
Alkaline Phosphatase: 53 U/L (ref 39–117)
Anion gap: 8 (ref 5–15)
BUN: 60 mg/dL — ABNORMAL HIGH (ref 6–23)
CALCIUM: 10.1 mg/dL (ref 8.4–10.5)
CO2: 24 mmol/L (ref 19–32)
Chloride: 108 mmol/L (ref 96–112)
Creatinine, Ser: 7.61 mg/dL — ABNORMAL HIGH (ref 0.50–1.10)
GFR, EST AFRICAN AMERICAN: 7 mL/min — AB (ref >90)
GFR, EST NON AFRICAN AMERICAN: 6 mL/min — AB (ref >90)
Glucose, Bld: 95 mg/dL (ref 70–99)
Potassium: 4.4 mmol/L (ref 3.5–5.1)
SODIUM: 140 mmol/L (ref 135–145)
TOTAL PROTEIN: 7.4 g/dL (ref 6.0–8.3)
Total Bilirubin: 0.5 mg/dL (ref 0.3–1.2)

## 2014-02-20 LAB — CBC WITH DIFFERENTIAL/PLATELET
Basophils Absolute: 0 10*3/uL (ref 0.0–0.1)
Basophils Relative: 0 % (ref 0–1)
Eosinophils Absolute: 0.5 10*3/uL (ref 0.0–0.7)
Eosinophils Relative: 4 % (ref 0–5)
HCT: 31 % — ABNORMAL LOW (ref 36.0–46.0)
Hemoglobin: 10.1 g/dL — ABNORMAL LOW (ref 12.0–15.0)
Lymphocytes Relative: 12 % (ref 12–46)
Lymphs Abs: 1.5 10*3/uL (ref 0.7–4.0)
MCH: 30.9 pg (ref 26.0–34.0)
MCHC: 32.6 g/dL (ref 30.0–36.0)
MCV: 94.8 fL (ref 78.0–100.0)
Monocytes Absolute: 0.6 10*3/uL (ref 0.1–1.0)
Monocytes Relative: 5 % (ref 3–12)
Neutro Abs: 9.2 10*3/uL — ABNORMAL HIGH (ref 1.7–7.7)
Neutrophils Relative %: 79 % — ABNORMAL HIGH (ref 43–77)
Platelets: 199 10*3/uL (ref 150–400)
RBC: 3.27 MIL/uL — ABNORMAL LOW (ref 3.87–5.11)
RDW: 13.9 % (ref 11.5–15.5)
WBC: 11.8 10*3/uL — ABNORMAL HIGH (ref 4.0–10.5)

## 2014-02-20 LAB — SAMPLE TO BLOOD BANK

## 2014-02-20 MED ORDER — MORPHINE SULFATE 4 MG/ML IJ SOLN
4.0000 mg | Freq: Once | INTRAMUSCULAR | Status: AC
  Administered 2014-02-20: 4 mg via INTRAVENOUS
  Filled 2014-02-20: qty 1

## 2014-02-20 MED ORDER — MEGESTROL ACETATE 20 MG PO TABS
20.0000 mg | ORAL_TABLET | Freq: Every day | ORAL | Status: DC
  Administered 2014-02-20: 20 mg via ORAL
  Filled 2014-02-20: qty 1

## 2014-02-20 MED ORDER — SODIUM CHLORIDE 0.9 % IV BOLUS (SEPSIS)
500.0000 mL | Freq: Once | INTRAVENOUS | Status: AC
  Administered 2014-02-20: 500 mL via INTRAVENOUS

## 2014-02-20 MED ORDER — OXYCODONE-ACETAMINOPHEN 5-325 MG PO TABS: 1.0000 | ORAL_TABLET | Freq: Once | ORAL | Status: DC

## 2014-02-20 MED ORDER — ONDANSETRON 4 MG PO TBDP
8.0000 mg | ORAL_TABLET | Freq: Once | ORAL | Status: AC
  Administered 2014-02-20: 8 mg via ORAL

## 2014-02-20 MED ORDER — OXYCODONE-ACETAMINOPHEN 5-325 MG PO TABS
ORAL_TABLET | ORAL | Status: AC
  Administered 2014-02-20: 1
  Filled 2014-02-20: qty 1

## 2014-02-20 MED ORDER — OXYCODONE-ACETAMINOPHEN 5-325 MG PO TABS: 1.0000 | ORAL_TABLET | Freq: Four times a day (QID) | ORAL | Status: DC | PRN

## 2014-02-20 MED ORDER — ONDANSETRON HCL 4 MG/2ML IJ SOLN
4.0000 mg | Freq: Once | INTRAMUSCULAR | Status: AC
  Administered 2014-02-20: 4 mg via INTRAVENOUS
  Filled 2014-02-20: qty 2

## 2014-02-20 MED ORDER — MEGESTROL ACETATE 20 MG PO TABS: 20.0000 mg | ORAL_TABLET | Freq: Two times a day (BID) | ORAL | Status: DC

## 2014-02-20 MED ORDER — ONDANSETRON 4 MG PO TBDP
ORAL_TABLET | ORAL | Status: AC
  Filled 2014-02-20: qty 2

## 2014-02-20 NOTE — ED Notes (Signed)
This is the first periiod she has had in one year.

## 2014-02-20 NOTE — ED Notes (Signed)
The pt is c/o vaginal bleeding for a week heavy and clots.  She was on megase but it ran out and her next doctors appointment  Is march 4th.  lmp now  She gets shots for her anemia next due tomorrow in short stay

## 2014-02-20 NOTE — Discharge Instructions (Signed)
Chronic Kidney Disease °Chronic kidney disease occurs when the kidneys are damaged over a long period. The kidneys are two organs that lie on either side of the spine between the middle of the back and the front of the abdomen. The kidneys:  °· Remove wastes and extra water from the blood.   °· Produce important hormones. These help keep bones strong, regulate blood pressure, and help create red blood cells.   °· Balance the fluids and chemicals in the blood and tissues. °A small amount of kidney damage may not cause problems, but a large amount of damage may make it difficult or impossible for the kidneys to work the way they should. If steps are not taken to slow down the kidney damage or stop it from getting worse, the kidneys may stop working permanently. Most of the time, chronic kidney disease does not go away. However, it can often be controlled, and those with the disease can usually live normal lives. °CAUSES  °The most common causes of chronic kidney disease are diabetes and high blood pressure (hypertension). Chronic kidney disease may also be caused by:  °· Diseases that cause the kidneys' filters to become inflamed.   °· Diseases that affect the immune system.   °· Genetic diseases.   °· Medicines that damage the kidneys, such as anti-inflammatory medicines.   °· Poisoning or exposure to toxic substances.   °· A reoccurring kidney or urinary infection.   °· A problem with urine flow. This may be caused by:   °¨ Cancer.   °¨ Kidney stones.   °¨ An enlarged prostate in males. °SIGNS AND SYMPTOMS  °Because the kidney damage in chronic kidney disease occurs slowly, symptoms develop slowly and may not be obvious until the kidney damage becomes severe. A person may have a kidney disease for years without showing any symptoms. Symptoms can include:  °· Swelling (edema) of the legs, ankles, or feet.   °· Tiredness (lethargy).   °· Nausea or vomiting.   °· Confusion.   °· Problems with urination, such as:    °¨ Decreased urine production.   °¨ Frequent urination, especially at night.   °¨ Frequent accidents in children who are potty trained.   °· Muscle twitches and cramps.   °· Shortness of breath.  °· Weakness.   °· Persistent itchiness.   °· Loss of appetite. °· Metallic taste in the mouth. °· Trouble sleeping. °· Slowed development in children. °· Short stature in children. °DIAGNOSIS  °Chronic kidney disease may be detected and diagnosed by tests, including blood, urine, imaging, or kidney biopsy tests.  °TREATMENT  °Most chronic kidney diseases cannot be cured. Treatment usually involves relieving symptoms and preventing or slowing the progression of the disease. Treatment may include:  °· A special diet. You may need to avoid alcohol and foods that are salty and high in potassium.   °· Medicines. These may:   °¨ Lower blood pressure.   °¨ Relieve anemia.   °¨ Relieve swelling.   °¨ Protect the bones. °HOME CARE INSTRUCTIONS  °· Follow your prescribed diet.   °· Take medicines only as directed by your health care provider. Do not take any new medicines (prescription, over-the-counter, or nutritional supplements) unless approved by your health care provider. Many medicines can worsen your kidney damage or need to have the dose adjusted.   °· Quit smoking if you smoke. Talk to your health care provider about a smoking cessation program.   °· Keep all follow-up visits as directed by your health care provider. °SEEK IMMEDIATE MEDICAL CARE IF: °· Your symptoms get worse or you develop new symptoms.   °· You develop symptoms of end-stage kidney disease. These   include:   °¨ Headaches.   °¨ Abnormally dark or light skin.   °¨ Numbness in the hands or feet.   °¨ Easy bruising.   °¨ Frequent hiccups.   °¨ Menstruation stops.   °· You have a fever.   °· You have decreased urine production.   °· You have pain or bleeding when urinating. °MAKE SURE YOU: °· Understand these instructions. °· Will watch your condition. °· Will  get help right away if you are not doing well or get worse. °FOR MORE INFORMATION  °· American Association of Kidney Patients: www.aakp.org °· National Kidney Foundation: www.kidney.org °· [employer] Kid[employer]: www.akfinc.org °· [employer]: www.lifeoptions.org and www.kidneyschool.org °Document Released: 09/30/2007 Document Revised: 05/07/2013 Document Reviewed: 08/20/2011 °[employer]® Patient Information ©2015 [employer]. This information is not intended to replace advice given to you by your health care provider. Make sure you discuss any questions you have with your health care provider. ° °

## 2014-02-20 NOTE — ED Provider Notes (Signed)
CSN: [id number]     Arrival date & time 02/20/14  1634 History   First MD Initiated Contact with Patient 02/20/14 2026     Chief Complaint  Patient presents with  . Vaginal Bleeding     (Consider location/radiation/quality/duration/timing/severity/associated sxs/prior Treatment) HPI Comments: This is a morbidly obese 39 year old female with a history of menorrhagia who has been on Megace for the past year, but ran out and has had her first period in a year.  Reports cramping, heavy menstrual bleeding with clots.  She called a minute appointment with her OB/GYN for medication review/renewal, which is March 4.  She presents tonight because she is afraid she may become anemic and needed blood transfusions.  She has an appointment tomorrow for her normal monthly injections for her anemia.  Denies any vaginal discharge prior to the onset of her menses a week ago.  Denies new sexual partners.  States she has not taken anything for her  menstrual clamps .  She was advised by her kidney doctor not to use ibuprofen.  She is end-stage renal insufficiency, but has not started dialysis yet.  Her glomerular filtration rate today is 6.  Her BUN is 60 and her creatinine is 7.6.  She does have a dialysis graft in her right upper arm in preparation for dialysis  Patient is a 39 y.o. female presenting with vaginal bleeding. The history is provided by the patient.  Vaginal Bleeding Quality:  Heavier than menses Severity:  Moderate Onset quality:  Gradual Duration:  1 week Timing:  Constant Progression:  Unchanged Chronicity:  Recurrent Menstrual history:  Irregular Possible pregnancy: no   Context: at rest   Relieved by:  None tried Worsened by:  Nothing tried Ineffective treatments:  None tried Associated symptoms: abdominal pain   Associated symptoms: no dysuria, no fever, no nausea and no vaginal discharge   Risk factors: no hx of ectopic pregnancy, no hx of endometriosis, no gynecological surgery,  does not have multiple partners, no new sexual partner, no prior miscarriage, no STD exposure and no terminated pregnancies     Past Medical History  Diagnosis Date  . Hypertension   . Thyroid disease   . Morbid obesity   . Hyperlipidemia   . History of pneumonia   . Foot fracture   . Dilated cardiomyopathy   . CHF (congestive heart failure)     hosp. 2011  . Gout   . Anxiety     pt not aware of this  . Arthritis   . Anemia   . Menorrhagia   . Chronic kidney disease     not on dialysis at this time  . Encounter for blood transfusion   . History of blood transfusion   . Shortness of breath     seldom  . Pneumonia     hosp. 2010   Past Surgical History  Procedure Laterality Date  . Cardiac surgery  1979    repair of hole in heart  . Av fistula placement  08/04/2011    Procedure: ARTERIOVENOUS (AV) FISTULA CREATION;  Surgeon: Elam Dutch, MD;  Location: [facility] OR;  Service: Vascular;  Laterality: Right;  Creation right brachiocephalic arteriovenous fistula  . Av fistula placement Left 02/05/2013    Procedure: ARTERIOVENOUS FISTULA CREATION LEFT ARM;  Surgeon: Conrad Grapeville, MD;  Location: [place];  Service: Vascular;  Laterality: Left;  . Thrombectomy w/ embolectomy Left 03/05/2013    Procedure: THROMBECTOMY OF LEFT BRACHIAL ARTERY;  Surgeon: Conrad Brandon[place],  MD;  Location: [facility] OR;  Service: Vascular;  Laterality: Left;  . Ligation of arteriovenous  fistula Left 03/05/2013    Procedure: LIGATION OF ARTERIOVENOUS  FISTULA;  Surgeon: Conrad [place] Hill, MD;  Location: [place];  Service: Vascular;  Laterality: Left;  . Patch angioplasty Left 03/05/2013    Procedure: PATCH ANGIOPLASTY;  Surgeon: Conrad Wagon Mound, MD;  Location: [place];  Service: Vascular;  Laterality: Left;  . Av fistula placement Right 06/04/2013    Procedure: ARTERIOVENOUS (AV) FISTULA CREATION;  Surgeon: Conrad Chippe[place] Lake, MD;  Location: [place];  Service: Vascular;  Laterality: Right;  . Bascilic vein transposition Right 08/13/2013     Procedure: Second Stage Brachial Vein Transposition;  Surgeon: Conrad [place] Ridge, MD;  Location: [place];  Service: Vascular;  Laterality: Right;   Family History  Problem Relation Age of Onset  . Diabetes Mother   . Hyperlipidemia Mother   . Hypertension Mother   . Kidney disease Mother   . Anesthesia problems Neg Hx   . Hypotension Neg Hx   . Malignant hyperthermia Neg Hx   . Pseudochol deficiency Neg Hx    History  Substance Use Topics  . Smoking status: Never Smoker   . Smokeless tobacco: Never Used  . Alcohol Use: No   OB History    No data available     Review of Systems  Constitutional: Negative for fever and chills.  Respiratory: Negative for cough.   Cardiovascular: Negative for chest pain and leg swelling.  Gastrointestinal: Positive for abdominal pain. Negative for nausea and vomiting.  Genitourinary: Positive for vaginal bleeding and menstrual problem. Negative for dysuria, vaginal discharge and vaginal pain.  Neurological: Negative for headaches.  All other systems reviewed and are negative.     Allergies  Review of patient's allergies indicates no known allergies.  Home Medications   Prior to Admission medications   Medication Sig Start Date End Date Taking? Authorizing Provider  allopurinol (ZYLOPRIM) 100 MG tablet Take 100 mg by mouth 2 (two) times daily.    Yes Historical Provider, MD  calcitRIOL (ROCALTROL) 0.25 MCG capsule Take 0.25 mcg by mouth 3 (three) times a week. Monday Wednesday and Friday   Yes Historical Provider, MD  Calcium Carb-Cholecalciferol (CALCIUM 500 +D PO) Take 3 tablets by mouth daily.   Yes Historical Provider, MD  carvedilol (COREG) 25 MG tablet Take 25 mg by mouth 2 (two) times daily with a meal.   Yes Historical Provider, MD  ferrous sulfate 325 (65 FE) MG EC tablet Take 1 tablet (325 mg total) by mouth 2 (two) times daily. 04/25/13  Yes Myra Marijo Sanes, MD  furosemide (LASIX) 80 MG tablet Take 80 mg by mouth 2 (two) times daily.   Yes  Historical Provider, MD  hydrALAZINE (APRESOLINE) 100 MG tablet Take 100 mg by mouth 3 (three) times daily.  05/30/13  Yes Historical Provider, MD  pravastatin (PRAVACHOL) 40 MG tablet Take 40 mg by mouth daily.  06/01/13  Yes Historical Provider, MD  RENVELA 800 MG tablet Take 1,600 mg by mouth 3 (three) times daily with meals.  09/04/13  Yes Historical Provider, MD  HYDROcodone-acetaminophen (NORCO) 5-325 MG per tablet Take 2 tablets by mouth every 4 (four) hours as needed. 11/10/13   Pura Spice, PA-C  megestrol (MEGACE) 20 MG tablet Take 1 tablet (20 mg total) by mouth 2 (two) times daily. 02/20/14   Garald Balding, NP  oxyCODONE-acetaminophen (PERCOCET/ROXICET) 5-325 MG per tablet Take 1 tablet by  mouth every 6 (six) hours as needed for severe pain. 02/20/14   Garald Balding, NP   BP 121/70 mmHg  Pulse 69  Temp(Src) 98.3 F (36.8 C) (Oral)  Resp 18  Ht 5' 9.5" (1.765 m)  Wt 330 lb (149.687 kg)  BMI 48.05 kg/m2  SpO2 100%  LMP 02/20/2014 Physical Exam  Constitutional: She appears well-developed and well-nourished.  Morbidly obese  HENT:  Head: Normocephalic.  Eyes: Pupils are equal, round, and reactive to light.  Neck: Normal range of motion.  Cardiovascular: Normal rate.   Pulmonary/Chest: Effort normal.  Abdominal: Soft. She exhibits no distension. There is no tenderness.  Musculoskeletal: Normal range of motion. She exhibits no edema or tenderness.  Neurological: She is alert.  Skin: Skin is warm and dry.    ED Course  Procedures (including critical care time) Labs Review Labs Reviewed  CBC WITH DIFFERENTIAL/PLATELET - Abnormal; Notable for the following:    WBC 11.8 (*)    RBC 3.27 (*)    Hemoglobin 10.1 (*)    HCT 31.0 (*)    Neutrophils Relative % 79 (*)    Neutro Abs 9.2 (*)    All other components within normal limits  COMPREHENSIVE METABOLIC PANEL - Abnormal; Notable for the following:    BUN 60 (*)    Creatinine, Ser 7.61 (*)    GFR calc non Af Amer 6 (*)     GFR calc Af Amer 7 (*)    All other components within normal limits  SAMPLE TO BLOOD BANK    Imaging Review No results found.   EKG Interpretation None     Will give Rx for Megace, have patient FU with clinic tomorrow for normal injection, and with GYN as scheduled on March 4th  MDM   Final diagnoses:  CKD (chronic kidney disease) stage 5, GFR less than 15 ml/min  Menorrhagia with irregular cycle       Garald Balding, NP 02/21/14 1956  Varney Biles, MD 02/22/14 0009

## 2014-02-21 ENCOUNTER — Encounter (HOSPITAL_COMMUNITY)
Admission: RE | Admit: 2014-02-21 | Discharge: 2014-02-21 | Disposition: A | Discharge status: Home / Self Care | Payer: BLUE CROSS/BLUE SHIELD | Source: Ambulatory Visit | Attending: Nephrology | Admitting: Nephrology

## 2014-02-21 DIAGNOSIS — N185 Chronic kidney disease, stage 5: Secondary | ICD-10-CM | POA: Insufficient documentation

## 2014-02-21 DIAGNOSIS — D631 Anemia in chronic kidney disease: Secondary | ICD-10-CM | POA: Insufficient documentation

## 2014-02-21 MED ORDER — DARBEPOETIN ALFA 100 MCG/0.5ML IJ SOSY
100.0000 ug | PREFILLED_SYRINGE | INTRAMUSCULAR | Status: DC
  Administered 2014-02-21: 100 ug via SUBCUTANEOUS

## 2014-02-22 MED ORDER — DARBEPOETIN ALFA 100 MCG/0.5ML IJ SOSY
PREFILLED_SYRINGE | INTRAMUSCULAR | Status: AC
  Filled 2014-02-22: qty 0.5

## 2014-02-26 ENCOUNTER — Encounter (HOSPITAL_COMMUNITY): Payer: Self-pay | Admitting: *Deleted

## 2014-02-26 ENCOUNTER — Inpatient Hospital Stay (HOSPITAL_COMMUNITY)
Admission: EM | Admit: 2014-02-26 | Discharge: 2014-03-01 | DRG: 682 | Disposition: A | Discharge status: Home / Self Care | Payer: BLUE CROSS/BLUE SHIELD | Attending: Internal Medicine | Admitting: Internal Medicine

## 2014-02-26 DIAGNOSIS — D500 Iron deficiency anemia secondary to blood loss (chronic): Secondary | ICD-10-CM | POA: Diagnosis present

## 2014-02-26 DIAGNOSIS — I120 Hypertensive chronic kidney disease with stage 5 chronic kidney disease or end stage renal disease: Principal | ICD-10-CM | POA: Diagnosis present

## 2014-02-26 DIAGNOSIS — Z992 Dependence on renal dialysis: Secondary | ICD-10-CM

## 2014-02-26 DIAGNOSIS — N920 Excessive and frequent menstruation with regular cycle: Secondary | ICD-10-CM | POA: Diagnosis present

## 2014-02-26 DIAGNOSIS — N185 Chronic kidney disease, stage 5: Secondary | ICD-10-CM | POA: Diagnosis present

## 2014-02-26 DIAGNOSIS — M1990 Unspecified osteoarthritis, unspecified site: Secondary | ICD-10-CM | POA: Diagnosis present

## 2014-02-26 DIAGNOSIS — R531 Weakness: Secondary | ICD-10-CM | POA: Diagnosis not present

## 2014-02-26 DIAGNOSIS — M109 Gout, unspecified: Secondary | ICD-10-CM | POA: Diagnosis present

## 2014-02-26 DIAGNOSIS — E079 Disorder of thyroid, unspecified: Secondary | ICD-10-CM | POA: Diagnosis present

## 2014-02-26 DIAGNOSIS — T829XXA Unspecified complication of cardiac and vascular prosthetic device, implant and graft, initial encounter: Secondary | ICD-10-CM | POA: Diagnosis present

## 2014-02-26 DIAGNOSIS — E785 Hyperlipidemia, unspecified: Secondary | ICD-10-CM | POA: Diagnosis present

## 2014-02-26 DIAGNOSIS — Z6841 Body Mass Index (BMI) 40.0 and over, adult: Secondary | ICD-10-CM

## 2014-02-26 DIAGNOSIS — I420 Dilated cardiomyopathy: Secondary | ICD-10-CM | POA: Diagnosis present

## 2014-02-26 DIAGNOSIS — I10 Essential (primary) hypertension: Secondary | ICD-10-CM | POA: Diagnosis present

## 2014-02-26 DIAGNOSIS — N186 End stage renal disease: Secondary | ICD-10-CM | POA: Diagnosis present

## 2014-02-26 DIAGNOSIS — F419 Anxiety disorder, unspecified: Secondary | ICD-10-CM | POA: Diagnosis present

## 2014-02-26 DIAGNOSIS — N921 Excessive and frequent menstruation with irregular cycle: Secondary | ICD-10-CM | POA: Diagnosis present

## 2014-02-26 DIAGNOSIS — T8249XA Other complication of vascular dialysis catheter, initial encounter: Secondary | ICD-10-CM

## 2014-02-26 DIAGNOSIS — D72829 Elevated white blood cell count, unspecified: Secondary | ICD-10-CM | POA: Diagnosis present

## 2014-02-26 DIAGNOSIS — I509 Heart failure, unspecified: Secondary | ICD-10-CM | POA: Diagnosis present

## 2014-02-26 DIAGNOSIS — D649 Anemia, unspecified: Secondary | ICD-10-CM | POA: Diagnosis present

## 2014-02-26 NOTE — ED Provider Notes (Signed)
CSN: [id number]     Arrival date & time 02/26/14  2321 History  This chart was scribed for Kalman Drape, MD by Hilda Lias, ED Scribe. This patient was seen in room D30C/D30C and the patient's care was started at 12:02 AM.    Chief Complaint  Patient presents with  . Abnormal labs   . Weakness     The history is provided by the patient. No language interpreter was used.     HPI Comments: [name] is a 39 y.o. female with renal failure, HTN, Morbid obesity, HLD, and thyroid dz who presents to the Emergency Department complaining of anemia due to heavy menstrual cycles that has been intermittent for three weeks. Pt states she has had her menstrual cycle for three straight weeks and has been bleeding enough to change her pad every hour at times. Pt notes that her bleeding occurs in clots. Pt is taking Megace 20 mg 2x per day for her symptoms. Pt also notes having intermittent weakness associated with her heavy menstrual cycles. Pt states she had a blood transfusion for similar symptoms around one year ago. Pt denies abdominal pain.  Patient currently receiving Aranesp injections every 2 weeks.  4.  Chronic anemia.    Past Medical History  Diagnosis Date  . Hypertension   . Thyroid disease   . Morbid obesity   . Hyperlipidemia   . History of pneumonia   . Foot fracture   . Dilated cardiomyopathy   . CHF (congestive heart failure)     hosp. 2011  . Gout   . Anxiety     pt not aware of this  . Arthritis   . Anemia   . Menorrhagia   . Chronic kidney disease     not on dialysis at this time  . Encounter for blood transfusion   . History of blood transfusion   . Shortness of breath     seldom  . Pneumonia     hosp. 2010   Past Surgical History  Procedure Laterality Date  . Cardiac surgery  1979    repair of hole in heart  . Av fistula placement  08/04/2011    Procedure: ARTERIOVENOUS (AV) FISTULA CREATION;  Surgeon: Elam Dutch, MD;  Location: [facility] OR;  Service:  Vascular;  Laterality: Right;  Creation right brachiocephalic arteriovenous fistula  . Av fistula placement Left 02/05/2013    Procedure: ARTERIOVENOUS FISTULA CREATION LEFT ARM;  Surgeon: Conrad Viburnum, MD;  Location: [place];  Service: Vascular;  Laterality: Left;  . Thrombectomy w/ embolectomy Left 03/05/2013    Procedure: THROMBECTOMY OF LEFT BRACHIAL ARTERY;  Surgeon: Conrad Emmons, MD;  Location: [place];  Service: Vascular;  Laterality: Left;  . Ligation of arteriovenous  fistula Left 03/05/2013    Procedure: LIGATION OF ARTERIOVENOUS  FISTULA;  Surgeon: Conrad Bismarck, MD;  Location: [place];  Service: Vascular;  Laterality: Left;  . Patch angioplasty Left 03/05/2013    Procedure: PATCH ANGIOPLASTY;  Surgeon: Conrad Broussard, MD;  Location: [place];  Service: Vascular;  Laterality: Left;  . Av fistula placement Right 06/04/2013    Procedure: ARTERIOVENOUS (AV) FISTULA CREATION;  Surgeon: Conrad Clearlake, MD;  Location: [place];  Service: Vascular;  Laterality: Right;  . Bascilic vein transposition Right 08/13/2013    Procedure: Second Stage Brachial Vein Transposition;  Surgeon: Conrad Bush, MD;  Location: [place];  Service: Vascular;  Laterality: Right;   Family History  Problem Relation  Age of Onset  . Diabetes Mother   . Hyperlipidemia Mother   . Hypertension Mother   . Kidney disease Mother   . Anesthesia problems Neg Hx   . Hypotension Neg Hx   . Malignant hyperthermia Neg Hx   . Pseudochol deficiency Neg Hx    History  Substance Use Topics  . Smoking status: Never Smoker   . Smokeless tobacco: Never Used  . Alcohol Use: No   OB History    No data available     Review of Systems  Gastrointestinal: Negative for abdominal pain.  Genitourinary: Positive for vaginal bleeding.  Neurological: Positive for weakness.  All other systems reviewed and are negative.     Allergies  Review of patient's allergies indicates no known allergies.  Home Medications   Prior to Admission medications    Medication Sig Start Date End Date Taking? Authorizing Provider  allopurinol (ZYLOPRIM) 100 MG tablet Take 100 mg by mouth 2 (two) times daily.     Historical Provider, MD  calcitRIOL (ROCALTROL) 0.25 MCG capsule Take 0.25 mcg by mouth 3 (three) times a week. Monday Wednesday and Friday    Historical Provider, MD  Calcium Carb-Cholecalciferol (CALCIUM 500 +D PO) Take 3 tablets by mouth daily.    Historical Provider, MD  carvedilol (COREG) 25 MG tablet Take 25 mg by mouth 2 (two) times daily with a meal.    Historical Provider, MD  ferrous sulfate 325 (65 FE) MG EC tablet Take 1 tablet (325 mg total) by mouth 2 (two) times daily. 04/25/13   Emily Filbert, MD  furosemide (LASIX) 80 MG tablet Take 80 mg by mouth 2 (two) times daily.    Historical Provider, MD  hydrALAZINE (APRESOLINE) 100 MG tablet Take 100 mg by mouth 3 (three) times daily.  05/30/13   Historical Provider, MD  HYDROcodone-acetaminophen (NORCO) 5-325 MG per tablet Take 2 tablets by mouth every 4 (four) hours as needed. 11/10/13   Pura Spice, PA-C  megestrol (MEGACE) 20 MG tablet Take 1 tablet (20 mg total) by mouth 2 (two) times daily. 02/20/14   Garald Balding, NP  oxyCODONE-acetaminophen (PERCOCET/ROXICET) 5-325 MG per tablet Take 1 tablet by mouth every 6 (six) hours as needed for severe pain. 02/20/14   Garald Balding, NP  pravastatin (PRAVACHOL) 40 MG tablet Take 40 mg by mouth daily.  06/01/13   Historical Provider, MD  RENVELA 800 MG tablet Take 1,600 mg by mouth 3 (three) times daily with meals.  09/04/13   Historical Provider, MD   BP 94/56 mmHg  Pulse 98  Temp(Src) 98.3 F (36.8 C) (Oral)  Resp 18  Ht 5\' 9"  (1.753 m)  Wt 324 lb (146.965 kg)  BMI 47.82 kg/m2  SpO2 98%  LMP 02/26/2014 Physical Exam  Constitutional: She is oriented to person, place, and time. She appears well-developed and well-nourished.  HENT:  Head: Normocephalic and atraumatic.  Nose: Nose normal.  Mouth/Throat: Oropharynx is clear and moist.   Eyes: Conjunctivae and EOM are normal. Pupils are equal, round, and reactive to light.  Neck: Normal range of motion. Neck supple. No JVD present. No tracheal deviation present. No thyromegaly present.  Cardiovascular: Normal rate, regular rhythm, normal heart sounds and intact distal pulses.  Exam reveals no gallop and no friction rub.   No murmur heard. Pulmonary/Chest: Effort normal and breath sounds normal. No stridor. No respiratory distress. She has no wheezes. She has no rales. She exhibits no tenderness.  Abdominal: Soft. Bowel sounds  are normal. She exhibits no distension and no mass. There is no tenderness. There is no rebound and no guarding.  Musculoskeletal: Normal range of motion. She exhibits no edema or tenderness.  Lymphadenopathy:    She has no cervical adenopathy.  Neurological: She is alert and oriented to person, place, and time. She displays normal reflexes. She exhibits normal muscle tone. Coordination normal.  Skin: Skin is warm and dry. No rash noted. No erythema. No pallor.  Psychiatric: She has a normal mood and affect. Her behavior is normal. Judgment and thought content normal.  Nursing note and vitals reviewed.   ED Course  Procedures (including critical care time)  DIAGNOSTIC STUDIES: Oxygen Saturation is 98% on RA, normal by my interpretation.    COORDINATION OF CARE: 12:07 AM Discussed treatment plan with pt at bedside and pt agreed to plan.   Labs Review Labs Reviewed  BASIC METABOLIC PANEL - Abnormal; Notable for the following:    Glucose, Bld 115 (*)    BUN 69 (*)    Creatinine, Ser 11.17 (*)    GFR calc non Af Amer 4 (*)    GFR calc Af Amer 4 (*)    All other components within normal limits  CBC WITH DIFFERENTIAL/PLATELET - Abnormal; Notable for the following:    WBC 14.0 (*)    RBC 2.04 (*)    Hemoglobin 6.6 (*)    HCT 19.7 (*)    Neutro Abs 9.9 (*)    All other components within normal limits  PREGNANCY, URINE  URINALYSIS, ROUTINE W  REFLEX MICROSCOPIC  SAMPLE TO BLOOD BANK  PREPARE RBC (CROSSMATCH)  TYPE AND SCREEN    Imaging Review No results found.   EKG Interpretation None      MDM   Final diagnoses:  Symptomatic anemia  Menorrhagia with irregular cycle  End stage renal disease    39 year old female with menorrhagia, told to go the ER today by her nephrologist due to anemia.  She has required blood transfusions in the past.  Patient is not significantly symptomatic, she does report some generalized weakness.  She has follow-up with her gynecologist in 2 weeks.  This is an ongoing issue.  Patient with current vaginal bleeding for 3 weeks, taking Megace as prescribed.  I personally performed the services described in this documentation, which was scribed in my presence. The recorded information has been reviewed and is accurate.     Kalman Drape, MD 02/27/14 [phone]

## 2014-02-26 NOTE — ED Notes (Signed)
Patient states she has become anemic due to her heavy periods.  Is followed by Dr Buelah Manis for her kidney problems

## 2014-02-26 NOTE — ED Notes (Signed)
Patient presents stating labs drawn today at MD office Called her to tell her to come to the ED  Hgb 6.2

## 2014-02-27 ENCOUNTER — Encounter (HOSPITAL_COMMUNITY): Payer: Self-pay

## 2014-02-27 ENCOUNTER — Inpatient Hospital Stay (HOSPITAL_COMMUNITY): Payer: BLUE CROSS/BLUE SHIELD

## 2014-02-27 DIAGNOSIS — D649 Anemia, unspecified: Secondary | ICD-10-CM | POA: Diagnosis present

## 2014-02-27 DIAGNOSIS — N921 Excessive and frequent menstruation with irregular cycle: Secondary | ICD-10-CM | POA: Diagnosis present

## 2014-02-27 DIAGNOSIS — I120 Hypertensive chronic kidney disease with stage 5 chronic kidney disease or end stage renal disease: Secondary | ICD-10-CM | POA: Diagnosis present

## 2014-02-27 DIAGNOSIS — R531 Weakness: Secondary | ICD-10-CM | POA: Diagnosis present

## 2014-02-27 DIAGNOSIS — N185 Chronic kidney disease, stage 5: Secondary | ICD-10-CM

## 2014-02-27 DIAGNOSIS — I509 Heart failure, unspecified: Secondary | ICD-10-CM | POA: Diagnosis present

## 2014-02-27 DIAGNOSIS — I10 Essential (primary) hypertension: Secondary | ICD-10-CM

## 2014-02-27 DIAGNOSIS — E785 Hyperlipidemia, unspecified: Secondary | ICD-10-CM | POA: Diagnosis present

## 2014-02-27 DIAGNOSIS — F419 Anxiety disorder, unspecified: Secondary | ICD-10-CM | POA: Diagnosis present

## 2014-02-27 DIAGNOSIS — N186 End stage renal disease: Secondary | ICD-10-CM | POA: Diagnosis present

## 2014-02-27 DIAGNOSIS — I420 Dilated cardiomyopathy: Secondary | ICD-10-CM | POA: Diagnosis present

## 2014-02-27 DIAGNOSIS — Z992 Dependence on renal dialysis: Secondary | ICD-10-CM | POA: Diagnosis not present

## 2014-02-27 DIAGNOSIS — N920 Excessive and frequent menstruation with regular cycle: Secondary | ICD-10-CM | POA: Diagnosis present

## 2014-02-27 DIAGNOSIS — D500 Iron deficiency anemia secondary to blood loss (chronic): Secondary | ICD-10-CM | POA: Diagnosis present

## 2014-02-27 DIAGNOSIS — M109 Gout, unspecified: Secondary | ICD-10-CM | POA: Diagnosis present

## 2014-02-27 DIAGNOSIS — Z6841 Body Mass Index (BMI) 40.0 and over, adult: Secondary | ICD-10-CM | POA: Diagnosis not present

## 2014-02-27 DIAGNOSIS — M1990 Unspecified osteoarthritis, unspecified site: Secondary | ICD-10-CM | POA: Diagnosis present

## 2014-02-27 DIAGNOSIS — E079 Disorder of thyroid, unspecified: Secondary | ICD-10-CM | POA: Diagnosis present

## 2014-02-27 LAB — BASIC METABOLIC PANEL
Anion gap: 8 (ref 5–15)
BUN: 69 mg/dL — AB (ref 6–23)
CHLORIDE: 106 mmol/L (ref 96–112)
CO2: 22 mmol/L (ref 19–32)
CREATININE: 11.17 mg/dL — AB (ref 0.50–1.10)
Calcium: 9.4 mg/dL (ref 8.4–10.5)
GFR calc Af Amer: 4 mL/min — ABNORMAL LOW (ref >90)
GFR calc non Af Amer: 4 mL/min — ABNORMAL LOW (ref >90)
GLUCOSE: 115 mg/dL — AB (ref 70–99)
Potassium: 3.5 mmol/L (ref 3.5–5.1)
Sodium: 136 mmol/L (ref 135–145)

## 2014-02-27 LAB — CBC WITH DIFFERENTIAL/PLATELET
Basophils Absolute: 0 10*3/uL (ref 0.0–0.1)
Basophils Relative: 0 % (ref 0–1)
Eosinophils Absolute: 0.4 10*3/uL (ref 0.0–0.7)
Eosinophils Relative: 3 % (ref 0–5)
HEMATOCRIT: 19.7 % — AB (ref 36.0–46.0)
HEMOGLOBIN: 6.6 g/dL — AB (ref 12.0–15.0)
LYMPHS ABS: 3 10*3/uL (ref 0.7–4.0)
LYMPHS PCT: 21 % (ref 12–46)
MCH: 32.4 pg (ref 26.0–34.0)
MCHC: 33.5 g/dL (ref 30.0–36.0)
MCV: 96.6 fL (ref 78.0–100.0)
Monocytes Absolute: 0.7 10*3/uL (ref 0.1–1.0)
Monocytes Relative: 5 % (ref 3–12)
NEUTROS ABS: 9.9 10*3/uL — AB (ref 1.7–7.7)
Neutrophils Relative %: 71 % (ref 43–77)
Platelets: 244 10*3/uL (ref 150–400)
RBC: 2.04 MIL/uL — ABNORMAL LOW (ref 3.87–5.11)
RDW: 14.7 % (ref 11.5–15.5)
WBC: 14 10*3/uL — AB (ref 4.0–10.5)

## 2014-02-27 LAB — SAMPLE TO BLOOD BANK

## 2014-02-27 LAB — PREGNANCY, URINE: PREG TEST UR: NEGATIVE

## 2014-02-27 LAB — PREPARE RBC (CROSSMATCH)

## 2014-02-27 LAB — HEMOGLOBIN AND HEMATOCRIT, BLOOD
HEMATOCRIT: 21.5 % — AB (ref 36.0–46.0)
Hemoglobin: 7.3 g/dL — ABNORMAL LOW (ref 12.0–15.0)

## 2014-02-27 MED ORDER — PRAVASTATIN SODIUM 40 MG PO TABS
40.0000 mg | ORAL_TABLET | Freq: Every day | ORAL | Status: DC
  Administered 2014-02-27 – 2014-02-28 (×2): 40 mg via ORAL
  Filled 2014-02-27 (×3): qty 1

## 2014-02-27 MED ORDER — ACETAMINOPHEN 325 MG PO TABS
650.0000 mg | ORAL_TABLET | Freq: Four times a day (QID) | ORAL | Status: DC | PRN
Start: 2014-02-27 — End: 2014-03-01

## 2014-02-27 MED ORDER — FUROSEMIDE 10 MG/ML IJ SOLN
40.0000 mg | Freq: Once | INTRAMUSCULAR | Status: AC
  Administered 2014-02-27: 40 mg via INTRAVENOUS
  Filled 2014-02-27: qty 4

## 2014-02-27 MED ORDER — SODIUM CHLORIDE 0.9 % IJ SOLN
3.0000 mL | Freq: Two times a day (BID) | INTRAMUSCULAR | Status: DC
  Administered 2014-02-27 – 2014-03-01 (×6): 3 mL via INTRAVENOUS

## 2014-02-27 MED ORDER — ONDANSETRON HCL 4 MG PO TABS: 4.0000 mg | ORAL_TABLET | Freq: Four times a day (QID) | ORAL | Status: DC | PRN

## 2014-02-27 MED ORDER — FERROUS SULFATE 325 (65 FE) MG PO TABS
325.0000 mg | ORAL_TABLET | Freq: Two times a day (BID) | ORAL | Status: DC
  Administered 2014-02-27 – 2014-02-28 (×4): 325 mg via ORAL
  Filled 2014-02-27 (×7): qty 1

## 2014-02-27 MED ORDER — SODIUM CHLORIDE 0.9 % IV SOLN
10.0000 mL/h | Freq: Once | INTRAVENOUS | Status: DC
Start: 2014-02-27 — End: 2014-02-27

## 2014-02-27 MED ORDER — CARVEDILOL 25 MG PO TABS
25.0000 mg | ORAL_TABLET | Freq: Two times a day (BID) | ORAL | Status: DC
  Administered 2014-02-27 – 2014-03-01 (×5): 25 mg via ORAL
  Filled 2014-02-27 (×7): qty 1

## 2014-02-27 MED ORDER — ALLOPURINOL 100 MG PO TABS
100.0000 mg | ORAL_TABLET | Freq: Two times a day (BID) | ORAL | Status: DC
  Administered 2014-02-27 – 2014-03-01 (×5): 100 mg via ORAL
  Filled 2014-02-27 (×6): qty 1

## 2014-02-27 MED ORDER — SEVELAMER CARBONATE 800 MG PO TABS
1600.0000 mg | ORAL_TABLET | Freq: Three times a day (TID) | ORAL | Status: DC
  Administered 2014-02-27 – 2014-02-28 (×6): 1600 mg via ORAL
  Filled 2014-02-27 (×10): qty 2

## 2014-02-27 MED ORDER — FUROSEMIDE 80 MG PO TABS
80.0000 mg | ORAL_TABLET | Freq: Two times a day (BID) | ORAL | Status: DC
  Administered 2014-02-27 – 2014-03-01 (×5): 80 mg via ORAL
  Filled 2014-02-27 (×7): qty 1

## 2014-02-27 MED ORDER — SODIUM CHLORIDE 0.9 % IV SOLN
Freq: Once | INTRAVENOUS | Status: AC
  Administered 2014-02-27: 10 mL/h via INTRAVENOUS

## 2014-02-27 MED ORDER — ACETAMINOPHEN 650 MG RE SUPP: 650.0000 mg | Freq: Four times a day (QID) | RECTAL | Status: DC | PRN

## 2014-02-27 MED ORDER — ONDANSETRON HCL 4 MG/2ML IJ SOLN: 4.0000 mg | Freq: Four times a day (QID) | INTRAMUSCULAR | Status: DC | PRN

## 2014-02-27 MED ORDER — MORPHINE SULFATE 2 MG/ML IJ SOLN: 1.0000 mg | INTRAMUSCULAR | Status: DC | PRN

## 2014-02-27 MED ORDER — HYDRALAZINE HCL 50 MG PO TABS
50.0000 mg | ORAL_TABLET | Freq: Three times a day (TID) | ORAL | Status: DC
  Administered 2014-02-27 – 2014-03-01 (×4): 50 mg via ORAL
  Filled 2014-02-27 (×10): qty 1

## 2014-02-27 MED ORDER — OXYCODONE-ACETAMINOPHEN 5-325 MG PO TABS: 1.0000 | ORAL_TABLET | Freq: Four times a day (QID) | ORAL | Status: DC | PRN

## 2014-02-27 MED ORDER — MEGESTROL ACETATE 40 MG PO TABS
40.0000 mg | ORAL_TABLET | Freq: Two times a day (BID) | ORAL | Status: DC
  Administered 2014-02-27 – 2014-03-01 (×5): 40 mg via ORAL
  Filled 2014-02-27 (×6): qty 1

## 2014-02-27 NOTE — H&P (Signed)
[facility] History and Physical  [name] [id number] DOB: [birth year] DOA: 02/26/2014  Referring physician: ER physician. PCP: Pcp Not In System  Chief Complaint: Severe anemia.  HPI: [name] is a 39 y.o. female with known history of chronic kidney disease stage V, hypertension, cardiomyopathy, gout, hypertension was referred to the ER by patient's nephrologist after patient's hemoglobin was found to be low. Patient has been having menorrhagia and is on Megace and was not taking it last month as patient ran out of it and had refilled from the ER last week which patient restarted. Labs done at her nephrologist's office showed low hemoglobin and was referred to the ER. Patient states she still has menorrhagia with clots. Denies any chest pain shortness of breath dizziness or loss of consciousness.   Review of Systems: As presented in the history of presenting illness, rest negative.  Past Medical History  Diagnosis Date  . Hypertension   . Thyroid disease   . Morbid obesity   . Hyperlipidemia   . History of pneumonia   . Foot fracture   . Dilated cardiomyopathy   . CHF (congestive heart failure)     hosp. 2011  . Gout   . Anxiety     pt not aware of this  . Arthritis   . Anemia   . Menorrhagia   . Chronic kidney disease     not on dialysis at this time  . Encounter for blood transfusion   . History of blood transfusion   . Shortness of breath     seldom  . Pneumonia     hosp. 2010   Past Surgical History  Procedure Laterality Date  . Cardiac surgery  1979    repair of hole in heart  . Av fistula placement  08/04/2011    Procedure: ARTERIOVENOUS (AV) FISTULA CREATION;  Surgeon: Elam Dutch, MD;  Location: [facility] OR;  Service: Vascular;  Laterality: Right;  Creation right brachiocephalic arteriovenous fistula  . Av fistula placement Left 02/05/2013    Procedure: ARTERIOVENOUS FISTULA CREATION LEFT ARM;  Surgeon: Conrad Parkdale, MD;  Location: [place];   Service: Vascular;  Laterality: Left;  . Thrombectomy w/ embolectomy Left 03/05/2013    Procedure: THROMBECTOMY OF LEFT BRACHIAL ARTERY;  Surgeon: Conrad Suitland, MD;  Location: [place];  Service: Vascular;  Laterality: Left;  . Ligation of arteriovenous  fistula Left 03/05/2013    Procedure: LIGATION OF ARTERIOVENOUS  FISTULA;  Surgeon: Conrad Whitten, MD;  Location: [place];  Service: Vascular;  Laterality: Left;  . Patch angioplasty Left 03/05/2013    Procedure: PATCH ANGIOPLASTY;  Surgeon: Conrad Whitehouse, MD;  Location: [place];  Service: Vascular;  Laterality: Left;  . Av fistula placement Right 06/04/2013    Procedure: ARTERIOVENOUS (AV) FISTULA CREATION;  Surgeon: Conrad [place], MD;  Location: [place];  Service: Vascular;  Laterality: Right;  . Bascilic vein transposition Right 08/13/2013    Procedure: Second Stage Brachial Vein Transposition;  Surgeon: Conrad Concord, MD;  Location: [place];  Service: Vascular;  Laterality: Right;   Social History:  reports that she has never smoked. She has never used smokeless tobacco. She reports that she does not drink alcohol or use illicit drugs. Where does patient live home. Can patient participate in ADLs? Yes.  No Known Allergies  Family History:  Family History  Problem Relation Age of Onset  . Diabetes Mother   . Hyperlipidemia Mother   . Hypertension Mother   .  Kidney disease Mother   . Anesthesia problems Neg Hx   . Hypotension Neg Hx   . Malignant hyperthermia Neg Hx   . Pseudochol deficiency Neg Hx       Prior to Admission medications   Medication Sig Start Date End Date Taking? Authorizing Provider  allopurinol (ZYLOPRIM) 100 MG tablet Take 100 mg by mouth 2 (two) times daily.    Yes Historical Provider, MD  carvedilol (COREG) 25 MG tablet Take 25 mg by mouth 2 (two) times daily with a meal.   Yes Historical Provider, MD  ferrous sulfate 325 (65 FE) MG EC tablet Take 1 tablet (325 mg total) by mouth 2 (two) times daily. 04/25/13  Yes Myra Marijo Sanes, MD   furosemide (LASIX) 80 MG tablet Take 80 mg by mouth 2 (two) times daily.   Yes Historical Provider, MD  hydrALAZINE (APRESOLINE) 100 MG tablet Take 50 mg by mouth 3 (three) times daily.  05/30/13  Yes Historical Provider, MD  megestrol (MEGACE) 20 MG tablet Take 1 tablet (20 mg total) by mouth 2 (two) times daily. 02/20/14  Yes Garald Balding, NP  oxyCODONE-acetaminophen (PERCOCET/ROXICET) 5-325 MG per tablet Take 1 tablet by mouth every 6 (six) hours as needed for severe pain. 02/20/14  Yes Garald Balding, NP  pravastatin (PRAVACHOL) 40 MG tablet Take 40 mg by mouth daily.  06/01/13  Yes Historical Provider, MD  RENVELA 800 MG tablet Take 1,600 mg by mouth 3 (three) times daily with meals.  09/04/13  Yes Historical Provider, MD  calcitRIOL (ROCALTROL) 0.25 MCG capsule Take 0.25 mcg by mouth 3 (three) times a week. Monday Wednesday and Friday    Historical Provider, MD  Calcium Carb-Cholecalciferol (CALCIUM 500 +D PO) Take 3 tablets by mouth daily.    Historical Provider, MD  HYDROcodone-acetaminophen (NORCO) 5-325 MG per tablet Take 2 tablets by mouth every 4 (four) hours as needed. Patient not taking: Reported on 02/26/2014 11/10/13   Pura Spice, PA-C    Physical Exam: Filed Vitals:   02/26/14 2331 02/27/14 0130 02/27/14 0206  BP: 94/56 105/49 115/55  Pulse: 98 73 82  Temp: 98.3 F (36.8 C)  98.3 F (36.8 C)  TempSrc: Oral  Oral  Resp: 18 17 18   Height: 5\' 9"  (1.753 m)    Weight: 146.965 kg (324 lb)  144.652 kg (318 lb 14.4 oz)  SpO2: 98% 100% 100%     General:  Obese not in distress.  Eyes: Anicteric mild pallor.  ENT: No discharge from the ears eyes nose or mouth.  Neck: No mass felt.  Cardiovascular: S1 and S2 heard.  Respiratory: No rhonchi or crepitations.  Abdomen: Soft nontender bowel sounds present.  Skin: No rash.  Musculoskeletal: No edema.  Psychiatric: Appears normal.  Neurologic: Alert awake oriented to time place and person. Moves all  extremities.  Labs on Admission:  Basic Metabolic Panel:  Recent Labs Lab 02/20/14 1728 02/27/14 0006  NA 140 136  K 4.4 3.5  CL 108 106  CO2 24 22  GLUCOSE 95 115*  BUN 60* 69*  CREATININE 7.61* 11.17*  CALCIUM 10.1 9.4   Liver Function Tests:  Recent Labs Lab 02/20/14 1728  AST 14  ALT 11  ALKPHOS 53  BILITOT 0.5  PROT 7.4  ALBUMIN 4.0   No results for input(s): LIPASE, AMYLASE in the last 168 hours. No results for input(s): AMMONIA in the last 168 hours. CBC:  Recent Labs Lab 02/20/14 1728 02/27/14 0006  WBC 11.8*  14.0*  NEUTROABS 9.2* 9.9*  HGB 10.1* 6.6*  HCT 31.0* 19.7*  MCV 94.8 96.6  PLT 199 244   Cardiac Enzymes: No results for input(s): CKTOTAL, CKMB, CKMBINDEX, TROPONINI in the last 168 hours.  BNP (last 3 results) No results for input(s): BNP in the last 8760 hours.  ProBNP (last 3 results) No results for input(s): PROBNP in the last 8760 hours.  CBG: No results for input(s): GLUCAP in the last 168 hours.  Radiological Exams on Admission: No results found.   Assessment/Plan Principal Problem:   Severe anemia Active Problems:   CKD (chronic kidney disease) stage 5, GFR less than 15 ml/min   Menorrhagia with irregular cycle   Essential hypertension   1. Severe anemia secondary to menorrhagia - at this time 2 units of packed red blood cell transfusion has been ordered. I have also ordered 1 dose of Lasix 40 mg IV since patient has chronic kidney disease and cardiomyopathy. Recheck CBC after transfusion. 2. Menorrhagia - I have discussed with on-call gynecologist Dr. Nehemiah Settle. Dr. Nehemiah Settle has advised to increase patient's Megace from 20 mg twice a day to 40 mg twice a day for 5 days and go back to her regular dose of 20 mg twice a day and follow-up with her primary gynecologist as scheduled on March 4. 3. Chronic kidney disease stage V - continue Lasix I have ordered one dose IV since patient is receiving transfusion. 4. Cardiomyopathy  last year measured was 45-50%  - presently not short of breath. On Lasix. Closely follow intake and output and I have ordered IV Lasix one dose.  5. Hypertension - continue present medications.  DVT Prophylaxis - SCDs.  Code Status:  Full code.  Family Communication:  None.  Disposition Plan:  Admit to inpatient.    Annaya Bangert N. [facility] Pager [phone].  If 7PM-7AM, please contact night-coverage [email or web address] Password [facility] 02/27/2014, 2:44 AM

## 2014-02-27 NOTE — ED Notes (Signed)
Blood consent signed by pt

## 2014-02-27 NOTE — Progress Notes (Signed)
Patient's hemoglobin remains at 7.1 g/dl after 2 units PRBC and she continues to have blood clots. Likely, a hemoglobin will drop further. Will therefore give another unit of blood and await nephrology evaluation-?does she need HD.

## 2014-02-27 NOTE — Progress Notes (Addendum)
I have seen and examined Ms Broom at bedside and reviewed her chart. Marshelia Corse is a pleasant 39 year old female with CK D stage 5 and apparently anemia of chronic kidney disease/essential hypertension/morbid obesity who came in with menorrhagia and worsening anemia. Her Megace was increased per gynecology recommendations and she is getting PRBC transfusion-has received 1 of 2 units planned. However, she had a bump in creatinine from 7.65 to 11.17. I have consulted Dr. Florene Glen who will graciously see patient in consult. Not sure why patient had some leukocytosis-? Reactionary. Please refer to Dr. Moise Boring and his comprehensive H&P and care plan for details. Will also obtain chest x-ray and follow-up UA/urine culture.

## 2014-02-27 NOTE — Progress Notes (Signed)
Utilization review completed. Ferguson Gertner, RN, BSN. 

## 2014-02-27 NOTE — ED Notes (Signed)
Requested hospital bed from Service response/Doris

## 2014-02-27 NOTE — Consult Note (Signed)
Reason for Consult:Renal Failure Referring Physician: Dr. Hal Hope  Chief Complaint: Symptomatic Anemia  Assessment: 1. CKD V - Nearing dialysis but currently without any uremic symptoms. Also still voiding without evidence of hypervolemia or clinical CHF. Acid/base status at goal. 2. Dialysis access - H/O failed right BCF, failed left arm access requiring ligation + brachial artery thrombectomy + patch. Currently right BBF placed appropriately in 2 stages by Dr. Bridgett Larsson is thrombosed. Access had a bruit on 02/26/2014 with Dr. Lorrene Reid. Thrombosis likely secondary to hypotension. 3. Menorrhagia secondary uterine hyperplasia - patient was off Megace, now currently receiving 3rd unit with inappropriate response. May benefit from  gyn evaluation. 4. Relative hypotension - SBP for this patient usually runs in the 150's. 5. Cardiomyopathy. 6. Gout - Not active.  Plan 1.   Holding off on dialysis but certainly may be possible during this hospitalization. Currently she does not have any uremic signs or symptoms. 2.   Will check a iPTH, Phos for renal osteodystrophy management. 3.   Hold on Lasix with the relative hypotension and hypovolemia. 4.   Will request percutaneous thrombectomy of rt arm BBF by VIR. Last known bruit was in the afternoon of 2/23 and likely cause of thrombosis is hypotension. Should not give up on the fistula yet given the extensive surgeries she's had to establish an access (3 accesses to date).   HPI: [name] is an 39 y.o. female with a history of CKD V secondary to hypertension with a baseline creatinine in the 7-9 range who has yet to be initiated on dialysis. She is followed by Dr. Lorrene Reid who saw her yesterday at clinic and noted that the Hb had a precipitous drop to the 6 range. Of note patient was also symptomatic with dizziness, presyncope, malaise but denied any dyspnea, chest pain, nausea or joint pain. She also denied anorexia, dysgeusia or decreased urine output.  She has had worsening menorrhagia since running out of Megace. No travel history or sick contacts. She also denies diarrhea or abdominal pain. No NSAID use.  ROS A comprehensive review of systems was negative except for: HPI  Chemistry and CBC: CREATININE, SER  Date/Time Value Ref Range Status  02/27/2014 12:06 AM 11.17* 0.50 - 1.10 mg/dL Final  02/20/2014 05:28 PM 7.61* 0.50 - 1.10 mg/dL Final  01/30/2013 06:23 AM 6.60* 0.50 - 1.10 mg/dL Final  01/29/2013 05:24 AM 6.59* 0.50 - 1.10 mg/dL Final  01/28/2013 07:10 AM 6.37* 0.50 - 1.10 mg/dL Final  01/28/2013 02:40 AM 6.41* 0.50 - 1.10 mg/dL Final  01/27/2013 09:25 PM 6.42* 0.50 - 1.10 mg/dL Final  01/27/2013 03:09 PM 6.24* 0.50 - 1.10 mg/dL Final  01/27/2013 05:44 AM 6.08* 0.50 - 1.10 mg/dL Final  01/27/2013 01:48 AM 6.50* 0.50 - 1.10 mg/dL Final  01/27/2013 12:59 AM 6.44* 0.50 - 1.10 mg/dL Final  11/27/2010 09:15 AM 3.75* 0.50 - 1.10 mg/dL Final  11/18/2010 01:35 PM 3.60* 0.50 - 1.10 mg/dL Final  03/23/2010 02:54 AM 3.83* 0.4 - 1.2 mg/dL Final  03/06/2008 09:48 AM 2.1* 0.4-1.2 mg/dL Final  02/20/2008 06:30 AM 1.97* 0.4 - 1.2 mg/dL Final  02/19/2008 05:10 AM 2.19* 0.4 - 1.2 mg/dL Final  02/18/2008 04:00 AM 2.59* 0.4 - 1.2 mg/dL Final  02/17/2008 01:50 PM 2.53* 0.4 - 1.2 mg/dL Final  02/17/2008 04:08 AM 2.68* 0.4 - 1.2 mg/dL Final  02/16/2008 02:16 AM 2.3* 0.4 - 1.2 mg/dL Final  10/21/2006 07:24 PM 1.8*  Final    Recent Labs Lab 02/27/14 0006  NA  136  K 3.5  CL 106  CO2 22  GLUCOSE 115*  BUN 69*  CREATININE 11.17*  CALCIUM 9.4    Recent Labs Lab 02/27/14 0006 02/27/14 1412  WBC 14.0*  --   NEUTROABS 9.9*  --   HGB 6.6* 7.3*  HCT 19.7* 21.5*  MCV 96.6  --   PLT 244  --    Liver Function Tests: No results for input(s): AST, ALT, ALKPHOS, BILITOT, PROT, ALBUMIN in the last 168 hours. No results for input(s): LIPASE, AMYLASE in the last 168 hours. No results for input(s): AMMONIA in the last 168 hours. Cardiac  Enzymes: No results for input(s): CKTOTAL, CKMB, CKMBINDEX, TROPONINI in the last 168 hours. Iron Studies: No results for input(s): IRON, TIBC, TRANSFERRIN, FERRITIN in the last 72 hours. PT/INR: '@LABRCNTIP' (inr:5)  Xrays/Other Studies: ) Results for orders placed or performed during the hospital encounter of 02/26/14 (from the past 48 hour(s))  Sample to Blood Bank     Status: None   Collection Time: 02/27/14 12:04 AM  Result Value Ref Range   Blood Bank Specimen SAMPLE AVAILABLE FOR TESTING    Sample Expiration 02/28/2014   Type and screen for Red Blood Exchange     Status: None (Preliminary result)   Collection Time: 02/27/14 12:04 AM  Result Value Ref Range   ABO/RH(D) A POS    Antibody Screen NEG    Sample Expiration 03/02/2014    Unit Number [id number]    Blood Component Type RED CELLS,LR    Unit division 00    Status of Unit ISSUED    Transfusion Status OK TO TRANSFUSE    Crossmatch Result Compatible    Unit Number [id number]    Blood Component Type RED CELLS,LR    Unit division 00    Status of Unit ISSUED    Transfusion Status OK TO TRANSFUSE    Crossmatch Result Compatible    Unit Number [id number]    Blood Component Type RED CELLS,LR    Unit division 00    Status of Unit ISSUED    Transfusion Status OK TO TRANSFUSE    Crossmatch Result Compatible    Unit Number [id number]    Blood Component Type RED CELLS,LR    Unit division 00    Status of Unit ALLOCATED    Transfusion Status OK TO TRANSFUSE    Crossmatch Result Compatible   Basic metabolic panel     Status: Abnormal   Collection Time: 02/27/14 12:06 AM  Result Value Ref Range   Sodium 136 135 - 145 mmol/L   Potassium 3.5 3.5 - 5.1 mmol/L   Chloride 106 96 - 112 mmol/L   CO2 22 19 - 32 mmol/L   Glucose, Bld 115 (H) 70 - 99 mg/dL   BUN 69 (H) 6 - 23 mg/dL   Creatinine, Ser 11.17 (H) 0.50 - 1.10 mg/dL   Calcium 9.4 8.4 - 10.5 mg/dL   GFR calc non Af Amer 4 (L) >90 mL/min   GFR calc Af Amer 4  (L) >90 mL/min    Comment: (NOTE) The eGFR has been calculated using the CKD EPI equation. This calculation has not been validated in all clinical situations. eGFR's persistently <90 mL/min signify possible Chronic Kidney Disease.    Anion gap 8 5 - 15  CBC with Differential     Status: Abnormal   Collection Time: 02/27/14 12:06 AM  Result Value Ref Range   WBC 14.0 (H) 4.0 - 10.5 K/uL   RBC 2.04 (L)  3.87 - 5.11 MIL/uL   Hemoglobin 6.6 (LL) 12.0 - 15.0 g/dL    Comment: REPEATED TO VERIFY CRITICAL RESULT CALLED TO, READ BACK BY AND VERIFIED WITH: J. GAGE RN [phone] [id number] GREEN R    HCT 19.7 (L) 36.0 - 46.0 %   MCV 96.6 78.0 - 100.0 fL   MCH 32.4 26.0 - 34.0 pg   MCHC 33.5 30.0 - 36.0 g/dL   RDW 14.7 11.5 - 15.5 %   Platelets 244 150 - 400 K/uL   Neutrophils Relative % 71 43 - 77 %   Neutro Abs 9.9 (H) 1.7 - 7.7 K/uL   Lymphocytes Relative 21 12 - 46 %   Lymphs Abs 3.0 0.7 - 4.0 K/uL   Monocytes Relative 5 3 - 12 %   Monocytes Absolute 0.7 0.1 - 1.0 K/uL   Eosinophils Relative 3 0 - 5 %   Eosinophils Absolute 0.4 0.0 - 0.7 K/uL   Basophils Relative 0 0 - 1 %   Basophils Absolute 0.0 0.0 - 0.1 K/uL  Pregnancy, urine     Status: None   Collection Time: 02/27/14 12:34 AM  Result Value Ref Range   Preg Test, Ur NEGATIVE NEGATIVE    Comment:        THE SENSITIVITY OF THIS METHODOLOGY IS >20 mIU/mL.   Prepare RBC     Status: None   Collection Time: 02/27/14  1:05 AM  Result Value Ref Range   Order Confirmation ORDER PROCESSED BY BLOOD BANK   Hemoglobin and hematocrit, blood     Status: Abnormal   Collection Time: 02/27/14  2:12 PM  Result Value Ref Range   Hemoglobin 7.3 (L) 12.0 - 15.0 g/dL   HCT 21.5 (L) 36.0 - 46.0 %  Prepare RBC     Status: None   Collection Time: 02/27/14  3:44 PM  Result Value Ref Range   Order Confirmation ORDER PROCESSED BY BLOOD BANK    Dg Chest Port 1 View  02/27/2014   CLINICAL DATA:  Leukocytosis.  EXAM: PORTABLE CHEST - 1 VIEW  COMPARISON:   02/05/2013  FINDINGS: Cardiomegaly with vascular congestion. Bibasilar opacities with low lung volumes. This likely reflects atelectasis. No overt edema or effusions. No acute bony abnormality.  IMPRESSION: Cardiomegaly with vascular congestion.  Low lung volumes with bibasilar atelectasis.   Electronically Signed   By: Rolm Baptise M.D.   On: 02/27/2014 09:48    PMH:   Past Medical History  Diagnosis Date  . Hypertension   . Thyroid disease   . Morbid obesity   . Hyperlipidemia   . History of pneumonia   . Foot fracture   . Dilated cardiomyopathy   . CHF (congestive heart failure)     hosp. 2011  . Gout   . Anxiety     pt not aware of this  . Arthritis   . Anemia   . Menorrhagia   . Chronic kidney disease     not on dialysis at this time  . Encounter for blood transfusion   . History of blood transfusion   . Shortness of breath     seldom  . Pneumonia     hosp. 2010    PSH:   Past Surgical History  Procedure Laterality Date  . Cardiac surgery  1979    repair of hole in heart  . Av fistula placement  08/04/2011    Procedure: ARTERIOVENOUS (AV) FISTULA CREATION;  Surgeon: Elam Dutch, MD;  Location: [place];  Service: Vascular;  Laterality: Right;  Creation right brachiocephalic arteriovenous fistula  . Av fistula placement Left 02/05/2013    Procedure: ARTERIOVENOUS FISTULA CREATION LEFT ARM;  Surgeon: Conrad Motley, MD;  Location: [place];  Service: Vascular;  Laterality: Left;  . Thrombectomy w/ embolectomy Left 03/05/2013    Procedure: THROMBECTOMY OF LEFT BRACHIAL ARTERY;  Surgeon: Conrad Aurora, MD;  Location: [place];  Service: Vascular;  Laterality: Left;  . Ligation of arteriovenous  fistula Left 03/05/2013    Procedure: LIGATION OF ARTERIOVENOUS  FISTULA;  Surgeon: Conrad [place], MD;  Location: [place];  Service: Vascular;  Laterality: Left;  . Patch angioplasty Left 03/05/2013    Procedure: PATCH ANGIOPLASTY;  Surgeon: Conrad Winchester, MD;  Location: [place];  Service: Vascular;   Laterality: Left;  . Av fistula placement Right 06/04/2013    Procedure: ARTERIOVENOUS (AV) FISTULA CREATION;  Surgeon: Conrad Pattison, MD;  Location: [place];  Service: Vascular;  Laterality: Right;  . Bascilic vein transposition Right 08/13/2013    Procedure: Second Stage Brachial Vein Transposition;  Surgeon: Conrad [place], MD;  Location: [place];  Service: Vascular;  Laterality: Right;    Allergies: No Known Allergies  Medications:   Prior to Admission medications   Medication Sig Start Date End Date Taking? Authorizing Provider  allopurinol (ZYLOPRIM) 100 MG tablet Take 100 mg by mouth 2 (two) times daily.    Yes Historical Provider, MD  carvedilol (COREG) 25 MG tablet Take 25 mg by mouth 2 (two) times daily with a meal.   Yes Historical Provider, MD  ferrous sulfate 325 (65 FE) MG EC tablet Take 1 tablet (325 mg total) by mouth 2 (two) times daily. 04/25/13  Yes Myra Marijo Sanes, MD  furosemide (LASIX) 80 MG tablet Take 80 mg by mouth 2 (two) times daily.   Yes Historical Provider, MD  hydrALAZINE (APRESOLINE) 100 MG tablet Take 50 mg by mouth 3 (three) times daily.  05/30/13  Yes Historical Provider, MD  megestrol (MEGACE) 20 MG tablet Take 1 tablet (20 mg total) by mouth 2 (two) times daily. 02/20/14  Yes Garald Balding, NP  oxyCODONE-acetaminophen (PERCOCET/ROXICET) 5-325 MG per tablet Take 1 tablet by mouth every 6 (six) hours as needed for severe pain. 02/20/14  Yes Garald Balding, NP  pravastatin (PRAVACHOL) 40 MG tablet Take 40 mg by mouth daily.  06/01/13  Yes Historical Provider, MD  RENVELA 800 MG tablet Take 1,600 mg by mouth 3 (three) times daily with meals.  09/04/13  Yes Historical Provider, MD  calcitRIOL (ROCALTROL) 0.25 MCG capsule Take 0.25 mcg by mouth 3 (three) times a week. Monday Wednesday and Friday    Historical Provider, MD  Calcium Carb-Cholecalciferol (CALCIUM 500 +D PO) Take 3 tablets by mouth daily.    Historical Provider, MD  HYDROcodone-acetaminophen (NORCO) 5-325 MG per tablet  Take 2 tablets by mouth every 4 (four) hours as needed. Patient not taking: Reported on 02/26/2014 11/10/13   Pura Spice, PA-C    Discontinued Meds:   Medications Discontinued During This Encounter  Medication Reason  . 0.9 %  sodium chloride infusion     Social History:  reports that she has never smoked. She has never used smokeless tobacco. She reports that she does not drink alcohol or use illicit drugs.  Family History:   Family History  Problem Relation Age of Onset  . Diabetes Mother   . Hyperlipidemia Mother   . Hypertension Mother   .  Kidney disease Mother   . Anesthesia problems Neg Hx   . Hypotension Neg Hx   . Malignant hyperthermia Neg Hx   . Pseudochol deficiency Neg Hx     Blood pressure 113/58, pulse 80, temperature 98.4 F (36.9 C), temperature source Oral, resp. rate 18, height '5\' 9"'  (1.753 m), weight 144.652 kg (318 lb 14.4 oz), last menstrual period 02/26/2014, SpO2 100 %. General appearance: alert, cooperative and no distress Head: Normocephalic, without obvious abnormality, atraumatic Eyes: negative, conjunctivae/corneas clear. PERRL, EOM's intact. Fundi benign. Throat: normal findings: gums healthy, teeth intact, non-carious and palate normal Neck: no adenopathy, no carotid bruit and supple, symmetrical, trachea midline Back: negative, symmetric, no curvature. ROM normal. No CVA tenderness., vertical scar well healed Resp: clear to auscultation bilaterally Chest wall: no tenderness Cardio: regular rate and rhythm, S1, S2 normal, no murmur, click, rub or gallop GI: soft, non-tender; bowel sounds normal; no masses,  no organomegaly Extremities: extremities normal, atraumatic, no cyanosis or edema Pulses: 2+ and symmetric Skin: Skin color, texture, turgor normal. No rashes or lesions       Chala Gul, Hunt Oris, MD 02/27/2014, 5:35 PM

## 2014-02-28 ENCOUNTER — Inpatient Hospital Stay (HOSPITAL_COMMUNITY): Payer: BLUE CROSS/BLUE SHIELD

## 2014-02-28 DIAGNOSIS — D72829 Elevated white blood cell count, unspecified: Secondary | ICD-10-CM | POA: Diagnosis present

## 2014-02-28 DIAGNOSIS — T8249XA Other complication of vascular dialysis catheter, initial encounter: Secondary | ICD-10-CM | POA: Diagnosis present

## 2014-02-28 LAB — COMPREHENSIVE METABOLIC PANEL
ALBUMIN: 3 g/dL — AB (ref 3.5–5.2)
ALT: 10 U/L (ref 0–35)
ANION GAP: 13 (ref 5–15)
AST: 13 U/L (ref 0–37)
Alkaline Phosphatase: 40 U/L (ref 39–117)
BUN: 72 mg/dL — ABNORMAL HIGH (ref 6–23)
CO2: 22 mmol/L (ref 19–32)
Calcium: 9.2 mg/dL (ref 8.4–10.5)
Chloride: 104 mmol/L (ref 96–112)
Creatinine, Ser: 11.79 mg/dL — ABNORMAL HIGH (ref 0.50–1.10)
GFR calc Af Amer: 4 mL/min — ABNORMAL LOW (ref >90)
GFR calc non Af Amer: 4 mL/min — ABNORMAL LOW (ref >90)
Glucose, Bld: 88 mg/dL (ref 70–99)
Potassium: 3.6 mmol/L (ref 3.5–5.1)
Sodium: 139 mmol/L (ref 135–145)
TOTAL PROTEIN: 5.6 g/dL — AB (ref 6.0–8.3)
Total Bilirubin: 0.8 mg/dL (ref 0.3–1.2)

## 2014-02-28 LAB — CBC WITH DIFFERENTIAL/PLATELET
BASOS PCT: 0 % (ref 0–1)
Basophils Absolute: 0 10*3/uL (ref 0.0–0.1)
EOS ABS: 0.4 10*3/uL (ref 0.0–0.7)
Eosinophils Relative: 3 % (ref 0–5)
HCT: 21.3 % — ABNORMAL LOW (ref 36.0–46.0)
Hemoglobin: 7.1 g/dL — ABNORMAL LOW (ref 12.0–15.0)
LYMPHS ABS: 3.7 10*3/uL (ref 0.7–4.0)
Lymphocytes Relative: 27 % (ref 12–46)
MCH: 30.7 pg (ref 26.0–34.0)
MCHC: 33.3 g/dL (ref 30.0–36.0)
MCV: 92.2 fL (ref 78.0–100.0)
Monocytes Absolute: 0.9 10*3/uL (ref 0.1–1.0)
Monocytes Relative: 6 % (ref 3–12)
NEUTROS PCT: 63 % (ref 43–77)
Neutro Abs: 8.6 10*3/uL — ABNORMAL HIGH (ref 1.7–7.7)
Platelets: 187 10*3/uL (ref 150–400)
RBC: 2.31 MIL/uL — ABNORMAL LOW (ref 3.87–5.11)
RDW: 16.5 % — ABNORMAL HIGH (ref 11.5–15.5)
WBC: 13.6 10*3/uL — ABNORMAL HIGH (ref 4.0–10.5)

## 2014-02-28 LAB — PROTIME-INR
INR: 1.21 (ref 0.00–1.49)
Prothrombin Time: 15.5 seconds — ABNORMAL HIGH (ref 11.6–15.2)

## 2014-02-28 LAB — PREPARE RBC (CROSSMATCH)

## 2014-02-28 LAB — APTT: aPTT: 26 seconds (ref 24–37)

## 2014-02-28 MED ORDER — SODIUM CHLORIDE 0.9 % IV SOLN
Freq: Once | INTRAVENOUS | Status: AC
  Administered 2014-02-28: 10 mL/h via INTRAVENOUS

## 2014-02-28 NOTE — Progress Notes (Signed)
Assessment: 1. CKD V - Nearing dialysis but currently without any uremic symptoms. Also still voiding without evidence of hypervolemia or clinical CHF. Acid/base status at goal. 2. Dialysis access - H/O failed right BCF, failed left arm access requiring ligation + brachial artery thrombectomy + patch. Currently right BBF placed appropriately in 2 stages by Dr. Bridgett Larsson is thrombosed. Access had a bruit on 02/26/2014 with Dr. Lorrene Reid. Thrombosis likely secondary to hypotension. 3. Menorrhagia secondary uterine hyperplasia with severe Anemia -  4. Relative hypotension - SBP for this patient usually runs in the 150's. 5. Cardiomyopathy.  Subjective: Interval History: No uremic symptoms  Objective: Vital signs in last 24 hours: Temp:  [98.4 F (36.9 C)-99.4 F (37.4 C)] 98.6 F (37 C) (02/25 0548) Pulse Rate:  [78-84] 81 (02/25 0548) Resp:  [16-20] 20 (02/25 0548) BP: (100-159)/(52-107) 100/52 mmHg (02/25 0548) SpO2:  [99 %-100 %] 99 % (02/25 0548) Weight:  [143.2 kg (315 lb 11.2 oz)] 143.2 kg (315 lb 11.2 oz) (02/25 0548) Weight change: -3.765 kg (-8 lb 4.8 oz)  Intake/Output from previous day: 02/24 0701 - 02/25 0700 In: 698 [P.O.:360; I.V.:3; Blood:335] Out: -  Intake/Output this shift:    General appearance: alert and cooperative Extremities: extremities normal, atraumatic, no cyanosis or edema and AV access RUE with no thrill  Lab Results:  Recent Labs  02/27/14 0006 02/27/14 1412 02/28/14 0444  WBC 14.0*  --  13.6*  HGB 6.6* 7.3* 7.1*  HCT 19.7* 21.5* 21.3*  PLT 244  --  187   BMET:  Recent Labs  02/27/14 0006 02/28/14 0444  NA 136 139  K 3.5 3.6  CL 106 104  CO2 22 22  GLUCOSE 115* 88  BUN 69* 72*  CREATININE 11.17* 11.79*  CALCIUM 9.4 9.2   No results for input(s): PTH in the last 72 hours. Iron Studies: No results for input(s): IRON, TIBC, TRANSFERRIN, FERRITIN in the last 72 hours. Studies/Results: Dg Chest Port 1 View  02/27/2014   CLINICAL DATA:   Leukocytosis.  EXAM: PORTABLE CHEST - 1 VIEW  COMPARISON:  02/05/2013  FINDINGS: Cardiomegaly with vascular congestion. Bibasilar opacities with low lung volumes. This likely reflects atelectasis. No overt edema or effusions. No acute bony abnormality.  IMPRESSION: Cardiomegaly with vascular congestion.  Low lung volumes with bibasilar atelectasis.   Electronically Signed   By: Rolm Baptise M.D.   On: 02/27/2014 09:48   Scheduled: . allopurinol  100 mg Oral BID  . carvedilol  25 mg Oral BID WC  . ferrous sulfate  325 mg Oral BID WC  . furosemide  80 mg Oral BID  . hydrALAZINE  50 mg Oral 3 times per day  . megestrol  40 mg Oral BID  . pravastatin  40 mg Oral q1800  . sevelamer carbonate  1,600 mg Oral TID WC  . sodium chloride  3 mL Intravenous Q12H     LOS: 1 day   Conny Moening C 02/28/2014,8:37 AM

## 2014-02-28 NOTE — Progress Notes (Signed)
PATIENT RETURNED TO TELE UNIT (VIA WHEELCHAIR WITH RN) FROM VISITING MOTHER ON 53M.

## 2014-02-28 NOTE — Progress Notes (Signed)
[facility] PROGRESS NOTE  [name] [id number] DOB: Apr 16, 1975 DOA: 02/26/2014 PCP: Pcp Not In System  Summary [facility]. [name] is a pleasant 39 year old female with CKD stage 5 and apparently anemia of chronic kidney disease/essential hypertension/morbid obesity BMI>40, who came in with menorrhagia and worsening anemia. Her Megace was increased per gynecology recommendations and she received 3 units PRBC and her hemoglobin barely budged, rising from a nidr of 6.6g/dl to 7.1g/dl as she continues to have blood clots which are apparently smaller in size compared to when she came in. She also had a bump in creatinine from 7.65 to 11.17, although she is not uremic with question of whether hypotension contributed. Patient had some leukocytosis-? Reactionary as no other evidence to suggest infection. Her chest x-ray showed "Cardiomegaly with vascular congestion. Low lung volumes with bibasilar atelectasis". UA/urine culture is pending. Her AV fistula apparently clotted. Will defer to nephrology for management of CK D. Meanwhile, will continue Megace and transfuse 2 more units of PRBC as patient continues to bleed. She may need Lasix if she gets into fluid overload. Await UA/urine culture. Plan Severe anemia/Menorrhagia with irregular cycle  Transfuse 2 units PRBC  Continue Megace per gynecology  May need Lasix posttransfusion CKD (chronic kidney disease) stage 5, GFR less than 15 ml/min  Defer to nephrology  For declotting of AV fistula Essential hypertension  Borderline low blood pressure  Monitor Leukocytosis  Monitor for evidence of infection  Follow UA/urine culture. Code Status: Full Family Communication: No family members at bedside Disposition Plan: Eventually home   Consultants:  Gynecology  Nephrology  Procedures:  For declotting of fistula  Antibiotics:  None  HPI/Subjective: Still has blood clots, and cramping abdominal  pain  Objective: Filed Vitals:   02/28/14 0700  BP: 115/56  Pulse: 75  Temp: 97.9 F (36.6 C)  Resp:     Intake/Output Summary (Last 24 hours) at 02/28/14 0858 Last data filed at 02/27/14 2231  Gross per 24 hour  Intake    578 ml  Output      0 ml  Net    578 ml   Filed Weights   02/26/14 2331 02/27/14 0206 02/28/14 0548  Weight: 146.965 kg (324 lb) 144.652 kg (318 lb 14.4 oz) 143.2 kg (315 lb 11.2 oz)    Exam:   General:  Comfortable at rest.  Cardiovascular: S1-S2 normal. No murmurs. Pulse regular.  Respiratory: Good air entry bilaterally. No rhonchi or rales.  Abdomen: Soft and nontender. Normal bowel sounds. No organomegaly.  Musculoskeletal: No pedal edema   Neurological: Intact  Data Reviewed: Basic Metabolic Panel:  Recent Labs Lab 02/27/14 0006 02/28/14 0444  NA 136 139  K 3.5 3.6  CL 106 104  CO2 22 22  GLUCOSE 115* 88  BUN 69* 72*  CREATININE 11.17* 11.79*  CALCIUM 9.4 9.2   Liver Function Tests:  Recent Labs Lab 02/28/14 0444  AST 13  ALT 10  ALKPHOS 40  BILITOT 0.8  PROT 5.6*  ALBUMIN 3.0*   No results for input(s): LIPASE, AMYLASE in the last 168 hours. No results for input(s): AMMONIA in the last 168 hours. CBC:  Recent Labs Lab 02/27/14 0006 02/27/14 1412 02/28/14 0444  WBC 14.0*  --  13.6*  NEUTROABS 9.9*  --  8.6*  HGB 6.6* 7.3* 7.1*  HCT 19.7* 21.5* 21.3*  MCV 96.6  --  92.2  PLT 244  --  187   Cardiac Enzymes: No results for input(s): CKTOTAL, CKMB, CKMBINDEX,  TROPONINI in the last 168 hours. BNP (last 3 results) No results for input(s): BNP in the last 8760 hours.  ProBNP (last 3 results) No results for input(s): PROBNP in the last 8760 hours.  CBG: No results for input(s): GLUCAP in the last 168 hours.  No results found for this or any previous visit (from the past 240 hour(s)).   Studies: Dg Chest Port 1 View  02/27/2014   CLINICAL DATA:  Leukocytosis.  EXAM: PORTABLE CHEST - 1 VIEW  COMPARISON:   02/05/2013  FINDINGS: Cardiomegaly with vascular congestion. Bibasilar opacities with low lung volumes. This likely reflects atelectasis. No overt edema or effusions. No acute bony abnormality.  IMPRESSION: Cardiomegaly with vascular congestion.  Low lung volumes with bibasilar atelectasis.   Electronically Signed   By: Rolm Baptise M.D.   On: 02/27/2014 09:48    Scheduled Meds: . sodium chloride   Intravenous Once  . allopurinol  100 mg Oral BID  . carvedilol  25 mg Oral BID WC  . ferrous sulfate  325 mg Oral BID WC  . furosemide  80 mg Oral BID  . hydrALAZINE  50 mg Oral 3 times per day  . megestrol  40 mg Oral BID  . pravastatin  40 mg Oral q1800  . sevelamer carbonate  1,600 mg Oral TID WC  . sodium chloride  3 mL Intravenous Q12H   Continuous Infusions:    Time spent: 25 minutes    [name]  [facility] Pager [phone]. If 7PM-7AM, please contact night-coverage at [email or web address], password [facility] 02/28/2014, 8:58 AM  LOS: 1 day

## 2014-02-28 NOTE — Consult Note (Signed)
Chief Complaint: Chief Complaint  Patient presents with  . Abnormal labs   . Weakness  Nearing dialysis  Referring Physician(s): Dr Florene Glen  History of Present Illness: [name] is a 39 y.o. female   Pt admitted with hypotension and anemia secondary menorrhagia Pt has known renal disease with Right dialysis fistula placed in preparation for dialysis -- Dr Bridgett Larsson placed 08/2013 Has not had to use dialysis as of yet. Worsening renal function: Bun/Cr 72/11.79 Pt is without uremic symptoms and still voiding well Noted no pulse in Rt arm fistula yesterday Dr Lorrene Reid felt pulse 2/23 per chart Dr Florene Glen has requested thrombolysis with possible angioplasty/stent of Rt upper dialysis access as pt is nearing need for dialysis I have seen and examined pt  Dr Florene Glen says no need for catheter at this time  Past Medical History  Diagnosis Date  . Hypertension   . Thyroid disease   . Morbid obesity   . Hyperlipidemia   . History of pneumonia   . Foot fracture   . Dilated cardiomyopathy   . CHF (congestive heart failure)     hosp. 2011  . Gout   . Anxiety     pt not aware of this  . Arthritis   . Anemia   . Menorrhagia   . Chronic kidney disease     not on dialysis at this time  . Encounter for blood transfusion   . History of blood transfusion   . Shortness of breath     seldom  . Pneumonia     hosp. 2010    Past Surgical History  Procedure Laterality Date  . Cardiac surgery  1979    repair of hole in heart  . Av fistula placement  08/04/2011    Procedure: ARTERIOVENOUS (AV) FISTULA CREATION;  Surgeon: Elam Dutch, MD;  Location: [facility] OR;  Service: Vascular;  Laterality: Right;  Creation right brachiocephalic arteriovenous fistula  . Av fistula placement Left 02/05/2013    Procedure: ARTERIOVENOUS FISTULA CREATION LEFT ARM;  Surgeon: Conrad [place], MD;  Location: [place];  Service: Vascular;  Laterality: Left;  . Thrombectomy w/ embolectomy Left 03/05/2013   Procedure: THROMBECTOMY OF LEFT BRACHIAL ARTERY;  Surgeon: Conrad [place], MD;  Location: [place];  Service: Vascular;  Laterality: Left;  . Ligation of arteriovenous  fistula Left 03/05/2013    Procedure: LIGATION OF ARTERIOVENOUS  FISTULA;  Surgeon: Conrad [place], MD;  Location: [place];  Service: Vascular;  Laterality: Left;  . Patch angioplasty Left 03/05/2013    Procedure: PATCH ANGIOPLASTY;  Surgeon: Conrad [place], MD;  Location: [place];  Service: Vascular;  Laterality: Left;  . Av fistula placement Right 06/04/2013    Procedure: ARTERIOVENOUS (AV) FISTULA CREATION;  Surgeon: Conrad [place], MD;  Location: [place];  Service: Vascular;  Laterality: Right;  . Bascilic vein transposition Right 08/13/2013    Procedure: Second Stage Brachial Vein Transposition;  Surgeon: Conrad [place], MD;  Location: [place];  Service: Vascular;  Laterality: Right;    Allergies: Review of patient's allergies indicates no known allergies.  Medications: Prior to Admission medications   Medication Sig Start Date End Date Taking? Authorizing Provider  allopurinol (ZYLOPRIM) 100 MG tablet Take 100 mg by mouth 2 (two) times daily.    Yes Historical Provider, MD  carvedilol (COREG) 25 MG tablet Take 25 mg by mouth 2 (two) times daily with a meal.   Yes Historical Provider, MD  ferrous sulfate 325 (  65 FE) MG EC tablet Take 1 tablet (325 mg total) by mouth 2 (two) times daily. 04/25/13  Yes Myra Marijo Sanes, MD  furosemide (LASIX) 80 MG tablet Take 80 mg by mouth 2 (two) times daily.   Yes Historical Provider, MD  hydrALAZINE (APRESOLINE) 100 MG tablet Take 50 mg by mouth 3 (three) times daily.  05/30/13  Yes Historical Provider, MD  megestrol (MEGACE) 20 MG tablet Take 1 tablet (20 mg total) by mouth 2 (two) times daily. 02/20/14  Yes Garald Balding, NP  oxyCODONE-acetaminophen (PERCOCET/ROXICET) 5-325 MG per tablet Take 1 tablet by mouth every 6 (six) hours as needed for severe pain. 02/20/14  Yes Garald Balding, NP  pravastatin (PRAVACHOL) 40 MG  tablet Take 40 mg by mouth daily.  06/01/13  Yes Historical Provider, MD  RENVELA 800 MG tablet Take 1,600 mg by mouth 3 (three) times daily with meals.  09/04/13  Yes Historical Provider, MD  calcitRIOL (ROCALTROL) 0.25 MCG capsule Take 0.25 mcg by mouth 3 (three) times a week. Monday Wednesday and Friday    Historical Provider, MD  Calcium Carb-Cholecalciferol (CALCIUM 500 +D PO) Take 3 tablets by mouth daily.    Historical Provider, MD  HYDROcodone-acetaminophen (NORCO) 5-325 MG per tablet Take 2 tablets by mouth every 4 (four) hours as needed. Patient not taking: Reported on 02/26/2014 11/10/13   Pura Spice, PA-C     Family History  Problem Relation Age of Onset  . Diabetes Mother   . Hyperlipidemia Mother   . Hypertension Mother   . Kidney disease Mother   . Anesthesia problems Neg Hx   . Hypotension Neg Hx   . Malignant hyperthermia Neg Hx   . Pseudochol deficiency Neg Hx     History   Social History  . Marital Status: Single    Spouse Name: N/A  . Number of Children: N/A  . Years of Education: N/A   Social History Main Topics  . Smoking status: Never Smoker   . Smokeless tobacco: Never Used  . Alcohol Use: No  . Drug Use: No  . Sexual Activity: Yes    Birth Control/ Protection: None     Comment: no birth control    Other Topics Concern  . None   Social History Narrative      Review of Systems: A 12 point ROS discussed and pertinent positives are indicated in the HPI above.  All other systems are negative.  Review of Systems  Respiratory: Negative for cough and shortness of breath.   Cardiovascular: Negative for chest pain.  Genitourinary: Positive for menstrual problem. Negative for difficulty urinating.  Neurological: Positive for weakness.  Psychiatric/Behavioral: Negative for behavioral problems and confusion.    Vital Signs: BP 115/56 mmHg  Pulse 75  Temp(Src) 97.9 F (36.6 C) (Oral)  Resp 20  Ht 5\' 9"  (1.753 m)  Wt 143.2 kg (315 lb 11.2 oz)   BMI 46.60 kg/m2  SpO2 98%  LMP 02/26/2014  Physical Exam  Constitutional: She is oriented to person, place, and time.  Obese   Cardiovascular: Normal rate and regular rhythm.   No murmur heard. Pulmonary/Chest: Effort normal and breath sounds normal. She has no wheezes.  Abdominal: Soft. Bowel sounds are normal.  Musculoskeletal: Normal range of motion.  Rt upper arm dialysis access - no thrill no pulse  Neurological: She is alert and oriented to person, place, and time.  Skin: Skin is warm and dry.  Psychiatric: She has a normal mood and  affect. Her behavior is normal. Judgment and thought content normal.  Nursing note and vitals reviewed.   Mallampati Score:  MD Evaluation Airway: WNL Heart: WNL Abdomen: WNL Chest/ Lungs: WNL ASA  Classification: 3 Mallampati/Airway Score: One  Imaging: Dg Chest Port 1 View  02/27/2014   CLINICAL DATA:  Leukocytosis.  EXAM: PORTABLE CHEST - 1 VIEW  COMPARISON:  02/05/2013  FINDINGS: Cardiomegaly with vascular congestion. Bibasilar opacities with low lung volumes. This likely reflects atelectasis. No overt edema or effusions. No acute bony abnormality.  IMPRESSION: Cardiomegaly with vascular congestion.  Low lung volumes with bibasilar atelectasis.   Electronically Signed   By: Rolm Baptise M.D.   On: 02/27/2014 09:48    Labs:  CBC:  Recent Labs  02/20/14 1728 02/27/14 0006 02/27/14 1412 02/28/14 0444  WBC 11.8* 14.0*  --  13.6*  HGB 10.1* 6.6* 7.3* 7.1*  HCT 31.0* 19.7* 21.5* 21.3*  PLT 199 244  --  187    COAGS: No results for input(s): INR, APTT in the last 8760 hours.  BMP:  Recent Labs  08/13/13 0905 02/20/14 1728 02/27/14 0006 02/28/14 0444  NA 142 140 136 139  K 3.8 4.4 3.5 3.6  CL  --  108 106 104  CO2  --  24 22 22   GLUCOSE 87 95 115* 88  BUN  --  60* 69* 72*  CALCIUM  --  10.1 9.4 9.2  CREATININE  --  7.61* 11.17* 11.79*  GFRNONAA  --  6* 4* 4*  GFRAA  --  7* 4* 4*    LIVER FUNCTION  TESTS:  Recent Labs  02/20/14 1728 02/28/14 0444  BILITOT 0.5 0.8  AST 14 13  ALT 11 10  ALKPHOS 53 40  PROT 7.4 5.6*  ALBUMIN 4.0 3.0*    TUMOR MARKERS: No results for input(s): AFPTM, CEA, CA199, CHROMGRNA in the last 8760 hours.  Assessment and Plan:  Rt arm dialysis access clotted Has not ever used dialysis as of yet Nearing need per Dr Florene Glen Access clotted---last pulse palpated 2/23 Scheduled now for thrombolysis with possible angioplasty/stent placement No need for dialysis catheter at this time per Dr Florene Glen Pt aware of procedure benefits and risks including but not limited to Infection; bleeding; PE; vessel damage Agreeable to proceed Consent signed andin chart  Pt actively menstruating   Thank you for this interesting consult.  I greatly enjoyed meeting [name] and look forward to participating in their care.  Signed: Kanae Ignatowski A 02/28/2014, 9:55 AM   I spent a total of 20 Minutes  in face to face in clinical consultation, greater than 50% of which was counseling/coordinating care for Rt arm dialysis access declot

## 2014-02-28 NOTE — Progress Notes (Signed)
PER REQUEST, PATIENT TAKEN TO ICU TO VISIT MOTHER. PATIENT LEFT UNDER SUPERVISION OF 65M. PATIENT IS STABLE AND DENIES ANY COMPLAINTS OR DISCOMFORT.

## 2014-03-01 LAB — TYPE AND SCREEN
ABO/RH(D): A POS
Antibody Screen: NEGATIVE
UNIT DIVISION: 0
UNIT DIVISION: 0
UNIT DIVISION: 0
Unit division: 0
Unit division: 0

## 2014-03-01 LAB — BASIC METABOLIC PANEL
ANION GAP: 10 (ref 5–15)
BUN: 77 mg/dL — ABNORMAL HIGH (ref 6–23)
CO2: 21 mmol/L (ref 19–32)
Calcium: 8.6 mg/dL (ref 8.4–10.5)
Chloride: 103 mmol/L (ref 96–112)
Creatinine, Ser: 11.98 mg/dL — ABNORMAL HIGH (ref 0.50–1.10)
GFR calc Af Amer: 4 mL/min — ABNORMAL LOW (ref >90)
GFR, EST NON AFRICAN AMERICAN: 3 mL/min — AB (ref >90)
GLUCOSE: 98 mg/dL (ref 70–99)
POTASSIUM: 3.5 mmol/L (ref 3.5–5.1)
Sodium: 134 mmol/L — ABNORMAL LOW (ref 135–145)

## 2014-03-01 LAB — CBC
HEMATOCRIT: 24.3 % — AB (ref 36.0–46.0)
Hemoglobin: 8.2 g/dL — ABNORMAL LOW (ref 12.0–15.0)
MCH: 30.5 pg (ref 26.0–34.0)
MCHC: 33.7 g/dL (ref 30.0–36.0)
MCV: 90.3 fL (ref 78.0–100.0)
Platelets: 182 10*3/uL (ref 150–400)
RBC: 2.69 MIL/uL — AB (ref 3.87–5.11)
RDW: 16 % — ABNORMAL HIGH (ref 11.5–15.5)
WBC: 13.9 10*3/uL — AB (ref 4.0–10.5)

## 2014-03-01 NOTE — Progress Notes (Signed)
Patient ID: [name], female   DOB: April 26, 1975, 39 y.o.   MRN: [id number]   Pt was scheduled originally for R upper extremity dialysis access thrombolysis 2/25  Unable to perform procedure that day. Dr Vernard Gambles felt too riskful with known anemia Several transfusions - even on that day  Pt now has minimal menses No transfusions since 2/25 hgb 8.2 Dr watts feels would be safe to perform tomorrow am  Now scheduled for 2/27 am for procedure She is aware  If pt is discharged from hospital this evening She knows she is to be back to [facility] at 900 am 2/27 for procedure as OP

## 2014-03-01 NOTE — Discharge Summary (Addendum)
[name], is a 39 y.o. female  DOB 17-Oct-1975  MRN [id number].  Admission date:  02/26/2014  Admitting Physician  Rise Patience, MD  Discharge Date:  03/01/2014   Primary MD  Pcp Not In System  Recommendations for primary care physician for things to follow:  Please follow hemoglobin/Renal function.   Admission Diagnosis   End stage renal disease [N18.6] Menorrhagia with irregular cycle [N92.1] Symptomatic anemia [D64.9]   Discharge Diagnosis  End stage renal disease [N18.6] Menorrhagia with irregular cycle [N92.1] Symptomatic anemia [D64.9]   Principal Problem:   Severe anemia Active Problems:   Menorrhagia with irregular cycle   OBESITY-MORBID (>100')   CKD (chronic kidney disease) stage 5, GFR less than 15 ml/min   Essential hypertension   Leucocytosis   Clotted dialysis access      Hospital Course  [name] is a pleasant 39 year old female with CKD stage 5 and apparently anemia of chronic kidney disease/essential hypertension/morbid obesity BMI>40, who came in with menorrhagia and worsening anemia. Her Megace was increased per gynecology recommendations and she received 3 units PRBC and her hemoglobin barely budged, rising from a nidr of 6.6g/dl to 7.1g/dl as she continued to have blood clots which have gradually become smaller in size compared to when she came in. She also had a bump in creatinine from 7.65 to 11.17, although she is not uremic with question of whether hypotension contributed. Patient had some leukocytosis-? Reactionary as no other evidence to suggest infection. Her chest x-ray showed "Cardiomegaly with vascular congestion. Low lung volumes with bibasilar atelectasis". UA/urine culture was not done because of the ongoing vaginal bleeding.Marland Kitchen Her AV fistula apparently  clotted and she is to have declotting done by interventional radiology on 03/02/2014. Nephrology is managing of CKD, and feel that she is not yet at a point where she needs dialysis. She received 2 more units of PRBC with improvement of her hemoglobin to 8.2 g/dl. Declotting was delayed because of ongoing menorrhagia as she would need thrombolytics for the procedure and this would worsen her bleeding. Patient is very anxious to go home today because her mom is critically ill and is now on comfort measures. I'm concerned that she may have further drop in hemoglobin. However, it seems that Megace is helping. She should continue the Megace as recommended by gynecology and follow with nephrology(discussed with Dr Powell)/interventional radiology/gynecology as scheduled. She should continue ferrous sulfate and Procrit equivalents as before.   Discharge Condition Stable.  Consults obtained  Nephrology Interventional radiology Gynecology  Follow UP Nephrology Interventional radiology Gynecology   Discharge Instructions  and  Discharge Medications  Discharge Instructions    Diet - low sodium heart healthy    Complete by:  As directed      Increase activity slowly    Complete by:  As directed             Medication List    STOP taking these medications        oxyCODONE-acetaminophen 5-325 MG per tablet  Commonly known as:  PERCOCET/ROXICET      TAKE these medications        allopurinol 100 MG tablet  Commonly known as:  ZYLOPRIM  Take 100 mg by mouth 2 (two) times daily.     calcitRIOL 0.25 MCG capsule  Commonly known as:  ROCALTROL  Take 0.25 mcg by mouth 3 (three) times a week. Monday Wednesday and Friday     CALCIUM 500 +D PO  Take 3 tablets by mouth daily.     carvedilol 25 MG tablet  Commonly known as:  COREG  Take 25 mg by mouth 2 (two) times daily with a meal.     ferrous sulfate 325 (65 FE) MG EC tablet  Take 1 tablet (325 mg total) by mouth 2 (two) times daily.      furosemide 80 MG tablet  Commonly known as:  LASIX  Take 80 mg by mouth 2 (two) times daily.     hydrALAZINE 100 MG tablet  Commonly known as:  APRESOLINE  Take 50 mg by mouth 3 (three) times daily.     HYDROcodone-acetaminophen 5-325 MG per tablet  Commonly known as:  NORCO  Take 2 tablets by mouth every 4 (four) hours as needed.     megestrol 20 MG tablet  Commonly known as:  MEGACE  Take 1 tablet (20 mg total) by mouth 2 (two) times daily.     pravastatin 40 MG tablet  Commonly known as:  PRAVACHOL  Take 40 mg by mouth daily.     RENVELA 800 MG tablet  Generic drug:  sevelamer carbonate  Take 1,600 mg by mouth 3 (three) times daily with meals.        Diet and Activity recommendation: See Discharge Instructions above  Major procedures and Radiology Reports - PLEASE review detailed and final reports for all details, in brief -    Dg Chest [facility] 1 View  02/27/2014   CLINICAL DATA:  Leukocytosis.  EXAM: PORTABLE CHEST - 1 VIEW  COMPARISON:  02/05/2013  FINDINGS: Cardiomegaly with vascular congestion. Bibasilar opacities with low lung volumes. This likely reflects atelectasis. No overt edema or effusions. No acute bony abnormality.  IMPRESSION: Cardiomegaly with vascular congestion.  Low lung volumes with bibasilar atelectasis.   Electronically Signed   By: Rolm Baptise M.D.   On: 02/27/2014 09:48    Micro Results   No results found for this or any previous visit (from the past 240 hour(s)).     Today   Subjective:   [name] today has no headache,no chest abdominal pain,no new weakness tingling or numbness, feels much better wants to go home today.   Objective:   Blood pressure 151/105, pulse 84, temperature 97.8 F (36.6 C), temperature source Oral, resp. rate 19, height 5\' 9"  (1.753 m), weight 143.8 kg (317 lb 0.3 oz), last menstrual period 02/26/2014, SpO2 100 %.   Intake/Output Summary (Last 24 hours) at 03/01/14 1649 Last data filed at 03/01/14  0830  Gross per 24 hour  Intake    705 ml  Output      0 ml  Net    705 ml    Exam Awake Alert, Oriented x 3, No new F.N deficits, Normal affect [place].AT,PERRAL Supple Neck,No JVD, No cervical lymphadenopathy appriciated.  Symmetrical Chest wall movement, Good air movement bilaterally, CTAB RRR,No Gallops,Rubs or new Murmurs, No Parasternal Heave +ve B.Sounds, Abd Soft, Non tender, No organomegaly appriciated, No rebound -guarding or rigidity. No Cyanosis, Clubbing or edema, No  new Rash or bruise  Data Review   CBC w Diff: Lab Results  Component Value Date   WBC 13.9* 03/01/2014   HGB 8.2* 03/01/2014   HCT 24.3* 03/01/2014   PLT 182 03/01/2014   LYMPHOPCT 27 02/28/2014   MONOPCT 6 02/28/2014   EOSPCT 3 02/28/2014   BASOPCT 0 02/28/2014    CMP: Lab Results  Component Value Date   NA 134* 03/01/2014   K 3.5 03/01/2014   CL 103 03/01/2014   CO2 21 03/01/2014   BUN 77* 03/01/2014   CREATININE 11.98* 03/01/2014   PROT 5.6* 02/28/2014   ALBUMIN 3.0* 02/28/2014   BILITOT 0.8 02/28/2014   ALKPHOS 40 02/28/2014   AST 13 02/28/2014   ALT 10 02/28/2014  .   Total Time in preparing paper work, data evaluation and todays exam - 35 minutes  Christon Parada M.D on 03/01/2014 at 4:49 PM  [facility] Office  [phone]

## 2014-03-01 NOTE — Progress Notes (Signed)
Pt d/c home.  Alert and oriented x4.  No c/o pain.  Education given on diet, activity, meds and follow-up care and instructions.  Pt verbalized understanding and stated that the Interventional Radiology PA already gave her instructions regarding her outpatient procedure in the AM.  IV D/Cd.  Tele D/Cd

## 2014-03-02 ENCOUNTER — Other Ambulatory Visit (HOSPITAL_COMMUNITY): Payer: Self-pay | Admitting: Interventional Radiology

## 2014-03-02 ENCOUNTER — Ambulatory Visit (HOSPITAL_COMMUNITY)
Admission: RE | Admit: 2014-03-02 | Discharge: 2014-03-02 | Disposition: A | Discharge status: Home / Self Care | Payer: BLUE CROSS/BLUE SHIELD | Source: Ambulatory Visit | Attending: Interventional Radiology | Admitting: Interventional Radiology

## 2014-03-02 DIAGNOSIS — Y832 Surgical operation with anastomosis, bypass or graft as the cause of abnormal reaction of the patient, or of later complication, without mention of misadventure at the time of the procedure: Secondary | ICD-10-CM | POA: Insufficient documentation

## 2014-03-02 DIAGNOSIS — T82868A Thrombosis of vascular prosthetic devices, implants and grafts, initial encounter: Secondary | ICD-10-CM | POA: Diagnosis not present

## 2014-03-02 DIAGNOSIS — N186 End stage renal disease: Secondary | ICD-10-CM

## 2014-03-02 DIAGNOSIS — Z992 Dependence on renal dialysis: Principal | ICD-10-CM

## 2014-03-02 DIAGNOSIS — N189 Chronic kidney disease, unspecified: Secondary | ICD-10-CM | POA: Diagnosis not present

## 2014-03-02 DIAGNOSIS — I871 Compression of vein: Secondary | ICD-10-CM | POA: Insufficient documentation

## 2014-03-02 DIAGNOSIS — Z5309 Procedure and treatment not carried out because of other contraindication: Secondary | ICD-10-CM | POA: Diagnosis not present

## 2014-03-02 MED ORDER — MIDAZOLAM HCL 2 MG/2ML IJ SOLN
INTRAMUSCULAR | Status: AC | PRN
  Administered 2014-03-02: 1 mg via INTRAVENOUS

## 2014-03-02 MED ORDER — HEPARIN SOD (PORK) LOCK FLUSH 100 UNIT/ML IV SOLN
INTRAVENOUS | Status: AC
  Filled 2014-03-02: qty 5

## 2014-03-02 MED ORDER — HEPARIN SODIUM (PORCINE) 1000 UNIT/ML IJ SOLN
INTRAMUSCULAR | Status: AC
  Filled 2014-03-02: qty 1

## 2014-03-02 MED ORDER — HEPARIN SODIUM (PORCINE) 1000 UNIT/ML IJ SOLN
INTRAMUSCULAR | Status: AC | PRN
  Administered 2014-03-02: 5000 [IU] via INTRAVENOUS

## 2014-03-02 MED ORDER — IOHEXOL 300 MG/ML  SOLN
50.0000 mL | Freq: Once | INTRAMUSCULAR | Status: AC | PRN
  Administered 2014-03-02: 50 mL via INTRAVENOUS

## 2014-03-02 MED ORDER — SODIUM CHLORIDE 0.9 % IV SOLN
INTRAVENOUS | Status: AC | PRN
  Administered 2014-03-02: 10 mL/h via INTRAVENOUS

## 2014-03-02 MED ORDER — MIDAZOLAM HCL 2 MG/2ML IJ SOLN
INTRAMUSCULAR | Status: AC
  Filled 2014-03-02: qty 2

## 2014-03-02 MED ORDER — FENTANYL CITRATE 0.05 MG/ML IJ SOLN
INTRAMUSCULAR | Status: AC | PRN
  Administered 2014-03-02: 50 ug via INTRAVENOUS

## 2014-03-02 MED ORDER — LIDOCAINE HCL 1 % IJ SOLN
INTRAMUSCULAR | Status: AC
  Filled 2014-03-02: qty 20

## 2014-03-02 MED ORDER — FENTANYL CITRATE 0.05 MG/ML IJ SOLN
INTRAMUSCULAR | Status: AC
  Filled 2014-03-02: qty 2

## 2014-03-02 MED ORDER — ALTEPLASE 100 MG IV SOLR
2.0000 mg | Freq: Once | INTRAVENOUS | Status: AC
  Administered 2014-03-02: 2 mg
  Filled 2014-03-02: qty 2

## 2014-03-02 NOTE — Sedation Documentation (Signed)
Pt to nurses station for recovery.  Food and beverage of choice provided. VSS, d/c PIV.  No c/o pain or discomfort at this time.  Pt discharged home with family.

## 2014-03-02 NOTE — Sedation Documentation (Signed)
Patient denies pain and is resting comfortably.  

## 2014-03-02 NOTE — Procedures (Signed)
Interventional Radiology Procedure Note  Procedure:  1.) Unsuccessful attempted declot of AVF.  Draining vein incompletely matured.  Complications: None Recommendations: - DC home - Return to vascular surgery to discuss options for new access  Signed,  Criselda Peaches, MD

## 2014-03-08 ENCOUNTER — Ambulatory Visit: Payer: Self-pay | Admitting: Obstetrics & Gynecology

## 2014-03-11 ENCOUNTER — Other Ambulatory Visit: Payer: Self-pay

## 2014-03-11 DIAGNOSIS — Z01810 Encounter for preprocedural cardiovascular examination: Secondary | ICD-10-CM

## 2014-03-11 DIAGNOSIS — N185 Chronic kidney disease, stage 5: Secondary | ICD-10-CM

## 2014-03-12 ENCOUNTER — Encounter: Payer: Self-pay | Admitting: Vascular Surgery

## 2014-03-13 ENCOUNTER — Ambulatory Visit (HOSPITAL_COMMUNITY)
Admission: RE | Admit: 2014-03-13 | Discharge: 2014-03-13 | Disposition: A | Discharge status: Home / Self Care | Payer: BLUE CROSS/BLUE SHIELD | Source: Ambulatory Visit | Attending: Vascular Surgery | Admitting: Vascular Surgery

## 2014-03-13 ENCOUNTER — Other Ambulatory Visit: Payer: Self-pay

## 2014-03-13 ENCOUNTER — Ambulatory Visit (INDEPENDENT_AMBULATORY_CARE_PROVIDER_SITE_OTHER): Payer: BLUE CROSS/BLUE SHIELD | Admitting: Vascular Surgery

## 2014-03-13 ENCOUNTER — Encounter: Payer: Self-pay | Admitting: Vascular Surgery

## 2014-03-13 VITALS — BP 127/77 | HR 78 | Resp 16 | Ht 69.5 in | Wt 328.0 lb

## 2014-03-13 DIAGNOSIS — N185 Chronic kidney disease, stage 5: Secondary | ICD-10-CM

## 2014-03-13 DIAGNOSIS — Z01810 Encounter for preprocedural cardiovascular examination: Secondary | ICD-10-CM

## 2014-03-13 NOTE — Progress Notes (Signed)
Established Dialysis Access  History of Present Illness  [name] is a 39 y.o. (17-Aug-1975) female who presents for re-evaluation for permanent access.  The patient is right hand dominant.  Previous access procedures have been completed in both arms.  Most recently her R 2nd BVT thrombosed.  They attempted a perc. TE on 03/02/14 but this failed.  This was in the setting of severe menstrual bleeding requiring multiple units of pRBC transfusion.  The patient has never had a previous PPM placed.  She has had bilateral BC AVF placement unsuccessfully.  The patient continues to not be ESRD though she is close.  Past Medical History  Diagnosis Date  . Hypertension   . Thyroid disease   . Morbid obesity   . Hyperlipidemia   . History of pneumonia   . Foot fracture   . Dilated cardiomyopathy   . CHF (congestive heart failure)     hosp. 2011  . Gout   . Anxiety     pt not aware of this  . Arthritis   . Anemia   . Menorrhagia   . Chronic kidney disease     not on dialysis at this time  . Encounter for blood transfusion   . History of blood transfusion   . Shortness of breath     seldom  . Pneumonia     hosp. 2010    Past Surgical History  Procedure Laterality Date  . Cardiac surgery  1979    repair of hole in heart  . Av fistula placement  08/04/2011    Procedure: ARTERIOVENOUS (AV) FISTULA CREATION;  Surgeon: Elam Dutch, MD;  Location: [facility] OR;  Service: Vascular;  Laterality: Right;  Creation right brachiocephalic arteriovenous fistula  . Av fistula placement Left 02/05/2013    Procedure: ARTERIOVENOUS FISTULA CREATION LEFT ARM;  Surgeon: Conrad Hartley, MD;  Location: [place];  Service: Vascular;  Laterality: Left;  . Thrombectomy w/ embolectomy Left 03/05/2013    Procedure: THROMBECTOMY OF LEFT BRACHIAL ARTERY;  Surgeon: Conrad Millerton, MD;  Location: [place];  Service: Vascular;  Laterality: Left;  . Ligation of arteriovenous  fistula Left 03/05/2013    Procedure: LIGATION OF  ARTERIOVENOUS  FISTULA;  Surgeon: Conrad [place], MD;  Location: [place];  Service: Vascular;  Laterality: Left;  . Patch angioplasty Left 03/05/2013    Procedure: PATCH ANGIOPLASTY;  Surgeon: Conrad Tillatoba, MD;  Location: [place];  Service: Vascular;  Laterality: Left;  . Av fistula placement Right 06/04/2013    Procedure: ARTERIOVENOUS (AV) FISTULA CREATION;  Surgeon: Conrad Wallace, MD;  Location: [place];  Service: Vascular;  Laterality: Right;  . Bascilic vein transposition Right 08/13/2013    Procedure: Second Stage Brachial Vein Transposition;  Surgeon: Conrad Hardesty, MD;  Location: [place];  Service: Vascular;  Laterality: Right;    History   Social History  . Marital Status: Single    Spouse Name: N/A  . Number of Children: N/A  . Years of Education: N/A   Occupational History  . Not on file.   Social History Main Topics  . Smoking status: Never Smoker   . Smokeless tobacco: Never Used  . Alcohol Use: No  . Drug Use: No  . Sexual Activity: Yes    Birth Control/ Protection: None     Comment: no birth control    Other Topics Concern  . Not on file   Social History Narrative    Family History  Problem Relation Age of Onset  . Diabetes Mother   . Hyperlipidemia Mother   . Hypertension Mother   . Kidney disease Mother   . Anesthesia problems Neg Hx   . Hypotension Neg Hx   . Malignant hyperthermia Neg Hx   . Pseudochol deficiency Neg Hx     Current Outpatient Prescriptions on File Prior to Visit  Medication Sig Dispense Refill  . allopurinol (ZYLOPRIM) 100 MG tablet Take 100 mg by mouth 2 (two) times daily.     . carvedilol (COREG) 25 MG tablet Take 25 mg by mouth 2 (two) times daily with a meal.    . ferrous sulfate 325 (65 FE) MG EC tablet Take 1 tablet (325 mg total) by mouth 2 (two) times daily. 60 tablet 1  . furosemide (LASIX) 80 MG tablet Take 80 mg by mouth 2 (two) times daily.    . hydrALAZINE (APRESOLINE) 100 MG tablet Take 50 mg by mouth 3 (three) times daily.     .  megestrol (MEGACE) 20 MG tablet Take 1 tablet (20 mg total) by mouth 2 (two) times daily. 60 tablet 0  . pravastatin (PRAVACHOL) 40 MG tablet Take 40 mg by mouth daily.     Marland Kitchen RENVELA 800 MG tablet Take 1,600 mg by mouth 3 (three) times daily with meals.     . calcitRIOL (ROCALTROL) 0.25 MCG capsule Take 0.25 mcg by mouth 3 (three) times a week. Monday Wednesday and Friday    . Calcium Carb-Cholecalciferol (CALCIUM 500 +D PO) Take 3 tablets by mouth daily.    Marland Kitchen HYDROcodone-acetaminophen (NORCO) 5-325 MG per tablet Take 2 tablets by mouth every 4 (four) hours as needed. (Patient not taking: Reported on 02/26/2014) 20 tablet 0   Current Facility-Administered Medications on File Prior to Visit  Medication Dose Route Frequency Provider Last Rate Last Dose  . 0.9 %  sodium chloride infusion   Intravenous Continuous Conrad Fithian, MD        No Known Allergies  REVIEW OF SYSTEMS:  (Positives checked otherwise negative)  CARDIOVASCULAR:  []  chest pain, []  chest pressure, []  palpitations, []  shortness of breath when laying flat, []  shortness of breath with exertion,  []  pain in feet when walking, []  pain in feet when laying flat, []  history of blood clot in veins (DVT), []  history of phlebitis, []  swelling in legs, []  varicose veins  PULMONARY:  []  productive cough, []  asthma, []  wheezing  NEUROLOGIC:  []  weakness in arms or legs, []  numbness in arms or legs, []  difficulty speaking or slurred speech, []  temporary loss of vision in one eye, []  dizziness  HEMATOLOGIC:  []  bleeding problems, []  problems with blood clotting too easily  MUSCULOSKEL:  []  joint pain, []  joint swelling  GASTROINTEST:  []  vomiting blood, []  blood in stool     GENITOURINARY:  []  burning with urination, []  blood in urine  PSYCHIATRIC:  []  history of major depression  INTEGUMENTARY:  []  rashes, []  ulcers      Physical Examination  Filed Vitals:   03/13/14 1352  BP: 127/77  Pulse: 78  Resp: 16  Height: 5' 9.5"  (1.765 m)  Weight: 328 lb (148.78 kg)  SpO2: 100%   Body mass index is 47.76 kg/(m^2).   General: A&O x 3, WD, morbidly obese   Pulmonary: Sym exp, good air movt, CTAB, no rales, rhonchi, & wheezing   Cardiac: RRR, Nl S1, S2, no Murmurs, rubs or gallops   Vascular:  Vessel  Right  Left   Radial  Faintly Palpable  Faintly Palpable   Ulnar  Not Palpable  Not Palpable   Brachial  Palpable  Faintly Palpable    Gastrointestinal: soft, NTND, -G/R, - HSM, - masses, - CVAT B   Musculoskeletal: M/S 5/5 throughout , Extremities without ischemic changes , B arm incisions healed, no thrill or bruit   Neurologic: Pain and light touch intact in extremities , Motor exam as listed above    Medical Decision Making  [name] is a 39 y.o. female who presents with chronic kidney disease stage V  Based on vein mapping and examination, this patient's permanent access options include: likely AVG.  I would get a B arm and central venogram to determine her future access options.  This scheduled for 21 MAR 16.  I discussed with the patient the nature of angiographic procedures, especially the limited patencies of any endovascular intervention.  The patient is aware of that the risks of an angiographic procedure include but are not limited to: bleeding, infection, access site complications, renal failure, embolization, rupture of vessel, dissection, possible need for emergent surgical intervention, possible need for surgical procedures to treat the patient's pathology, anaphylactic reaction to contrast, and stroke and death.    The patient is aware of the risks and agrees to proceed.  Adele Barthel, MD [facility] Office: [phone] Pager: [phone]  03/13/2014, 2:15 PM

## 2014-03-21 ENCOUNTER — Telehealth: Payer: Self-pay

## 2014-03-21 ENCOUNTER — Encounter (HOSPITAL_COMMUNITY)
Admission: RE | Admit: 2014-03-21 | Discharge: 2014-03-21 | Disposition: A | Discharge status: Home / Self Care | Payer: BLUE CROSS/BLUE SHIELD | Source: Ambulatory Visit | Attending: Nephrology | Admitting: Nephrology

## 2014-03-21 ENCOUNTER — Other Ambulatory Visit: Payer: Self-pay | Admitting: General Practice

## 2014-03-21 DIAGNOSIS — D631 Anemia in chronic kidney disease: Secondary | ICD-10-CM | POA: Diagnosis not present

## 2014-03-21 DIAGNOSIS — N185 Chronic kidney disease, stage 5: Secondary | ICD-10-CM | POA: Insufficient documentation

## 2014-03-21 LAB — IRON AND TIBC
Iron: 64 ug/dL (ref 42–145)
SATURATION RATIOS: 25 % (ref 20–55)
TIBC: 252 ug/dL (ref 250–470)
UIBC: 188 ug/dL (ref 125–400)

## 2014-03-21 LAB — FERRITIN: FERRITIN: 226 ng/mL (ref 10–291)

## 2014-03-21 LAB — POCT HEMOGLOBIN-HEMACUE: Hemoglobin: 8.5 g/dL — ABNORMAL LOW (ref 12.0–15.0)

## 2014-03-21 MED ORDER — MEGESTROL ACETATE 20 MG PO TABS: 20.0000 mg | ORAL_TABLET | Freq: Two times a day (BID) | ORAL | Status: DC

## 2014-03-21 MED ORDER — DARBEPOETIN ALFA 100 MCG/0.5ML IJ SOSY
100.0000 ug | PREFILLED_SYRINGE | INTRAMUSCULAR | Status: DC
  Administered 2014-03-21: 100 ug via SUBCUTANEOUS

## 2014-03-21 NOTE — Telephone Encounter (Signed)
Patient called requesting refill of megace until appointment with Dr Hulan Fray on 04/03/14. Refill approved by Dr. Ihor Dow. Medication e-prescribed. Patient informed.

## 2014-03-22 MED ORDER — DARBEPOETIN ALFA 100 MCG/0.5ML IJ SOSY
PREFILLED_SYRINGE | INTRAMUSCULAR | Status: AC
  Filled 2014-03-22: qty 0.5

## 2014-03-25 ENCOUNTER — Encounter (HOSPITAL_COMMUNITY)
Admission: RE | Disposition: A | Discharge status: Home / Self Care | Payer: Self-pay | Source: Ambulatory Visit | Attending: Vascular Surgery

## 2014-03-25 ENCOUNTER — Encounter (HOSPITAL_COMMUNITY): Payer: Self-pay | Admitting: Vascular Surgery

## 2014-03-25 ENCOUNTER — Ambulatory Visit (HOSPITAL_COMMUNITY)
Admission: RE | Admit: 2014-03-25 | Discharge: 2014-03-25 | Disposition: A | Discharge status: Home / Self Care | Payer: BLUE CROSS/BLUE SHIELD | Source: Ambulatory Visit | Attending: Vascular Surgery | Admitting: Vascular Surgery

## 2014-03-25 DIAGNOSIS — Z992 Dependence on renal dialysis: Secondary | ICD-10-CM | POA: Diagnosis not present

## 2014-03-25 DIAGNOSIS — I120 Hypertensive chronic kidney disease with stage 5 chronic kidney disease or end stage renal disease: Secondary | ICD-10-CM | POA: Diagnosis not present

## 2014-03-25 DIAGNOSIS — E785 Hyperlipidemia, unspecified: Secondary | ICD-10-CM | POA: Diagnosis not present

## 2014-03-25 DIAGNOSIS — N186 End stage renal disease: Secondary | ICD-10-CM | POA: Insufficient documentation

## 2014-03-25 DIAGNOSIS — I509 Heart failure, unspecified: Secondary | ICD-10-CM | POA: Diagnosis not present

## 2014-03-25 DIAGNOSIS — N185 Chronic kidney disease, stage 5: Secondary | ICD-10-CM | POA: Diagnosis present

## 2014-03-25 HISTORY — PX: VENOGRAM: SHX5497

## 2014-03-25 LAB — POCT I-STAT, CHEM 8
BUN: 72 mg/dL — ABNORMAL HIGH (ref 6–23)
Calcium, Ion: 1.16 mmol/L (ref 1.12–1.23)
Chloride: 111 mmol/L (ref 96–112)
Creatinine, Ser: 7.6 mg/dL — ABNORMAL HIGH (ref 0.50–1.10)
GLUCOSE: 90 mg/dL (ref 70–99)
HEMATOCRIT: 27 % — AB (ref 36.0–46.0)
Hemoglobin: 9.2 g/dL — ABNORMAL LOW (ref 12.0–15.0)
POTASSIUM: 4.8 mmol/L (ref 3.5–5.1)
SODIUM: 143 mmol/L (ref 135–145)
TCO2: 21 mmol/L (ref 0–100)

## 2014-03-25 LAB — PREGNANCY, URINE: Preg Test, Ur: NEGATIVE

## 2014-03-25 SURGERY — VENOGRAM
Anesthesia: LOCAL | Laterality: Bilateral

## 2014-03-25 MED ORDER — ACETAMINOPHEN 325 MG PO TABS: 650.0000 mg | ORAL_TABLET | ORAL | Status: DC | PRN

## 2014-03-25 MED ORDER — SODIUM CHLORIDE 0.9 % IV SOLN
INTRAVENOUS | Status: DC
  Administered 2014-03-25: 11:00:00 via INTRAVENOUS

## 2014-03-25 MED ORDER — SODIUM CHLORIDE 0.9 % IJ SOLN: 3.0000 mL | INTRAMUSCULAR | Status: DC | PRN

## 2014-03-25 MED ORDER — SODIUM CHLORIDE 0.9 % IV SOLN: 250.0000 mL | INTRAVENOUS | Status: DC | PRN

## 2014-03-25 MED ORDER — SODIUM CHLORIDE 0.9 % IV SOLN: INTRAVENOUS | Status: DC

## 2014-03-25 MED ORDER — SODIUM CHLORIDE 0.9 % IJ SOLN: 3.0000 mL | Freq: Two times a day (BID) | INTRAMUSCULAR | Status: DC

## 2014-03-25 NOTE — H&P (View-Only) (Signed)
Established Dialysis Access  History of Present Illness  [name] is a 39 y.o. (05/03/1975) female who presents for re-evaluation for permanent access.  The patient is right hand dominant.  Previous access procedures have been completed in both arms.  Most recently her R 2nd BVT thrombosed.  They attempted a perc. TE on 03/02/14 but this failed.  This was in the setting of severe menstrual bleeding requiring multiple units of pRBC transfusion.  The patient has never had a previous PPM placed.  She has had bilateral BC AVF placement unsuccessfully.  The patient continues to not be ESRD though she is close.  Past Medical History  Diagnosis Date  . Hypertension   . Thyroid disease   . Morbid obesity   . Hyperlipidemia   . History of pneumonia   . Foot fracture   . Dilated cardiomyopathy   . CHF (congestive heart failure)     hosp. 2011  . Gout   . Anxiety     pt not aware of this  . Arthritis   . Anemia   . Menorrhagia   . Chronic kidney disease     not on dialysis at this time  . Encounter for blood transfusion   . History of blood transfusion   . Shortness of breath     seldom  . Pneumonia     hosp. 2010    Past Surgical History  Procedure Laterality Date  . Cardiac surgery  1979    repair of hole in heart  . Av fistula placement  08/04/2011    Procedure: ARTERIOVENOUS (AV) FISTULA CREATION;  Surgeon: Elam Dutch, MD;  Location: [facility] OR;  Service: Vascular;  Laterality: Right;  Creation right brachiocephalic arteriovenous fistula  . Av fistula placement Left 02/05/2013    Procedure: ARTERIOVENOUS FISTULA CREATION LEFT ARM;  Surgeon: Conrad H[place], MD;  Location: [place];  Service: Vascular;  Laterality: Left;  . Thrombectomy w/ embolectomy Left 03/05/2013    Procedure: THROMBECTOMY OF LEFT BRACHIAL ARTERY;  Surgeon: Conrad Poydras, MD;  Location: [place];  Service: Vascular;  Laterality: Left;  . Ligation of arteriovenous  fistula Left 03/05/2013    Procedure: LIGATION OF  ARTERIOVENOUS  FISTULA;  Surgeon: Conrad Normandy, MD;  Location: [place];  Service: Vascular;  Laterality: Left;  . Patch angioplasty Left 03/05/2013    Procedure: PATCH ANGIOPLASTY;  Surgeon: Conrad Barview, MD;  Location: [place];  Service: Vascular;  Laterality: Left;  . Av fistula placement Right 06/04/2013    Procedure: ARTERIOVENOUS (AV) FISTULA CREATION;  Surgeon: Conrad Duncan, MD;  Location: [place];  Service: Vascular;  Laterality: Right;  . Bascilic vein transposition Right 08/13/2013    Procedure: Second Stage Brachial Vein Transposition;  Surgeon: Conrad Belvedere, MD;  Location: [place];  Service: Vascular;  Laterality: Right;    History   Social History  . Marital Status: Single    Spouse Name: N/A  . Number of Children: N/A  . Years of Education: N/A   Occupational History  . Not on file.   Social History Main Topics  . Smoking status: Never Smoker   . Smokeless tobacco: Never Used  . Alcohol Use: No  . Drug Use: No  . Sexual Activity: Yes    Birth Control/ Protection: None     Comment: no birth control    Other Topics Concern  . Not on file   Social History Narrative    Family History  Problem Relation Age of Onset  . Diabetes Mother   . Hyperlipidemia Mother   . Hypertension Mother   . Kidney disease Mother   . Anesthesia problems Neg Hx   . Hypotension Neg Hx   . Malignant hyperthermia Neg Hx   . Pseudochol deficiency Neg Hx     Current Outpatient Prescriptions on File Prior to Visit  Medication Sig Dispense Refill  . allopurinol (ZYLOPRIM) 100 MG tablet Take 100 mg by mouth 2 (two) times daily.     . carvedilol (COREG) 25 MG tablet Take 25 mg by mouth 2 (two) times daily with a meal.    . ferrous sulfate 325 (65 FE) MG EC tablet Take 1 tablet (325 mg total) by mouth 2 (two) times daily. 60 tablet 1  . furosemide (LASIX) 80 MG tablet Take 80 mg by mouth 2 (two) times daily.    . hydrALAZINE (APRESOLINE) 100 MG tablet Take 50 mg by mouth 3 (three) times daily.     .  megestrol (MEGACE) 20 MG tablet Take 1 tablet (20 mg total) by mouth 2 (two) times daily. 60 tablet 0  . pravastatin (PRAVACHOL) 40 MG tablet Take 40 mg by mouth daily.     Marland Kitchen RENVELA 800 MG tablet Take 1,600 mg by mouth 3 (three) times daily with meals.     . calcitRIOL (ROCALTROL) 0.25 MCG capsule Take 0.25 mcg by mouth 3 (three) times a week. Monday Wednesday and Friday    . Calcium Carb-Cholecalciferol (CALCIUM 500 +D PO) Take 3 tablets by mouth daily.    Marland Kitchen HYDROcodone-acetaminophen (NORCO) 5-325 MG per tablet Take 2 tablets by mouth every 4 (four) hours as needed. (Patient not taking: Reported on 02/26/2014) 20 tablet 0   Current Facility-Administered Medications on File Prior to Visit  Medication Dose Route Frequency Provider Last Rate Last Dose  . 0.9 %  sodium chloride infusion   Intravenous Continuous Conrad Kimberly, MD        No Known Allergies  REVIEW OF SYSTEMS:  (Positives checked otherwise negative)  CARDIOVASCULAR:  []  chest pain, []  chest pressure, []  palpitations, []  shortness of breath when laying flat, []  shortness of breath with exertion,  []  pain in feet when walking, []  pain in feet when laying flat, []  history of blood clot in veins (DVT), []  history of phlebitis, []  swelling in legs, []  varicose veins  PULMONARY:  []  productive cough, []  asthma, []  wheezing  NEUROLOGIC:  []  weakness in arms or legs, []  numbness in arms or legs, []  difficulty speaking or slurred speech, []  temporary loss of vision in one eye, []  dizziness  HEMATOLOGIC:  []  bleeding problems, []  problems with blood clotting too easily  MUSCULOSKEL:  []  joint pain, []  joint swelling  GASTROINTEST:  []  vomiting blood, []  blood in stool     GENITOURINARY:  []  burning with urination, []  blood in urine  PSYCHIATRIC:  []  history of major depression  INTEGUMENTARY:  []  rashes, []  ulcers      Physical Examination  Filed Vitals:   03/13/14 1352  BP: 127/77  Pulse: 78  Resp: 16  Height: 5' 9.5"  (1.765 m)  Weight: 328 lb (148.78 kg)  SpO2: 100%   Body mass index is 47.76 kg/(m^2).   General: A&O x 3, WD, morbidly obese   Pulmonary: Sym exp, good air movt, CTAB, no rales, rhonchi, & wheezing   Cardiac: RRR, Nl S1, S2, no Murmurs, rubs or gallops   Vascular:  Vessel  Right  Left   Radial  Faintly Palpable  Faintly Palpable   Ulnar  Not Palpable  Not Palpable   Brachial  Palpable  Faintly Palpable    Gastrointestinal: soft, NTND, -G/R, - HSM, - masses, - CVAT B   Musculoskeletal: M/S 5/5 throughout , Extremities without ischemic changes , B arm incisions healed, no thrill or bruit   Neurologic: Pain and light touch intact in extremities , Motor exam as listed above    Medical Decision Making  ARTIE CANEPA is a 39 y.o. female who presents with chronic kidney disease stage V  Based on vein mapping and examination, this patient's permanent access options include: likely AVG.  I would get a B arm and central venogram to determine her future access options.  This scheduled for 21 MAR 16.  I discussed with the patient the nature of angiographic procedures, especially the limited patencies of any endovascular intervention.  The patient is aware of that the risks of an angiographic procedure include but are not limited to: bleeding, infection, access site complications, renal failure, embolization, rupture of vessel, dissection, possible need for emergent surgical intervention, possible need for surgical procedures to treat the patient's pathology, anaphylactic reaction to contrast, and stroke and death.    The patient is aware of the risks and agrees to proceed.  Adele Barthel, MD [facility] [place] Office: [phone] Pager: [phone]  03/13/2014, 2:15 PM

## 2014-03-25 NOTE — Op Note (Signed)
    OPERATIVE NOTE   PROCEDURE: 1. bilateral arm venogram   PRE-OPERATIVE DIAGNOSIS: end stage renal disease  POST-OPERATIVE DIAGNOSIS: same as above   SURGEON: Adele Barthel, MD  ANESTHESIA: local  ESTIMATED BLOOD LOSS: 5 cc  FINDING(S): 1. Patent single right brachial vein with sclerotic mid-segment 2. Patent right high brachial veins (adequate for distal target) 3. Patent right central venous structures 4. Patent left central venous structures 5. Patent left high brachial veins (one dominant) 6. Stenotic left brachial vein in proximal 1/3, possible thrombus in distal segment  SPECIMEN(S):  None  CONTRAST: 45 cc  INDICATIONS: [name] is a 39 y.o. female who presents with end stage renal disease.  The patient is scheduled for bilateral arm and central venogram to help determine the availability of proximal veins for permanent access placement.  The patient is aware the risks include but are not limited to: bleeding, infection, thrombosis of the cannulated access, and possible anaphylactic reaction to the contrast.  The patient is aware of the risks of the procedure and elects to proceed forward.  DESCRIPTION: After full informed written consent was obtained, the patient was brought back to the angiography suite and placed supine upon the angiography table.  The patient was connected to monitoring equipment.  The right arm IV was connected to IV extension tubing.  Hand injections were completed to image the arm veins and central venous structures, the findings of which are listed above.  The left arm IV was connected to IV extension tubing.  Hand injections were completed to image the arm veins and central venous structures, the findings of which are listed above.    Based on the images, the right brachial vein appears to be a better option for a upper arm arteriovenous graft.  None of the veins visualized were compatible with arteriovenous fistula  placement.  COMPLICATIONS: none  CONDITION: stable   Adele Barthel, MD [facility] Office: [phone] Pager: [phone]  03/25/2014 12:55 PM

## 2014-03-25 NOTE — Interval H&P Note (Signed)
History and Physical Interval Note:  03/25/2014 10:11 AM  [name]  has presented today for surgery, with the diagnosis of stage 5 chronic kidney disease  The various methods of treatment have been discussed with the patient and family. After consideration of risks, benefits and other options for treatment, the patient has consented to  Procedure(s): VENOGRAM bilateral (Bilateral) as a surgical intervention .  The patient's history has been reviewed, patient examined, no change in status, stable for surgery.  I have reviewed the patient's chart and labs.  Questions were answered to the patient's satisfaction.     Finley Chevez LIANG-YU

## 2014-03-25 NOTE — Discharge Instructions (Signed)
Venogram, Care After °Refer to this sheet in the next few weeks. These instructions provide you with information on caring for yourself after your procedure. Your health care provider may also give you more specific instructions. Your treatment has been planned according to current medical practices, but problems sometimes occur. Call your health care provider if you have any problems or questions after your procedure. °WHAT TO EXPECT AFTER THE PROCEDURE °After your procedure, it is typical to have the following sensations: °· Mild discomfort at the catheter insertion site. °HOME CARE INSTRUCTIONS  °· Take all medicines exactly as directed. °· Follow any prescribed diet. °· Follow instructions regarding both rest and physical activity. °· Drink more fluids for the first several days after the procedure in order to help flush dye from your kidneys. °SEEK MEDICAL CARE IF: °· You develop a rash. °· You have fever not controlled by medicine. °SEEK IMMEDIATE MEDICAL CARE IF: °· There is pain, drainage, bleeding, redness, swelling, warmth or a red streak at the site of the IV tube. °· The extremity where your IV tube was placed becomes discolored, numb, or cool. °· You have difficulty breathing or shortness of breath. °· You develop chest pain. °· You have excessive dizziness or fainting. °Document Released: 10/11/2012 Document Revised: 12/26/2012 Document Reviewed: 10/11/2012 °[employer]® Patient Information ©2015 [employer]. This information is not intended to replace advice given to you by your health care provider. Make sure you discuss any questions you have with your health care provider. ° °

## 2014-04-03 ENCOUNTER — Ambulatory Visit: Payer: Self-pay | Admitting: Obstetrics & Gynecology

## 2014-04-05 ENCOUNTER — Other Ambulatory Visit: Payer: Self-pay

## 2014-04-11 ENCOUNTER — Ambulatory Visit (INDEPENDENT_AMBULATORY_CARE_PROVIDER_SITE_OTHER): Payer: BLUE CROSS/BLUE SHIELD | Admitting: Obstetrics & Gynecology

## 2014-04-11 ENCOUNTER — Encounter: Payer: Self-pay | Admitting: Obstetrics & Gynecology

## 2014-04-11 VITALS — BP 129/59 | HR 77 | Temp 98.0°F | Ht 69.0 in | Wt 332.0 lb

## 2014-04-11 DIAGNOSIS — Z0000 Encounter for general adult medical examination without abnormal findings: Secondary | ICD-10-CM | POA: Diagnosis not present

## 2014-04-11 MED ORDER — MISOPROSTOL 200 MCG PO TABS: ORAL_TABLET | ORAL | Status: DC

## 2014-04-11 NOTE — Progress Notes (Signed)
Subjective:    [name] is a 39 y.o.  M AA P0 female who presents for an annual exam. The patient has no complaints today except for a possible "boil" on her vagina for the last couple of weeks. The patient is not currently sexually active for the last 6 months due to husband's travel. GYN screening history: last pap: was normal. The patient wears seatbelts: yes. The patient participates in regular exercise: no. Has the patient ever been transfused or tattooed?: yes. (transfusions) The patient reports that there is not domestic violence in her life.   Menstrual History: OB History    No data available      Menarche age: 88  Patient's last menstrual period was 02/25/2014 (exact date).    The following portions of the patient's history were reviewed and updated as appropriate: allergies, current medications, past family history, past medical history, past social history, past surgical history and problem list.  Review of Systems A comprehensive review of systems was negative.    Objective:    BP 129/59 mmHg  Pulse 77  Temp(Src) 98 F (36.7 C)  Ht 5\' 9"  (1.753 m)  Wt 332 lb (150.594 kg)  BMI 49.01 kg/m2  LMP 02/25/2014 (Exact Date)  General Appearance:    Alert, cooperative, no distress, appears stated age  Head:    Normocephalic, without obvious abnormality, atraumatic  Eyes:    PERRL, conjunctiva/corneas clear, EOM's intact, fundi    benign, both eyes  Ears:    Normal TM's and external ear canals, both ears  Nose:   Nares normal, septum midline, mucosa normal, no drainage    or sinus tenderness  Throat:   Lips, mucosa, and tongue normal; teeth and gums normal  Neck:   Supple, symmetrical, trachea midline, no adenopathy;    thyroid:  no enlargement/tenderness/nodules; no carotid   bruit or JVD  Back:     Symmetric, no curvature, ROM normal, no CVA tenderness  Lungs:     Clear to auscultation bilaterally, respirations unlabored  Chest Wall:    No tenderness or deformity    Heart:    Regular rate and rhythm, S1 and S2 normal, no murmur, rub   or gallop  Breast Exam:    No tenderness, masses, or nipple abnormality  Abdomen:     Soft, non-tender, bowel sounds active all four quadrants,    no masses, no organomegaly  Genitalia:    Normal female without lesion, discharge or tenderness, I could not palpate any pelvic masses     Extremities:   Extremities normal, atraumatic, no cyanosis or edema  Pulses:   2+ and symmetric all extremities  Skin:   Skin color, texture, turgor normal, no rashes or lesions  Lymph nodes:   Cervical, supraclavicular, and axillary nodes normal  Neurologic:   CNII-XII intact, normal strength, sensation and reflexes    throughout  .    Assessment:    Healthy female exam.   menorrhagia   Plan:     Breast self exam technique reviewed and patient encouraged to perform self-exam monthly.   Place Mirena next month for menorrhagia Cytotec the night before IUD

## 2014-04-12 ENCOUNTER — Encounter (HOSPITAL_COMMUNITY): Payer: Self-pay | Admitting: *Deleted

## 2014-04-12 NOTE — Progress Notes (Signed)
Pt denies SOB, chest pain, and being under the care of a cardiologist.

## 2014-04-12 NOTE — Progress Notes (Signed)
   04/12/14 1619  OBSTRUCTIVE SLEEP APNEA  Have you ever been diagnosed with sleep apnea through a sleep study? No  Do you snore loudly (loud enough to be heard through closed doors)?  1  Do you often feel tired, fatigued, or sleepy during the daytime? 0  Has anyone observed you stop breathing during your sleep? 0  Do you have, or are you being treated for high blood pressure? 1  BMI more than 35 kg/m2? 1  Age over 40 years old? 0  Gender: 0

## 2014-04-14 MED ORDER — DEXTROSE 5 % IV SOLN
1.5000 g | INTRAVENOUS | Status: AC
  Administered 2014-04-15: 1.5 g via INTRAVENOUS
  Filled 2014-04-14: qty 1.5

## 2014-04-15 ENCOUNTER — Ambulatory Visit (HOSPITAL_COMMUNITY): Payer: BLUE CROSS/BLUE SHIELD | Admitting: Certified Registered"

## 2014-04-15 ENCOUNTER — Encounter (HOSPITAL_COMMUNITY)
Admission: RE | Disposition: A | Discharge status: Home / Self Care | Payer: Self-pay | Source: Ambulatory Visit | Attending: Vascular Surgery

## 2014-04-15 ENCOUNTER — Ambulatory Visit (HOSPITAL_COMMUNITY)
Admission: RE | Admit: 2014-04-15 | Discharge: 2014-04-15 | Disposition: A | Discharge status: Home / Self Care | Payer: BLUE CROSS/BLUE SHIELD | Source: Ambulatory Visit | Attending: Vascular Surgery | Admitting: Vascular Surgery

## 2014-04-15 DIAGNOSIS — I739 Peripheral vascular disease, unspecified: Secondary | ICD-10-CM | POA: Insufficient documentation

## 2014-04-15 DIAGNOSIS — F419 Anxiety disorder, unspecified: Secondary | ICD-10-CM | POA: Insufficient documentation

## 2014-04-15 DIAGNOSIS — I420 Dilated cardiomyopathy: Secondary | ICD-10-CM | POA: Diagnosis not present

## 2014-04-15 DIAGNOSIS — D649 Anemia, unspecified: Secondary | ICD-10-CM | POA: Insufficient documentation

## 2014-04-15 DIAGNOSIS — M109 Gout, unspecified: Secondary | ICD-10-CM | POA: Insufficient documentation

## 2014-04-15 DIAGNOSIS — E079 Disorder of thyroid, unspecified: Secondary | ICD-10-CM | POA: Insufficient documentation

## 2014-04-15 DIAGNOSIS — Z6841 Body Mass Index (BMI) 40.0 and over, adult: Secondary | ICD-10-CM | POA: Diagnosis not present

## 2014-04-15 DIAGNOSIS — I509 Heart failure, unspecified: Secondary | ICD-10-CM | POA: Insufficient documentation

## 2014-04-15 DIAGNOSIS — N186 End stage renal disease: Secondary | ICD-10-CM | POA: Insufficient documentation

## 2014-04-15 DIAGNOSIS — I120 Hypertensive chronic kidney disease with stage 5 chronic kidney disease or end stage renal disease: Secondary | ICD-10-CM | POA: Insufficient documentation

## 2014-04-15 DIAGNOSIS — N185 Chronic kidney disease, stage 5: Secondary | ICD-10-CM | POA: Diagnosis not present

## 2014-04-15 DIAGNOSIS — E785 Hyperlipidemia, unspecified: Secondary | ICD-10-CM | POA: Diagnosis not present

## 2014-04-15 HISTORY — DX: Cardiac murmur, unspecified: R01.1

## 2014-04-15 HISTORY — PX: AV FISTULA PLACEMENT: SHX1204

## 2014-04-15 LAB — POCT I-STAT 4, (NA,K, GLUC, HGB,HCT)
Glucose, Bld: 94 mg/dL (ref 70–99)
HCT: 28 % — ABNORMAL LOW (ref 36.0–46.0)
HEMOGLOBIN: 9.5 g/dL — AB (ref 12.0–15.0)
Potassium: 3.8 mmol/L (ref 3.5–5.1)
Sodium: 142 mmol/L (ref 135–145)

## 2014-04-15 LAB — HCG, SERUM, QUALITATIVE: Preg, Serum: NEGATIVE

## 2014-04-15 SURGERY — INSERTION OF ARTERIOVENOUS (AV) GORE-TEX GRAFT ARM
Anesthesia: General | Site: Arm Upper | Laterality: Right

## 2014-04-15 MED ORDER — CHLORHEXIDINE GLUCONATE CLOTH 2 % EX PADS: 6.0000 | MEDICATED_PAD | Freq: Once | CUTANEOUS | Status: DC

## 2014-04-15 MED ORDER — LIDOCAINE-EPINEPHRINE (PF) 1 %-1:200000 IJ SOLN
INTRAMUSCULAR | Status: DC | PRN
Start: 2014-04-15 — End: 2014-04-15
  Administered 2014-04-15: 5 mL

## 2014-04-15 MED ORDER — THROMBIN 20000 UNITS EX SOLR
CUTANEOUS | Status: AC
  Filled 2014-04-15: qty 20000

## 2014-04-15 MED ORDER — ARTIFICIAL TEARS OP OINT
TOPICAL_OINTMENT | OPHTHALMIC | Status: DC | PRN
  Administered 2014-04-15: 1 via OPHTHALMIC

## 2014-04-15 MED ORDER — ONDANSETRON HCL 4 MG/2ML IJ SOLN
INTRAMUSCULAR | Status: DC | PRN
  Administered 2014-04-15: 4 mg via INTRAVENOUS

## 2014-04-15 MED ORDER — LIDOCAINE HCL (CARDIAC) 20 MG/ML IV SOLN
INTRAVENOUS | Status: AC
  Filled 2014-04-15: qty 5

## 2014-04-15 MED ORDER — PROPOFOL 10 MG/ML IV BOLUS
INTRAVENOUS | Status: DC | PRN
  Administered 2014-04-15: 160 mg via INTRAVENOUS

## 2014-04-15 MED ORDER — PHENYLEPHRINE HCL 10 MG/ML IJ SOLN
INTRAMUSCULAR | Status: DC | PRN
  Administered 2014-04-15 (×2): 80 ug via INTRAVENOUS
  Administered 2014-04-15: 160 ug via INTRAVENOUS

## 2014-04-15 MED ORDER — HEPARIN SODIUM (PORCINE) 1000 UNIT/ML IJ SOLN
INTRAMUSCULAR | Status: DC | PRN
  Administered 2014-04-15: 10000 [IU] via INTRAVENOUS

## 2014-04-15 MED ORDER — FENTANYL CITRATE 0.05 MG/ML IJ SOLN
INTRAMUSCULAR | Status: AC
  Filled 2014-04-15: qty 5

## 2014-04-15 MED ORDER — LIDOCAINE HCL (CARDIAC) 20 MG/ML IV SOLN
INTRAVENOUS | Status: DC | PRN
  Administered 2014-04-15: 90 mg via INTRAVENOUS

## 2014-04-15 MED ORDER — HEPARIN SODIUM (PORCINE) 5000 UNIT/ML IJ SOLN
INTRAMUSCULAR | Status: DC | PRN
  Administered 2014-04-15: 500 mL

## 2014-04-15 MED ORDER — FENTANYL CITRATE 0.05 MG/ML IJ SOLN
INTRAMUSCULAR | Status: DC | PRN
  Administered 2014-04-15: 100 ug via INTRAVENOUS
  Administered 2014-04-15 (×6): 25 ug via INTRAVENOUS

## 2014-04-15 MED ORDER — ROCURONIUM BROMIDE 50 MG/5ML IV SOLN
INTRAVENOUS | Status: AC
  Filled 2014-04-15: qty 1

## 2014-04-15 MED ORDER — 0.9 % SODIUM CHLORIDE (POUR BTL) OPTIME
TOPICAL | Status: DC | PRN
  Administered 2014-04-15: 1000 mL

## 2014-04-15 MED ORDER — SODIUM CHLORIDE 0.9 % IJ SOLN
INTRAMUSCULAR | Status: AC
  Filled 2014-04-15: qty 10

## 2014-04-15 MED ORDER — SODIUM CHLORIDE 0.9 % IV SOLN: INTRAVENOUS | Status: DC

## 2014-04-15 MED ORDER — MIDAZOLAM HCL 2 MG/2ML IJ SOLN
INTRAMUSCULAR | Status: AC
  Filled 2014-04-15: qty 2

## 2014-04-15 MED ORDER — HYDROCODONE-ACETAMINOPHEN 5-325 MG PO TABS: 1.0000 | ORAL_TABLET | Freq: Four times a day (QID) | ORAL | Status: DC | PRN

## 2014-04-15 MED ORDER — BUPIVACAINE HCL (PF) 0.5 % IJ SOLN
INTRAMUSCULAR | Status: DC | PRN
  Administered 2014-04-15: 5 mL

## 2014-04-15 MED ORDER — PROPOFOL 10 MG/ML IV BOLUS
INTRAVENOUS | Status: AC
  Filled 2014-04-15: qty 20

## 2014-04-15 MED ORDER — PROTAMINE SULFATE 10 MG/ML IV SOLN
INTRAVENOUS | Status: DC | PRN
  Administered 2014-04-15: 50 mg via INTRAVENOUS

## 2014-04-15 MED ORDER — ONDANSETRON HCL 4 MG/2ML IJ SOLN
INTRAMUSCULAR | Status: AC
  Filled 2014-04-15: qty 2

## 2014-04-15 MED ORDER — PHENYLEPHRINE HCL 10 MG/ML IJ SOLN
10.0000 mg | INTRAVENOUS | Status: DC | PRN
  Administered 2014-04-15: 20 ug/min via INTRAVENOUS

## 2014-04-15 MED ORDER — EPHEDRINE SULFATE 50 MG/ML IJ SOLN
INTRAMUSCULAR | Status: AC
  Filled 2014-04-15: qty 1

## 2014-04-15 MED ORDER — ARTIFICIAL TEARS OP OINT
TOPICAL_OINTMENT | OPHTHALMIC | Status: AC
Start: 2014-04-15 — End: 2014-04-15
  Filled 2014-04-15: qty 3.5

## 2014-04-15 MED ORDER — THROMBIN 20000 UNITS EX SOLR
CUTANEOUS | Status: DC | PRN
  Administered 2014-04-15: 20 mL via TOPICAL

## 2014-04-15 MED ORDER — LIDOCAINE-EPINEPHRINE (PF) 1 %-1:200000 IJ SOLN
INTRAMUSCULAR | Status: AC
  Filled 2014-04-15: qty 10

## 2014-04-15 MED ORDER — DEXAMETHASONE SODIUM PHOSPHATE 10 MG/ML IJ SOLN
INTRAMUSCULAR | Status: DC | PRN
  Administered 2014-04-15: 10 mg via INTRAVENOUS

## 2014-04-15 SURGICAL SUPPLY — 39 items
ARMBAND PINK RESTRICT EXTREMIT (MISCELLANEOUS) ×2 IMPLANT
CANISTER SUCTION 2500CC (MISCELLANEOUS) ×2 IMPLANT
CLIP TI MEDIUM 6 (CLIP) ×2 IMPLANT
CLIP TI WIDE RED SMALL 6 (CLIP) ×2 IMPLANT
COVER PROBE W GEL 5X96 (DRAPES) ×2 IMPLANT
DECANTER SPIKE VIAL GLASS SM (MISCELLANEOUS) ×2 IMPLANT
ELECT REM PT RETURN 9FT ADLT (ELECTROSURGICAL) ×2
ELECTRODE REM PT RTRN 9FT ADLT (ELECTROSURGICAL) ×1 IMPLANT
GLOVE BIO SURGEON STRL SZ7 (GLOVE) ×2 IMPLANT
GLOVE BIO SURGEON STRL SZ8 (GLOVE) ×1 IMPLANT
GLOVE BIOGEL PI IND STRL 6.5 (GLOVE) IMPLANT
GLOVE BIOGEL PI IND STRL 7.0 (GLOVE) IMPLANT
GLOVE BIOGEL PI IND STRL 7.5 (GLOVE) ×1 IMPLANT
GLOVE BIOGEL PI INDICATOR 6.5 (GLOVE) ×1
GLOVE BIOGEL PI INDICATOR 7.0 (GLOVE) ×1
GLOVE BIOGEL PI INDICATOR 7.5 (GLOVE) ×1
GLOVE ECLIPSE 6.5 STRL STRAW (GLOVE) ×1 IMPLANT
GOWN STRL REUS W/ TWL LRG LVL3 (GOWN DISPOSABLE) ×3 IMPLANT
GOWN STRL REUS W/TWL LRG LVL3 (GOWN DISPOSABLE) ×6
GRAFT GORETEX STRT 4-7X45 (Vascular Products) ×1 IMPLANT
KIT BASIN OR (CUSTOM PROCEDURE TRAY) ×2 IMPLANT
KIT ROOM TURNOVER OR (KITS) ×2 IMPLANT
LIQUID BAND (GAUZE/BANDAGES/DRESSINGS) ×3 IMPLANT
NS IRRIG 1000ML POUR BTL (IV SOLUTION) ×2 IMPLANT
PACK CV ACCESS (CUSTOM PROCEDURE TRAY) ×2 IMPLANT
PAD ARMBOARD 7.5X6 YLW CONV (MISCELLANEOUS) ×4 IMPLANT
SPONGE SURGIFOAM ABS GEL 100 (HEMOSTASIS) IMPLANT
SUT MNCRL AB 4-0 PS2 18 (SUTURE) ×2 IMPLANT
SUT PROLENE 5 0 C 1 24 (SUTURE) IMPLANT
SUT PROLENE 6 0 BV (SUTURE) ×4 IMPLANT
SUT PROLENE 7 0 BV 1 (SUTURE) ×1 IMPLANT
SUT SILK 2 0 FS (SUTURE) ×2 IMPLANT
SUT VIC AB 2-0 CT1 27 (SUTURE) ×4
SUT VIC AB 2-0 CT1 TAPERPNT 27 (SUTURE) IMPLANT
SUT VIC AB 3-0 SH 27 (SUTURE) ×4
SUT VIC AB 3-0 SH 27X BRD (SUTURE) ×2 IMPLANT
SUT VICRYL AB 4 0 18 (SUTURE) ×1 IMPLANT
UNDERPAD 30X30 INCONTINENT (UNDERPADS AND DIAPERS) ×2 IMPLANT
WATER STERILE IRR 1000ML POUR (IV SOLUTION) ×2 IMPLANT

## 2014-04-15 NOTE — Anesthesia Preprocedure Evaluation (Addendum)
Anesthesia Evaluation  Patient identified by MRN, date of birth, ID band Patient awake    Reviewed: Allergy & Precautions, H&P , NPO status , Patient's Chart, lab work & pertinent test results, reviewed documented beta blocker date and time   History of Anesthesia Complications Negative for: history of anesthetic complications  Airway Mallampati: II  TM Distance: >3 FB Neck ROM: Full    Dental  (+) Poor Dentition, Dental Advisory Given   Pulmonary shortness of breath and with exertion, pneumonia -,  breath sounds clear to auscultation        Cardiovascular hypertension, Pt. on medications and Pt. on home beta blockers - angina+ Peripheral Vascular Disease Rhythm:Regular Rate:Normal  4/15 ECHO: EF Q000111Q, grade 2 diastolic dysfunction, valves OK   Neuro/Psych Anxiety negative neurological ROS     GI/Hepatic negative GI ROS, Neg liver ROS,   Endo/Other  Morbid obesity  Renal/GU CRFRenal disease (k+ 3.8, not on dialysis yet)     Musculoskeletal   Abdominal (+) + obese,   Peds  Hematology  (+) Blood dyscrasia (Hb 10.9), ,   Anesthesia Other Findings   Reproductive/Obstetrics 08/13/13 preg test NEG                            Anesthesia Physical  Anesthesia Plan  ASA: III  Anesthesia Plan: General   Post-op Pain Management:    Induction: Intravenous  Airway Management Planned: LMA  Additional Equipment:   Intra-op Plan:   Post-operative Plan: Extubation in OR  Informed Consent: I have reviewed the patients History and Physical, chart, labs and discussed the procedure including the risks, benefits and alternatives for the proposed anesthesia with the patient or authorized representative who has indicated his/her understanding and acceptance.   Dental advisory given  Plan Discussed with: Surgeon, CRNA and Anesthesiologist  Anesthesia Plan Comments:        Anesthesia Quick  Evaluation                                  Anesthesia Evaluation  Patient identified by MRN, date of birth, ID band Patient awake    Reviewed: Allergy & Precautions, H&P , NPO status , Patient's Chart, lab work & pertinent test results, reviewed documented beta blocker date and time   History of Anesthesia Complications Negative for: history of anesthetic complications  Airway Mallampati: II TM Distance: >3 FB Neck ROM: Full    Dental  (+) Poor Dentition, Dental Advisory Given   Pulmonary shortness of breath and with exertion,  breath sounds clear to auscultation        Cardiovascular hypertension, Pt. on medications and Pt. on home beta blockers - anginaRhythm:Regular Rate:Normal  4/15 ECHO: EF Q000111Q, grade 2 diastolic dysfunction, valves OK   Neuro/Psych negative neurological ROS     GI/Hepatic negative GI ROS, Neg liver ROS,   Endo/Other  Morbid obesity  Renal/GU CRFRenal disease (k+ 3.8, not on dialysis yet)     Musculoskeletal   Abdominal (+) + obese,   Peds  Hematology  (+) Blood dyscrasia (Hb 10.9), ,   Anesthesia Other Findings   Reproductive/Obstetrics 08/13/13 preg test NEG                        Anesthesia Physical Anesthesia Plan  ASA: III  Anesthesia Plan: General   Post-op Pain Management:    Induction:  Intravenous  Airway Management Planned: LMA  Additional Equipment:   Intra-op Plan:   Post-operative Plan:   Informed Consent: I have reviewed the patients History and Physical, chart, labs and discussed the procedure including the risks, benefits and alternatives for the proposed anesthesia with the patient or authorized representative who has indicated his/her understanding and acceptance.   Dental advisory given  Plan Discussed with: Surgeon and CRNA  Anesthesia Plan Comments: (Plan routine monitors, GA- LMA OK)        Anesthesia Quick Evaluation                                    Anesthesia Evaluation  Patient identified by MRN, date of birth, ID band Patient awake    Reviewed: Allergy & Precautions, H&P , NPO status , Patient's Chart, lab work & pertinent test results, reviewed documented beta blocker date and time   History of Anesthesia Complications Negative for: history of anesthetic complications  Airway Mallampati: II TM Distance: >3 FB Neck ROM: Full    Dental  (+) Poor Dentition, Dental Advisory Given   Pulmonary shortness of breath and with exertion,  breath sounds clear to auscultation        Cardiovascular hypertension, Pt. on medications and Pt. on home beta blockers - anginaRhythm:Regular Rate:Normal  4/15 ECHO: EF Q000111Q, grade 2 diastolic dysfunction, valves OK   Neuro/Psych negative neurological ROS     GI/Hepatic negative GI ROS, Neg liver ROS,   Endo/Other  Morbid obesity  Renal/GU CRFRenal disease (k+ 3.8, not on dialysis yet)     Musculoskeletal   Abdominal (+) + obese,   Peds  Hematology  (+) Blood dyscrasia (Hb 10.9), ,   Anesthesia Other Findings   Reproductive/Obstetrics 08/13/13 preg test NEG                        Anesthesia Physical Anesthesia Plan  ASA: III  Anesthesia Plan: General   Post-op Pain Management:    Induction: Intravenous  Airway Management Planned: LMA  Additional Equipment:   Intra-op Plan:   Post-operative Plan:   Informed Consent: I have reviewed the patients History and Physical, chart, labs and discussed the procedure including the risks, benefits and alternatives for the proposed anesthesia with the patient or authorized representative who has indicated his/her understanding and acceptance.   Dental advisory given  Plan Discussed with: Surgeon and CRNA  Anesthesia Plan Comments: (Plan routine monitors, GA- LMA OK)        Anesthesia Quick Evaluation

## 2014-04-15 NOTE — Op Note (Signed)
OPERATIVE NOTE   PROCEDURE:  right upper arm arteriovenous graft  PRE-OPERATIVE DIAGNOSIS: end stage renal disease   POST-OPERATIVE DIAGNOSIS: same as above   SURGEON: Adele Barthel, MD  ASSISTANT(S): Silva Bandy, PAC   ANESTHESIA: general  ESTIMATED BLOOD LOSS: 50 cc  FINDING(S): 1. Palpable thrill and radial pulse at end case  SPECIMEN(S):  none  INDICATIONS:   [name] is a 39 y.o. female who presents with imminent end stage renal disease.  Based on venography, it appeared her next access option a right upper arteriovenous graft.  Risk, benefits, and alternatives to access surgery were discussed.  The patient is aware the risks include but are not limited to: bleeding, infection, steal syndrome, nerve damage, ischemic monomelic neuropathy, failure to mature, and need for additional procedures.  The patient is aware of the risks and elects to proceed forward.  DESCRIPTION: After full informed written consent was obtained from the patient, the patient was brought back to the operating room and placed supine upon the operating table.  The patient was given IV antibiotics prior to proceeding.  After obtaining adequate sedation, the patient was prepped and draped in standard fashion for a right arm access procedure.  I turned my attention first to the antecubitum.  Under ultrasound guidance, I identified the location of the brachial artery and marked it on the skin.  I then examined the bicipital groove and identified the high brachial vein and marked it on the skin.  I injected a mixture of 1% lidocaine with epinephrine at this location and at the high bicipital groove to obtain some anesthesia.  In total, I used 10 mL of this mixture to obtain anesthesia at the axillary incision and also this high bicipital incision and also for the tunnel route.  I made an incision over the brachial artery, and dissected down through the subcutaneous tissue to the fascia carefully and was able to  dissect out the brachial artery.  The artery was about 3-4 mm externally.  Due to the prior scar tissue, I could not clearly identify the posterior wall, as it was densely adherent to the remaining brachial vein.  I turned my attention to the high bicipital groove.  I made an incision at the previously anesthetized site, dissected down through the subcutaneous tissue and fascia until I reached the high brachial vein.  Externally, it appeared to be 6 mm in diameter.  I then dissected this vein proximally and distal.  I then I injected the mixture of local anesthetic along the marked route for a subcutaneous tunnel.  I took a Dietitian and dissected from the antecubital up to the high bicipital incision.  Then I delivered the 4 x 7-mm stretch Gore-Tex graft, through this metal tunneler and then pulled out the metal tunneler leaving the graft in place.  The 4-mm end was left on the antecubital side and the 7 mm toward the axillary.  I then gave the patient 10000 units of heparin to gain some anticoagulation.  After waiting 3 minutes, I clamped the brachial artery and vein proximally and distally and made an arteriotomy and extended it with a Potts scissor.  I sewed the 4-mm end of the graft to this arteriotomy with a running stitch of 6-0 Prolene.  At this point, then I completed the anastomosis in the usual fashion.  I released the vessel loops on the inflow and allowed the artery to decompress through the graft. There was good pulsatile bleeding through  this graft.  I clamped the graft near its arterial anastomosis and sucked out all the blood in the graft and loaded the graft with heparinized saline.  At this point, I pulled the graft to appropriate length and reset my exposure of the high brachial vein.  I tied off the high brachial vein distally with a 2-0 silk and then transected it.  There was good venous backbleeding from the vein.  Then, I injected some heparinized saline into this vein and then  clamped it.  I then spatulated the vein to facilitate an end-to- end anastomosis.  I also spatulated the graft to facilitate an end-to-end anastomosis.  In the process of spatulating, I cut the graft to appropriate length for this anastomosis.  This graft was sewn to the vein in an end-to-end configuration with a 6-0 Prolene.  Prior to completing this anastomosis, I allowed the vein to back bleed and then I also allowed the artery to bleed in an antegrade fashion.  I completed this anastomosis in the usual fashion, and then irrigated out the high bicipital exposure and then placed thrombin and Gelfoam.  I then turned my attention back to the antecubitum.  The distal radial signal was dopplerable, along with a palpable radial pulse.  Using a continuous Doppler, the brachial artery proximally and distally had multiphasic waveforms.  The venous outflow had a flow signature consistent with widely patent arterial venous graft.  At this point, I washed out the antecubital incision.  There was no more active bleeding.  The subcutaneous tissue was reapproximated with a running stitch of 3-0 Vicryl.  The skin was then reapproximated with a running subcuticular 4-0 Monocryl.  The skin was then cleaned, dried, and Dermabond used to reinforce the skin closure.  We then turned our attention to the high bicipital exposure.  I removed all the thrombin and gelfoam and washed out the wound.  There was no more active bleeding.  The subcutaneous tissue was repaired with running stitch of 3-0 Vicryl.  The skin was then reapproximated with running subcuticular 4-0 Monocryl.  The skin was then cleaned, dried, and then the skin closure was reinforced with Dermabond.    COMPLICATIONS: none  CONDITION: stable   Adele Barthel, MD [facility] Office: [phone] Pager: [phone]  04/15/2014, 10:01 AM

## 2014-04-15 NOTE — H&P (Signed)
Brief History and Physical  History of Present Illness  [name] is a 39 y.o. female who presents with chief complaint: ESRD.  The patient presents today for placement RUA AVG.  Prior fistula in that arm recently failed. Venogram suggests only AVG remains an option  Past Medical History  Diagnosis Date  . Hypertension   . Thyroid disease   . Morbid obesity   . Hyperlipidemia   . History of pneumonia   . Foot fracture   . Dilated cardiomyopathy   . CHF (congestive heart failure)     hosp. 2011  . Gout   . Anxiety     pt not aware of this  . Arthritis   . Anemia   . Menorrhagia   . Chronic kidney disease     not on dialysis at this time  . Encounter for blood transfusion   . History of blood transfusion   . Shortness of breath     seldom  . Pneumonia     hosp. 2010  . Heart murmur     as a child    Past Surgical History  Procedure Laterality Date  . Cardiac surgery  1979    repair of hole in heart  . Av fistula placement  08/04/2011    Procedure: ARTERIOVENOUS (AV) FISTULA CREATION;  Surgeon: Elam Dutch, MD;  Location: [facility] OR;  Service: Vascular;  Laterality: Right;  Creation right brachiocephalic arteriovenous fistula  . Av fistula placement Left 02/05/2013    Procedure: ARTERIOVENOUS FISTULA CREATION LEFT ARM;  Surgeon: Conrad Troutdale, MD;  Location: [place];  Service: Vascular;  Laterality: Left;  . Thrombectomy w/ embolectomy Left 03/05/2013    Procedure: THROMBECTOMY OF LEFT BRACHIAL ARTERY;  Surgeon: Conrad Humboldt, MD;  Location: [place];  Service: Vascular;  Laterality: Left;  . Ligation of arteriovenous  fistula Left 03/05/2013    Procedure: LIGATION OF ARTERIOVENOUS  FISTULA;  Surgeon: Conrad Clackamas, MD;  Location: [place];  Service: Vascular;  Laterality: Left;  . Patch angioplasty Left 03/05/2013    Procedure: PATCH ANGIOPLASTY;  Surgeon: Conrad Trona, MD;  Location: [place];  Service: Vascular;  Laterality: Left;  . Av fistula placement Right 06/04/2013   Procedure: ARTERIOVENOUS (AV) FISTULA CREATION;  Surgeon: Conrad [employer], MD;  Location: [place];  Service: Vascular;  Laterality: Right;  . Bascilic vein transposition Right 08/13/2013    Procedure: Second Stage Brachial Vein Transposition;  Surgeon: Conrad Clayton, MD;  Location: [place];  Service: Vascular;  Laterality: Right;  . Venogram Bilateral 03/25/2014    Procedure: VENOGRAM bilateral;  Surgeon: Conrad Peebles, MD;  Location: [facility] CATH LAB;  Service: Cardiovascular;  Laterality: Bilateral;    History   Social History  . Marital Status: Married    Spouse Name: N/A  . Number of Children: N/A  . Years of Education: N/A   Occupational History  . Not on file.   Social History Main Topics  . Smoking status: Never Smoker   . Smokeless tobacco: Never Used  . Alcohol Use: No  . Drug Use: No  . Sexual Activity: Yes    Birth Control/ Protection: None     Comment: no birth control    Other Topics Concern  . Not on file   Social History Narrative    Family History  Problem Relation Age of Onset  . Diabetes Mother   . Hyperlipidemia Mother   . Hypertension Mother   .  Kidney disease Mother   . Anesthesia problems Neg Hx   . Hypotension Neg Hx   . Malignant hyperthermia Neg Hx   . Pseudochol deficiency Neg Hx     Current Facility-Administered Medications on File Prior to Encounter  Medication Dose Route Frequency Provider Last Rate Last Dose  . 0.9 %  sodium chloride infusion   Intravenous Continuous Conrad Providence, MD       Current Outpatient Prescriptions on File Prior to Encounter  Medication Sig Dispense Refill  . allopurinol (ZYLOPRIM) 100 MG tablet Take 100 mg by mouth 2 (two) times daily.     . carvedilol (COREG) 25 MG tablet Take 25 mg by mouth 2 (two) times daily with a meal.    . ferrous sulfate 325 (65 FE) MG EC tablet Take 1 tablet (325 mg total) by mouth 2 (two) times daily. 60 tablet 1  . furosemide (LASIX) 80 MG tablet Take 80 mg by mouth 2 (two) times daily.    .  hydrALAZINE (APRESOLINE) 100 MG tablet Take 50 mg by mouth 3 (three) times daily.     . megestrol (MEGACE) 20 MG tablet Take 1 tablet (20 mg total) by mouth 2 (two) times daily. 60 tablet 0  . Multiple Vitamin (MULTIVITAMIN) tablet Take 1 tablet by mouth daily. Alive daily    . pravastatin (PRAVACHOL) 40 MG tablet Take 40 mg by mouth daily.     Marland Kitchen RENVELA 800 MG tablet Take 1,600 mg by mouth 3 (three) times daily with meals.     Marland Kitchen HYDROcodone-acetaminophen (NORCO) 5-325 MG per tablet Take 2 tablets by mouth every 4 (four) hours as needed. (Patient not taking: Reported on 02/26/2014) 20 tablet 0    No Known Allergies  Review of Systems: As listed above, otherwise negative.  Physical Examination  Filed Vitals:   04/15/14 0551  BP: 155/81  Pulse: 73  Temp: 98.6 F (37 C)  TempSrc: Oral  Resp: 20  Height: 5\' 9"  (1.753 m)  Weight: 332 lb (150.594 kg)  SpO2: 100%    General: A&O x 3, WDWN  Pulmonary: Sym exp, good air movt, CTAB, no rales, rhonchi, & wheezing  Cardiac: RRR, Nl S1, S2, no Murmurs, rubs or gallops  Gastrointestinal: soft, NTND, -G/R, - HSM, - masses, - CVAT B  Musculoskeletal: M/S 5/5 throughout , Extremities without ischemic changes   Laboratory See iStat  Medical Decision Making  [name] is a 39 y.o. female who presents with: ESRD.   The patient is scheduled for: RUA AVG placement  Risk, benefits, and alternatives to access surgery were discussed.  The patient is aware the risks include but are not limited to: bleeding, infection, steal syndrome, nerve damage, ischemic monomelic neuropathy, failure to mature, and need for additional procedures.  The patient is aware of the risks and agrees to proceed.  Adele Barthel, MD [facility] [place] Office: [phone] Pager: [phone]  04/15/2014, 7:13 AM

## 2014-04-15 NOTE — Anesthesia Postprocedure Evaluation (Signed)
Anesthesia Post Note  Patient: [name]  Procedure(s) Performed: Procedure(s) (LRB): INSERTION OF ARTERIOVENOUS (AV) GORE-TEX GRAFT ARM (Right)  Anesthesia type: general  Patient location: PACU  Post pain: Pain level controlled  Post assessment: Patient's Cardiovascular Status Stable  Last Vitals:  Filed Vitals:   04/15/14 1105  BP: 164/79  Pulse: 79  Temp:   Resp: 18    Post vital signs: Reviewed and stable  Level of consciousness: sedated  Complications: No apparent anesthesia complications

## 2014-04-15 NOTE — Transfer of Care (Signed)
Immediate Anesthesia Transfer of Care Note  Patient: [name]  Procedure(s) Performed: Procedure(s): INSERTION OF ARTERIOVENOUS (AV) GORE-TEX GRAFT ARM (Right)  Patient Location: PACU  Anesthesia Type:General  Level of Consciousness: awake, alert  and oriented  Airway & Oxygen Therapy: Patient Spontanous Breathing and Patient connected to face mask oxygen  Post-op Assessment: Report given to RN, Post -op Vital signs reviewed and stable and Patient moving all extremities X 4  Post vital signs: Reviewed and stable  Last Vitals:  Filed Vitals:   04/15/14 0551  BP: 155/81  Pulse: 73  Temp: 37 C  Resp: 20    Complications: No apparent anesthesia complications

## 2014-04-15 NOTE — Anesthesia Procedure Notes (Signed)
Procedure Name: LMA Insertion Date/Time: 04/15/2014 7:50 AM Performed by: Gaylene Brooks Pre-anesthesia Checklist: Patient identified, Emergency Drugs available, Suction available, Patient being monitored and Timeout performed Patient Re-evaluated:Patient Re-evaluated prior to inductionOxygen Delivery Method: Circle system utilized Preoxygenation: Pre-oxygenation with 100% oxygen Intubation Type: IV induction LMA: LMA inserted LMA Size: 5.0 Number of attempts: 1 Placement Confirmation: positive ETCO2 and breath sounds checked- equal and bilateral Tube secured with: Tape Dental Injury: Teeth and Oropharynx as per pre-operative assessment

## 2014-04-15 NOTE — Progress Notes (Signed)
Maureen PA notified pt has swelling at incision site.  She will come see pt in pacu.

## 2014-04-16 ENCOUNTER — Encounter (HOSPITAL_COMMUNITY): Payer: Self-pay | Admitting: Vascular Surgery

## 2014-04-17 ENCOUNTER — Telehealth: Payer: Self-pay | Admitting: Vascular Surgery

## 2014-04-17 ENCOUNTER — Other Ambulatory Visit (HOSPITAL_COMMUNITY): Payer: Self-pay | Admitting: *Deleted

## 2014-04-17 NOTE — Telephone Encounter (Addendum)
LM for Melene to call and confirm appt, dpm  04/17/14: spoke with Suanne Marker to confirm, she requested more pain medication, so transferred call to [place], dpm

## 2014-04-17 NOTE — Telephone Encounter (Signed)
-----   Message from Denman George, RN sent at 04/15/2014  4:15 PM EDT ----- Regarding: Micheline Rough;  also needs 4 wk f/u with [facility]   ----- Message -----    From: Conrad Salem, MD    Sent: 04/15/2014  10:09 AM      To: [facility] [address]  [name] [id number] 05-08-1975  PROCEDURE:  right upper arm arteriovenous graft  Follow-up: 4 weeks

## 2014-04-18 ENCOUNTER — Telehealth: Payer: Self-pay

## 2014-04-18 ENCOUNTER — Encounter (HOSPITAL_COMMUNITY)
Admission: RE | Admit: 2014-04-18 | Discharge: 2014-04-18 | Disposition: A | Discharge status: Home / Self Care | Payer: BLUE CROSS/BLUE SHIELD | Source: Ambulatory Visit | Attending: Nephrology | Admitting: Nephrology

## 2014-04-18 DIAGNOSIS — N185 Chronic kidney disease, stage 5: Secondary | ICD-10-CM | POA: Diagnosis not present

## 2014-04-18 DIAGNOSIS — D631 Anemia in chronic kidney disease: Secondary | ICD-10-CM | POA: Diagnosis not present

## 2014-04-18 DIAGNOSIS — G8918 Other acute postprocedural pain: Secondary | ICD-10-CM

## 2014-04-18 LAB — FERRITIN: Ferritin: 138 ng/mL (ref 10–291)

## 2014-04-18 LAB — POCT HEMOGLOBIN-HEMACUE: Hemoglobin: 9.6 g/dL — ABNORMAL LOW (ref 12.0–15.0)

## 2014-04-18 LAB — IRON AND TIBC
IRON: 34 ug/dL — AB (ref 42–145)
Saturation Ratios: 13 % — ABNORMAL LOW (ref 20–55)
TIBC: 266 ug/dL (ref 250–470)
UIBC: 232 ug/dL (ref 125–400)

## 2014-04-18 MED ORDER — DARBEPOETIN ALFA 100 MCG/0.5ML IJ SOSY
PREFILLED_SYRINGE | INTRAMUSCULAR | Status: AC
  Administered 2014-04-18: 100 ug via SUBCUTANEOUS
  Filled 2014-04-18: qty 0.5

## 2014-04-18 MED ORDER — SODIUM CHLORIDE 0.9 % IV SOLN
510.0000 mg | Freq: Once | INTRAVENOUS | Status: AC
  Administered 2014-04-18: 510 mg via INTRAVENOUS
  Filled 2014-04-18: qty 17

## 2014-04-18 MED ORDER — DARBEPOETIN ALFA 100 MCG/0.5ML IJ SOSY
100.0000 ug | PREFILLED_SYRINGE | INTRAMUSCULAR | Status: DC
  Administered 2014-04-18: 100 ug via SUBCUTANEOUS

## 2014-04-18 MED ORDER — HYDROCODONE-ACETAMINOPHEN 10-325 MG PO TABS: 1.0000 | ORAL_TABLET | Freq: Four times a day (QID) | ORAL | Status: DC | PRN

## 2014-04-18 NOTE — Telephone Encounter (Signed)
Phone call from pt.  Reported the Hydrocodone/Acetaminophen is not helping with her post op pain at all.  Stated she gets intermittent sharp, stabbing pain.  Reported the incision is intact.  Denies any redness or drainage.  Reported some swelling present, and that she is elevating on pillow, at intervals.  Is requesting a different pain medication.  Stated she cannot take Tramadol, since this makes her itch.  Will discuss with Dr. Bridgett Larsson.

## 2014-04-18 NOTE — Telephone Encounter (Signed)
Rec'd verbal okay to increase pain medication to Hydrocodone 10 mg/ Acetaminophen 325 mg ; # 30; no refills.  Left pt. Voice message that printed Rx is available for pick-up at the office.

## 2014-05-06 ENCOUNTER — Ambulatory Visit: Payer: BLUE CROSS/BLUE SHIELD | Admitting: Obstetrics & Gynecology

## 2014-05-07 ENCOUNTER — Telehealth: Payer: Self-pay

## 2014-05-07 MED ORDER — MEGESTROL ACETATE 20 MG PO TABS: 20.0000 mg | ORAL_TABLET | Freq: Two times a day (BID) | ORAL | Status: DC

## 2014-05-07 NOTE — Telephone Encounter (Signed)
Patient called stating she is almost out of megace and needs refill sent to [facility] on [address].

## 2014-05-07 NOTE — Telephone Encounter (Signed)
Megace refill approved by Dr. Elly Modena. Called patient and informed her of refill at pharmacy-- patient verbalized understanding and gratitude. States she is still considering Mirena but had a stent placed recently and her MD would like that to completely heal before IUD placed. Informed patient this was OK and advised when she got the OK from other provider to call and schedule appointment. No further questions or concerns.

## 2014-05-15 ENCOUNTER — Other Ambulatory Visit (HOSPITAL_COMMUNITY): Payer: Self-pay | Admitting: *Deleted

## 2014-05-16 ENCOUNTER — Encounter (HOSPITAL_COMMUNITY)
Admission: RE | Admit: 2014-05-16 | Discharge: 2014-05-16 | Disposition: A | Discharge status: Home / Self Care | Payer: BLUE CROSS/BLUE SHIELD | Source: Ambulatory Visit | Attending: Nephrology | Admitting: Nephrology

## 2014-05-16 ENCOUNTER — Encounter: Payer: Self-pay | Admitting: Vascular Surgery

## 2014-05-16 DIAGNOSIS — D631 Anemia in chronic kidney disease: Secondary | ICD-10-CM | POA: Insufficient documentation

## 2014-05-16 DIAGNOSIS — N185 Chronic kidney disease, stage 5: Secondary | ICD-10-CM | POA: Insufficient documentation

## 2014-05-16 LAB — IRON AND TIBC
IRON: 49 ug/dL (ref 28–170)
Saturation Ratios: 17 % (ref 10.4–31.8)
TIBC: 297 ug/dL (ref 250–450)
UIBC: 248 ug/dL

## 2014-05-16 LAB — POCT HEMOGLOBIN-HEMACUE: Hemoglobin: 10.2 g/dL — ABNORMAL LOW (ref 12.0–15.0)

## 2014-05-16 LAB — FERRITIN: Ferritin: 197 ng/mL (ref 11–307)

## 2014-05-16 MED ORDER — FERUMOXYTOL INJECTION 510 MG/17 ML
510.0000 mg | Freq: Once | INTRAVENOUS | Status: AC
  Administered 2014-05-16: 510 mg via INTRAVENOUS
  Filled 2014-05-16: qty 17

## 2014-05-16 MED ORDER — DARBEPOETIN ALFA 100 MCG/0.5ML IJ SOSY
PREFILLED_SYRINGE | INTRAMUSCULAR | Status: AC
  Filled 2014-05-16: qty 0.5

## 2014-05-16 MED ORDER — DARBEPOETIN ALFA 100 MCG/0.5ML IJ SOSY
100.0000 ug | PREFILLED_SYRINGE | INTRAMUSCULAR | Status: DC
  Administered 2014-05-16: 100 ug via SUBCUTANEOUS

## 2014-05-17 ENCOUNTER — Encounter: Payer: Self-pay | Admitting: Vascular Surgery

## 2014-05-17 ENCOUNTER — Ambulatory Visit (INDEPENDENT_AMBULATORY_CARE_PROVIDER_SITE_OTHER): Payer: Self-pay | Admitting: Vascular Surgery

## 2014-05-17 VITALS — BP 136/71 | HR 79 | Temp 98.1°F | Resp 16 | Ht 69.5 in | Wt 337.0 lb

## 2014-05-17 DIAGNOSIS — N185 Chronic kidney disease, stage 5: Secondary | ICD-10-CM

## 2014-05-17 DIAGNOSIS — L299 Pruritus, unspecified: Secondary | ICD-10-CM | POA: Insufficient documentation

## 2014-05-17 MED ORDER — TRAMADOL HCL 50 MG PO TABS: 50.0000 mg | ORAL_TABLET | Freq: Four times a day (QID) | ORAL | Status: DC | PRN

## 2014-05-17 NOTE — Progress Notes (Signed)
    Postoperative Access Visit   History of Present Illness  [name] is a 39 y.o. year old female who presents for postoperative follow-up for: RUA AVG (Date: 04/15/14).  The patient's wounds are healed.  The patient notes no steal symptoms.  The patient is able to complete their activities of daily living.  The patient's current symptoms are: mild residual pain in incisions.  For VQI Use Only  PRE-ADM LIVING: Home  AMB STATUS: Ambulatory  Physical Examination Filed Vitals:   05/17/14 1145  BP: 136/71  Pulse: 79  Temp: 98.1 F (36.7 C)  Resp: 16    RUE: Incision is healed, skin feels warm, hand grip is 5/5, sensation in digits is intact, palpable thrill, bruit can be auscultated   Medical Decision Making  [name] is a 39 y.o. year old female who presents s/p RUA AVG.  The patient's access is ready for use.  Patient having some residual pain in prior 2nd stage transposition incision which keeps her awake at times.  Ultram 50 mg 1 po q6h prn pain #20 only, no refills.  Thank you for allowing [place] to participate in this patient's care.  Adele Barthel, MD [facility] [place] Office: [phone] Pager: [phone]  05/17/2014, 12:15 PM

## 2014-06-12 ENCOUNTER — Other Ambulatory Visit (HOSPITAL_COMMUNITY): Payer: Self-pay | Admitting: *Deleted

## 2014-06-13 ENCOUNTER — Encounter (HOSPITAL_COMMUNITY)
Admission: RE | Admit: 2014-06-13 | Discharge: 2014-06-13 | Disposition: A | Discharge status: Home / Self Care | Payer: BLUE CROSS/BLUE SHIELD | Source: Ambulatory Visit | Attending: Nephrology | Admitting: Nephrology

## 2014-06-13 DIAGNOSIS — N185 Chronic kidney disease, stage 5: Secondary | ICD-10-CM | POA: Insufficient documentation

## 2014-06-13 DIAGNOSIS — D631 Anemia in chronic kidney disease: Secondary | ICD-10-CM | POA: Diagnosis not present

## 2014-06-13 LAB — IRON AND TIBC
IRON: 86 ug/dL (ref 28–170)
Saturation Ratios: 30 % (ref 10.4–31.8)
TIBC: 291 ug/dL (ref 250–450)
UIBC: 205 ug/dL

## 2014-06-13 LAB — FERRITIN: FERRITIN: 295 ng/mL (ref 11–307)

## 2014-06-13 MED ORDER — DARBEPOETIN ALFA 100 MCG/0.5ML IJ SOSY
PREFILLED_SYRINGE | INTRAMUSCULAR | Status: AC
  Administered 2014-06-13: 100 ug via SUBCUTANEOUS
  Filled 2014-06-13: qty 0.5

## 2014-06-13 MED ORDER — DARBEPOETIN ALFA 100 MCG/0.5ML IJ SOSY
100.0000 ug | PREFILLED_SYRINGE | INTRAMUSCULAR | Status: DC
  Administered 2014-06-13: 100 ug via SUBCUTANEOUS

## 2014-06-14 LAB — POCT HEMOGLOBIN-HEMACUE: HEMOGLOBIN: 11.2 g/dL — AB (ref 12.0–15.0)

## 2014-07-02 ENCOUNTER — Telehealth: Payer: Self-pay | Admitting: General Practice

## 2014-07-02 DIAGNOSIS — N921 Excessive and frequent menstruation with irregular cycle: Secondary | ICD-10-CM

## 2014-07-02 MED ORDER — MEGESTROL ACETATE 20 MG PO TABS: 20.0000 mg | ORAL_TABLET | Freq: Two times a day (BID) | ORAL | Status: DC

## 2014-07-02 NOTE — Telephone Encounter (Signed)
Patient called and left message stating she is a patient of Dr Alease Medina and is almost out of her megestrol and needs a refill to [facility] on [address]. Dr Ihor Dow authorized refill. Per chart review patient was to return for IUD. Called patient-  No answer. Left message stating we are trying to reach you to return your phone call. We wanted to let you know we got your prescription refilled. Please contact our front office to schedule a follow up appt.

## 2014-07-10 ENCOUNTER — Encounter (HOSPITAL_COMMUNITY): Payer: BLUE CROSS/BLUE SHIELD

## 2014-07-15 ENCOUNTER — Encounter (HOSPITAL_COMMUNITY): Payer: BLUE CROSS/BLUE SHIELD

## 2014-07-18 ENCOUNTER — Encounter (HOSPITAL_COMMUNITY)
Admission: RE | Admit: 2014-07-18 | Discharge: 2014-07-18 | Disposition: A | Discharge status: Home / Self Care | Payer: BLUE CROSS/BLUE SHIELD | Source: Ambulatory Visit | Attending: Nephrology | Admitting: Nephrology

## 2014-07-18 DIAGNOSIS — D631 Anemia in chronic kidney disease: Secondary | ICD-10-CM | POA: Insufficient documentation

## 2014-07-18 DIAGNOSIS — N185 Chronic kidney disease, stage 5: Secondary | ICD-10-CM | POA: Diagnosis not present

## 2014-07-18 LAB — IRON AND TIBC
Iron: 41 ug/dL (ref 28–170)
SATURATION RATIOS: 14 % (ref 10.4–31.8)
TIBC: 283 ug/dL (ref 250–450)
UIBC: 242 ug/dL

## 2014-07-18 LAB — POCT HEMOGLOBIN-HEMACUE: HEMOGLOBIN: 11.1 g/dL — AB (ref 12.0–15.0)

## 2014-07-18 LAB — FERRITIN: FERRITIN: 253 ng/mL (ref 11–307)

## 2014-07-18 MED ORDER — DARBEPOETIN ALFA 100 MCG/0.5ML IJ SOSY
100.0000 ug | PREFILLED_SYRINGE | INTRAMUSCULAR | Status: DC
  Administered 2014-07-18: 100 ug via SUBCUTANEOUS

## 2014-07-18 MED ORDER — DARBEPOETIN ALFA 100 MCG/0.5ML IJ SOSY
PREFILLED_SYRINGE | INTRAMUSCULAR | Status: AC
  Filled 2014-07-18: qty 0.5

## 2014-08-14 ENCOUNTER — Other Ambulatory Visit (HOSPITAL_COMMUNITY): Payer: Self-pay | Admitting: *Deleted

## 2014-08-15 ENCOUNTER — Inpatient Hospital Stay (HOSPITAL_COMMUNITY): Admission: RE | Admit: 2014-08-15 | Payer: Self-pay | Source: Ambulatory Visit

## 2014-08-20 ENCOUNTER — Other Ambulatory Visit (HOSPITAL_COMMUNITY): Payer: Self-pay | Admitting: *Deleted

## 2014-08-21 ENCOUNTER — Encounter (HOSPITAL_COMMUNITY)
Admission: RE | Admit: 2014-08-21 | Discharge: 2014-08-21 | Disposition: A | Discharge status: Home / Self Care | Payer: BLUE CROSS/BLUE SHIELD | Source: Ambulatory Visit | Attending: Nephrology | Admitting: Nephrology

## 2014-08-21 DIAGNOSIS — N185 Chronic kidney disease, stage 5: Secondary | ICD-10-CM | POA: Insufficient documentation

## 2014-08-21 DIAGNOSIS — D631 Anemia in chronic kidney disease: Secondary | ICD-10-CM | POA: Insufficient documentation

## 2014-08-21 LAB — IRON AND TIBC
Iron: 64 ug/dL (ref 28–170)
Saturation Ratios: 24 % (ref 10.4–31.8)
TIBC: 269 ug/dL (ref 250–450)
UIBC: 205 ug/dL

## 2014-08-21 LAB — FERRITIN: Ferritin: 281 ng/mL (ref 11–307)

## 2014-08-21 LAB — POCT HEMOGLOBIN-HEMACUE: Hemoglobin: 10.8 g/dL — ABNORMAL LOW (ref 12.0–15.0)

## 2014-08-21 MED ORDER — DARBEPOETIN ALFA 100 MCG/0.5ML IJ SOSY
100.0000 ug | PREFILLED_SYRINGE | INTRAMUSCULAR | Status: DC
  Administered 2014-08-21: 100 ug via SUBCUTANEOUS

## 2014-08-21 MED ORDER — SODIUM CHLORIDE 0.9 % IV SOLN
510.0000 mg | Freq: Once | INTRAVENOUS | Status: AC
  Administered 2014-08-21: 510 mg via INTRAVENOUS
  Filled 2014-08-21: qty 17

## 2014-08-21 MED ORDER — DARBEPOETIN ALFA 100 MCG/0.5ML IJ SOSY
PREFILLED_SYRINGE | INTRAMUSCULAR | Status: AC
  Filled 2014-08-21: qty 0.5

## 2014-09-18 ENCOUNTER — Inpatient Hospital Stay (HOSPITAL_COMMUNITY): Admission: RE | Admit: 2014-09-18 | Payer: BLUE CROSS/BLUE SHIELD | Source: Ambulatory Visit

## 2014-09-18 ENCOUNTER — Telehealth: Payer: Self-pay | Admitting: *Deleted

## 2014-09-18 DIAGNOSIS — N921 Excessive and frequent menstruation with irregular cycle: Secondary | ICD-10-CM

## 2014-09-18 MED ORDER — MEGESTROL ACETATE 20 MG PO TABS: 20.0000 mg | ORAL_TABLET | Freq: Two times a day (BID) | ORAL | Status: DC

## 2014-09-18 NOTE — Telephone Encounter (Signed)
Pt left message requesting refill of her Megace. She has only 4 pills left. She stated that she is waiting for her insurance to come in the mail and then will make am appt.

## 2014-09-18 NOTE — Telephone Encounter (Signed)
Per Dr Harolyn Rutherford, may refill medication. Called patient and asked if she currently had insurance. Patient states yes but she is working on getting a new card and is unsure what her co pay is. Told patient she needs to make an appt in our office to return for the IUD because she will not get an appt till October and by then her insurance should be worked out. Patient verbalized understanding and states she recently had surgery on her arm and she has a PICC line. Patient states her doctor wanted her to wait on the IUD until things started to improve. Patient states her follow up appt with them should be coming up soon. Told patient she still needs an appt in our office and then if she needs to change the appt she can. Patient verbalized understanding. Informed patient of refill sent to pharmacy and asked if she was taking the medication daily. Patient states yes. Told patient our front office will contact her to set up an appt. Patient verbalized understanding and had no questions

## 2014-09-26 ENCOUNTER — Encounter (HOSPITAL_COMMUNITY)
Admission: RE | Admit: 2014-09-26 | Discharge: 2014-09-26 | Disposition: A | Discharge status: Home / Self Care | Payer: Self-pay | Source: Ambulatory Visit | Attending: Nephrology | Admitting: Nephrology

## 2014-09-26 DIAGNOSIS — D631 Anemia in chronic kidney disease: Secondary | ICD-10-CM | POA: Insufficient documentation

## 2014-09-26 DIAGNOSIS — N185 Chronic kidney disease, stage 5: Secondary | ICD-10-CM | POA: Insufficient documentation

## 2014-09-26 LAB — IRON AND TIBC
Iron: 45 ug/dL (ref 28–170)
SATURATION RATIOS: 16 % (ref 10.4–31.8)
TIBC: 276 ug/dL (ref 250–450)
UIBC: 231 ug/dL

## 2014-09-26 LAB — POCT HEMOGLOBIN-HEMACUE: HEMOGLOBIN: 10.5 g/dL — AB (ref 12.0–15.0)

## 2014-09-26 LAB — FERRITIN: Ferritin: 398 ng/mL — ABNORMAL HIGH (ref 11–307)

## 2014-09-26 MED ORDER — DARBEPOETIN ALFA 100 MCG/0.5ML IJ SOSY
100.0000 ug | PREFILLED_SYRINGE | INTRAMUSCULAR | Status: DC
  Administered 2014-09-26: 100 ug via SUBCUTANEOUS

## 2014-09-26 MED ORDER — DARBEPOETIN ALFA 100 MCG/0.5ML IJ SOSY
PREFILLED_SYRINGE | INTRAMUSCULAR | Status: AC
  Filled 2014-09-26: qty 0.5

## 2014-10-09 ENCOUNTER — Encounter: Payer: Self-pay | Admitting: Vascular Surgery

## 2014-10-11 ENCOUNTER — Ambulatory Visit (INDEPENDENT_AMBULATORY_CARE_PROVIDER_SITE_OTHER): Payer: 59 | Admitting: Vascular Surgery

## 2014-10-11 ENCOUNTER — Ambulatory Visit: Payer: Self-pay | Admitting: Obstetrics & Gynecology

## 2014-10-11 ENCOUNTER — Encounter: Payer: Self-pay | Admitting: Vascular Surgery

## 2014-10-11 ENCOUNTER — Other Ambulatory Visit: Payer: Self-pay

## 2014-10-11 ENCOUNTER — Ambulatory Visit: Payer: Self-pay | Admitting: Vascular Surgery

## 2014-10-11 VITALS — BP 116/40 | HR 56 | Temp 98.1°F | Resp 16 | Ht 69.5 in | Wt 329.0 lb

## 2014-10-11 DIAGNOSIS — N186 End stage renal disease: Secondary | ICD-10-CM

## 2014-10-11 NOTE — Progress Notes (Signed)
Established Dialysis Access  History of Present Illness  [name] is a 39 y.o. ([birth year]) female who presents for re-evaluation or RUA AVG.  Reportedly the R UA AVG bruit has become progressively weaker.  The patient denies any steal sx.  She denies laying on the right arm.  She has not started hemodialysis.  This RUA AVG was placed on 04/15/14.  Past Medical History  Diagnosis Date  . Hypertension   . Thyroid disease   . Morbid obesity ([place])   . Hyperlipidemia   . History of pneumonia   . Foot fracture   . Dilated cardiomyopathy ([place])   . CHF (congestive heart failure) ([place])     hosp. 2011  . Gout   . Anxiety     pt not aware of this  . Arthritis   . Anemia   . Menorrhagia   . Chronic kidney disease     not on dialysis at this time  . Encounter for blood transfusion   . History of blood transfusion   . Shortness of breath     seldom  . Pneumonia     hosp. 2010  . Heart murmur     as a child    Past Surgical History  Procedure Laterality Date  . Cardiac surgery  1979    repair of hole in heart  . Av fistula placement  08/04/2011    Procedure: ARTERIOVENOUS (AV) FISTULA CREATION;  Surgeon: Elam Dutch, MD;  Location: [facility] OR;  Service: Vascular;  Laterality: Right;  Creation right brachiocephalic arteriovenous fistula  . Av fistula placement Left 02/05/2013    Procedure: ARTERIOVENOUS FISTULA CREATION LEFT ARM;  Surgeon: Conrad [place], MD;  Location: [place];  Service: Vascular;  Laterality: Left;  . Thrombectomy w/ embolectomy Left 03/05/2013    Procedure: THROMBECTOMY OF LEFT BRACHIAL ARTERY;  Surgeon: Conrad [place], MD;  Location: [place];  Service: Vascular;  Laterality: Left;  . Ligation of arteriovenous  fistula Left 03/05/2013    Procedure: LIGATION OF ARTERIOVENOUS  FISTULA;  Surgeon: Conrad Heber-Overgaard, MD;  Location: [place];  Service: Vascular;  Laterality: Left;  . Patch angioplasty Left 03/05/2013    Procedure: PATCH ANGIOPLASTY;  Surgeon: Conrad [place], MD;   Location: [place];  Service: Vascular;  Laterality: Left;  . Av fistula placement Right 06/04/2013    Procedure: ARTERIOVENOUS (AV) FISTULA CREATION;  Surgeon: Conrad [place], MD;  Location: [place];  Service: Vascular;  Laterality: Right;  . Bascilic vein transposition Right 08/13/2013    Procedure: Second Stage Brachial Vein Transposition;  Surgeon: Conrad [place], MD;  Location: [place];  Service: Vascular;  Laterality: Right;  . Venogram Bilateral 03/25/2014    Procedure: VENOGRAM bilateral;  Surgeon: Conrad [place], MD;  Location: [facility] CATH LAB;  Service: Cardiovascular;  Laterality: Bilateral;  . Av fistula placement Right 04/15/2014    Procedure: INSERTION OF ARTERIOVENOUS (AV) GORE-TEX GRAFT ARM;  Surgeon: Conrad Ekron, MD;  Location: [facility] OR;  Service: Vascular;  Laterality: Right;    Social History   Social History  . Marital Status: Married    Spouse Name: N/A  . Number of Children: N/A  . Years of Education: N/A   Occupational History  . Not on file.   Social History Main Topics  . Smoking status: Never Smoker   . Smokeless tobacco: Never Used  . Alcohol Use: No  . Drug Use: No  . Sexual Activity: Yes  Birth Control/ Protection: None     Comment: no birth control    Other Topics Concern  . Not on file   Social History Narrative    Family History  Problem Relation Age of Onset  . Diabetes Mother   . Hyperlipidemia Mother   . Hypertension Mother   . Kidney disease Mother   . Anesthesia problems Neg Hx   . Hypotension Neg Hx   . Malignant hyperthermia Neg Hx   . Pseudochol deficiency Neg Hx      Current Outpatient Prescriptions  Medication Sig Dispense Refill  . allopurinol (ZYLOPRIM) 100 MG tablet Take 100 mg by mouth 2 (two) times daily.     . carvedilol (COREG) 25 MG tablet Take 25 mg by mouth 2 (two) times daily with a meal.    . ferrous sulfate 325 (65 FE) MG EC tablet Take 1 tablet (325 mg total) by mouth 2 (two) times daily. 60 tablet 1  . furosemide (LASIX) 80 MG  tablet Take 80 mg by mouth 2 (two) times daily.    . hydrALAZINE (APRESOLINE) 100 MG tablet Take 50 mg by mouth 3 (three) times daily.     . megestrol (MEGACE) 20 MG tablet Take 1 tablet (20 mg total) by mouth 2 (two) times daily. 60 tablet 0  . pravastatin (PRAVACHOL) 40 MG tablet Take 40 mg by mouth daily.     Marland Kitchen RENVELA 800 MG tablet Take 1,600 mg by mouth 3 (three) times daily with meals.     Marland Kitchen HYDROcodone-acetaminophen (NORCO) 10-325 MG per tablet Take 1 tablet by mouth every 6 (six) hours as needed. (Patient not taking: Reported on 10/11/2014) 30 tablet 0  . HYDROcodone-acetaminophen (NORCO/VICODIN) 5-325 MG per tablet     . misoprostol (CYTOTEC) 200 MCG tablet     . Multiple Vitamin (MULTIVITAMIN) tablet Take 1 tablet by mouth daily. Alive daily    . oxyCODONE-acetaminophen (PERCOCET/ROXICET) 5-325 MG per tablet     . traMADol (ULTRAM) 50 MG tablet Take 1 tablet (50 mg total) by mouth every 6 (six) hours as needed. (Patient not taking: Reported on 10/11/2014) 20 tablet 0   No current facility-administered medications for this visit.   Facility-Administered Medications Ordered in Other Visits  Medication Dose Route Frequency Provider Last Rate Last Dose  . 0.9 %  sodium chloride infusion   Intravenous Continuous Conrad Fielding, MD         No Known Allergies   REVIEW OF SYSTEMS:  (Positives checked otherwise negative)  CARDIOVASCULAR:   [ ]  chest pain,  [ ]  chest pressure,  [ ]  palpitations,  [ ]  shortness of breath when laying flat,  [ ]  shortness of breath with exertion,   [ ]  pain in feet when walking,  [ ]  pain in feet when laying flat, [ ]  history of blood clot in veins (DVT),  [ ]  history of phlebitis,  [ ]  swelling in legs,  [ ]  varicose veins  PULMONARY:   [ ]  productive cough,  [ ]  asthma,  [ ]  wheezing  NEUROLOGIC:   [ ]  weakness in arms or legs,  [ ]  numbness in arms or legs,  [ ]  difficulty speaking or slurred speech,  [ ]  temporary loss of vision in one eye,   [ ]  dizziness  HEMATOLOGIC:   [ ]  bleeding problems,  [ ]  problems with blood clotting too easily  MUSCULOSKEL:   [ ]  joint pain, [ ]  joint swelling  GASTROINTEST:   [ ]   vomiting blood,  [ ]  blood in stool     GENITOURINARY:   [ ]  burning with urination,  [ ]  blood in urine  PSYCHIATRIC:   [ ]  history of major depression  INTEGUMENTARY:   [ ]  rashes,  [ ]  ulcers  CONSTITUTIONAL:   [ ]  fever,  [ ]  chills     Physical Examination  Filed Vitals:   10/11/14 1048  BP: 116/40  Pulse: 56  Temp: 98.1 F (36.7 C)  Resp: 16  Height: 5' 9.5" (1.765 m)  Weight: 329 lb (149.233 kg)  SpO2: 100%   Body mass index is 47.9 kg/(m^2).  General: A&O x 3, WD, morbidly obese  Pulmonary: Sym exp, good air movt, CTAB, no rales, rhonchi, & wheezing  Cardiac: RRR, Nl S1, S2, no Murmurs, rubs or gallops  Vascular: Vessel Right Left  Radial Faintly Palpable Palpable  Ulnar Not Palpable Not Palpable  Brachial Palpable Palpable   Gastrointestinal: soft, NTND, no G/R, bo HSM, no masses, no CVAT B  Musculoskeletal: M/S 5/5 throughout , Extremities without  ischemic changes , palpable thrill in access in RUA: difficult to palpate proximally in the arm, + bruit in access  Neurologic: Pain and light touch intact in extremities , Motor exam as listed above   Medical Decision Making  [name] is a 39 y.o. female who presents with chronic kidney disease stage IV-V   I recommend: R arm shuntogram to evaluate the graft.   I discussed with the patient the nature of angiographic procedures, especially the limited patencies of any endovascular intervention.  The patient is aware of that the risks of an angiographic procedure include but are not limited to: bleeding, infection, access site complications, renal failure, embolization, rupture of vessel, dissection, possible need for emergent surgical intervention, possible need for surgical procedures to treat the patient's  pathology, anaphylactic reaction to contrast, and stroke and death.    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  The patient has agreed to proceed with the above procedure which will be scheduled this coming Thursday.Adele Barthel, MD [facility] [place] Office: [phone] Pager: [phone]  10/11/2014, 1:29 PM

## 2014-10-17 ENCOUNTER — Encounter (HOSPITAL_COMMUNITY): Admission: RE | Payer: Self-pay | Source: Ambulatory Visit

## 2014-10-17 ENCOUNTER — Ambulatory Visit (HOSPITAL_COMMUNITY): Admission: RE | Admit: 2014-10-17 | Payer: 59 | Source: Ambulatory Visit | Admitting: Vascular Surgery

## 2014-10-17 SURGERY — A/V SHUNTOGRAM/FISTULAGRAM
Anesthesia: LOCAL

## 2014-10-23 ENCOUNTER — Other Ambulatory Visit (HOSPITAL_COMMUNITY): Payer: Self-pay | Admitting: *Deleted

## 2014-10-24 ENCOUNTER — Encounter (HOSPITAL_COMMUNITY)
Admission: RE | Admit: 2014-10-24 | Discharge: 2014-10-24 | Disposition: A | Discharge status: Home / Self Care | Payer: Self-pay | Source: Ambulatory Visit | Attending: Nephrology | Admitting: Nephrology

## 2014-10-24 DIAGNOSIS — D631 Anemia in chronic kidney disease: Secondary | ICD-10-CM | POA: Insufficient documentation

## 2014-10-24 DIAGNOSIS — N185 Chronic kidney disease, stage 5: Secondary | ICD-10-CM | POA: Insufficient documentation

## 2014-10-24 LAB — POCT HEMOGLOBIN-HEMACUE: Hemoglobin: 11.2 g/dL — ABNORMAL LOW (ref 12.0–15.0)

## 2014-10-24 LAB — IRON AND TIBC
IRON: 43 ug/dL (ref 28–170)
SATURATION RATIOS: 16 % (ref 10.4–31.8)
TIBC: 266 ug/dL (ref 250–450)
UIBC: 223 ug/dL

## 2014-10-24 LAB — FERRITIN: FERRITIN: 354 ng/mL — AB (ref 11–307)

## 2014-10-24 MED ORDER — SODIUM CHLORIDE 0.9 % IV SOLN
510.0000 mg | Freq: Once | INTRAVENOUS | Status: AC
  Administered 2014-10-24: 510 mg via INTRAVENOUS
  Filled 2014-10-24: qty 17

## 2014-10-24 MED ORDER — DARBEPOETIN ALFA 100 MCG/0.5ML IJ SOSY
PREFILLED_SYRINGE | INTRAMUSCULAR | Status: AC
  Filled 2014-10-24: qty 0.5

## 2014-10-24 MED ORDER — DARBEPOETIN ALFA 100 MCG/0.5ML IJ SOSY
100.0000 ug | PREFILLED_SYRINGE | INTRAMUSCULAR | Status: DC
  Administered 2014-10-24: 100 ug via SUBCUTANEOUS

## 2014-10-31 ENCOUNTER — Ambulatory Visit (HOSPITAL_COMMUNITY)
Admission: RE | Admit: 2014-10-31 | Discharge: 2014-10-31 | Disposition: A | Discharge status: Home / Self Care | Payer: 59 | Source: Ambulatory Visit | Attending: Vascular Surgery | Admitting: Vascular Surgery

## 2014-10-31 ENCOUNTER — Encounter (HOSPITAL_COMMUNITY): Admission: RE | Payer: Self-pay | Source: Ambulatory Visit

## 2014-10-31 ENCOUNTER — Ambulatory Visit (HOSPITAL_COMMUNITY): Admission: RE | Admit: 2014-10-31 | Payer: 59 | Source: Ambulatory Visit | Admitting: Vascular Surgery

## 2014-10-31 ENCOUNTER — Other Ambulatory Visit: Payer: Self-pay | Admitting: *Deleted

## 2014-10-31 ENCOUNTER — Encounter (HOSPITAL_COMMUNITY)
Admission: RE | Disposition: A | Discharge status: Home / Self Care | Payer: Self-pay | Source: Ambulatory Visit | Attending: Vascular Surgery

## 2014-10-31 DIAGNOSIS — I420 Dilated cardiomyopathy: Secondary | ICD-10-CM | POA: Diagnosis not present

## 2014-10-31 DIAGNOSIS — N186 End stage renal disease: Secondary | ICD-10-CM

## 2014-10-31 DIAGNOSIS — T82858A Stenosis of vascular prosthetic devices, implants and grafts, initial encounter: Secondary | ICD-10-CM | POA: Diagnosis not present

## 2014-10-31 DIAGNOSIS — Y832 Surgical operation with anastomosis, bypass or graft as the cause of abnormal reaction of the patient, or of later complication, without mention of misadventure at the time of the procedure: Secondary | ICD-10-CM | POA: Diagnosis not present

## 2014-10-31 DIAGNOSIS — I130 Hypertensive heart and chronic kidney disease with heart failure and stage 1 through stage 4 chronic kidney disease, or unspecified chronic kidney disease: Secondary | ICD-10-CM | POA: Insufficient documentation

## 2014-10-31 DIAGNOSIS — N184 Chronic kidney disease, stage 4 (severe): Secondary | ICD-10-CM | POA: Insufficient documentation

## 2014-10-31 DIAGNOSIS — I509 Heart failure, unspecified: Secondary | ICD-10-CM | POA: Insufficient documentation

## 2014-10-31 DIAGNOSIS — E785 Hyperlipidemia, unspecified: Secondary | ICD-10-CM | POA: Insufficient documentation

## 2014-10-31 DIAGNOSIS — Z79899 Other long term (current) drug therapy: Secondary | ICD-10-CM | POA: Insufficient documentation

## 2014-10-31 DIAGNOSIS — Z79891 Long term (current) use of opiate analgesic: Secondary | ICD-10-CM | POA: Diagnosis not present

## 2014-10-31 DIAGNOSIS — M109 Gout, unspecified: Secondary | ICD-10-CM | POA: Insufficient documentation

## 2014-10-31 DIAGNOSIS — T82590A Other mechanical complication of surgically created arteriovenous fistula, initial encounter: Secondary | ICD-10-CM | POA: Diagnosis present

## 2014-10-31 DIAGNOSIS — T82898A Other specified complication of vascular prosthetic devices, implants and grafts, initial encounter: Secondary | ICD-10-CM | POA: Diagnosis not present

## 2014-10-31 DIAGNOSIS — Z4931 Encounter for adequacy testing for hemodialysis: Secondary | ICD-10-CM

## 2014-10-31 DIAGNOSIS — N185 Chronic kidney disease, stage 5: Secondary | ICD-10-CM | POA: Diagnosis present

## 2014-10-31 HISTORY — PX: PERIPHERAL VASCULAR CATHETERIZATION: SHX172C

## 2014-10-31 LAB — HCG, QUANTITATIVE, PREGNANCY: HCG, BETA CHAIN, QUANT, S: 2 m[IU]/mL (ref <5)

## 2014-10-31 LAB — POCT I-STAT, CHEM 8
BUN: 54 mg/dL — AB (ref 6–20)
CALCIUM ION: 1.27 mmol/L — AB (ref 1.12–1.23)
Chloride: 107 mmol/L (ref 101–111)
Creatinine, Ser: 7.5 mg/dL — ABNORMAL HIGH (ref 0.44–1.00)
Glucose, Bld: 84 mg/dL (ref 65–99)
HCT: 35 % — ABNORMAL LOW (ref 36.0–46.0)
Hemoglobin: 11.9 g/dL — ABNORMAL LOW (ref 12.0–15.0)
Potassium: 3.6 mmol/L (ref 3.5–5.1)
SODIUM: 143 mmol/L (ref 135–145)
TCO2: 24 mmol/L (ref 0–100)

## 2014-10-31 SURGERY — A/V SHUNTOGRAM/FISTULAGRAM

## 2014-10-31 SURGERY — A/V SHUNTOGRAM/FISTULAGRAM
Anesthesia: LOCAL | Laterality: Right

## 2014-10-31 MED ORDER — ACETAMINOPHEN 325 MG PO TABS: 650.0000 mg | ORAL_TABLET | ORAL | Status: DC | PRN

## 2014-10-31 MED ORDER — HEPARIN (PORCINE) IN NACL 2-0.9 UNIT/ML-% IJ SOLN
INTRAMUSCULAR | Status: DC | PRN
  Administered 2014-10-31: 8 mL

## 2014-10-31 MED ORDER — LIDOCAINE HCL (PF) 1 % IJ SOLN
INTRAMUSCULAR | Status: AC
  Filled 2014-10-31: qty 30

## 2014-10-31 MED ORDER — HEPARIN SODIUM (PORCINE) 1000 UNIT/ML IJ SOLN
INTRAMUSCULAR | Status: DC | PRN
  Administered 2014-10-31: 3000 [IU] via INTRAVENOUS

## 2014-10-31 MED ORDER — HEPARIN (PORCINE) IN NACL 2-0.9 UNIT/ML-% IJ SOLN
INTRAMUSCULAR | Status: AC
  Filled 2014-10-31: qty 500

## 2014-10-31 MED ORDER — SODIUM CHLORIDE 0.9 % IJ SOLN: 3.0000 mL | INTRAMUSCULAR | Status: DC | PRN

## 2014-10-31 MED ORDER — IODIXANOL 320 MG/ML IV SOLN
INTRAVENOUS | Status: DC | PRN
  Administered 2014-10-31: 35 mL via INTRAVENOUS

## 2014-10-31 MED ORDER — SODIUM CHLORIDE 0.9 % IV SOLN: 250.0000 mL | INTRAVENOUS | Status: DC | PRN

## 2014-10-31 MED ORDER — HEPARIN SODIUM (PORCINE) 1000 UNIT/ML IJ SOLN
INTRAMUSCULAR | Status: AC
  Filled 2014-10-31: qty 1

## 2014-10-31 MED ORDER — SODIUM CHLORIDE 0.9 % IJ SOLN: 3.0000 mL | Freq: Two times a day (BID) | INTRAMUSCULAR | Status: DC

## 2014-10-31 SURGICAL SUPPLY — 15 items
BAG SNAP BAND KOVER 36X36 (MISCELLANEOUS) ×2 IMPLANT
BALLN MUSTANG 6.0X40 75 (BALLOONS) ×2
BALLOON MUSTANG 6.0X40 75 (BALLOONS) IMPLANT
COVER DOME SNAP 22 D (MISCELLANEOUS) ×2 IMPLANT
COVER PRB 48X5XTLSCP FOLD TPE (BAG) ×1 IMPLANT
COVER PROBE 5X48 (BAG) ×2
KIT ENCORE 26 ADVANTAGE (KITS) ×1 IMPLANT
KIT MICROINTRODUCER STIFF 5F (SHEATH) ×2 IMPLANT
PROTECTION STATION PRESSURIZED (MISCELLANEOUS) ×2
SHEATH PINNACLE R/O II 6F 4CM (SHEATH) ×1 IMPLANT
STATION PROTECTION PRESSURIZED (MISCELLANEOUS) ×1 IMPLANT
STOPCOCK MORSE 400PSI 3WAY (MISCELLANEOUS) ×2 IMPLANT
TRAY PV CATH (CUSTOM PROCEDURE TRAY) ×2 IMPLANT
TUBING CIL FLEX 10 FLL-RA (TUBING) ×2 IMPLANT
WIRE BENTSON .035X145CM (WIRE) ×2 IMPLANT

## 2014-10-31 NOTE — H&P (View-Only) (Signed)
Established Dialysis Access  History of Present Illness  [name] is a 39 y.o. (September 23, 1975) female who presents for re-evaluation or RUA AVG.  Reportedly the R UA AVG bruit has become progressively weaker.  The patient denies any steal sx.  She denies laying on the right arm.  She has not started hemodialysis.  This RUA AVG was placed on 04/15/14.  Past Medical History  Diagnosis Date  . Hypertension   . Thyroid disease   . Morbid obesity ([place])   . Hyperlipidemia   . History of pneumonia   . Foot fracture   . Dilated cardiomyopathy ([place])   . CHF (congestive heart failure) ([place])     hosp. 2011  . Gout   . Anxiety     pt not aware of this  . Arthritis   . Anemia   . Menorrhagia   . Chronic kidney disease     not on dialysis at this time  . Encounter for blood transfusion   . History of blood transfusion   . Shortness of breath     seldom  . Pneumonia     hosp. 2010  . Heart murmur     as a child    Past Surgical History  Procedure Laterality Date  . Cardiac surgery  1979    repair of hole in heart  . Av fistula placement  08/04/2011    Procedure: ARTERIOVENOUS (AV) FISTULA CREATION;  Surgeon: Elam Dutch, MD;  Location: [facility] OR;  Service: Vascular;  Laterality: Right;  Creation right brachiocephalic arteriovenous fistula  . Av fistula placement Left 02/05/2013    Procedure: ARTERIOVENOUS FISTULA CREATION LEFT ARM;  Surgeon: Conrad Lathrop, MD;  Location: [place];  Service: Vascular;  Laterality: Left;  . Thrombectomy w/ embolectomy Left 03/05/2013    Procedure: THROMBECTOMY OF LEFT BRACHIAL ARTERY;  Surgeon: Conrad Valparaiso, MD;  Location: [place];  Service: Vascular;  Laterality: Left;  . Ligation of arteriovenous  fistula Left 03/05/2013    Procedure: LIGATION OF ARTERIOVENOUS  FISTULA;  Surgeon: Conrad Moose [place] Road, MD;  Location: [place];  Service: Vascular;  Laterality: Left;  . Patch angioplasty Left 03/05/2013    Procedure: PATCH ANGIOPLASTY;  Surgeon: Conrad Sur[place], MD;   Location: [place];  Service: Vascular;  Laterality: Left;  . Av fistula placement Right 06/04/2013    Procedure: ARTERIOVENOUS (AV) FISTULA CREATION;  Surgeon: Conrad Haakon, MD;  Location: [place];  Service: Vascular;  Laterality: Right;  . Bascilic vein transposition Right 08/13/2013    Procedure: Second Stage Brachial Vein Transposition;  Surgeon: Conrad Lake T[place], MD;  Location: [place];  Service: Vascular;  Laterality: Right;  . Venogram Bilateral 03/25/2014    Procedure: VENOGRAM bilateral;  Surgeon: Conrad [place] Park, MD;  Location: [facility] CATH LAB;  Service: Cardiovascular;  Laterality: Bilateral;  . Av fistula placement Right 04/15/2014    Procedure: INSERTION OF ARTERIOVENOUS (AV) GORE-TEX GRAFT ARM;  Surgeon: Conrad [place]is[place], MD;  Location: [facility] OR;  Service: Vascular;  Laterality: Right;    Social History   Social History  . Marital Status: Married    Spouse Name: N/A  . Number of Children: N/A  . Years of Education: N/A   Occupational History  . Not on file.   Social History Main Topics  . Smoking status: Never Smoker   . Smokeless tobacco: Never Used  . Alcohol Use: No  . Drug Use: No  . Sexual Activity: Yes  Birth Control/ Protection: None     Comment: no birth control    Other Topics Concern  . Not on file   Social History Narrative    Family History  Problem Relation Age of Onset  . Diabetes Mother   . Hyperlipidemia Mother   . Hypertension Mother   . Kidney disease Mother   . Anesthesia problems Neg Hx   . Hypotension Neg Hx   . Malignant hyperthermia Neg Hx   . Pseudochol deficiency Neg Hx      Current Outpatient Prescriptions  Medication Sig Dispense Refill  . allopurinol (ZYLOPRIM) 100 MG tablet Take 100 mg by mouth 2 (two) times daily.     . carvedilol (COREG) 25 MG tablet Take 25 mg by mouth 2 (two) times daily with a meal.    . ferrous sulfate 325 (65 FE) MG EC tablet Take 1 tablet (325 mg total) by mouth 2 (two) times daily. 60 tablet 1  . furosemide (LASIX) 80 MG  tablet Take 80 mg by mouth 2 (two) times daily.    . hydrALAZINE (APRESOLINE) 100 MG tablet Take 50 mg by mouth 3 (three) times daily.     . megestrol (MEGACE) 20 MG tablet Take 1 tablet (20 mg total) by mouth 2 (two) times daily. 60 tablet 0  . pravastatin (PRAVACHOL) 40 MG tablet Take 40 mg by mouth daily.     Marland Kitchen RENVELA 800 MG tablet Take 1,600 mg by mouth 3 (three) times daily with meals.     Marland Kitchen HYDROcodone-acetaminophen (NORCO) 10-325 MG per tablet Take 1 tablet by mouth every 6 (six) hours as needed. (Patient not taking: Reported on 10/11/2014) 30 tablet 0  . HYDROcodone-acetaminophen (NORCO/VICODIN) 5-325 MG per tablet     . misoprostol (CYTOTEC) 200 MCG tablet     . Multiple Vitamin (MULTIVITAMIN) tablet Take 1 tablet by mouth daily. Alive daily    . oxyCODONE-acetaminophen (PERCOCET/ROXICET) 5-325 MG per tablet     . traMADol (ULTRAM) 50 MG tablet Take 1 tablet (50 mg total) by mouth every 6 (six) hours as needed. (Patient not taking: Reported on 10/11/2014) 20 tablet 0   No current facility-administered medications for this visit.   Facility-Administered Medications Ordered in Other Visits  Medication Dose Route Frequency Provider Last Rate Last Dose  . 0.9 %  sodium chloride infusion   Intravenous Continuous Conrad [place], MD         No Known Allergies   REVIEW OF SYSTEMS:  (Positives checked otherwise negative)  CARDIOVASCULAR:   [ ]  chest pain,  [ ]  chest pressure,  [ ]  palpitations,  [ ]  shortness of breath when laying flat,  [ ]  shortness of breath with exertion,   [ ]  pain in feet when walking,  [ ]  pain in feet when laying flat, [ ]  history of blood clot in veins (DVT),  [ ]  history of phlebitis,  [ ]  swelling in legs,  [ ]  varicose veins  PULMONARY:   [ ]  productive cough,  [ ]  asthma,  [ ]  wheezing  NEUROLOGIC:   [ ]  weakness in arms or legs,  [ ]  numbness in arms or legs,  [ ]  difficulty speaking or slurred speech,  [ ]  temporary loss of vision in one eye,   [ ]  dizziness  HEMATOLOGIC:   [ ]  bleeding problems,  [ ]  problems with blood clotting too easily  MUSCULOSKEL:   [ ]  joint pain, [ ]  joint swelling  GASTROINTEST:   [ ]   vomiting blood,  [ ]  blood in stool     GENITOURINARY:   [ ]  burning with urination,  [ ]  blood in urine  PSYCHIATRIC:   [ ]  history of major depression  INTEGUMENTARY:   [ ]  rashes,  [ ]  ulcers  CONSTITUTIONAL:   [ ]  fever,  [ ]  chills     Physical Examination  Filed Vitals:   10/11/14 1048  BP: 116/40  Pulse: 56  Temp: 98.1 F (36.7 C)  Resp: 16  Height: 5' 9.5" (1.765 m)  Weight: 329 lb (149.233 kg)  SpO2: 100%   Body mass index is 47.9 kg/(m^2).  General: A&O x 3, WD, morbidly obese  Pulmonary: Sym exp, good air movt, CTAB, no rales, rhonchi, & wheezing  Cardiac: RRR, Nl S1, S2, no Murmurs, rubs or gallops  Vascular: Vessel Right Left  Radial Faintly Palpable Palpable  Ulnar Not Palpable Not Palpable  Brachial Palpable Palpable   Gastrointestinal: soft, NTND, no G/R, bo HSM, no masses, no CVAT B  Musculoskeletal: M/S 5/5 throughout , Extremities without  ischemic changes , palpable thrill in access in RUA: difficult to palpate proximally in the arm, + bruit in access  Neurologic: Pain and light touch intact in extremities , Motor exam as listed above   Medical Decision Making  [name] is a 39 y.o. female who presents with chronic kidney disease stage IV-V   I recommend: R arm shuntogram to evaluate the graft.   I discussed with the patient the nature of angiographic procedures, especially the limited patencies of any endovascular intervention.  The patient is aware of that the risks of an angiographic procedure include but are not limited to: bleeding, infection, access site complications, renal failure, embolization, rupture of vessel, dissection, possible need for emergent surgical intervention, possible need for surgical procedures to treat the patient's  pathology, anaphylactic reaction to contrast, and stroke and death.    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  The patient has agreed to proceed with the above procedure which will be scheduled this coming Thursday.Adele Barthel, MD [facility] [place] Office: [phone] Pager: [phone]  10/11/2014, 1:29 PM

## 2014-10-31 NOTE — Discharge Instructions (Signed)
Fistulogram, Care After °Refer to this sheet in the next few weeks. These instructions provide you with information on caring for yourself after your procedure. Your health care provider may also give you more specific instructions. Your treatment has been planned according to current medical practices, but problems sometimes occur. Call your health care provider if you have any problems or questions after your procedure. °WHAT TO EXPECT AFTER THE PROCEDURE °After your procedure, it is typical to have the following: °· A small amount of discomfort in the area where the catheters were placed. °· A small amount of bruising around the fistula. °· Sleepiness and fatigue. °HOME CARE INSTRUCTIONS °· Rest at home for the day following your procedure. °· Do not drive or operate heavy machinery while taking pain medicine. °· Take medicines only as directed by your health care provider. °· Do not take baths, swim, or use a hot tub until your health care provider approves. You may shower 24 hours after the procedure or as directed by your health care provider. °· There are many different ways to close and cover an incision, including stitches, skin glue, and adhesive strips. Follow your health care provider's instructions on: °¨ Incision care. °¨ Bandage (dressing) changes and removal. °¨ Incision closure removal. °· Monitor your dialysis fistula carefully. °SEEK MEDICAL CARE IF: °· You have drainage, redness, swelling, or pain at your catheter site. °· You have a fever. °· You have chills. °SEEK IMMEDIATE MEDICAL CARE IF: °· You feel weak. °· You have trouble balancing. °· You have trouble moving your arms or legs. °· You have problems with your speech or vision. °· You can no longer feel a vibration or buzz when you put your fingers over your dialysis fistula. °· The limb that was used for the procedure: °¨ Swells. °¨ Is painful. °¨ Is cold. °¨ Is discolored, such as blue or pale white. °  °This information is not intended  to replace advice given to you by your health care provider. Make sure you discuss any questions you have with your health care provider. °  °Document Released: 05/07/2013 Document Reviewed: 05/07/2013 °Elsevier Interactive Patient Education ©2016 [employer]vier Inc. ° °

## 2014-10-31 NOTE — Interval H&P Note (Signed)
[facility]   History and Physical Update  The patient was interviewed and re-examined.  The patient's previous History and Physical has been reviewed and is unchanged from my consult.  There is no change in the plan of care: R arm shuntogram.  Adele Barthel, MD [facility] [place] Office: [phone] Pager: [phone]  10/31/2014, 10:09 AM

## 2014-11-01 ENCOUNTER — Telehealth: Payer: Self-pay | Admitting: Vascular Surgery

## 2014-11-01 ENCOUNTER — Ambulatory Visit: Payer: Self-pay | Admitting: Obstetrics & Gynecology

## 2014-11-01 ENCOUNTER — Encounter (HOSPITAL_COMMUNITY): Payer: Self-pay | Admitting: Vascular Surgery

## 2014-11-01 NOTE — Telephone Encounter (Signed)
-----   Message from Mena Goes, RN sent at 10/31/2014 12:36 PM EDT ----- Regarding: schedule   ----- Message -----    From: Conrad Cartago, MD    Sent: 10/31/2014  12:15 PM      To: [facility] [address]  [name] [id number] 1975/11/02  PROCEDURE: 1. right upper arm arteriovenous graft cannulation under ultrasound guidance 2. right arm shuntogram 3. Venoplasty of brachial vein (6 mm x 40 mm)  Follow-up: 3 month  Orders(s) for follow-up: R arm access duplex

## 2014-11-01 NOTE — Telephone Encounter (Signed)
LM for pt re appt, dpm °

## 2014-11-12 ENCOUNTER — Telehealth: Payer: Self-pay | Admitting: General Practice

## 2014-11-12 DIAGNOSIS — N921 Excessive and frequent menstruation with irregular cycle: Secondary | ICD-10-CM

## 2014-11-12 MED ORDER — MEGESTROL ACETATE 20 MG PO TABS: 20.0000 mg | ORAL_TABLET | Freq: Two times a day (BID) | ORAL | Status: DC

## 2014-11-12 NOTE — Telephone Encounter (Signed)
Patient called and left message stating she is almost out of her megestrol and needs a refill sent in to her [facility] on [address]. Patient states she is a patient of Dr Hulan Fray. Spoke with Manya Silvas who authorized 3 refills. Called patient and informed her of refills sent to pharmacy and discussed her returning for an IUD as that is a better long term option. Patient verbalized understanding and states that she has had a lot of surgeries and health issues this year and her doctors have wanted to her to wait. Patient states she will give [place] a call and schedule that appt when okay'ed by her doctors. Patient had no other questions

## 2014-11-21 ENCOUNTER — Inpatient Hospital Stay (HOSPITAL_COMMUNITY): Admission: RE | Admit: 2014-11-21 | Payer: 59 | Source: Ambulatory Visit

## 2014-11-29 ENCOUNTER — Other Ambulatory Visit (HOSPITAL_COMMUNITY): Payer: Self-pay

## 2014-12-02 ENCOUNTER — Encounter (HOSPITAL_COMMUNITY): Payer: 59

## 2014-12-06 ENCOUNTER — Encounter (HOSPITAL_COMMUNITY)
Admission: RE | Admit: 2014-12-06 | Discharge: 2014-12-06 | Disposition: A | Discharge status: Home / Self Care | Payer: 59 | Source: Ambulatory Visit | Attending: Nephrology | Admitting: Nephrology

## 2014-12-06 DIAGNOSIS — N185 Chronic kidney disease, stage 5: Secondary | ICD-10-CM | POA: Diagnosis not present

## 2014-12-06 DIAGNOSIS — Z79899 Other long term (current) drug therapy: Secondary | ICD-10-CM | POA: Diagnosis not present

## 2014-12-06 DIAGNOSIS — Z5181 Encounter for therapeutic drug level monitoring: Secondary | ICD-10-CM | POA: Diagnosis not present

## 2014-12-06 DIAGNOSIS — D631 Anemia in chronic kidney disease: Secondary | ICD-10-CM | POA: Diagnosis not present

## 2014-12-06 LAB — IRON AND TIBC
Iron: 76 ug/dL (ref 28–170)
Saturation Ratios: 26 % (ref 10.4–31.8)
TIBC: 290 ug/dL (ref 250–450)
UIBC: 214 ug/dL

## 2014-12-06 LAB — POCT HEMOGLOBIN-HEMACUE: Hemoglobin: 11.9 g/dL — ABNORMAL LOW (ref 12.0–15.0)

## 2014-12-06 LAB — FERRITIN: Ferritin: 492 ng/mL — ABNORMAL HIGH (ref 11–307)

## 2014-12-06 MED ORDER — DARBEPOETIN ALFA 100 MCG/0.5ML IJ SOSY
PREFILLED_SYRINGE | INTRAMUSCULAR | Status: AC
  Filled 2014-12-06: qty 0.5

## 2014-12-06 MED ORDER — DARBEPOETIN ALFA 100 MCG/0.5ML IJ SOSY
100.0000 ug | PREFILLED_SYRINGE | INTRAMUSCULAR | Status: DC
  Administered 2014-12-06: 100 ug via SUBCUTANEOUS

## 2015-01-03 ENCOUNTER — Encounter (HOSPITAL_COMMUNITY)
Admission: RE | Admit: 2015-01-03 | Discharge: 2015-01-03 | Disposition: A | Discharge status: Home / Self Care | Payer: 59 | Source: Ambulatory Visit | Attending: Nephrology | Admitting: Nephrology

## 2015-01-03 DIAGNOSIS — N185 Chronic kidney disease, stage 5: Secondary | ICD-10-CM | POA: Diagnosis not present

## 2015-01-03 LAB — IRON AND TIBC
IRON: 44 ug/dL (ref 28–170)
Saturation Ratios: 17 % (ref 10.4–31.8)
TIBC: 255 ug/dL (ref 250–450)
UIBC: 211 ug/dL

## 2015-01-03 LAB — FERRITIN: Ferritin: 515 ng/mL — ABNORMAL HIGH (ref 11–307)

## 2015-01-03 MED ORDER — DARBEPOETIN ALFA 100 MCG/0.5ML IJ SOSY
PREFILLED_SYRINGE | INTRAMUSCULAR | Status: AC
  Filled 2015-01-03: qty 0.5

## 2015-01-03 MED ORDER — DARBEPOETIN ALFA 100 MCG/0.5ML IJ SOSY
100.0000 ug | PREFILLED_SYRINGE | INTRAMUSCULAR | Status: DC
  Administered 2015-01-03: 100 ug via SUBCUTANEOUS

## 2015-01-07 LAB — POCT HEMOGLOBIN-HEMACUE: HEMOGLOBIN: 11.4 g/dL — AB (ref 12.0–15.0)

## 2015-01-24 ENCOUNTER — Encounter: Payer: Self-pay | Admitting: Vascular Surgery

## 2015-01-29 ENCOUNTER — Other Ambulatory Visit (HOSPITAL_COMMUNITY): Payer: Self-pay | Admitting: *Deleted

## 2015-01-30 ENCOUNTER — Encounter (HOSPITAL_COMMUNITY)
Admission: RE | Admit: 2015-01-30 | Discharge: 2015-01-30 | Disposition: A | Discharge status: Home / Self Care | Payer: BLUE CROSS/BLUE SHIELD | Source: Ambulatory Visit | Attending: Nephrology | Admitting: Nephrology

## 2015-01-30 DIAGNOSIS — Z5181 Encounter for therapeutic drug level monitoring: Secondary | ICD-10-CM | POA: Diagnosis not present

## 2015-01-30 DIAGNOSIS — D631 Anemia in chronic kidney disease: Secondary | ICD-10-CM | POA: Diagnosis not present

## 2015-01-30 DIAGNOSIS — N185 Chronic kidney disease, stage 5: Secondary | ICD-10-CM | POA: Insufficient documentation

## 2015-01-30 DIAGNOSIS — Z79899 Other long term (current) drug therapy: Secondary | ICD-10-CM | POA: Diagnosis not present

## 2015-01-30 LAB — FERRITIN: Ferritin: 457 ng/mL — ABNORMAL HIGH (ref 11–307)

## 2015-01-30 LAB — IRON AND TIBC
IRON: 52 ug/dL (ref 28–170)
Saturation Ratios: 22 % (ref 10.4–31.8)
TIBC: 239 ug/dL — AB (ref 250–450)
UIBC: 187 ug/dL

## 2015-01-30 LAB — POCT HEMOGLOBIN-HEMACUE: HEMOGLOBIN: 10.4 g/dL — AB (ref 12.0–15.0)

## 2015-01-30 MED ORDER — DARBEPOETIN ALFA 100 MCG/0.5ML IJ SOSY
PREFILLED_SYRINGE | INTRAMUSCULAR | Status: AC
  Filled 2015-01-30: qty 0.5

## 2015-01-30 MED ORDER — DARBEPOETIN ALFA 100 MCG/0.5ML IJ SOSY
100.0000 ug | PREFILLED_SYRINGE | INTRAMUSCULAR | Status: DC
  Administered 2015-01-30: 100 ug via SUBCUTANEOUS

## 2015-01-30 MED ORDER — SODIUM CHLORIDE 0.9 % IV SOLN
510.0000 mg | Freq: Once | INTRAVENOUS | Status: AC
  Administered 2015-01-30: 510 mg via INTRAVENOUS
  Filled 2015-01-30: qty 17

## 2015-01-31 ENCOUNTER — Encounter: Payer: Self-pay | Admitting: Vascular Surgery

## 2015-01-31 ENCOUNTER — Ambulatory Visit (INDEPENDENT_AMBULATORY_CARE_PROVIDER_SITE_OTHER): Payer: BLUE CROSS/BLUE SHIELD | Admitting: Vascular Surgery

## 2015-01-31 ENCOUNTER — Encounter (HOSPITAL_COMMUNITY): Payer: Self-pay

## 2015-01-31 VITALS — BP 144/82 | HR 88 | Temp 97.9°F | Resp 16 | Ht 69.5 in | Wt 314.6 lb

## 2015-01-31 DIAGNOSIS — N185 Chronic kidney disease, stage 5: Secondary | ICD-10-CM

## 2015-01-31 NOTE — Addendum Note (Signed)
Addended by: Dorthula Rue L on: 01/31/2015 04:03 PM   Modules accepted: Orders

## 2015-01-31 NOTE — Progress Notes (Signed)
Established Dialysis Access  History of Present Illness  [name] is a 40 y.o. (1975/12/08) female who presents for re-evaluation for permanent access.  The patient is right hand dominant.  Previous access procedures have been completed in both arms.  The patient's complication from previous access procedures include: thrombosis.  The patient has never had a previous PPM placed.  The patient still is CKD Stage V and has not started HD.  Her RUA AVG reported has a whistle in it dependent on position.  She had venoplasty of the venous anastomosis on 10/31/14.  The patient's PMH, PSH, SH, and FamHx are unchanged from 10/31/14.  Current Outpatient Prescriptions  Medication Sig Dispense Refill  . allopurinol (ZYLOPRIM) 100 MG tablet Take 100 mg by mouth 2 (two) times daily.     . carvedilol (COREG) 25 MG tablet Take 25 mg by mouth 2 (two) times daily with a meal.    . ferrous sulfate 325 (65 FE) MG EC tablet Take 1 tablet (325 mg total) by mouth 2 (two) times daily. 60 tablet 1  . furosemide (LASIX) 80 MG tablet Take 80 mg by mouth 2 (two) times daily.    . hydrALAZINE (APRESOLINE) 100 MG tablet Take 50 mg by mouth 3 (three) times daily.     . megestrol (MEGACE) 20 MG tablet Take 1 tablet (20 mg total) by mouth 2 (two) times daily. 60 tablet 2  . pravastatin (PRAVACHOL) 40 MG tablet Take 40 mg by mouth daily.     Marland Kitchen RENVELA 800 MG tablet Take 1,600 mg by mouth 3 (three) times daily with meals.      No current facility-administered medications for this visit.   Facility-Administered Medications Ordered in Other Visits  Medication Dose Route Frequency Provider Last Rate Last Dose  . 0.9 %  sodium chloride infusion   Intravenous Continuous Conrad [place], MD        No Known Allergies  On ROS today: no steal sx, no HD yet   Physical Examination  Filed Vitals:   01/31/15 1328 01/31/15 1331  BP: 149/88 144/82  Pulse: 88   Temp: 97.9 F (36.6 C)   TempSrc: Oral   Resp: 16    Height: 5' 9.5" (1.765 m)   Weight: 314 lb 9.6 oz (142.702 kg)   SpO2: 95%    Body mass index is 45.81 kg/(m^2).  General: A&O x 3, WD, obese  Pulmonary: Sym exp, good air movt, CTAB, no rales, rhonchi, & wheezing  Cardiac: RRR, Nl S1, S2, no Murmurs, rubs or gallops  Vascular: Vessel Right Left  Radial Not Palpable Palpable  Ulnar Not Palpable Not Palpable  Brachial Palpable Palpable   Gastrointestinal: soft, NTND, no G/R, bo HSM, no masses, no CVAT B  Musculoskeletal: M/S 5/5 throughout , Extremities without  ischemic changes , + palpable thrill in access in RUA AVG, + bruit in access: whistling with the arm rotated again chest, no whistling with arm rotated  Neurologic: Pain and light touch intact in extremities , Motor exam as listed above  Non-Invasive Vascular Imaging  Right arm Access Duplex  (ordered):     Medical Decision Making  [name] is a 40 y.o. female who presents with chronic kidney disease stage V requiring hemodialysis.    Given this patient's obesity, she is at some risk of occlusion due to mass effect on the graft.  Would obtain AVG duplex to determine if recurrence of venous stenosis.  The patient will follow  up in one month for this study.Adele Barthel, MD [facility] [place] Office: [phone] Pager: [phone]  01/31/2015, 3:17 PM

## 2015-02-26 ENCOUNTER — Encounter (HOSPITAL_COMMUNITY)
Admission: RE | Admit: 2015-02-26 | Discharge: 2015-02-26 | Disposition: A | Discharge status: Home / Self Care | Payer: BLUE CROSS/BLUE SHIELD | Source: Ambulatory Visit | Attending: Nephrology | Admitting: Nephrology

## 2015-02-26 DIAGNOSIS — Z5181 Encounter for therapeutic drug level monitoring: Secondary | ICD-10-CM | POA: Insufficient documentation

## 2015-02-26 DIAGNOSIS — N185 Chronic kidney disease, stage 5: Secondary | ICD-10-CM | POA: Insufficient documentation

## 2015-02-26 DIAGNOSIS — D631 Anemia in chronic kidney disease: Secondary | ICD-10-CM | POA: Diagnosis not present

## 2015-02-26 DIAGNOSIS — Z79899 Other long term (current) drug therapy: Secondary | ICD-10-CM | POA: Diagnosis not present

## 2015-02-26 LAB — IRON AND TIBC
IRON: 59 ug/dL (ref 28–170)
Saturation Ratios: 23 % (ref 10.4–31.8)
TIBC: 252 ug/dL (ref 250–450)
UIBC: 193 ug/dL

## 2015-02-26 LAB — FERRITIN: FERRITIN: 598 ng/mL — AB (ref 11–307)

## 2015-02-26 LAB — POCT HEMOGLOBIN-HEMACUE: HEMOGLOBIN: 11.2 g/dL — AB (ref 12.0–15.0)

## 2015-02-26 MED ORDER — DARBEPOETIN ALFA 100 MCG/0.5ML IJ SOSY
PREFILLED_SYRINGE | INTRAMUSCULAR | Status: AC
  Filled 2015-02-26: qty 0.5

## 2015-02-26 MED ORDER — DARBEPOETIN ALFA 100 MCG/0.5ML IJ SOSY
100.0000 ug | PREFILLED_SYRINGE | INTRAMUSCULAR | Status: DC
  Administered 2015-02-26: 100 ug via SUBCUTANEOUS

## 2015-02-27 ENCOUNTER — Ambulatory Visit (HOSPITAL_COMMUNITY)
Admission: RE | Admit: 2015-02-27 | Discharge: 2015-02-27 | Disposition: A | Discharge status: Home / Self Care | Payer: BLUE CROSS/BLUE SHIELD | Source: Ambulatory Visit | Attending: Vascular Surgery | Admitting: Vascular Surgery

## 2015-02-27 ENCOUNTER — Encounter (HOSPITAL_COMMUNITY): Payer: Self-pay

## 2015-02-27 ENCOUNTER — Encounter: Payer: Self-pay | Admitting: Vascular Surgery

## 2015-02-27 DIAGNOSIS — I509 Heart failure, unspecified: Secondary | ICD-10-CM | POA: Diagnosis not present

## 2015-02-27 DIAGNOSIS — E785 Hyperlipidemia, unspecified: Secondary | ICD-10-CM | POA: Insufficient documentation

## 2015-02-27 DIAGNOSIS — N185 Chronic kidney disease, stage 5: Secondary | ICD-10-CM | POA: Diagnosis not present

## 2015-02-27 DIAGNOSIS — I132 Hypertensive heart and chronic kidney disease with heart failure and with stage 5 chronic kidney disease, or end stage renal disease: Secondary | ICD-10-CM | POA: Diagnosis not present

## 2015-03-06 ENCOUNTER — Encounter: Payer: Self-pay | Admitting: Vascular Surgery

## 2015-03-06 NOTE — Progress Notes (Signed)
Established Dialysis Access   History of Present Illness  [name] is a 40 y.o. ([birth year]) female  who presents for R access duplex.  This patient remains off HD.  She denies any steal sx.  She has not used the R UA AVG to date.  Past Medical History  Diagnosis Date  . Hypertension   . Thyroid disease   . Morbid obesity ([place])   . Hyperlipidemia   . History of pneumonia   . Foot fracture   . Dilated cardiomyopathy ([place])   . CHF (congestive heart failure) ([place])     hosp. 2011  . Gout   . Anxiety     pt not aware of this  . Arthritis   . Anemia   . Menorrhagia   . Chronic kidney disease     not on dialysis at this time  . Encounter for blood transfusion   . History of blood transfusion   . Shortness of breath     seldom  . Pneumonia     hosp. 2010  . Heart murmur     as a child    Past Surgical History  Procedure Laterality Date  . Cardiac surgery  1979    repair of hole in heart  . Av fistula placement  08/04/2011    Procedure: ARTERIOVENOUS (AV) FISTULA CREATION;  Surgeon: Elam Dutch, MD;  Location: [facility] OR;  Service: Vascular;  Laterality: Right;  Creation right brachiocephalic arteriovenous fistula  . Av fistula placement Left 02/05/2013    Procedure: ARTERIOVENOUS FISTULA CREATION LEFT ARM;  Surgeon: Conrad Lake [place], MD;  Location: [place];  Service: Vascular;  Laterality: Left;  . Thrombectomy w/ embolectomy Left 03/05/2013    Procedure: THROMBECTOMY OF LEFT BRACHIAL ARTERY;  Surgeon: Conrad In[place]brook, MD;  Location: [place];  Service: Vascular;  Laterality: Left;  . Ligation of arteriovenous  fistula Left 03/05/2013    Procedure: LIGATION OF ARTERIOVENOUS  FISTULA;  Surgeon: Conrad [place], MD;  Location: [place];  Service: Vascular;  Laterality: Left;  . Patch angioplasty Left 03/05/2013    Procedure: PATCH ANGIOPLASTY;  Surgeon: Conrad [place] Al[place], MD;  Location: [place];  Service: Vascular;  Laterality: Left;  . Av fistula placement Right 06/04/2013    Procedure:  ARTERIOVENOUS (AV) FISTULA CREATION;  Surgeon: Conrad Smackover, MD;  Location: [place];  Service: Vascular;  Laterality: Right;  . Bascilic vein transposition Right 08/13/2013    Procedure: Second Stage Brachial Vein Transposition;  Surgeon: Conrad Billings, MD;  Location: [place];  Service: Vascular;  Laterality: Right;  . Venogram Bilateral 03/25/2014    Procedure: VENOGRAM bilateral;  Surgeon: Conrad [place], MD;  Location: [facility] CATH LAB;  Service: Cardiovascular;  Laterality: Bilateral;  . Av fistula placement Right 04/15/2014    Procedure: INSERTION OF ARTERIOVENOUS (AV) GORE-TEX GRAFT ARM;  Surgeon: Conrad [place], MD;  Location: [place];  Service: Vascular;  Laterality: Right;  . Peripheral vascular catheterization Right 10/31/2014    Procedure: Fistulagram;  Surgeon: Conrad Airmont, MD;  Location: [place] CV LAB;  Service: Cardiovascular;  Laterality: Right;  . Peripheral vascular catheterization Right 10/31/2014    Procedure: Peripheral Vascular Balloon Angioplasty;  Surgeon: Conrad Sharon, MD;  Location: [place] CV LAB;  Service: Cardiovascular;  Laterality: Right;  pta venous rt arm    Social History   Social History  . Marital Status: Married    Spouse Name: N/A  . Number of Children:  N/A  . Years of Education: N/A   Occupational History  . Not on file.   Social History Main Topics  . Smoking status: Never Smoker   . Smokeless tobacco: Never Used  . Alcohol Use: No  . Drug Use: No  . Sexual Activity: Yes    Birth Control/ Protection: None     Comment: no birth control    Other Topics Concern  . Not on file   Social History Narrative    Family History  Problem Relation Age of Onset  . Diabetes Mother   . Hyperlipidemia Mother   . Hypertension Mother   . Kidney disease Mother   . Anesthesia problems Neg Hx   . Hypotension Neg Hx   . Malignant hyperthermia Neg Hx   . Pseudochol deficiency Neg Hx     Current Outpatient Prescriptions  Medication Sig Dispense Refill  .  allopurinol (ZYLOPRIM) 100 MG tablet Take 100 mg by mouth 2 (two) times daily.     . carvedilol (COREG) 25 MG tablet Take 25 mg by mouth 2 (two) times daily with a meal.    . ferrous sulfate 325 (65 FE) MG EC tablet Take 1 tablet (325 mg total) by mouth 2 (two) times daily. 60 tablet 1  . furosemide (LASIX) 80 MG tablet Take 80 mg by mouth 2 (two) times daily.    . hydrALAZINE (APRESOLINE) 100 MG tablet Take 50 mg by mouth 3 (three) times daily.     . megestrol (MEGACE) 20 MG tablet Take 1 tablet (20 mg total) by mouth 2 (two) times daily. 60 tablet 2  . pravastatin (PRAVACHOL) 40 MG tablet Take 40 mg by mouth daily.     Marland Kitchen RENVELA 800 MG tablet Take 1,600 mg by mouth 3 (three) times daily with meals.      No current facility-administered medications for this visit.   Facility-Administered Medications Ordered in Other Visits  Medication Dose Route Frequency Provider Last Rate Last Dose  . 0.9 %  sodium chloride infusion   Intravenous Continuous Conrad [place], MD         No Known Allergies   REVIEW OF SYSTEMS:  (Positives checked otherwise negative)  CARDIOVASCULAR:   [ ]  chest pain,  [ ]  chest pressure,  [ ]  palpitations,  [ ]  shortness of breath when laying flat,  [ ]  shortness of breath with exertion,   [ ]  pain in feet when walking,  [ ]  pain in feet when laying flat, [ ]  history of blood clot in veins (DVT),  [ ]  history of phlebitis,  [ ]  swelling in legs,  [ ]  varicose veins  PULMONARY:   [ ]  productive cough,  [ ]  asthma,  [ ]  wheezing  NEUROLOGIC:   [ ]  weakness in arms or legs,  [ ]  numbness in arms or legs,  [ ]  difficulty speaking or slurred speech,  [ ]  temporary loss of vision in one eye,  [ ]  dizziness  HEMATOLOGIC:   [ ]  bleeding problems,  [ ]  problems with blood clotting too easily  MUSCULOSKEL:   [ ]  joint pain, [ ]  joint swelling  GASTROINTEST:   [ ]  vomiting blood,  [ ]  blood in stool     GENITOURINARY:   [ ]  burning with urination,  [ ]   blood in urine  PSYCHIATRIC:   [ ]  history of major depression  INTEGUMENTARY:   [ ]  rashes,  [ ]  ulcers  CONSTITUTIONAL:   [ ]   fever,  [ ]  chills    Physical Examination  Filed Vitals:   03/07/15 1048 03/07/15 1049  BP: 159/95 158/95  Pulse: 55 65  Temp: 97 F (36.1 C)   Resp: 18   Height: 5' 9.5" (1.765 m)   Weight: 311 lb (141.069 kg)   SpO2: 99%    Body mass index is 45.28 kg/(m^2).  General: A&O x 3, WD, obese  Pulmonary: Sym exp, good air movt, CTAB, no rales, rhonchi, & wheezing  Cardiac: RRR, Nl S1, S2, no Murmurs, rubs or gallops  Vascular: Vessel Right Left  Radial Not Palpable Palpable  Ulnar Not Palpable Not Palpable  Brachial Palpable Palpable   Gastrointestinal: soft, NTND, no G/R, bo HSM, no masses, no CVAT B  Musculoskeletal: M/S 5/5 throughout , Extremities without ischemic changes , + palpable thrill in access in RUA AVG, + bruit in access: resolved whistling   Neurologic: Pain and light touch intact in extremities , Motor exam as listed above  Non-Invasive Vascular Imaging  Right Arm Access Duplex (02/27/15):   Inflow anastomosis: 508 c/s  Arterial segment: 779 c/s  Outflow anastomosis: 657 c/s  Evidence of neointimal hyperplasia on B-mode   Medical Decision Making  [name] is a 40 y.o. (1975/01/05) female  who presents with chronic kidney disease stage V, recurrent neointimal hyperplasia   I recommend: R arm shuntogram, intervention with DCB PTA  I discussed with the patient the nature of angiographic procedures, especially the limited patencies of any endovascular intervention.    The patient is aware of that the risks of an angiographic procedure include but are not limited to: bleeding, infection, access site complications, renal failure, embolization, rupture of vessel, dissection, arteriovenous fistula, possible need for emergent surgical intervention, possible need for surgical procedures to treat  the patient's pathology, anaphylactic reaction to contrast, and stroke and death.    The patient is aware of the risks and agrees to proceed.   Adele Barthel, MD, FACS [facility] [place] Office: [phone] Pager: [phone]  03/06/2015, 11:03 PM

## 2015-03-07 ENCOUNTER — Encounter (HOSPITAL_COMMUNITY): Payer: BLUE CROSS/BLUE SHIELD

## 2015-03-07 ENCOUNTER — Ambulatory Visit: Payer: BLUE CROSS/BLUE SHIELD | Admitting: Vascular Surgery

## 2015-03-07 ENCOUNTER — Ambulatory Visit (INDEPENDENT_AMBULATORY_CARE_PROVIDER_SITE_OTHER): Payer: BLUE CROSS/BLUE SHIELD | Admitting: Vascular Surgery

## 2015-03-07 ENCOUNTER — Encounter: Payer: Self-pay | Admitting: Vascular Surgery

## 2015-03-07 ENCOUNTER — Other Ambulatory Visit: Payer: Self-pay

## 2015-03-07 DIAGNOSIS — N185 Chronic kidney disease, stage 5: Secondary | ICD-10-CM | POA: Diagnosis not present

## 2015-03-07 NOTE — Progress Notes (Signed)
Filed Vitals:   03/07/15 1048 03/07/15 1049  BP: 159/95 158/95  Pulse: 55 65  Temp: 97 F (36.1 C)   Resp: 18   Height: 5' 9.5" (1.765 m)   Weight: 311 lb (141.069 kg)   SpO2: 99%

## 2015-03-13 ENCOUNTER — Ambulatory Visit (HOSPITAL_COMMUNITY)
Admission: RE | Admit: 2015-03-13 | Discharge: 2015-03-13 | Disposition: A | Discharge status: Home / Self Care | Payer: BLUE CROSS/BLUE SHIELD | Source: Ambulatory Visit | Attending: Vascular Surgery | Admitting: Vascular Surgery

## 2015-03-13 ENCOUNTER — Encounter (HOSPITAL_COMMUNITY): Payer: Self-pay | Admitting: Vascular Surgery

## 2015-03-13 ENCOUNTER — Encounter (HOSPITAL_COMMUNITY)
Admission: RE | Disposition: A | Discharge status: Home / Self Care | Payer: Self-pay | Source: Ambulatory Visit | Attending: Vascular Surgery

## 2015-03-13 DIAGNOSIS — Z8249 Family history of ischemic heart disease and other diseases of the circulatory system: Secondary | ICD-10-CM | POA: Insufficient documentation

## 2015-03-13 DIAGNOSIS — D649 Anemia, unspecified: Secondary | ICD-10-CM | POA: Diagnosis not present

## 2015-03-13 DIAGNOSIS — E079 Disorder of thyroid, unspecified: Secondary | ICD-10-CM | POA: Diagnosis not present

## 2015-03-13 DIAGNOSIS — Y838 Other surgical procedures as the cause of abnormal reaction of the patient, or of later complication, without mention of misadventure at the time of the procedure: Secondary | ICD-10-CM | POA: Insufficient documentation

## 2015-03-13 DIAGNOSIS — I132 Hypertensive heart and chronic kidney disease with heart failure and with stage 5 chronic kidney disease, or end stage renal disease: Secondary | ICD-10-CM | POA: Diagnosis not present

## 2015-03-13 DIAGNOSIS — F419 Anxiety disorder, unspecified: Secondary | ICD-10-CM | POA: Insufficient documentation

## 2015-03-13 DIAGNOSIS — M1990 Unspecified osteoarthritis, unspecified site: Secondary | ICD-10-CM | POA: Diagnosis not present

## 2015-03-13 DIAGNOSIS — E785 Hyperlipidemia, unspecified: Secondary | ICD-10-CM | POA: Insufficient documentation

## 2015-03-13 DIAGNOSIS — N185 Chronic kidney disease, stage 5: Secondary | ICD-10-CM | POA: Diagnosis not present

## 2015-03-13 DIAGNOSIS — T82898A Other specified complication of vascular prosthetic devices, implants and grafts, initial encounter: Secondary | ICD-10-CM | POA: Diagnosis not present

## 2015-03-13 DIAGNOSIS — I97638 Postprocedural hematoma of a circulatory system organ or structure following other circulatory system procedure: Secondary | ICD-10-CM | POA: Diagnosis not present

## 2015-03-13 DIAGNOSIS — Z6841 Body Mass Index (BMI) 40.0 and over, adult: Secondary | ICD-10-CM | POA: Diagnosis not present

## 2015-03-13 DIAGNOSIS — I509 Heart failure, unspecified: Secondary | ICD-10-CM | POA: Insufficient documentation

## 2015-03-13 DIAGNOSIS — I420 Dilated cardiomyopathy: Secondary | ICD-10-CM | POA: Insufficient documentation

## 2015-03-13 DIAGNOSIS — Y832 Surgical operation with anastomosis, bypass or graft as the cause of abnormal reaction of the patient, or of later complication, without mention of misadventure at the time of the procedure: Secondary | ICD-10-CM | POA: Diagnosis not present

## 2015-03-13 DIAGNOSIS — M109 Gout, unspecified: Secondary | ICD-10-CM | POA: Diagnosis not present

## 2015-03-13 DIAGNOSIS — T82858A Stenosis of vascular prosthetic devices, implants and grafts, initial encounter: Secondary | ICD-10-CM | POA: Insufficient documentation

## 2015-03-13 HISTORY — PX: PERIPHERAL VASCULAR CATHETERIZATION: SHX172C

## 2015-03-13 LAB — POCT I-STAT, CHEM 8
BUN: 60 mg/dL — AB (ref 6–20)
CHLORIDE: 108 mmol/L (ref 101–111)
CREATININE: 6.9 mg/dL — AB (ref 0.44–1.00)
Calcium, Ion: 1.19 mmol/L (ref 1.12–1.23)
GLUCOSE: 87 mg/dL (ref 65–99)
HCT: 36 % (ref 36.0–46.0)
Hemoglobin: 12.2 g/dL (ref 12.0–15.0)
POTASSIUM: 3.9 mmol/L (ref 3.5–5.1)
Sodium: 144 mmol/L (ref 135–145)
TCO2: 24 mmol/L (ref 0–100)

## 2015-03-13 LAB — I-STAT BETA HCG BLOOD, ED (NOT ORDERABLE)

## 2015-03-13 SURGERY — A/V SHUNTOGRAM/FISTULAGRAM
Anesthesia: LOCAL | Laterality: Right

## 2015-03-13 MED ORDER — IODIXANOL 320 MG/ML IV SOLN
INTRAVENOUS | Status: DC | PRN
  Administered 2015-03-13: 10 mL via INTRAVENOUS

## 2015-03-13 MED ORDER — HEPARIN SODIUM (PORCINE) 1000 UNIT/ML IJ SOLN
INTRAMUSCULAR | Status: DC | PRN
  Administered 2015-03-13: 3000 [IU] via INTRAVENOUS

## 2015-03-13 MED ORDER — HEPARIN (PORCINE) IN NACL 2-0.9 UNIT/ML-% IJ SOLN
INTRAMUSCULAR | Status: AC
  Filled 2015-03-13: qty 500

## 2015-03-13 MED ORDER — SODIUM CHLORIDE 0.9% FLUSH: 3.0000 mL | Freq: Two times a day (BID) | INTRAVENOUS | Status: DC

## 2015-03-13 MED ORDER — LIDOCAINE HCL (PF) 1 % IJ SOLN
INTRAMUSCULAR | Status: DC | PRN
  Administered 2015-03-13: 5 mL

## 2015-03-13 MED ORDER — SODIUM CHLORIDE 0.9% FLUSH: 3.0000 mL | INTRAVENOUS | Status: DC | PRN

## 2015-03-13 MED ORDER — ACETAMINOPHEN 325 MG PO TABS: 650.0000 mg | ORAL_TABLET | ORAL | Status: DC | PRN

## 2015-03-13 MED ORDER — LIDOCAINE HCL (PF) 1 % IJ SOLN
INTRAMUSCULAR | Status: AC
  Filled 2015-03-13: qty 30

## 2015-03-13 MED ORDER — HEPARIN SODIUM (PORCINE) 1000 UNIT/ML IJ SOLN
INTRAMUSCULAR | Status: AC
  Filled 2015-03-13: qty 1

## 2015-03-13 MED ORDER — SODIUM CHLORIDE 0.9 % IV SOLN
INTRAVENOUS | Status: DC
  Administered 2015-03-13: 10:00:00 via INTRAVENOUS

## 2015-03-13 MED ORDER — HEPARIN (PORCINE) IN NACL 2-0.9 UNIT/ML-% IJ SOLN
INTRAMUSCULAR | Status: DC | PRN
  Administered 2015-03-13: 500 mL

## 2015-03-13 MED ORDER — SODIUM CHLORIDE 0.9 % IV SOLN: 250.0000 mL | INTRAVENOUS | Status: DC | PRN

## 2015-03-13 SURGICAL SUPPLY — 15 items
BAG SNAP BAND KOVER 36X36 (MISCELLANEOUS) ×3 IMPLANT
BALLN ARMADA 6X80X80 (BALLOONS) ×3
BALLN LUTONIX DCB 6X60X130 (BALLOONS) ×3
BALLOON ARMADA 6X80X80 (BALLOONS) IMPLANT
BALLOON LUTONIX DCB 6X60X130 (BALLOONS) IMPLANT
COVER DOME SNAP 22 D (MISCELLANEOUS) ×3 IMPLANT
COVER PRB 48X5XTLSCP FOLD TPE (BAG) ×2 IMPLANT
COVER PROBE 5X48 (BAG) ×3
KIT ENCORE 26 ADVANTAGE (KITS) ×1 IMPLANT
KIT MICROINTRODUCER STIFF 5F (SHEATH) ×3 IMPLANT
SHEATH PINNACLE R/O II 6F 4CM (SHEATH) ×1 IMPLANT
STOPCOCK MORSE 400PSI 3WAY (MISCELLANEOUS) ×3 IMPLANT
TRAY PV CATH (CUSTOM PROCEDURE TRAY) ×3 IMPLANT
TUBING CIL FLEX 10 FLL-RA (TUBING) ×3 IMPLANT
WIRE ROSEN-J .035X260CM (WIRE) ×1 IMPLANT

## 2015-03-13 NOTE — H&P (View-Only) (Signed)
Established Dialysis Access   History of Present Illness  [name] is a 40 y.o. (04/30/1975) female  who presents for R access duplex.  This patient remains off HD.  She denies any steal sx.  She has not used the R UA AVG to date.  Past Medical History  Diagnosis Date  . Hypertension   . Thyroid disease   . Morbid obesity ([place])   . Hyperlipidemia   . History of pneumonia   . Foot fracture   . Dilated cardiomyopathy ([place])   . CHF (congestive heart failure) ([place])     hosp. 2011  . Gout   . Anxiety     pt not aware of this  . Arthritis   . Anemia   . Menorrhagia   . Chronic kidney disease     not on dialysis at this time  . Encounter for blood transfusion   . History of blood transfusion   . Shortness of breath     seldom  . Pneumonia     hosp. 2010  . Heart murmur     as a child    Past Surgical History  Procedure Laterality Date  . Cardiac surgery  1979    repair of hole in heart  . Av fistula placement  08/04/2011    Procedure: ARTERIOVENOUS (AV) FISTULA CREATION;  Surgeon: Elam Dutch, MD;  Location: [facility] OR;  Service: Vascular;  Laterality: Right;  Creation right brachiocephalic arteriovenous fistula  . Av fistula placement Left 02/05/2013    Procedure: ARTERIOVENOUS FISTULA CREATION LEFT ARM;  Surgeon: Conrad Phillips, MD;  Location: [place];  Service: Vascular;  Laterality: Left;  . Thrombectomy w/ embolectomy Left 03/05/2013    Procedure: THROMBECTOMY OF LEFT BRACHIAL ARTERY;  Surgeon: Conrad Kit Carson, MD;  Location: [place];  Service: Vascular;  Laterality: Left;  . Ligation of arteriovenous  fistula Left 03/05/2013    Procedure: LIGATION OF ARTERIOVENOUS  FISTULA;  Surgeon: Conrad Loma, MD;  Location: [place];  Service: Vascular;  Laterality: Left;  . Patch angioplasty Left 03/05/2013    Procedure: PATCH ANGIOPLASTY;  Surgeon: Conrad Manteca, MD;  Location: [place];  Service: Vascular;  Laterality: Left;  . Av fistula placement Right 06/04/2013    Procedure:  ARTERIOVENOUS (AV) FISTULA CREATION;  Surgeon: Conrad Shields, MD;  Location: [facility];  Service: Vascular;  Laterality: Right;  . Bascilic vein transposition Right 08/13/2013    Procedure: Second Stage Brachial Vein Transposition;  Surgeon: Conrad Los Olivos, MD;  Location: [place];  Service: Vascular;  Laterality: Right;  . Venogram Bilateral 03/25/2014    Procedure: VENOGRAM bilateral;  Surgeon: Conrad Haviland, MD;  Location: [facility] CATH LAB;  Service: Cardiovascular;  Laterality: Bilateral;  . Av fistula placement Right 04/15/2014    Procedure: INSERTION OF ARTERIOVENOUS (AV) GORE-TEX GRAFT ARM;  Surgeon: Conrad Jackpot, MD;  Location: [place];  Service: Vascular;  Laterality: Right;  . Peripheral vascular catheterization Right 10/31/2014    Procedure: Fistulagram;  Surgeon: Conrad [place] Republic, MD;  Location: [place] [facility];  Service: Cardiovascular;  Laterality: Right;  . Peripheral vascular catheterization Right 10/31/2014    Procedure: Peripheral Vascular Balloon Angioplasty;  Surgeon: Conrad Rensselaer, MD;  Location: [place] [facility] LAB;  Service: Cardiovascular;  Laterality: Right;  pta venous rt arm    Social History   Social History  . Marital Status: Married    Spouse Name: N/A  . Number of Children:  N/A  . Years of Education: N/A   Occupational History  . Not on file.   Social History Main Topics  . Smoking status: Never Smoker   . Smokeless tobacco: Never Used  . Alcohol Use: No  . Drug Use: No  . Sexual Activity: Yes    Birth Control/ Protection: None     Comment: no birth control    Other Topics Concern  . Not on file   Social History Narrative    Family History  Problem Relation Age of Onset  . Diabetes Mother   . Hyperlipidemia Mother   . Hypertension Mother   . Kidney disease Mother   . Anesthesia problems Neg Hx   . Hypotension Neg Hx   . Malignant hyperthermia Neg Hx   . Pseudochol deficiency Neg Hx     Current Outpatient Prescriptions  Medication Sig Dispense Refill  .  allopurinol (ZYLOPRIM) 100 MG tablet Take 100 mg by mouth 2 (two) times daily.     . carvedilol (COREG) 25 MG tablet Take 25 mg by mouth 2 (two) times daily with a meal.    . ferrous sulfate 325 (65 FE) MG EC tablet Take 1 tablet (325 mg total) by mouth 2 (two) times daily. 60 tablet 1  . furosemide (LASIX) 80 MG tablet Take 80 mg by mouth 2 (two) times daily.    . hydrALAZINE (APRESOLINE) 100 MG tablet Take 50 mg by mouth 3 (three) times daily.     . megestrol (MEGACE) 20 MG tablet Take 1 tablet (20 mg total) by mouth 2 (two) times daily. 60 tablet 2  . pravastatin (PRAVACHOL) 40 MG tablet Take 40 mg by mouth daily.     Marland Kitchen RENVELA 800 MG tablet Take 1,600 mg by mouth 3 (three) times daily with meals.      No current facility-administered medications for this visit.   Facility-Administered Medications Ordered in Other Visits  Medication Dose Route Frequency Provider Last Rate Last Dose  . 0.9 %  sodium chloride infusion   Intravenous Continuous Conrad Kaser, MD         No Known Allergies   REVIEW OF SYSTEMS:  (Positives checked otherwise negative)  CARDIOVASCULAR:   [ ]  chest pain,  [ ]  chest pressure,  [ ]  palpitations,  [ ]  shortness of breath when laying flat,  [ ]  shortness of breath with exertion,   [ ]  pain in feet when walking,  [ ]  pain in feet when laying flat, [ ]  history of blood clot in veins (DVT),  [ ]  history of phlebitis,  [ ]  swelling in legs,  [ ]  varicose veins  PULMONARY:   [ ]  productive cough,  [ ]  asthma,  [ ]  wheezing  NEUROLOGIC:   [ ]  weakness in arms or legs,  [ ]  numbness in arms or legs,  [ ]  difficulty speaking or slurred speech,  [ ]  temporary loss of vision in one eye,  [ ]  dizziness  HEMATOLOGIC:   [ ]  bleeding problems,  [ ]  problems with blood clotting too easily  MUSCULOSKEL:   [ ]  joint pain, [ ]  joint swelling  GASTROINTEST:   [ ]  vomiting blood,  [ ]  blood in stool     GENITOURINARY:   [ ]  burning with urination,  [ ]   blood in urine  PSYCHIATRIC:   [ ]  history of major depression  INTEGUMENTARY:   [ ]  rashes,  [ ]  ulcers  CONSTITUTIONAL:   [ ]   fever,  [ ]  chills    Physical Examination  Filed Vitals:   03/07/15 1048 03/07/15 1049  BP: 159/95 158/95  Pulse: 55 65  Temp: 97 F (36.1 C)   Resp: 18   Height: 5' 9.5" (1.765 m)   Weight: 311 lb (141.069 kg)   SpO2: 99%    Body mass index is 45.28 kg/(m^2).  General: A&O x 3, WD, obese  Pulmonary: Sym exp, good air movt, CTAB, no rales, rhonchi, & wheezing  Cardiac: RRR, Nl S1, S2, no Murmurs, rubs or gallops  Vascular: Vessel Right Left  Radial Not Palpable Palpable  Ulnar Not Palpable Not Palpable  Brachial Palpable Palpable   Gastrointestinal: soft, NTND, no G/R, bo HSM, no masses, no CVAT B  Musculoskeletal: M/S 5/5 throughout , Extremities without ischemic changes , + palpable thrill in access in RUA AVG, + bruit in access: resolved whistling   Neurologic: Pain and light touch intact in extremities , Motor exam as listed above  Non-Invasive Vascular Imaging  Right Arm Access Duplex (02/27/15):   Inflow anastomosis: 508 c/s  Arterial segment: 779 c/s  Outflow anastomosis: 657 c/s  Evidence of neointimal hyperplasia on B-mode   Medical Decision Making  MARGUERITA STEED is a 40 y.o. (14-Jan-1975) female  who presents with chronic kidney disease stage V, recurrent neointimal hyperplasia   I recommend: R arm shuntogram, intervention with DCB PTA  I discussed with the patient the nature of angiographic procedures, especially the limited patencies of any endovascular intervention.    The patient is aware of that the risks of an angiographic procedure include but are not limited to: bleeding, infection, access site complications, renal failure, embolization, rupture of vessel, dissection, arteriovenous fistula, possible need for emergent surgical intervention, possible need for surgical procedures to treat  the patient's pathology, anaphylactic reaction to contrast, and stroke and death.    The patient is aware of the risks and agrees to proceed.   Adele Barthel, MD, FACS [facility] Office: [phone] Pager: [phone]  03/06/2015, 11:03 PM

## 2015-03-13 NOTE — Op Note (Addendum)
OPERATIVE NOTE   PROCEDURE: 1.  right upper arm arteriovenous graft cannulation under ultrasound guidance 2.  right arm shuntogram 3.  Venoplasty of arteriovenous graft and brachial vein (6 mm x 80 mm) 4.  Drug coated venoplasty of brachial vein (6 mm x 60 mm)  PRE-OPERATIVE DIAGNOSIS: Malfunctioning right upper arm arteriovenous graft  POST-OPERATIVE DIAGNOSIS: same as above   SURGEON: Adele Barthel, MD  ANESTHESIA: local  ESTIMATED BLOOD LOSS: 5 cc  FINDING(S): 1. Patent arteriovenous graft with minimal intra-graft neointimal hyperplasia as evident by minimal resolving waist with inflation 2. Venous anastomotic stenosis >75%: resolved after serial venoplasty 3. Widely patent arterial anastomosis  SPECIMEN(S):  None  CONTRAST: 20 cc  INDICATIONS: [name] is a 40 y.o. female who presents with malfunctioning right upper arm arteriovenous graft.  The patient is scheduled for right upper arm shuntogram, possible intervention.  The patient is aware the risks include but are not limited to: bleeding, infection, thrombosis of the cannulated access, and possible anaphylactic reaction to the contrast.  The patient is aware of the risks of the procedure and elects to proceed forward.  DESCRIPTION: After full informed written consent was obtained, the patient was brought back to the angiography suite and placed supine upon the angiography table.  The patient was connected to monitoring equipment.  The right upper arm was prepped and draped in the standard fashion for a percutaneous access intervention.  Under ultrasound guidance, the right upper arm arteriovenous graft was cannulated with a micropuncture needle.  The microwire was advanced into the fistula and the needle was exchanged for the a microsheath, which was lodged 2 cm into the access.  The wire was removed and the sheath was connected to the IV extension tubing.  Hand injections were completed to image the access from  the antecubitum up to the level of heart.  Based on the images, this patient will need: venoplasty of the graft and venous anastomosis.  A Rosen wire was advanced into the right subclavian vein and the sheath was exchanged for a short 6-Fr sheath.  3000 units of Heparin were given intravenously to obtain some anticoagulation.  Based on the imaging, a 6 mm x 80 mm angioplasty balloon was selected.  The balloon was inflated throughout the graft at 6 ATM for 1 minute.  I shot a reflux injection at the first inflation.  This demonstrated a patent anastomosis.  The balloon was centered on the venous anastomosis and inflated to 12 ATM for 2 minutes.   At this point, the balloon was exchanged for a Lutonix 6 mm x 60 mm angioplasty balloon.  The balloon was centered around the stenosis and inflated to 12 ATM for 2 minutes.  On completion imaging, the stenosis was resolved.  Based on the completion imaging, no further intervention is necessary.  The wire and balloon were removed from the sheath.  A 4-0 Monocryl purse-string suture was sewn around the sheath.  The sheath was removed while tying down the suture.  A sterile bandage was applied to the puncture site.   COMPLICATIONS: small access site hematoma  CONDITION: stable   Adele Barthel, MD [facility] Office: [phone] Pager: [phone]  03/13/2015 10:29 AM

## 2015-03-13 NOTE — Discharge Instructions (Signed)
Fistulogram, Care After °Refer to this sheet in the next few weeks. These instructions provide you with information on caring for yourself after your procedure. Your health care provider may also give you more specific instructions. Your treatment has been planned according to current medical practices, but problems sometimes occur. Call your health care provider if you have any problems or questions after your procedure. °WHAT TO EXPECT AFTER THE PROCEDURE °After your procedure, it is typical to have the following: °· A small amount of discomfort in the area where the catheters were placed. °· A small amount of bruising around the fistula. °· Sleepiness and fatigue. °HOME CARE INSTRUCTIONS °· Rest at home for the day following your procedure. °· Do not drive or operate heavy machinery while taking pain medicine. °· Take medicines only as directed by your health care provider. °· Do not take baths, swim, or use a hot tub until your health care provider approves. You may shower 24 hours after the procedure or as directed by your health care provider. °· There are many different ways to close and cover an incision, including stitches, skin glue, and adhesive strips. Follow your health care provider's instructions on: °¨ Incision care. °¨ Bandage (dressing) changes and removal. °¨ Incision closure removal. °· Monitor your dialysis fistula carefully. °SEEK MEDICAL CARE IF: °· You have drainage, redness, swelling, or pain at your catheter site. °· You have a fever. °· You have chills. °SEEK IMMEDIATE MEDICAL CARE IF: °· You feel weak. °· You have trouble balancing. °· You have trouble moving your arms or legs. °· You have problems with your speech or vision. °· You can no longer feel a vibration or buzz when you put your fingers over your dialysis fistula. °· The limb that was used for the procedure: °¨ Swells. °¨ Is painful. °¨ Is cold. °¨ Is discolored, such as blue or pale white. °  °This information is not intended  to replace advice given to you by your health care provider. Make sure you discuss any questions you have with your health care provider. °  °Document Released: 05/07/2013 Document Reviewed: 05/07/2013 °Elsevier Interactive Patient Education ©2016 [employer]vier Inc. ° °

## 2015-03-13 NOTE — Interval H&P Note (Signed)
[facility]  History and Physical Update  The patient was interviewed and re-examined.  The patient's previous History and Physical has been reviewed and is unchanged from my consult.  There is no change in the plan of care: R arm shuntogram, venoplasty of graft.    Adele Barthel, MD [facility] Office: [phone] Pager: [phone]  03/13/2015, 7:21 AM

## 2015-03-17 ENCOUNTER — Telehealth: Payer: Self-pay | Admitting: Vascular Surgery

## 2015-03-17 NOTE — Telephone Encounter (Signed)
-----   Message from Denman George, RN sent at 03/13/2015 11:58 AM EST ----- Regarding: needs 4 week f/u with Dr. Bridgett Larsson   ----- Message -----    From: Conrad Cairo, MD    Sent: 03/13/2015  11:28 AM      To: Vvs Charge Pool  [name] [id number] 08/08/1975  PROCEDURE: 1. right upper arm arteriovenous graft cannulation under ultrasound guidance 2. right arm shuntogram 3. Venoplasty of arteriovenous graft and brachial vein (6 mm x 80 mm) 4. Drug coated venoplasty of brachial vein (6 mm x 60 mm)  Follow-up: 4 weeks

## 2015-03-17 NOTE — Telephone Encounter (Signed)
LM for pt re appt, dpm °

## 2015-03-25 ENCOUNTER — Other Ambulatory Visit (HOSPITAL_COMMUNITY): Payer: Self-pay | Admitting: *Deleted

## 2015-03-26 ENCOUNTER — Inpatient Hospital Stay (HOSPITAL_COMMUNITY)
Admission: RE | Admit: 2015-03-26 | Discharge: 2015-03-26 | Disposition: A | Discharge status: Home / Self Care | Payer: BLUE CROSS/BLUE SHIELD | Source: Ambulatory Visit | Attending: Nephrology | Admitting: Nephrology

## 2015-03-26 NOTE — Progress Notes (Signed)
Pt scheduled for Aranesp injection 03/26/15 @ 1030- no show.

## 2015-04-02 ENCOUNTER — Encounter (HOSPITAL_COMMUNITY)
Admission: RE | Admit: 2015-04-02 | Discharge: 2015-04-02 | Disposition: A | Discharge status: Home / Self Care | Payer: BLUE CROSS/BLUE SHIELD | Source: Ambulatory Visit | Attending: Nephrology | Admitting: Nephrology

## 2015-04-02 DIAGNOSIS — D631 Anemia in chronic kidney disease: Secondary | ICD-10-CM | POA: Insufficient documentation

## 2015-04-02 DIAGNOSIS — Z5181 Encounter for therapeutic drug level monitoring: Secondary | ICD-10-CM | POA: Diagnosis not present

## 2015-04-02 DIAGNOSIS — Z79899 Other long term (current) drug therapy: Secondary | ICD-10-CM | POA: Insufficient documentation

## 2015-04-02 DIAGNOSIS — N185 Chronic kidney disease, stage 5: Secondary | ICD-10-CM | POA: Insufficient documentation

## 2015-04-02 LAB — IRON AND TIBC
Iron: 82 ug/dL (ref 28–170)
Saturation Ratios: 35 % — ABNORMAL HIGH (ref 10.4–31.8)
TIBC: 235 ug/dL — ABNORMAL LOW (ref 250–450)
UIBC: 153 ug/dL

## 2015-04-02 LAB — POCT HEMOGLOBIN-HEMACUE: Hemoglobin: 11 g/dL — ABNORMAL LOW (ref 12.0–15.0)

## 2015-04-02 LAB — FERRITIN: Ferritin: 484 ng/mL — ABNORMAL HIGH (ref 11–307)

## 2015-04-02 MED ORDER — DARBEPOETIN ALFA 100 MCG/0.5ML IJ SOSY
PREFILLED_SYRINGE | INTRAMUSCULAR | Status: AC
  Filled 2015-04-02: qty 0.5

## 2015-04-02 MED ORDER — DARBEPOETIN ALFA 100 MCG/0.5ML IJ SOSY
100.0000 ug | PREFILLED_SYRINGE | INTRAMUSCULAR | Status: DC
  Administered 2015-04-02: 100 ug via SUBCUTANEOUS

## 2015-04-11 ENCOUNTER — Encounter: Payer: BLUE CROSS/BLUE SHIELD | Admitting: Vascular Surgery

## 2015-04-16 ENCOUNTER — Encounter: Payer: Self-pay | Admitting: Vascular Surgery

## 2015-04-25 ENCOUNTER — Ambulatory Visit (INDEPENDENT_AMBULATORY_CARE_PROVIDER_SITE_OTHER): Payer: Self-pay | Admitting: Vascular Surgery

## 2015-04-25 ENCOUNTER — Encounter: Payer: Self-pay | Admitting: Vascular Surgery

## 2015-04-25 VITALS — BP 132/65 | HR 92 | Temp 97.9°F | Resp 20 | Ht 69.5 in | Wt 326.0 lb

## 2015-04-25 DIAGNOSIS — N185 Chronic kidney disease, stage 5: Secondary | ICD-10-CM

## 2015-04-25 NOTE — Progress Notes (Signed)
    Postoperative Access Visit   History of Present Illness  [name] is a 40 y.o. year old female who presents for postoperative follow-up for: venoplasty of RUA AVG (Date: 03/13/15).  The patient notes no steal symptoms.  The patient is able to complete their activities of daily living.  The patient's current symptoms are: none.  For VQI Use Only  PRE-ADM LIVING: Home  AMB STATUS: Ambulatory  Physical Examination Filed Vitals:   04/25/15 1239  BP: 132/65  Pulse: 92  Temp: 97.9 F (36.6 C)  Resp: 20    RUE: Incision is healed, skin feels warm, hand grip is 5/5, sensation in digits is intact, palpable thrill, bruit can be auscultated (improved)  Medical Decision Making  [name] is a 40 y.o. year old female who presents s/p venoplasty of RUA AVG   Unfortunately, young patient generally have vigorous neointimal hyperplasia with AVG as has this patient.  This most recent intervention was with a drug coated balloon, so hopefully this will slow down recurrent of her neointimal hyperplasia.  The patient's access is ready for use.  Follow in 3 month for RUA access duplex.  Thank you for allowing [place] to participate in this patient's care.  Adele Barthel, MD [facility] Office: [phone] Pager: [phone]  04/25/2015, 8:24 AM

## 2015-04-30 ENCOUNTER — Encounter (HOSPITAL_COMMUNITY)
Admission: RE | Admit: 2015-04-30 | Discharge: 2015-04-30 | Disposition: A | Discharge status: Home / Self Care | Payer: BLUE CROSS/BLUE SHIELD | Source: Ambulatory Visit | Attending: Nephrology | Admitting: Nephrology

## 2015-04-30 DIAGNOSIS — Z5181 Encounter for therapeutic drug level monitoring: Secondary | ICD-10-CM | POA: Diagnosis not present

## 2015-04-30 DIAGNOSIS — N185 Chronic kidney disease, stage 5: Secondary | ICD-10-CM | POA: Insufficient documentation

## 2015-04-30 DIAGNOSIS — Z79899 Other long term (current) drug therapy: Secondary | ICD-10-CM | POA: Diagnosis not present

## 2015-04-30 DIAGNOSIS — D631 Anemia in chronic kidney disease: Secondary | ICD-10-CM | POA: Insufficient documentation

## 2015-04-30 LAB — IRON AND TIBC
Iron: 52 ug/dL (ref 28–170)
SATURATION RATIOS: 21 % (ref 10.4–31.8)
TIBC: 248 ug/dL — AB (ref 250–450)
UIBC: 196 ug/dL

## 2015-04-30 LAB — POCT HEMOGLOBIN-HEMACUE: HEMOGLOBIN: 10.5 g/dL — AB (ref 12.0–15.0)

## 2015-04-30 LAB — FERRITIN: Ferritin: 456 ng/mL — ABNORMAL HIGH (ref 11–307)

## 2015-04-30 MED ORDER — DARBEPOETIN ALFA 100 MCG/0.5ML IJ SOSY
PREFILLED_SYRINGE | INTRAMUSCULAR | Status: AC
Start: 2015-04-30 — End: 2015-04-30
  Filled 2015-04-30: qty 0.5

## 2015-04-30 MED ORDER — DARBEPOETIN ALFA 100 MCG/0.5ML IJ SOSY
100.0000 ug | PREFILLED_SYRINGE | INTRAMUSCULAR | Status: DC
  Administered 2015-04-30: 100 ug via SUBCUTANEOUS

## 2015-05-27 ENCOUNTER — Other Ambulatory Visit (HOSPITAL_COMMUNITY): Payer: Self-pay | Admitting: *Deleted

## 2015-05-28 ENCOUNTER — Encounter (HOSPITAL_COMMUNITY)
Admission: RE | Admit: 2015-05-28 | Discharge: 2015-05-28 | Disposition: A | Discharge status: Home / Self Care | Payer: BLUE CROSS/BLUE SHIELD | Source: Ambulatory Visit | Attending: Nephrology | Admitting: Nephrology

## 2015-05-28 DIAGNOSIS — N185 Chronic kidney disease, stage 5: Secondary | ICD-10-CM | POA: Insufficient documentation

## 2015-05-28 DIAGNOSIS — Z5181 Encounter for therapeutic drug level monitoring: Secondary | ICD-10-CM | POA: Insufficient documentation

## 2015-05-28 DIAGNOSIS — Z79899 Other long term (current) drug therapy: Secondary | ICD-10-CM | POA: Insufficient documentation

## 2015-05-28 DIAGNOSIS — D631 Anemia in chronic kidney disease: Secondary | ICD-10-CM | POA: Diagnosis not present

## 2015-05-28 MED ORDER — DARBEPOETIN ALFA 100 MCG/0.5ML IJ SOSY
100.0000 ug | PREFILLED_SYRINGE | INTRAMUSCULAR | Status: DC
  Administered 2015-05-28: 100 ug via SUBCUTANEOUS

## 2015-05-28 MED ORDER — DARBEPOETIN ALFA 100 MCG/0.5ML IJ SOSY
PREFILLED_SYRINGE | INTRAMUSCULAR | Status: AC
  Filled 2015-05-28: qty 0.5

## 2015-05-28 MED ORDER — SODIUM CHLORIDE 0.9 % IV SOLN
510.0000 mg | Freq: Once | INTRAVENOUS | Status: AC
  Administered 2015-05-28: 510 mg via INTRAVENOUS
  Filled 2015-05-28: qty 17

## 2015-05-29 LAB — POCT HEMOGLOBIN-HEMACUE: Hemoglobin: 11.2 g/dL — ABNORMAL LOW (ref 12.0–15.0)

## 2015-05-30 ENCOUNTER — Other Ambulatory Visit: Payer: Self-pay | Admitting: Obstetrics and Gynecology

## 2015-05-30 ENCOUNTER — Encounter: Payer: Self-pay | Admitting: Obstetrics and Gynecology

## 2015-05-30 ENCOUNTER — Ambulatory Visit (INDEPENDENT_AMBULATORY_CARE_PROVIDER_SITE_OTHER): Payer: BLUE CROSS/BLUE SHIELD | Admitting: Obstetrics and Gynecology

## 2015-05-30 VITALS — Wt 317.5 lb

## 2015-05-30 DIAGNOSIS — Z124 Encounter for screening for malignant neoplasm of cervix: Secondary | ICD-10-CM | POA: Diagnosis not present

## 2015-05-30 DIAGNOSIS — N921 Excessive and frequent menstruation with irregular cycle: Secondary | ICD-10-CM | POA: Diagnosis not present

## 2015-05-30 DIAGNOSIS — Z01419 Encounter for gynecological examination (general) (routine) without abnormal findings: Secondary | ICD-10-CM | POA: Diagnosis not present

## 2015-05-30 DIAGNOSIS — Z1231 Encounter for screening mammogram for malignant neoplasm of breast: Secondary | ICD-10-CM

## 2015-05-30 DIAGNOSIS — Z1239 Encounter for other screening for malignant neoplasm of breast: Secondary | ICD-10-CM

## 2015-05-30 DIAGNOSIS — Z1151 Encounter for screening for human papillomavirus (HPV): Secondary | ICD-10-CM

## 2015-05-30 MED ORDER — MEGESTROL ACETATE 20 MG PO TABS: 20.0000 mg | ORAL_TABLET | Freq: Two times a day (BID) | ORAL | Status: DC

## 2015-05-30 NOTE — Progress Notes (Signed)
Subjective:     [name] is a 40 y.o. female P0 who is here for a comprehensive physical exam. The patient reports no problems. She denies any vaginal bleeding while taking the Megace. She desires to continue taking it. She is not sexually active. She does not want to change to IUD at this time  Past Medical History  Diagnosis Date  . Hypertension   . Thyroid disease   . Morbid obesity ([place])   . Hyperlipidemia   . History of pneumonia   . Foot fracture   . Dilated cardiomyopathy ([place])   . CHF (congestive heart failure) ([place])     hosp. 2011  . Gout   . Anxiety     pt not aware of this  . Arthritis   . Anemia   . Menorrhagia   . Chronic kidney disease     not on dialysis at this time  . Encounter for blood transfusion   . History of blood transfusion   . Shortness of breath     seldom  . Pneumonia     hosp. 2010  . Heart murmur     as a child   Past Surgical History  Procedure Laterality Date  . Cardiac surgery  1979    repair of hole in heart  . Av fistula placement  08/04/2011    Procedure: ARTERIOVENOUS (AV) FISTULA CREATION;  Surgeon: Elam Dutch, MD;  Location: [facility] OR;  Service: Vascular;  Laterality: Right;  Creation right brachiocephalic arteriovenous fistula  . Av fistula placement Left 02/05/2013    Procedure: ARTERIOVENOUS FISTULA CREATION LEFT ARM;  Surgeon: Conrad [place], MD;  Location: [place];  Service: Vascular;  Laterality: Left;  . Thrombectomy w/ embolectomy Left 03/05/2013    Procedure: THROMBECTOMY OF LEFT BRACHIAL ARTERY;  Surgeon: Conrad Garrett, MD;  Location: [place];  Service: Vascular;  Laterality: Left;  . Ligation of arteriovenous  fistula Left 03/05/2013    Procedure: LIGATION OF ARTERIOVENOUS  FISTULA;  Surgeon: Conrad Cushing, MD;  Location: [place];  Service: Vascular;  Laterality: Left;  . Patch angioplasty Left 03/05/2013    Procedure: PATCH ANGIOPLASTY;  Surgeon: Conrad Robinhood, MD;  Location: [place];  Service: Vascular;  Laterality: Left;  . Av  fistula placement Right 06/04/2013    Procedure: ARTERIOVENOUS (AV) FISTULA CREATION;  Surgeon: Conrad [place], MD;  Location: [place];  Service: Vascular;  Laterality: Right;  . Bascilic vein transposition Right 08/13/2013    Procedure: Second Stage Brachial Vein Transposition;  Surgeon: Conrad Metamora, MD;  Location: [place];  Service: Vascular;  Laterality: Right;  . Venogram Bilateral 03/25/2014    Procedure: VENOGRAM bilateral;  Surgeon: Conrad Shrewsbury, MD;  Location: [facility] CATH LAB;  Service: Cardiovascular;  Laterality: Bilateral;  . Av fistula placement Right 04/15/2014    Procedure: INSERTION OF ARTERIOVENOUS (AV) GORE-TEX GRAFT ARM;  Surgeon: Conrad Morrill, MD;  Location: [place];  Service: Vascular;  Laterality: Right;  . Peripheral vascular catheterization Right 10/31/2014    Procedure: Fistulagram;  Surgeon: Conrad Marion, MD;  Location: [place] CV LAB;  Service: Cardiovascular;  Laterality: Right;  . Peripheral vascular catheterization Right 10/31/2014    Procedure: Peripheral Vascular Balloon Angioplasty;  Surgeon: Conrad [employer], MD;  Location: [place] [facility];  Service: Cardiovascular;  Laterality: Right;  pta venous rt arm  . Peripheral vascular catheterization N/A 03/13/2015    Procedure: A/V Shuntogram;  Surgeon: Conrad Chowan, MD;  Location: [place] [facility] LAB;  Service: Cardiovascular;  Laterality: N/A;  . Peripheral vascular catheterization Right 03/13/2015    Procedure: Peripheral Vascular Balloon Angioplasty;  Surgeon: Conrad [place], MD;  Location: [place] [facility];  Service: Cardiovascular;  Laterality: Right;  Shunt   Family History  Problem Relation Age of Onset  . Diabetes Mother   . Hyperlipidemia Mother   . Hypertension Mother   . Kidney disease Mother   . Anesthesia problems Neg Hx   . Hypotension Neg Hx   . Malignant hyperthermia Neg Hx   . Pseudochol deficiency Neg Hx     Social History   Social History  . Marital Status: Married    Spouse Name: N/A  . Number of  Children: N/A  . Years of Education: N/A   Occupational History  . Not on file.   Social History Main Topics  . Smoking status: Never Smoker   . Smokeless tobacco: Never Used  . Alcohol Use: No  . Drug Use: No  . Sexual Activity: Yes    Birth Control/ Protection: None     Comment: no birth control    Other Topics Concern  . Not on file   Social History Narrative   Health Maintenance  Topic Date Due  . HIV Screening  07/10/1989  . TETANUS/TDAP  07/10/1993  . INFLUENZA VACCINE  08/05/2015  . PAP SMEAR  04/25/2016       Review of Systems Pertinent items are noted in HPI.   Objective:       GENERAL: Well-developed, well-nourished female in no acute distress.  HEENT: Normocephalic, atraumatic. Sclerae anicteric.  NECK: Supple. Normal thyroid.  LUNGS: Clear to auscultation bilaterally.  HEART: Regular rate and rhythm. BREASTS: Symmetric in size. No palpable masses or lymphadenopathy, skin changes, or nipple drainage. ABDOMEN: Soft, nontender, nondistended. No organomegaly. PELVIC: Normal external female genitalia. Vagina is pink and rugated.  Normal discharge. Normal appearing cervix. Bimanual exam limited secondary to body habitus but no pelvic masses were palpated EXTREMITIES: No cyanosis, clubbing, or edema, 2+ distal pulses.    Assessment:    Healthy female exam.      Plan:    pap smear collected Referral for screening mammogram provided Patient will be contacted with any abnormal results Refill on Megace provided See After Visit Summary for Counseling Recommendations

## 2015-06-03 LAB — CYTOLOGY - PAP

## 2015-06-11 ENCOUNTER — Ambulatory Visit
Admission: RE | Admit: 2015-06-11 | Discharge: 2015-06-11 | Disposition: A | Discharge status: Home / Self Care | Payer: BLUE CROSS/BLUE SHIELD | Source: Ambulatory Visit | Attending: Obstetrics and Gynecology | Admitting: Obstetrics and Gynecology

## 2015-06-11 DIAGNOSIS — Z1231 Encounter for screening mammogram for malignant neoplasm of breast: Secondary | ICD-10-CM

## 2015-06-17 ENCOUNTER — Other Ambulatory Visit: Payer: Self-pay | Admitting: Obstetrics and Gynecology

## 2015-06-17 DIAGNOSIS — R928 Other abnormal and inconclusive findings on diagnostic imaging of breast: Secondary | ICD-10-CM

## 2015-06-24 ENCOUNTER — Other Ambulatory Visit: Payer: BLUE CROSS/BLUE SHIELD

## 2015-06-25 ENCOUNTER — Encounter (HOSPITAL_COMMUNITY)
Admission: RE | Admit: 2015-06-25 | Discharge: 2015-06-25 | Disposition: A | Discharge status: Home / Self Care | Payer: BLUE CROSS/BLUE SHIELD | Source: Ambulatory Visit | Attending: Nephrology | Admitting: Nephrology

## 2015-06-25 DIAGNOSIS — D631 Anemia in chronic kidney disease: Secondary | ICD-10-CM | POA: Insufficient documentation

## 2015-06-25 DIAGNOSIS — N185 Chronic kidney disease, stage 5: Secondary | ICD-10-CM | POA: Insufficient documentation

## 2015-06-25 DIAGNOSIS — Z79899 Other long term (current) drug therapy: Secondary | ICD-10-CM | POA: Insufficient documentation

## 2015-06-25 DIAGNOSIS — Z5181 Encounter for therapeutic drug level monitoring: Secondary | ICD-10-CM | POA: Diagnosis not present

## 2015-06-25 LAB — POCT HEMOGLOBIN-HEMACUE: Hemoglobin: 10.7 g/dL — ABNORMAL LOW (ref 12.0–15.0)

## 2015-06-25 LAB — FERRITIN: FERRITIN: 646 ng/mL — AB (ref 11–307)

## 2015-06-25 LAB — IRON AND TIBC
IRON: 49 ug/dL (ref 28–170)
SATURATION RATIOS: 20 % (ref 10.4–31.8)
TIBC: 244 ug/dL — ABNORMAL LOW (ref 250–450)
UIBC: 195 ug/dL

## 2015-06-25 MED ORDER — DARBEPOETIN ALFA 100 MCG/0.5ML IJ SOSY
PREFILLED_SYRINGE | INTRAMUSCULAR | Status: AC
  Filled 2015-06-25: qty 0.5

## 2015-06-25 MED ORDER — DARBEPOETIN ALFA 100 MCG/0.5ML IJ SOSY
100.0000 ug | PREFILLED_SYRINGE | INTRAMUSCULAR | Status: DC
  Administered 2015-06-25: 100 ug via SUBCUTANEOUS

## 2015-07-23 ENCOUNTER — Encounter (HOSPITAL_COMMUNITY)
Admission: RE | Admit: 2015-07-23 | Discharge: 2015-07-23 | Disposition: A | Discharge status: Home / Self Care | Payer: BLUE CROSS/BLUE SHIELD | Source: Ambulatory Visit | Attending: Nephrology | Admitting: Nephrology

## 2015-07-23 DIAGNOSIS — D631 Anemia in chronic kidney disease: Secondary | ICD-10-CM | POA: Diagnosis not present

## 2015-07-23 DIAGNOSIS — Z5181 Encounter for therapeutic drug level monitoring: Secondary | ICD-10-CM | POA: Diagnosis not present

## 2015-07-23 DIAGNOSIS — Z79899 Other long term (current) drug therapy: Secondary | ICD-10-CM | POA: Insufficient documentation

## 2015-07-23 DIAGNOSIS — N185 Chronic kidney disease, stage 5: Secondary | ICD-10-CM | POA: Diagnosis not present

## 2015-07-23 LAB — POCT HEMOGLOBIN-HEMACUE: Hemoglobin: 10 g/dL — ABNORMAL LOW (ref 12.0–15.0)

## 2015-07-23 LAB — IRON AND TIBC
IRON: 77 ug/dL (ref 28–170)
Saturation Ratios: 30 % (ref 10.4–31.8)
TIBC: 253 ug/dL (ref 250–450)
UIBC: 176 ug/dL

## 2015-07-23 LAB — FERRITIN: Ferritin: 698 ng/mL — ABNORMAL HIGH (ref 11–307)

## 2015-07-23 MED ORDER — DARBEPOETIN ALFA 100 MCG/0.5ML IJ SOSY
PREFILLED_SYRINGE | INTRAMUSCULAR | Status: AC
  Administered 2015-07-23: 100 ug via SUBCUTANEOUS
  Filled 2015-07-23: qty 0.5

## 2015-07-23 MED ORDER — DARBEPOETIN ALFA 100 MCG/0.5ML IJ SOSY
100.0000 ug | PREFILLED_SYRINGE | INTRAMUSCULAR | Status: DC
  Administered 2015-07-23: 100 ug via SUBCUTANEOUS

## 2015-07-24 ENCOUNTER — Other Ambulatory Visit: Payer: Self-pay

## 2015-07-24 ENCOUNTER — Ambulatory Visit (HOSPITAL_COMMUNITY)
Admission: RE | Admit: 2015-07-24 | Discharge: 2015-07-24 | Disposition: A | Discharge status: Home / Self Care | Payer: BLUE CROSS/BLUE SHIELD | Source: Ambulatory Visit | Attending: Vascular Surgery | Admitting: Vascular Surgery

## 2015-07-24 DIAGNOSIS — I132 Hypertensive heart and chronic kidney disease with heart failure and with stage 5 chronic kidney disease, or end stage renal disease: Secondary | ICD-10-CM | POA: Diagnosis not present

## 2015-07-24 DIAGNOSIS — T8249XA Other complication of vascular dialysis catheter, initial encounter: Secondary | ICD-10-CM

## 2015-07-24 DIAGNOSIS — Z4931 Encounter for adequacy testing for hemodialysis: Secondary | ICD-10-CM | POA: Insufficient documentation

## 2015-07-24 DIAGNOSIS — E785 Hyperlipidemia, unspecified: Secondary | ICD-10-CM | POA: Diagnosis not present

## 2015-07-24 DIAGNOSIS — E079 Disorder of thyroid, unspecified: Secondary | ICD-10-CM | POA: Diagnosis not present

## 2015-07-24 DIAGNOSIS — I509 Heart failure, unspecified: Secondary | ICD-10-CM | POA: Diagnosis not present

## 2015-07-24 DIAGNOSIS — I742 Embolism and thrombosis of arteries of the upper extremities: Secondary | ICD-10-CM | POA: Insufficient documentation

## 2015-07-24 DIAGNOSIS — N186 End stage renal disease: Secondary | ICD-10-CM | POA: Diagnosis not present

## 2015-07-25 ENCOUNTER — Encounter (HOSPITAL_COMMUNITY): Payer: BLUE CROSS/BLUE SHIELD

## 2015-07-25 ENCOUNTER — Other Ambulatory Visit: Payer: Self-pay

## 2015-07-25 ENCOUNTER — Ambulatory Visit: Payer: BLUE CROSS/BLUE SHIELD | Admitting: Vascular Surgery

## 2015-07-25 NOTE — Progress Notes (Signed)
Unable to reach pt by phone for pre-op call. Left pre-op instructions on pt's voicemail 

## 2015-07-28 ENCOUNTER — Encounter (HOSPITAL_COMMUNITY): Payer: Self-pay | Admitting: *Deleted

## 2015-07-28 ENCOUNTER — Emergency Department (HOSPITAL_COMMUNITY)
Admission: EM | Admit: 2015-07-28 | Discharge: 2015-07-28 | Disposition: A | Discharge status: Home / Self Care | Payer: BLUE CROSS/BLUE SHIELD | Source: Home / Self Care | Attending: Emergency Medicine | Admitting: Emergency Medicine

## 2015-07-28 ENCOUNTER — Encounter (HOSPITAL_COMMUNITY): Payer: Self-pay | Admitting: Surgery

## 2015-07-28 ENCOUNTER — Encounter: Payer: Self-pay | Admitting: Vascular Surgery

## 2015-07-28 ENCOUNTER — Ambulatory Visit (HOSPITAL_COMMUNITY)
Admission: RE | Admit: 2015-07-28 | Discharge: 2015-07-28 | Disposition: A | Discharge status: Home / Self Care | Payer: BLUE CROSS/BLUE SHIELD | Source: Ambulatory Visit | Attending: Vascular Surgery | Admitting: Vascular Surgery

## 2015-07-28 ENCOUNTER — Emergency Department (HOSPITAL_COMMUNITY): Payer: BLUE CROSS/BLUE SHIELD | Admitting: Certified Registered"

## 2015-07-28 ENCOUNTER — Emergency Department (HOSPITAL_COMMUNITY): Payer: BLUE CROSS/BLUE SHIELD

## 2015-07-28 ENCOUNTER — Encounter (HOSPITAL_COMMUNITY)
Admission: EM | Disposition: A | Discharge status: Home / Self Care | Payer: Self-pay | Source: Home / Self Care | Attending: Emergency Medicine

## 2015-07-28 ENCOUNTER — Encounter (HOSPITAL_COMMUNITY)
Admission: RE | Disposition: A | Discharge status: Home / Self Care | Payer: Self-pay | Source: Ambulatory Visit | Attending: Vascular Surgery

## 2015-07-28 ENCOUNTER — Ambulatory Visit (HOSPITAL_COMMUNITY): Payer: BLUE CROSS/BLUE SHIELD | Admitting: Anesthesiology

## 2015-07-28 DIAGNOSIS — E782 Mixed hyperlipidemia: Secondary | ICD-10-CM

## 2015-07-28 DIAGNOSIS — W540XXA Bitten by dog, initial encounter: Secondary | ICD-10-CM | POA: Insufficient documentation

## 2015-07-28 DIAGNOSIS — I130 Hypertensive heart and chronic kidney disease with heart failure and stage 1 through stage 4 chronic kidney disease, or unspecified chronic kidney disease: Secondary | ICD-10-CM | POA: Insufficient documentation

## 2015-07-28 DIAGNOSIS — M109 Gout, unspecified: Secondary | ICD-10-CM

## 2015-07-28 DIAGNOSIS — I5022 Chronic systolic (congestive) heart failure: Secondary | ICD-10-CM

## 2015-07-28 DIAGNOSIS — I132 Hypertensive heart and chronic kidney disease with heart failure and with stage 5 chronic kidney disease, or end stage renal disease: Secondary | ICD-10-CM | POA: Insufficient documentation

## 2015-07-28 DIAGNOSIS — F419 Anxiety disorder, unspecified: Secondary | ICD-10-CM | POA: Diagnosis not present

## 2015-07-28 DIAGNOSIS — N185 Chronic kidney disease, stage 5: Secondary | ICD-10-CM | POA: Insufficient documentation

## 2015-07-28 DIAGNOSIS — I420 Dilated cardiomyopathy: Secondary | ICD-10-CM | POA: Insufficient documentation

## 2015-07-28 DIAGNOSIS — D649 Anemia, unspecified: Secondary | ICD-10-CM

## 2015-07-28 DIAGNOSIS — I509 Heart failure, unspecified: Secondary | ICD-10-CM | POA: Diagnosis not present

## 2015-07-28 DIAGNOSIS — T82868A Thrombosis of vascular prosthetic devices, implants and grafts, initial encounter: Secondary | ICD-10-CM | POA: Insufficient documentation

## 2015-07-28 DIAGNOSIS — Y92009 Unspecified place in unspecified non-institutional (private) residence as the place of occurrence of the external cause: Secondary | ICD-10-CM | POA: Insufficient documentation

## 2015-07-28 DIAGNOSIS — S61451A Open bite of right hand, initial encounter: Secondary | ICD-10-CM | POA: Insufficient documentation

## 2015-07-28 DIAGNOSIS — L03113 Cellulitis of right upper limb: Secondary | ICD-10-CM | POA: Insufficient documentation

## 2015-07-28 DIAGNOSIS — N189 Chronic kidney disease, unspecified: Secondary | ICD-10-CM | POA: Insufficient documentation

## 2015-07-28 DIAGNOSIS — Z79899 Other long term (current) drug therapy: Secondary | ICD-10-CM

## 2015-07-28 DIAGNOSIS — Y9389 Activity, other specified: Secondary | ICD-10-CM

## 2015-07-28 DIAGNOSIS — E785 Hyperlipidemia, unspecified: Secondary | ICD-10-CM | POA: Insufficient documentation

## 2015-07-28 DIAGNOSIS — Y832 Surgical operation with anastomosis, bypass or graft as the cause of abnormal reaction of the patient, or of later complication, without mention of misadventure at the time of the procedure: Secondary | ICD-10-CM | POA: Insufficient documentation

## 2015-07-28 DIAGNOSIS — L089 Local infection of the skin and subcutaneous tissue, unspecified: Secondary | ICD-10-CM

## 2015-07-28 HISTORY — PX: ANGIOPLASTY: SHX39

## 2015-07-28 HISTORY — PX: INCISION AND DRAINAGE OF WOUND: SHX1803

## 2015-07-28 LAB — HCG, SERUM, QUALITATIVE: Preg, Serum: NEGATIVE

## 2015-07-28 LAB — POCT I-STAT 4, (NA,K, GLUC, HGB,HCT)
GLUCOSE: 89 mg/dL (ref 65–99)
HEMATOCRIT: 35 % — AB (ref 36.0–46.0)
HEMOGLOBIN: 11.9 g/dL — AB (ref 12.0–15.0)
Potassium: 3.4 mmol/L — ABNORMAL LOW (ref 3.5–5.1)
SODIUM: 144 mmol/L (ref 135–145)

## 2015-07-28 SURGERY — (RADIOFREQUENCY) ABLATION
Anesthesia: Monitor Anesthesia Care | Site: Arm Upper | Laterality: Right

## 2015-07-28 SURGERY — IRRIGATION AND DEBRIDEMENT WOUND
Anesthesia: Monitor Anesthesia Care | Site: Hand | Laterality: Right

## 2015-07-28 MED ORDER — PROMETHAZINE HCL 25 MG/ML IJ SOLN: 6.2500 mg | INTRAMUSCULAR | Status: DC | PRN

## 2015-07-28 MED ORDER — BUPIVACAINE HCL (PF) 0.25 % IJ SOLN
INTRAMUSCULAR | Status: AC
  Filled 2015-07-28: qty 30

## 2015-07-28 MED ORDER — SODIUM CHLORIDE 0.9 % IV SOLN
INTRAVENOUS | Status: AC
  Administered 2015-07-28: 1000 mL
  Filled 2015-07-28: qty 1000

## 2015-07-28 MED ORDER — FENTANYL CITRATE (PF) 250 MCG/5ML IJ SOLN
INTRAMUSCULAR | Status: AC
  Filled 2015-07-28: qty 5

## 2015-07-28 MED ORDER — LIDOCAINE HCL 2 % IJ SOLN
INTRAMUSCULAR | Status: DC | PRN
  Administered 2015-07-28: 2.5 mL

## 2015-07-28 MED ORDER — MEPERIDINE HCL 25 MG/ML IJ SOLN: 6.2500 mg | INTRAMUSCULAR | Status: DC | PRN

## 2015-07-28 MED ORDER — FENTANYL CITRATE (PF) 100 MCG/2ML IJ SOLN
INTRAMUSCULAR | Status: AC
  Filled 2015-07-28: qty 2

## 2015-07-28 MED ORDER — ALTEPLASE 2 MG IJ SOLR
5.0000 mg | INTRAMUSCULAR | Status: AC
  Administered 2015-07-28: 6 mg
  Filled 2015-07-28: qty 6

## 2015-07-28 MED ORDER — ACETAMINOPHEN 325 MG PO TABS: 650.0000 mg | ORAL_TABLET | Freq: Four times a day (QID) | ORAL | Status: DC | PRN

## 2015-07-28 MED ORDER — SODIUM CHLORIDE 0.9 % IV SOLN
INTRAVENOUS | Status: DC | PRN
  Administered 2015-07-28 (×2): via INTRAVENOUS

## 2015-07-28 MED ORDER — LIDOCAINE HCL (PF) 1 % IJ SOLN
INTRAMUSCULAR | Status: AC
  Filled 2015-07-28: qty 30

## 2015-07-28 MED ORDER — SODIUM CHLORIDE 0.9 % IV SOLN
INTRAVENOUS | Status: DC | PRN
  Administered 2015-07-28: 22:00:00 via INTRAVENOUS

## 2015-07-28 MED ORDER — GLYCOPYRROLATE 0.2 MG/ML IJ SOLN
INTRAMUSCULAR | Status: DC | PRN
  Administered 2015-07-28: 0.2 mg via INTRAVENOUS

## 2015-07-28 MED ORDER — HYDROCODONE-ACETAMINOPHEN 5-325 MG PO TABS
ORAL_TABLET | ORAL | Status: AC
  Filled 2015-07-28: qty 1

## 2015-07-28 MED ORDER — SODIUM CHLORIDE 0.9 % IR SOLN
Status: DC | PRN
  Administered 2015-07-28: 1000 mL

## 2015-07-28 MED ORDER — ONDANSETRON HCL 4 MG/2ML IJ SOLN
INTRAMUSCULAR | Status: AC
  Filled 2015-07-28: qty 2

## 2015-07-28 MED ORDER — MORPHINE SULFATE (PF) 2 MG/ML IV SOLN
2.0000 mg | Freq: Once | INTRAVENOUS | Status: AC
  Administered 2015-07-28: 2 mg via INTRAVENOUS
  Filled 2015-07-28: qty 1

## 2015-07-28 MED ORDER — 0.9 % SODIUM CHLORIDE (POUR BTL) OPTIME
TOPICAL | Status: DC | PRN
  Administered 2015-07-28: 1000 mL

## 2015-07-28 MED ORDER — IODIXANOL 320 MG/ML IV SOLN
INTRAVENOUS | Status: DC | PRN
  Administered 2015-07-28: 20 mL via INTRAVENOUS

## 2015-07-28 MED ORDER — PROPOFOL 10 MG/ML IV BOLUS
INTRAVENOUS | Status: AC
  Filled 2015-07-28: qty 20

## 2015-07-28 MED ORDER — PHENYLEPHRINE 40 MCG/ML (10ML) SYRINGE FOR IV PUSH (FOR BLOOD PRESSURE SUPPORT)
PREFILLED_SYRINGE | INTRAVENOUS | Status: AC
  Filled 2015-07-28: qty 20

## 2015-07-28 MED ORDER — CHLORHEXIDINE GLUCONATE CLOTH 2 % EX PADS: 6.0000 | MEDICATED_PAD | Freq: Once | CUTANEOUS | Status: DC

## 2015-07-28 MED ORDER — PROPOFOL 500 MG/50ML IV EMUL
INTRAVENOUS | Status: DC | PRN
  Administered 2015-07-28: 14:00:00 via INTRAVENOUS
  Administered 2015-07-28: 75 ug/kg/min via INTRAVENOUS

## 2015-07-28 MED ORDER — SODIUM CHLORIDE 0.9 % IV SOLN
INTRAVENOUS | Status: DC | PRN
  Administered 2015-07-28: 500 mL

## 2015-07-28 MED ORDER — FENTANYL CITRATE (PF) 100 MCG/2ML IJ SOLN
INTRAMUSCULAR | Status: DC | PRN
  Administered 2015-07-28 (×2): 25 ug via INTRAVENOUS
  Administered 2015-07-28: 50 ug via INTRAVENOUS

## 2015-07-28 MED ORDER — LIDOCAINE HCL (PF) 1 % IJ SOLN
INTRAMUSCULAR | Status: DC | PRN
  Administered 2015-07-28: 5 mL

## 2015-07-28 MED ORDER — DEXTROSE 5 % IV SOLN
1.5000 g | INTRAVENOUS | Status: AC
  Administered 2015-07-28: 1.5 g via INTRAVENOUS

## 2015-07-28 MED ORDER — HYDROCODONE-ACETAMINOPHEN 5-325 MG PO TABS
1.0000 | ORAL_TABLET | Freq: Once | ORAL | Status: AC
  Administered 2015-07-28: 1 via ORAL

## 2015-07-28 MED ORDER — LIDOCAINE 2% (20 MG/ML) 5 ML SYRINGE
INTRAMUSCULAR | Status: AC
  Filled 2015-07-28: qty 5

## 2015-07-28 MED ORDER — FENTANYL CITRATE (PF) 100 MCG/2ML IJ SOLN
25.0000 ug | INTRAMUSCULAR | Status: DC | PRN
  Administered 2015-07-28: 50 ug via INTRAVENOUS

## 2015-07-28 MED ORDER — LIDOCAINE HCL 2 % IJ SOLN
INTRAMUSCULAR | Status: AC
  Filled 2015-07-28: qty 20

## 2015-07-28 MED ORDER — BUPIVACAINE HCL (PF) 0.25 % IJ SOLN
INTRAMUSCULAR | Status: DC | PRN
  Administered 2015-07-28: 2.5 mL

## 2015-07-28 MED ORDER — FENTANYL CITRATE (PF) 100 MCG/2ML IJ SOLN
25.0000 ug | INTRAMUSCULAR | Status: DC | PRN
Start: 2015-07-28 — End: 2015-07-29
  Administered 2015-07-28 (×3): 50 ug via INTRAVENOUS

## 2015-07-28 MED ORDER — AMPICILLIN-SULBACTAM SODIUM 1.5 (1-0.5) G IJ SOLR
1.5000 g | Freq: Once | INTRAMUSCULAR | Status: AC
  Administered 2015-07-28: 1.5 g via INTRAVENOUS
  Filled 2015-07-28: qty 1.5

## 2015-07-28 MED ORDER — DEXTROSE 5 % IV SOLN
INTRAVENOUS | Status: AC
  Filled 2015-07-28: qty 1.5

## 2015-07-28 MED ORDER — LACTATED RINGERS IV SOLN: INTRAVENOUS | Status: DC

## 2015-07-28 MED ORDER — SODIUM CHLORIDE 0.9 % IV SOLN
INTRAVENOUS | Status: DC
  Administered 2015-07-28: 10:00:00 via INTRAVENOUS

## 2015-07-28 SURGICAL SUPPLY — 61 items
AGENT HMST SPONGE THK3/8 (HEMOSTASIS)
ARMBAND PINK RESTRICT EXTREMIT (MISCELLANEOUS) ×4 IMPLANT
BAG BANDED W/RUBBER/TAPE 36X54 (MISCELLANEOUS) ×2 IMPLANT
BAG EQP BAND 135X91 W/RBR TAPE (MISCELLANEOUS) ×3
BAG SNAP BAND KOVER 36X36 (MISCELLANEOUS) ×2 IMPLANT
BALLN MUSTANG 6X80X75 (BALLOONS) ×4
BALLN MUSTANG 7.0X40 75 (BALLOONS) ×4
BALLOON MUSTANG 6X80X75 (BALLOONS) ×1 IMPLANT
BALLOON MUSTANG 7.0X40 75 (BALLOONS) ×1 IMPLANT
CANISTER SUCTION 2500CC (MISCELLANEOUS) ×4 IMPLANT
CATH EMB 3FR 40CM (CATHETERS) ×4 IMPLANT
CATH EMB 4FR 80CM (CATHETERS) ×4 IMPLANT
CATH SOFT-VU 4F 65 STRAIGHT (CATHETERS) ×1 IMPLANT
CATH SOFT-VU STRAIGHT 4F 65CM (CATHETERS) ×4
CLIP TI MEDIUM 6 (CLIP) ×4 IMPLANT
CLIP TI WIDE RED SMALL 6 (CLIP) ×4 IMPLANT
CONT SPEC 4OZ CLIKSEAL STRL BL (MISCELLANEOUS) ×2 IMPLANT
COVER DOME SNAP 22 D (MISCELLANEOUS) ×2 IMPLANT
COVER PROBE W GEL 5X96 (DRAPES) ×8 IMPLANT
DRSG TEGADERM 2-3/8X2-3/4 SM (GAUZE/BANDAGES/DRESSINGS) ×4 IMPLANT
ELECT REM PT RETURN 9FT ADLT (ELECTROSURGICAL) ×4
ELECTRODE REM PT RTRN 9FT ADLT (ELECTROSURGICAL) ×3 IMPLANT
GAUZE SPONGE 2X2 8PLY STRL LF (GAUZE/BANDAGES/DRESSINGS) ×2 IMPLANT
GEL ULTRASOUND 20GR AQUASONIC (MISCELLANEOUS) IMPLANT
GLOVE BIO SURGEON STRL SZ7 (GLOVE) ×6 IMPLANT
GLOVE BIOGEL PI IND STRL 7.5 (GLOVE) ×3 IMPLANT
GLOVE BIOGEL PI INDICATOR 7.5 (GLOVE) ×1
GLOVE SURG SS PI 7.0 STRL IVOR (GLOVE) ×2 IMPLANT
GOWN STRL REUS W/ TWL LRG LVL3 (GOWN DISPOSABLE) ×8 IMPLANT
GOWN STRL REUS W/TWL LRG LVL3 (GOWN DISPOSABLE) ×8
GUIDEWIRE ANGLED .035X150CM (WIRE) ×2 IMPLANT
HEMOSTAT SPONGE AVITENE ULTRA (HEMOSTASIS) IMPLANT
KIT BASIN OR (CUSTOM PROCEDURE TRAY) ×4 IMPLANT
KIT ENCORE 26 ADVANTAGE (KITS) ×2 IMPLANT
KIT ROOM TURNOVER OR (KITS) ×4 IMPLANT
LIQUID BAND (GAUZE/BANDAGES/DRESSINGS) ×4 IMPLANT
NDL 18GX1X1/2 (RX/OR ONLY) (NEEDLE) IMPLANT
NDL HYPO 25GX1X1/2 BEV (NEEDLE) IMPLANT
NEEDLE 18GX1X1/2 (RX/OR ONLY) (NEEDLE) ×8 IMPLANT
NEEDLE HYPO 25GX1X1/2 BEV (NEEDLE) ×4 IMPLANT
NS IRRIG 1000ML POUR BTL (IV SOLUTION) ×4 IMPLANT
PACK CV ACCESS (CUSTOM PROCEDURE TRAY) ×4 IMPLANT
PAD ARMBOARD 7.5X6 YLW CONV (MISCELLANEOUS) ×8 IMPLANT
SET AVX THROMB ULT (MISCELLANEOUS) ×2 IMPLANT
SET MICROPUNCTURE 5F STIFF (MISCELLANEOUS) ×8 IMPLANT
SHEATH PINNACLE R/O II 6F 4CM (SHEATH) ×4 IMPLANT
SPONGE GAUZE 2X2 STER 10/PKG (GAUZE/BANDAGES/DRESSINGS) ×2
SPONGE SURGIFOAM ABS GEL 100 (HEMOSTASIS) IMPLANT
SUT MNCRL AB 4-0 PS2 18 (SUTURE) ×4 IMPLANT
SUT PROLENE 4 0 RB 1 (SUTURE) ×4
SUT PROLENE 4-0 RB1 .5 CRCL 36 (SUTURE) ×1 IMPLANT
SUT PROLENE 6 0 BV (SUTURE) ×4 IMPLANT
SUT VIC AB 3-0 SH 27 (SUTURE) ×4
SUT VIC AB 3-0 SH 27X BRD (SUTURE) ×3 IMPLANT
SYR 5ML LL (SYRINGE) ×2 IMPLANT
SYRINGE 10CC LL (SYRINGE) ×8 IMPLANT
TOWEL NATURAL 6PK STERILE (DISPOSABLE) ×2 IMPLANT
UNDERPAD 30X30 INCONTINENT (UNDERPADS AND DIAPERS) ×4 IMPLANT
WATER STERILE IRR 1000ML POUR (IV SOLUTION) ×2 IMPLANT
WIRE .035 3MM-J 145CM (WIRE) IMPLANT
WIRE BENTSON .035X145CM (WIRE) ×4 IMPLANT

## 2015-07-28 SURGICAL SUPPLY — 40 items
BAG DECANTER FOR FLEXI CONT (MISCELLANEOUS) ×2 IMPLANT
BANDAGE ACE 3X5.8 VEL STRL LF (GAUZE/BANDAGES/DRESSINGS) ×1 IMPLANT
BANDAGE ELASTIC 3 VELCRO ST LF (GAUZE/BANDAGES/DRESSINGS) IMPLANT
BANDAGE ELASTIC 4 VELCRO ST LF (GAUZE/BANDAGES/DRESSINGS) IMPLANT
BNDG GAUZE ELAST 4 BULKY (GAUZE/BANDAGES/DRESSINGS) ×1 IMPLANT
CORDS BIPOLAR (ELECTRODE) IMPLANT
CUFF TOURNIQUET SINGLE 18IN (TOURNIQUET CUFF) IMPLANT
DRAPE SURG 17X23 STRL (DRAPES) ×1 IMPLANT
ELECT REM PT RETURN 9FT ADLT (ELECTROSURGICAL)
ELECTRODE REM PT RTRN 9FT ADLT (ELECTROSURGICAL) IMPLANT
GAUZE PACKING IODOFORM 1/4X5 (PACKING) IMPLANT
GAUZE SPONGE 4X4 12PLY STRL (GAUZE/BANDAGES/DRESSINGS) ×2 IMPLANT
GAUZE XEROFORM 1X8 LF (GAUZE/BANDAGES/DRESSINGS) ×1 IMPLANT
GLOVE BIOGEL M 8.0 STRL (GLOVE) ×2 IMPLANT
GOWN STRL REUS W/ TWL LRG LVL3 (GOWN DISPOSABLE) ×2 IMPLANT
GOWN STRL REUS W/TWL LRG LVL3 (GOWN DISPOSABLE) ×4
HANDPIECE INTERPULSE COAX TIP (DISPOSABLE)
KIT BASIN OR (CUSTOM PROCEDURE TRAY) ×2 IMPLANT
KIT ROOM TURNOVER OR (KITS) ×2 IMPLANT
MANIFOLD NEPTUNE II (INSTRUMENTS) ×1 IMPLANT
NDL HYPO 25GX1X1/2 BEV (NEEDLE) IMPLANT
NEEDLE HYPO 25GX1X1/2 BEV (NEEDLE) IMPLANT
NS IRRIG 1000ML POUR BTL (IV SOLUTION) ×2 IMPLANT
PACK ORTHO EXTREMITY (CUSTOM PROCEDURE TRAY) ×2 IMPLANT
PACKING 1/4 INCH ×1 IMPLANT
PAD ARMBOARD 7.5X6 YLW CONV (MISCELLANEOUS) ×3 IMPLANT
PAD CAST 4YDX4 CTTN HI CHSV (CAST SUPPLIES) IMPLANT
PADDING CAST COTTON 4X4 STRL (CAST SUPPLIES)
SET HNDPC FAN SPRY TIP SCT (DISPOSABLE) IMPLANT
SOAP 2 % CHG 4 OZ (WOUND CARE) ×1 IMPLANT
SPONGE GAUZE 4X4 12PLY STER LF (GAUZE/BANDAGES/DRESSINGS) ×1 IMPLANT
SPONGE LAP 18X18 X RAY DECT (DISPOSABLE) IMPLANT
SPONGE LAP 4X18 X RAY DECT (DISPOSABLE) ×2 IMPLANT
SYR CONTROL 10ML LL (SYRINGE) IMPLANT
TOWEL OR 17X24 6PK STRL BLUE (TOWEL DISPOSABLE) ×2 IMPLANT
TOWEL OR 17X26 10 PK STRL BLUE (TOWEL DISPOSABLE) ×2 IMPLANT
TUBE ANAEROBIC SPECIMEN COL (MISCELLANEOUS) IMPLANT
TUBE CONNECTING 12X1/4 (SUCTIONS) ×2 IMPLANT
WATER STERILE IRR 1000ML POUR (IV SOLUTION) ×1 IMPLANT
YANKAUER SUCT BULB TIP NO VENT (SUCTIONS) ×2 IMPLANT

## 2015-07-28 NOTE — H&P (Signed)
Brief History and Physical  History of Present Illness  [name] is a 40 y.o. female who presents with chief complaint: thrombosed RUA AVG.  Pt notes the RUA AVG clotted ~one weeks ago.  The patient presents today for percutaneous thrombectomy of RUA AVG vs open thrombectomy and revision RUA AVG.    Past Medical History:  Diagnosis Date  . Anemia   . Anxiety    pt not aware of this  . Arthritis   . CHF (congestive heart failure) ([place])    hosp. 2011  . Chronic kidney disease    not on dialysis at this time  . Dilated cardiomyopathy ([place])   . Encounter for blood transfusion   . Foot fracture   . Gout   . Heart murmur    as a child  . History of blood transfusion   . History of pneumonia   . Hyperlipidemia   . Hypertension   . Menorrhagia   . Morbid obesity ([place])   . Pneumonia    hosp. 2010  . Shortness of breath    seldom  . Thyroid disease     Past Surgical History:  Procedure Laterality Date  . AV FISTULA PLACEMENT  08/04/2011   Procedure: ARTERIOVENOUS (AV) FISTULA CREATION;  Surgeon: Elam Dutch, MD;  Location[facility] OR;  Service: Vascular;  Laterality: Right;  Creation right brachiocephalic arteriovenous fistula  . AV FISTULA PLACEMENT Left 02/05/2013   Procedure: ARTERIOVENOUS FISTULA CREATION LEFT ARM;  Surgeon: Conrad El Cenizo, MD;  Location: [place];  Service: Vascular;  Laterality: Left;  . AV FISTULA PLACEMENT Right 06/04/2013   Procedure: ARTERIOVENOUS (AV) FISTULA CREATION;  Surgeon: Conrad [place], MD;  Location: [place];  Service: Vascular;  Laterality: Right;  . AV FISTULA PLACEMENT Right 04/15/2014   Procedure: INSERTION OF ARTERIOVENOUS (AV) GORE-TEX GRAFT ARM;  Surgeon: Conrad Gravette, MD;  Location: [place];  Service: Vascular;  Laterality: Right;  . BASCILIC VEIN TRANSPOSITION Right 08/13/2013   Procedure: Second Stage Brachial Vein Transposition;  Surgeon: Conrad B[place]ree, MD;  Location: [place];  Service: Vascular;  Laterality: Right;  . CARDIAC SURGERY   1979   repair of hole in heart  . LIGATION OF ARTERIOVENOUS  FISTULA Left 03/05/2013   Procedure: LIGATION OF ARTERIOVENOUS  FISTULA;  Surgeon: Conrad Glasgow, MD;  Location: [place];  Service: Vascular;  Laterality: Left;  . PATCH ANGIOPLASTY Left 03/05/2013   Procedure: PATCH ANGIOPLASTY;  Surgeon: Conrad Leshara, MD;  Location: [place];  Service: Vascular;  Laterality: Left;  . PERIPHERAL VASCULAR CATHETERIZATION Right 10/31/2014   Procedure: Fistulagram;  Surgeon: Conrad Linton Hall, MD;  Location: [place] CV LAB;  Service: Cardiovascular;  Laterality: Right;  . PERIPHERAL VASCULAR CATHETERIZATION Right 10/31/2014   Procedure: Peripheral Vascular Balloon Angioplasty;  Surgeon: Conrad Henning, MD;  Location: [place] CV LAB;  Service: Cardiovascular;  Laterality: Right;  pta venous rt arm  . PERIPHERAL VASCULAR CATHETERIZATION N/A 03/13/2015   Procedure: A/V Shuntogram;  Surgeon: Conrad [place], MD;  Location: [place] CV LAB;  Service: Cardiovascular;  Laterality: N/A;  . PERIPHERAL VASCULAR CATHETERIZATION Right 03/13/2015   Procedure: Peripheral Vascular Balloon Angioplasty;  Surgeon: Conrad [place], MD;  Location: [place] CV LAB;  Service: Cardiovascular;  Laterality: Right;  Shunt  . THROMBECTOMY W/ EMBOLECTOMY Left 03/05/2013   Procedure: THROMBECTOMY OF LEFT BRACHIAL ARTERY;  Surgeon: Conrad Wagram, MD;  Location: [place];  Service: Vascular;  Laterality:  Left;  . VENOGRAM Bilateral 03/25/2014   Procedure: VENOGRAM bilateral;  Surgeon: Conrad [place], MD;  Location: [facility] CATH LAB;  Service: Cardiovascular;  Laterality: Bilateral;    Social History   Social History  . Marital status: Married    Spouse name: N/A  . Number of children: N/A  . Years of education: N/A   Occupational History  . Not on file.   Social History Main Topics  . Smoking status: Never Smoker  . Smokeless tobacco: Never Used  . Alcohol use No  . Drug use: No  . Sexual activity: Yes    Birth control/ protection: None      Comment: no birth control    Other Topics Concern  . Not on file   Social History Narrative  . No narrative on file    Family History  Problem Relation Age of Onset  . Diabetes Mother   . Hyperlipidemia Mother   . Hypertension Mother   . Kidney disease Mother   . Anesthesia problems Neg Hx   . Hypotension Neg Hx   . Malignant hyperthermia Neg Hx   . Pseudochol deficiency Neg Hx     Current Facility-Administered Medications on File Prior to Encounter  Medication Dose Route Frequency Provider Last Rate Last Dose  . 0.9 %  sodium chloride infusion   Intravenous Continuous Conrad Elgin, MD       Current Outpatient Prescriptions on File Prior to Encounter  Medication Sig Dispense Refill  . allopurinol (ZYLOPRIM) 100 MG tablet Take 200 mg by mouth daily.     . carvedilol (COREG) 25 MG tablet Take 25 mg by mouth 2 (two) times daily with a meal.    . ferrous sulfate 325 (65 FE) MG EC tablet Take 1 tablet (325 mg total) by mouth 2 (two) times daily. (Patient taking differently: Take 650 mg by mouth daily with breakfast. ) 60 tablet 1  . furosemide (LASIX) 80 MG tablet Take 80 mg by mouth 2 (two) times daily.    . hydrALAZINE (APRESOLINE) 100 MG tablet Take 100 mg by mouth 3 (three) times daily.     . megestrol (MEGACE) 20 MG tablet Take 1 tablet (20 mg total) by mouth 2 (two) times daily. 60 tablet 12  . pravastatin (PRAVACHOL) 40 MG tablet Take 40 mg by mouth daily.     Marland Kitchen RENVELA 800 MG tablet Take 1,600 mg by mouth 3 (three) times daily with meals. And snacks      Allergies  Allergen Reactions  . No Known Allergies     Review of Systems: As listed above, otherwise negative.  Physical Examination  There were no vitals filed for this visit.  General: A&O x 3, WDWN  Pulmonary: Sym exp, good air movt, CTAB, no rales, rhonchi, & wheezing  Cardiac: RRR, Nl S1, S2, no Murmurs, rubs or gallops  Gastrointestinal: soft, NTND, -G/R, - HSM, - masses, - CVAT B  Musculoskeletal:  M/S 5/5 throughout , Extremities without ischemic changes , RUA AVG without thrill or bruit  Laboratory See iStat  Medical Decision Making  [name] is a 40 y.o. female who presents with: thrombosed RUA AVG.   The patient is scheduled for: percutaneous thrombectomy vs open thrombectomy and revision of RUA AVG  I discussed with the patient the nature of angiographic procedures, especially the limited patencies of any endovascular intervention.  The patient is aware of that the risks of an angiographic procedure include but are not limited to: bleeding,  infection, access site complications, renal failure, embolization, rupture of vessel, dissection, possible need for emergent surgical intervention, possible need for surgical procedures to treat the patient's pathology, and stroke and death.    Risk, benefits, and alternatives to access surgery were discussed.  The patient is aware the risks include but are not limited to: bleeding, infection, steal syndrome, nerve damage, ischemic monomelic neuropathy, failure to mature, and need for additional procedures.  The patient is aware of the risks and agrees to proceed.  Adele Barthel, MD [facility] Office: [phone] Pager: [phone]  07/28/2015, 9:25 AM

## 2015-07-28 NOTE — Anesthesia Preprocedure Evaluation (Signed)
Anesthesia Evaluation  Patient identified by MRN, date of birth, ID band Patient awake  General Assessment Comment:Chart reviewed. Patient examined. Case to be performed under local anesthesia. Patient and Dr. Lenon Curt in agreement. CE  Reviewed: Allergy & Precautions, NPO status , Patient's Chart, lab work & pertinent test results  Airway Mallampati: II  TM Distance: >3 FB Neck ROM: Full    Dental   Pulmonary shortness of breath, pneumonia,    breath sounds clear to auscultation       Cardiovascular hypertension, + Peripheral Vascular Disease and +CHF   Rhythm:Regular Rate:Normal     Neuro/Psych    GI/Hepatic negative GI ROS, Neg liver ROS,   Endo/Other    Renal/GU Renal disease     Musculoskeletal  (+) Arthritis ,   Abdominal   Peds  Hematology  (+) anemia ,   Anesthesia Other Findings   Reproductive/Obstetrics                             Anesthesia Physical Anesthesia Plan  ASA: III and emergent  Anesthesia Plan: MAC   Post-op Pain Management:    Induction:   Airway Management Planned: Nasal Cannula  Additional Equipment:   Intra-op Plan:   Post-operative Plan:   Informed Consent: I have reviewed the patients History and Physical, chart, labs and discussed the procedure including the risks, benefits and alternatives for the proposed anesthesia with the patient or authorized representative who has indicated his/her understanding and acceptance.   Dental advisory given  Plan Discussed with: CRNA, Anesthesiologist and Surgeon  Anesthesia Plan Comments:         Anesthesia Quick Evaluation

## 2015-07-28 NOTE — ED Provider Notes (Signed)
[facility] DEPT Provider Note   CSN: [id number] Arrival date & time: 07/28/15  1603  First Provider Contact:  First MD Initiated Contact with Patient 07/28/15 2000        History   Chief Complaint Chief Complaint  Patient presents with  . Animal Bite  . Arm Swelling    right hand    HPI [name] is a 40 y.o. female.  HPI This is a 40 year old female who presents today complaining of pain and swelling to the thenar eminence of her right hand after a dog bite on Saturday night. It was her dog and it was in a fight with another dog. She states that the hand was just scraped. She has renal insufficiency and had a graft declotted in her right upper extremity today. She was told to come to the ED. Past Medical History:  Diagnosis Date  . Anemia   . Anxiety    pt not aware of this  . Arthritis   . CHF (congestive heart failure) ([place])    hosp. 2011  . Chronic kidney disease    not on dialysis at this time  . Dilated cardiomyopathy ([place])   . Encounter for blood transfusion   . Foot fracture   . Gout   . Heart murmur    as a child  . History of blood transfusion   . History of pneumonia   . Hyperlipidemia   . Hypertension   . Menorrhagia   . Morbid obesity ([place])   . Pneumonia    hosp. 2010  . Shortness of breath    seldom  . Thyroid disease     Patient Active Problem List   Diagnosis Date Noted  . Itching-Right upper arm 05/17/2014  . Leucocytosis 02/28/2014  . Clotted dialysis access [facility])   . Symptomatic anemia 02/27/2014  . Severe anemia 02/27/2014  . Menorrhagia with irregular cycle 02/27/2014  . Essential hypertension 02/27/2014  . 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 Leukocytosis, unspecified 01/28/2013  . Anemia 01/27/2013  . CKD (chronic kidney disease) stage 5, GFR less than 15 ml/min (H[facility]) 01/27/2013  . Syncope 01/27/2013  . Menorrhagia 01/27/2013  . HYPERLIPIDEMIA-MIXED 04/20/2008  . OBESITY-MORBID (>100')  04/20/2008  . MITRAL REGURGITATION 04/20/2008  . HYPERTENSION, UNSPECIFIED 04/20/2008  . CARDIOMYOPATHY, DILATED 04/20/2008  . SYSTOLIC HEART FAILURE, ACUTE 04/20/2008  . CHRONIC KIDNEY DISEASE STAGE III (MODERATE) 04/20/2008    Past Surgical History:  Procedure Laterality Date  . AV FISTULA PLACEMENT  08/04/2011   Procedure: ARTERIOVENOUS (AV) FISTULA CREATION;  Surgeon: Elam Dutch, MD;  Location: [facility] OR;  Service: Vascular;  Laterality: Right;  Creation right brachiocephalic arteriovenous fistula  . AV FISTULA PLACEMENT Left 02/05/2013   Procedure: ARTERIOVENOUS FISTULA CREATION LEFT ARM;  Surgeon: Conrad [place], MD;  Location: [place];  Service: Vascular;  Laterality: Left;  . AV FISTULA PLACEMENT Right 06/04/2013   Procedure: ARTERIOVENOUS (AV) FISTULA CREATION;  Surgeon: Conrad Ozark, MD;  Location: [place];  Service: Vascular;  Laterality: Right;  . AV FISTULA PLACEMENT Right 04/15/2014   Procedure: INSERTION OF ARTERIOVENOUS (AV) GORE-TEX GRAFT ARM;  Surgeon: Conrad [place], MD;  Location: [place];  Service: Vascular;  Laterality: Right;  . BASCILIC VEIN TRANSPOSITION Right 08/13/2013   Procedure: Second Stage Brachial Vein Transposition;  Surgeon: Conrad [place], MD;  Location: [place];  Service: Vascular;  Laterality: Right;  . CARDIAC SURGERY  1979   repair of hole in heart  .  LIGATION OF ARTERIOVENOUS  FISTULA Left 03/05/2013   Procedure: LIGATION OF ARTERIOVENOUS  FISTULA;  Surgeon: Conrad [place], MD;  Location: [place];  Service: Vascular;  Laterality: Left;  . PATCH ANGIOPLASTY Left 03/05/2013   Procedure: PATCH ANGIOPLASTY;  Surgeon: Conrad Ross, MD;  Location: [place];  Service: Vascular;  Laterality: Left;  . PERIPHERAL VASCULAR CATHETERIZATION Right 10/31/2014   Procedure: Fistulagram;  Surgeon: Conrad Rose, MD;  Location: [place] [facility] LAB;  Service: Cardiovascular;  Laterality: Right;  . PERIPHERAL VASCULAR CATHETERIZATION Right 10/31/2014   Procedure: Peripheral Vascular Balloon  Angioplasty;  Surgeon: Conrad Watchung, MD;  Location: [place] [facility] LAB;  Service: Cardiovascular;  Laterality: Right;  pta venous rt arm  . PERIPHERAL VASCULAR CATHETERIZATION N/A 03/13/2015   Procedure: A/V Shuntogram;  Surgeon: Conrad [place], MD;  Location: [place] [facility] LAB;  Service: Cardiovascular;  Laterality: N/A;  . PERIPHERAL VASCULAR CATHETERIZATION Right 03/13/2015   Procedure: Peripheral Vascular Balloon Angioplasty;  Surgeon: Conrad Harrah, MD;  Location: [place] [facility];  Service: Cardiovascular;  Laterality: Right;  Shunt  . THROMBECTOMY W/ EMBOLECTOMY Left 03/05/2013   Procedure: THROMBECTOMY OF LEFT BRACHIAL ARTERY;  Surgeon: Conrad [place], MD;  Location: [place];  Service: Vascular;  Laterality: Left;  Marland Kitchen VENOGRAM Bilateral 03/25/2014   Procedure: VENOGRAM bilateral;  Surgeon: Conrad [place], MD;  Location: [facility] CATH LAB;  Service: Cardiovascular;  Laterality: Bilateral;    OB History    No data available       Home Medications    Prior to Admission medications   Medication Sig Start Date End Date Taking? Authorizing Provider  allopurinol (ZYLOPRIM) 100 MG tablet Take 200 mg by mouth daily.     Historical Provider, MD  carvedilol (COREG) 25 MG tablet Take 25 mg by mouth 2 (two) times daily with a meal.    Historical Provider, MD  ferrous sulfate 325 (65 FE) MG EC tablet Take 1 tablet (325 mg total) by mouth 2 (two) times daily. Patient taking differently: Take 650 mg by mouth daily with breakfast.  04/25/13   Emily Filbert, MD  furosemide (LASIX) 80 MG tablet Take 80 mg by mouth 2 (two) times daily.    Historical Provider, MD  hydrALAZINE (APRESOLINE) 100 MG tablet Take 100 mg by mouth 3 (three) times daily.  05/30/13   Historical Provider, MD  megestrol (MEGACE) 20 MG tablet Take 1 tablet (20 mg total) by mouth 2 (two) times daily. 05/30/15   Peggy Constant, MD  pravastatin (PRAVACHOL) 40 MG tablet Take 40 mg by mouth daily.  06/01/13   Historical Provider, MD  RENVELA 800 MG tablet Take  1,600 mg by mouth 3 (three) times daily with meals. And snacks 09/04/13   Historical Provider, MD    Family History Family History  Problem Relation Age of Onset  . Diabetes Mother   . Hyperlipidemia Mother   . Hypertension Mother   . Kidney disease Mother   . Anesthesia problems Neg Hx   . Hypotension Neg Hx   . Malignant hyperthermia Neg Hx   . Pseudochol deficiency Neg Hx     Social History Social History  Substance Use Topics  . Smoking status: Never Smoker  . Smokeless tobacco: Never Used  . Alcohol use No     Allergies   No known allergies   Review of Systems Review of Systems   Physical Exam Updated Vital Signs BP 158/83 (BP Location: Left Wrist)  Pulse (!) 56   Temp 98.1 F (36.7 C) (Oral)   Resp 20   SpO2 97%   Physical Exam  Constitutional: She appears well-developed and well-nourished. No distress.  HENT:  Head: Normocephalic and atraumatic.  Eyes: EOM are normal. Pupils are equal, round, and reactive to light.  Neck: Normal range of motion.  Cardiovascular: Normal rate.   Pulmonary/Chest: Effort normal.  Musculoskeletal:       Hands: Swelling   Nursing note and vitals reviewed.    ED Treatments / Results  Labs (all labs ordered are listed, but only abnormal results are displayed) Labs Reviewed - No data to display  EKG  EKG Interpretation None       Radiology Dg Hand Complete Right  Result Date: 07/28/2015 CLINICAL DATA:  Recent dog bite at base of thumb with hand swelling, initial encounter EXAM: RIGHT HAND - COMPLETE 3+ VIEW COMPARISON:  06/06/2010 FINDINGS: No acute fracture or dislocation is noted. No acute bony abnormality is seen. Diffuse soft tissue swelling is noted consistent with the patient's given clinical history. No radiopaque foreign body is seen. IMPRESSION: Diffuse soft tissue swelling without acute bony abnormality. Electronically Signed   By: Inez Catalina M.D.   On: 07/28/2015 17:34   Procedures Procedures  (including critical care time)  Medications Ordered in ED Medications  morphine 2 MG/ML injection 2 mg (2 mg Intravenous Given 07/28/15 2034)     Initial Impression / Assessment and Plan / ED Course  I have reviewed the triage vital signs and the nursing notes.  Pertinent labs & imaging results that were available during my care of the patient were reviewed by me and considered in my medical decision making (see chart for details).  Clinical Course    40 year old female with infected right hand after dog bite. Patient discussed with Dr. Lenon Curt and he is in route to take patient to the operating room. She has labs pending and is given 1.5 g of Unasyn here in the ED.  Final Clinical Impressions(s) / ED Diagnoses   Final diagnoses:  Infection of right hand due to bite, initial encounter    New Prescriptions New Prescriptions   No medications on file     Pattricia Boss, MD 07/28/15 2057

## 2015-07-28 NOTE — ED Triage Notes (Signed)
Pt just finished a procedure in the hospital and wanted her right hand checked out. Pt c/o right had swelling from a dog bite Saturday night by her own dog. Shots up to date on dog. Pt presents with small puncture wound to right hand. Pt states her hand is swollen and painful.

## 2015-07-28 NOTE — Anesthesia Preprocedure Evaluation (Addendum)
Anesthesia Evaluation  Patient identified by MRN, date of birth, ID band Patient awake    Reviewed: Allergy & Precautions, NPO status , Patient's Chart, lab work & pertinent test results, reviewed documented beta blocker date and time   Airway Mallampati: II  TM Distance: >3 FB Neck ROM: Full    Dental  (+) Teeth Intact, Dental Advisory Given   Pulmonary    breath sounds clear to auscultation       Cardiovascular hypertension, Pt. on home beta blockers and Pt. on medications + Peripheral Vascular Disease and +CHF   Rhythm:Regular Rate:Normal     Neuro/Psych PSYCHIATRIC DISORDERS Anxiety negative neurological ROS     GI/Hepatic negative GI ROS, Neg liver ROS,   Endo/Other  negative endocrine ROS  Renal/GU CRFRenal disease  negative genitourinary   Musculoskeletal  (+) Arthritis ,   Abdominal   Peds negative pediatric ROS (+)  Hematology negative hematology ROS (+)   Anesthesia Other Findings   Reproductive/Obstetrics negative OB ROS                            Lab Results  Component Value Date   WBC 13.9 (H) 03/01/2014   HGB 10.0 (L) 07/23/2015   HCT 36.0 03/13/2015   MCV 90.3 03/01/2014   PLT 182 03/01/2014   Lab Results  Component Value Date   CREATININE 6.90 (H) 03/13/2015   BUN 60 (H) 03/13/2015   NA 144 03/13/2015   K 3.9 03/13/2015   CL 108 03/13/2015   CO2 21 03/01/2014   Lab Results  Component Value Date   INR 1.21 02/28/2014   INR 1.19 01/28/2013   INR 1.03 11/27/2010     Anesthesia Physical Anesthesia Plan  ASA: III  Anesthesia Plan: MAC   Post-op Pain Management:    Induction: Intravenous  Airway Management Planned: Natural Airway  Additional Equipment:   Intra-op Plan:   Post-operative Plan:   Informed Consent: I have reviewed the patients History and Physical, chart, labs and discussed the procedure including the risks, benefits and  alternatives for the proposed anesthesia with the patient or authorized representative who has indicated his/her understanding and acceptance.     Plan Discussed with: CRNA  Anesthesia Plan Comments:       Anesthesia Quick Evaluation

## 2015-07-28 NOTE — Anesthesia Procedure Notes (Signed)
Procedure Name: MAC Date/Time: 07/28/2015 12:45 PM Performed by: Lowella Dell Pre-anesthesia Checklist: Patient identified, Emergency Drugs available, Suction available, Patient being monitored and Timeout performed Patient Re-evaluated:Patient Re-evaluated prior to inductionOxygen Delivery Method: Simple face mask Intubation Type: IV induction Dental Injury: Teeth and Oropharynx as per pre-operative assessment

## 2015-07-28 NOTE — Transfer of Care (Signed)
Immediate Anesthesia Transfer of Care Note  Patient: [name]  Procedure(s) Performed: Procedure(s): IRRIGATION AND DEBRIDEMENT WOUND (Right)  Patient Location: PACU  Anesthesia Type:MAC  Level of Consciousness: awake, alert  and oriented  Airway & Oxygen Therapy: Patient Spontanous Breathing  Post-op Assessment: Report given to RN and Post -op Vital signs reviewed and stable  Post vital signs: Reviewed and stable  Last Vitals:  Vitals:   07/28/15 1612 07/28/15 2022  BP: 170/92 158/83  Pulse: 68 (!) 56  Resp: 18 20  Temp: 36.7 C 36.7 C    Last Pain:  Vitals:   07/28/15 2024  TempSrc:   PainSc: 9          Complications: No apparent anesthesia complications

## 2015-07-28 NOTE — Progress Notes (Signed)
Patient will be going to ED after discharge from PACU. IV to L hand CDI. Jessica CN at ED made aware of IV to L hand.

## 2015-07-28 NOTE — Transfer of Care (Signed)
Immediate Anesthesia Transfer of Care Note  Patient: [name]  Procedure(s) Performed: Procedure(s): RHEOLGTIC THROMBECTOMY of RIGHT UPPER arm ARTERIOVENOUS GORE-TEX GRAFT (Right) ANGIOPLASTY OF RIGHT BRACHIAL VEIN RIGHT ARM SHUNTOGRAM  Patient Location: PACU  Anesthesia Type:MAC  Level of Consciousness: awake, alert , oriented and patient cooperative  Airway & Oxygen Therapy: Patient Spontanous Breathing  Post-op Assessment: Report given to RN, Post -op Vital signs reviewed and stable and Patient moving all extremities  Post vital signs: Reviewed and stable  Last Vitals:  Vitals:   07/28/15 1054 07/28/15 1500  BP: (!) 147/67 (!) 154/84  Pulse: 73 91  Resp: 20 18  Temp: 36.9 C 36.9 C    Last Pain:  Vitals:   07/28/15 1500  TempSrc:   PainSc: 0-No pain      Patients Stated Pain Goal: 3 ([id number])  Complications: No apparent anesthesia complications

## 2015-07-28 NOTE — ED Notes (Signed)
No answer x1

## 2015-07-28 NOTE — Op Note (Signed)
OPERATIVE NOTE   PROCEDURE: 1. Ultrasound guidance cannulation of right upper arm arteriovenous graft 2. Right arm shuntogram 3. Rheolytic thrombectomy of right upper arm arteriovenous graft  4. Angioplasty right brachial vein x 2 (6 mm x 80 mm, 7 mm x 40 mm) 5. Intra-graft administration of tPA  PRE-OPERATIVE DIAGNOSIS: thrombosed right upper arm arteriovenous graft   POST-OPERATIVE DIAGNOSIS: same as above   SURGEON: Adele Barthel, MD  ANESTHESIA: local and MAC  ESTIMATED BLOOD LOSS: 100 cc  FINDING(S): 1.  Recurrent stenosis at venous anastomosis leading to occlusion: <30% residual stenosis at end of the case. 2.  Anastomosis to radial vs ulnar artery 3.  Patent distal brachial artery with bifurcation into radial and ulnar arteries 4.  Dopplerable right radial and ulnar artery signals 5.  Waveforms in proximal arteriovenous graft consistent with widely patent arteriovenous graft   SPECIMEN(S):  None  CONTRAST: 20 cc  INDICATIONS:   [name] is a 40 y.o. female who presents with occluded right upper arm arteriovenous graft for 1 week.  I recommended: percutaneous vs open thrombectomy of right upper arm arteriovenous graft.  I discussed with the patient the nature of angiographic procedures, especially the limited patencies of any endovascular intervention.  The patient is aware of that the risks of an angiographic procedure include but are not limited to: bleeding, infection, access site complications, renal failure, embolization, rupture of vessel, dissection, arteriovenous fistula, possible need for emergent surgical intervention, possible need for surgical procedures to treat the patient's pathology, anaphylactic reaction to contrast, and stroke and death.  The patient is aware of the risks and agrees to proceed.   DESCRIPTION: After obtaining full informed written consent, the patient was brought back to the operating room and placed supine upon the operating  table.  The patient received IV antibiotics prior to induction.  After obtaining adequate anesthesia, the patient was prepped and draped in the standard fashion for: right arm access procedure.  I turned my attention to the distal arteriovenous graft.  There was no thrill or bruit in this graft.  Under ultrasound guidance, the right upper arm graft was cannulated near the arterial anastomosis with a micropuncture needle, directed toward the venous anastomosis.  I passed a microwire into the graft.  The wire was exchanged for a microsheath which loaded into the graft.  I passed a Bentson wire through the sheath, the wire curled up at the venous anastomosis.  I exchanged the wire for a glidewire, I was able to traverse the venous anastomosis.  I exchanged the sheath for 6-Fr sheath.  I loaded an end-hole catheter over the wire and did a hand injection to very re-entry into a patent central venous system.  I administer a total of 6 mg of tPA sequentially in the arteriovenous graft from the venous anastomosis to the sheath.   After waiting 5 minutes, I loaded a Angiojet AVX catheter over the wire and performed two passes of rheolytic thrombectomy on the graft.  Hand injection demonstrated successful lysis and thrombectomy of the venous outflow.  A recurrent stenosis at the venous anastomosis was visualized.  I loaded a 6 mm x 80 mm balloon over the wire and balloon the graft and the outflow at 18 atm for 2 minutes throughout this graft and venous anastomosis.  There appeared to be residual stenosis so I loaded a 7 mm x 40 mm balloon, centered on the venous anastomosis.  I inflated it once at 18 atm for 2  minutes and then 20 atm for 2 minutes.  This resulted in <30% residual stenosis.    At this point, I cannulated the arteriovenous graft with some difficulty at the apex of the graft's curvature with a micropuncture needle.  I could not get the microwire to track into the brachial artery.  I removed the needle and held  pressure.  I repeated this 2 more times, and each time the microwire would not track distally.  I eventually cannulated the graft with a 18 gauge needle, oriented toward the arterial anastomosis, and then loaded a Bentson wire into the brachial artery.  I exchanged the needle for a 6-Fr sheath.  I did hand injections to very successful intra-graft placement and continued arterial anastomosis occlusion.  After several passes of the 3-Fogarty balloon, I felt I likely had recannulated the arterial anastomosis, so I replaced a Bentson wire through the arterial oriented sheath and directed the wire into the brachial artery.  I loaded the AVX catheter and performed one pass of rheolytic thrombectomy in the distal arteriovenous graft.  I did a completion angiogram which demonstrated a widely patent arterial anastomosis and patent brachial artery with bifurcation into radial and ulnar arteries.    At this point, I did one final completion injection which demonstrated a widely patent arteriovenous graft with <30% residual stenosis at the venous anastomosis.  Continuous doppler was used to interrogate the arteriovenous graft at the end of the case.  The findings are listed above.   COMPLICATIONS: none  CONDITION: stable   Adele Barthel, MD [facility] Office: [phone] Pager: [phone]  07/28/2015, 2:50 PM

## 2015-07-28 NOTE — ED Notes (Signed)
Pt undressed, belongings remain with this RN, pt to OR, report given to anesthesia.

## 2015-07-29 ENCOUNTER — Encounter (HOSPITAL_COMMUNITY): Payer: Self-pay | Admitting: Vascular Surgery

## 2015-07-29 NOTE — Anesthesia Postprocedure Evaluation (Signed)
Anesthesia Post Note  Patient: [name]  Procedure(s) Performed: Procedure(s) (LRB): RHEOLGTIC THROMBECTOMY of RIGHT UPPER arm ARTERIOVENOUS GORE-TEX GRAFT (Right) ANGIOPLASTY OF RIGHT BRACHIAL VEIN RIGHT ARM SHUNTOGRAM  Patient location during evaluation: PACU Anesthesia Type: MAC Level of consciousness: awake and alert Pain management: pain level controlled Vital Signs Assessment: post-procedure vital signs reviewed and stable Respiratory status: spontaneous breathing, nonlabored ventilation, respiratory function stable and patient connected to nasal cannula oxygen Cardiovascular status: stable and blood pressure returned to baseline Anesthetic complications: no    Last Vitals:  Vitals:   07/28/15 1530 07/28/15 1535  BP:  (!) 144/82  Pulse: 72 70  Resp: 16 16  Temp:      Last Pain:  Vitals:   07/28/15 1530  TempSrc:   PainSc: [place]

## 2015-07-29 NOTE — Op Note (Signed)
[name], [name]             ACCOUNT NO.:  [id number]  MEDICAL RECORD NO.:  [id number]  LOCATION:  [facility]                         FACILITY:  [facility]  PHYSICIAN:  Dennie Bible, MD    DATE OF BIRTH:  January 02, 1976  DATE OF PROCEDURE:  07/28/2015 DATE OF DISCHARGE:  07/28/2015                              OPERATIVE REPORT   PREOPERATIVE DIAGNOSIS:  Cellulitis abscess of the right hand.  POSTOPERATIVE DIAGNOSIS:  Cellulitis abscess of the right hand.  PROCEDURE:  Incision and drainage of abscess of the right hand thenar eminence.  ANESTHESIA:  Local.  COMPLICATIONS:  No acute complications.  SPECIMENS:  No specimens.  INDICATIONS:  Ms. [name] is a 40 year old female was bit by her dog on Saturday.  Her hand has become more intensely swollen, painful with some drainage from a puncture wound on the thenar eminence of the right hand. She was seen today by the Vascular Service for a revision graft, however, this was not addressed.  Upon discharge from this procedure, the hand was again evaluated by our nursing staff, and she was seen in the emergency room for evaluation.  On exam, she had evidence of abscess formation with tense thenar eminence, no evidence of tendon involvement. Risks, benefits, and alternatives of surgical I and D were discussed with the patient.  Of note, the patient recently ate and drank, therefore the Anesthesia Service was reluctant to provide sedation to the patient.  So a decision was made to do this under straight local.  DESCRIPTION OF PROCEDURE:  The patient was taken to the operating room. She remained on the stretcher.  The right hand was prepped with Betadine solution.  A combination mixture of 2% plain lidocaine and 0.25% plain Marcaine were infiltrated around the bite wound.  This was allowed to sit for a short period of time.  Next, a cruciate incision was made over the scab.  Immediately upon doing this, there was some purulent material that  was expressed.  Further I and D deep into the tissues was performed.  The wound was squeezed and all purulent material was removed.  The wound was then thoroughly irrigated with irrigation solution.  The wound was then left open and packed gently with quarter-inch iodoform gauze.  Sterile dressing was applied.  The patient was given specific instructions to remove the packing in the morning and start irrigating the wound out several times a day and keep covered.  I gave her a prescription of Augmentin to take for 7 days.     Dennie Bible, MD     HCC/MEDQ  D:  07/28/2015  T:  07/29/2015  Job:  [id number]

## 2015-07-29 NOTE — Anesthesia Postprocedure Evaluation (Signed)
Anesthesia Post Note  Patient: [name]  Procedure(s) Performed: Procedure(s) (LRB): IRRIGATION AND DEBRIDEMENT RIGHT HAND  WOUND (Right)  Patient location during evaluation: PACU Anesthesia Type: MAC Level of consciousness: awake Pain management: pain level controlled Respiratory status: spontaneous breathing Cardiovascular status: stable Anesthetic complications: no    Last Vitals:  Vitals:   07/28/15 2220 07/28/15 2240  BP: (!) 154/84 (!) 170/93  Pulse:  67  Resp:  19  Temp:  36.1 C    Last Pain:  Vitals:   07/28/15 2240  TempSrc:   PainSc: Asleep                 EDWARDS,Unnamed Zeien

## 2015-07-30 ENCOUNTER — Telehealth: Payer: Self-pay

## 2015-07-30 NOTE — Telephone Encounter (Signed)
Phone call to pt. In follow up to request for specific limitations she will be under when she returns to work on 7/28.  The pt. 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  Reported she thinks the boxes weigh approx. 10 lbs, but she isn't sure. Stated her employer is requiring she have clear guidelines of her limitations, instead of returning to work on "light duty."  Advised will discuss with Dr. Bridgett Larsson, and return call to pt. With information/ recommendations.  Verb. Understanding.

## 2015-07-30 NOTE — Telephone Encounter (Signed)
-----   Message from Donzetta Matters sent at 07/29/2015  4:32 PM EDT ----- Regarding: FW: Detailed Light Duty Work Note Contact: [phone]   ----- Message ----- From: Donzetta Matters Sent: 07/29/2015   4:14 PM To: [facility] Admin Pool Subject: Detailed Light Duty Work Note                  Pt came in today 7/25 to get a detailed note on her limitations for light duty. Dr. Bridgett Larsson wrote a note for light duty but her job needs more specifics on what she can and cant do with her arm. I told the patient that I will mention this to the nurses and they can give her a more detailed note. She said just give her a call for specifics if needed and she can come pick it up. She is supposed to go back to work on Friday.  Thanks :-)

## 2015-07-31 ENCOUNTER — Other Ambulatory Visit: Payer: Self-pay | Admitting: Internal Medicine

## 2015-07-31 DIAGNOSIS — N189 Chronic kidney disease, unspecified: Principal | ICD-10-CM

## 2015-07-31 DIAGNOSIS — D631 Anemia in chronic kidney disease: Secondary | ICD-10-CM | POA: Insufficient documentation

## 2015-07-31 NOTE — Telephone Encounter (Signed)
Message  Received: [place], MD  Joline Salt Bertran Zeimet, RN        No lifting with right arm >15 lb for 2 weeks    Attempted to contact pt. Per phone re: above lifting restrictions when she returns to work.  Left voice message that a note, detailing the restrictions, will be created and available for pick-up at the office, to take to her employer.

## 2015-08-20 ENCOUNTER — Encounter (HOSPITAL_COMMUNITY)
Admission: RE | Admit: 2015-08-20 | Discharge: 2015-08-20 | Disposition: A | Discharge status: Home / Self Care | Payer: BLUE CROSS/BLUE SHIELD | Source: Ambulatory Visit | Attending: Nephrology | Admitting: Nephrology

## 2015-08-20 DIAGNOSIS — N185 Chronic kidney disease, stage 5: Secondary | ICD-10-CM | POA: Diagnosis not present

## 2015-08-20 DIAGNOSIS — Z5181 Encounter for therapeutic drug level monitoring: Secondary | ICD-10-CM | POA: Insufficient documentation

## 2015-08-20 DIAGNOSIS — Z79899 Other long term (current) drug therapy: Secondary | ICD-10-CM | POA: Diagnosis not present

## 2015-08-20 DIAGNOSIS — D631 Anemia in chronic kidney disease: Secondary | ICD-10-CM | POA: Diagnosis not present

## 2015-08-20 DIAGNOSIS — N189 Chronic kidney disease, unspecified: Secondary | ICD-10-CM

## 2015-08-20 LAB — IRON AND TIBC
IRON: 75 ug/dL (ref 28–170)
SATURATION RATIOS: 26 % (ref 10.4–31.8)
TIBC: 288 ug/dL (ref 250–450)
UIBC: 213 ug/dL

## 2015-08-20 LAB — FERRITIN: FERRITIN: 622 ng/mL — AB (ref 11–307)

## 2015-08-20 LAB — POCT HEMOGLOBIN-HEMACUE: HEMOGLOBIN: 11.4 g/dL — AB (ref 12.0–15.0)

## 2015-08-20 MED ORDER — DARBEPOETIN ALFA 100 MCG/0.5ML IJ SOSY
PREFILLED_SYRINGE | INTRAMUSCULAR | Status: AC
  Filled 2015-08-20: qty 0.5

## 2015-08-20 MED ORDER — DARBEPOETIN ALFA 100 MCG/0.5ML IJ SOSY
100.0000 ug | PREFILLED_SYRINGE | INTRAMUSCULAR | Status: DC
  Administered 2015-08-20: 100 ug via SUBCUTANEOUS

## 2015-09-05 ENCOUNTER — Encounter (HOSPITAL_COMMUNITY): Payer: BLUE CROSS/BLUE SHIELD

## 2015-09-05 ENCOUNTER — Ambulatory Visit: Payer: BLUE CROSS/BLUE SHIELD | Admitting: Vascular Surgery

## 2015-09-17 ENCOUNTER — Encounter (HOSPITAL_COMMUNITY)
Admission: RE | Admit: 2015-09-17 | Discharge: 2015-09-17 | Disposition: A | Discharge status: Home / Self Care | Payer: BLUE CROSS/BLUE SHIELD | Source: Ambulatory Visit | Attending: Nephrology | Admitting: Nephrology

## 2015-09-17 DIAGNOSIS — N189 Chronic kidney disease, unspecified: Secondary | ICD-10-CM

## 2015-09-17 DIAGNOSIS — N185 Chronic kidney disease, stage 5: Secondary | ICD-10-CM | POA: Insufficient documentation

## 2015-09-17 DIAGNOSIS — Z79899 Other long term (current) drug therapy: Secondary | ICD-10-CM | POA: Diagnosis not present

## 2015-09-17 DIAGNOSIS — D631 Anemia in chronic kidney disease: Secondary | ICD-10-CM | POA: Insufficient documentation

## 2015-09-17 DIAGNOSIS — Z5181 Encounter for therapeutic drug level monitoring: Secondary | ICD-10-CM | POA: Diagnosis not present

## 2015-09-17 LAB — IRON AND TIBC
Iron: 70 ug/dL (ref 28–170)
SATURATION RATIOS: 27 % (ref 10.4–31.8)
TIBC: 262 ug/dL (ref 250–450)
UIBC: 192 ug/dL

## 2015-09-17 LAB — FERRITIN: FERRITIN: 492 ng/mL — AB (ref 11–307)

## 2015-09-17 LAB — POCT HEMOGLOBIN-HEMACUE: HEMOGLOBIN: 10.9 g/dL — AB (ref 12.0–15.0)

## 2015-09-17 MED ORDER — DARBEPOETIN ALFA 100 MCG/0.5ML IJ SOSY
100.0000 ug | PREFILLED_SYRINGE | INTRAMUSCULAR | Status: DC
  Administered 2015-09-17: 100 ug via SUBCUTANEOUS

## 2015-09-17 MED ORDER — DARBEPOETIN ALFA 100 MCG/0.5ML IJ SOSY
PREFILLED_SYRINGE | INTRAMUSCULAR | Status: AC
  Filled 2015-09-17: qty 0.5

## 2015-10-15 ENCOUNTER — Encounter (HOSPITAL_COMMUNITY)
Admission: RE | Admit: 2015-10-15 | Discharge: 2015-10-15 | Disposition: A | Discharge status: Home / Self Care | Payer: BLUE CROSS/BLUE SHIELD | Source: Ambulatory Visit | Attending: Nephrology | Admitting: Nephrology

## 2015-10-15 DIAGNOSIS — D631 Anemia in chronic kidney disease: Secondary | ICD-10-CM | POA: Insufficient documentation

## 2015-10-15 DIAGNOSIS — Z79899 Other long term (current) drug therapy: Secondary | ICD-10-CM | POA: Diagnosis not present

## 2015-10-15 DIAGNOSIS — Z5181 Encounter for therapeutic drug level monitoring: Secondary | ICD-10-CM | POA: Insufficient documentation

## 2015-10-15 DIAGNOSIS — N185 Chronic kidney disease, stage 5: Secondary | ICD-10-CM | POA: Diagnosis not present

## 2015-10-15 LAB — FERRITIN: FERRITIN: 505 ng/mL — AB (ref 11–307)

## 2015-10-15 LAB — IRON AND TIBC
Iron: 50 ug/dL (ref 28–170)
Saturation Ratios: 19 % (ref 10.4–31.8)
TIBC: 270 ug/dL (ref 250–450)
UIBC: 220 ug/dL

## 2015-10-15 MED ORDER — DARBEPOETIN ALFA 100 MCG/0.5ML IJ SOSY
100.0000 ug | PREFILLED_SYRINGE | INTRAMUSCULAR | Status: DC
  Administered 2015-10-15: 100 ug via SUBCUTANEOUS

## 2015-10-15 MED ORDER — DARBEPOETIN ALFA 100 MCG/0.5ML IJ SOSY
PREFILLED_SYRINGE | INTRAMUSCULAR | Status: AC
  Administered 2015-10-15: 100 ug via SUBCUTANEOUS
  Filled 2015-10-15: qty 0.5

## 2015-10-16 LAB — POCT HEMOGLOBIN-HEMACUE: Hemoglobin: 10.5 g/dL — ABNORMAL LOW (ref 12.0–15.0)

## 2015-10-21 ENCOUNTER — Telehealth: Payer: Self-pay

## 2015-10-21 NOTE — Telephone Encounter (Signed)
rec'd phone call from pt.  Stated she has not started dialysis.  Reported she saw Dr. Lorrene Reid about 2 weeks ago, and her AVG was noted to have a "high-pitched" sound.  The pt. stated she checks this regularly, and noted on Sat., that there was no bruit or thrill.  Discussed with Dr. Donnetta Hutching.  Recommended an office visit for eval; no vasc study needed, initially.

## 2015-10-23 ENCOUNTER — Other Ambulatory Visit: Payer: Self-pay | Admitting: *Deleted

## 2015-10-24 ENCOUNTER — Encounter (HOSPITAL_COMMUNITY): Payer: Self-pay | Admitting: *Deleted

## 2015-10-24 ENCOUNTER — Ambulatory Visit: Payer: BLUE CROSS/BLUE SHIELD | Admitting: Vascular Surgery

## 2015-10-24 NOTE — Progress Notes (Signed)
Pt denies SOB, chest pain, and being under the care of a cardiologist. Pt denies having a stress test and cardiac cath. Pt made aware to stop taking Vitamins, fish oil and herbal medications. Do not take any NSAIDs ie: Ibuprofen, Advil, Naproxen, BC and Goody Powder. Pt verbalized understanding of all pre-op instructions.

## 2015-10-27 ENCOUNTER — Ambulatory Visit (HOSPITAL_COMMUNITY): Payer: BLUE CROSS/BLUE SHIELD | Admitting: Certified Registered Nurse Anesthetist

## 2015-10-27 ENCOUNTER — Encounter (HOSPITAL_COMMUNITY): Payer: Self-pay | Admitting: Certified Registered"

## 2015-10-27 ENCOUNTER — Encounter (HOSPITAL_COMMUNITY)
Admission: RE | Disposition: A | Discharge status: Home / Self Care | Payer: Self-pay | Source: Ambulatory Visit | Attending: Vascular Surgery

## 2015-10-27 ENCOUNTER — Ambulatory Visit (HOSPITAL_COMMUNITY)
Admission: RE | Admit: 2015-10-27 | Discharge: 2015-10-27 | Disposition: A | Discharge status: Home / Self Care | Payer: BLUE CROSS/BLUE SHIELD | Source: Ambulatory Visit | Attending: Vascular Surgery | Admitting: Vascular Surgery

## 2015-10-27 DIAGNOSIS — Y828 Other medical devices associated with adverse incidents: Secondary | ICD-10-CM | POA: Insufficient documentation

## 2015-10-27 DIAGNOSIS — M109 Gout, unspecified: Secondary | ICD-10-CM | POA: Insufficient documentation

## 2015-10-27 DIAGNOSIS — I132 Hypertensive heart and chronic kidney disease with heart failure and with stage 5 chronic kidney disease, or end stage renal disease: Secondary | ICD-10-CM | POA: Insufficient documentation

## 2015-10-27 DIAGNOSIS — I509 Heart failure, unspecified: Secondary | ICD-10-CM | POA: Insufficient documentation

## 2015-10-27 DIAGNOSIS — I420 Dilated cardiomyopathy: Secondary | ICD-10-CM | POA: Insufficient documentation

## 2015-10-27 DIAGNOSIS — F419 Anxiety disorder, unspecified: Secondary | ICD-10-CM | POA: Diagnosis not present

## 2015-10-27 DIAGNOSIS — N186 End stage renal disease: Secondary | ICD-10-CM | POA: Insufficient documentation

## 2015-10-27 DIAGNOSIS — I739 Peripheral vascular disease, unspecified: Secondary | ICD-10-CM | POA: Insufficient documentation

## 2015-10-27 DIAGNOSIS — D631 Anemia in chronic kidney disease: Secondary | ICD-10-CM | POA: Insufficient documentation

## 2015-10-27 DIAGNOSIS — J45909 Unspecified asthma, uncomplicated: Secondary | ICD-10-CM | POA: Diagnosis not present

## 2015-10-27 DIAGNOSIS — E785 Hyperlipidemia, unspecified: Secondary | ICD-10-CM | POA: Diagnosis not present

## 2015-10-27 DIAGNOSIS — M1990 Unspecified osteoarthritis, unspecified site: Secondary | ICD-10-CM | POA: Insufficient documentation

## 2015-10-27 DIAGNOSIS — T82868A Thrombosis of vascular prosthetic devices, implants and grafts, initial encounter: Secondary | ICD-10-CM | POA: Diagnosis not present

## 2015-10-27 HISTORY — DX: Family history of other specified conditions: Z84.89

## 2015-10-27 HISTORY — PX: REVISION OF ARTERIOVENOUS GORETEX GRAFT: SHX6073

## 2015-10-27 LAB — POCT I-STAT 4, (NA,K, GLUC, HGB,HCT)
GLUCOSE: 84 mg/dL (ref 65–99)
HCT: 34 % — ABNORMAL LOW (ref 36.0–46.0)
Hemoglobin: 11.6 g/dL — ABNORMAL LOW (ref 12.0–15.0)
POTASSIUM: 5 mmol/L (ref 3.5–5.1)
SODIUM: 143 mmol/L (ref 135–145)

## 2015-10-27 LAB — I-STAT BETA HCG BLOOD, ED (NOT ORDERABLE): I-stat hCG, quantitative: <5 m[IU]/mL (ref <5)

## 2015-10-27 SURGERY — REVISION OF ARTERIOVENOUS GORETEX GRAFT
Anesthesia: General | Site: Arm Upper | Laterality: Right

## 2015-10-27 MED ORDER — OXYCODONE-ACETAMINOPHEN 5-325 MG PO TABS: 1.0000 | ORAL_TABLET | Freq: Four times a day (QID) | ORAL | 0 refills | Status: DC | PRN

## 2015-10-27 MED ORDER — MIDAZOLAM HCL 5 MG/5ML IJ SOLN
INTRAMUSCULAR | Status: DC | PRN
  Administered 2015-10-27: 2 mg via INTRAVENOUS

## 2015-10-27 MED ORDER — LIDOCAINE HCL (CARDIAC) 20 MG/ML IV SOLN
INTRAVENOUS | Status: DC | PRN
  Administered 2015-10-27: 20 mg via INTRAVENOUS

## 2015-10-27 MED ORDER — SODIUM CHLORIDE 0.9 % IV SOLN: INTRAVENOUS | Status: DC

## 2015-10-27 MED ORDER — CEFUROXIME SODIUM 1.5 G IJ SOLR
1.5000 g | INTRAMUSCULAR | Status: AC
  Administered 2015-10-27: 1.5 g via INTRAVENOUS

## 2015-10-27 MED ORDER — PROPOFOL 10 MG/ML IV BOLUS
INTRAVENOUS | Status: AC
  Filled 2015-10-27: qty 20

## 2015-10-27 MED ORDER — FENTANYL CITRATE (PF) 250 MCG/5ML IJ SOLN
INTRAMUSCULAR | Status: AC
Start: 2015-10-27 — End: 2015-10-27
  Filled 2015-10-27: qty 5

## 2015-10-27 MED ORDER — FENTANYL CITRATE (PF) 100 MCG/2ML IJ SOLN
INTRAMUSCULAR | Status: DC | PRN
  Administered 2015-10-27: 50 ug via INTRAVENOUS
  Administered 2015-10-27 (×6): 25 ug via INTRAVENOUS

## 2015-10-27 MED ORDER — PROMETHAZINE HCL 25 MG/ML IJ SOLN: 6.2500 mg | INTRAMUSCULAR | Status: DC | PRN

## 2015-10-27 MED ORDER — LIDOCAINE 2% (20 MG/ML) 5 ML SYRINGE
INTRAMUSCULAR | Status: AC
  Filled 2015-10-27: qty 5

## 2015-10-27 MED ORDER — LIDOCAINE HCL (PF) 1 % IJ SOLN
INTRAMUSCULAR | Status: AC
  Filled 2015-10-27: qty 30

## 2015-10-27 MED ORDER — PROPOFOL 10 MG/ML IV BOLUS
INTRAVENOUS | Status: DC | PRN
  Administered 2015-10-27: 200 mg via INTRAVENOUS

## 2015-10-27 MED ORDER — 0.9 % SODIUM CHLORIDE (POUR BTL) OPTIME
TOPICAL | Status: DC | PRN
  Administered 2015-10-27: 1000 mL

## 2015-10-27 MED ORDER — ONDANSETRON HCL 4 MG/2ML IJ SOLN
INTRAMUSCULAR | Status: DC | PRN
  Administered 2015-10-27: 4 mg via INTRAVENOUS

## 2015-10-27 MED ORDER — HEPARIN SODIUM (PORCINE) 1000 UNIT/ML IJ SOLN
INTRAMUSCULAR | Status: DC | PRN
  Administered 2015-10-27: 8000 [IU] via INTRAVENOUS

## 2015-10-27 MED ORDER — LACTATED RINGERS IV SOLN
INTRAVENOUS | Status: DC
  Administered 2015-10-27: 13:00:00 via INTRAVENOUS

## 2015-10-27 MED ORDER — FENTANYL CITRATE (PF) 100 MCG/2ML IJ SOLN: 25.0000 ug | INTRAMUSCULAR | Status: DC | PRN

## 2015-10-27 MED ORDER — MIDAZOLAM HCL 2 MG/2ML IJ SOLN
INTRAMUSCULAR | Status: AC
  Filled 2015-10-27: qty 2

## 2015-10-27 MED ORDER — MIDAZOLAM HCL 2 MG/2ML IJ SOLN: 0.5000 mg | Freq: Once | INTRAMUSCULAR | Status: DC | PRN

## 2015-10-27 MED ORDER — DEXTROSE 5 % IV SOLN
INTRAVENOUS | Status: AC
  Filled 2015-10-27: qty 1.5

## 2015-10-27 MED ORDER — CHLORHEXIDINE GLUCONATE CLOTH 2 % EX PADS: 6.0000 | MEDICATED_PAD | Freq: Once | CUTANEOUS | Status: DC

## 2015-10-27 MED ORDER — FENTANYL CITRATE (PF) 100 MCG/2ML IJ SOLN
INTRAMUSCULAR | Status: AC
  Filled 2015-10-27: qty 2

## 2015-10-27 MED ORDER — MEPERIDINE HCL 25 MG/ML IJ SOLN: 6.2500 mg | INTRAMUSCULAR | Status: DC | PRN

## 2015-10-27 MED ORDER — SODIUM CHLORIDE 0.9 % IV SOLN
INTRAVENOUS | Status: DC | PRN
  Administered 2015-10-27: 13:00:00

## 2015-10-27 SURGICAL SUPPLY — 36 items
ADH SKN CLS APL DERMABOND .7 (GAUZE/BANDAGES/DRESSINGS) ×1
AGENT HMST SPONGE THK3/8 (HEMOSTASIS)
CANISTER SUCTION 2500CC (MISCELLANEOUS) ×2 IMPLANT
CANNULA VESSEL 3MM 2 BLNT TIP (CANNULA) ×1 IMPLANT
CATH EMB 4FR 80CM (CATHETERS) ×1 IMPLANT
CLIP TI MEDIUM 6 (CLIP) ×2 IMPLANT
CLIP TI WIDE RED SMALL 6 (CLIP) ×2 IMPLANT
DERMABOND ADVANCED (GAUZE/BANDAGES/DRESSINGS) ×1
DERMABOND ADVANCED .7 DNX12 (GAUZE/BANDAGES/DRESSINGS) ×1 IMPLANT
ELECT REM PT RETURN 9FT ADLT (ELECTROSURGICAL) ×2
ELECTRODE REM PT RTRN 9FT ADLT (ELECTROSURGICAL) ×1 IMPLANT
GLOVE BIO SURGEON STRL SZ7.5 (GLOVE) ×1 IMPLANT
GLOVE BIOGEL PI IND STRL 6 (GLOVE) IMPLANT
GLOVE BIOGEL PI IND STRL 7.0 (GLOVE) IMPLANT
GLOVE BIOGEL PI IND STRL 7.5 (GLOVE) ×1 IMPLANT
GLOVE BIOGEL PI INDICATOR 6 (GLOVE) ×1
GLOVE BIOGEL PI INDICATOR 7.0 (GLOVE) ×2
GLOVE BIOGEL PI INDICATOR 7.5 (GLOVE) ×1
GLOVE ECLIPSE 6.5 STRL STRAW (GLOVE) ×1 IMPLANT
GLOVE ECLIPSE 7.0 STRL STRAW (GLOVE) ×1 IMPLANT
GOWN STRL NON-REIN LRG LVL3 (GOWN DISPOSABLE) ×1 IMPLANT
GOWN STRL REUS W/ TWL LRG LVL3 (GOWN DISPOSABLE) ×3 IMPLANT
GOWN STRL REUS W/TWL LRG LVL3 (GOWN DISPOSABLE) ×6
GRAFT GORETEX 6X10 (Vascular Products) ×1 IMPLANT
HEMOSTAT SPONGE AVITENE ULTRA (HEMOSTASIS) IMPLANT
KIT BASIN OR (CUSTOM PROCEDURE TRAY) ×2 IMPLANT
KIT ROOM TURNOVER OR (KITS) ×2 IMPLANT
NS IRRIG 1000ML POUR BTL (IV SOLUTION) ×2 IMPLANT
PACK CV ACCESS (CUSTOM PROCEDURE TRAY) ×2 IMPLANT
PAD ARMBOARD 7.5X6 YLW CONV (MISCELLANEOUS) ×4 IMPLANT
SUT MNCRL AB 4-0 PS2 18 (SUTURE) ×2 IMPLANT
SUT PROLENE 6 0 CC (SUTURE) ×2 IMPLANT
SUT VIC AB 3-0 SH 27 (SUTURE) ×6
SUT VIC AB 3-0 SH 27X BRD (SUTURE) ×2 IMPLANT
UNDERPAD 30X30 (UNDERPADS AND DIAPERS) ×2 IMPLANT
WATER STERILE IRR 1000ML POUR (IV SOLUTION) ×2 IMPLANT

## 2015-10-27 NOTE — Transfer of Care (Signed)
Immediate Anesthesia Transfer of Care Note  Patient: [name]  Procedure(s) Performed: Procedure(s): THROMBECTOMY AND REVISION OF RIGHT UPPER ARM  ARTERIOVENOUS GORETEX GRAFT USING 6MM X 10CM GORE-TEX GRAFT (Right)  Patient Location: PACU  Anesthesia Type:General  Level of Consciousness: awake, alert  and oriented  Airway & Oxygen Therapy: Patient Spontanous Breathing and Patient connected to nasal cannula oxygen  Post-op Assessment: Report given to RN and Post -op Vital signs reviewed and stable  Post vital signs: Reviewed and stable  Last Vitals:  Vitals:   10/27/15 1110  BP: (!) 158/70  Pulse: 65  Resp: 20  Temp: 36.8 C    Last Pain:  Vitals:   10/27/15 1110  TempSrc: Oral      Patients Stated Pain Goal: 3 (93/24/19 9144)  Complications: No apparent anesthesia complications

## 2015-10-27 NOTE — H&P (Signed)
Brief History and Physical  History of Present Illness  [name] is a 40 y.o. female who presents with chief complaint: clotted RUA AVG.  This pt notes lost of thrill in her access one week ago.  She previously had a percutaneous thrombectomy of this RUA AVG.  On that procedure, venous anastomosis stenosis was noted and angioplastied.  The patient presents today for RUA AVG T&R.    Past Medical History:  Diagnosis Date  . Anemia   . Anxiety    pt not aware of this  . Arthritis   . CHF (congestive heart failure) ([place])    hosp. 2011  . Chronic kidney disease    not on dialysis at this time  . Dilated cardiomyopathy ([place])   . Encounter for blood transfusion   . Family history of adverse reaction to anesthesia     " Aunt has a tracheostomy and kept coughing after colonoscopy "  . Foot fracture   . Gout   . Heart murmur    as a child  . History of blood transfusion   . History of pneumonia   . Hyperlipidemia   . Hypertension   . Menorrhagia   . Morbid obesity ([place])   . Pneumonia    hosp. 2010  . Shortness of breath    seldom  . Thyroid disease     Past Surgical History:  Procedure Laterality Date  . ANGIOPLASTY  07/28/2015   Procedure: ANGIOPLASTY OF RIGHT BRACHIAL VEIN;  Surgeon: Conrad Raymond, MD;  Location: [place];  Service: Vascular;;  . AV FISTULA PLACEMENT  08/04/2011   Procedure: ARTERIOVENOUS (AV) FISTULA CREATION;  Surgeon: Elam Dutch, MD;  Location: [facility] OR;  Service: Vascular;  Laterality: Right;  Creation right brachiocephalic arteriovenous fistula  . AV FISTULA PLACEMENT Left 02/05/2013   Procedure: ARTERIOVENOUS FISTULA CREATION LEFT ARM;  Surgeon: Conrad Cadott, MD;  Location: [place];  Service: Vascular;  Laterality: Left;  . AV FISTULA PLACEMENT Right 06/04/2013   Procedure: ARTERIOVENOUS (AV) FISTULA CREATION;  Surgeon: Conrad [place] Olive, MD;  Location: [place];  Service: Vascular;  Laterality: Right;  . AV FISTULA PLACEMENT Right 04/15/2014   Procedure:  INSERTION OF ARTERIOVENOUS (AV) GORE-TEX GRAFT ARM;  Surgeon: Conrad Britton, MD;  Location: [place];  Service: Vascular;  Laterality: Right;  . BASCILIC VEIN TRANSPOSITION Right 08/13/2013   Procedure: Second Stage Brachial Vein Transposition;  Surgeon: Conrad Sharpsville, MD;  Location: [place];  Service: Vascular;  Laterality: Right;  . CARDIAC SURGERY  1979   repair of hole in heart  . INCISION AND DRAINAGE OF WOUND Right 07/28/2015   Procedure: IRRIGATION AND DEBRIDEMENT RIGHT HAND  WOUND;  Surgeon: Dayna Barker, MD;  Location: [place];  Service: Plastics;  Laterality: Right;  . LIGATION OF ARTERIOVENOUS  FISTULA Left 03/05/2013   Procedure: LIGATION OF ARTERIOVENOUS  FISTULA;  Surgeon: Conrad Republic, MD;  Location: [place];  Service: Vascular;  Laterality: Left;  . PATCH ANGIOPLASTY Left 03/05/2013   Procedure: PATCH ANGIOPLASTY;  Surgeon: Conrad Roy Lake, MD;  Location: [place];  Service: Vascular;  Laterality: Left;  . PERIPHERAL VASCULAR CATHETERIZATION Right 10/31/2014   Procedure: Fistulagram;  Surgeon: Conrad Olowalu, MD;  Location: [place] CV LAB;  Service: Cardiovascular;  Laterality: Right;  . PERIPHERAL VASCULAR CATHETERIZATION Right 10/31/2014   Procedure: Peripheral Vascular Balloon Angioplasty;  Surgeon: Conrad [place] City, MD;  Location: [place] CV [facility];  Service: Cardiovascular;  Laterality:  Right;  pta venous rt arm  . PERIPHERAL VASCULAR CATHETERIZATION N/A 03/13/2015   Procedure: A/V Shuntogram;  Surgeon: Conrad Slinger, MD;  Location: [place] CV LAB;  Service: Cardiovascular;  Laterality: N/A;  . PERIPHERAL VASCULAR CATHETERIZATION Right 03/13/2015   Procedure: Peripheral Vascular Balloon Angioplasty;  Surgeon: Conrad Beggs, MD;  Location: [place] CV LAB;  Service: Cardiovascular;  Laterality: Right;  Shunt  . THROMBECTOMY W/ EMBOLECTOMY Left 03/05/2013   Procedure: THROMBECTOMY OF LEFT BRACHIAL ARTERY;  Surgeon: Conrad [place], MD;  Location: [place];  Service: Vascular;  Laterality: Left;  Marland Kitchen VENOGRAM  Bilateral 03/25/2014   Procedure: VENOGRAM bilateral;  Surgeon: Conrad Iola, MD;  [facility] CATH LAB;  Service: Cardiovascular;  Laterality: Bilateral;    Social History   Social History  . Marital status: Married    Spouse name: N/A  . Number of children: N/A  . Years of education: N/A   Occupational History  . Not on file.   Social History Main Topics  . Smoking status: Never Smoker  . Smokeless tobacco: Never Used  . Alcohol use No  . Drug use: No  . Sexual activity: Yes    Birth control/ protection: None     Comment: no birth control    Other Topics Concern  . Not on file   Social History Narrative  . No narrative on file    Family History  Problem Relation Age of Onset  . Diabetes Mother   . Hyperlipidemia Mother   . Hypertension Mother   . Kidney disease Mother   . Anesthesia problems Neg Hx   . Hypotension Neg Hx   . Malignant hyperthermia Neg Hx   . Pseudochol deficiency Neg Hx     Current Facility-Administered Medications on File Prior to Encounter  Medication Dose Route Frequency Provider Last Rate Last Dose  . 0.9 %  sodium chloride infusion   Intravenous Continuous Conrad [place], MD       Current Outpatient Prescriptions on File Prior to Encounter  Medication Sig Dispense Refill  . allopurinol (ZYLOPRIM) 100 MG tablet Take 200 mg by mouth daily.     . carvedilol (COREG) 25 MG tablet Take 25 mg by mouth 2 (two) times daily.     . ferrous sulfate 325 (65 FE) MG EC tablet Take 1 tablet (325 mg total) by mouth 2 (two) times daily. (Patient taking differently: Take 650 mg by mouth daily with breakfast. ) 60 tablet 1  . furosemide (LASIX) 80 MG tablet Take 80 mg by mouth 2 (two) times daily.    . hydrALAZINE (APRESOLINE) 100 MG tablet Take 100 mg by mouth 3 (three) times daily.     . megestrol (MEGACE) 20 MG tablet Take 1 tablet (20 mg total) by mouth 2 (two) times daily. (Patient taking differently: Take 40 mg by mouth daily. ) 60 tablet 12  .  pravastatin (PRAVACHOL) 40 MG tablet Take 40 mg by mouth daily.     Marland Kitchen RENVELA 800 MG tablet Take 1,600 mg by mouth 3 (three) times daily with meals. And snacks      Allergies  Allergen Reactions  . No Known Allergies     Review of Systems: As listed above, otherwise negative.  Physical Examination  Vitals:   10/27/15 1110  BP: (!) 158/70  Pulse: 65  Resp: 20  Temp: 98.3 F (36.8 C)  TempSrc: Oral  SpO2: 100%    General: A&O x 3, WDWN  Pulmonary: Sym  exp, good air movt, CTAB, no rales, rhonchi, & wheezing  Cardiac: RRR, Nl S1, S2, no Murmurs, rubs or gallops  Gastrointestinal: soft, NTND, -G/R, - HSM, - masses, - CVAT B  Musculoskeletal: M/S 5/5 throughout , RUA AVG without thrill or bruit  Laboratory See iStat  Medical Decision Making  LARIA GRIMMETT is a 40 y.o. female who presents with: thrombosed RUA AVG.   The patient is scheduled for: Thrombectomy and revision of her RUA AVG.  Risk, benefits, and alternatives to access surgery were discussed.  The patient is aware the risks include but are not limited to: bleeding, infection, steal syndrome, nerve damage, ischemic monomelic neuropathy, failure to mature, and need for additional procedures.  The patient is aware of the risks and agrees to proceed.  Adele Barthel, MD [facility] Office: [phone] Pager: [phone]  10/27/2015, 12:27 PM

## 2015-10-27 NOTE — Anesthesia Postprocedure Evaluation (Signed)
Anesthesia Post Note  Patient: [name]  Procedure(s) Performed: Procedure(s) (LRB): THROMBECTOMY AND REVISION OF RIGHT UPPER ARM  ARTERIOVENOUS GORETEX GRAFT USING 6MM X 10CM GORE-TEX GRAFT (Right)  Patient location during evaluation: PACU Anesthesia Type: General Level of consciousness: awake and alert, oriented and patient cooperative Pain management: pain level controlled Vital Signs Assessment: post-procedure vital signs reviewed and stable Respiratory status: spontaneous breathing, nonlabored ventilation and respiratory function stable Cardiovascular status: blood pressure returned to baseline and stable Postop Assessment: no signs of nausea or vomiting Anesthetic complications: no    Last Vitals:  Vitals:   10/27/15 1545 10/27/15 1600  BP: (!) 152/74 (!) 144/83  Pulse: 78 70  Resp: 14 15  Temp:  36.6 C    Last Pain:  Vitals:   10/27/15 1645  TempSrc:   PainSc: 4                  Gershon Shorten,E. Onie Kasparek

## 2015-10-27 NOTE — Anesthesia Procedure Notes (Signed)
Procedure Name: LMA Insertion Date/Time: 10/27/2015 1:28 PM Performed by: Clearnce Sorrel Pre-anesthesia Checklist: Patient identified, Emergency Drugs available, Suction available, Patient being monitored and Timeout performed Patient Re-evaluated:Patient Re-evaluated prior to inductionOxygen Delivery Method: Circle system utilized Preoxygenation: Pre-oxygenation with 100% oxygen Intubation Type: IV induction Ventilation: Mask ventilation without difficulty LMA: LMA inserted LMA Size: 4.0 Number of attempts: 1 Placement Confirmation: positive ETCO2 and breath sounds checked- equal and bilateral Tube secured with: Tape Dental Injury: Teeth and Oropharynx as per pre-operative assessment

## 2015-10-27 NOTE — Discharge Instructions (Signed)
No lifting more than 5 lbs. for a month on left arm.

## 2015-10-27 NOTE — Anesthesia Preprocedure Evaluation (Addendum)
Anesthesia Evaluation  Patient identified by MRN, date of birth, ID band Patient awake    Reviewed: Allergy & Precautions, NPO status , Patient's Chart, lab work & pertinent test results  History of Anesthesia Complications Negative for: history of anesthetic complications  Airway Mallampati: II  TM Distance: >3 FB Neck ROM: Full    Dental  (+) Teeth Intact, Dental Advisory Given, Chipped   Pulmonary shortness of breath, asthma (last inhaler use a week ago) ,    breath sounds clear to auscultation       Cardiovascular hypertension, Pt. on medications and Pt. 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)     Musculoskeletal  (+) Arthritis ,   Abdominal (+) + obese,   Peds  Hematology  (+) anemia ,   Anesthesia Other Findings   Reproductive/Obstetrics                           Anesthesia Physical  Anesthesia Plan  ASA: III  Anesthesia Plan: General   Post-op Pain Management:    Induction: Intravenous  Airway Management Planned: LMA  Additional Equipment:   Intra-op Plan:   Post-operative Plan:   Informed Consent: I have reviewed the patients History and Physical, chart, labs and discussed the procedure including the risks, benefits and alternatives for the proposed anesthesia with the patient or authorized representative who has indicated his/her understanding and acceptance.   Dental advisory given  Plan Discussed with: CRNA, Anesthesiologist and Surgeon  Anesthesia Plan Comments: (Plan routine monitors, GA- LMA OK)       Anesthesia Quick Evaluation

## 2015-10-27 NOTE — Op Note (Signed)
Procedure: Thrombectomy and revision of right upper arm AV graft  Preoperative diagnosis: Thrombosed AV graft right arm  Postoperative diagnosis: Same  Anesthesia: General  Assistant: Gerri Lins PA-C  Operative findings: Greater than 8 mm outflow vein, revised to 6 mm interposition graft  Operative details: After obtaining informed consent, the patient was taken to the operating room. The patient was placed in supine position on the operating room table. After adequate sedation, the patient's entire right upper extremity was prepped and draped in the usual sterile fashion. Local anesthesia was infiltrated at a pre-existing scar in the axilla. A longitudinal incision was made in this location through a pre-existing scar. The incision was carried into the subcutaneous tissues down to level the graft. The graft was dissected free circumferentially. Dissection was carried down to the level of the venous anastomosis. This was a very large axillary vein between 7 and 8 mm in diameter. This was dissected free circumferentially. The patient was given 7,000 units of intravenous heparin. A transverse graftotomy was made just above the level of the anastomosis. A #4 Fogarty catheter was used to thrombectomize the venous limb of the graft. There was some venous backbleeding but there were still intimal hyperplasia within the vein and the vein was quite thickened. Therefore the vein was opened longitudinally past the segment intimal hyperplasia. The thickened portion of vein was debrided away. The arterial limb the graft was then thrombectomized with a #4 Fogarty catheter excellent arterial inflow was obtained. This was clamped proximally with a fistula clamp. A new 6 mm interposition graft was brought up in the operative field and sewn end-to-end to the vein using a running 6-0 Prolene suture. At completion of the anastomosis the venous limb was thoroughly flushed with heparinized saline and reoccluded. The  graft was cut to length in preparation for sewing to the proximal graft. This was then sewn end-to-end to the proximal arterial limb of the graft using running 6-0 Prolene suture. Just prior to completion, the anastomosis was forebled backbled and thoroughly flushed. The anastomosis was secured; clamps were released; and there was a palpable thrill in the graft immediately. Hemostasis was obtained with direct pressure  The subcutaneous tissues were reapproximated with running 3-0 Vicryl suture. The skin was closed with a 4 0 Vicryl subcuticular stitch. Dermabond was applied the incision. The patient tolerated the procedure well and there were no complications. Instrument sponge and needle counts were correct at the end of the case. The patient was taken to the recovery room in stable condition.  Ruta Hinds, MD [facility] [place] [facility] Office: [phone] Pager: [phone]

## 2015-10-28 ENCOUNTER — Encounter (HOSPITAL_COMMUNITY): Payer: Self-pay | Admitting: Vascular Surgery

## 2015-10-28 ENCOUNTER — Encounter: Payer: Self-pay | Admitting: *Deleted

## 2015-11-12 ENCOUNTER — Encounter (HOSPITAL_COMMUNITY)
Admission: RE | Admit: 2015-11-12 | Discharge: 2015-11-12 | Disposition: A | Discharge status: Home / Self Care | Payer: BLUE CROSS/BLUE SHIELD | Source: Ambulatory Visit | Attending: Nephrology | Admitting: Nephrology

## 2015-11-12 DIAGNOSIS — D631 Anemia in chronic kidney disease: Secondary | ICD-10-CM | POA: Insufficient documentation

## 2015-11-12 DIAGNOSIS — Z5181 Encounter for therapeutic drug level monitoring: Secondary | ICD-10-CM | POA: Diagnosis not present

## 2015-11-12 DIAGNOSIS — N185 Chronic kidney disease, stage 5: Secondary | ICD-10-CM | POA: Insufficient documentation

## 2015-11-12 DIAGNOSIS — Z79899 Other long term (current) drug therapy: Secondary | ICD-10-CM | POA: Insufficient documentation

## 2015-11-12 LAB — FERRITIN: Ferritin: 566 ng/mL — ABNORMAL HIGH (ref 11–307)

## 2015-11-12 LAB — IRON AND TIBC
Iron: 71 ug/dL (ref 28–170)
Saturation Ratios: 26 % (ref 10.4–31.8)
TIBC: 276 ug/dL (ref 250–450)
UIBC: 205 ug/dL

## 2015-11-12 LAB — POCT HEMOGLOBIN-HEMACUE: HEMOGLOBIN: 11.1 g/dL — AB (ref 12.0–15.0)

## 2015-11-12 MED ORDER — DARBEPOETIN ALFA 100 MCG/0.5ML IJ SOSY
PREFILLED_SYRINGE | INTRAMUSCULAR | Status: AC
  Filled 2015-11-12: qty 0.5

## 2015-11-12 MED ORDER — DARBEPOETIN ALFA 100 MCG/0.5ML IJ SOSY
100.0000 ug | PREFILLED_SYRINGE | INTRAMUSCULAR | Status: DC
  Administered 2015-11-12: 100 ug via SUBCUTANEOUS

## 2015-12-10 ENCOUNTER — Encounter (HOSPITAL_COMMUNITY)
Admission: RE | Admit: 2015-12-10 | Discharge: 2015-12-10 | Disposition: A | Discharge status: Home / Self Care | Payer: BLUE CROSS/BLUE SHIELD | Source: Ambulatory Visit | Attending: Nephrology | Admitting: Nephrology

## 2015-12-10 DIAGNOSIS — D631 Anemia in chronic kidney disease: Secondary | ICD-10-CM | POA: Insufficient documentation

## 2015-12-10 DIAGNOSIS — Z5181 Encounter for therapeutic drug level monitoring: Secondary | ICD-10-CM | POA: Insufficient documentation

## 2015-12-10 DIAGNOSIS — Z79899 Other long term (current) drug therapy: Secondary | ICD-10-CM | POA: Insufficient documentation

## 2015-12-10 DIAGNOSIS — N185 Chronic kidney disease, stage 5: Secondary | ICD-10-CM | POA: Insufficient documentation

## 2015-12-10 LAB — IRON AND TIBC
Iron: 50 ug/dL (ref 28–170)
SATURATION RATIOS: 20 % (ref 10.4–31.8)
TIBC: 255 ug/dL (ref 250–450)
UIBC: 205 ug/dL

## 2015-12-10 LAB — POCT HEMOGLOBIN-HEMACUE: HEMOGLOBIN: 10.9 g/dL — AB (ref 12.0–15.0)

## 2015-12-10 LAB — FERRITIN: Ferritin: 497 ng/mL — ABNORMAL HIGH (ref 11–307)

## 2015-12-10 MED ORDER — DARBEPOETIN ALFA 100 MCG/0.5ML IJ SOSY
100.0000 ug | PREFILLED_SYRINGE | INTRAMUSCULAR | Status: DC
  Administered 2015-12-10: 100 ug via SUBCUTANEOUS

## 2015-12-10 MED ORDER — DARBEPOETIN ALFA 100 MCG/0.5ML IJ SOSY
PREFILLED_SYRINGE | INTRAMUSCULAR | Status: AC
  Administered 2015-12-10: 100 ug via SUBCUTANEOUS
  Filled 2015-12-10: qty 0.5

## 2016-01-07 ENCOUNTER — Encounter (HOSPITAL_COMMUNITY)
Admission: RE | Admit: 2016-01-07 | Discharge: 2016-01-07 | Disposition: A | Discharge status: Home / Self Care | Payer: BLUE CROSS/BLUE SHIELD | Source: Ambulatory Visit | Attending: Nephrology | Admitting: Nephrology

## 2016-01-07 DIAGNOSIS — N185 Chronic kidney disease, stage 5: Secondary | ICD-10-CM | POA: Insufficient documentation

## 2016-01-07 DIAGNOSIS — Z5181 Encounter for therapeutic drug level monitoring: Secondary | ICD-10-CM | POA: Diagnosis not present

## 2016-01-07 DIAGNOSIS — Z79899 Other long term (current) drug therapy: Secondary | ICD-10-CM | POA: Insufficient documentation

## 2016-01-07 DIAGNOSIS — D631 Anemia in chronic kidney disease: Secondary | ICD-10-CM | POA: Diagnosis not present

## 2016-01-07 LAB — IRON AND TIBC
Iron: 44 ug/dL (ref 28–170)
Saturation Ratios: 16 % (ref 10.4–31.8)
TIBC: 270 ug/dL (ref 250–450)
UIBC: 226 ug/dL

## 2016-01-07 LAB — FERRITIN: FERRITIN: 492 ng/mL — AB (ref 11–307)

## 2016-01-07 MED ORDER — DARBEPOETIN ALFA 100 MCG/0.5ML IJ SOSY
100.0000 ug | PREFILLED_SYRINGE | INTRAMUSCULAR | Status: DC
  Administered 2016-01-07: 100 ug via SUBCUTANEOUS

## 2016-01-07 MED ORDER — DARBEPOETIN ALFA 100 MCG/0.5ML IJ SOSY
PREFILLED_SYRINGE | INTRAMUSCULAR | Status: AC
  Administered 2016-01-07: 100 ug via SUBCUTANEOUS
  Filled 2016-01-07: qty 0.5

## 2016-01-08 LAB — POCT HEMOGLOBIN-HEMACUE: Hemoglobin: 10.9 g/dL — ABNORMAL LOW (ref 12.0–15.0)

## 2016-01-14 ENCOUNTER — Other Ambulatory Visit: Payer: Self-pay

## 2016-01-14 ENCOUNTER — Ambulatory Visit (INDEPENDENT_AMBULATORY_CARE_PROVIDER_SITE_OTHER): Payer: Self-pay | Admitting: Vascular Surgery

## 2016-01-14 ENCOUNTER — Encounter: Payer: Self-pay | Admitting: Vascular Surgery

## 2016-01-14 VITALS — BP 146/91 | HR 74 | Temp 97.7°F | Resp 20 | Ht 69.5 in | Wt 317.0 lb

## 2016-01-14 DIAGNOSIS — N185 Chronic kidney disease, stage 5: Secondary | ICD-10-CM

## 2016-01-14 NOTE — Progress Notes (Signed)
[facility] [place]  Patient name: [name] MRN: [id number] DOB: [birth year] Sex: female  REASON FOR VISIT: evaluate graft, referred by Dr. Lorrene Reid  HPI: [name] is a 41 y.o. female who presents with concerns of a high-pitched thrill over her right upper arm graft. Patient is not yet on hemodialysis. The patient most recently underwent thrombectomy and revision of her right upper arm graft by Dr. Oneida Alar on 10/27/2015. Prior to this, she underwent a shuntogram with percutaneous thrombectomy on 07/28/2015 by Dr. Bridgett Larsson. She has had multiple venoplasties of the venous anastomosis before this. Her right upper arm graft was originally placed on 04/15/2014 by Dr. Bridgett Larsson. She has a history of thrombosed right and left arm fistulas.   She uses a stethoscope at home to listen to her graft. She is concerned that her graft is occluding approximately every 3 months. She wanted to "catch it" before another occlusion occurs. She denies any pain or numbness to her right hand.  Past Medical History:  Diagnosis Date  . Anemia   . Anxiety    pt not aware of this  . Arthritis   . CHF (congestive heart failure) ([place])    hosp. 2011  . Chronic kidney disease    not on dialysis at this time  . Dilated cardiomyopathy ([place])   . Encounter for blood transfusion   . Family history of adverse reaction to anesthesia     " Aunt has a tracheostomy and kept coughing after colonoscopy "  . Foot fracture   . Gout   . Heart murmur    as a child  . History of blood transfusion   . History of pneumonia   . Hyperlipidemia   . Hypertension   . Menorrhagia   . Morbid obesity ([place])   . Pneumonia    hosp. 2010  . Shortness of breath    seldom  . Thyroid disease     Family History  Problem Relation Age of Onset  . Diabetes Mother   . Hyperlipidemia Mother   . Hypertension Mother   . Kidney disease Mother   . Anesthesia problems Neg Hx   . Hypotension Neg Hx   . Malignant  hyperthermia Neg Hx   . Pseudochol deficiency Neg Hx     SOCIAL HISTORY: Social History  Substance Use Topics  . Smoking status: Never Smoker  . Smokeless tobacco: Never Used  . Alcohol use No    Allergies  Allergen Reactions  . No Known Allergies     Current Outpatient Prescriptions  Medication Sig Dispense Refill  . acetaminophen (TYLENOL) 500 MG tablet Take 1,000 mg by mouth every 6 (six) hours as needed (for pain.).    Marland Kitchen allopurinol (ZYLOPRIM) 100 MG tablet Take 200 mg by mouth daily.     . carvedilol (COREG) 25 MG tablet Take 25 mg by mouth 2 (two) times daily.     . ferrous sulfate 325 (65 FE) MG EC tablet Take 1 tablet (325 mg total) by mouth 2 (two) times daily. (Patient taking differently: Take 650 mg by mouth daily with breakfast. ) 60 tablet 1  . furosemide (LASIX) 80 MG tablet Take 80 mg by mouth 2 (two) times daily.    . hydrALAZINE (APRESOLINE) 100 MG tablet Take 100 mg by mouth 3 (three) times daily.     . megestrol (MEGACE) 20 MG tablet Take 1 tablet (20 mg total) by mouth 2 (two) times daily. (Patient taking differently: Take 40  mg by mouth daily. ) 60 tablet 12  . oxyCODONE-acetaminophen (PERCOCET/ROXICET) 5-325 MG tablet Take 1 tablet by mouth every 6 (six) hours as needed. 6 tablet 0  . pravastatin (PRAVACHOL) 40 MG tablet Take 40 mg by mouth daily.     Marland Kitchen PROAIR HFA 108 (90 Base) MCG/ACT inhaler Inhale 1-2 puffs into the lungs 3 (three) times daily as needed. For wheezing.  2  . RENVELA 800 MG tablet Take 1,600 mg by mouth 3 (three) times daily with meals. And snacks     No current facility-administered medications for this visit.    Facility-Administered Medications Ordered in Other Visits  Medication Dose Route Frequency Provider Last Rate Last Dose  . 0.9 %  sodium chloride infusion   Intravenous Continuous Conrad [place], MD        REVIEW OF SYSTEMS:  [X]  denotes positive finding, [ ]  denotes negative finding Cardiac  Comments:  Chest pain or chest  pressure:    Shortness of breath upon exertion:    Short of breath when lying flat:    Irregular heart rhythm:        Vascular    Pain in calf, thigh, or hip brought on by ambulation:    Pain in feet at night that wakes you up from your sleep:     Blood clot in your veins:    Leg swelling:         Pulmonary    Oxygen at home:    Productive cough:     Wheezing:         Neurologic    Sudden weakness in arms or legs:     Sudden numbness in arms or legs:     Sudden onset of difficulty speaking or slurred speech:    Temporary loss of vision in one eye:     Problems with dizziness:         Gastrointestinal    Blood in stool:     Vomited blood:         Genitourinary    Burning when urinating:     Blood in urine:        Psychiatric    Major depression:         Hematologic    Bleeding problems:    Problems with blood clotting too easily:        Skin    Rashes or ulcers:        Constitutional    Fever or chills:      PHYSICAL EXAM: Vitals:   01/14/16 1346  BP: (!) 146/91  Pulse: 74  Resp: 20  Temp: 97.7 F (36.5 C)  TempSrc: Oral  SpO2: 96%  Weight: (!) 317 lb (143.8 kg)  Height: 5' 9.5" (1.765 m)    GENERAL: The patient is a well-nourished obese female, in no acute distress. The vital signs are documented above. VASCULAR: Palpable thrill and audible bruit right upper arm graft. PULMONARY: Nonlabored respiratory effort. MUSCULOSKELETAL: There are no major deformities or cyanosis. NEUROLOGIC: No focal weakness or paresthesias are detected. SKIN: There are no ulcers or rashes noted. PSYCHIATRIC: The patient has a normal affect.  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 However, given history of recurrent thrombosis of her right upper arm graft, we will perform a shuntogram for further evaluation. Dr. Scot Dock did see and evaluate patient. She is not on dialysis yet, therefore we will  limit the amount  of dye administered. She'll be scheduled for a right arm shuntoogram with possible intervention on 01/19/2016 with Dr. Scot Dock.    Virgina Jock, PA-C [facility] MD: Scot Dock

## 2016-01-19 ENCOUNTER — Encounter (HOSPITAL_COMMUNITY)
Admission: RE | Disposition: A | Discharge status: Home / Self Care | Payer: Self-pay | Source: Ambulatory Visit | Attending: Vascular Surgery

## 2016-01-19 ENCOUNTER — Ambulatory Visit (HOSPITAL_COMMUNITY)
Admission: RE | Admit: 2016-01-19 | Discharge: 2016-01-19 | Disposition: A | Discharge status: Home / Self Care | Payer: BLUE CROSS/BLUE SHIELD | Source: Ambulatory Visit | Attending: Vascular Surgery | Admitting: Vascular Surgery

## 2016-01-19 DIAGNOSIS — M109 Gout, unspecified: Secondary | ICD-10-CM | POA: Diagnosis not present

## 2016-01-19 DIAGNOSIS — I132 Hypertensive heart and chronic kidney disease with heart failure and with stage 5 chronic kidney disease, or end stage renal disease: Secondary | ICD-10-CM | POA: Diagnosis not present

## 2016-01-19 DIAGNOSIS — F419 Anxiety disorder, unspecified: Secondary | ICD-10-CM | POA: Diagnosis not present

## 2016-01-19 DIAGNOSIS — T82868A Thrombosis of vascular prosthetic devices, implants and grafts, initial encounter: Secondary | ICD-10-CM | POA: Insufficient documentation

## 2016-01-19 DIAGNOSIS — M1990 Unspecified osteoarthritis, unspecified site: Secondary | ICD-10-CM | POA: Insufficient documentation

## 2016-01-19 DIAGNOSIS — I509 Heart failure, unspecified: Secondary | ICD-10-CM | POA: Insufficient documentation

## 2016-01-19 DIAGNOSIS — I420 Dilated cardiomyopathy: Secondary | ICD-10-CM | POA: Diagnosis not present

## 2016-01-19 DIAGNOSIS — Z8249 Family history of ischemic heart disease and other diseases of the circulatory system: Secondary | ICD-10-CM | POA: Diagnosis not present

## 2016-01-19 DIAGNOSIS — Y832 Surgical operation with anastomosis, bypass or graft as the cause of abnormal reaction of the patient, or of later complication, without mention of misadventure at the time of the procedure: Secondary | ICD-10-CM | POA: Diagnosis not present

## 2016-01-19 DIAGNOSIS — D631 Anemia in chronic kidney disease: Secondary | ICD-10-CM | POA: Insufficient documentation

## 2016-01-19 DIAGNOSIS — E785 Hyperlipidemia, unspecified: Secondary | ICD-10-CM | POA: Insufficient documentation

## 2016-01-19 DIAGNOSIS — Z6841 Body Mass Index (BMI) 40.0 and over, adult: Secondary | ICD-10-CM | POA: Insufficient documentation

## 2016-01-19 DIAGNOSIS — Z833 Family history of diabetes mellitus: Secondary | ICD-10-CM | POA: Diagnosis not present

## 2016-01-19 DIAGNOSIS — N185 Chronic kidney disease, stage 5: Secondary | ICD-10-CM | POA: Diagnosis not present

## 2016-01-19 DIAGNOSIS — R011 Cardiac murmur, unspecified: Secondary | ICD-10-CM | POA: Insufficient documentation

## 2016-01-19 DIAGNOSIS — E079 Disorder of thyroid, unspecified: Secondary | ICD-10-CM | POA: Insufficient documentation

## 2016-01-19 HISTORY — PX: PERIPHERAL VASCULAR CATHETERIZATION: SHX172C

## 2016-01-19 LAB — POCT I-STAT, CHEM 8
BUN: 62 mg/dL — AB (ref 6–20)
Calcium, Ion: 1.31 mmol/L (ref 1.15–1.40)
Chloride: 106 mmol/L (ref 101–111)
Creatinine, Ser: 6.2 mg/dL — ABNORMAL HIGH (ref 0.44–1.00)
Glucose, Bld: 87 mg/dL (ref 65–99)
HCT: 34 % — ABNORMAL LOW (ref 36.0–46.0)
HEMOGLOBIN: 11.6 g/dL — AB (ref 12.0–15.0)
Potassium: 4.3 mmol/L (ref 3.5–5.1)
SODIUM: 143 mmol/L (ref 135–145)
TCO2: 27 mmol/L (ref 0–100)

## 2016-01-19 SURGERY — A/V SHUNTOGRAM
Anesthesia: LOCAL | Laterality: Right

## 2016-01-19 MED ORDER — IODIXANOL 320 MG/ML IV SOLN
INTRAVENOUS | Status: DC | PRN
  Administered 2016-01-19: 20 mL

## 2016-01-19 MED ORDER — LIDOCAINE HCL (PF) 1 % IJ SOLN
INTRAMUSCULAR | Status: AC
  Filled 2016-01-19: qty 30

## 2016-01-19 MED ORDER — HEPARIN (PORCINE) IN NACL 2-0.9 UNIT/ML-% IJ SOLN
INTRAMUSCULAR | Status: AC
  Filled 2016-01-19: qty 500

## 2016-01-19 MED ORDER — LIDOCAINE HCL (PF) 1 % IJ SOLN
INTRAMUSCULAR | Status: DC | PRN
  Administered 2016-01-19: 4 mL

## 2016-01-19 MED ORDER — SODIUM CHLORIDE 0.9 % IV SOLN
INTRAVENOUS | Status: DC
  Administered 2016-01-19: 08:00:00 via INTRAVENOUS

## 2016-01-19 MED ORDER — HEPARIN (PORCINE) IN NACL 2-0.9 UNIT/ML-% IJ SOLN
INTRAMUSCULAR | Status: DC | PRN
  Administered 2016-01-19: 500 mL

## 2016-01-19 SURGICAL SUPPLY — 10 items
BAG SNAP BAND KOVER 36X36 (MISCELLANEOUS) ×2 IMPLANT
COVER DOME SNAP 22 D (MISCELLANEOUS) ×2 IMPLANT
COVER PRB 48X5XTLSCP FOLD TPE (BAG) ×1 IMPLANT
COVER PROBE 5X48 (BAG) ×4
KIT MICROINTRODUCER STIFF 5F (SHEATH) ×1 IMPLANT
PROTECTION STATION PRESSURIZED (MISCELLANEOUS) ×2
STATION PROTECTION PRESSURIZED (MISCELLANEOUS) ×1 IMPLANT
STOPCOCK MORSE 400PSI 3WAY (MISCELLANEOUS) ×2 IMPLANT
TRAY PV CATH (CUSTOM PROCEDURE TRAY) ×2 IMPLANT
TUBING CIL FLEX 10 FLL-RA (TUBING) ×2 IMPLANT

## 2016-01-19 NOTE — Discharge Instructions (Signed)
Introduction _______________________[name]________________________________ needs to be excused from: X____ Work ____ School ____ Physical activity beginning now and through the following date: _1/16/18_______________. He or she may return to work or school but should still avoid the following physical activity or activities from now until ________________. Activity restrictions include: ____ Lifting more than _______ lb ____ Sitting longer than __________ minutes at a time ____ Standing longer than ________ minutes at a time ____ He or she may return to full physical activity as of ________________. Health Care Provider Name (printed): ____DR Harrell Gave DICKSON____________________________________ Health Care Provider (signature): ___________________________________________ Date: ________________ This information is not intended to replace advice given to you by your health care provider. Make sure you discuss any questions you have with your health care provider. Document Released: 06/16/2000 Document Revised: 07/11/2015 Document Reviewed: 07/23/2013  2017 Elsevier         Fistulogram, Care After Introduction Refer to this sheet in the next few weeks. These instructions provide you with information on caring for yourself after your procedure. Your health care provider may also give you more specific instructions. Your treatment has been planned according to current medical practices, but problems sometimes occur. Call your health care provider if you have any problems or questions after your procedure. What can I expect after the procedure? After your procedure, it is typical to have the following:  A small amount of discomfort in the area where the catheters were placed.  A small amount of bruising around the fistula.  Sleepiness and fatigue. Follow these instructions at home:  Rest at home for the day following your procedure.  Do not drive or operate heavy  machinery while taking pain medicine.  Take medicines only as directed by your health care provider.  Do not take baths, swim, or use a hot tub until your health care provider approves. You may shower 24 hours after the procedure or as directed by your health care provider.  There are many different ways to close and cover an incision, including stitches, skin glue, and adhesive strips. Follow your health care provider's instructions on:  Incision care.  Bandage (dressing) changes and removal.  Incision closure removal.  Monitor your dialysis fistula carefully. Contact a health care provider if:  You have drainage, redness, swelling, or pain at your catheter site.  You have a fever.  You have chills. Get help right away if:  You feel weak.  You have trouble balancing.  You have trouble moving your arms or legs.  You have problems with your speech or vision.  You can no longer feel a vibration or buzz when you put your fingers over your dialysis fistula.  The limb that was used for the procedure:  Swells.  Is painful.  Is cold.  Is discolored, such as blue or pale white. This information is not intended to replace advice given to you by your health care provider. Make sure you discuss any questions you have with your health care provider. Document Released: 05/07/2013 Document Revised: 05/29/2015 Document Reviewed: 02/09/2013  2017 Elsevier

## 2016-01-19 NOTE — Interval H&P Note (Signed)
History and Physical Interval Note:  01/19/2016 9:39 AM  [name]  has presented today for surgery, with the diagnosis of stge 5 chronic kidney disease  The various methods of treatment have been discussed with the patient and family. After consideration of risks, benefits and other options for treatment, the patient has consented to  Procedure(s): A/V Shuntogram (Right) as a surgical intervention .  The patient's history has been reviewed, patient examined, no change in status, stable for surgery.  I have reviewed the patient's chart and labs.  Questions were answered to the patient's satisfaction.     [name]

## 2016-01-19 NOTE — H&P (View-Only) (Signed)
[facility] [place]  Patient name: [name] MRN: [id number] DOB: Nov 04, 1975 Sex: female  REASON FOR VISIT: evaluate graft, referred by Dr. Lorrene Reid  HPI: [name] is a 41 y.o. female who presents with concerns of a high-pitched thrill over her right upper arm graft. Patient is not yet on hemodialysis. The patient most recently underwent thrombectomy and revision of her right upper arm graft by Dr. Oneida Alar on 10/27/2015. Prior to this, she underwent a shuntogram with percutaneous thrombectomy on 07/28/2015 by Dr. Bridgett Larsson. She has had multiple venoplasties of the venous anastomosis before this. Her right upper arm graft was originally placed on 04/15/2014 by Dr. Bridgett Larsson. She has a history of thrombosed right and left arm fistulas.   She uses a stethoscope at home to listen to her graft. She is concerned that her graft is occluding approximately every 3 months. She wanted to "catch it" before another occlusion occurs. She denies any pain or numbness to her right hand.  Past Medical History:  Diagnosis Date  . Anemia   . Anxiety    pt not aware of this  . Arthritis   . CHF (congestive heart failure) ([place])    hosp. 2011  . Chronic kidney disease    not on dialysis at this time  . Dilated cardiomyopathy ([place])   . Encounter for blood transfusion   . Family history of adverse reaction to anesthesia     " Aunt has a tracheostomy and kept coughing after colonoscopy "  . Foot fracture   . Gout   . Heart murmur    as a child  . History of blood transfusion   . History of pneumonia   . Hyperlipidemia   . Hypertension   . Menorrhagia   . Morbid obesity ([place])   . Pneumonia    hosp. 2010  . Shortness of breath    seldom  . Thyroid disease     Family History  Problem Relation Age of Onset  . Diabetes Mother   . Hyperlipidemia Mother   . Hypertension Mother   . Kidney disease Mother   . Anesthesia problems Neg Hx   . Hypotension Neg Hx   . Malignant  hyperthermia Neg Hx   . Pseudochol deficiency Neg Hx     SOCIAL HISTORY: Social History  Substance Use Topics  . Smoking status: Never Smoker  . Smokeless tobacco: Never Used  . Alcohol use No    Allergies  Allergen Reactions  . No Known Allergies     Current Outpatient Prescriptions  Medication Sig Dispense Refill  . acetaminophen (TYLENOL) 500 MG tablet Take 1,000 mg by mouth every 6 (six) hours as needed (for pain.).    Marland Kitchen allopurinol (ZYLOPRIM) 100 MG tablet Take 200 mg by mouth daily.     . carvedilol (COREG) 25 MG tablet Take 25 mg by mouth 2 (two) times daily.     . ferrous sulfate 325 (65 FE) MG EC tablet Take 1 tablet (325 mg total) by mouth 2 (two) times daily. (Patient taking differently: Take 650 mg by mouth daily with breakfast. ) 60 tablet 1  . furosemide (LASIX) 80 MG tablet Take 80 mg by mouth 2 (two) times daily.    . hydrALAZINE (APRESOLINE) 100 MG tablet Take 100 mg by mouth 3 (three) times daily.     . megestrol (MEGACE) 20 MG tablet Take 1 tablet (20 mg total) by mouth 2 (two) times daily. (Patient taking differently: Take 40  mg by mouth daily. ) 60 tablet 12  . oxyCODONE-acetaminophen (PERCOCET/ROXICET) 5-325 MG tablet Take 1 tablet by mouth every 6 (six) hours as needed. 6 tablet 0  . pravastatin (PRAVACHOL) 40 MG tablet Take 40 mg by mouth daily.     Marland Kitchen PROAIR HFA 108 (90 Base) MCG/ACT inhaler Inhale 1-2 puffs into the lungs 3 (three) times daily as needed. For wheezing.  2  . RENVELA 800 MG tablet Take 1,600 mg by mouth 3 (three) times daily with meals. And snacks     No current facility-administered medications for this visit.    Facility-Administered Medications Ordered in Other Visits  Medication Dose Route Frequency Provider Last Rate Last Dose  . 0.9 %  sodium chloride infusion   Intravenous Continuous Conrad [place], MD        REVIEW OF SYSTEMS:  [X]  denotes positive finding, [ ]  denotes negative finding Cardiac  Comments:  Chest pain or chest  pressure:    Shortness of breath upon exertion:    Short of breath when lying flat:    Irregular heart rhythm:        Vascular    Pain in calf, thigh, or hip brought on by ambulation:    Pain in feet at night that wakes you up from your sleep:     Blood clot in your veins:    Leg swelling:         Pulmonary    Oxygen at home:    Productive cough:     Wheezing:         Neurologic    Sudden weakness in arms or legs:     Sudden numbness in arms or legs:     Sudden onset of difficulty speaking or slurred speech:    Temporary loss of vision in one eye:     Problems with dizziness:         Gastrointestinal    Blood in stool:     Vomited blood:         Genitourinary    Burning when urinating:     Blood in urine:        Psychiatric    Major depression:         Hematologic    Bleeding problems:    Problems with blood clotting too easily:        Skin    Rashes or ulcers:        Constitutional    Fever or chills:      PHYSICAL EXAM: Vitals:   01/14/16 1346  BP: (!) 146/91  Pulse: 74  Resp: 20  Temp: 97.7 F (36.5 C)  TempSrc: Oral  SpO2: 96%  Weight: (!) 317 lb (143.8 kg)  Height: 5' 9.5" (1.765 m)    GENERAL: The patient is a well-nourished obese female, in no acute distress. The vital signs are documented above. VASCULAR: Palpable thrill and audible bruit right upper arm graft. PULMONARY: Nonlabored respiratory effort. MUSCULOSKELETAL: There are no major deformities or cyanosis. NEUROLOGIC: No focal weakness or paresthesias are detected. SKIN: There are no ulcers or rashes noted. PSYCHIATRIC: The patient has a normal affect.  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 However, given history of recurrent thrombosis of her right upper arm graft, we will perform a shuntogram for further evaluation. Dr. Scot Dock did see and evaluate patient. She is not on dialysis yet, therefore we will  limit the amount  of dye administered. She'll be scheduled for a right arm shuntoogram with possible intervention on 01/19/2016 with Dr. Scot Dock.    Virgina Jock, PA-C [facility] MD: Scot Dock

## 2016-01-19 NOTE — Op Note (Signed)
   PATIENT: [name]  MRN: [id number] DOB: [birth year]    DATE OF PROCEDURE: 01/19/2016  INDICATIONS: [name] is a 41 y.o. female who had multiple attempts at a fistula in the right arm that were not successful and ultimately she had a right upper arm AV graft placed. She is not on dialysis. However, the graft is been clotting frequently she's had a surgical revision and also undergone thrombolysis. Given the frequent graft thromboses she was evaluated for possible revision of her graft. She presents for fistulogram.  PROCEDURE:  1. Fistulogram right upper arm AV fistula  SURGEON: Judeth Cornfield. Scot Dock, MD, FACS  ANESTHESIA: Local   EBL: Minimal  TECHNIQUE: The patient was taken to the peripheral vascular lab. The right arm was prepped and draped in usual sterile fashion. Under ultrasound guidance, the graft was cannulated with a micropuncture needle and a micropuncture sheath introduced over wire. Fischer gram wasn't obtained to evaluate the fistula up to the shoulder. In order to limit contrast I did not evaluate the central veins as was no pulsatility to suggest an outflow obstruction. We then compressed the fistula for reflux shot which demonstrated the arterial anastomosis and proximal graft.  FINDINGS:  1. The graft is widely patent with no areas of stenosis. 2. The proximal several centimeters of graft is small however and I suspect this is the cause for the high-pitched bruit on exam and the problems with multiple graft thromboses.  CLINICAL NOTE: I have recommended revision of her fistula by replacing the proximal segment with a larger segment of graft.  [name], MD, FACS [facility]  DATE OF DICTATION:   01/19/2016

## 2016-01-20 ENCOUNTER — Encounter (HOSPITAL_COMMUNITY): Payer: Self-pay | Admitting: Vascular Surgery

## 2016-01-20 ENCOUNTER — Other Ambulatory Visit: Payer: Self-pay | Admitting: *Deleted

## 2016-01-28 ENCOUNTER — Encounter (HOSPITAL_COMMUNITY): Payer: Self-pay | Admitting: *Deleted

## 2016-01-28 ENCOUNTER — Inpatient Hospital Stay (HOSPITAL_COMMUNITY): Admission: RE | Admit: 2016-01-28 | Payer: BLUE CROSS/BLUE SHIELD | Source: Ambulatory Visit

## 2016-01-28 NOTE — Progress Notes (Signed)
Spoke with pt for pre-op call. Pt denies cardiac history, chest pain or sob. Pt is not diabetic. 

## 2016-01-30 ENCOUNTER — Encounter (HOSPITAL_COMMUNITY): Payer: Self-pay | Admitting: *Deleted

## 2016-01-30 ENCOUNTER — Encounter (HOSPITAL_COMMUNITY)
Admission: RE | Disposition: A | Discharge status: Home / Self Care | Payer: Self-pay | Source: Ambulatory Visit | Attending: Vascular Surgery

## 2016-01-30 ENCOUNTER — Ambulatory Visit (HOSPITAL_COMMUNITY): Payer: BLUE CROSS/BLUE SHIELD | Admitting: Anesthesiology

## 2016-01-30 ENCOUNTER — Ambulatory Visit (HOSPITAL_COMMUNITY)
Admission: RE | Admit: 2016-01-30 | Discharge: 2016-01-30 | Disposition: A | Discharge status: Home / Self Care | Payer: BLUE CROSS/BLUE SHIELD | Source: Ambulatory Visit | Attending: Vascular Surgery | Admitting: Vascular Surgery

## 2016-01-30 DIAGNOSIS — I739 Peripheral vascular disease, unspecified: Secondary | ICD-10-CM | POA: Diagnosis not present

## 2016-01-30 DIAGNOSIS — Z6841 Body Mass Index (BMI) 40.0 and over, adult: Secondary | ICD-10-CM | POA: Diagnosis not present

## 2016-01-30 DIAGNOSIS — N185 Chronic kidney disease, stage 5: Secondary | ICD-10-CM | POA: Insufficient documentation

## 2016-01-30 DIAGNOSIS — Z79899 Other long term (current) drug therapy: Secondary | ICD-10-CM | POA: Insufficient documentation

## 2016-01-30 DIAGNOSIS — M109 Gout, unspecified: Secondary | ICD-10-CM | POA: Insufficient documentation

## 2016-01-30 DIAGNOSIS — T82858A Stenosis of vascular prosthetic devices, implants and grafts, initial encounter: Secondary | ICD-10-CM | POA: Diagnosis not present

## 2016-01-30 DIAGNOSIS — I132 Hypertensive heart and chronic kidney disease with heart failure and with stage 5 chronic kidney disease, or end stage renal disease: Secondary | ICD-10-CM | POA: Diagnosis not present

## 2016-01-30 DIAGNOSIS — N921 Excessive and frequent menstruation with irregular cycle: Secondary | ICD-10-CM

## 2016-01-30 DIAGNOSIS — I509 Heart failure, unspecified: Secondary | ICD-10-CM | POA: Insufficient documentation

## 2016-01-30 DIAGNOSIS — Y832 Surgical operation with anastomosis, bypass or graft as the cause of abnormal reaction of the patient, or of later complication, without mention of misadventure at the time of the procedure: Secondary | ICD-10-CM | POA: Insufficient documentation

## 2016-01-30 DIAGNOSIS — I420 Dilated cardiomyopathy: Secondary | ICD-10-CM | POA: Diagnosis not present

## 2016-01-30 DIAGNOSIS — E785 Hyperlipidemia, unspecified: Secondary | ICD-10-CM | POA: Diagnosis not present

## 2016-01-30 DIAGNOSIS — N184 Chronic kidney disease, stage 4 (severe): Secondary | ICD-10-CM | POA: Diagnosis not present

## 2016-01-30 DIAGNOSIS — T82898A Other specified complication of vascular prosthetic devices, implants and grafts, initial encounter: Secondary | ICD-10-CM | POA: Diagnosis not present

## 2016-01-30 DIAGNOSIS — Z79818 Long term (current) use of other agents affecting estrogen receptors and estrogen levels: Secondary | ICD-10-CM | POA: Insufficient documentation

## 2016-01-30 HISTORY — PX: REVISION OF ARTERIOVENOUS GORETEX GRAFT: SHX6073

## 2016-01-30 LAB — POCT I-STAT 4, (NA,K, GLUC, HGB,HCT)
GLUCOSE: 81 mg/dL (ref 65–99)
HEMATOCRIT: 33 % — AB (ref 36.0–46.0)
HEMOGLOBIN: 11.2 g/dL — AB (ref 12.0–15.0)
Potassium: 4.2 mmol/L (ref 3.5–5.1)
Sodium: 141 mmol/L (ref 135–145)

## 2016-01-30 SURGERY — REVISION OF ARTERIOVENOUS GORETEX GRAFT
Anesthesia: General | Site: Arm Upper | Laterality: Right

## 2016-01-30 MED ORDER — LIDOCAINE-EPINEPHRINE (PF) 1 %-1:200000 IJ SOLN
INTRAMUSCULAR | Status: AC
  Filled 2016-01-30: qty 30

## 2016-01-30 MED ORDER — DEXTROSE 5 % IV SOLN
1.5000 g | INTRAVENOUS | Status: AC
  Administered 2016-01-30: 1.5 g via INTRAVENOUS
  Filled 2016-01-30: qty 1.5

## 2016-01-30 MED ORDER — PROTAMINE SULFATE 10 MG/ML IV SOLN
INTRAVENOUS | Status: AC
  Filled 2016-01-30: qty 5

## 2016-01-30 MED ORDER — SODIUM CHLORIDE 0.9 % IV SOLN
INTRAVENOUS | Status: DC | PRN
  Administered 2016-01-30: 13:00:00

## 2016-01-30 MED ORDER — SUCCINYLCHOLINE CHLORIDE 20 MG/ML IJ SOLN
INTRAMUSCULAR | Status: DC | PRN
  Administered 2016-01-30: 100 mg via INTRAVENOUS

## 2016-01-30 MED ORDER — ROCURONIUM BROMIDE 100 MG/10ML IV SOLN
INTRAVENOUS | Status: DC | PRN
  Administered 2016-01-30: 10 mg via INTRAVENOUS

## 2016-01-30 MED ORDER — OXYCODONE-ACETAMINOPHEN 5-325 MG PO TABS: 1.0000 | ORAL_TABLET | Freq: Four times a day (QID) | ORAL | 0 refills | Status: DC | PRN

## 2016-01-30 MED ORDER — GLYCOPYRROLATE 0.2 MG/ML IJ SOLN
INTRAMUSCULAR | Status: DC | PRN
  Administered 2016-01-30: 0.6 mg via INTRAVENOUS

## 2016-01-30 MED ORDER — MIDAZOLAM HCL 2 MG/2ML IJ SOLN
INTRAMUSCULAR | Status: AC
  Filled 2016-01-30: qty 2

## 2016-01-30 MED ORDER — HEPARIN SODIUM (PORCINE) 1000 UNIT/ML IJ SOLN
INTRAMUSCULAR | Status: DC | PRN
  Administered 2016-01-30: 10000 [IU] via INTRAVENOUS

## 2016-01-30 MED ORDER — PROTAMINE SULFATE 10 MG/ML IV SOLN
INTRAVENOUS | Status: DC | PRN
  Administered 2016-01-30: 30 mg via INTRAVENOUS
  Administered 2016-01-30: 20 mg via INTRAVENOUS

## 2016-01-30 MED ORDER — ONDANSETRON HCL 4 MG/2ML IJ SOLN
INTRAMUSCULAR | Status: DC | PRN
  Administered 2016-01-30: 4 mg via INTRAVENOUS

## 2016-01-30 MED ORDER — EPHEDRINE SULFATE 50 MG/ML IJ SOLN
INTRAMUSCULAR | Status: DC | PRN
  Administered 2016-01-30: 5 mg via INTRAVENOUS

## 2016-01-30 MED ORDER — SODIUM CHLORIDE 0.9 % IV SOLN
INTRAVENOUS | Status: DC
  Administered 2016-01-30: 08:00:00 via INTRAVENOUS

## 2016-01-30 MED ORDER — LIDOCAINE HCL (CARDIAC) 20 MG/ML IV SOLN
INTRAVENOUS | Status: DC | PRN
  Administered 2016-01-30: 60 mg via INTRAVENOUS

## 2016-01-30 MED ORDER — MEGESTROL ACETATE 20 MG PO TABS: 40.0000 mg | ORAL_TABLET | Freq: Every day | ORAL | Status: DC

## 2016-01-30 MED ORDER — SODIUM CHLORIDE 0.9 % IV SOLN: INTRAVENOUS | Status: DC

## 2016-01-30 MED ORDER — DEXAMETHASONE SODIUM PHOSPHATE 4 MG/ML IJ SOLN
INTRAMUSCULAR | Status: DC | PRN
  Administered 2016-01-30: 10 mg via INTRAVENOUS

## 2016-01-30 MED ORDER — MIDAZOLAM HCL 5 MG/5ML IJ SOLN
INTRAMUSCULAR | Status: DC | PRN
  Administered 2016-01-30: 2 mg via INTRAVENOUS

## 2016-01-30 MED ORDER — NEOSTIGMINE METHYLSULFATE 10 MG/10ML IV SOLN
INTRAVENOUS | Status: DC | PRN
  Administered 2016-01-30: 4 mg via INTRAVENOUS

## 2016-01-30 MED ORDER — FERROUS SULFATE 325 (65 FE) MG PO TBEC: 650.0000 mg | DELAYED_RELEASE_TABLET | Freq: Every day | ORAL | Status: DC

## 2016-01-30 MED ORDER — PHENYLEPHRINE HCL 10 MG/ML IJ SOLN
INTRAMUSCULAR | Status: DC | PRN
  Administered 2016-01-30 (×5): 80 ug via INTRAVENOUS

## 2016-01-30 MED ORDER — 0.9 % SODIUM CHLORIDE (POUR BTL) OPTIME
TOPICAL | Status: DC | PRN
  Administered 2016-01-30: 1000 mL

## 2016-01-30 MED ORDER — PROPOFOL 10 MG/ML IV BOLUS
INTRAVENOUS | Status: DC | PRN
  Administered 2016-01-30: 30 mg via INTRAVENOUS
  Administered 2016-01-30: 200 mg via INTRAVENOUS

## 2016-01-30 MED ORDER — CHLORHEXIDINE GLUCONATE CLOTH 2 % EX PADS: 6.0000 | MEDICATED_PAD | Freq: Once | CUTANEOUS | Status: DC

## 2016-01-30 MED ORDER — PROPOFOL 10 MG/ML IV BOLUS
INTRAVENOUS | Status: AC
  Filled 2016-01-30: qty 20

## 2016-01-30 MED ORDER — PHENYLEPHRINE HCL 10 MG/ML IJ SOLN
INTRAVENOUS | Status: DC | PRN
  Administered 2016-01-30: 40 ug/min via INTRAVENOUS

## 2016-01-30 MED ORDER — FENTANYL CITRATE (PF) 100 MCG/2ML IJ SOLN
INTRAMUSCULAR | Status: DC | PRN
  Administered 2016-01-30 (×2): 50 ug via INTRAVENOUS

## 2016-01-30 MED ORDER — FENTANYL CITRATE (PF) 100 MCG/2ML IJ SOLN
INTRAMUSCULAR | Status: AC
  Filled 2016-01-30: qty 2

## 2016-01-30 MED ORDER — THROMBIN 20000 UNITS EX SOLR
CUTANEOUS | Status: AC
  Filled 2016-01-30: qty 20000

## 2016-01-30 SURGICAL SUPPLY — 34 items
ADH SKN CLS APL DERMABOND .7 (GAUZE/BANDAGES/DRESSINGS) ×1
CANISTER SUCTION 2500CC (MISCELLANEOUS) ×2 IMPLANT
CANNULA VESSEL 3MM 2 BLNT TIP (CANNULA) ×2 IMPLANT
CATH EMB 4FR 40CM (CATHETERS) ×1 IMPLANT
CLIP TI MEDIUM 6 (CLIP) ×2 IMPLANT
CLIP TI WIDE RED SMALL 6 (CLIP) ×2 IMPLANT
DECANTER SPIKE VIAL GLASS SM (MISCELLANEOUS) ×2 IMPLANT
DERMABOND ADVANCED (GAUZE/BANDAGES/DRESSINGS) ×1
DERMABOND ADVANCED .7 DNX12 (GAUZE/BANDAGES/DRESSINGS) ×1 IMPLANT
ELECT REM PT RETURN 9FT ADLT (ELECTROSURGICAL) ×2
ELECTRODE REM PT RTRN 9FT ADLT (ELECTROSURGICAL) ×1 IMPLANT
GLOVE BIO SURGEON STRL SZ 6.5 (GLOVE) ×1 IMPLANT
GLOVE BIO SURGEON STRL SZ7.5 (GLOVE) ×5 IMPLANT
GLOVE BIOGEL PI IND STRL 7.0 (GLOVE) IMPLANT
GLOVE BIOGEL PI IND STRL 8 (GLOVE) ×1 IMPLANT
GLOVE BIOGEL PI INDICATOR 7.0 (GLOVE) ×5
GLOVE BIOGEL PI INDICATOR 8 (GLOVE) ×1
GOWN STRL REUS W/ TWL LRG LVL3 (GOWN DISPOSABLE) ×3 IMPLANT
GOWN STRL REUS W/TWL LRG LVL3 (GOWN DISPOSABLE) ×14
GRAFT GORETEX STRT 4-7X45 (Vascular Products) ×1 IMPLANT
KIT BASIN OR (CUSTOM PROCEDURE TRAY) ×2 IMPLANT
KIT ROOM TURNOVER OR (KITS) ×2 IMPLANT
NDL HYPO 25GX1X1/2 BEV (NEEDLE) ×1 IMPLANT
NEEDLE HYPO 25GX1X1/2 BEV (NEEDLE) ×2 IMPLANT
NS IRRIG 1000ML POUR BTL (IV SOLUTION) ×2 IMPLANT
PACK CV ACCESS (CUSTOM PROCEDURE TRAY) ×2 IMPLANT
PAD ARMBOARD 7.5X6 YLW CONV (MISCELLANEOUS) ×4 IMPLANT
SPONGE SURGIFOAM ABS GEL 100 (HEMOSTASIS) IMPLANT
SUT PROLENE 6 0 BV (SUTURE) ×4 IMPLANT
SUT VIC AB 3-0 SH 27 (SUTURE) ×4
SUT VIC AB 3-0 SH 27X BRD (SUTURE) ×2 IMPLANT
SUT VICRYL 4-0 PS2 18IN ABS (SUTURE) ×4 IMPLANT
UNDERPAD 30X30 (UNDERPADS AND DIAPERS) ×2 IMPLANT
WATER STERILE IRR 1000ML POUR (IV SOLUTION) ×2 IMPLANT

## 2016-01-30 NOTE — Interval H&P Note (Signed)
History and Physical Interval Note:  01/30/2016 11:34 AM  [name]  has presented today for surgery, with the diagnosis of Complication of hemodialysis graft  The various methods of treatment have been discussed with the patient and family. After consideration of risks, benefits and other options for treatment, the patient has consented to  Procedure(s): REVISION OF UPPER ARM ARTERIOVENOUS GORETEX GRAFT (Right) as a surgical intervention .  The patient's history has been reviewed, patient examined, no change in status, stable for surgery.  I have reviewed the patient's chart and labs.  Questions were answered to the patient's satisfaction.     [name]

## 2016-01-30 NOTE — Anesthesia Postprocedure Evaluation (Signed)
Anesthesia Post Note  Patient: [name]  Procedure(s) Performed: Procedure(s) (LRB): REVISION OF RIGHT UPPER ARM ARTERIOVENOUS USING GORETEX GRAFT (Right)  Patient location during evaluation: PACU Anesthesia Type: General Level of consciousness: awake and alert and patient cooperative Pain management: pain level controlled Vital Signs Assessment: post-procedure vital signs reviewed and stable Respiratory status: spontaneous breathing and respiratory function stable Cardiovascular status: stable Anesthetic complications: no       Last Vitals:  Vitals:   01/30/16 1400 01/30/16 1415  BP: 138/79 138/79  Pulse: 80 79  Resp: 20 20  Temp:  36.7 C    Last Pain:  Vitals:   01/30/16 0750  TempSrc: Oral                 Cedric Mcclaine S

## 2016-01-30 NOTE — Discharge Instructions (Signed)
° ° °  01/30/2016 [name] [id number] [birth year]  Surgeon(s): Angelia Mould, MD  Procedure(s): REVISION OF RIGHT UPPER ARM ARTERIOVENOUS USING GORETEX GRAFT   May stick graft immediately   May stick graft on designated area only:   x Do not stick graft for 4 weeks

## 2016-01-30 NOTE — Transfer of Care (Signed)
Immediate Anesthesia Transfer of Care Note  Patient: [name]  Procedure(s) Performed: Procedure(s): REVISION OF RIGHT UPPER ARM ARTERIOVENOUS USING GORETEX GRAFT (Right)  Patient Location: PACU  Anesthesia Type:General  Level of Consciousness: awake and alert   Airway & Oxygen Therapy: Patient Spontanous Breathing and Patient connected to nasal cannula oxygen  Post-op Assessment: Report given to RN and Post -op Vital signs reviewed and stable  Post vital signs: Reviewed and stable  Last Vitals:  Vitals:   01/30/16 0750 01/30/16 1340  BP: (!) 157/77 127/71  Pulse: 72 83  Resp: 20 18  Temp: 37 C 36.6 C    Last Pain:  Vitals:   01/30/16 0750  TempSrc: Oral      Patients Stated Pain Goal: 3 (69/48/54 6270)  Complications: No apparent anesthesia complications

## 2016-01-30 NOTE — Anesthesia Procedure Notes (Signed)
Procedure Name: Intubation Date/Time: 01/30/2016 12:04 PM Performed by: Ollen Bowl Pre-anesthesia Checklist: Patient identified, Emergency Drugs available, Suction available, Patient being monitored and Timeout performed Patient Re-evaluated:Patient Re-evaluated prior to inductionOxygen Delivery Method: Circle system utilized and Simple face mask Preoxygenation: Pre-oxygenation with 100% oxygen Intubation Type: IV induction Ventilation: Mask ventilation without difficulty Laryngoscope Size: Miller and 3 Grade View: Grade I Tube type: Oral Tube size: 7.5 mm Number of attempts: 1 Airway Equipment and Method: Patient positioned with wedge pillow and Stylet Placement Confirmation: ETT inserted through vocal cords under direct vision,  positive ETCO2 and breath sounds checked- equal and bilateral Secured at: 22 cm Tube secured with: Tape Dental Injury: Teeth and Oropharynx as per pre-operative assessment

## 2016-01-30 NOTE — H&P (View-Only) (Signed)
[facility]  Patient name: [name] MRN: [id number] DOB: 06/26/1975 Sex: female  REASON FOR VISIT: evaluate graft, referred by Dr. Lorrene Reid  HPI: [name] is a 41 y.o. female who presents with concerns of a high-pitched thrill over her right upper arm graft. Patient is not yet on hemodialysis. The patient most recently underwent thrombectomy and revision of her right upper arm graft by Dr. Oneida Alar on 10/27/2015. Prior to this, she underwent a shuntogram with percutaneous thrombectomy on 07/28/2015 by Dr. Bridgett Larsson. She has had multiple venoplasties of the venous anastomosis before this. Her right upper arm graft was originally placed on 04/15/2014 by Dr. Bridgett Larsson. She has a history of thrombosed right and left arm fistulas.   She uses a stethoscope at home to listen to her graft. She is concerned that her graft is occluding approximately every 3 months. She wanted to "catch it" before another occlusion occurs. She denies any pain or numbness to her right hand.  Past Medical History:  Diagnosis Date  . Anemia   . Anxiety    pt not aware of this  . Arthritis   . CHF (congestive heart failure) ([place])    hosp. 2011  . Chronic kidney disease    not on dialysis at this time  . Dilated cardiomyopathy ([place])   . Encounter for blood transfusion   . Family history of adverse reaction to anesthesia     " Aunt has a tracheostomy and kept coughing after colonoscopy "  . Foot fracture   . Gout   . Heart murmur    as a child  . History of blood transfusion   . History of pneumonia   . Hyperlipidemia   . Hypertension   . Menorrhagia   . Morbid obesity ([place])   . Pneumonia    hosp. 2010  . Shortness of breath    seldom  . Thyroid disease     Family History  Problem Relation Age of Onset  . Diabetes Mother   . Hyperlipidemia Mother   . Hypertension Mother   . Kidney disease Mother   . Anesthesia problems Neg Hx   . Hypotension Neg Hx   . Malignant  hyperthermia Neg Hx   . Pseudochol deficiency Neg Hx     SOCIAL HISTORY: Social History  Substance Use Topics  . Smoking status: Never Smoker  . Smokeless tobacco: Never Used  . Alcohol use No    Allergies  Allergen Reactions  . No Known Allergies     Current Outpatient Prescriptions  Medication Sig Dispense Refill  . acetaminophen (TYLENOL) 500 MG tablet Take 1,000 mg by mouth every 6 (six) hours as needed (for pain.).    Marland Kitchen allopurinol (ZYLOPRIM) 100 MG tablet Take 200 mg by mouth daily.     . carvedilol (COREG) 25 MG tablet Take 25 mg by mouth 2 (two) times daily.     . ferrous sulfate 325 (65 FE) MG EC tablet Take 1 tablet (325 mg total) by mouth 2 (two) times daily. (Patient taking differently: Take 650 mg by mouth daily with breakfast. ) 60 tablet 1  . furosemide (LASIX) 80 MG tablet Take 80 mg by mouth 2 (two) times daily.    . hydrALAZINE (APRESOLINE) 100 MG tablet Take 100 mg by mouth 3 (three) times daily.     . megestrol (MEGACE) 20 MG tablet Take 1 tablet (20 mg total) by mouth 2 (two) times daily. (Patient taking differently: Take 40  mg by mouth daily. ) 60 tablet 12  . oxyCODONE-acetaminophen (PERCOCET/ROXICET) 5-325 MG tablet Take 1 tablet by mouth every 6 (six) hours as needed. 6 tablet 0  . pravastatin (PRAVACHOL) 40 MG tablet Take 40 mg by mouth daily.     Marland Kitchen PROAIR HFA 108 (90 Base) MCG/ACT inhaler Inhale 1-2 puffs into the lungs 3 (three) times daily as needed. For wheezing.  2  . RENVELA 800 MG tablet Take 1,600 mg by mouth 3 (three) times daily with meals. And snacks     No current facility-administered medications for this visit.    Facility-Administered Medications Ordered in Other Visits  Medication Dose Route Frequency Provider Last Rate Last Dose  . 0.9 %  sodium chloride infusion   Intravenous Continuous Conrad Sherman, MD        REVIEW OF SYSTEMS:  [X]  denotes positive finding, [ ]  denotes negative finding Cardiac  Comments:  Chest pain or chest  pressure:    Shortness of breath upon exertion:    Short of breath when lying flat:    Irregular heart rhythm:        Vascular    Pain in calf, thigh, or hip brought on by ambulation:    Pain in feet at night that wakes you up from your sleep:     Blood clot in your veins:    Leg swelling:         Pulmonary    Oxygen at home:    Productive cough:     Wheezing:         Neurologic    Sudden weakness in arms or legs:     Sudden numbness in arms or legs:     Sudden onset of difficulty speaking or slurred speech:    Temporary loss of vision in one eye:     Problems with dizziness:         Gastrointestinal    Blood in stool:     Vomited blood:         Genitourinary    Burning when urinating:     Blood in urine:        Psychiatric    Major depression:         Hematologic    Bleeding problems:    Problems with blood clotting too easily:        Skin    Rashes or ulcers:        Constitutional    Fever or chills:      PHYSICAL EXAM: Vitals:   01/14/16 1346  BP: (!) 146/91  Pulse: 74  Resp: 20  Temp: 97.7 F (36.5 C)  TempSrc: Oral  SpO2: 96%  Weight: (!) 317 lb (143.8 kg)  Height: 5' 9.5" (1.765 m)    GENERAL: The patient is a well-nourished obese female, in no acute distress. The vital signs are documented above. VASCULAR: Palpable thrill and audible bruit right upper arm graft. PULMONARY: Nonlabored respiratory effort. MUSCULOSKELETAL: There are no major deformities or cyanosis. NEUROLOGIC: No focal weakness or paresthesias are detected. SKIN: There are no ulcers or rashes noted. PSYCHIATRIC: The patient has a normal affect.  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 However, given history of recurrent thrombosis of her right upper arm graft, we will perform a shuntogram for further evaluation. Dr. Scot Dock did see and evaluate patient. She is not on dialysis yet, therefore we will  limit the amount  of dye administered. She'll be scheduled for a right arm shuntoogram with possible intervention on 01/19/2016 with Dr. Scot Dock.    Virgina Jock, PA-C [facility] MD: Scot Dock

## 2016-01-30 NOTE — Anesthesia Preprocedure Evaluation (Signed)
Anesthesia Evaluation  Patient identified by MRN, date of birth, ID band Patient awake    Reviewed: Allergy & Precautions, NPO status , Patient's Chart, lab work & pertinent test results  History of Anesthesia Complications Negative for: history of anesthetic complications  Airway Mallampati: II  TM Distance: >3 FB Neck ROM: Full    Dental  (+) Teeth Intact, Dental Advisory Given, Chipped   Pulmonary shortness of breath, asthma (last inhaler use a week ago) ,    breath sounds clear to auscultation       Cardiovascular hypertension, Pt. on medications and Pt. on home beta blockers (-) angina+ Peripheral Vascular Disease and +CHF   Rhythm:Regular Rate:Normal  2015 Echo 55%, no sig valve abnormalities   Neuro/Psych Anxiety negative neurological ROS  negative psych ROS   GI/Hepatic negative GI ROS, Neg liver ROS,   Endo/Other  Morbid obesity  Renal/GU ESRFRenal disease (no dialysis yet, K+ 5.0)     Musculoskeletal  (+) Arthritis ,   Abdominal (+) + obese,   Peds  Hematology  (+) anemia ,   Anesthesia Other Findings   Reproductive/Obstetrics                             Anesthesia Physical  Anesthesia Plan  ASA: III  Anesthesia Plan: General   Post-op Pain Management:    Induction: Intravenous  Airway Management Planned: LMA  Additional Equipment:   Intra-op Plan:   Post-operative Plan:   Informed Consent: I have reviewed the patients History and Physical, chart, labs and discussed the procedure including the risks, benefits and alternatives for the proposed anesthesia with the patient or authorized representative who has indicated his/her understanding and acceptance.   Dental advisory given  Plan Discussed with: CRNA, Anesthesiologist and Surgeon  Anesthesia Plan Comments:         Anesthesia Quick Evaluation

## 2016-01-30 NOTE — Op Note (Signed)
    NAME: [name]   MRN: [id number] DOB: [birth year]    DATE OF OPERATION: 01/30/2016  PREOP DIAGNOSIS: Stage IV chronic kidney disease  POSTOP DIAGNOSIS: Same  PROCEDURE: Revision of right upper arm AV graft  SURGEON: Judeth Cornfield. Scot Dock, MD, FACS  ASSIST: Silva Bandy, [facility]  ANESTHESIA: Gen.   EBL: Minimal  INDICATIONS: [name] is a 41 y.o. female who is not yet on dialysis. She had a weak thrill in her fistula and fistulogram showed that the proximal segment of the graft was narrowed. She presents for replacement of the proximal segment with a larger segment of graft.  FINDINGS: Much improved thrill in the graft after the revision.  TECHNIQUE: The patient was taken to the operating room and received a general anesthetic. The right upper extremity was prepped and draped in usual sterile fashion. Incision was made over the proximal graft here the graft was dissected free. I also dissected proximal and distal to the arterial anastomosis. The patient was then heparinized. The brachial artery proximal and distal to the anastomosis was clamped. The graft was clamped in its larger segment. The narrowed segment of graft was excised. The graft was removed entirely from the brachial artery. A new segment of 4-7 mm PTFE graft was brought to the field. A segment of the narrow end of the graft was excised. This graft was then sewn end to side to the brachial artery using continuous 6-0 Prolene suture. The graft was then spatulated and sewn into and to the old graft using continuous 6-0 Prolene suture. At the completion was a much better thrill in the graft. Hemostasis was obtained in the wound. I did remove a large amount of adipose tissue as the graft was somewhat deep. The wound was irrigated and hemostasis obtained. The wound was closed with a pair of 3-0 Vicryl the skin closed with 4-0 Vicryl. Sterile dressing was applied. The patient tolerated the procedure well and was transferred to  the recovery room in stable condition. All needle and sponge counts were correct.  [name], MD, FACS [facility]  DATE OF DICTATION:   01/30/2016

## 2016-02-02 ENCOUNTER — Encounter (HOSPITAL_COMMUNITY): Payer: Self-pay | Admitting: Vascular Surgery

## 2016-02-05 ENCOUNTER — Ambulatory Visit (INDEPENDENT_AMBULATORY_CARE_PROVIDER_SITE_OTHER): Payer: Self-pay | Admitting: Vascular Surgery

## 2016-02-05 ENCOUNTER — Telehealth: Payer: Self-pay

## 2016-02-05 ENCOUNTER — Encounter: Payer: Self-pay | Admitting: Vascular Surgery

## 2016-02-05 VITALS — BP 147/82 | HR 79 | Temp 99.6°F | Ht 69.5 in | Wt 317.0 lb

## 2016-02-05 DIAGNOSIS — Z992 Dependence on renal dialysis: Secondary | ICD-10-CM

## 2016-02-05 DIAGNOSIS — N186 End stage renal disease: Secondary | ICD-10-CM

## 2016-02-05 NOTE — Telephone Encounter (Signed)
Pt. reported swelling above the right arm AVG site.  Stated she returned to work the day after surgery, and has tried to avoid any heavy lifting, but has kept her arm very mobile to avoid letting it get stiff.  Described the raised area is "firm and about baseball-sized, and sore"; it looks like a big muscle on my arm."  Stated "I can hear a faint thrill".  Denied fever/ chills.  Reported incision is intact.  Advised to come to appt. At 11:15 today for evaluation, due to the significant size of swelling.  Pt. Agreed.

## 2016-02-05 NOTE — Progress Notes (Signed)
Patient is a 41 year old female who presented today for follow-up after recent graft revision by Dr. Scot Dock approximate one week ago. She noticed swelling in her right arm yesterday which she described as about the size of a baseball. She states the swelling has improved significantly today. She denies any fever or chills. She has no incisional drainage. She states the swelling is much less than it was yesterday.  Physical exam:  Vitals:   02/05/16 1151  BP: (!) 147/82  Pulse: 79  Temp: 99.6 F (37.6 C)  TempSrc: Oral  SpO2: 96%  Weight: (!) 317 lb (143.8 kg)  Height: 5' 9.5" (1.765 m)    Right upper extremity: Healing antecubital incision. Firm area over the proximal graft no focal hematoma. Obese upper arm. Audible bruit and graft  Assessment: Most likely Gore-Tex reaction proximal portion of the graft. This could also represent early hematoma but there is not really anything focal in her do not believe this currently needs evacuation.  Plan: Instructed patient to continue to observe and elevate the arm if she has any drainage fever or chills or redness she will call us otherwise she will follow-up with Dr. Scot Dock in 2 weeks  Ruta Hinds, MD [facility] Office: [phone] Pager: [phone]

## 2016-02-09 ENCOUNTER — Encounter (HOSPITAL_COMMUNITY)
Admission: RE | Admit: 2016-02-09 | Discharge: 2016-02-09 | Disposition: A | Discharge status: Home / Self Care | Payer: BLUE CROSS/BLUE SHIELD | Source: Ambulatory Visit | Attending: Nephrology | Admitting: Nephrology

## 2016-02-09 DIAGNOSIS — Z79899 Other long term (current) drug therapy: Secondary | ICD-10-CM | POA: Diagnosis not present

## 2016-02-09 DIAGNOSIS — N185 Chronic kidney disease, stage 5: Secondary | ICD-10-CM | POA: Diagnosis not present

## 2016-02-09 DIAGNOSIS — D631 Anemia in chronic kidney disease: Secondary | ICD-10-CM | POA: Diagnosis not present

## 2016-02-09 DIAGNOSIS — Z5181 Encounter for therapeutic drug level monitoring: Secondary | ICD-10-CM | POA: Diagnosis not present

## 2016-02-09 LAB — POCT HEMOGLOBIN-HEMACUE: Hemoglobin: 10.8 g/dL — ABNORMAL LOW (ref 12.0–15.0)

## 2016-02-09 LAB — IRON AND TIBC
Iron: 52 ug/dL (ref 28–170)
SATURATION RATIOS: 19 % (ref 10.4–31.8)
TIBC: 272 ug/dL (ref 250–450)
UIBC: 220 ug/dL

## 2016-02-09 LAB — FERRITIN: FERRITIN: 559 ng/mL — AB (ref 11–307)

## 2016-02-09 MED ORDER — DARBEPOETIN ALFA 100 MCG/0.5ML IJ SOSY
PREFILLED_SYRINGE | INTRAMUSCULAR | Status: AC
  Filled 2016-02-09: qty 0.5

## 2016-02-09 MED ORDER — DARBEPOETIN ALFA 100 MCG/0.5ML IJ SOSY
100.0000 ug | PREFILLED_SYRINGE | INTRAMUSCULAR | Status: DC
  Administered 2016-02-09: 100 ug via SUBCUTANEOUS

## 2016-02-12 ENCOUNTER — Encounter: Payer: Self-pay | Admitting: Vascular Surgery

## 2016-02-18 ENCOUNTER — Encounter: Payer: Self-pay | Admitting: Vascular Surgery

## 2016-02-18 ENCOUNTER — Ambulatory Visit (INDEPENDENT_AMBULATORY_CARE_PROVIDER_SITE_OTHER): Payer: Self-pay | Admitting: Vascular Surgery

## 2016-02-18 VITALS — BP 145/82 | HR 77 | Temp 97.9°F | Resp 18 | Ht 69.5 in | Wt 314.6 lb

## 2016-02-18 DIAGNOSIS — Z992 Dependence on renal dialysis: Secondary | ICD-10-CM

## 2016-02-18 DIAGNOSIS — N186 End stage renal disease: Secondary | ICD-10-CM

## 2016-02-18 NOTE — Progress Notes (Signed)
  POST OPERATIVE OFFICE NOTE    CC:  F/u for surgery  HPI:  This is a 41 y.o. female who is s/p revision of RUA AVF by Dr. Scot Dock on 01/30/16 for a weak thrill in her fistula and fistulogram showed that the proximal segment of the graft was narrowed.  She states that she doesn't have any complaints except her incision is itching.  She denies any pain or numbness in her hand.    Her original graft was placed by Dr. Bridgett Larsson on 04/15/14.  She has undergone several procedures since placement to include thrombectomy and revision of the graft on 10/27/15 by Dr. Oneida Alar.    No Known Allergies  Current Outpatient Prescriptions  Medication Sig Dispense Refill  . acetaminophen (TYLENOL) 500 MG tablet Take 1,000 mg by mouth every 6 (six) hours as needed (for pain.).    Marland Kitchen allopurinol (ZYLOPRIM) 100 MG tablet Take 200 mg by mouth daily.     . carvedilol (COREG) 25 MG tablet Take 25 mg by mouth 2 (two) times daily.     . ferrous sulfate 325 (65 FE) MG EC tablet Take 2 tablets (650 mg total) by mouth daily with breakfast.    . furosemide (LASIX) 80 MG tablet Take 80 mg by mouth 2 (two) times daily.    . hydrALAZINE (APRESOLINE) 100 MG tablet Take 100 mg by mouth 3 (three) times daily.     . megestrol (MEGACE) 20 MG tablet Take 2 tablets (40 mg total) by mouth daily.    Marland Kitchen oxyCODONE-acetaminophen (PERCOCET/ROXICET) 5-325 MG tablet Take 1 tablet by mouth every 6 (six) hours as needed. 6 tablet 0  . pravastatin (PRAVACHOL) 40 MG tablet Take 40 mg by mouth daily.     Marland Kitchen PROAIR HFA 108 (90 Base) MCG/ACT inhaler Inhale 1-2 puffs into the lungs 3 (three) times daily as needed for wheezing or shortness of breath. For wheezing.  2  . RENVELA 800 MG tablet Take 1,600 mg by mouth 3 (three) times daily with meals. And snacks     No current facility-administered medications for this visit.    Facility-Administered Medications Ordered in Other Visits  Medication Dose Route Frequency Provider Last Rate Last Dose  . 0.9 %   sodium chloride infusion   Intravenous Continuous Conrad La Center, MD         ROS:  See HPI  Physical Exam:  Vitals:   02/18/16 1323 02/18/16 1324  BP: (!) 146/82 (!) 145/82  Pulse: 77   Resp: 18   Temp: 97.9 F (36.6 C)     Incision:  Healing nicely. Extremities:  3+ right radial pulse; +thrill/bruit within the graft throughout the graft.  Motor and sensation are in tact.   Assessment/Plan:  This is a 41 y.o. female who is s/p: Revision of RUA AVG by Dr. Scot Dock on 01/30/16  -pt is doing quite well and her incision is healing nicely.  She denies any steal sx. -she is not yet on HD and she does see Dr. Lorrene Reid tomorrow.  I have instructed her that if she does need dialysis to stick above the incision to avoid the new graft material for a couple of months. -she will f/u as needed.    Leontine Locket, PA-C [facility] [phone]  Clinic MD:  Scot Dock

## 2016-03-05 ENCOUNTER — Other Ambulatory Visit (HOSPITAL_COMMUNITY): Payer: Self-pay | Admitting: *Deleted

## 2016-03-08 ENCOUNTER — Encounter (HOSPITAL_COMMUNITY)
Admission: RE | Admit: 2016-03-08 | Discharge: 2016-03-08 | Disposition: A | Discharge status: Home / Self Care | Payer: BLUE CROSS/BLUE SHIELD | Source: Ambulatory Visit | Attending: Nephrology | Admitting: Nephrology

## 2016-03-08 DIAGNOSIS — N185 Chronic kidney disease, stage 5: Secondary | ICD-10-CM | POA: Diagnosis not present

## 2016-03-08 DIAGNOSIS — Z5181 Encounter for therapeutic drug level monitoring: Secondary | ICD-10-CM | POA: Diagnosis not present

## 2016-03-08 DIAGNOSIS — D631 Anemia in chronic kidney disease: Secondary | ICD-10-CM | POA: Insufficient documentation

## 2016-03-08 DIAGNOSIS — Z79899 Other long term (current) drug therapy: Secondary | ICD-10-CM | POA: Diagnosis not present

## 2016-03-08 DIAGNOSIS — N184 Chronic kidney disease, stage 4 (severe): Secondary | ICD-10-CM

## 2016-03-08 LAB — FERRITIN: Ferritin: 466 ng/mL — ABNORMAL HIGH (ref 11–307)

## 2016-03-08 LAB — IRON AND TIBC
Iron: 54 ug/dL (ref 28–170)
SATURATION RATIOS: 21 % (ref 10.4–31.8)
TIBC: 255 ug/dL (ref 250–450)
UIBC: 201 ug/dL

## 2016-03-08 LAB — POCT HEMOGLOBIN-HEMACUE: HEMOGLOBIN: 10.9 g/dL — AB (ref 12.0–15.0)

## 2016-03-08 MED ORDER — DARBEPOETIN ALFA 100 MCG/0.5ML IJ SOSY
PREFILLED_SYRINGE | INTRAMUSCULAR | Status: AC
  Administered 2016-03-08: 100 ug via SUBCUTANEOUS
  Filled 2016-03-08: qty 0.5

## 2016-03-08 MED ORDER — DARBEPOETIN ALFA 100 MCG/0.5ML IJ SOSY
100.0000 ug | PREFILLED_SYRINGE | INTRAMUSCULAR | Status: DC
  Administered 2016-03-08: 100 ug via SUBCUTANEOUS

## 2016-04-05 ENCOUNTER — Encounter (HOSPITAL_COMMUNITY)
Admission: RE | Admit: 2016-04-05 | Discharge: 2016-04-05 | Disposition: A | Discharge status: Home / Self Care | Payer: BLUE CROSS/BLUE SHIELD | Source: Ambulatory Visit | Attending: Nephrology | Admitting: Nephrology

## 2016-04-05 DIAGNOSIS — D631 Anemia in chronic kidney disease: Secondary | ICD-10-CM | POA: Diagnosis not present

## 2016-04-05 DIAGNOSIS — Z79899 Other long term (current) drug therapy: Secondary | ICD-10-CM | POA: Diagnosis not present

## 2016-04-05 DIAGNOSIS — N185 Chronic kidney disease, stage 5: Secondary | ICD-10-CM | POA: Insufficient documentation

## 2016-04-05 DIAGNOSIS — Z5181 Encounter for therapeutic drug level monitoring: Secondary | ICD-10-CM | POA: Diagnosis not present

## 2016-04-05 DIAGNOSIS — N184 Chronic kidney disease, stage 4 (severe): Secondary | ICD-10-CM

## 2016-04-05 LAB — FERRITIN: FERRITIN: 511 ng/mL — AB (ref 11–307)

## 2016-04-05 LAB — POCT HEMOGLOBIN-HEMACUE: Hemoglobin: 10.7 g/dL — ABNORMAL LOW (ref 12.0–15.0)

## 2016-04-05 LAB — IRON AND TIBC
IRON: 72 ug/dL (ref 28–170)
Saturation Ratios: 28 % (ref 10.4–31.8)
TIBC: 259 ug/dL (ref 250–450)
UIBC: 187 ug/dL

## 2016-04-05 MED ORDER — DARBEPOETIN ALFA 100 MCG/0.5ML IJ SOSY
100.0000 ug | PREFILLED_SYRINGE | INTRAMUSCULAR | Status: DC
  Administered 2016-04-05: 100 ug via SUBCUTANEOUS

## 2016-04-05 MED ORDER — DARBEPOETIN ALFA 100 MCG/0.5ML IJ SOSY
PREFILLED_SYRINGE | INTRAMUSCULAR | Status: AC
  Filled 2016-04-05: qty 0.5

## 2016-05-03 ENCOUNTER — Encounter (HOSPITAL_COMMUNITY)
Admission: RE | Admit: 2016-05-03 | Discharge: 2016-05-03 | Disposition: A | Discharge status: Home / Self Care | Payer: BLUE CROSS/BLUE SHIELD | Source: Ambulatory Visit | Attending: Nephrology | Admitting: Nephrology

## 2016-05-03 DIAGNOSIS — N184 Chronic kidney disease, stage 4 (severe): Principal | ICD-10-CM

## 2016-05-03 DIAGNOSIS — N185 Chronic kidney disease, stage 5: Secondary | ICD-10-CM | POA: Diagnosis not present

## 2016-05-03 DIAGNOSIS — D631 Anemia in chronic kidney disease: Secondary | ICD-10-CM

## 2016-05-03 LAB — RENAL FUNCTION PANEL
Albumin: 3.9 g/dL (ref 3.5–5.0)
Anion gap: 8 (ref 5–15)
BUN: 53 mg/dL — AB (ref 6–20)
CHLORIDE: 106 mmol/L (ref 101–111)
CO2: 26 mmol/L (ref 22–32)
Calcium: 9.4 mg/dL (ref 8.9–10.3)
Creatinine, Ser: 5.81 mg/dL — ABNORMAL HIGH (ref 0.44–1.00)
GFR calc Af Amer: 10 mL/min — ABNORMAL LOW (ref >60)
GFR calc non Af Amer: 8 mL/min — ABNORMAL LOW (ref >60)
Glucose, Bld: 85 mg/dL (ref 65–99)
POTASSIUM: 5.1 mmol/L (ref 3.5–5.1)
Phosphorus: 4.4 mg/dL (ref 2.5–4.6)
Sodium: 140 mmol/L (ref 135–145)

## 2016-05-03 LAB — LIPID PANEL
CHOL/HDL RATIO: 2.9 ratio
CHOLESTEROL: 121 mg/dL (ref 0–200)
HDL: 42 mg/dL (ref >40)
LDL CALC: 64 mg/dL (ref 0–99)
TRIGLYCERIDES: 73 mg/dL (ref <150)
VLDL: 15 mg/dL (ref 0–40)

## 2016-05-03 LAB — IRON AND TIBC
Iron: 62 ug/dL (ref 28–170)
Saturation Ratios: 24 % (ref 10.4–31.8)
TIBC: 258 ug/dL (ref 250–450)
UIBC: 196 ug/dL

## 2016-05-03 LAB — POCT HEMOGLOBIN-HEMACUE: Hemoglobin: 10.7 g/dL — ABNORMAL LOW (ref 12.0–15.0)

## 2016-05-03 LAB — FERRITIN: Ferritin: 452 ng/mL — ABNORMAL HIGH (ref 11–307)

## 2016-05-03 MED ORDER — DARBEPOETIN ALFA 100 MCG/0.5ML IJ SOSY
100.0000 ug | PREFILLED_SYRINGE | INTRAMUSCULAR | Status: DC
  Administered 2016-05-03: 100 ug via SUBCUTANEOUS

## 2016-05-03 MED ORDER — DARBEPOETIN ALFA 100 MCG/0.5ML IJ SOSY
PREFILLED_SYRINGE | INTRAMUSCULAR | Status: AC
  Filled 2016-05-03: qty 0.5

## 2016-05-11 ENCOUNTER — Encounter (HOSPITAL_COMMUNITY): Payer: Self-pay

## 2016-05-11 ENCOUNTER — Emergency Department (HOSPITAL_COMMUNITY)
Admission: EM | Admit: 2016-05-11 | Discharge: 2016-05-11 | Disposition: A | Discharge status: Home / Self Care | Payer: BLUE CROSS/BLUE SHIELD | Attending: Emergency Medicine | Admitting: Emergency Medicine

## 2016-05-11 DIAGNOSIS — I5021 Acute systolic (congestive) heart failure: Secondary | ICD-10-CM | POA: Diagnosis not present

## 2016-05-11 DIAGNOSIS — Y9289 Other specified places as the place of occurrence of the external cause: Secondary | ICD-10-CM | POA: Diagnosis not present

## 2016-05-11 DIAGNOSIS — I132 Hypertensive heart and chronic kidney disease with heart failure and with stage 5 chronic kidney disease, or end stage renal disease: Secondary | ICD-10-CM | POA: Insufficient documentation

## 2016-05-11 DIAGNOSIS — Z79899 Other long term (current) drug therapy: Secondary | ICD-10-CM | POA: Insufficient documentation

## 2016-05-11 DIAGNOSIS — S20469A Insect bite (nonvenomous) of unspecified back wall of thorax, initial encounter: Secondary | ICD-10-CM | POA: Insufficient documentation

## 2016-05-11 DIAGNOSIS — Y999 Unspecified external cause status: Secondary | ICD-10-CM | POA: Diagnosis not present

## 2016-05-11 DIAGNOSIS — Y9301 Activity, walking, marching and hiking: Secondary | ICD-10-CM | POA: Diagnosis not present

## 2016-05-11 DIAGNOSIS — W57XXXA Bitten or stung by nonvenomous insect and other nonvenomous arthropods, initial encounter: Secondary | ICD-10-CM | POA: Diagnosis not present

## 2016-05-11 DIAGNOSIS — N185 Chronic kidney disease, stage 5: Secondary | ICD-10-CM | POA: Insufficient documentation

## 2016-05-11 MED ORDER — FAMOTIDINE 20 MG PO TABS
20.0000 mg | ORAL_TABLET | Freq: Once | ORAL | Status: AC
  Administered 2016-05-11: 20 mg via ORAL
  Filled 2016-05-11: qty 1

## 2016-05-11 MED ORDER — DOXYCYCLINE HYCLATE 100 MG PO CAPS: 100.0000 mg | ORAL_CAPSULE | Freq: Two times a day (BID) | ORAL | 0 refills | Status: DC

## 2016-05-11 MED ORDER — FAMOTIDINE 20 MG PO TABS: 20.0000 mg | ORAL_TABLET | Freq: Two times a day (BID) | ORAL | 0 refills | Status: DC

## 2016-05-11 MED ORDER — DOXYCYCLINE HYCLATE 100 MG PO TABS
100.0000 mg | ORAL_TABLET | Freq: Once | ORAL | Status: AC
  Administered 2016-05-11: 100 mg via ORAL
  Filled 2016-05-11: qty 1

## 2016-05-11 NOTE — Discharge Instructions (Signed)
You have been started on doxycycline for presumed tick bite with surrounding superficial cellulitis also been given a prescription for Pepcid.  This is an antihistamine that can help decrease the intensity of the itching. Please make sure to take all of the doxycycline tablets until completed.  Follow-up with Dr. Baird Cancer as needed

## 2016-05-11 NOTE — ED Provider Notes (Signed)
[facility] DEPT Provider Note   CSN: [id number] Arrival date & time: 05/11/16  0037     History   Chief Complaint Chief Complaint  Patient presents with  . Insect Bite    HPI [name] is a 41 y.o. female.  As a 41 year old female who presents with an insect bite to the middle of her back.  She states she noticed an approximate 4 days ago after walking under some trees.  She states the insect was embedded in her skin and she pulled without since that time.  It's been intensely itchy, and she's been scratching causing a small open area.      Past Medical History:  Diagnosis Date  . Anemia   . Anxiety    pt not aware of this  . Arthritis   . CHF (congestive heart failure) ([place])    hosp. 2011  . Chronic kidney disease    not on dialysis at this time  . Dilated cardiomyopathy ([place])   . Encounter for blood transfusion   . Family history of adverse reaction to anesthesia     " Aunt has a tracheostomy and kept coughing after colonoscopy "  . Foot fracture   . Gout   . Heart murmur    as a child  . History of blood transfusion   . History of pneumonia   . Hyperlipidemia   . Hypertension   . Menorrhagia   . Morbid obesity ([place])   . Pneumonia    hosp. 2010  . Shortness of breath    seldom  . Thyroid disease     Patient Active Problem List   Diagnosis Date Noted  . Anemia in chronic kidney disease (CKD) 07/31/2015  . Itching-Right upper arm 05/17/2014  . Leucocytosis 02/28/2014  . Clotted dialysis access [facility])   . Symptomatic anemia 02/27/2014  . Severe anemia 02/27/2014  . Menorrhagia with irregular cycle 02/27/2014  . Essential hypertension 02/27/2014  . 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 Leukocytosis, unspecified 01/28/2013  . Anemia 01/27/2013  . CKD (chronic kidney disease) stage 5, GFR less than 15 ml/min (HCC) 01/27/2013  . Syncope 01/27/2013  . Menorrhagia 01/27/2013  . HYPERLIPIDEMIA-MIXED 04/20/2008  .  OBESITY-MORBID (>100') 04/20/2008  . MITRAL REGURGITATION 04/20/2008  . HYPERTENSION, UNSPECIFIED 04/20/2008  . CARDIOMYOPATHY, DILATED 04/20/2008  . SYSTOLIC HEART FAILURE, ACUTE 04/20/2008  . CHRONIC KIDNEY DISEASE STAGE III (MODERATE) 04/20/2008    Past Surgical History:  Procedure Laterality Date  . ANGIOPLASTY  07/28/2015   Procedure: ANGIOPLASTY OF RIGHT BRACHIAL VEIN;  Surgeon: Conrad Du Quoin, MD;  Location: [place];  Service: Vascular;;  . AV FISTULA PLACEMENT  08/04/2011   Procedure: ARTERIOVENOUS (AV) FISTULA CREATION;  Surgeon: Elam Dutch, MD;  Location: [facility] OR;  Service: Vascular;  Laterality: Right;  Creation right brachiocephalic arteriovenous fistula  . AV FISTULA PLACEMENT Left 02/05/2013   Procedure: ARTERIOVENOUS FISTULA CREATION LEFT ARM;  Surgeon: Conrad Fort Morgan, MD;  Location: [place];  Service: Vascular;  Laterality: Left;  . AV FISTULA PLACEMENT Right 06/04/2013   Procedure: ARTERIOVENOUS (AV) FISTULA CREATION;  Surgeon: Conrad Buffalo Soapstone, MD;  Location: [place];  Service: Vascular;  Laterality: Right;  . AV FISTULA PLACEMENT Right 04/15/2014   Procedure: INSERTION OF ARTERIOVENOUS (AV) GORE-TEX GRAFT ARM;  Surgeon: Conrad Burnham, MD;  Location: [place];  Service: Vascular;  Laterality: Right;  . [place] Right 08/13/2013   Procedure: Second Stage Brachial Vein Transposition;  Surgeon: Aaron Edelman  Starlyn Skeans, MD;  Location: [place];  Service: Vascular;  Laterality: Right;  . CARDIAC SURGERY  1979   repair of hole in heart  . INCISION AND DRAINAGE OF WOUND Right 07/28/2015   Procedure: IRRIGATION AND DEBRIDEMENT RIGHT HAND  WOUND;  Surgeon: Dayna Barker, MD;  Location: [place];  Service: Plastics;  Laterality: Right;  . LIGATION OF ARTERIOVENOUS  FISTULA Left 03/05/2013   Procedure: LIGATION OF ARTERIOVENOUS  FISTULA;  Surgeon: Conrad Marueno, MD;  Location: [place];  Service: Vascular;  Laterality: Left;  . PATCH ANGIOPLASTY Left 03/05/2013   Procedure: PATCH ANGIOPLASTY;  Surgeon: Conrad Bolindale, MD;  Location: [place];  Service: Vascular;  Laterality: Left;  . PERIPHERAL VASCULAR CATHETERIZATION Right 10/31/2014   Procedure: Fistulagram;  Surgeon: Conrad Ford, MD;  Location: [place] CV LAB;  Service: Cardiovascular;  Laterality: Right;  . PERIPHERAL VASCULAR CATHETERIZATION Right 10/31/2014   Procedure: Peripheral Vascular Balloon Angioplasty;  Surgeon: Conrad Basalt, MD;  Location: [place] CV LAB;  Service: Cardiovascular;  Laterality: Right;  pta venous rt arm  . PERIPHERAL VASCULAR CATHETERIZATION N/A 03/13/2015   Procedure: A/V Shuntogram;  Surgeon: Conrad Owenton, MD;  Location: [place] CV LAB;  Service: Cardiovascular;  Laterality: N/A;  . PERIPHERAL VASCULAR CATHETERIZATION Right 03/13/2015   Procedure: Peripheral Vascular Balloon Angioplasty;  Surgeon: Conrad Baytown, MD;  Location: [place] CV LAB;  Service: Cardiovascular;  Laterality: Right;  Shunt  . PERIPHERAL VASCULAR CATHETERIZATION Right 01/19/2016   Procedure: A/V Shuntogram;  Surgeon: Angelia Mould, MD;  Location: [place] CV LAB;  Service: Cardiovascular;  Laterality: Right;  . REVISION OF ARTERIOVENOUS GORETEX GRAFT Right 10/27/2015   Procedure: THROMBECTOMY AND REVISION OF RIGHT UPPER ARM  ARTERIOVENOUS GORETEX GRAFT USING 6MM X 10CM GORE-TEX GRAFT;  Surgeon: Elam Dutch, MD;  Location: [place];  Service: Vascular;  Laterality: Right;  . REVISION OF ARTERIOVENOUS GORETEX GRAFT Right 01/30/2016   Procedure: REVISION OF RIGHT UPPER ARM ARTERIOVENOUS USING GORETEX GRAFT;  Surgeon: Angelia Mould, MD;  Location: [place];  Service: Vascular;  Laterality: Right;  . THROMBECTOMY W/ EMBOLECTOMY Left 03/05/2013   Procedure: THROMBECTOMY OF LEFT BRACHIAL ARTERY;  Surgeon: Conrad Spencer, MD;  Location: [place];  Service: Vascular;  Laterality: Left;  Marland Kitchen VENOGRAM Bilateral 03/25/2014   Procedure: VENOGRAM bilateral;  Surgeon: Conrad , MD;  Location: [facility] CATH LAB;  Service: Cardiovascular;  Laterality:  Bilateral;    OB History    No data available       Home Medications    Prior to Admission medications   Medication Sig Start Date End Date Taking? Authorizing Provider  acetaminophen (TYLENOL) 500 MG tablet Take 1,000 mg by mouth every 6 (six) hours as needed (for pain.).    [provider]  allopurinol (ZYLOPRIM) 100 MG tablet Take 200 mg by mouth daily.     [provider]  carvedilol (COREG) 25 MG tablet Take 25 mg by mouth 2 (two) times daily.     [provider]  doxycycline (VIBRAMYCIN) 100 MG capsule Take 1 capsule (100 mg total) by mouth 2 (two) times daily. 05/11/16   Junius Creamer, NP  famotidine (PEPCID) 20 MG tablet Take 1 tablet (20 mg total) by mouth 2 (two) times daily. 05/11/16   Junius Creamer, NP  ferrous sulfate 325 (65 FE) MG EC tablet Take 2 tablets (650 mg total) by mouth daily with breakfast. 01/30/16   Alvia Grove, PA-C  furosemide (LASIX) 80 MG tablet Take 80 mg by mouth 2 (two) times daily.    [provider]  hydrALAZINE (APRESOLINE) 100 MG tablet Take 100 mg by mouth 3 (three) times daily.  05/30/13   [provider]  megestrol (MEGACE) 20 MG tablet Take 2 tablets (40 mg total) by mouth daily. 01/30/16   Alvia Grove, PA-C  oxyCODONE-acetaminophen (PERCOCET/ROXICET) 5-325 MG tablet Take 1 tablet by mouth every 6 (six) hours as needed. 01/30/16   Alvia Grove, PA-C  pravastatin (PRAVACHOL) 40 MG tablet Take 40 mg by mouth daily.  06/01/13   [provider]  PROAIR HFA 108 (90 Base) MCG/ACT inhaler Inhale 1-2 puffs into the lungs 3 (three) times daily as needed for wheezing or shortness of breath. For wheezing. 09/04/15   [provider]  RENVELA 800 MG tablet Take 1,600 mg by mouth 3 (three) times daily with meals. And snacks 09/04/13   [provider]    Family History Family History  Problem Relation Age of Onset  . Diabetes Mother   . Hyperlipidemia Mother   . Hypertension Mother     . Kidney disease Mother   . Anesthesia problems Neg Hx   . Hypotension Neg Hx   . Malignant hyperthermia Neg Hx   . Pseudochol deficiency Neg Hx     Social History Social History  Substance Use Topics  . Smoking status: Never Smoker  . Smokeless tobacco: Never Used  . Alcohol use No     Allergies   Patient has no known allergies.   Review of Systems Review of Systems  Constitutional: Negative for fever.  Skin: Positive for wound.  All other systems reviewed and are negative.    Physical Exam Updated Vital Signs BP (!) 171/83 (BP Location: Left Arm)   Pulse 82   Temp 97.9 F (36.6 C) (Oral)   Resp 16   Ht 5' 9.5" (1.765 m)   Wt (!) 143.8 kg   SpO2 100%   BMI 46.14 kg/m   Physical Exam  Constitutional: She appears well-developed and well-nourished.  HENT:  Head: Normocephalic.  Eyes: Pupils are equal, round, and reactive to light.  Neck: Normal range of motion.  Cardiovascular: Normal rate.   Pulmonary/Chest: Effort normal.  Musculoskeletal: Normal range of motion.  Neurological: She is alert.  Skin: There is erythema.     2 mm superficially open area surrounded by erythema in a symmetric pattern, more on the left and right.  Minimal induration  Psychiatric: She has a normal mood and affect.  Nursing note and vitals reviewed.    ED Treatments / Results  Labs (all labs ordered are listed, but only abnormal results are displayed) Labs Reviewed - No data to display  EKG  EKG Interpretation None       Radiology No results found.  Procedures Procedures (including critical care time)  Medications Ordered in ED Medications  famotidine (PEPCID) tablet 20 mg (not administered)  doxycycline (VIBRA-TABS) tablet 100 mg (not administered)     Initial Impression / Assessment and Plan / ED Course  I have reviewed the triage vital signs and the nursing notes.  Pertinent labs & imaging results that were available during my care of the patient  were reviewed by me and considered in my medical decision making (see chart for details).      Patient's history and presentation is consistent with a tick bite.  She will be started on doxycycline, which is safe and renal failure  patients.  She will also be given Pepcid for itching.  For his antihistamine benefits  Final Clinical Impressions(s) / ED Diagnoses   Final diagnoses:  Tick bite, initial encounter    New Prescriptions New Prescriptions   DOXYCYCLINE (VIBRAMYCIN) 100 MG CAPSULE    Take 1 capsule (100 mg total) by mouth 2 (two) times daily.   FAMOTIDINE (PEPCID) 20 MG TABLET    Take 1 tablet (20 mg total) by mouth 2 (two) times daily.     Junius Creamer, NP 05/11/16 [id number]    Ezequiel Essex, MD 05/11/16 [phone]

## 2016-05-11 NOTE — ED Triage Notes (Signed)
Pt endorses bug bite to mid upper back x 4 days ago. Pt does not know what bit her. Pt states "the bug was embedded in my skin and I pulled it off but I didn't look at it"

## 2016-05-11 NOTE — ED Notes (Signed)
Pt verbalized understanding of d/c instructions and has no further questions. Pt is stable, A&Ox4, VSS.  

## 2016-06-01 ENCOUNTER — Encounter (HOSPITAL_COMMUNITY)
Admission: RE | Admit: 2016-06-01 | Discharge: 2016-06-01 | Disposition: A | Discharge status: Home / Self Care | Payer: BLUE CROSS/BLUE SHIELD | Source: Ambulatory Visit | Attending: Nephrology | Admitting: Nephrology

## 2016-06-01 DIAGNOSIS — Z79899 Other long term (current) drug therapy: Secondary | ICD-10-CM | POA: Diagnosis not present

## 2016-06-01 DIAGNOSIS — D631 Anemia in chronic kidney disease: Secondary | ICD-10-CM | POA: Diagnosis not present

## 2016-06-01 DIAGNOSIS — N185 Chronic kidney disease, stage 5: Secondary | ICD-10-CM | POA: Diagnosis not present

## 2016-06-01 DIAGNOSIS — N184 Chronic kidney disease, stage 4 (severe): Secondary | ICD-10-CM

## 2016-06-01 DIAGNOSIS — Z5181 Encounter for therapeutic drug level monitoring: Secondary | ICD-10-CM | POA: Diagnosis not present

## 2016-06-01 LAB — POCT HEMOGLOBIN-HEMACUE: Hemoglobin: 10.8 g/dL — ABNORMAL LOW (ref 12.0–15.0)

## 2016-06-01 LAB — IRON AND TIBC
Iron: 70 ug/dL (ref 28–170)
Saturation Ratios: 29 % (ref 10.4–31.8)
TIBC: 244 ug/dL — AB (ref 250–450)
UIBC: 174 ug/dL

## 2016-06-01 LAB — FERRITIN: Ferritin: 471 ng/mL — ABNORMAL HIGH (ref 11–307)

## 2016-06-01 MED ORDER — DARBEPOETIN ALFA 100 MCG/0.5ML IJ SOSY
PREFILLED_SYRINGE | INTRAMUSCULAR | Status: AC
  Filled 2016-06-01: qty 0.5

## 2016-06-01 MED ORDER — DARBEPOETIN ALFA 100 MCG/0.5ML IJ SOSY
100.0000 ug | PREFILLED_SYRINGE | INTRAMUSCULAR | Status: DC
  Administered 2016-06-01: 100 ug via SUBCUTANEOUS

## 2016-06-21 ENCOUNTER — Other Ambulatory Visit: Payer: Self-pay | Admitting: Obstetrics and Gynecology

## 2016-06-21 DIAGNOSIS — N921 Excessive and frequent menstruation with irregular cycle: Secondary | ICD-10-CM

## 2016-06-28 ENCOUNTER — Encounter (HOSPITAL_COMMUNITY)
Admission: RE | Admit: 2016-06-28 | Discharge: 2016-06-28 | Disposition: A | Discharge status: Home / Self Care | Payer: BLUE CROSS/BLUE SHIELD | Source: Ambulatory Visit | Attending: Nephrology | Admitting: Nephrology

## 2016-06-28 DIAGNOSIS — Z79899 Other long term (current) drug therapy: Secondary | ICD-10-CM | POA: Insufficient documentation

## 2016-06-28 DIAGNOSIS — D631 Anemia in chronic kidney disease: Secondary | ICD-10-CM | POA: Insufficient documentation

## 2016-06-28 DIAGNOSIS — N185 Chronic kidney disease, stage 5: Secondary | ICD-10-CM | POA: Diagnosis not present

## 2016-06-28 DIAGNOSIS — Z5181 Encounter for therapeutic drug level monitoring: Secondary | ICD-10-CM | POA: Insufficient documentation

## 2016-06-28 DIAGNOSIS — N184 Chronic kidney disease, stage 4 (severe): Secondary | ICD-10-CM

## 2016-06-28 LAB — FERRITIN: Ferritin: 457 ng/mL — ABNORMAL HIGH (ref 11–307)

## 2016-06-28 LAB — IRON AND TIBC
Iron: 66 ug/dL (ref 28–170)
Saturation Ratios: 27 % (ref 10.4–31.8)
TIBC: 244 ug/dL — ABNORMAL LOW (ref 250–450)
UIBC: 178 ug/dL

## 2016-06-28 MED ORDER — DARBEPOETIN ALFA 100 MCG/0.5ML IJ SOSY
100.0000 ug | PREFILLED_SYRINGE | INTRAMUSCULAR | Status: DC
  Administered 2016-06-28: 100 ug via SUBCUTANEOUS

## 2016-06-28 MED ORDER — DARBEPOETIN ALFA 100 MCG/0.5ML IJ SOSY
PREFILLED_SYRINGE | INTRAMUSCULAR | Status: AC
  Filled 2016-06-28: qty 0.5

## 2016-06-29 LAB — POCT HEMOGLOBIN-HEMACUE: Hemoglobin: 10.6 g/dL — ABNORMAL LOW (ref 12.0–15.0)

## 2016-07-26 ENCOUNTER — Encounter (HOSPITAL_COMMUNITY)
Admission: RE | Admit: 2016-07-26 | Discharge: 2016-07-26 | Disposition: A | Discharge status: Home / Self Care | Payer: BLUE CROSS/BLUE SHIELD | Source: Ambulatory Visit | Attending: Nephrology | Admitting: Nephrology

## 2016-07-26 DIAGNOSIS — D631 Anemia in chronic kidney disease: Secondary | ICD-10-CM | POA: Diagnosis not present

## 2016-07-26 DIAGNOSIS — N185 Chronic kidney disease, stage 5: Secondary | ICD-10-CM | POA: Diagnosis not present

## 2016-07-26 DIAGNOSIS — Z5181 Encounter for therapeutic drug level monitoring: Secondary | ICD-10-CM | POA: Diagnosis not present

## 2016-07-26 DIAGNOSIS — N184 Chronic kidney disease, stage 4 (severe): Secondary | ICD-10-CM

## 2016-07-26 DIAGNOSIS — Z79899 Other long term (current) drug therapy: Secondary | ICD-10-CM | POA: Diagnosis not present

## 2016-07-26 LAB — FERRITIN: FERRITIN: 487 ng/mL — AB (ref 11–307)

## 2016-07-26 LAB — IRON AND TIBC
IRON: 82 ug/dL (ref 28–170)
Saturation Ratios: 32 % — ABNORMAL HIGH (ref 10.4–31.8)
TIBC: 253 ug/dL (ref 250–450)
UIBC: 171 ug/dL

## 2016-07-26 LAB — POCT HEMOGLOBIN-HEMACUE: HEMOGLOBIN: 10.4 g/dL — AB (ref 12.0–15.0)

## 2016-07-26 MED ORDER — DARBEPOETIN ALFA 100 MCG/0.5ML IJ SOSY
PREFILLED_SYRINGE | INTRAMUSCULAR | Status: AC
  Filled 2016-07-26: qty 0.5

## 2016-07-26 MED ORDER — DARBEPOETIN ALFA 100 MCG/0.5ML IJ SOSY
100.0000 ug | PREFILLED_SYRINGE | INTRAMUSCULAR | Status: DC
  Administered 2016-07-26: 100 ug via SUBCUTANEOUS

## 2016-08-11 ENCOUNTER — Ambulatory Visit (INDEPENDENT_AMBULATORY_CARE_PROVIDER_SITE_OTHER): Payer: BLUE CROSS/BLUE SHIELD | Admitting: Advanced Practice Midwife

## 2016-08-11 DIAGNOSIS — Z01419 Encounter for gynecological examination (general) (routine) without abnormal findings: Secondary | ICD-10-CM

## 2016-08-11 DIAGNOSIS — N921 Excessive and frequent menstruation with irregular cycle: Secondary | ICD-10-CM

## 2016-08-11 NOTE — Progress Notes (Signed)
   Subjective:    Patient ID: [name], female    DOB: Jul 18, 1975, 41 y.o.   MRN: [id number]  Gynecologic Exam  The patient's pertinent negatives include no genital itching, genital lesions, genital odor, pelvic pain or vaginal bleeding. The problem has been unchanged. The patient is experiencing no pain. She is not pregnant. Pertinent negatives include no abdominal pain, back pain, chills, constipation, diarrhea or fever. She is not sexually active. She uses abstinence for contraception. Her menstrual history has been irregular.   This is a 41 y.o. female who presents requesting a refill on her Megace.  Had had a long problem of DUB and has done very well on Megace. Has regular followup with her primary physician for her other medical problems.  Last pap was 05/30/15, so does not need one today.    Review of Systems  Constitutional: Negative for chills and fever.  Respiratory: Negative for shortness of breath.   Gastrointestinal: Negative for abdominal pain, constipation and diarrhea.  Genitourinary: Negative for pelvic pain.  Musculoskeletal: Negative for back pain.       Objective:   Physical Exam  Constitutional: She is oriented to person, place, and time. She appears well-developed and well-nourished. No distress.  HENT:  Head: Normocephalic.  Cardiovascular: Normal rate, regular rhythm and normal heart sounds.   Pulmonary/Chest: Effort normal. No respiratory distress. She has no wheezes. She has no rales. She exhibits no mass, no bony tenderness, no edema, no deformity, no swelling and no retraction. Right breast exhibits no inverted nipple, no mass, no nipple discharge, no skin change and no tenderness. Left breast exhibits no inverted nipple, no mass, no nipple discharge, no skin change and no tenderness. Breasts are symmetrical.  Abdominal: Soft. She exhibits no distension. There is no tenderness. There is no rebound and no guarding.  Genitourinary: Vagina normal. No vaginal  discharge found.  Musculoskeletal: Normal range of motion.  Neurological: She is alert and oriented to person, place, and time.  Skin: Skin is warm and dry. She is not diaphoretic.  Psychiatric: She has a normal mood and affect.          Assessment & Plan:  A;  41 y.o. female with history of AUB/menorrhagia, well controlled on Megace       Needs annual mammogram  P:  Will schedule Mammogram when systems activate      Willl schedule pelvic US to measure endometrial stripe, since no bleeding on Megace.  Do not think we need to do an endometrial biopsy, as risk is reduced with Megace       Refilled Megace at current dose        Other routine care by primary doctor

## 2016-08-12 ENCOUNTER — Encounter: Payer: Self-pay | Admitting: Advanced Practice Midwife

## 2016-08-12 NOTE — Patient Instructions (Signed)
Dysfunctional Uterine Bleeding °Dysfunctional uterine bleeding is abnormal bleeding from the uterus. Dysfunctional uterine bleeding includes: °· A period that comes earlier or later than usual. °· A period that is lighter, heavier, or has blood clots. °· Bleeding between periods. °· Skipping one or more periods. °· Bleeding after sexual intercourse. °· Bleeding after menopause. ° °Follow these instructions at home: °Pay attention to any changes in your symptoms. Follow these instructions to help with your condition: °Eating and drinking °· Eat well-balanced meals. Include foods that are high in iron, such as liver, meat, shellfish, green leafy vegetables, and eggs. °· If you become constipated: °? Drink plenty of water. °? Eat fruits and vegetables that are high in water and fiber, such as spinach, carrots, raspberries, apples, and mango. °Medicines °· Take over-the-counter and prescription medicines only as told by your health care provider. °· Do not change medicines without talking with your health care provider. °· Aspirin or medicines that contain aspirin may make the bleeding worse. Do not take those medicines: °? During the week before your period. °? During your period. °· If you were prescribed iron pills, take them as told by your health care provider. Iron pills help to replace iron that your body loses because of this condition. °Activity °· If you need to change your sanitary pad or tampon more than one time every 2 hours: °? Lie in bed with your feet raised (elevated). °? Place a cold pack on your lower abdomen. °? Rest as much as possible until the bleeding stops or slows down. °· Do not try to lose weight until the bleeding has stopped and your blood iron level is back to normal. °Other Instructions °· For two months, write down: °? When your period starts. °? When your period ends. °? When any abnormal bleeding occurs. °? What problems you notice. °· Keep all follow up visits as told by your health  care provider. This is important. °Contact a health care provider if: °· You get light-headed or weak. °· You have nausea and vomiting. °· You cannot eat or drink without vomiting. °· You feel dizzy or have diarrhea while you are taking medicines. °· You are taking birth control pills or hormones, and you want to change them or stop taking them. °Get help right away if: °· You develop a fever or chills. °· You need to change your sanitary pad or tampon more than one time per hour. °· Your bleeding becomes heavier, or your flow contains clots more often. °· You develop pain in your abdomen. °· You lose consciousness. °· You develop a rash. °This information is not intended to replace advice given to you by your health care provider. Make sure you discuss any questions you have with your health care provider. °Document Released: 12/19/1999 Document Revised: 05/29/2015 Document Reviewed: 03/18/2014 °Elsevier Interactive Patient Education © 2018 [employer] ° °

## 2016-08-12 NOTE — Progress Notes (Signed)
Patient was here on 08/11/16 for a GYN visit. EPIC went down All notes and vitals were documented on 08/12/16

## 2016-08-18 ENCOUNTER — Other Ambulatory Visit: Payer: Self-pay | Admitting: Advanced Practice Midwife

## 2016-08-18 ENCOUNTER — Telehealth: Payer: Self-pay | Admitting: *Deleted

## 2016-08-18 DIAGNOSIS — Z1231 Encounter for screening mammogram for malignant neoplasm of breast: Secondary | ICD-10-CM

## 2016-08-18 NOTE — Telephone Encounter (Signed)
-----   Message from Seabron Spates, CNM sent at 08/12/2016  4:16 PM EDT ----- Regarding: Needs mammo Needs annual Mammogram Epic was out when I saw her  Thanks marie

## 2016-08-18 NOTE — Telephone Encounter (Signed)
Patient scheduled for pelvic u/s on 8/21 @ 2pm. Patient was called and notified. Understanding voiced.

## 2016-08-18 NOTE — Telephone Encounter (Signed)
Patient scheduled for mammogram on 8/28 @ 2:20pm. Called patient and gave her appointment details. Understanding voiced.

## 2016-08-18 NOTE — Telephone Encounter (Signed)
-----   Message from Seabron Spates, CNM sent at 08/12/2016  4:22 PM EDT ----- Regarding: needs [place] scheduled I ordered a pelvic US after she left to evaluate her lining  Has hx of DUB and is not bleeding on Megace  Can you schedule this?  Thanks [employer]

## 2016-08-23 ENCOUNTER — Encounter (HOSPITAL_COMMUNITY)
Admission: RE | Admit: 2016-08-23 | Discharge: 2016-08-23 | Disposition: A | Discharge status: Home / Self Care | Payer: BLUE CROSS/BLUE SHIELD | Source: Ambulatory Visit | Attending: Nephrology | Admitting: Nephrology

## 2016-08-23 DIAGNOSIS — D631 Anemia in chronic kidney disease: Secondary | ICD-10-CM | POA: Insufficient documentation

## 2016-08-23 DIAGNOSIS — Z5181 Encounter for therapeutic drug level monitoring: Secondary | ICD-10-CM | POA: Diagnosis not present

## 2016-08-23 DIAGNOSIS — N184 Chronic kidney disease, stage 4 (severe): Secondary | ICD-10-CM

## 2016-08-23 DIAGNOSIS — Z79899 Other long term (current) drug therapy: Secondary | ICD-10-CM | POA: Insufficient documentation

## 2016-08-23 DIAGNOSIS — N185 Chronic kidney disease, stage 5: Secondary | ICD-10-CM | POA: Diagnosis not present

## 2016-08-23 LAB — FERRITIN: Ferritin: 494 ng/mL — ABNORMAL HIGH (ref 11–307)

## 2016-08-23 LAB — IRON AND TIBC
Iron: 69 ug/dL (ref 28–170)
Saturation Ratios: 29 % (ref 10.4–31.8)
TIBC: 238 ug/dL — ABNORMAL LOW (ref 250–450)
UIBC: 169 ug/dL

## 2016-08-23 LAB — POCT HEMOGLOBIN-HEMACUE: HEMOGLOBIN: 10.7 g/dL — AB (ref 12.0–15.0)

## 2016-08-23 MED ORDER — DARBEPOETIN ALFA 100 MCG/0.5ML IJ SOSY
PREFILLED_SYRINGE | INTRAMUSCULAR | Status: AC
  Filled 2016-08-23: qty 0.5

## 2016-08-23 MED ORDER — DARBEPOETIN ALFA 100 MCG/0.5ML IJ SOSY
100.0000 ug | PREFILLED_SYRINGE | INTRAMUSCULAR | Status: DC
  Administered 2016-08-23: 100 ug via SUBCUTANEOUS

## 2016-08-24 ENCOUNTER — Ambulatory Visit (HOSPITAL_COMMUNITY)
Admission: RE | Admit: 2016-08-24 | Discharge: 2016-08-24 | Disposition: A | Discharge status: Home / Self Care | Payer: BLUE CROSS/BLUE SHIELD | Source: Ambulatory Visit | Attending: Advanced Practice Midwife | Admitting: Advanced Practice Midwife

## 2016-08-24 DIAGNOSIS — D259 Leiomyoma of uterus, unspecified: Secondary | ICD-10-CM | POA: Diagnosis not present

## 2016-08-24 DIAGNOSIS — N921 Excessive and frequent menstruation with irregular cycle: Secondary | ICD-10-CM | POA: Insufficient documentation

## 2016-08-27 ENCOUNTER — Ambulatory Visit: Payer: BLUE CROSS/BLUE SHIELD

## 2016-08-31 ENCOUNTER — Ambulatory Visit: Payer: BLUE CROSS/BLUE SHIELD

## 2016-08-31 ENCOUNTER — Ambulatory Visit
Admission: RE | Admit: 2016-08-31 | Discharge: 2016-08-31 | Disposition: A | Discharge status: Home / Self Care | Payer: BLUE CROSS/BLUE SHIELD | Source: Ambulatory Visit | Attending: Advanced Practice Midwife | Admitting: Advanced Practice Midwife

## 2016-08-31 ENCOUNTER — Other Ambulatory Visit: Payer: Self-pay | Admitting: Advanced Practice Midwife

## 2016-08-31 DIAGNOSIS — Z1231 Encounter for screening mammogram for malignant neoplasm of breast: Secondary | ICD-10-CM

## 2016-08-31 DIAGNOSIS — N6320 Unspecified lump in the left breast, unspecified quadrant: Secondary | ICD-10-CM

## 2016-09-01 ENCOUNTER — Encounter: Payer: Self-pay | Admitting: Advanced Practice Midwife

## 2016-09-01 ENCOUNTER — Ambulatory Visit
Admission: RE | Admit: 2016-09-01 | Discharge: 2016-09-01 | Disposition: A | Discharge status: Home / Self Care | Payer: BLUE CROSS/BLUE SHIELD | Source: Ambulatory Visit | Attending: Advanced Practice Midwife | Admitting: Advanced Practice Midwife

## 2016-09-01 ENCOUNTER — Other Ambulatory Visit: Payer: Self-pay | Admitting: Advanced Practice Midwife

## 2016-09-01 DIAGNOSIS — N63 Unspecified lump in unspecified breast: Secondary | ICD-10-CM | POA: Insufficient documentation

## 2016-09-01 DIAGNOSIS — N6320 Unspecified lump in the left breast, unspecified quadrant: Secondary | ICD-10-CM

## 2016-09-01 HISTORY — DX: Unspecified lump in unspecified breast: N63.0

## 2016-09-20 ENCOUNTER — Encounter (HOSPITAL_COMMUNITY)
Admission: RE | Admit: 2016-09-20 | Discharge: 2016-09-20 | Disposition: A | Discharge status: Home / Self Care | Payer: BLUE CROSS/BLUE SHIELD | Source: Ambulatory Visit | Attending: Nephrology | Admitting: Nephrology

## 2016-09-20 DIAGNOSIS — D631 Anemia in chronic kidney disease: Secondary | ICD-10-CM | POA: Diagnosis not present

## 2016-09-20 DIAGNOSIS — Z79899 Other long term (current) drug therapy: Secondary | ICD-10-CM | POA: Diagnosis not present

## 2016-09-20 DIAGNOSIS — N185 Chronic kidney disease, stage 5: Secondary | ICD-10-CM | POA: Diagnosis not present

## 2016-09-20 DIAGNOSIS — Z5181 Encounter for therapeutic drug level monitoring: Secondary | ICD-10-CM | POA: Insufficient documentation

## 2016-09-20 DIAGNOSIS — N184 Chronic kidney disease, stage 4 (severe): Secondary | ICD-10-CM

## 2016-09-20 LAB — IRON AND TIBC
IRON: 64 ug/dL (ref 28–170)
Saturation Ratios: 26 % (ref 10.4–31.8)
TIBC: 244 ug/dL — ABNORMAL LOW (ref 250–450)
UIBC: 180 ug/dL

## 2016-09-20 LAB — FERRITIN: Ferritin: 559 ng/mL — ABNORMAL HIGH (ref 11–307)

## 2016-09-20 LAB — POCT HEMOGLOBIN-HEMACUE: HEMOGLOBIN: 10 g/dL — AB (ref 12.0–15.0)

## 2016-09-20 MED ORDER — DARBEPOETIN ALFA 100 MCG/0.5ML IJ SOSY
100.0000 ug | PREFILLED_SYRINGE | INTRAMUSCULAR | Status: DC
  Administered 2016-09-20: 100 ug via SUBCUTANEOUS

## 2016-09-20 MED ORDER — DARBEPOETIN ALFA 100 MCG/0.5ML IJ SOSY
PREFILLED_SYRINGE | INTRAMUSCULAR | Status: AC
  Filled 2016-09-20: qty 0.5

## 2016-10-18 ENCOUNTER — Encounter (HOSPITAL_COMMUNITY)
Admission: RE | Admit: 2016-10-18 | Discharge: 2016-10-18 | Disposition: A | Discharge status: Home / Self Care | Payer: BLUE CROSS/BLUE SHIELD | Source: Ambulatory Visit | Attending: Nephrology | Admitting: Nephrology

## 2016-10-18 DIAGNOSIS — Z79899 Other long term (current) drug therapy: Secondary | ICD-10-CM | POA: Diagnosis not present

## 2016-10-18 DIAGNOSIS — D631 Anemia in chronic kidney disease: Secondary | ICD-10-CM | POA: Diagnosis not present

## 2016-10-18 DIAGNOSIS — Z5181 Encounter for therapeutic drug level monitoring: Secondary | ICD-10-CM | POA: Diagnosis not present

## 2016-10-18 DIAGNOSIS — N185 Chronic kidney disease, stage 5: Secondary | ICD-10-CM | POA: Insufficient documentation

## 2016-10-18 DIAGNOSIS — N184 Chronic kidney disease, stage 4 (severe): Secondary | ICD-10-CM

## 2016-10-18 LAB — IRON AND TIBC
Iron: 76 ug/dL (ref 28–170)
SATURATION RATIOS: 31 % (ref 10.4–31.8)
TIBC: 248 ug/dL — ABNORMAL LOW (ref 250–450)
UIBC: 172 ug/dL

## 2016-10-18 LAB — FERRITIN: Ferritin: 604 ng/mL — ABNORMAL HIGH (ref 11–307)

## 2016-10-18 LAB — POCT HEMOGLOBIN-HEMACUE: HEMOGLOBIN: 10.4 g/dL — AB (ref 12.0–15.0)

## 2016-10-18 MED ORDER — DARBEPOETIN ALFA 100 MCG/0.5ML IJ SOSY
100.0000 ug | PREFILLED_SYRINGE | INTRAMUSCULAR | Status: DC
  Administered 2016-10-18: 100 ug via SUBCUTANEOUS

## 2016-10-18 MED ORDER — DARBEPOETIN ALFA 100 MCG/0.5ML IJ SOSY
PREFILLED_SYRINGE | INTRAMUSCULAR | Status: AC
  Filled 2016-10-18: qty 0.5

## 2016-11-15 ENCOUNTER — Encounter (HOSPITAL_COMMUNITY)
Admission: RE | Admit: 2016-11-15 | Discharge: 2016-11-15 | Disposition: A | Discharge status: Home / Self Care | Payer: BLUE CROSS/BLUE SHIELD | Source: Ambulatory Visit | Attending: Nephrology | Admitting: Nephrology

## 2016-11-15 VITALS — BP 142/78 | HR 69 | Temp 98.2°F | Resp 18

## 2016-11-15 DIAGNOSIS — N185 Chronic kidney disease, stage 5: Secondary | ICD-10-CM | POA: Insufficient documentation

## 2016-11-15 DIAGNOSIS — Z79899 Other long term (current) drug therapy: Secondary | ICD-10-CM | POA: Insufficient documentation

## 2016-11-15 DIAGNOSIS — D631 Anemia in chronic kidney disease: Secondary | ICD-10-CM | POA: Diagnosis not present

## 2016-11-15 DIAGNOSIS — Z5181 Encounter for therapeutic drug level monitoring: Secondary | ICD-10-CM | POA: Insufficient documentation

## 2016-11-15 DIAGNOSIS — N184 Chronic kidney disease, stage 4 (severe): Secondary | ICD-10-CM

## 2016-11-15 LAB — IRON AND TIBC
IRON: 67 ug/dL (ref 28–170)
Saturation Ratios: 28 % (ref 10.4–31.8)
TIBC: 239 ug/dL — ABNORMAL LOW (ref 250–450)
UIBC: 172 ug/dL

## 2016-11-15 LAB — FERRITIN: FERRITIN: 642 ng/mL — AB (ref 11–307)

## 2016-11-15 LAB — POCT HEMOGLOBIN-HEMACUE: Hemoglobin: 10.5 g/dL — ABNORMAL LOW (ref 12.0–15.0)

## 2016-11-15 MED ORDER — DARBEPOETIN ALFA 100 MCG/0.5ML IJ SOSY
100.0000 ug | PREFILLED_SYRINGE | INTRAMUSCULAR | Status: DC
  Administered 2016-11-15: 100 ug via SUBCUTANEOUS

## 2016-11-15 MED ORDER — DARBEPOETIN ALFA 100 MCG/0.5ML IJ SOSY
PREFILLED_SYRINGE | INTRAMUSCULAR | Status: AC
  Filled 2016-11-15: qty 0.5

## 2016-12-10 ENCOUNTER — Other Ambulatory Visit (HOSPITAL_COMMUNITY): Payer: Self-pay | Admitting: *Deleted

## 2016-12-13 ENCOUNTER — Encounter (HOSPITAL_COMMUNITY): Payer: BLUE CROSS/BLUE SHIELD

## 2016-12-15 ENCOUNTER — Encounter (HOSPITAL_COMMUNITY)
Admission: RE | Admit: 2016-12-15 | Discharge: 2016-12-15 | Disposition: A | Discharge status: Home / Self Care | Payer: BLUE CROSS/BLUE SHIELD | Source: Ambulatory Visit | Attending: Nephrology | Admitting: Nephrology

## 2016-12-15 VITALS — BP 132/67 | HR 81 | Temp 97.6°F | Resp 18

## 2016-12-15 DIAGNOSIS — D631 Anemia in chronic kidney disease: Secondary | ICD-10-CM | POA: Diagnosis not present

## 2016-12-15 DIAGNOSIS — N185 Chronic kidney disease, stage 5: Secondary | ICD-10-CM | POA: Insufficient documentation

## 2016-12-15 DIAGNOSIS — Z5181 Encounter for therapeutic drug level monitoring: Secondary | ICD-10-CM | POA: Diagnosis not present

## 2016-12-15 DIAGNOSIS — N184 Chronic kidney disease, stage 4 (severe): Secondary | ICD-10-CM

## 2016-12-15 DIAGNOSIS — Z79899 Other long term (current) drug therapy: Secondary | ICD-10-CM | POA: Insufficient documentation

## 2016-12-15 LAB — POCT HEMOGLOBIN-HEMACUE: Hemoglobin: 10.8 g/dL — ABNORMAL LOW (ref 12.0–15.0)

## 2016-12-15 LAB — IRON AND TIBC
IRON: 70 ug/dL (ref 28–170)
SATURATION RATIOS: 29 % (ref 10.4–31.8)
TIBC: 238 ug/dL — AB (ref 250–450)
UIBC: 168 ug/dL

## 2016-12-15 LAB — FERRITIN: Ferritin: 659 ng/mL — ABNORMAL HIGH (ref 11–307)

## 2016-12-15 MED ORDER — DARBEPOETIN ALFA 100 MCG/0.5ML IJ SOSY
100.0000 ug | PREFILLED_SYRINGE | INTRAMUSCULAR | Status: DC
  Administered 2016-12-15: 100 ug via SUBCUTANEOUS

## 2016-12-15 MED ORDER — DARBEPOETIN ALFA 100 MCG/0.5ML IJ SOSY
PREFILLED_SYRINGE | INTRAMUSCULAR | Status: AC
  Administered 2016-12-15: 100 ug via SUBCUTANEOUS
  Filled 2016-12-15: qty 0.5

## 2017-01-12 ENCOUNTER — Encounter (HOSPITAL_COMMUNITY)
Admission: RE | Admit: 2017-01-12 | Discharge: 2017-01-12 | Disposition: A | Discharge status: Home / Self Care | Payer: BLUE CROSS/BLUE SHIELD | Source: Ambulatory Visit | Attending: Nephrology | Admitting: Nephrology

## 2017-01-12 VITALS — BP 144/68 | HR 74 | Resp 18

## 2017-01-12 DIAGNOSIS — N185 Chronic kidney disease, stage 5: Secondary | ICD-10-CM | POA: Diagnosis not present

## 2017-01-12 DIAGNOSIS — Z79899 Other long term (current) drug therapy: Secondary | ICD-10-CM | POA: Insufficient documentation

## 2017-01-12 DIAGNOSIS — N184 Chronic kidney disease, stage 4 (severe): Secondary | ICD-10-CM

## 2017-01-12 DIAGNOSIS — Z5181 Encounter for therapeutic drug level monitoring: Secondary | ICD-10-CM | POA: Insufficient documentation

## 2017-01-12 DIAGNOSIS — D631 Anemia in chronic kidney disease: Secondary | ICD-10-CM | POA: Insufficient documentation

## 2017-01-12 LAB — IRON AND TIBC
IRON: 74 ug/dL (ref 28–170)
Saturation Ratios: 31 % (ref 10.4–31.8)
TIBC: 239 ug/dL — AB (ref 250–450)
UIBC: 165 ug/dL

## 2017-01-12 LAB — POCT HEMOGLOBIN-HEMACUE: HEMOGLOBIN: 10.4 g/dL — AB (ref 12.0–15.0)

## 2017-01-12 LAB — FERRITIN: FERRITIN: 628 ng/mL — AB (ref 11–307)

## 2017-01-12 MED ORDER — DARBEPOETIN ALFA 100 MCG/0.5ML IJ SOSY
PREFILLED_SYRINGE | INTRAMUSCULAR | Status: AC
  Filled 2017-01-12: qty 0.5

## 2017-01-12 MED ORDER — DARBEPOETIN ALFA 100 MCG/0.5ML IJ SOSY
100.0000 ug | PREFILLED_SYRINGE | INTRAMUSCULAR | Status: DC
  Administered 2017-01-12: 100 ug via SUBCUTANEOUS

## 2017-02-09 ENCOUNTER — Encounter (HOSPITAL_COMMUNITY)
Admission: RE | Admit: 2017-02-09 | Discharge: 2017-02-09 | Disposition: A | Discharge status: Home / Self Care | Payer: BLUE CROSS/BLUE SHIELD | Source: Ambulatory Visit | Attending: Nephrology | Admitting: Nephrology

## 2017-02-09 VITALS — BP 127/64 | HR 85 | Resp 18

## 2017-02-09 DIAGNOSIS — N185 Chronic kidney disease, stage 5: Secondary | ICD-10-CM | POA: Diagnosis not present

## 2017-02-09 DIAGNOSIS — N184 Chronic kidney disease, stage 4 (severe): Secondary | ICD-10-CM

## 2017-02-09 DIAGNOSIS — Z5181 Encounter for therapeutic drug level monitoring: Secondary | ICD-10-CM | POA: Diagnosis not present

## 2017-02-09 DIAGNOSIS — Z79899 Other long term (current) drug therapy: Secondary | ICD-10-CM | POA: Insufficient documentation

## 2017-02-09 DIAGNOSIS — D631 Anemia in chronic kidney disease: Secondary | ICD-10-CM | POA: Diagnosis not present

## 2017-02-09 LAB — IRON AND TIBC
IRON: 52 ug/dL (ref 28–170)
Saturation Ratios: 23 % (ref 10.4–31.8)
TIBC: 224 ug/dL — AB (ref 250–450)
UIBC: 172 ug/dL

## 2017-02-09 LAB — FERRITIN: Ferritin: 562 ng/mL — ABNORMAL HIGH (ref 11–307)

## 2017-02-09 LAB — POCT HEMOGLOBIN-HEMACUE: HEMOGLOBIN: 9.9 g/dL — AB (ref 12.0–15.0)

## 2017-02-09 MED ORDER — DARBEPOETIN ALFA 100 MCG/0.5ML IJ SOSY
100.0000 ug | PREFILLED_SYRINGE | INTRAMUSCULAR | Status: DC
  Administered 2017-02-09: 100 ug via SUBCUTANEOUS

## 2017-02-09 MED ORDER — DARBEPOETIN ALFA 100 MCG/0.5ML IJ SOSY
PREFILLED_SYRINGE | INTRAMUSCULAR | Status: AC
  Filled 2017-02-09: qty 0.5

## 2017-02-13 IMAGING — CR DG CHEST 1V PORT
1 series · 1 of 1 positions shown · non-contrast
Comparison: 02/05/2013

CLINICAL DATA: Leukocytosis.

EXAM:
PORTABLE CHEST - 1 VIEW

[portable]
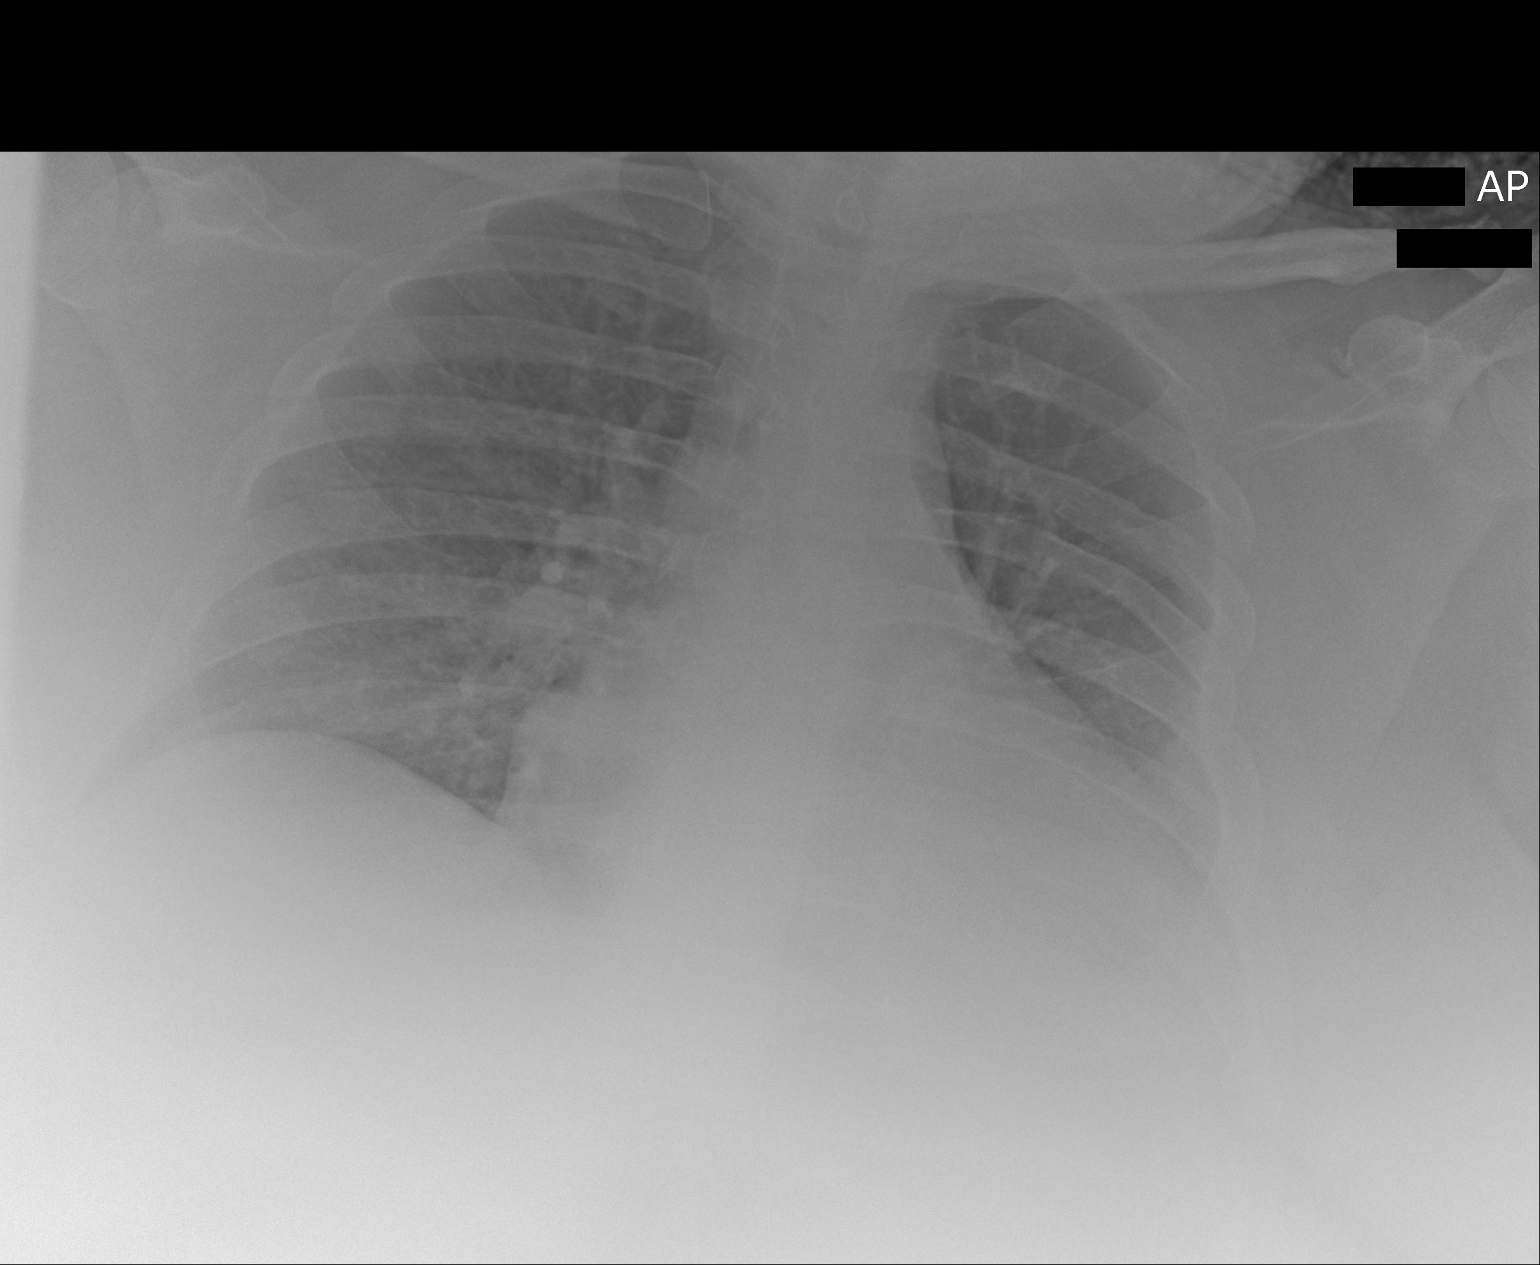

[1 of 1 positions shown; findings below may reference images not displayed]

FINDINGS: Cardiomegaly with vascular congestion. Bibasilar opacities with low
lung volumes. This likely reflects atelectasis. No overt edema or
effusions. No acute bony abnormality.
IMPRESSION: Cardiomegaly with vascular congestion.

Low lung volumes with bibasilar atelectasis.

## 2017-03-03 ENCOUNTER — Ambulatory Visit: Payer: BLUE CROSS/BLUE SHIELD

## 2017-03-03 ENCOUNTER — Ambulatory Visit
Admission: RE | Admit: 2017-03-03 | Discharge: 2017-03-03 | Disposition: A | Discharge status: Home / Self Care | Payer: BLUE CROSS/BLUE SHIELD | Source: Ambulatory Visit | Attending: Advanced Practice Midwife | Admitting: Advanced Practice Midwife

## 2017-03-03 DIAGNOSIS — N6320 Unspecified lump in the left breast, unspecified quadrant: Secondary | ICD-10-CM

## 2017-03-09 ENCOUNTER — Encounter (HOSPITAL_COMMUNITY)
Admission: RE | Admit: 2017-03-09 | Discharge: 2017-03-09 | Disposition: A | Discharge status: Home / Self Care | Payer: BLUE CROSS/BLUE SHIELD | Source: Ambulatory Visit | Attending: Nephrology | Admitting: Nephrology

## 2017-03-09 VITALS — BP 125/72 | HR 79 | Temp 98.5°F | Resp 18

## 2017-03-09 DIAGNOSIS — Z79899 Other long term (current) drug therapy: Secondary | ICD-10-CM | POA: Insufficient documentation

## 2017-03-09 DIAGNOSIS — N185 Chronic kidney disease, stage 5: Secondary | ICD-10-CM | POA: Diagnosis not present

## 2017-03-09 DIAGNOSIS — N184 Chronic kidney disease, stage 4 (severe): Secondary | ICD-10-CM

## 2017-03-09 DIAGNOSIS — D631 Anemia in chronic kidney disease: Secondary | ICD-10-CM | POA: Diagnosis not present

## 2017-03-09 DIAGNOSIS — Z5181 Encounter for therapeutic drug level monitoring: Secondary | ICD-10-CM | POA: Diagnosis not present

## 2017-03-09 LAB — IRON AND TIBC
Iron: 85 ug/dL (ref 28–170)
SATURATION RATIOS: 36 % — AB (ref 10.4–31.8)
TIBC: 239 ug/dL — ABNORMAL LOW (ref 250–450)
UIBC: 154 ug/dL

## 2017-03-09 LAB — POCT HEMOGLOBIN-HEMACUE: HEMOGLOBIN: 9.7 g/dL — AB (ref 12.0–15.0)

## 2017-03-09 LAB — FERRITIN: Ferritin: 581 ng/mL — ABNORMAL HIGH (ref 11–307)

## 2017-03-09 MED ORDER — DARBEPOETIN ALFA 100 MCG/0.5ML IJ SOSY
PREFILLED_SYRINGE | INTRAMUSCULAR | Status: AC
  Filled 2017-03-09: qty 0.5

## 2017-03-09 MED ORDER — DARBEPOETIN ALFA 100 MCG/0.5ML IJ SOSY
100.0000 ug | PREFILLED_SYRINGE | INTRAMUSCULAR | Status: DC
  Administered 2017-03-09: 100 ug via SUBCUTANEOUS

## 2017-04-06 ENCOUNTER — Encounter (HOSPITAL_COMMUNITY)
Admission: RE | Admit: 2017-04-06 | Discharge: 2017-04-06 | Disposition: A | Discharge status: Home / Self Care | Payer: BLUE CROSS/BLUE SHIELD | Source: Ambulatory Visit | Attending: Nephrology | Admitting: Nephrology

## 2017-04-06 VITALS — BP 125/65 | HR 81 | Temp 98.3°F | Resp 18

## 2017-04-06 DIAGNOSIS — D631 Anemia in chronic kidney disease: Secondary | ICD-10-CM | POA: Insufficient documentation

## 2017-04-06 DIAGNOSIS — N184 Chronic kidney disease, stage 4 (severe): Secondary | ICD-10-CM

## 2017-04-06 DIAGNOSIS — Z79899 Other long term (current) drug therapy: Secondary | ICD-10-CM | POA: Diagnosis not present

## 2017-04-06 DIAGNOSIS — N185 Chronic kidney disease, stage 5: Secondary | ICD-10-CM | POA: Diagnosis not present

## 2017-04-06 DIAGNOSIS — Z5181 Encounter for therapeutic drug level monitoring: Secondary | ICD-10-CM | POA: Insufficient documentation

## 2017-04-06 LAB — IRON AND TIBC
IRON: 67 ug/dL (ref 28–170)
Saturation Ratios: 27 % (ref 10.4–31.8)
TIBC: 246 ug/dL — ABNORMAL LOW (ref 250–450)
UIBC: 179 ug/dL

## 2017-04-06 LAB — FERRITIN: Ferritin: 490 ng/mL — ABNORMAL HIGH (ref 11–307)

## 2017-04-06 LAB — POCT HEMOGLOBIN-HEMACUE: HEMOGLOBIN: 9.7 g/dL — AB (ref 12.0–15.0)

## 2017-04-06 MED ORDER — DARBEPOETIN ALFA 150 MCG/0.3ML IJ SOSY
150.0000 ug | PREFILLED_SYRINGE | INTRAMUSCULAR | Status: DC
  Administered 2017-04-06: 150 ug via SUBCUTANEOUS

## 2017-04-06 MED ORDER — DARBEPOETIN ALFA 150 MCG/0.3ML IJ SOSY
PREFILLED_SYRINGE | INTRAMUSCULAR | Status: AC
  Filled 2017-04-06: qty 0.3

## 2017-05-04 ENCOUNTER — Encounter (HOSPITAL_COMMUNITY)
Admission: RE | Admit: 2017-05-04 | Discharge: 2017-05-04 | Disposition: A | Discharge status: Home / Self Care | Payer: BLUE CROSS/BLUE SHIELD | Source: Ambulatory Visit | Attending: Nephrology | Admitting: Nephrology

## 2017-05-04 VITALS — BP 127/65 | HR 78 | Temp 98.6°F | Resp 18

## 2017-05-04 DIAGNOSIS — N185 Chronic kidney disease, stage 5: Secondary | ICD-10-CM | POA: Diagnosis not present

## 2017-05-04 DIAGNOSIS — Z5181 Encounter for therapeutic drug level monitoring: Secondary | ICD-10-CM | POA: Diagnosis not present

## 2017-05-04 DIAGNOSIS — D631 Anemia in chronic kidney disease: Secondary | ICD-10-CM | POA: Insufficient documentation

## 2017-05-04 DIAGNOSIS — N184 Chronic kidney disease, stage 4 (severe): Secondary | ICD-10-CM

## 2017-05-04 DIAGNOSIS — Z79899 Other long term (current) drug therapy: Secondary | ICD-10-CM | POA: Diagnosis not present

## 2017-05-04 LAB — IRON AND TIBC
Iron: 65 ug/dL (ref 28–170)
SATURATION RATIOS: 28 % (ref 10.4–31.8)
TIBC: 235 ug/dL — ABNORMAL LOW (ref 250–450)
UIBC: 170 ug/dL

## 2017-05-04 LAB — FERRITIN: Ferritin: 596 ng/mL — ABNORMAL HIGH (ref 11–307)

## 2017-05-04 LAB — POCT HEMOGLOBIN-HEMACUE: Hemoglobin: 10 g/dL — ABNORMAL LOW (ref 12.0–15.0)

## 2017-05-04 MED ORDER — DARBEPOETIN ALFA 200 MCG/0.4ML IJ SOSY
PREFILLED_SYRINGE | INTRAMUSCULAR | Status: AC
  Filled 2017-05-04: qty 0.4

## 2017-05-04 MED ORDER — DARBEPOETIN ALFA 200 MCG/0.4ML IJ SOSY
200.0000 ug | PREFILLED_SYRINGE | INTRAMUSCULAR | Status: DC
  Administered 2017-05-04: 200 ug via SUBCUTANEOUS

## 2017-06-01 ENCOUNTER — Ambulatory Visit (HOSPITAL_COMMUNITY)
Admission: RE | Admit: 2017-06-01 | Discharge: 2017-06-01 | Disposition: A | Discharge status: Home / Self Care | Payer: BLUE CROSS/BLUE SHIELD | Source: Ambulatory Visit | Attending: Nephrology | Admitting: Nephrology

## 2017-06-01 VITALS — BP 123/72 | HR 76 | Temp 98.2°F | Resp 18

## 2017-06-01 DIAGNOSIS — N185 Chronic kidney disease, stage 5: Secondary | ICD-10-CM | POA: Diagnosis not present

## 2017-06-01 DIAGNOSIS — N184 Chronic kidney disease, stage 4 (severe): Secondary | ICD-10-CM

## 2017-06-01 DIAGNOSIS — D631 Anemia in chronic kidney disease: Secondary | ICD-10-CM | POA: Insufficient documentation

## 2017-06-01 LAB — POCT HEMOGLOBIN-HEMACUE: HEMOGLOBIN: 10.2 g/dL — AB (ref 12.0–15.0)

## 2017-06-01 MED ORDER — DARBEPOETIN ALFA 200 MCG/0.4ML IJ SOSY
200.0000 ug | PREFILLED_SYRINGE | INTRAMUSCULAR | Status: DC
  Administered 2017-06-01: 200 ug via SUBCUTANEOUS

## 2017-06-01 MED ORDER — DARBEPOETIN ALFA 200 MCG/0.4ML IJ SOSY
PREFILLED_SYRINGE | INTRAMUSCULAR | Status: AC
  Filled 2017-06-01: qty 0.4

## 2017-06-29 ENCOUNTER — Ambulatory Visit (HOSPITAL_COMMUNITY)
Admission: RE | Admit: 2017-06-29 | Discharge: 2017-06-29 | Disposition: A | Discharge status: Home / Self Care | Payer: BLUE CROSS/BLUE SHIELD | Source: Ambulatory Visit | Attending: Nephrology | Admitting: Nephrology

## 2017-07-01 ENCOUNTER — Ambulatory Visit (HOSPITAL_COMMUNITY)
Admission: RE | Admit: 2017-07-01 | Discharge: 2017-07-01 | Disposition: A | Discharge status: Home / Self Care | Payer: BLUE CROSS/BLUE SHIELD | Source: Ambulatory Visit | Attending: Nephrology | Admitting: Nephrology

## 2017-07-01 VITALS — BP 132/72 | HR 80 | Resp 18

## 2017-07-01 DIAGNOSIS — D631 Anemia in chronic kidney disease: Secondary | ICD-10-CM | POA: Diagnosis present

## 2017-07-01 DIAGNOSIS — N185 Chronic kidney disease, stage 5: Secondary | ICD-10-CM | POA: Diagnosis not present

## 2017-07-01 DIAGNOSIS — N184 Chronic kidney disease, stage 4 (severe): Secondary | ICD-10-CM

## 2017-07-01 LAB — IRON AND TIBC
IRON: 78 ug/dL (ref 28–170)
SATURATION RATIOS: 33 % — AB (ref 10.4–31.8)
TIBC: 239 ug/dL — AB (ref 250–450)
UIBC: 161 ug/dL

## 2017-07-01 LAB — FERRITIN: Ferritin: 593 ng/mL — ABNORMAL HIGH (ref 11–307)

## 2017-07-01 LAB — POCT HEMOGLOBIN-HEMACUE: Hemoglobin: 10.4 g/dL — ABNORMAL LOW (ref 12.0–15.0)

## 2017-07-01 MED ORDER — DARBEPOETIN ALFA 200 MCG/0.4ML IJ SOSY
PREFILLED_SYRINGE | INTRAMUSCULAR | Status: AC
  Filled 2017-07-01: qty 0.4

## 2017-07-01 MED ORDER — DARBEPOETIN ALFA 200 MCG/0.4ML IJ SOSY
200.0000 ug | PREFILLED_SYRINGE | INTRAMUSCULAR | Status: DC
  Administered 2017-07-01: 200 ug via SUBCUTANEOUS

## 2017-07-27 ENCOUNTER — Inpatient Hospital Stay (HOSPITAL_COMMUNITY): Admission: RE | Admit: 2017-07-27 | Payer: BLUE CROSS/BLUE SHIELD | Source: Ambulatory Visit

## 2017-07-29 ENCOUNTER — Ambulatory Visit (HOSPITAL_COMMUNITY)
Admission: RE | Admit: 2017-07-29 | Discharge: 2017-07-29 | Disposition: A | Discharge status: Home / Self Care | Payer: BLUE CROSS/BLUE SHIELD | Source: Ambulatory Visit | Attending: Nephrology | Admitting: Nephrology

## 2017-07-29 VITALS — BP 130/74 | HR 72 | Temp 98.7°F | Resp 18

## 2017-07-29 DIAGNOSIS — D631 Anemia in chronic kidney disease: Secondary | ICD-10-CM | POA: Insufficient documentation

## 2017-07-29 DIAGNOSIS — N185 Chronic kidney disease, stage 5: Secondary | ICD-10-CM | POA: Insufficient documentation

## 2017-07-29 DIAGNOSIS — N184 Chronic kidney disease, stage 4 (severe): Secondary | ICD-10-CM

## 2017-07-29 LAB — POCT HEMOGLOBIN-HEMACUE: Hemoglobin: 10.4 g/dL — ABNORMAL LOW (ref 12.0–15.0)

## 2017-07-29 LAB — IRON AND TIBC
IRON: 81 ug/dL (ref 28–170)
Saturation Ratios: 31 % (ref 10.4–31.8)
TIBC: 260 ug/dL (ref 250–450)
UIBC: 179 ug/dL

## 2017-07-29 LAB — FERRITIN: FERRITIN: 550 ng/mL — AB (ref 11–307)

## 2017-07-29 MED ORDER — DARBEPOETIN ALFA 200 MCG/0.4ML IJ SOSY
200.0000 ug | PREFILLED_SYRINGE | INTRAMUSCULAR | Status: DC
  Administered 2017-07-29: 200 ug via SUBCUTANEOUS

## 2017-07-29 MED ORDER — DARBEPOETIN ALFA 200 MCG/0.4ML IJ SOSY
PREFILLED_SYRINGE | INTRAMUSCULAR | Status: AC
  Administered 2017-07-29: 200 ug via SUBCUTANEOUS
  Filled 2017-07-29: qty 0.4

## 2017-08-23 ENCOUNTER — Other Ambulatory Visit: Payer: Self-pay | Admitting: Advanced Practice Midwife

## 2017-08-24 ENCOUNTER — Encounter (HOSPITAL_COMMUNITY): Payer: BLUE CROSS/BLUE SHIELD

## 2017-08-26 ENCOUNTER — Encounter (HOSPITAL_COMMUNITY): Payer: BLUE CROSS/BLUE SHIELD

## 2017-08-30 ENCOUNTER — Ambulatory Visit (HOSPITAL_COMMUNITY)
Admission: RE | Admit: 2017-08-30 | Discharge: 2017-08-30 | Disposition: A | Discharge status: Home / Self Care | Payer: BLUE CROSS/BLUE SHIELD | Source: Ambulatory Visit | Attending: Nephrology | Admitting: Nephrology

## 2017-08-30 VITALS — BP 146/79 | HR 79

## 2017-08-30 DIAGNOSIS — N184 Chronic kidney disease, stage 4 (severe): Secondary | ICD-10-CM

## 2017-08-30 DIAGNOSIS — N185 Chronic kidney disease, stage 5: Secondary | ICD-10-CM | POA: Insufficient documentation

## 2017-08-30 DIAGNOSIS — D631 Anemia in chronic kidney disease: Secondary | ICD-10-CM | POA: Diagnosis present

## 2017-08-30 LAB — POCT HEMOGLOBIN-HEMACUE: HEMOGLOBIN: 10 g/dL — AB (ref 12.0–15.0)

## 2017-08-30 LAB — IRON AND TIBC
Iron: 86 ug/dL (ref 28–170)
Saturation Ratios: 37 % — ABNORMAL HIGH (ref 10.4–31.8)
TIBC: 231 ug/dL — ABNORMAL LOW (ref 250–450)
UIBC: 145 ug/dL

## 2017-08-30 LAB — FERRITIN: Ferritin: 608 ng/mL — ABNORMAL HIGH (ref 11–307)

## 2017-08-30 MED ORDER — DARBEPOETIN ALFA 200 MCG/0.4ML IJ SOSY: 200.0000 ug | PREFILLED_SYRINGE | INTRAMUSCULAR | Status: DC

## 2017-08-30 MED ORDER — DARBEPOETIN ALFA 200 MCG/0.4ML IJ SOSY
PREFILLED_SYRINGE | INTRAMUSCULAR | Status: AC
  Administered 2017-08-30: 200 ug
  Filled 2017-08-30: qty 0.4

## 2017-09-21 ENCOUNTER — Other Ambulatory Visit: Payer: Self-pay | Admitting: Advanced Practice Midwife

## 2017-09-21 ENCOUNTER — Inpatient Hospital Stay (HOSPITAL_COMMUNITY): Admission: RE | Admit: 2017-09-21 | Payer: BLUE CROSS/BLUE SHIELD | Source: Ambulatory Visit

## 2017-09-21 DIAGNOSIS — N6320 Unspecified lump in the left breast, unspecified quadrant: Secondary | ICD-10-CM

## 2017-09-23 ENCOUNTER — Ambulatory Visit (HOSPITAL_COMMUNITY)
Admission: RE | Admit: 2017-09-23 | Discharge: 2017-09-23 | Disposition: A | Discharge status: Home / Self Care | Payer: BLUE CROSS/BLUE SHIELD | Source: Ambulatory Visit | Attending: Nephrology | Admitting: Nephrology

## 2017-09-23 VITALS — BP 125/79 | HR 76 | Temp 97.7°F | Resp 20

## 2017-09-23 DIAGNOSIS — N185 Chronic kidney disease, stage 5: Secondary | ICD-10-CM | POA: Diagnosis not present

## 2017-09-23 DIAGNOSIS — N184 Chronic kidney disease, stage 4 (severe): Secondary | ICD-10-CM

## 2017-09-23 DIAGNOSIS — D631 Anemia in chronic kidney disease: Secondary | ICD-10-CM | POA: Diagnosis present

## 2017-09-23 LAB — IRON AND TIBC
Iron: 78 ug/dL (ref 28–170)
Saturation Ratios: 34 % — ABNORMAL HIGH (ref 10.4–31.8)
TIBC: 232 ug/dL — ABNORMAL LOW (ref 250–450)
UIBC: 154 ug/dL

## 2017-09-23 LAB — POCT HEMOGLOBIN-HEMACUE: Hemoglobin: 10 g/dL — ABNORMAL LOW (ref 12.0–15.0)

## 2017-09-23 LAB — FERRITIN: Ferritin: 544 ng/mL — ABNORMAL HIGH (ref 11–307)

## 2017-09-23 MED ORDER — DARBEPOETIN ALFA 200 MCG/0.4ML IJ SOSY
200.0000 ug | PREFILLED_SYRINGE | INTRAMUSCULAR | Status: DC
  Administered 2017-09-23: 200 ug via SUBCUTANEOUS

## 2017-09-23 MED ORDER — DARBEPOETIN ALFA 200 MCG/0.4ML IJ SOSY
PREFILLED_SYRINGE | INTRAMUSCULAR | Status: AC
  Administered 2017-09-23: 200 ug via SUBCUTANEOUS
  Filled 2017-09-23: qty 0.4

## 2017-09-26 ENCOUNTER — Ambulatory Visit
Admission: RE | Admit: 2017-09-26 | Discharge: 2017-09-26 | Disposition: A | Discharge status: Home / Self Care | Payer: BLUE CROSS/BLUE SHIELD | Source: Ambulatory Visit | Attending: Advanced Practice Midwife | Admitting: Advanced Practice Midwife

## 2017-09-26 ENCOUNTER — Ambulatory Visit: Payer: BLUE CROSS/BLUE SHIELD

## 2017-09-26 DIAGNOSIS — N6320 Unspecified lump in the left breast, unspecified quadrant: Secondary | ICD-10-CM

## 2017-10-04 ENCOUNTER — Encounter (HOSPITAL_COMMUNITY): Payer: BLUE CROSS/BLUE SHIELD

## 2017-10-21 ENCOUNTER — Ambulatory Visit (HOSPITAL_COMMUNITY)
Admission: RE | Admit: 2017-10-21 | Discharge: 2017-10-21 | Disposition: A | Discharge status: Home / Self Care | Payer: BLUE CROSS/BLUE SHIELD | Source: Ambulatory Visit | Attending: Nephrology | Admitting: Nephrology

## 2017-10-21 VITALS — BP 139/83 | HR 80 | Temp 97.8°F | Resp 20

## 2017-10-21 DIAGNOSIS — N185 Chronic kidney disease, stage 5: Secondary | ICD-10-CM | POA: Diagnosis not present

## 2017-10-21 DIAGNOSIS — N184 Chronic kidney disease, stage 4 (severe): Secondary | ICD-10-CM

## 2017-10-21 DIAGNOSIS — D631 Anemia in chronic kidney disease: Secondary | ICD-10-CM | POA: Insufficient documentation

## 2017-10-21 LAB — IRON AND TIBC
IRON: 75 ug/dL (ref 28–170)
SATURATION RATIOS: 30 % (ref 10.4–31.8)
TIBC: 253 ug/dL (ref 250–450)
UIBC: 178 ug/dL

## 2017-10-21 LAB — FERRITIN: Ferritin: 555 ng/mL — ABNORMAL HIGH (ref 11–307)

## 2017-10-21 LAB — POCT HEMOGLOBIN-HEMACUE: HEMOGLOBIN: 10 g/dL — AB (ref 12.0–15.0)

## 2017-10-21 MED ORDER — DARBEPOETIN ALFA 200 MCG/0.4ML IJ SOSY
200.0000 ug | PREFILLED_SYRINGE | INTRAMUSCULAR | Status: DC
  Administered 2017-10-21: 200 ug via SUBCUTANEOUS

## 2017-10-21 MED ORDER — DARBEPOETIN ALFA 200 MCG/0.4ML IJ SOSY
PREFILLED_SYRINGE | INTRAMUSCULAR | Status: AC
  Filled 2017-10-21: qty 0.4

## 2017-10-28 ENCOUNTER — Encounter (HOSPITAL_COMMUNITY): Payer: Self-pay | Admitting: Emergency Medicine

## 2017-10-28 ENCOUNTER — Emergency Department (HOSPITAL_COMMUNITY)
Admission: EM | Admit: 2017-10-28 | Discharge: 2017-10-29 | Disposition: A | Discharge status: Home / Self Care | Payer: BLUE CROSS/BLUE SHIELD | Attending: Emergency Medicine | Admitting: Emergency Medicine

## 2017-10-28 ENCOUNTER — Other Ambulatory Visit: Payer: Self-pay

## 2017-10-28 DIAGNOSIS — M79604 Pain in right leg: Secondary | ICD-10-CM | POA: Diagnosis not present

## 2017-10-28 DIAGNOSIS — L299 Pruritus, unspecified: Secondary | ICD-10-CM | POA: Insufficient documentation

## 2017-10-28 DIAGNOSIS — N185 Chronic kidney disease, stage 5: Secondary | ICD-10-CM | POA: Diagnosis not present

## 2017-10-28 DIAGNOSIS — Z7902 Long term (current) use of antithrombotics/antiplatelets: Secondary | ICD-10-CM | POA: Insufficient documentation

## 2017-10-28 DIAGNOSIS — I509 Heart failure, unspecified: Secondary | ICD-10-CM | POA: Diagnosis not present

## 2017-10-28 DIAGNOSIS — I132 Hypertensive heart and chronic kidney disease with heart failure and with stage 5 chronic kidney disease, or end stage renal disease: Secondary | ICD-10-CM | POA: Insufficient documentation

## 2017-10-28 DIAGNOSIS — Z79899 Other long term (current) drug therapy: Secondary | ICD-10-CM | POA: Diagnosis not present

## 2017-10-28 NOTE — ED Triage Notes (Signed)
Pt presents to ED for assessment of mass to right calf area x 2 weeks.  Does not recall being bit, but concerned for infection.   Painful with palpation.  No discharge, no fever/chills

## 2017-10-28 NOTE — Discharge Instructions (Signed)
Apply heat to the area Please follow up with your doctor Return if you are worsening

## 2017-10-29 NOTE — ED Provider Notes (Signed)
[place] EMERGENCY DEPARTMENT Provider Note   CSN: [id number] Arrival date & time: 10/28/17  2155     History   Chief Complaint Chief Complaint  Patient presents with  . Leg Pain    HPI [name] is a 42 y.o. female who presents with right leg pain. PMH significant for CKD, HTN, HLD, CHF, and multiple other medical problems as listed below. She states that she noticed an area on her right inner calf 2 weeks ago because it was itchy. She has been putting ointment on it which has improved the itchiness but it is also hard. She became worried it might be a spider bite and wanted to get it checked out. She doesn't remember injuring the area. She did not see a spider bite her.  HPI  Past Medical History:  Diagnosis Date  . Anemia   . Anxiety    pt not aware of this  . Arthritis   . CHF (congestive heart failure) ([place])    hosp. 2011  . Chronic kidney disease    not on dialysis at this time  . Dilated cardiomyopathy ([place])   . Encounter for blood transfusion   . Family history of adverse reaction to anesthesia     " Aunt has a tracheostomy and kept coughing after colonoscopy "  . Foot fracture   . Gout   . Heart murmur    as a child  . History of blood transfusion   . History of pneumonia   . Hyperlipidemia   . Hypertension   . Menorrhagia   . Morbid obesity ([place])   . Pneumonia    hosp. 2010  . Shortness of breath    seldom  . Thyroid disease     Patient Active Problem List   Diagnosis Date Noted  . Breast mass 09/01/2016  . Anemia in chronic kidney disease (CKD) 07/31/2015  . Itching-Right upper arm 05/17/2014  . Leucocytosis 02/28/2014  . Clotted dialysis access [facility])   . Symptomatic anemia 02/27/2014  . Severe anemia 02/27/2014  . Menorrhagia with irregular cycle 02/27/2014  . Essential hypertension 02/27/2014  . 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 Leukocytosis, unspecified 01/28/2013  . Anemia  01/27/2013  . CKD (chronic kidney disease) stage 5, GFR less than 15 ml/min (HCC) 01/27/2013  . Syncope 01/27/2013  . Menorrhagia 01/27/2013  . HYPERLIPIDEMIA-MIXED 04/20/2008  . OBESITY-MORBID (>100') 04/20/2008  . MITRAL REGURGITATION 04/20/2008  . HYPERTENSION, UNSPECIFIED 04/20/2008  . CARDIOMYOPATHY, DILATED 04/20/2008  . SYSTOLIC HEART FAILURE, ACUTE 04/20/2008  . CHRONIC KIDNEY DISEASE STAGE III (MODERATE) 04/20/2008    Past Surgical History:  Procedure Laterality Date  . ANGIOPLASTY  07/28/2015   Procedure: ANGIOPLASTY OF RIGHT BRACHIAL VEIN;  Surgeon: Conrad La Crosse, MD;  Location: [place];  Service: Vascular;;  . AV FISTULA PLACEMENT  08/04/2011   Procedure: ARTERIOVENOUS (AV) FISTULA CREATION;  Surgeon: Elam Dutch, MD;  Location: [facility] OR;  Service: Vascular;  Laterality: Right;  Creation right brachiocephalic arteriovenous fistula  . AV FISTULA PLACEMENT Left 02/05/2013   Procedure: ARTERIOVENOUS FISTULA CREATION LEFT ARM;  Surgeon: Conrad Riverdale, MD;  Location: [place];  Service: Vascular;  Laterality: Left;  . AV FISTULA PLACEMENT Right 06/04/2013   Procedure: ARTERIOVENOUS (AV) FISTULA CREATION;  Surgeon: Conrad [place] Waterford, MD;  Location: [place];  Service: Vascular;  Laterality: Right;  . AV FISTULA PLACEMENT Right 04/15/2014   Procedure: INSERTION OF ARTERIOVENOUS (AV) GORE-TEX GRAFT ARM;  Surgeon: Aaron Edelman  Starlyn Skeans, MD;  Location: [place];  Service: Vascular;  Laterality: Right;  . BASCILIC VEIN TRANSPOSITION Right 08/13/2013   Procedure: Second Stage Brachial Vein Transposition;  Surgeon: Conrad Brookmont, MD;  Location: [place];  Service: Vascular;  Laterality: Right;  . CARDIAC SURGERY  1979   repair of hole in heart  . INCISION AND DRAINAGE OF WOUND Right 07/28/2015   Procedure: IRRIGATION AND DEBRIDEMENT RIGHT HAND  WOUND;  Surgeon: Dayna Barker, MD;  Location: [place];  Service: Plastics;  Laterality: Right;  . LIGATION OF ARTERIOVENOUS  FISTULA Left 03/05/2013   Procedure: LIGATION OF  ARTERIOVENOUS  FISTULA;  Surgeon: Conrad Chester, MD;  Location: [place];  Service: Vascular;  Laterality: Left;  . PATCH ANGIOPLASTY Left 03/05/2013   Procedure: PATCH ANGIOPLASTY;  Surgeon: Conrad Lake George, MD;  Location: [place];  Service: Vascular;  Laterality: Left;  . PERIPHERAL VASCULAR CATHETERIZATION Right 10/31/2014   Procedure: Fistulagram;  Surgeon: Conrad Garcon Point, MD;  Location: [place] CV LAB;  Service: Cardiovascular;  Laterality: Right;  . PERIPHERAL VASCULAR CATHETERIZATION Right 10/31/2014   Procedure: Peripheral Vascular Balloon Angioplasty;  Surgeon: Conrad McLain, MD;  Location: [place] CV LAB;  Service: Cardiovascular;  Laterality: Right;  pta venous rt arm  . PERIPHERAL VASCULAR CATHETERIZATION N/A 03/13/2015   Procedure: A/V Shuntogram;  Surgeon: Conrad Harlem, MD;  Location: [place] CV LAB;  Service: Cardiovascular;  Laterality: N/A;  . PERIPHERAL VASCULAR CATHETERIZATION Right 03/13/2015   Procedure: Peripheral Vascular Balloon Angioplasty;  Surgeon: Conrad Lake Ivanhoe, MD;  Location: [place] CV LAB;  Service: Cardiovascular;  Laterality: Right;  Shunt  . PERIPHERAL VASCULAR CATHETERIZATION Right 01/19/2016   Procedure: A/V Shuntogram;  Surgeon: Angelia Mould, MD;  Location: [place] CV LAB;  Service: Cardiovascular;  Laterality: Right;  . REVISION OF ARTERIOVENOUS GORETEX GRAFT Right 10/27/2015   Procedure: THROMBECTOMY AND REVISION OF RIGHT UPPER ARM  ARTERIOVENOUS GORETEX GRAFT USING 6MM X 10CM GORE-TEX GRAFT;  Surgeon: Elam Dutch, MD;  Location: [place];  Service: Vascular;  Laterality: Right;  . REVISION OF ARTERIOVENOUS GORETEX GRAFT Right 01/30/2016   Procedure: REVISION OF RIGHT UPPER ARM ARTERIOVENOUS USING GORETEX GRAFT;  Surgeon: Angelia Mould, MD;  Location: [place];  Service: Vascular;  Laterality: Right;  . THROMBECTOMY W/ EMBOLECTOMY Left 03/05/2013   Procedure: THROMBECTOMY OF LEFT BRACHIAL ARTERY;  Surgeon: Conrad Mount Angel, MD;  Location: [place];   Service: Vascular;  Laterality: Left;  Marland Kitchen VENOGRAM Bilateral 03/25/2014   Procedure: VENOGRAM bilateral;  Surgeon: Conrad Casmalia, MD;  Location: [facility] CATH LAB;  Service: Cardiovascular;  Laterality: Bilateral;     OB History   None      Home Medications    Prior to Admission medications   Medication Sig Start Date End Date Taking? Authorizing Provider  acetaminophen (TYLENOL) 500 MG tablet Take 1,000 mg by mouth every 6 (six) hours as needed (for pain.).    [provider]  allopurinol (ZYLOPRIM) 100 MG tablet Take 200 mg by mouth daily.     [provider]  calcitRIOL (ROCALTROL) 0.5 MCG capsule Take 0.5 mcg by mouth daily.     [provider]  carvedilol (COREG) 25 MG tablet Take 25 mg by mouth 2 (two) times daily.     [provider]  doxycycline (VIBRAMYCIN) 100 MG capsule Take 1 capsule (100 mg total) by mouth 2 (two) times daily. 05/11/16   Junius Creamer, NP  famotidine (PEPCID) 20  MG tablet Take 1 tablet (20 mg total) by mouth 2 (two) times daily. 05/11/16   Junius Creamer, NP  ferrous sulfate 325 (65 FE) MG EC tablet Take 2 tablets (650 mg total) by mouth daily with breakfast. 01/30/16   Alvia Grove, PA-C  furosemide (LASIX) 80 MG tablet Take 80 mg by mouth 2 (two) times daily.    [provider]  hydrALAZINE (APRESOLINE) 100 MG tablet Take 100 mg by mouth 3 (three) times daily.  05/30/13   [provider]  megestrol (MEGACE) 20 MG tablet Take 2 tablets (40 mg total) by mouth daily. 01/30/16   Alvia Grove, PA-C  megestrol (MEGACE) 20 MG tablet TAKE 1 TABLET(20 MG) BY MOUTH TWICE DAILY 06/21/16   Constant, Peggy, MD  oxyCODONE-acetaminophen (PERCOCET/ROXICET) 5-325 MG tablet Take 1 tablet by mouth every 6 (six) hours as needed. 01/30/16   Alvia Grove, PA-C  pravastatin (PRAVACHOL) 40 MG tablet Take 40 mg by mouth daily.  06/01/13   [provider]  PROAIR HFA 108 (90 Base) MCG/ACT inhaler Inhale 1-2 puffs into the lungs  3 (three) times daily as needed for wheezing or shortness of breath. For wheezing. 09/04/15   [provider]  RENVELA 800 MG tablet Take 1,600 mg by mouth 3 (three) times daily with meals. And snacks 09/04/13   [provider]    Family History Family History  Problem Relation Age of Onset  . Diabetes Mother   . Hyperlipidemia Mother   . Hypertension Mother   . Kidney disease Mother   . Anesthesia problems Neg Hx   . Hypotension Neg Hx   . Malignant hyperthermia Neg Hx   . Pseudochol deficiency Neg Hx     Social History Social History   Tobacco Use  . Smoking status: Never Smoker  . Smokeless tobacco: Never Used  Substance Use Topics  . Alcohol use: No    Alcohol/week: 0.0 standard drinks  . Drug use: No     Allergies   Patient has no known allergies.   Review of Systems Review of Systems  Musculoskeletal: Positive for myalgias.  Skin: Positive for color change. Negative for wound.     Physical Exam Updated Vital Signs BP (!) 170/87   Pulse 78   Temp 98.6 F (37 C) (Oral)   Resp 20   SpO2 99%   Physical Exam  Constitutional: She is oriented to person, place, and time. She appears well-developed and well-nourished. No distress.  HENT:  Head: Normocephalic and atraumatic.  Eyes: Pupils are equal, round, and reactive to light. Conjunctivae are normal. Right eye exhibits no discharge. Left eye exhibits no discharge. No scleral icterus.  Neck: Normal range of motion.  Cardiovascular: Normal rate.  Pulmonary/Chest: Effort normal. No respiratory distress.  Abdominal: She exhibits no distension.  Neurological: She is alert and oriented to person, place, and time.  Skin: Skin is warm and dry.  3x3 bruised area over the right calf which is indurated. No wound, drainage, redness, significant tenderness.    Psychiatric: She has a normal mood and affect. Her behavior is normal.  Nursing note and vitals reviewed.    ED Treatments / Results   Labs (all labs ordered are listed, but only abnormal results are displayed) Labs Reviewed - No data to display  EKG None  Radiology No results found.  Procedures Procedures (including critical care time)  Medications Ordered in ED Medications - No data to display   Initial Impression / Assessment and  Plan / ED Course  I have reviewed the triage vital signs and the nursing notes.  Pertinent labs & imaging results that were available during my care of the patient were reviewed by me and considered in my medical decision making (see chart for details).  42 year old female presents with right leg pain due to a skin nodule on the calf. Unclear etiology. It doesn't look like a bite, infection, abscess. It looks most consistent with a bruise and underlying small hematoma. She was given reassurance and advised to apply heat and follow up with her doctor.  Final Clinical Impressions(s) / ED Diagnoses   Final diagnoses:  Right leg pain    ED Discharge Orders    None       Recardo Evangelist, PA-C 10/29/17 0026    Nat Christen, MD 10/29/17 1630

## 2017-11-01 ENCOUNTER — Encounter: Payer: Self-pay | Admitting: Advanced Practice Midwife

## 2017-11-01 ENCOUNTER — Ambulatory Visit (INDEPENDENT_AMBULATORY_CARE_PROVIDER_SITE_OTHER): Payer: BLUE CROSS/BLUE SHIELD | Admitting: Advanced Practice Midwife

## 2017-11-01 VITALS — BP 153/88 | HR 82 | Wt 318.4 lb

## 2017-11-01 DIAGNOSIS — Z01419 Encounter for gynecological examination (general) (routine) without abnormal findings: Secondary | ICD-10-CM

## 2017-11-01 DIAGNOSIS — Z124 Encounter for screening for malignant neoplasm of cervix: Secondary | ICD-10-CM

## 2017-11-01 DIAGNOSIS — Z113 Encounter for screening for infections with a predominantly sexual mode of transmission: Secondary | ICD-10-CM | POA: Diagnosis not present

## 2017-11-01 DIAGNOSIS — B379 Candidiasis, unspecified: Secondary | ICD-10-CM

## 2017-11-01 DIAGNOSIS — Z1151 Encounter for screening for human papillomavirus (HPV): Secondary | ICD-10-CM

## 2017-11-01 DIAGNOSIS — N921 Excessive and frequent menstruation with irregular cycle: Secondary | ICD-10-CM

## 2017-11-01 MED ORDER — NYSTATIN 100000 UNIT/GM EX POWD: Freq: Two times a day (BID) | CUTANEOUS | 2 refills | Status: DC

## 2017-11-01 MED ORDER — MEGESTROL ACETATE 20 MG PO TABS: 40.0000 mg | ORAL_TABLET | Freq: Every day | ORAL | 11 refills | Status: AC

## 2017-11-01 NOTE — Patient Instructions (Signed)
Skin Yeast Infection Skin yeast infection is a condition in which there is an overgrowth of yeast (candida) that normally lives on the skin. This condition usually occurs in areas of the skin that are constantly warm and moist, such as the armpits or the groin. What are the causes? This condition is caused by a change in the normal balance of the yeast and bacteria that live on the skin. What increases the risk? This condition is more likely to develop in:  People who are obese.  Pregnant women.  Women who take birth control pills.  People who have diabetes.  People who take antibiotic medicines.  People who take steroid medicines.  People who are malnourished.  People who have a weak defense (immune) system.  People who are 65 years of age or older.  What are the signs or symptoms? Symptoms of this condition include:  A red, swollen area of the skin.  Bumps on the skin.  Itchiness.  How is this diagnosed? This condition is diagnosed with a medical history and physical exam. Your health care provider may check for yeast by taking light scrapings of the skin to be viewed under a microscope. How is this treated? This condition is treated with medicine. Medicines may be prescribed or be available over-the-counter. The medicines may be:  Taken by mouth (orally).  Applied as a cream.  Follow these instructions at home:  Take or apply over-the-counter and prescription medicines only as told by your health care provider.  Eat more yogurt. This may help to keep your yeast infection from returning.  Maintain a healthy weight. If you need help losing weight, talk with your health care provider.  Keep your skin clean and dry.  If you have diabetes, keep your blood sugar under control. Contact a health care provider if:  Your symptoms go away and then return.  Your symptoms do not get better with treatment.  Your symptoms get worse.  Your rash spreads.  You have a  fever or chills.  You have new symptoms.  You have new warmth or redness of your skin. This information is not intended to replace advice given to you by your health care provider. Make sure you discuss any questions you have with your health care provider. Document Released: 09/08/2010 Document Revised: 08/17/2015 Document Reviewed: 06/24/2014 Elsevier Interactive Patient Education  2018 Elsevier Inc.  

## 2017-11-01 NOTE — Progress Notes (Signed)
Subjective:     [name] is a 42 y.o. female here for a routine exam.  Current complaints: none. She does not have menses on Megace and desires to continue this medication. When she does have periods they are heavy and painful.  She denies any gyn concerns but does have flaky itching skin under her breasts and in her low abdomen that comes and goes. A&D ointment helps but it returns.  Personal health questionnaire reviewed: yes.  She has primary care and a nephrologist and is followed closely by both.   Gynecologic History No LMP recorded. Patient is perimenopausal. Contraception: abstinence Last Pap: 2017. Results were: normal Last mammogram: 09/26/17. Results were: normal  Obstetric History OB History  No data available     The following portions of the patient's history were reviewed and updated as appropriate: allergies, current medications, past family history, past medical history, past social history, past surgical history and problem list.  Review of Systems Pertinent items noted in HPI and remainder of comprehensive ROS otherwise negative.    Objective:   BP (!) 153/88   Pulse 82   Wt (!) 144.4 kg   BMI 46.35 kg/m    VS reviewed, nursing note reviewed,  Constitutional: well developed, well nourished, no distress HEENT: normocephalic CV: normal rate Pulm/chest wall: normal effort Breast Exam:  Deferred, done earlier this year. Mammogram 1 month ago, results wnl. Abdomen: soft Neuro: alert and oriented x 3 Skin: warm, dry Psych: affect normal Pelvic exam: Cervix pink, visually closed, without lesion, scant white creamy discharge, vaginal walls and external genitalia normal Bimanual exam: Cervix 0/long/high, firm, anterior, neg CMT, uterus nontender, nonenlarged, adnexa without tenderness, enlargement, or mass      Assessment/Plan:   1. Encounter for well woman exam - Cytology - PAP  2. Candida infection --On exam, flaky white skin noted under both  breasts and on lower abdomen. No edema or erythema. - nystatin (MYCOSTATIN/NYSTOP) powder; Apply topically 2 (two) times daily.  Dispense: 15 g; Refill: 2  3. Menorrhagia with irregular cycle --No AUB with meds. Pt desires to continue Megace. Discussed other treatments for AUB including IUD and ablation. Pt prefers to continue Megace. - megestrol (MEGACE) 20 MG tablet; Take 2 tablets (40 mg total) by mouth daily.  Dispense: 56 tablet; Refill: 11    Contraception: abstinence  Follow up in: 1 year or as needed.   Fatima Blank, CNM 12:10 PM

## 2017-11-02 ENCOUNTER — Telehealth: Payer: Self-pay

## 2017-11-02 DIAGNOSIS — Z01419 Encounter for gynecological examination (general) (routine) without abnormal findings: Secondary | ICD-10-CM

## 2017-11-02 MED ORDER — NYSTATIN-TRIAMCINOLONE 100000-0.1 UNIT/GM-% EX OINT: 1.0000 "application " | TOPICAL_OINTMENT | Freq: Two times a day (BID) | CUTANEOUS | 0 refills | Status: DC

## 2017-11-02 NOTE — Telephone Encounter (Signed)
Called pt to inform her that we sent her Rx to her pharmacy no answer and left voicemail advising that a rx was sent to her pharmacy on file and to call us if she has any questions.

## 2017-11-02 NOTE — Telephone Encounter (Signed)
Pt called stating that she was suppose to get an Rx for Nystatin for underneath her breast and it was not at her pharmacy.  Can she get called into [facility] on [address].

## 2017-11-03 ENCOUNTER — Telehealth: Payer: Self-pay | Admitting: *Deleted

## 2017-11-03 NOTE — Telephone Encounter (Signed)
Kehinde left a message that she saw [place] on Tuesday and she was supposed to send in rx for yeast under her breast.  Per chart nystatin was sent to [facility] on 10/29. I called Lekisha and informed her rx was sent on 10/29 to [facility] . She states she thought it went to [facility].  She states she will pick up at [facility] or call them to transfer to [facility]. She voices understanding.

## 2017-11-08 LAB — CYTOLOGY - PAP
Chlamydia: NEGATIVE
Diagnosis: NEGATIVE
HPV (WINDOPATH): NOT DETECTED
NEISSERIA GONORRHEA: NEGATIVE

## 2017-11-18 ENCOUNTER — Encounter (HOSPITAL_COMMUNITY): Payer: BLUE CROSS/BLUE SHIELD

## 2017-11-25 ENCOUNTER — Telehealth: Payer: Self-pay

## 2017-11-25 ENCOUNTER — Ambulatory Visit (HOSPITAL_COMMUNITY)
Admission: RE | Admit: 2017-11-25 | Discharge: 2017-11-25 | Disposition: A | Discharge status: Home / Self Care | Payer: BLUE CROSS/BLUE SHIELD | Source: Ambulatory Visit | Attending: Nephrology | Admitting: Nephrology

## 2017-11-25 VITALS — BP 147/80 | HR 87 | Temp 98.4°F | Resp 20

## 2017-11-25 DIAGNOSIS — D631 Anemia in chronic kidney disease: Secondary | ICD-10-CM | POA: Insufficient documentation

## 2017-11-25 DIAGNOSIS — N184 Chronic kidney disease, stage 4 (severe): Secondary | ICD-10-CM | POA: Insufficient documentation

## 2017-11-25 LAB — IRON AND TIBC
IRON: 73 ug/dL (ref 28–170)
Saturation Ratios: 28 % (ref 10.4–31.8)
TIBC: 263 ug/dL (ref 250–450)
UIBC: 190 ug/dL

## 2017-11-25 LAB — FERRITIN: FERRITIN: 633 ng/mL — AB (ref 11–307)

## 2017-11-25 LAB — POCT HEMOGLOBIN-HEMACUE: Hemoglobin: 9.7 g/dL — ABNORMAL LOW (ref 12.0–15.0)

## 2017-11-25 MED ORDER — DARBEPOETIN ALFA 200 MCG/0.4ML IJ SOSY
PREFILLED_SYRINGE | INTRAMUSCULAR | Status: AC
  Administered 2017-11-25: 200 ug via SUBCUTANEOUS
  Filled 2017-11-25: qty 0.4

## 2017-11-25 MED ORDER — DARBEPOETIN ALFA 200 MCG/0.4ML IJ SOSY
200.0000 ug | PREFILLED_SYRINGE | INTRAMUSCULAR | Status: DC
  Administered 2017-11-25: 200 ug via SUBCUTANEOUS

## 2017-11-25 NOTE — Telephone Encounter (Signed)
Attempted to call pt to see if she is still a pt of this practice we have received office notes from [facility]. Left v/m to call office [facility]

## 2017-12-16 ENCOUNTER — Encounter (HOSPITAL_COMMUNITY): Payer: BLUE CROSS/BLUE SHIELD

## 2017-12-22 ENCOUNTER — Other Ambulatory Visit (HOSPITAL_COMMUNITY): Payer: Self-pay

## 2017-12-23 ENCOUNTER — Encounter (HOSPITAL_COMMUNITY): Payer: BLUE CROSS/BLUE SHIELD

## 2017-12-27 ENCOUNTER — Ambulatory Visit (HOSPITAL_COMMUNITY)
Admission: RE | Admit: 2017-12-27 | Discharge: 2017-12-27 | Disposition: A | Discharge status: Home / Self Care | Payer: BLUE CROSS/BLUE SHIELD | Source: Ambulatory Visit | Attending: Nephrology | Admitting: Nephrology

## 2017-12-27 VITALS — BP 145/80 | HR 76 | Resp 20

## 2017-12-27 DIAGNOSIS — N184 Chronic kidney disease, stage 4 (severe): Secondary | ICD-10-CM | POA: Insufficient documentation

## 2017-12-27 DIAGNOSIS — D631 Anemia in chronic kidney disease: Secondary | ICD-10-CM | POA: Insufficient documentation

## 2017-12-27 LAB — IRON AND TIBC
Iron: 83 ug/dL (ref 28–170)
SATURATION RATIOS: 32 % — AB (ref 10.4–31.8)
TIBC: 256 ug/dL (ref 250–450)
UIBC: 173 ug/dL

## 2017-12-27 LAB — FERRITIN: FERRITIN: 764 ng/mL — AB (ref 11–307)

## 2017-12-27 MED ORDER — DARBEPOETIN ALFA 300 MCG/0.6ML IJ SOSY
225.0000 ug | PREFILLED_SYRINGE | INTRAMUSCULAR | Status: DC
  Administered 2017-12-27: 225 ug via SUBCUTANEOUS

## 2017-12-27 MED ORDER — DARBEPOETIN ALFA 200 MCG/0.4ML IJ SOSY
PREFILLED_SYRINGE | INTRAMUSCULAR | Status: AC
  Filled 2017-12-27: qty 0.4

## 2017-12-27 MED ORDER — DARBEPOETIN ALFA 25 MCG/0.42ML IJ SOSY
PREFILLED_SYRINGE | INTRAMUSCULAR | Status: AC
  Filled 2017-12-27: qty 0.42

## 2017-12-29 MED FILL — Darbepoetin Alfa Soln Prefilled Syringe 25 MCG/0.42ML: INTRAMUSCULAR | Qty: 0.42 | Status: AC

## 2017-12-29 MED FILL — Darbepoetin Alfa Soln Prefilled Syringe 200 MCG/0.4ML: INTRAMUSCULAR | Qty: 0.4 | Status: AC

## 2017-12-30 LAB — POCT HEMOGLOBIN-HEMACUE: HEMOGLOBIN: 10 g/dL — AB (ref 12.0–15.0)

## 2018-01-20 ENCOUNTER — Encounter (HOSPITAL_COMMUNITY): Payer: BLUE CROSS/BLUE SHIELD

## 2018-01-24 ENCOUNTER — Encounter (HOSPITAL_COMMUNITY)
Admission: RE | Admit: 2018-01-24 | Discharge: 2018-01-24 | Disposition: A | Discharge status: Home / Self Care | Payer: BLUE CROSS/BLUE SHIELD | Source: Ambulatory Visit | Attending: Nephrology | Admitting: Nephrology

## 2018-01-24 VITALS — BP 139/81 | HR 80 | Temp 98.0°F | Resp 20

## 2018-01-24 DIAGNOSIS — D631 Anemia in chronic kidney disease: Secondary | ICD-10-CM | POA: Diagnosis not present

## 2018-01-24 DIAGNOSIS — N185 Chronic kidney disease, stage 5: Secondary | ICD-10-CM | POA: Diagnosis not present

## 2018-01-24 DIAGNOSIS — N184 Chronic kidney disease, stage 4 (severe): Secondary | ICD-10-CM

## 2018-01-24 LAB — POCT HEMOGLOBIN-HEMACUE: HEMOGLOBIN: 9.8 g/dL — AB (ref 12.0–15.0)

## 2018-01-24 LAB — IRON AND TIBC
Iron: 127 ug/dL (ref 28–170)
Saturation Ratios: 47 % — ABNORMAL HIGH (ref 10.4–31.8)
TIBC: 267 ug/dL (ref 250–450)
UIBC: 140 ug/dL

## 2018-01-24 LAB — FERRITIN: Ferritin: 762 ng/mL — ABNORMAL HIGH (ref 11–307)

## 2018-01-24 MED ORDER — DARBEPOETIN ALFA 200 MCG/0.4ML IJ SOSY
PREFILLED_SYRINGE | INTRAMUSCULAR | Status: AC
  Filled 2018-01-24: qty 0.4

## 2018-01-24 MED ORDER — DARBEPOETIN ALFA 25 MCG/0.42ML IJ SOSY
PREFILLED_SYRINGE | INTRAMUSCULAR | Status: AC
  Filled 2018-01-24: qty 0.42

## 2018-01-24 MED ORDER — DARBEPOETIN ALFA 300 MCG/0.6ML IJ SOSY
225.0000 ug | PREFILLED_SYRINGE | INTRAMUSCULAR | Status: DC
  Administered 2018-01-24: 225 ug via SUBCUTANEOUS

## 2018-01-25 MED FILL — Darbepoetin Alfa Soln Prefilled Syringe 25 MCG/0.42ML: INTRAMUSCULAR | Qty: 0.42 | Status: AC

## 2018-01-25 MED FILL — Darbepoetin Alfa Soln Prefilled Syringe 200 MCG/0.4ML: INTRAMUSCULAR | Qty: 0.4 | Status: AC

## 2018-02-07 ENCOUNTER — Ambulatory Visit (HOSPITAL_COMMUNITY)
Admission: RE | Admit: 2018-02-07 | Discharge: 2018-02-07 | Disposition: A | Discharge status: Home / Self Care | Payer: BLUE CROSS/BLUE SHIELD | Source: Ambulatory Visit | Attending: Nephrology | Admitting: Nephrology

## 2018-02-07 VITALS — BP 151/78 | HR 78 | Temp 98.2°F | Resp 20

## 2018-02-07 DIAGNOSIS — N184 Chronic kidney disease, stage 4 (severe): Secondary | ICD-10-CM | POA: Insufficient documentation

## 2018-02-07 DIAGNOSIS — D631 Anemia in chronic kidney disease: Secondary | ICD-10-CM | POA: Insufficient documentation

## 2018-02-07 LAB — POCT HEMOGLOBIN-HEMACUE: Hemoglobin: 9.8 g/dL — ABNORMAL LOW (ref 12.0–15.0)

## 2018-02-07 MED ORDER — DARBEPOETIN ALFA 200 MCG/0.4ML IJ SOSY
PREFILLED_SYRINGE | INTRAMUSCULAR | Status: AC
  Filled 2018-02-07: qty 0.4

## 2018-02-07 MED ORDER — DARBEPOETIN ALFA 300 MCG/0.6ML IJ SOSY
225.0000 ug | PREFILLED_SYRINGE | INTRAMUSCULAR | Status: DC
  Administered 2018-02-07: 225 ug via SUBCUTANEOUS

## 2018-02-07 MED ORDER — DARBEPOETIN ALFA 25 MCG/0.42ML IJ SOSY
PREFILLED_SYRINGE | INTRAMUSCULAR | Status: AC
  Filled 2018-02-07: qty 0.42

## 2018-02-08 MED FILL — Darbepoetin Alfa Soln Prefilled Syringe 200 MCG/0.4ML: INTRAMUSCULAR | Qty: 0.4 | Status: AC

## 2018-02-08 MED FILL — Darbepoetin Alfa Soln Prefilled Syringe 25 MCG/0.42ML: INTRAMUSCULAR | Qty: 0.42 | Status: AC

## 2018-02-14 DIAGNOSIS — N2581 Secondary hyperparathyroidism of renal origin: Secondary | ICD-10-CM | POA: Insufficient documentation

## 2018-02-15 DIAGNOSIS — J302 Other seasonal allergic rhinitis: Secondary | ICD-10-CM | POA: Insufficient documentation

## 2018-02-15 DIAGNOSIS — B369 Superficial mycosis, unspecified: Secondary | ICD-10-CM | POA: Insufficient documentation

## 2018-02-15 DIAGNOSIS — N6320 Unspecified lump in the left breast, unspecified quadrant: Secondary | ICD-10-CM | POA: Insufficient documentation

## 2018-02-15 DIAGNOSIS — Z6841 Body Mass Index (BMI) 40.0 and over, adult: Secondary | ICD-10-CM

## 2018-02-15 DIAGNOSIS — M109 Gout, unspecified: Secondary | ICD-10-CM | POA: Insufficient documentation

## 2018-02-17 DIAGNOSIS — D509 Iron deficiency anemia, unspecified: Secondary | ICD-10-CM | POA: Insufficient documentation

## 2018-02-21 ENCOUNTER — Encounter (HOSPITAL_COMMUNITY): Payer: BLUE CROSS/BLUE SHIELD

## 2018-02-23 ENCOUNTER — Other Ambulatory Visit: Payer: Self-pay | Admitting: Advanced Practice Midwife

## 2018-02-23 DIAGNOSIS — N921 Excessive and frequent menstruation with irregular cycle: Secondary | ICD-10-CM

## 2018-02-28 ENCOUNTER — Telehealth: Payer: Self-pay | Admitting: *Deleted

## 2018-02-28 ENCOUNTER — Encounter: Payer: Self-pay | Admitting: *Deleted

## 2018-02-28 DIAGNOSIS — N921 Excessive and frequent menstruation with irregular cycle: Secondary | ICD-10-CM

## 2018-02-28 MED ORDER — MEGESTROL ACETATE 20 MG PO TABS: 40.0000 mg | ORAL_TABLET | Freq: Every day | ORAL | 11 refills | Status: DC

## 2018-02-28 NOTE — Telephone Encounter (Signed)
Pt left voicemail stating that she was seen in office in October and was prescribed Megestrol. [facility] cannot find the prescription. Per chart review, the visit note from Fatima Blank state her intention to prescribe the medication however the Rx was not sent. Megace e-prescribed per Lisa's progress note. MyChart message sent to pt.

## 2018-03-07 ENCOUNTER — Encounter (HOSPITAL_COMMUNITY): Payer: BLUE CROSS/BLUE SHIELD

## 2018-09-05 ENCOUNTER — Other Ambulatory Visit: Payer: Self-pay | Admitting: Internal Medicine

## 2018-09-05 DIAGNOSIS — Z1231 Encounter for screening mammogram for malignant neoplasm of breast: Secondary | ICD-10-CM

## 2018-10-20 ENCOUNTER — Ambulatory Visit
Admission: RE | Admit: 2018-10-20 | Discharge: 2018-10-20 | Disposition: A | Discharge status: Home / Self Care | Payer: BC Managed Care – PPO | Source: Ambulatory Visit | Attending: Internal Medicine | Admitting: Internal Medicine

## 2018-10-20 ENCOUNTER — Other Ambulatory Visit: Payer: Self-pay

## 2018-10-20 DIAGNOSIS — Z1231 Encounter for screening mammogram for malignant neoplasm of breast: Secondary | ICD-10-CM

## 2018-12-04 ENCOUNTER — Other Ambulatory Visit: Payer: Self-pay

## 2018-12-04 ENCOUNTER — Emergency Department (HOSPITAL_COMMUNITY): Payer: BC Managed Care – PPO

## 2018-12-04 ENCOUNTER — Emergency Department (HOSPITAL_COMMUNITY)
Admission: EM | Admit: 2018-12-04 | Discharge: 2018-12-04 | Disposition: A | Discharge status: Home / Self Care | Payer: BC Managed Care – PPO | Attending: Emergency Medicine | Admitting: Emergency Medicine

## 2018-12-04 DIAGNOSIS — Z992 Dependence on renal dialysis: Secondary | ICD-10-CM | POA: Diagnosis not present

## 2018-12-04 DIAGNOSIS — R0981 Nasal congestion: Secondary | ICD-10-CM | POA: Diagnosis not present

## 2018-12-04 DIAGNOSIS — I5022 Chronic systolic (congestive) heart failure: Secondary | ICD-10-CM | POA: Insufficient documentation

## 2018-12-04 DIAGNOSIS — Z6841 Body Mass Index (BMI) 40.0 and over, adult: Secondary | ICD-10-CM | POA: Insufficient documentation

## 2018-12-04 DIAGNOSIS — R05 Cough: Secondary | ICD-10-CM

## 2018-12-04 DIAGNOSIS — E669 Obesity, unspecified: Secondary | ICD-10-CM | POA: Insufficient documentation

## 2018-12-04 DIAGNOSIS — I132 Hypertensive heart and chronic kidney disease with heart failure and with stage 5 chronic kidney disease, or end stage renal disease: Secondary | ICD-10-CM | POA: Diagnosis not present

## 2018-12-04 DIAGNOSIS — U071 COVID-19: Secondary | ICD-10-CM | POA: Diagnosis not present

## 2018-12-04 DIAGNOSIS — Z86718 Personal history of other venous thrombosis and embolism: Secondary | ICD-10-CM | POA: Diagnosis not present

## 2018-12-04 DIAGNOSIS — N186 End stage renal disease: Secondary | ICD-10-CM | POA: Insufficient documentation

## 2018-12-04 DIAGNOSIS — R059 Cough, unspecified: Secondary | ICD-10-CM

## 2018-12-04 LAB — CBC
HCT: 33.9 % — ABNORMAL LOW (ref 36.0–46.0)
Hemoglobin: 11.4 g/dL — ABNORMAL LOW (ref 12.0–15.0)
MCH: 35 pg — ABNORMAL HIGH (ref 26.0–34.0)
MCHC: 33.6 g/dL (ref 30.0–36.0)
MCV: 104 fL — ABNORMAL HIGH (ref 80.0–100.0)
Platelets: 163 10*3/uL (ref 150–400)
RBC: 3.26 MIL/uL — ABNORMAL LOW (ref 3.87–5.11)
RDW: 14.3 % (ref 11.5–15.5)
WBC: 8.7 10*3/uL (ref 4.0–10.5)
nRBC: 0 % (ref 0.0–0.2)

## 2018-12-04 LAB — BASIC METABOLIC PANEL
Anion gap: 12 (ref 5–15)
BUN: 48 mg/dL — ABNORMAL HIGH (ref 6–20)
CO2: 24 mmol/L (ref 22–32)
Calcium: 9.3 mg/dL (ref 8.9–10.3)
Chloride: 100 mmol/L (ref 98–111)
Creatinine, Ser: 11.41 mg/dL — ABNORMAL HIGH (ref 0.44–1.00)
GFR calc Af Amer: 4 mL/min — ABNORMAL LOW (ref >60)
GFR calc non Af Amer: 4 mL/min — ABNORMAL LOW (ref >60)
Glucose, Bld: 87 mg/dL (ref 70–99)
Potassium: 4.3 mmol/L (ref 3.5–5.1)
Sodium: 136 mmol/L (ref 135–145)

## 2018-12-04 LAB — SARS CORONAVIRUS 2 (TAT 6-24 HRS): SARS Coronavirus 2: POSITIVE — AB

## 2018-12-04 LAB — POC SARS CORONAVIRUS 2 AG -  ED: SARS Coronavirus 2 Ag: NEGATIVE

## 2018-12-04 NOTE — Discharge Instructions (Addendum)
Your initial rapid Covid was negative.  We have repeated a Covid test that is pending.  You can check on MyChart regarding the results.  You should quarantine until results come back.  Please get dialysis as scheduled today.  Return the emergency department for any worsening or concerning symptoms, difficulty breathing, vomiting.     Person Under Monitoring Name: [name]  Location: [place] [address]   Infection Prevention Recommendations for Individuals Confirmed to have, or Being Evaluated for, 2019 Novel Coronavirus (COVID-19) Infection Who Receive Care at Home  Individuals who are confirmed to have, or are being evaluated for, COVID-19 should follow the prevention steps below until a healthcare provider or local or state health department says they can return to normal activities.  Stay home except to get medical care You should restrict activities outside your home, except for getting medical care. Do not go to work, school, or public areas, and do not use public transportation or taxis.  Call ahead before visiting your doctor Before your medical appointment, call the healthcare provider and tell them that you have, or are being evaluated for, COVID-19 infection. This will help the healthcare providers office take steps to keep other people from getting infected. Ask your healthcare provider to call the local or state health department.  Monitor your symptoms Seek prompt medical attention if your illness is worsening (e.g., difficulty breathing). Before going to your medical appointment, call the healthcare provider and tell them that you have, or are being evaluated for, COVID-19 infection. Ask your healthcare provider to call the local or state health department.  Wear a facemask You should wear a facemask that covers your nose and mouth when you are in the same room with other people and when you visit a healthcare provider. People who live  with or visit you should also wear a facemask while they are in the same room with you.  Separate yourself from other people in your home As much as possible, you should stay in a different room from other people in your home. Also, you should use a separate bathroom, if available.  Avoid sharing household items You should not share dishes, drinking glasses, cups, eating utensils, towels, bedding, or other items with other people in your home. After using these items, you should wash them thoroughly with soap and water.  Cover your coughs and sneezes Cover your mouth and nose with a tissue when you cough or sneeze, or you can cough or sneeze into your sleeve. Throw used tissues in a lined trash can, and immediately wash your hands with soap and water for at least 20 seconds or use an alcohol-based hand rub.  Wash your [facility] your hands often and thoroughly with soap and water for at least 20 seconds. You can use an alcohol-based hand sanitizer if soap and water are not available and if your hands are not visibly dirty. Avoid touching your eyes, nose, and mouth with unwashed hands.   Prevention Steps for Caregivers and Household Members of Individuals Confirmed to have, or Being Evaluated for, COVID-19 Infection Being Cared for in the Home  If you live with, or provide care at home for, a person confirmed to have, or being evaluated for, COVID-19 infection please follow these guidelines to prevent infection:  Follow healthcare providers instructions Make sure that you understand and can help the patient follow any healthcare provider instructions for all care.  Provide for the patients basic needs You should help  the patient with basic needs in the home and provide support for getting groceries, prescriptions, and other personal needs.  Monitor the patients symptoms If they are getting sicker, call his or her medical provider and tell them that the patient has, or is being  evaluated for, COVID-19 infection. This will help the healthcare providers office take steps to keep other people from getting infected. Ask the healthcare provider to call the local or state health department.  Limit the number of people who have contact with the patient If possible, have only one caregiver for the patient. Other household members should stay in another home or place of residence. If this is not possible, they should stay in another room, or be separated from the patient as much as possible. Use a separate bathroom, if available. Restrict visitors who do not have an essential need to be in the home.  Keep older adults, very young children, and other sick people away from the patient Keep older adults, very young children, and those who have compromised immune systems or chronic health conditions away from the patient. This includes people with chronic heart, lung, or kidney conditions, diabetes, and cancer.  Ensure good ventilation Make sure that shared spaces in the home have good air flow, such as from an air conditioner or an opened window, weather permitting.  Wash your hands often Wash your hands often and thoroughly with soap and water for at least 20 seconds. You can use an alcohol based hand sanitizer if soap and water are not available and if your hands are not visibly dirty. Avoid touching your eyes, nose, and mouth with unwashed hands. Use disposable paper towels to dry your hands. If not available, use dedicated cloth towels and replace them when they become wet.  Wear a facemask and gloves Wear a disposable facemask at all times in the room and gloves when you touch or have contact with the patients blood, body fluids, and/or secretions or excretions, such as sweat, saliva, sputum, nasal mucus, vomit, urine, or feces.  Ensure the mask fits over your nose and mouth tightly, and do not touch it during use. Throw out disposable facemasks and gloves after using  them. Do not reuse. Wash your hands immediately after removing your facemask and gloves. If your personal clothing becomes contaminated, carefully remove clothing and launder. Wash your hands after handling contaminated clothing. Place all used disposable facemasks, gloves, and other waste in a lined container before disposing them with other household waste. Remove gloves and wash your hands immediately after handling these items.  Do not share dishes, glasses, or other household items with the patient Avoid sharing household items. You should not share dishes, drinking glasses, cups, eating utensils, towels, bedding, or other items with a patient who is confirmed to have, or being evaluated for, COVID-19 infection. After the person uses these items, you should wash them thoroughly with soap and water.  Wash laundry thoroughly Immediately remove and wash clothes or bedding that have blood, body fluids, and/or secretions or excretions, such as sweat, saliva, sputum, nasal mucus, vomit, urine, or feces, on them. Wear gloves when handling laundry from the patient. Read and follow directions on labels of laundry or clothing items and detergent. In general, wash and dry with the warmest temperatures recommended on the label.  Clean all areas the individual has used often Clean all touchable surfaces, such as counters, tabletops, doorknobs, bathroom fixtures, toilets, phones, keyboards, tablets, and bedside tables, every day. Also, clean any surfaces  that may have blood, body fluids, and/or secretions or excretions on them. Wear gloves when cleaning surfaces the patient has come in contact with. Use a diluted bleach solution (e.g., dilute bleach with 1 part bleach and 10 parts water) or a household disinfectant with a label that says EPA-registered for coronaviruses. To make a bleach solution at home, add 1 tablespoon of bleach to 1 quart (4 cups) of water. For a larger supply, add  cup of bleach to 1  gallon (16 cups) of water. Read labels of cleaning products and follow recommendations provided on product labels. Labels contain instructions for safe and effective use of the cleaning product including precautions you should take when applying the product, such as wearing gloves or eye protection and making sure you have good ventilation during use of the product. Remove gloves and wash hands immediately after cleaning.  Monitor yourself for signs and symptoms of illness Caregivers and household members are considered close contacts, should monitor their health, and will be asked to limit movement outside of the home to the extent possible. Follow the monitoring steps for close contacts listed on the symptom monitoring form.   ? If you have additional questions, contact your local health department or call the epidemiologist on call at [phone] (available 24/7). ? This guidance is subject to change. For the most up-to-date guidance from [facility], please refer to their website: [email or web address]

## 2018-12-04 NOTE — ED Provider Notes (Signed)
[facility] EMERGENCY DEPARTMENT Provider Note   CSN: [id number] Arrival date & time: 12/04/18  0944     History   Chief Complaint Chief Complaint  Patient presents with   Nasal Congestion    HPI [name] is a 43 y.o. female) CHF, chronic kidney disease (Monday, Wednesday, Friday dialysis (, dilated cardiomyopathy who presents for evaluation of nasal congestion and cough that began last night.  Patient reports that she went to [place], [place] for Thanksgiving.  She states that there was only a few people there.  She does not know of any known COVID-19 exposure.  She reports last night, she did have started developing a cough as well for nasal congestion.  Cough is nonproductive.  She has not noted any fevers, difficulty breathing.  She is scheduled to get dialysis today but they advised her to come to emergency department for COVID-19 testing.  She denies any chest pain, difficulty breathing.     The history is provided by the patient.    Past Medical History:  Diagnosis Date   Anemia    Anxiety    pt not aware of this   Arthritis    CHF (congestive heart failure) ([place])    hosp. 2011   Chronic kidney disease    not on dialysis at this time   Dilated cardiomyopathy ([place])    Encounter for blood transfusion    Family history of adverse reaction to anesthesia     " Aunt has a tracheostomy and kept coughing after colonoscopy "   Foot fracture    Gout    Heart murmur    as a child   History of blood transfusion    History of pneumonia    Hyperlipidemia    Hypertension    Menorrhagia    Morbid obesity ([place])    Pneumonia    hosp. 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   Leukocytosis, unspecified 01/28/2013   Anemia 01/27/2013   CKD (chronic kidney disease) stage 5, GFR less than 15 ml/min ([place]) 01/27/2013   Syncope 01/27/2013   Menorrhagia 01/27/2013   HYPERLIPIDEMIA-MIXED 04/20/2008   OBESITY-MORBID (>100') 04/20/2008   MITRAL REGURGITATION 04/20/2008   HYPERTENSION, UNSPECIFIED 04/20/2008   CARDIOMYOPATHY, DILATED 41/28/7867   SYSTOLIC HEART FAILURE, ACUTE 04/20/2008   CHRONIC KIDNEY DISEASE STAGE III (MODERATE) 04/20/2008    Past Surgical History:  Procedure Laterality Date   ANGIOPLASTY  07/28/2015   Procedure: ANGIOPLASTY OF RIGHT BRACHIAL VEIN;  Surgeon: Conrad [place], MD;  Location: [facility] OR;  Service: Vascular;;   AV FISTULA PLACEMENT  08/04/2011   Procedure: ARTERIOVENOUS (AV) FISTULA CREATION;  Surgeon: Elam Dutch, MD;  Location[facility] OR;  Service: Vascular;  Laterality: Right;  Creation right brachiocephalic arteriovenous fistula   AV FISTULA PLACEMENT Left 02/05/2013   Procedure: ARTERIOVENOUS FISTULA CREATION LEFT ARM;  Surgeon: Conrad Loma, MD;  Location: [place];  Service: Vascular;  Laterality: Left;   AV FISTULA PLACEMENT Right 06/04/2013   Procedure: ARTERIOVENOUS (  AV) FISTULA CREATION;  Surgeon: Conrad Benson, MD;  Location: [place];  Service: Vascular;  Laterality: Right;   AV FISTULA PLACEMENT Right 04/15/2014   Procedure: INSERTION OF ARTERIOVENOUS (AV) GORE-TEX GRAFT ARM;  Surgeon: Conrad S[place], MD;  Location: [place];  Service: Vascular;  Laterality: Right;   [place] Right 08/13/2013   Procedure: Second Stage Brachial Vein Transposition;  Surgeon: Conrad Tyro, MD;  Location: [place];  Service: Vascular;  Laterality: Right;   CARDIAC SURGERY  1979   repair of hole in heart   INCISION AND  DRAINAGE OF WOUND Right 07/28/2015   Procedure: IRRIGATION AND DEBRIDEMENT RIGHT HAND  WOUND;  Surgeon: Dayna Barker, MD;  Location: [place];  Service: Plastics;  Laterality: Right;   LIGATION OF ARTERIOVENOUS  FISTULA Left 03/05/2013   Procedure: LIGATION OF ARTERIOVENOUS  FISTULA;  Surgeon: Conrad [place], MD;  Location: [place];  Service: Vascular;  Laterality: Left;   PATCH ANGIOPLASTY Left 03/05/2013   Procedure: PATCH ANGIOPLASTY;  Surgeon: Conrad P[place], MD;  Location: [place];  Service: Vascular;  Laterality: Left;   PERIPHERAL VASCULAR CATHETERIZATION Right 10/31/2014   Procedure: Fistulagram;  Surgeon: Conrad [place], MD;  Location: [place] [facility];  Service: Cardiovascular;  Laterality: Right;   PERIPHERAL VASCULAR CATHETERIZATION Right 10/31/2014   Procedure: Peripheral Vascular Balloon Angioplasty;  Surgeon: Conrad Butler, MD;  Location: [place] [facility];  Service: Cardiovascular;  Laterality: Right;  pta venous rt arm   PERIPHERAL VASCULAR CATHETERIZATION N/A 03/13/2015   Procedure: A/V Shuntogram;  Surgeon: Conrad [place], MD;  Location: [place] [facility];  Service: Cardiovascular;  Laterality: N/A;   PERIPHERAL VASCULAR CATHETERIZATION Right 03/13/2015   Procedure: Peripheral Vascular Balloon Angioplasty;  Surgeon: Conrad Davie, MD;  Location: [place] [facility];  Service: Cardiovascular;  Laterality: Right;  Shunt   PERIPHERAL VASCULAR CATHETERIZATION Right 01/19/2016   Procedure: A/V Shuntogram;  Surgeon: Angelia Mould, MD;  Location: [place] [facility];  Service: Cardiovascular;  Laterality: Right;   REVISION OF ARTERIOVENOUS GORETEX GRAFT Right 10/27/2015   Procedure: THROMBECTOMY AND REVISION OF RIGHT UPPER ARM  ARTERIOVENOUS GORETEX GRAFT USING 6MM X 10CM GORE-TEX GRAFT;  Surgeon: Elam Dutch, MD;  Location: [place];  Service: Vascular;  Laterality: Right;   REVISION OF ARTERIOVENOUS GORETEX GRAFT Right 01/30/2016   Procedure: REVISION OF RIGHT UPPER ARM ARTERIOVENOUS USING  GORETEX GRAFT;  Surgeon: Angelia Mould, MD;  Location: [place];  Service: Vascular;  Laterality: Right;   THROMBECTOMY W/ EMBOLECTOMY Left 03/05/2013   Procedure: THROMBECTOMY OF LEFT BRACHIAL ARTERY;  Surgeon: Conrad Meeker, MD;  Location: [place];  Service: Vascular;  Laterality: Left;   VENOGRAM Bilateral 03/25/2014   Procedure: VENOGRAM bilateral;  Surgeon: Conrad , MD;  Location: [facility] CATH LAB;  Service: Cardiovascular;  Laterality: Bilateral;     OB History   No obstetric history on file.      Home Medications    Prior to Admission medications   Medication Sig Start Date End Date Taking? Authorizing Provider  acetaminophen (TYLENOL) 500 MG tablet Take 1,000 mg by mouth every 6 (six) hours as needed (for pain.).    [provider]  allopurinol (ZYLOPRIM) 100 MG tablet Take 200 mg by mouth daily.     [provider]  calcitRIOL (ROCALTROL) 0.5 MCG capsule Take 0.5 mcg by mouth daily.     [provider]  carvedilol (COREG) 25 MG tablet Take  25 mg by mouth 2 (two) times daily.     [provider]  doxycycline (VIBRAMYCIN) 100 MG capsule Take 1 capsule (100 mg total) by mouth 2 (two) times daily. 05/11/16   Junius Creamer, NP  famotidine (PEPCID) 20 MG tablet Take 1 tablet (20 mg total) by mouth 2 (two) times daily. 05/11/16   Junius Creamer, NP  ferrous sulfate 325 (65 FE) MG EC tablet Take 2 tablets (650 mg total) by mouth daily with breakfast. 01/30/16   Alvia Grove, PA-C  furosemide (LASIX) 80 MG tablet Take 80 mg by mouth 2 (two) times daily.    [provider]  hydrALAZINE (APRESOLINE) 100 MG tablet Take 100 mg by mouth 3 (three) times daily.  05/30/13   [provider]  megestrol (MEGACE) 20 MG tablet Take 2 tablets (40 mg total) by mouth daily. 02/28/18   Leftwich-Kirby, Kathie Dike, CNM  nystatin (MYCOSTATIN/NYSTOP) powder Apply topically 2 (two) times daily. 11/01/17   Leftwich-Kirby, Kathie Dike, CNM  nystatin-triamcinolone ointment  (MYCOLOG) Apply 1 application topically 2 (two) times daily. 11/02/17   Leftwich-Kirby, Kathie Dike, CNM  oxyCODONE-acetaminophen (PERCOCET/ROXICET) 5-325 MG tablet Take 1 tablet by mouth every 6 (six) hours as needed. Patient not taking: Reported on 11/01/2017 01/30/16   Virgina Jock A, PA-C  pravastatin (PRAVACHOL) 40 MG tablet Take 40 mg by mouth daily.  06/01/13   [provider]  PROAIR HFA 108 (90 Base) MCG/ACT inhaler Inhale 1-2 puffs into the lungs 3 (three) times daily as needed for wheezing or shortness of breath. For wheezing. 09/04/15   [provider]  RENVELA 800 MG tablet Take 1,600 mg by mouth 3 (three) times daily with meals. And snacks 09/04/13   [provider]    Family History Family History  Problem Relation Age of Onset   Diabetes Mother    Hyperlipidemia Mother    Hypertension Mother    Kidney disease Mother    Anesthesia problems Neg Hx    Hypotension Neg Hx    Malignant hyperthermia Neg Hx    Pseudochol deficiency Neg Hx     Social History Social History   Tobacco Use   Smoking status: Never Smoker   Smokeless tobacco: Never Used  Substance Use Topics   Alcohol use: No    Alcohol/week: 0.0 standard drinks   Drug use: No     Allergies   Patient has no known allergies.   Review of Systems Review of Systems  Constitutional: Negative for fever.  HENT: Positive for congestion.   Respiratory: Positive for cough. Negative for shortness of breath.   All other systems reviewed and are negative.    Physical Exam Updated Vital Signs BP (!) 143/84 (BP Location: Left Wrist)    Pulse 84    Temp 98.5 F (36.9 C) (Oral)    Resp 16    Ht 5\' 9"  (1.753 m)    Wt (!) 143 kg    SpO2 100%    BMI 46.56 kg/m   Physical Exam Vitals signs and nursing note reviewed.  Constitutional:      Appearance: She is well-developed.     Comments: Sitting comfortably on examination table  HENT:     Head: Normocephalic and atraumatic.   Eyes:     General: No scleral icterus.       Right eye: No discharge.        Left eye: No discharge.     Conjunctiva/sclera: Conjunctivae normal.  Pulmonary:  Effort: Pulmonary effort is normal.     Comments: Lungs clear to auscultation bilaterally.  Symmetric chest rise.  No wheezing, rales, rhonchi. Skin:    General: Skin is warm and dry.  Neurological:     Mental Status: She is alert.  Psychiatric:        Speech: Speech normal.        Behavior: Behavior normal.      ED Treatments / Results  Labs (all labs ordered are listed, but only abnormal results are displayed) Labs Reviewed  BASIC METABOLIC PANEL - Abnormal; Notable for the following components:      Result Value   BUN 48 (*)    Creatinine, Ser 11.41 (*)    GFR calc non Af Amer 4 (*)    GFR calc Af Amer 4 (*)    All other components within normal limits  CBC - Abnormal; Notable for the following components:   RBC 3.26 (*)    Hemoglobin 11.4 (*)    HCT 33.9 (*)    MCV 104.0 (*)    MCH 35.0 (*)    All other components within normal limits  SARS CORONAVIRUS 2 (TAT 6-24 HRS)  POC SARS CORONAVIRUS 2 AG -  ED    EKG None  Radiology Dg Chest 2 View  Result Date: 12/04/2018 CLINICAL DATA:  Cough and congestion this morning at dialysis. EXAM: CHEST - 2 VIEW COMPARISON:  Chest x-ray 02/27/2014 FINDINGS: The heart is mildly enlarged. The mediastinal and hilar contours are within normal limits. Chronic scarring type changes in the left mid lung laterally but no infiltrates, edema or effusions. No worrisome pulmonary lesions.  The bony thorax is intact. IMPRESSION: No acute cardiopulmonary findings. Stable cardiac enlargement. Electronically Signed   By: Marijo Sanes M.D.   On: 12/04/2018 10:18    Procedures Procedures (including critical care time)  Medications Ordered in ED Medications - No data to display   Initial Impression / Assessment and Plan / ED Course  I have reviewed the triage vital signs and  the nursing notes.  Pertinent labs & imaging results that were available during my care of the patient were reviewed by me and considered in my medical decision making (see chart for details).        43 year old female past medical story of CKD, CHF who presents for evaluation of concerns for cough, congestion.  Patient is a Monday, Wednesday, Friday dialysis and scheduled to get dialysis today but they wanted her to be evaluated for COVID-19.  Reports that she recently traveled to [place], [place] for Thanksgiving.  No known COVID-19 exposure  Initial ED arrival, she is afebrile, nontoxic-appearing.  Vital signs are stable.  Evidence of respiratory stress.  Concern for viral illness versus COVID-19.  Plan for chest x-ray, BMP to evaluate electrolyte and Covid test.  Chest x-ray shows no acute infectious etiology.  Stable cardiac enlargement.  BMP shows potassium of 4.3.  BUN and creatinine of 48/11.41.  CBC is unremarkable.  Initial rapid Covid test was negative.  We will plan to send out Covid test.  Discussed results with patient.  She is agreeable.  At this time, she does not need emergent dialysis.  She is scheduled to have dialysis appointment today. At this time, patient exhibits no emergent life-threatening condition that require further evaluation in ED or admission. Patient had ample opportunity for questions and discussion. All patient's questions were answered with full understanding. Strict return precautions discussed. Patient expresses understanding and agreement to  plan.   Portions of this note were generated with Dragon dictation software. Dictation errors may occur despite best attempts at proofreading.   Final Clinical Impressions(s) / ED Diagnoses   Final diagnoses:  Cough  Nasal congestion    ED Discharge Orders    None       Volanda Napoleon, PA-C 12/04/18 1357    Maudie Flakes, MD 12/05/18 [phone]

## 2018-12-04 NOTE — ED Triage Notes (Signed)
Pt reports nasal congestion, cough since last night. Pt is dialysis Monday, Wednesday Friday patient. Sent here for COVID test. Denies recent fevers. VSS.

## 2018-12-05 ENCOUNTER — Other Ambulatory Visit: Payer: Self-pay | Admitting: Nurse Practitioner

## 2018-12-05 DIAGNOSIS — U071 COVID-19: Secondary | ICD-10-CM

## 2018-12-05 DIAGNOSIS — I10 Essential (primary) hypertension: Secondary | ICD-10-CM

## 2018-12-05 DIAGNOSIS — N185 Chronic kidney disease, stage 5: Secondary | ICD-10-CM

## 2018-12-05 NOTE — Progress Notes (Signed)
Results reviewed with pt. Discussed self-isolate for 10 days and to be 24 hr s free of fever without fever reducing medication and symptoms are improving. Discussed with pt. To continue to wear a mask, hand washing and social distancing.  Discussed with pt. To inform anyone that she has been around so the can self-quarantine for 14 days and monitor for symptoms.  Pt. Is feeling better, discussed s./s of  Chest pain, difficulty breathing any symptoms that are concerning to return to ED for evaluation and inform of +Covid Status.   All questions answered. Pt. Verbalized understanding.

## 2018-12-05 NOTE — Progress Notes (Signed)
  I connected by phone with [name] on 12/05/2018 at 8:13 AM to discuss the potential use of an new treatment for mild to moderate COVID-19 viral infection in non-hospitalized patients.  This patient is a 43 y.o. female that meets the FDA criteria for Emergency Use Authorization of bamlanivimab:  Has a (+) direct SARS-CoV-2 viral test result  Has mild or moderate COVID-19   Is ? 43 years of age and weighs ? 40 kg  Is NOT hospitalized due to COVID-19  Is NOT requiring oxygen therapy or requiring an increase in baseline oxygen flow rate due to COVID-19  Is within 10 days of symptom onset  Has at least one of the high risk factor(s) for progression to severe COVID-19 and/or hospitalization as defined in EUA.  Specific high risk criteria : Chronic Kidney Disease (CKD), obesity, HTTN  Patient Active Problem List   Diagnosis Date Noted  . Breast mass 09/01/2016  . Anemia in chronic kidney disease (CKD) 07/31/2015  . Itching-Right upper arm 05/17/2014  . Leucocytosis 02/28/2014  . Clotted dialysis access [facility])   . Symptomatic anemia 02/27/2014  . Severe anemia 02/27/2014  . Menorrhagia with irregular cycle 02/27/2014  . Essential hypertension 02/27/2014  . 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 Leukocytosis, unspecified 01/28/2013  . Anemia 01/27/2013  . CKD (chronic kidney disease) stage 5, GFR less than 15 ml/min (HCC) 01/27/2013  . Syncope 01/27/2013  . Menorrhagia 01/27/2013  . HYPERLIPIDEMIA-MIXED 04/20/2008  . OBESITY-MORBID (>100') 04/20/2008  . MITRAL REGURGITATION 04/20/2008  . HYPERTENSION, UNSPECIFIED 04/20/2008  . CARDIOMYOPATHY, DILATED 04/20/2008  . SYSTOLIC HEART FAILURE, ACUTE 04/20/2008  . CHRONIC KIDNEY DISEASE STAGE III (MODERATE) 04/20/2008    I have spoken and communicated the following to the patient or parent/caregiver:  1. FDA has authorized the emergency use of bamlanivimab for the treatment of mild to moderate COVID-19  in adults and pediatric patients with positive results of direct SARS-CoV-2 viral testing who are 35 years of age and older weighing at least 40 kg, and who are at high risk for progressing to severe COVID-19 and/or hospitalization.  2. The significant known and potential risks and benefits of bamlanivimab, and the extent to which such potential risks and benefits are unknown.  3. Information on available alternative treatments and the risks and benefits of those alternatives, including clinical trials.  4. Patients treated with bamlanivimab should continue to self-isolate and use infection control measures (e.g., wear mask, isolate, social distance, avoid sharing personal items, clean and disinfect "high touch" surfaces, and frequent handwashing) according to CDC guidelines.   5. The patient or parent/caregiver has the option to accept or refuse bamlanivimab.  After reviewing this information with the patient, The patient agreed to proceed with receiving the infusion of bamlanivimab and will be provided a copy of the Fact sheet prior to receiving the infusion.  Fenton Foy 12/05/2018 8:13 AM

## 2018-12-06 ENCOUNTER — Ambulatory Visit (HOSPITAL_COMMUNITY)
Admission: RE | Admit: 2018-12-06 | Discharge: 2018-12-06 | Disposition: A | Discharge status: Home / Self Care | Payer: BC Managed Care – PPO | Source: Ambulatory Visit | Attending: Critical Care Medicine | Admitting: Critical Care Medicine

## 2018-12-06 DIAGNOSIS — U071 COVID-19: Secondary | ICD-10-CM | POA: Diagnosis present

## 2018-12-06 DIAGNOSIS — N185 Chronic kidney disease, stage 5: Secondary | ICD-10-CM

## 2018-12-06 DIAGNOSIS — I10 Essential (primary) hypertension: Secondary | ICD-10-CM | POA: Insufficient documentation

## 2018-12-06 MED ORDER — DIPHENHYDRAMINE HCL 50 MG/ML IJ SOLN: 50.0000 mg | Freq: Once | INTRAMUSCULAR | Status: DC | PRN

## 2018-12-06 MED ORDER — SODIUM CHLORIDE 0.9 % IV SOLN
INTRAVENOUS | Status: DC | PRN
  Administered 2018-12-06: 14:00:00 1000 mL via INTRAVENOUS

## 2018-12-06 MED ORDER — FAMOTIDINE IN NACL 20-0.9 MG/50ML-% IV SOLN: 20.0000 mg | Freq: Once | INTRAVENOUS | Status: DC | PRN

## 2018-12-06 MED ORDER — SODIUM CHLORIDE 0.9 % IV SOLN
700.0000 mg | Freq: Once | INTRAVENOUS | Status: AC
  Administered 2018-12-06: 700 mg via INTRAVENOUS
  Filled 2018-12-06: qty 20

## 2018-12-06 MED ORDER — METHYLPREDNISOLONE SODIUM SUCC 125 MG IJ SOLR: 125.0000 mg | Freq: Once | INTRAMUSCULAR | Status: DC | PRN

## 2018-12-06 MED ORDER — EPINEPHRINE 0.3 MG/0.3ML IJ SOAJ: 0.3000 mg | Freq: Once | INTRAMUSCULAR | Status: DC | PRN

## 2018-12-06 MED ORDER — ALBUTEROL SULFATE HFA 108 (90 BASE) MCG/ACT IN AERS: 2.0000 | INHALATION_SPRAY | Freq: Once | RESPIRATORY_TRACT | Status: DC | PRN

## 2018-12-06 NOTE — Progress Notes (Signed)
  Diagnosis: COVID-19  Physician: Glendale Chard, MD  Procedure: bamlanivimab infusion Provided patient with bamlanivimab fact sheet for patients, parents and caregivers prior to infusion.  Complications: No immediate complications noted.  Discharge: Discharged home   [name] 12/06/2018

## 2018-12-06 NOTE — Progress Notes (Signed)
  Diagnosis: COVID-19  Physician:  Procedure: bamlanivimab infusion Provided patient with bamlanivimab fact sheet for patients, parents and caregivers prior to infusion.  Complications: No immediate complications noted.  Discharge: Discharged home   Anguel Delapena N Khaniyah Bezek 12/06/2018

## 2019-08-06 ENCOUNTER — Telehealth (INDEPENDENT_AMBULATORY_CARE_PROVIDER_SITE_OTHER): Payer: BC Managed Care – PPO | Admitting: Family Medicine

## 2019-08-06 DIAGNOSIS — B379 Candidiasis, unspecified: Secondary | ICD-10-CM

## 2019-08-06 NOTE — Telephone Encounter (Signed)
Patient is requesting a call back from the nurse. She wants to know if she could use Monistat until she comes in the office.

## 2019-08-07 MED ORDER — FLUCONAZOLE 150 MG PO TABS
150.0000 mg | ORAL_TABLET | Freq: Once | ORAL | 0 refills | Status: AC
Start: 2019-08-07 — End: 2019-08-07

## 2019-08-07 NOTE — Telephone Encounter (Signed)
Called pt and informed pt that she can use Monistat otc before her visit.  I also advised pt that I would e-prescribe her Diflucan with one refill and if her sx's get better then she can cancel her appt scheduled on 08/09/19.    Pt verbalized understanding.   Mel Almond, RN  08/07/19

## 2019-08-07 NOTE — Addendum Note (Signed)
Addended by: Michel Harrow on: 08/07/2019 01:36 PM   Modules accepted: Orders, Level of Service

## 2019-08-09 ENCOUNTER — Ambulatory Visit: Payer: BC Managed Care – PPO

## 2019-09-17 ENCOUNTER — Ambulatory Visit: Payer: BC Managed Care – PPO | Admitting: Student

## 2019-09-18 ENCOUNTER — Other Ambulatory Visit: Payer: Self-pay

## 2019-09-18 ENCOUNTER — Encounter: Payer: Self-pay | Admitting: Certified Nurse Midwife

## 2019-09-18 ENCOUNTER — Ambulatory Visit (INDEPENDENT_AMBULATORY_CARE_PROVIDER_SITE_OTHER): Payer: BC Managed Care – PPO | Admitting: Certified Nurse Midwife

## 2019-09-18 ENCOUNTER — Other Ambulatory Visit (HOSPITAL_COMMUNITY)
Admission: RE | Admit: 2019-09-18 | Discharge: 2019-09-18 | Disposition: A | Discharge status: Home / Self Care | Payer: BC Managed Care – PPO | Source: Ambulatory Visit | Attending: Student | Admitting: Student

## 2019-09-18 VITALS — BP 124/89 | HR 91 | Ht 69.5 in | Wt 325.0 lb

## 2019-09-18 DIAGNOSIS — N921 Excessive and frequent menstruation with irregular cycle: Secondary | ICD-10-CM

## 2019-09-18 DIAGNOSIS — Z113 Encounter for screening for infections with a predominantly sexual mode of transmission: Secondary | ICD-10-CM

## 2019-09-18 DIAGNOSIS — Z01419 Encounter for gynecological examination (general) (routine) without abnormal findings: Secondary | ICD-10-CM | POA: Diagnosis not present

## 2019-09-18 MED ORDER — MEGESTROL ACETATE 20 MG PO TABS: 40.0000 mg | ORAL_TABLET | Freq: Every day | ORAL | 11 refills | Status: DC

## 2019-09-18 NOTE — Progress Notes (Signed)
GYNECOLOGY ANNUAL PREVENTATIVE CARE ENCOUNTER NOTE  History:     DAY GREB is a 44 y.o. No obstetric history on file. female here for a routine annual gynecologic exam.  Current complaints: none.   Denies abnormal discharge, pelvic pain, problems with intercourse or other gynecologic concerns. patient request STD screening today.    Gynecologic History No LMP recorded. Patient is perimenopausal. Contraception: none Last Pap: 11/01/2017. Results were: normal with negative HPV Last mammogram: 10/20/2018. Results were: normal  Obstetric History OB History  No obstetric history on file.    Past Medical History:  Diagnosis Date  . Anemia   . Anxiety    pt not aware of this  . Arthritis   . CHF (congestive heart failure) ([place])    hosp. 2011  . Chronic kidney disease    not on dialysis at this time  . Dilated cardiomyopathy ([place])   . Encounter for blood transfusion   . Family history of adverse reaction to anesthesia     " Aunt has a tracheostomy and kept coughing after colonoscopy "  . Foot fracture   . Gout   . Heart murmur    as a child  . History of blood transfusion   . History of pneumonia   . Hyperlipidemia   . Hypertension   . Menorrhagia   . Morbid obesity ([place])   . Pneumonia    hosp. 2010  . Shortness of breath    seldom  . Thyroid disease     Past Surgical History:  Procedure Laterality Date  . ANGIOPLASTY  07/28/2015   Procedure: ANGIOPLASTY OF RIGHT BRACHIAL VEIN;  Surgeon: Conrad Oakwood, MD;  Location: [place];  Service: Vascular;;  . AV FISTULA PLACEMENT  08/04/2011   Procedure: ARTERIOVENOUS (AV) FISTULA CREATION;  Surgeon: Elam Dutch, MD;  Location: [facility] OR;  Service: Vascular;  Laterality: Right;  Creation right brachiocephalic arteriovenous fistula  . AV FISTULA PLACEMENT Left 02/05/2013   Procedure: ARTERIOVENOUS FISTULA CREATION LEFT ARM;  Surgeon: Conrad Annetta, MD;  Location: [place];  Service: Vascular;  Laterality: Left;  . AV FISTULA  PLACEMENT Right 06/04/2013   Procedure: ARTERIOVENOUS (AV) FISTULA CREATION;  Surgeon: Conrad Angier, MD;  Location: [place];  Service: Vascular;  Laterality: Right;  . AV FISTULA PLACEMENT Right 04/15/2014   Procedure: INSERTION OF ARTERIOVENOUS (AV) GORE-TEX GRAFT ARM;  Surgeon: Conrad Southern Ute, MD;  Location: [place];  Service: Vascular;  Laterality: Right;  . BASCILIC VEIN TRANSPOSITION Right 08/13/2013   Procedure: Second Stage Brachial Vein Transposition;  Surgeon: Conrad Upsala, MD;  Location: [place];  Service: Vascular;  Laterality: Right;  . CARDIAC SURGERY  1979   repair of hole in heart  . INCISION AND DRAINAGE OF WOUND Right 07/28/2015   Procedure: IRRIGATION AND DEBRIDEMENT RIGHT HAND  WOUND;  Surgeon: Dayna Barker, MD;  Location: [place];  Service: Plastics;  Laterality: Right;  . LIGATION OF ARTERIOVENOUS  FISTULA Left 03/05/2013   Procedure: LIGATION OF ARTERIOVENOUS  FISTULA;  Surgeon: Conrad Coolville, MD;  Location: [place];  Service: Vascular;  Laterality: Left;  . PATCH ANGIOPLASTY Left 03/05/2013   Procedure: PATCH ANGIOPLASTY;  Surgeon: Conrad [place] Creek, MD;  Location: [place];  Service: Vascular;  Laterality: Left;  . PERIPHERAL VASCULAR CATHETERIZATION Right 10/31/2014   Procedure: Fistulagram;  Surgeon: Conrad Gantt, MD;  Location: [place] CV LAB;  Service: Cardiovascular;  Laterality: Right;  . PERIPHERAL VASCULAR CATHETERIZATION Right 10/31/2014  Procedure: Peripheral Vascular Balloon Angioplasty;  Surgeon: Conrad Osprey, MD;  Location: [place] CV LAB;  Service: Cardiovascular;  Laterality: Right;  pta venous rt arm  . PERIPHERAL VASCULAR CATHETERIZATION N/A 03/13/2015   Procedure: A/V Shuntogram;  Surgeon: Conrad Prosperity, MD;  Location: [place] [facility];  Service: Cardiovascular;  Laterality: N/A;  . PERIPHERAL VASCULAR CATHETERIZATION Right 03/13/2015   Procedure: Peripheral Vascular Balloon Angioplasty;  Surgeon: Conrad Mason, MD;  Location: [place] CV LAB;  Service: Cardiovascular;   Laterality: Right;  Shunt  . PERIPHERAL VASCULAR CATHETERIZATION Right 01/19/2016   Procedure: A/V Shuntogram;  Surgeon: Angelia Mould, MD;  Location: [place] [facility];  Service: Cardiovascular;  Laterality: Right;  . REVISION OF ARTERIOVENOUS GORETEX GRAFT Right 10/27/2015   Procedure: THROMBECTOMY AND REVISION OF RIGHT UPPER ARM  ARTERIOVENOUS GORETEX GRAFT USING 6MM X 10CM GORE-TEX GRAFT;  Surgeon: Elam Dutch, MD;  Location: [place];  Service: Vascular;  Laterality: Right;  . REVISION OF ARTERIOVENOUS GORETEX GRAFT Right 01/30/2016   Procedure: REVISION OF RIGHT UPPER ARM ARTERIOVENOUS USING GORETEX GRAFT;  Surgeon: Angelia Mould, MD;  Location: [place];  Service: Vascular;  Laterality: Right;  . THROMBECTOMY W/ EMBOLECTOMY Left 03/05/2013   Procedure: THROMBECTOMY OF LEFT BRACHIAL ARTERY;  Surgeon: Conrad [place], MD;  Location: [place];  Service: Vascular;  Laterality: Left;  Marland Kitchen VENOGRAM Bilateral 03/25/2014   Procedure: VENOGRAM bilateral;  Surgeon: Conrad Carey, MD;  Location: [facility] CATH LAB;  Service: Cardiovascular;  Laterality: Bilateral;    Current Outpatient Medications on File Prior to Visit  Medication Sig Dispense Refill  . acetaminophen (TYLENOL) 500 MG tablet Take 1,000 mg by mouth every 6 (six) hours as needed (for pain.).    Marland Kitchen allopurinol (ZYLOPRIM) 100 MG tablet Take 200 mg by mouth daily.     . calcitRIOL (ROCALTROL) 0.5 MCG capsule Take 0.5 mcg by mouth daily.     . carvedilol (COREG) 25 MG tablet Take 25 mg by mouth 2 (two) times daily.     Marland Kitchen doxycycline (VIBRAMYCIN) 100 MG capsule Take 1 capsule (100 mg total) by mouth 2 (two) times daily. 20 capsule 0  . famotidine (PEPCID) 20 MG tablet Take 1 tablet (20 mg total) by mouth 2 (two) times daily. 30 tablet 0  . ferrous sulfate 325 (65 FE) MG EC tablet Take 2 tablets (650 mg total) by mouth daily with breakfast.    . furosemide (LASIX) 80 MG tablet Take 80 mg by mouth 2 (two) times daily.    . hydrALAZINE  (APRESOLINE) 100 MG tablet Take 100 mg by mouth 3 (three) times daily.     Marland Kitchen nystatin (MYCOSTATIN/NYSTOP) powder Apply topically 2 (two) times daily. 15 g 2  . nystatin-triamcinolone ointment (MYCOLOG) Apply 1 application topically 2 (two) times daily. 30 g 0  . oxyCODONE-acetaminophen (PERCOCET/ROXICET) 5-325 MG tablet Take 1 tablet by mouth every 6 (six) hours as needed. (Patient not taking: Reported on 11/01/2017) 6 tablet 0  . pravastatin (PRAVACHOL) 40 MG tablet Take 40 mg by mouth daily.     Marland Kitchen PROAIR HFA 108 (90 Base) MCG/ACT inhaler Inhale 1-2 puffs into the lungs 3 (three) times daily as needed for wheezing or shortness of breath. For wheezing.  2  . RENVELA 800 MG tablet Take 1,600 mg by mouth 3 (three) times daily with meals. And snacks     No current facility-administered medications on file prior to visit.    No Known Allergies  Social History:  reports that she has never smoked. She has never used smokeless tobacco. She reports that she does not drink alcohol and does not use drugs.  Family History  Problem Relation Age of Onset  . Diabetes Mother   . Hyperlipidemia Mother   . Hypertension Mother   . Kidney disease Mother   . Anesthesia problems Neg Hx   . Hypotension Neg Hx   . Malignant hyperthermia Neg Hx   . Pseudochol deficiency Neg Hx     The following portions of the patient's history were reviewed and updated as appropriate: allergies, current medications, past family history, past medical history, past social history, past surgical history and problem list.  Review of Systems Pertinent items noted in HPI and remainder of comprehensive ROS otherwise negative.  Physical Exam:  BP 124/89   Pulse 91   Ht 5' 9.5" (1.765 m)   Wt (!) 325 lb (147.4 kg)   BMI 47.31 kg/m  CONSTITUTIONAL: Well-developed, well-nourished female in no acute distress.  HENT:  Normocephalic, atraumatic, External right and left ear normal. Oropharynx is clear and moist EYES: Conjunctivae  and EOM are normal. Pupils are equal, round, and reactive to light. NECK: Normal range of motion, supple, no masses.  Normal thyroid.  SKIN: Skin is warm and dry. No rash noted. Not diaphoretic. No erythema. No pallor. MUSCULOSKELETAL: Normal range of motion. No tenderness.  No cyanosis, clubbing, or edema.  2+ distal pulses. NEUROLOGIC: Alert and oriented to person, place, and time. Normal reflexes, muscle tone coordination.  PSYCHIATRIC: Normal mood and affect. Normal behavior. Normal judgment and thought content. CARDIOVASCULAR: Normal heart rate noted, regular rhythm RESPIRATORY: Clear to auscultation bilaterally. Effort and breath sounds normal, no problems with respiration noted. BREASTS: Symmetric in size. No masses, tenderness, skin changes, nipple drainage, or lymphadenopathy bilaterally. Performed in the presence of a chaperone. ABDOMEN: Soft, no distention noted.  No tenderness, rebound or guarding.  PELVIC: Normal appearing external genitalia and urethral meatus;. Normal uterine size, no other palpable masses, no uterine or adnexal tenderness.  Performed in the presence of a chaperone.   Assessment and Plan:    1. Screen for STD (sexually transmitted disease) - patient request screening for STDs - HIV Antibody (routine testing w rflx) - RPR - Hepatitis B Surface AntiGEN - Hepatitis C Antibody - Cervicovaginal ancillary only( [place])  2. Women's annual routine gynecological examination - normal well woman examination  - pap smear up to date   3. Menorrhagia with irregular cycle - patient request refill of medication  - megestrol (MEGACE) 20 MG tablet; Take 2 tablets (40 mg total) by mouth daily.  Dispense: 60 tablet; Refill: 11   Patient received letter to scheduled Mammogram and plans to call for scheduling  Routine preventative health maintenance measures emphasized. Please refer to After Visit Summary for other counseling recommendations.      Lajean Manes,  [place] for [employer], [place]

## 2019-09-19 LAB — HEPATITIS B SURFACE ANTIGEN: Hepatitis B Surface Ag: NEGATIVE

## 2019-09-19 LAB — RPR: RPR Ser Ql: NONREACTIVE

## 2019-09-19 LAB — HIV ANTIBODY (ROUTINE TESTING W REFLEX): HIV Screen 4th Generation wRfx: NONREACTIVE

## 2019-09-19 LAB — HEPATITIS C ANTIBODY: Hep C Virus Ab: <0.1 s/co ratio (ref 0.0–0.9)

## 2019-09-20 LAB — CERVICOVAGINAL ANCILLARY ONLY
Bacterial Vaginitis (gardnerella): NEGATIVE
Candida Glabrata: NEGATIVE
Candida Vaginitis: NEGATIVE
Chlamydia: NEGATIVE
Comment: NEGATIVE
Comment: NEGATIVE
Comment: NEGATIVE
Comment: NEGATIVE
Comment: NEGATIVE
Comment: NORMAL
Neisseria Gonorrhea: NEGATIVE
Trichomonas: NEGATIVE

## 2019-10-03 ENCOUNTER — Other Ambulatory Visit: Payer: Self-pay | Admitting: Internal Medicine

## 2019-10-03 DIAGNOSIS — Z1231 Encounter for screening mammogram for malignant neoplasm of breast: Secondary | ICD-10-CM

## 2019-10-23 ENCOUNTER — Ambulatory Visit
Admission: RE | Admit: 2019-10-23 | Discharge: 2019-10-23 | Disposition: A | Discharge status: Home / Self Care | Payer: BC Managed Care – PPO | Source: Ambulatory Visit | Attending: Internal Medicine | Admitting: Internal Medicine

## 2019-10-23 ENCOUNTER — Other Ambulatory Visit: Payer: Self-pay

## 2019-10-23 DIAGNOSIS — Z1231 Encounter for screening mammogram for malignant neoplasm of breast: Secondary | ICD-10-CM

## 2020-04-14 ENCOUNTER — Emergency Department (HOSPITAL_COMMUNITY)
Admission: EM | Admit: 2020-04-14 | Discharge: 2020-04-14 | Disposition: A | Discharge status: Home / Self Care | Payer: BC Managed Care – PPO | Attending: Emergency Medicine | Admitting: Emergency Medicine

## 2020-04-14 ENCOUNTER — Encounter (HOSPITAL_COMMUNITY): Payer: Self-pay | Admitting: Emergency Medicine

## 2020-04-14 ENCOUNTER — Other Ambulatory Visit: Payer: Self-pay

## 2020-04-14 DIAGNOSIS — I5021 Acute systolic (congestive) heart failure: Secondary | ICD-10-CM | POA: Diagnosis not present

## 2020-04-14 DIAGNOSIS — N185 Chronic kidney disease, stage 5: Secondary | ICD-10-CM | POA: Insufficient documentation

## 2020-04-14 DIAGNOSIS — Z23 Encounter for immunization: Secondary | ICD-10-CM | POA: Diagnosis not present

## 2020-04-14 DIAGNOSIS — I132 Hypertensive heart and chronic kidney disease with heart failure and with stage 5 chronic kidney disease, or end stage renal disease: Secondary | ICD-10-CM | POA: Insufficient documentation

## 2020-04-14 DIAGNOSIS — W540XXA Bitten by dog, initial encounter: Secondary | ICD-10-CM | POA: Diagnosis not present

## 2020-04-14 DIAGNOSIS — S81852A Open bite, left lower leg, initial encounter: Secondary | ICD-10-CM | POA: Insufficient documentation

## 2020-04-14 DIAGNOSIS — Z79899 Other long term (current) drug therapy: Secondary | ICD-10-CM | POA: Diagnosis not present

## 2020-04-14 MED ORDER — AMOXICILLIN-POT CLAVULANATE 875-125 MG PO TABS: 1.0000 | ORAL_TABLET | Freq: Two times a day (BID) | ORAL | 0 refills | Status: DC

## 2020-04-14 MED ORDER — TETANUS-DIPHTH-ACELL PERTUSSIS 5-2.5-18.5 LF-MCG/0.5 IM SUSY
0.5000 mL | PREFILLED_SYRINGE | Freq: Once | INTRAMUSCULAR | Status: AC
  Administered 2020-04-14: 0.5 mL via INTRAMUSCULAR
  Filled 2020-04-14: qty 0.5

## 2020-04-14 NOTE — ED Triage Notes (Signed)
Patient has two dogs of hers that she was breaking up. One of the dogs bit her on the back of left calf. Patient states it was two weeks ago. Patient states it is burning and she needs a tetanus shot.

## 2020-04-14 NOTE — Discharge Instructions (Signed)
Take antibiotics as directed.  Please follow up for wound check in the next couple of days.  Please speak to pharmacist about side effects of any new medications prescribed today. Come back to the ER for any new or worsening symptoms.   Get help right away if: You have a red streak going away from your wound. You have any of these coming from your wound: Non-clear fluid. More blood. Pus or a bad smell. You have trouble moving your injured area. You lose feeling (have numbness) or feel tingling anywhere on your body.

## 2020-04-14 NOTE — ED Provider Notes (Signed)
[facility] DEPT Provider Note   CSN: [id number] Arrival date & time: 04/14/20  2001     History Chief Complaint  Patient presents with  . Animal Bite    [name] is a 46 y.o. female with PMH of CHF, CKD on dialysis that presents to the ER for dog bite that occurred two weeks ago. She states that her own two dogs were fighting and she tried to break them up when one of them accidentally bit her L calf. They are update on vaccinations, no concerns for rabies. States that she washed it with peroxide and put antibiotic ointment on it. States that she does have a burning sensation still in that area. Denies any fevers, warmth, redness in that area. No weakness or sensation changes. States that she primarily came here for tetanus shot since she is unsure when her last one is. No other complaints.   HPI     Past Medical History:  Diagnosis Date  . Anemia   . Anxiety    pt not aware of this  . Arthritis   . CHF (congestive heart failure) ([place])    hosp. 2011  . Chronic kidney disease    not on dialysis at this time  . Dilated cardiomyopathy ([place])   . Encounter for blood transfusion   . Family history of adverse reaction to anesthesia     " Aunt has a tracheostomy and kept coughing after colonoscopy "  . Foot fracture   . Gout   . Heart murmur    as a child  . History of blood transfusion   . History of pneumonia   . Hyperlipidemia   . Hypertension   . Menorrhagia   . Morbid obesity ([place])   . Pneumonia    hosp. 2010  . Shortness of breath    seldom  . Thyroid disease     Patient Active Problem List   Diagnosis Date Noted  . Breast mass 09/01/2016  . Anemia in chronic kidney disease (CKD) 07/31/2015  . Itching-Right upper arm 05/17/2014  . Leucocytosis 02/28/2014  . Clotted dialysis access [facility])   . Symptomatic anemia 02/27/2014  . Severe anemia 02/27/2014  . Menorrhagia with irregular cycle 02/27/2014  . Essential hypertension  02/27/2014  . Thromboembolism of upper extremity artery ([place]) 03/30/2013  . Leukocytosis, unspecified 01/28/2013  . Anemia 01/27/2013  . CKD (chronic kidney disease) stage 5, GFR less than 15 ml/min (HCC) 01/27/2013  . Syncope 01/27/2013  . Menorrhagia 01/27/2013  . HYPERLIPIDEMIA-MIXED 04/20/2008  . OBESITY-MORBID (>100') 04/20/2008  . MITRAL REGURGITATION 04/20/2008  . HYPERTENSION, UNSPECIFIED 04/20/2008  . CARDIOMYOPATHY, DILATED 04/20/2008  . SYSTOLIC HEART FAILURE, ACUTE 04/20/2008  . CHRONIC KIDNEY DISEASE STAGE III (MODERATE) 04/20/2008    Past Surgical History:  Procedure Laterality Date  . ANGIOPLASTY  07/28/2015   Procedure: ANGIOPLASTY OF RIGHT BRACHIAL VEIN;  Surgeon: Conrad Atlanta, MD;  Location: [place];  Service: Vascular;;  . AV FISTULA PLACEMENT  08/04/2011   Procedure: ARTERIOVENOUS (AV) FISTULA CREATION;  Surgeon: Elam Dutch, MD;  Location: [facility] OR;  Service: Vascular;  Laterality: Right;  Creation right brachiocephalic arteriovenous fistula  . AV FISTULA PLACEMENT Left 02/05/2013   Procedure: ARTERIOVENOUS FISTULA CREATION LEFT ARM;  Surgeon: Conrad Long Creek, MD;  Location: [place];  Service: Vascular;  Laterality: Left;  . AV FISTULA PLACEMENT Right 06/04/2013   Procedure: ARTERIOVENOUS (AV) FISTULA CREATION;  Surgeon: Conrad , MD;  Location: [place];  Service:  Vascular;  Laterality: Right;  . AV FISTULA PLACEMENT Right 04/15/2014   Procedure: INSERTION OF ARTERIOVENOUS (AV) GORE-TEX GRAFT ARM;  Surgeon: Conrad Grover, MD;  Location: [place];  Service: Vascular;  Laterality: Right;  . BASCILIC VEIN TRANSPOSITION Right 08/13/2013   Procedure: Second Stage Brachial Vein Transposition;  Surgeon: Conrad [place] Hills, MD;  Location: [place];  Service: Vascular;  Laterality: Right;  . CARDIAC SURGERY  1979   repair of hole in heart  . INCISION AND DRAINAGE OF WOUND Right 07/28/2015   Procedure: IRRIGATION AND DEBRIDEMENT RIGHT HAND  WOUND;  Surgeon: Dayna Barker, MD;  Location: [place];   Service: Plastics;  Laterality: Right;  . LIGATION OF ARTERIOVENOUS  FISTULA Left 03/05/2013   Procedure: LIGATION OF ARTERIOVENOUS  FISTULA;  Surgeon: Conrad Ethridge, MD;  Location: [place];  Service: Vascular;  Laterality: Left;  . PATCH ANGIOPLASTY Left 03/05/2013   Procedure: PATCH ANGIOPLASTY;  Surgeon: Conrad [place], MD;  Location: [place];  Service: Vascular;  Laterality: Left;  . PERIPHERAL VASCULAR CATHETERIZATION Right 10/31/2014   Procedure: Fistulagram;  Surgeon: Conrad Camp, MD;  Location: [place] CV LAB;  Service: Cardiovascular;  Laterality: Right;  . PERIPHERAL VASCULAR CATHETERIZATION Right 10/31/2014   Procedure: Peripheral Vascular Balloon Angioplasty;  Surgeon: Conrad Longport, MD;  Location: [place] CV LAB;  Service: Cardiovascular;  Laterality: Right;  pta venous rt arm  . PERIPHERAL VASCULAR CATHETERIZATION N/A 03/13/2015   Procedure: A/V Shuntogram;  Surgeon: Conrad Sterling, MD;  Location: [place] CV LAB;  Service: Cardiovascular;  Laterality: N/A;  . PERIPHERAL VASCULAR CATHETERIZATION Right 03/13/2015   Procedure: Peripheral Vascular Balloon Angioplasty;  Surgeon: Conrad Antelope, MD;  Location: [place] CV LAB;  Service: Cardiovascular;  Laterality: Right;  Shunt  . PERIPHERAL VASCULAR CATHETERIZATION Right 01/19/2016   Procedure: A/V Shuntogram;  Surgeon: Angelia Mould, MD;  Location: [place] CV LAB;  Service: Cardiovascular;  Laterality: Right;  . REVISION OF ARTERIOVENOUS GORETEX GRAFT Right 10/27/2015   Procedure: THROMBECTOMY AND REVISION OF RIGHT UPPER ARM  ARTERIOVENOUS GORETEX GRAFT USING 6MM X 10CM GORE-TEX GRAFT;  Surgeon: Elam Dutch, MD;  Location: [place];  Service: Vascular;  Laterality: Right;  . REVISION OF ARTERIOVENOUS GORETEX GRAFT Right 01/30/2016   Procedure: REVISION OF RIGHT UPPER ARM ARTERIOVENOUS USING GORETEX GRAFT;  Surgeon: Angelia Mould, MD;  Location: [place];  Service: Vascular;  Laterality: Right;  . THROMBECTOMY W/ EMBOLECTOMY  Left 03/05/2013   Procedure: THROMBECTOMY OF LEFT BRACHIAL ARTERY;  Surgeon: Conrad Margate, MD;  Location: [place];  Service: Vascular;  Laterality: Left;  Marland Kitchen VENOGRAM Bilateral 03/25/2014   Procedure: VENOGRAM bilateral;  Surgeon: Conrad Allegheny, MD;  Location: [facility] C[facility] LAB;  Service: Cardiovascular;  Laterality: Bilateral;     OB History   No obstetric history on file.     Family History  Problem Relation Age of Onset  . Diabetes Mother   . Hyperlipidemia Mother   . Hypertension Mother   . Kidney disease Mother   . Anesthesia problems Neg Hx   . Hypotension Neg Hx   . Malignant hyperthermia Neg Hx   . Pseudochol deficiency Neg Hx     Social History   Tobacco Use  . Smoking status: Never Smoker  . Smokeless tobacco: Never Used  Substance Use Topics  . Alcohol use: No    Alcohol/week: 0.0 standard drinks  . Drug use: No    Home Medications Prior to Admission  medications   Medication Sig Start Date End Date Taking? Authorizing Provider  amoxicillin-clavulanate (AUGMENTIN) 875-125 MG tablet Take 1 tablet by mouth every 12 (twelve) hours. 04/14/20  Yes Alfredia Client, PA-C  acetaminophen (TYLENOL) 500 MG tablet Take 1,000 mg by mouth every 6 (six) hours as needed (for pain.).    [provider]  allopurinol (ZYLOPRIM) 100 MG tablet Take 200 mg by mouth daily.     [provider]  calcitRIOL (ROCALTROL) 0.5 MCG capsule Take 0.5 mcg by mouth daily.     [provider]  carvedilol (COREG) 25 MG tablet Take 25 mg by mouth 2 (two) times daily.     [provider]  famotidine (PEPCID) 20 MG tablet Take 1 tablet (20 mg total) by mouth 2 (two) times daily. 05/11/16   Junius Creamer, NP  ferrous sulfate 325 (65 FE) MG EC tablet Take 2 tablets (650 mg total) by mouth daily with breakfast. 01/30/16   Alvia Grove, PA-C  furosemide (LASIX) 80 MG tablet Take 80 mg by mouth 2 (two) times daily.    [provider]  hydrALAZINE (APRESOLINE) 100 MG tablet  Take 100 mg by mouth 3 (three) times daily.  05/30/13   [provider]  megestrol (MEGACE) 20 MG tablet Take 2 tablets (40 mg total) by mouth daily. 09/18/19   Lajean Manes, CNM  nystatin (MYCOSTATIN/NYSTOP) powder Apply topically 2 (two) times daily. 11/01/17   Leftwich-Kirby, Kathie Dike, CNM  nystatin-triamcinolone ointment (MYCOLOG) Apply 1 application topically 2 (two) times daily. 11/02/17   [place]wich-Kirby, Kathie Dike, CNM  oxyCODONE-acetaminophen (PERCOCET/ROXICET) 5-325 MG tablet Take 1 tablet by mouth every 6 (six) hours as needed. Patient not taking: Reported on 11/01/2017 01/30/16   Virgina Jock A, PA-C  pravastatin (PRAVACHOL) 40 MG tablet Take 40 mg by mouth daily.  06/01/13   [provider]  PROAIR HFA 108 (90 Base) MCG/ACT inhaler Inhale 1-2 puffs into the lungs 3 (three) times daily as needed for wheezing or shortness of breath. For wheezing. 09/04/15   [provider]  RENVELA 800 MG tablet Take 1,600 mg by mouth 3 (three) times daily with meals. And snacks 09/04/13   [provider]    Allergies    Pollen extract and Oxycodone-acetaminophen  Review of Systems   Review of Systems  Constitutional: Negative for diaphoresis, fatigue and fever.  Eyes: Negative for visual disturbance.  Respiratory: Negative for shortness of breath.   Cardiovascular: Negative for chest pain.  Gastrointestinal: Negative for nausea and vomiting.  Musculoskeletal: Positive for arthralgias. Negative for back pain and myalgias.  Skin: Positive for wound. Negative for color change, pallor and rash.  Neurological: Negative for syncope, weakness, light-headedness, numbness and headaches.  Psychiatric/Behavioral: Negative for behavioral problems and confusion.    Physical Exam Updated Vital Signs BP (!) 143/95   Pulse 75   Temp 98.6 F (37 C) (Oral)   Resp 18   Ht 5' 9.5" (1.765 m)   Wt (!) 140 kg   SpO2 98%   BMI 44.93 kg/m   Physical Exam Constitutional:       General: She is not in acute distress.    Appearance: Normal appearance. She is not ill-appearing, toxic-appearing or diaphoretic.  Cardiovascular:     Rate and Rhythm: Normal rate and regular rhythm.     Pulses: Normal pulses.  Pulmonary:     Effort: Pulmonary effort is normal.     Breath sounds: Normal breath sounds.  Musculoskeletal:  General: Normal range of motion.  Skin:    General: Skin is warm and dry.     Capillary Refill: Capillary refill takes less than 2 seconds.          Comments: Pt with two dime shaped lesions to posterior L calf. Are completely scabbed over. No TTP to the are. No warmth or erythema. Area is healing. Normal strength and sensation to knee, ankle. PT 2+. Normal gait.   Neurological:     General: No focal deficit present.     Mental Status: She is alert and oriented to person, place, and time.  Psychiatric:        Mood and Affect: Mood normal.        Behavior: Behavior normal.        Thought Content: Thought content normal.     ED Results / Procedures / Treatments   Labs (all labs ordered are listed, but only abnormal results are displayed) Labs Reviewed - No data to display  EKG None  Radiology No results found.  Procedures Procedures   Medications Ordered in ED Medications  Tdap (BOOSTRIX) injection 0.5 mL (0.5 mLs Intramuscular Given 04/14/20 2128)    ED Course  I have reviewed the triage vital signs and the nursing notes.  Pertinent labs & imaging results that were available during my care of the patient were reviewed by me and considered in my medical decision making (see chart for details).    MDM Rules/Calculators/A&P                         Pt presents with dog bite, two weeks ago. Dogs are hers, were vaccinated, no concerns for rabies. Area is healing well, no signs of infection, however with pt being dialysis pt I think prophylactic antibitoics will not hurt. She is distally neurovascualry intact. Pt will follow up  with PCP to make sure wound is healing. She is here for tetanus shot, was updated today. Symptmatic treatment discussed. Pt to be discharged. Strict return precautions given.    Doubt need for further emergent work up at this time. I explained the diagnosis and have given explicit precautions to return to the ER including for any other new or worsening symptoms. The patient understands and accepts the medical plan as it's been dictated and I have answered their questions. Discharge instructions concerning home care and prescriptions have been given. The patient is STABLE and is discharged to home in good condition.  Final Clinical Impression(s) / ED Diagnoses Final diagnoses:  Dog bite, initial encounter    Rx / DC Orders ED Discharge Orders         Ordered    amoxicillin-clavulanate (AUGMENTIN) 875-125 MG tablet  Every 12 hours        04/14/20 2057           Alfredia Client, PA-C 04/14/20 2224    Quintella Reichert, MD 04/15/20 1451

## 2020-09-05 DIAGNOSIS — N2581 Secondary hyperparathyroidism of renal origin: Secondary | ICD-10-CM | POA: Diagnosis not present

## 2020-09-05 DIAGNOSIS — D689 Coagulation defect, unspecified: Secondary | ICD-10-CM | POA: Diagnosis not present

## 2020-09-05 DIAGNOSIS — Z992 Dependence on renal dialysis: Secondary | ICD-10-CM | POA: Diagnosis not present

## 2020-09-05 DIAGNOSIS — N186 End stage renal disease: Secondary | ICD-10-CM | POA: Diagnosis not present

## 2020-09-05 DIAGNOSIS — L299 Pruritus, unspecified: Secondary | ICD-10-CM | POA: Diagnosis not present

## 2020-09-05 DIAGNOSIS — E876 Hypokalemia: Secondary | ICD-10-CM | POA: Diagnosis not present

## 2020-09-05 DIAGNOSIS — D631 Anemia in chronic kidney disease: Secondary | ICD-10-CM | POA: Diagnosis not present

## 2020-09-08 DIAGNOSIS — D689 Coagulation defect, unspecified: Secondary | ICD-10-CM | POA: Diagnosis not present

## 2020-09-08 DIAGNOSIS — N186 End stage renal disease: Secondary | ICD-10-CM | POA: Diagnosis not present

## 2020-09-08 DIAGNOSIS — D631 Anemia in chronic kidney disease: Secondary | ICD-10-CM | POA: Diagnosis not present

## 2020-09-08 DIAGNOSIS — N2581 Secondary hyperparathyroidism of renal origin: Secondary | ICD-10-CM | POA: Diagnosis not present

## 2020-09-08 DIAGNOSIS — E876 Hypokalemia: Secondary | ICD-10-CM | POA: Diagnosis not present

## 2020-09-08 DIAGNOSIS — L299 Pruritus, unspecified: Secondary | ICD-10-CM | POA: Diagnosis not present

## 2020-09-08 DIAGNOSIS — Z992 Dependence on renal dialysis: Secondary | ICD-10-CM | POA: Diagnosis not present

## 2020-09-10 DIAGNOSIS — D689 Coagulation defect, unspecified: Secondary | ICD-10-CM | POA: Diagnosis not present

## 2020-09-10 DIAGNOSIS — L299 Pruritus, unspecified: Secondary | ICD-10-CM | POA: Diagnosis not present

## 2020-09-10 DIAGNOSIS — N2581 Secondary hyperparathyroidism of renal origin: Secondary | ICD-10-CM | POA: Diagnosis not present

## 2020-09-10 DIAGNOSIS — N186 End stage renal disease: Secondary | ICD-10-CM | POA: Diagnosis not present

## 2020-09-10 DIAGNOSIS — D631 Anemia in chronic kidney disease: Secondary | ICD-10-CM | POA: Diagnosis not present

## 2020-09-10 DIAGNOSIS — E876 Hypokalemia: Secondary | ICD-10-CM | POA: Diagnosis not present

## 2020-09-10 DIAGNOSIS — Z992 Dependence on renal dialysis: Secondary | ICD-10-CM | POA: Diagnosis not present

## 2020-09-17 DIAGNOSIS — E876 Hypokalemia: Secondary | ICD-10-CM | POA: Diagnosis not present

## 2020-09-17 DIAGNOSIS — L299 Pruritus, unspecified: Secondary | ICD-10-CM | POA: Diagnosis not present

## 2020-09-17 DIAGNOSIS — Z992 Dependence on renal dialysis: Secondary | ICD-10-CM | POA: Diagnosis not present

## 2020-09-17 DIAGNOSIS — D689 Coagulation defect, unspecified: Secondary | ICD-10-CM | POA: Diagnosis not present

## 2020-09-17 DIAGNOSIS — D631 Anemia in chronic kidney disease: Secondary | ICD-10-CM | POA: Diagnosis not present

## 2020-09-17 DIAGNOSIS — N2581 Secondary hyperparathyroidism of renal origin: Secondary | ICD-10-CM | POA: Diagnosis not present

## 2020-09-17 DIAGNOSIS — N186 End stage renal disease: Secondary | ICD-10-CM | POA: Diagnosis not present

## 2020-09-19 DIAGNOSIS — D689 Coagulation defect, unspecified: Secondary | ICD-10-CM | POA: Diagnosis not present

## 2020-09-19 DIAGNOSIS — L299 Pruritus, unspecified: Secondary | ICD-10-CM | POA: Diagnosis not present

## 2020-09-19 DIAGNOSIS — D631 Anemia in chronic kidney disease: Secondary | ICD-10-CM | POA: Diagnosis not present

## 2020-09-19 DIAGNOSIS — N2581 Secondary hyperparathyroidism of renal origin: Secondary | ICD-10-CM | POA: Diagnosis not present

## 2020-09-19 DIAGNOSIS — Z992 Dependence on renal dialysis: Secondary | ICD-10-CM | POA: Diagnosis not present

## 2020-09-19 DIAGNOSIS — N186 End stage renal disease: Secondary | ICD-10-CM | POA: Diagnosis not present

## 2020-09-19 DIAGNOSIS — E876 Hypokalemia: Secondary | ICD-10-CM | POA: Diagnosis not present

## 2020-09-22 DIAGNOSIS — D631 Anemia in chronic kidney disease: Secondary | ICD-10-CM | POA: Diagnosis not present

## 2020-09-22 DIAGNOSIS — D689 Coagulation defect, unspecified: Secondary | ICD-10-CM | POA: Diagnosis not present

## 2020-09-22 DIAGNOSIS — E876 Hypokalemia: Secondary | ICD-10-CM | POA: Diagnosis not present

## 2020-09-22 DIAGNOSIS — N2581 Secondary hyperparathyroidism of renal origin: Secondary | ICD-10-CM | POA: Diagnosis not present

## 2020-09-22 DIAGNOSIS — Z992 Dependence on renal dialysis: Secondary | ICD-10-CM | POA: Diagnosis not present

## 2020-09-22 DIAGNOSIS — N186 End stage renal disease: Secondary | ICD-10-CM | POA: Diagnosis not present

## 2020-09-22 DIAGNOSIS — L299 Pruritus, unspecified: Secondary | ICD-10-CM | POA: Diagnosis not present

## 2020-09-25 ENCOUNTER — Encounter (HOSPITAL_COMMUNITY): Payer: Self-pay

## 2020-09-25 ENCOUNTER — Telehealth: Payer: Self-pay

## 2020-09-25 DIAGNOSIS — N921 Excessive and frequent menstruation with irregular cycle: Secondary | ICD-10-CM

## 2020-09-25 NOTE — Telephone Encounter (Signed)
Patient came into office requesting a refill on her medication until her appt that's set on 10/21/2020 she only has two pills left and takes 2 a day.... waning to know if we can send in for her    Please call and advise   megestrol (MEGACE) 20 MG tablet [[id number]   [address] [place], [place] [id number]

## 2020-09-25 NOTE — Telephone Encounter (Signed)
Call placed back to pt. No answer. Left VM and advised pt to call pharmacy for refills. Original Rx was placed 09/17/20 with 11 RF.   Colletta Maryland, RN

## 2020-09-26 DIAGNOSIS — E876 Hypokalemia: Secondary | ICD-10-CM | POA: Diagnosis not present

## 2020-09-26 DIAGNOSIS — L299 Pruritus, unspecified: Secondary | ICD-10-CM | POA: Diagnosis not present

## 2020-09-26 DIAGNOSIS — D631 Anemia in chronic kidney disease: Secondary | ICD-10-CM | POA: Diagnosis not present

## 2020-09-26 DIAGNOSIS — N186 End stage renal disease: Secondary | ICD-10-CM | POA: Diagnosis not present

## 2020-09-26 DIAGNOSIS — D689 Coagulation defect, unspecified: Secondary | ICD-10-CM | POA: Diagnosis not present

## 2020-09-26 DIAGNOSIS — N2581 Secondary hyperparathyroidism of renal origin: Secondary | ICD-10-CM | POA: Diagnosis not present

## 2020-09-26 DIAGNOSIS — Z992 Dependence on renal dialysis: Secondary | ICD-10-CM | POA: Diagnosis not present

## 2020-09-30 ENCOUNTER — Other Ambulatory Visit: Payer: Self-pay | Admitting: Family Medicine

## 2020-09-30 ENCOUNTER — Telehealth: Payer: Self-pay

## 2020-09-30 DIAGNOSIS — N921 Excessive and frequent menstruation with irregular cycle: Secondary | ICD-10-CM

## 2020-09-30 MED ORDER — MEGESTROL ACETATE 20 MG PO TABS: 40.0000 mg | ORAL_TABLET | Freq: Every day | ORAL | 0 refills | Status: DC

## 2020-09-30 NOTE — Telephone Encounter (Signed)
Call placed to pt. Spoke with pt. Pt states needing Rx Megace refill to get to appt for annual exam on 10/21/20 with Dr Higinio Plan. Pt given 1 month refill of Rx Megace. Pt aware and thankful for refill. Pt advised to keep appt as scheduled for further refills. Pt verbalized understanding.  Colletta Maryland, RN

## 2020-10-01 DIAGNOSIS — E876 Hypokalemia: Secondary | ICD-10-CM | POA: Diagnosis not present

## 2020-10-01 DIAGNOSIS — D631 Anemia in chronic kidney disease: Secondary | ICD-10-CM | POA: Diagnosis not present

## 2020-10-01 DIAGNOSIS — L299 Pruritus, unspecified: Secondary | ICD-10-CM | POA: Diagnosis not present

## 2020-10-01 DIAGNOSIS — D689 Coagulation defect, unspecified: Secondary | ICD-10-CM | POA: Diagnosis not present

## 2020-10-01 DIAGNOSIS — Z992 Dependence on renal dialysis: Secondary | ICD-10-CM | POA: Diagnosis not present

## 2020-10-01 DIAGNOSIS — N2581 Secondary hyperparathyroidism of renal origin: Secondary | ICD-10-CM | POA: Diagnosis not present

## 2020-10-01 DIAGNOSIS — N186 End stage renal disease: Secondary | ICD-10-CM | POA: Diagnosis not present

## 2020-10-03 DIAGNOSIS — N186 End stage renal disease: Secondary | ICD-10-CM | POA: Diagnosis not present

## 2020-10-03 DIAGNOSIS — N041 Nephrotic syndrome with focal and segmental glomerular lesions: Secondary | ICD-10-CM | POA: Diagnosis not present

## 2020-10-03 DIAGNOSIS — Z992 Dependence on renal dialysis: Secondary | ICD-10-CM | POA: Diagnosis not present

## 2020-10-06 DIAGNOSIS — D689 Coagulation defect, unspecified: Secondary | ICD-10-CM | POA: Diagnosis not present

## 2020-10-06 DIAGNOSIS — Z992 Dependence on renal dialysis: Secondary | ICD-10-CM | POA: Diagnosis not present

## 2020-10-06 DIAGNOSIS — L299 Pruritus, unspecified: Secondary | ICD-10-CM | POA: Diagnosis not present

## 2020-10-06 DIAGNOSIS — N2581 Secondary hyperparathyroidism of renal origin: Secondary | ICD-10-CM | POA: Diagnosis not present

## 2020-10-06 DIAGNOSIS — N186 End stage renal disease: Secondary | ICD-10-CM | POA: Diagnosis not present

## 2020-10-06 DIAGNOSIS — Z23 Encounter for immunization: Secondary | ICD-10-CM | POA: Diagnosis not present

## 2020-10-08 DIAGNOSIS — N186 End stage renal disease: Secondary | ICD-10-CM | POA: Diagnosis not present

## 2020-10-08 DIAGNOSIS — Z992 Dependence on renal dialysis: Secondary | ICD-10-CM | POA: Diagnosis not present

## 2020-10-08 DIAGNOSIS — N2581 Secondary hyperparathyroidism of renal origin: Secondary | ICD-10-CM | POA: Diagnosis not present

## 2020-10-08 DIAGNOSIS — Z23 Encounter for immunization: Secondary | ICD-10-CM | POA: Diagnosis not present

## 2020-10-08 DIAGNOSIS — L299 Pruritus, unspecified: Secondary | ICD-10-CM | POA: Diagnosis not present

## 2020-10-08 DIAGNOSIS — D689 Coagulation defect, unspecified: Secondary | ICD-10-CM | POA: Diagnosis not present

## 2020-10-09 ENCOUNTER — Other Ambulatory Visit: Payer: Self-pay | Admitting: Internal Medicine

## 2020-10-09 DIAGNOSIS — Z1231 Encounter for screening mammogram for malignant neoplasm of breast: Secondary | ICD-10-CM

## 2020-10-10 DIAGNOSIS — Z992 Dependence on renal dialysis: Secondary | ICD-10-CM | POA: Diagnosis not present

## 2020-10-10 DIAGNOSIS — N186 End stage renal disease: Secondary | ICD-10-CM | POA: Diagnosis not present

## 2020-10-10 DIAGNOSIS — N2581 Secondary hyperparathyroidism of renal origin: Secondary | ICD-10-CM | POA: Diagnosis not present

## 2020-10-10 DIAGNOSIS — Z23 Encounter for immunization: Secondary | ICD-10-CM | POA: Diagnosis not present

## 2020-10-10 DIAGNOSIS — L299 Pruritus, unspecified: Secondary | ICD-10-CM | POA: Diagnosis not present

## 2020-10-10 DIAGNOSIS — D689 Coagulation defect, unspecified: Secondary | ICD-10-CM | POA: Diagnosis not present

## 2020-10-14 ENCOUNTER — Encounter (HOSPITAL_COMMUNITY): Payer: Self-pay

## 2020-10-15 DIAGNOSIS — Z23 Encounter for immunization: Secondary | ICD-10-CM | POA: Diagnosis not present

## 2020-10-15 DIAGNOSIS — D689 Coagulation defect, unspecified: Secondary | ICD-10-CM | POA: Diagnosis not present

## 2020-10-15 DIAGNOSIS — N186 End stage renal disease: Secondary | ICD-10-CM | POA: Diagnosis not present

## 2020-10-15 DIAGNOSIS — Z992 Dependence on renal dialysis: Secondary | ICD-10-CM | POA: Diagnosis not present

## 2020-10-15 DIAGNOSIS — L299 Pruritus, unspecified: Secondary | ICD-10-CM | POA: Diagnosis not present

## 2020-10-15 DIAGNOSIS — N2581 Secondary hyperparathyroidism of renal origin: Secondary | ICD-10-CM | POA: Diagnosis not present

## 2020-10-17 DIAGNOSIS — N186 End stage renal disease: Secondary | ICD-10-CM | POA: Diagnosis not present

## 2020-10-17 DIAGNOSIS — D689 Coagulation defect, unspecified: Secondary | ICD-10-CM | POA: Diagnosis not present

## 2020-10-17 DIAGNOSIS — N2581 Secondary hyperparathyroidism of renal origin: Secondary | ICD-10-CM | POA: Diagnosis not present

## 2020-10-17 DIAGNOSIS — Z23 Encounter for immunization: Secondary | ICD-10-CM | POA: Diagnosis not present

## 2020-10-17 DIAGNOSIS — L299 Pruritus, unspecified: Secondary | ICD-10-CM | POA: Diagnosis not present

## 2020-10-17 DIAGNOSIS — Z992 Dependence on renal dialysis: Secondary | ICD-10-CM | POA: Diagnosis not present

## 2020-10-21 ENCOUNTER — Encounter: Payer: Self-pay | Admitting: Family Medicine

## 2020-10-21 ENCOUNTER — Ambulatory Visit (INDEPENDENT_AMBULATORY_CARE_PROVIDER_SITE_OTHER): Payer: Medicare Other | Admitting: Family Medicine

## 2020-10-21 ENCOUNTER — Other Ambulatory Visit (HOSPITAL_COMMUNITY)
Admission: RE | Admit: 2020-10-21 | Discharge: 2020-10-21 | Disposition: A | Discharge status: Home / Self Care | Payer: Medicare Other | Source: Ambulatory Visit | Attending: Family Medicine | Admitting: Family Medicine

## 2020-10-21 ENCOUNTER — Other Ambulatory Visit: Payer: Self-pay

## 2020-10-21 VITALS — BP 133/76 | HR 70 | Ht 69.0 in | Wt 315.0 lb

## 2020-10-21 DIAGNOSIS — Z124 Encounter for screening for malignant neoplasm of cervix: Secondary | ICD-10-CM | POA: Diagnosis not present

## 2020-10-21 DIAGNOSIS — Z01419 Encounter for gynecological examination (general) (routine) without abnormal findings: Secondary | ICD-10-CM

## 2020-10-21 DIAGNOSIS — N921 Excessive and frequent menstruation with irregular cycle: Secondary | ICD-10-CM

## 2020-10-21 DIAGNOSIS — N186 End stage renal disease: Secondary | ICD-10-CM | POA: Diagnosis not present

## 2020-10-21 DIAGNOSIS — Z113 Encounter for screening for infections with a predominantly sexual mode of transmission: Secondary | ICD-10-CM | POA: Diagnosis not present

## 2020-10-21 DIAGNOSIS — T829XXA Unspecified complication of cardiac and vascular prosthetic device, implant and graft, initial encounter: Secondary | ICD-10-CM | POA: Diagnosis present

## 2020-10-21 DIAGNOSIS — Z1151 Encounter for screening for human papillomavirus (HPV): Secondary | ICD-10-CM | POA: Diagnosis not present

## 2020-10-21 DIAGNOSIS — Z992 Dependence on renal dialysis: Secondary | ICD-10-CM

## 2020-10-21 MED ORDER — MEGESTROL ACETATE 20 MG PO TABS: 40.0000 mg | ORAL_TABLET | Freq: Every day | ORAL | 11 refills | Status: AC

## 2020-10-21 NOTE — Progress Notes (Signed)
GYNECOLOGY ANNUAL PREVENTATIVE CARE ENCOUNTER NOTE  History:     [name] is a 45 y.o. female here for a routine annual gynecologic exam.  Current complaints: None. Reports she is doing well. Denies abnormal vaginal bleeding (on megace-history of AUB with severe anemia at that time), discharge, pelvic pain, problems with intercourse or other gynecologic concerns.   She plans to reestablish care with her PCP Dr. Baird Cancer. Follows with nephrology for her ESRD on HD.    Gynecologic History No LMP recorded. Patient is perimenopausal. Contraception: Reports rare sexual activity, uses condom when active.  Last Pap: 10/2017. Results were: normal with negative HPV Last mammogram: 10/2019. Results were: normal. Next scheduled for 11/07/2020.  No family history of breast or colon cancer.   Patient Active Problem List   Diagnosis Date Noted   ESRD (end stage renal disease) on dialysis ([place]) 10/21/2020   Anemia in chronic kidney disease (CKD) 07/31/2015   Menorrhagia with irregular cycle 02/27/2014   Essential hypertension 02/27/2014   Menorrhagia 01/27/2013   HYPERLIPIDEMIA-MIXED 04/20/2008   OBESITY-MORBID (>100') 04/20/2008   MITRAL REGURGITATION 04/20/2008   CARDIOMYOPATHY, DILATED 04/20/2008     Obstetric History OB History  No obstetric history on file.    Past Surgical History:  Procedure Laterality Date   ANGIOPLASTY  07/28/2015   Procedure: ANGIOPLASTY OF RIGHT BRACHIAL VEIN;  Surgeon: Conrad Murdock, MD;  Location: [place];  Service: Vascular;;   AV FISTULA PLACEMENT  08/04/2011   Procedure: ARTERIOVENOUS (AV) FISTULA CREATION;  Surgeon: Elam Dutch, MD;  Location: [place];  Service: Vascular;  Laterality: Right;  Creation right brachiocephalic arteriovenous fistula   AV FISTULA PLACEMENT Left 02/05/2013   Procedure: ARTERIOVENOUS FISTULA CREATION LEFT ARM;  Surgeon: Conrad Weir, MD;  Location: [place];  Service: Vascular;  Laterality: Left;   AV FISTULA PLACEMENT Right  06/04/2013   Procedure: ARTERIOVENOUS (AV) FISTULA CREATION;  Surgeon: Conrad Shelby, MD;  Location: [place];  Service: Vascular;  Laterality: Right;   AV FISTULA PLACEMENT Right 04/15/2014   Procedure: INSERTION OF ARTERIOVENOUS (AV) GORE-TEX GRAFT ARM;  Surgeon: Conrad [place], MD;  Location: [place];  Service: Vascular;  Laterality: Right;   [place] Right 08/13/2013   Procedure: Second Stage Brachial Vein Transposition;  Surgeon: Conrad [place], MD;  Location: [place];  Service: Vascular;  Laterality: Right;   CARDIAC SURGERY  1979   repair of hole in heart   INCISION AND DRAINAGE OF WOUND Right 07/28/2015   Procedure: IRRIGATION AND DEBRIDEMENT RIGHT HAND  WOUND;  Surgeon: Dayna Barker, MD;  Location: [place];  Service: Plastics;  Laterality: Right;   LIGATION OF ARTERIOVENOUS  FISTULA Left 03/05/2013   Procedure: LIGATION OF ARTERIOVENOUS  FISTULA;  Surgeon: Conrad [place], MD;  Location: [place];  Service: Vascular;  Laterality: Left;   PATCH ANGIOPLASTY Left 03/05/2013   Procedure: PATCH ANGIOPLASTY;  Surgeon: Conrad Lobel[place], MD;  Location: [place];  Service: Vascular;  Laterality: Left;   PERIPHERAL VASCULAR CATHETERIZATION Right 10/31/2014   Procedure: Fistulagram;  Surgeon: Conrad [place], MD;  Location: [place] CV LAB;  Service: Cardiovascular;  Laterality: Right;   PERIPHERAL VASCULAR CATHETERIZATION Right 10/31/2014   Procedure: Peripheral Vascular Balloon Angioplasty;  Surgeon: Conrad [place], MD;  Location: [place] CV LAB;  Service: Cardiovascular;  Laterality: Right;  pta venous rt arm   PERIPHERAL VASCULAR CATHETERIZATION N/A 03/13/2015   Procedure: A/V Shuntogram;  Surgeon: Conrad Haskell, MD;  Location: [place] CV LAB;  Service: Cardiovascular;  Laterality: N/A;   PERIPHERAL VASCULAR CATHETERIZATION Right 03/13/2015   Procedure: Peripheral Vascular Balloon Angioplasty;  Surgeon: Conrad [place], MD;  Location: [place] CV LAB;  Service: Cardiovascular;  Laterality: Right;  Shunt    PERIPHERAL VASCULAR CATHETERIZATION Right 01/19/2016   Procedure: A/V Shuntogram;  Surgeon: Angelia Mould, MD;  Location: [place] CV LAB;  Service: Cardiovascular;  Laterality: Right;   REVISION OF ARTERIOVENOUS GORETEX GRAFT Right 10/27/2015   Procedure: THROMBECTOMY AND REVISION OF RIGHT UPPER ARM  ARTERIOVENOUS GORETEX GRAFT USING 6MM X 10CM GORE-TEX GRAFT;  Surgeon: Elam Dutch, MD;  Location: [place];  Service: Vascular;  Laterality: Right;   REVISION OF ARTERIOVENOUS GORETEX GRAFT Right 01/30/2016   Procedure: REVISION OF RIGHT UPPER ARM ARTERIOVENOUS USING GORETEX GRAFT;  Surgeon: Angelia Mould, MD;  Location: [place];  Service: Vascular;  Laterality: Right;   THROMBECTOMY W/ EMBOLECTOMY Left 03/05/2013   Procedure: THROMBECTOMY OF LEFT BRACHIAL ARTERY;  Surgeon: Conrad [place]worth, MD;  Location: [place];  Service: Vascular;  Laterality: Left;   VENOGRAM Bilateral 03/25/2014   Procedure: VENOGRAM bilateral;  Surgeon: Conrad Bl[place], MD;  Location: [facility] CATH LAB;  Service: Cardiovascular;  Laterality: Bilateral;    Current Outpatient Medications on File Prior to Visit  Medication Sig Dispense Refill   PROAIR HFA 108 (90 Base) MCG/ACT inhaler Inhale 1-2 puffs into the lungs 3 (three) times daily as needed for wheezing or shortness of breath. For wheezing.  2   No current facility-administered medications on file prior to visit.    Allergies  Allergen Reactions   Pollen Extract     Other reaction(s): Unknown   Oxycodone-Acetaminophen     Pt states this makes her itch, but she take it with a benadryl to help    Social History:  reports that she has never smoked. She has never used smokeless tobacco. She reports that she does not drink alcohol and does not use drugs.  Family History  Problem Relation Age of Onset   Diabetes Mother    Hyperlipidemia Mother    Hypertension Mother    Kidney disease Mother    Anesthesia problems Neg Hx    Hypotension Neg Hx    Malignant  hyperthermia Neg Hx    Pseudochol deficiency Neg Hx     The following portions of the patient's history were reviewed and updated as appropriate: allergies, current medications, past family history, past medical history, past social history, past surgical history and problem list.  Review of Systems Pertinent items noted in HPI and remainder of comprehensive ROS otherwise negative.  Physical Exam:  BP 133/76   Pulse 70   Ht 5\' 9"  (1.753 m)   Wt (!) 315 lb (142.9 kg)   BMI 46.52 kg/m  CONSTITUTIONAL: Well-developed, well-nourished female in no acute distress.  HENT:  Normocephalic, atraumatic. Oropharynx is clear and moist EYES: Conjunctivae and EOM are normal. No scleral icterus.  NECK: Normal range of motion. SKIN: Skin is warm and dry. AV graft present on R upper arm, palpable thrill present.  MUSCULOSKELETAL: Normal range of motion.  NEUROLOGIC: Alert and oriented to person, place, and time. Normal reflexes, muscle tone coordination.  PSYCHIATRIC: Normal mood and affect. Normal behavior. Normal judgment and thought content. CARDIOVASCULAR: Normal pulses bilaterally.  RESPIRATORY: Effort and breath sounds normal, no problems with respiration noted. ABDOMEN: Soft, no distention noted.  No tenderness throughout.  PELVIC: Normal appearing external genitalia and urethral  meatus; normal appearing vaginal mucosa and cervix without lesions.  No abnormal discharge noted.  Pap smear +STD screening obtained.  Normal uterine size, no other palpable masses, no uterine or adnexal tenderness.  Performed in the presence of a chaperone.   Assessment and Plan:  1. Women's annual routine gynecological examination Doing well. Updated problem and medication list. Pap as below, mammogram in a few weeks. Discussed colonoscopy, will continue to consider. Encouraged re-establishing with PCP.   2. Screening for STD (sexually transmitted disease) Asymptomatic, screening only without known exposure.  -  Cytology - PAP( [place]) - RPR - Hepatitis B Surface AntiGEN - HIV Antibody (routine testing w rflx) - Gc/ch/trich on pap   3. Encounter for Papanicolaou smear of cervix Previous pap normal. Will f/u and manage accordingly.  - Cytology - PAP( [place])  4. Menorrhagia with irregular cycle Previously counseled on alternative options including IUD, ablation etc. Opted to continue with medication.   - megestrol (MEGACE) 20 MG tablet; Take 2 tablets (40 mg total) by mouth daily.    5. ESRD (end stage renal disease) on dialysis [facility]) Follows with nephrology.    Routine preventative health maintenance measures emphasized. Please refer to After Visit Summary for other counseling recommendations.     Darrelyn Hillock, DO  OB Fellow, [place] for [place] 10/21/2020 1:58 PM

## 2020-10-22 DIAGNOSIS — Z23 Encounter for immunization: Secondary | ICD-10-CM | POA: Diagnosis not present

## 2020-10-22 DIAGNOSIS — L299 Pruritus, unspecified: Secondary | ICD-10-CM | POA: Diagnosis not present

## 2020-10-22 DIAGNOSIS — D689 Coagulation defect, unspecified: Secondary | ICD-10-CM | POA: Diagnosis not present

## 2020-10-22 DIAGNOSIS — Z992 Dependence on renal dialysis: Secondary | ICD-10-CM | POA: Diagnosis not present

## 2020-10-22 DIAGNOSIS — N186 End stage renal disease: Secondary | ICD-10-CM | POA: Diagnosis not present

## 2020-10-22 DIAGNOSIS — N2581 Secondary hyperparathyroidism of renal origin: Secondary | ICD-10-CM | POA: Diagnosis not present

## 2020-10-22 LAB — CYTOLOGY - PAP
Chlamydia: POSITIVE — AB
Comment: NEGATIVE
Comment: NEGATIVE
Comment: NEGATIVE
Comment: NORMAL
Diagnosis: NEGATIVE
High risk HPV: NEGATIVE
Neisseria Gonorrhea: NEGATIVE
Trichomonas: NEGATIVE

## 2020-10-22 LAB — HEPATITIS B SURFACE ANTIGEN: Hepatitis B Surface Ag: NEGATIVE

## 2020-10-22 LAB — HIV ANTIBODY (ROUTINE TESTING W REFLEX): HIV Screen 4th Generation wRfx: NONREACTIVE

## 2020-10-22 LAB — RPR: RPR Ser Ql: NONREACTIVE

## 2020-10-23 ENCOUNTER — Other Ambulatory Visit: Payer: Self-pay | Admitting: Family Medicine

## 2020-10-23 DIAGNOSIS — A749 Chlamydial infection, unspecified: Secondary | ICD-10-CM

## 2020-10-23 MED ORDER — DOXYCYCLINE HYCLATE 100 MG PO CAPS: 100.0000 mg | ORAL_CAPSULE | Freq: Two times a day (BID) | ORAL | 0 refills | Status: AC

## 2020-10-24 DIAGNOSIS — Z23 Encounter for immunization: Secondary | ICD-10-CM | POA: Diagnosis not present

## 2020-10-24 DIAGNOSIS — Z992 Dependence on renal dialysis: Secondary | ICD-10-CM | POA: Diagnosis not present

## 2020-10-24 DIAGNOSIS — D689 Coagulation defect, unspecified: Secondary | ICD-10-CM | POA: Diagnosis not present

## 2020-10-24 DIAGNOSIS — N186 End stage renal disease: Secondary | ICD-10-CM | POA: Diagnosis not present

## 2020-10-24 DIAGNOSIS — N2581 Secondary hyperparathyroidism of renal origin: Secondary | ICD-10-CM | POA: Diagnosis not present

## 2020-10-24 DIAGNOSIS — L299 Pruritus, unspecified: Secondary | ICD-10-CM | POA: Diagnosis not present

## 2020-10-27 DIAGNOSIS — D689 Coagulation defect, unspecified: Secondary | ICD-10-CM | POA: Diagnosis not present

## 2020-10-27 DIAGNOSIS — L299 Pruritus, unspecified: Secondary | ICD-10-CM | POA: Diagnosis not present

## 2020-10-27 DIAGNOSIS — Z992 Dependence on renal dialysis: Secondary | ICD-10-CM | POA: Diagnosis not present

## 2020-10-27 DIAGNOSIS — N2581 Secondary hyperparathyroidism of renal origin: Secondary | ICD-10-CM | POA: Diagnosis not present

## 2020-10-27 DIAGNOSIS — N186 End stage renal disease: Secondary | ICD-10-CM | POA: Diagnosis not present

## 2020-10-27 DIAGNOSIS — Z23 Encounter for immunization: Secondary | ICD-10-CM | POA: Diagnosis not present

## 2020-10-28 ENCOUNTER — Telehealth: Payer: Self-pay | Admitting: Family Medicine

## 2020-10-28 NOTE — Telephone Encounter (Signed)
Please attempt to contact patient a second time regarding positive Chlamydia results. STD form also needs to be faxed to [facility].

## 2020-10-28 NOTE — Telephone Encounter (Signed)
Saw patient had not read recent MyChart result with positive chlamydia. Called patient to check in. No answer and left voicemail requesting to check her mychart or call the office. Doxycycline already sent to her pharmacy.   Patriciaann Clan, DO

## 2020-10-29 DIAGNOSIS — Z23 Encounter for immunization: Secondary | ICD-10-CM | POA: Diagnosis not present

## 2020-10-29 DIAGNOSIS — N2581 Secondary hyperparathyroidism of renal origin: Secondary | ICD-10-CM | POA: Diagnosis not present

## 2020-10-29 DIAGNOSIS — N186 End stage renal disease: Secondary | ICD-10-CM | POA: Diagnosis not present

## 2020-10-29 DIAGNOSIS — L299 Pruritus, unspecified: Secondary | ICD-10-CM | POA: Diagnosis not present

## 2020-10-29 DIAGNOSIS — Z992 Dependence on renal dialysis: Secondary | ICD-10-CM | POA: Diagnosis not present

## 2020-10-29 DIAGNOSIS — D689 Coagulation defect, unspecified: Secondary | ICD-10-CM | POA: Diagnosis not present

## 2020-10-29 NOTE — Telephone Encounter (Signed)
Left message for pt that we are attempting to contact her about results.  If she could please call the office, read MyChart message, and please go to your pharmacy to pick up treatment.    Mel Almond, RN  10/29/20

## 2020-11-03 DIAGNOSIS — Z992 Dependence on renal dialysis: Secondary | ICD-10-CM | POA: Diagnosis not present

## 2020-11-03 DIAGNOSIS — N041 Nephrotic syndrome with focal and segmental glomerular lesions: Secondary | ICD-10-CM | POA: Diagnosis not present

## 2020-11-03 DIAGNOSIS — N186 End stage renal disease: Secondary | ICD-10-CM | POA: Diagnosis not present

## 2020-11-03 DIAGNOSIS — L299 Pruritus, unspecified: Secondary | ICD-10-CM | POA: Diagnosis not present

## 2020-11-03 DIAGNOSIS — Z23 Encounter for immunization: Secondary | ICD-10-CM | POA: Diagnosis not present

## 2020-11-03 DIAGNOSIS — D689 Coagulation defect, unspecified: Secondary | ICD-10-CM | POA: Diagnosis not present

## 2020-11-03 DIAGNOSIS — N2581 Secondary hyperparathyroidism of renal origin: Secondary | ICD-10-CM | POA: Diagnosis not present

## 2020-11-07 ENCOUNTER — Other Ambulatory Visit: Payer: Self-pay

## 2020-11-07 ENCOUNTER — Encounter (HOSPITAL_COMMUNITY): Payer: Self-pay

## 2020-11-07 ENCOUNTER — Ambulatory Visit
Admission: RE | Admit: 2020-11-07 | Discharge: 2020-11-07 | Disposition: A | Discharge status: Home / Self Care | Payer: BC Managed Care – PPO | Source: Ambulatory Visit | Attending: Internal Medicine | Admitting: Internal Medicine

## 2020-11-07 DIAGNOSIS — N186 End stage renal disease: Secondary | ICD-10-CM | POA: Diagnosis not present

## 2020-11-07 DIAGNOSIS — Z1231 Encounter for screening mammogram for malignant neoplasm of breast: Secondary | ICD-10-CM | POA: Diagnosis not present

## 2020-11-07 DIAGNOSIS — D689 Coagulation defect, unspecified: Secondary | ICD-10-CM | POA: Diagnosis not present

## 2020-11-07 DIAGNOSIS — N2581 Secondary hyperparathyroidism of renal origin: Secondary | ICD-10-CM | POA: Diagnosis not present

## 2020-11-07 DIAGNOSIS — Z992 Dependence on renal dialysis: Secondary | ICD-10-CM | POA: Diagnosis not present

## 2020-11-07 DIAGNOSIS — L299 Pruritus, unspecified: Secondary | ICD-10-CM | POA: Diagnosis not present

## 2020-11-10 DIAGNOSIS — N186 End stage renal disease: Secondary | ICD-10-CM | POA: Diagnosis not present

## 2020-11-10 DIAGNOSIS — Z992 Dependence on renal dialysis: Secondary | ICD-10-CM | POA: Diagnosis not present

## 2020-11-10 DIAGNOSIS — L299 Pruritus, unspecified: Secondary | ICD-10-CM | POA: Diagnosis not present

## 2020-11-10 DIAGNOSIS — N2581 Secondary hyperparathyroidism of renal origin: Secondary | ICD-10-CM | POA: Diagnosis not present

## 2020-11-10 DIAGNOSIS — D689 Coagulation defect, unspecified: Secondary | ICD-10-CM | POA: Diagnosis not present

## 2020-11-14 DIAGNOSIS — N186 End stage renal disease: Secondary | ICD-10-CM | POA: Diagnosis not present

## 2020-11-14 DIAGNOSIS — L299 Pruritus, unspecified: Secondary | ICD-10-CM | POA: Diagnosis not present

## 2020-11-14 DIAGNOSIS — Z992 Dependence on renal dialysis: Secondary | ICD-10-CM | POA: Diagnosis not present

## 2020-11-14 DIAGNOSIS — D689 Coagulation defect, unspecified: Secondary | ICD-10-CM | POA: Diagnosis not present

## 2020-11-14 DIAGNOSIS — N2581 Secondary hyperparathyroidism of renal origin: Secondary | ICD-10-CM | POA: Diagnosis not present

## 2020-11-19 DIAGNOSIS — N2581 Secondary hyperparathyroidism of renal origin: Secondary | ICD-10-CM | POA: Diagnosis not present

## 2020-11-19 DIAGNOSIS — Z992 Dependence on renal dialysis: Secondary | ICD-10-CM | POA: Diagnosis not present

## 2020-11-19 DIAGNOSIS — D689 Coagulation defect, unspecified: Secondary | ICD-10-CM | POA: Diagnosis not present

## 2020-11-19 DIAGNOSIS — N186 End stage renal disease: Secondary | ICD-10-CM | POA: Diagnosis not present

## 2020-11-19 DIAGNOSIS — L299 Pruritus, unspecified: Secondary | ICD-10-CM | POA: Diagnosis not present

## 2020-11-24 DIAGNOSIS — D689 Coagulation defect, unspecified: Secondary | ICD-10-CM | POA: Diagnosis not present

## 2020-11-24 DIAGNOSIS — L299 Pruritus, unspecified: Secondary | ICD-10-CM | POA: Diagnosis not present

## 2020-11-24 DIAGNOSIS — N2581 Secondary hyperparathyroidism of renal origin: Secondary | ICD-10-CM | POA: Diagnosis not present

## 2020-11-24 DIAGNOSIS — Z992 Dependence on renal dialysis: Secondary | ICD-10-CM | POA: Diagnosis not present

## 2020-11-24 DIAGNOSIS — N186 End stage renal disease: Secondary | ICD-10-CM | POA: Diagnosis not present

## 2020-12-01 ENCOUNTER — Telehealth: Payer: Self-pay

## 2020-12-01 DIAGNOSIS — D689 Coagulation defect, unspecified: Secondary | ICD-10-CM | POA: Diagnosis not present

## 2020-12-01 DIAGNOSIS — N186 End stage renal disease: Secondary | ICD-10-CM | POA: Diagnosis not present

## 2020-12-01 DIAGNOSIS — L299 Pruritus, unspecified: Secondary | ICD-10-CM | POA: Diagnosis not present

## 2020-12-01 DIAGNOSIS — N2581 Secondary hyperparathyroidism of renal origin: Secondary | ICD-10-CM | POA: Diagnosis not present

## 2020-12-01 DIAGNOSIS — Z992 Dependence on renal dialysis: Secondary | ICD-10-CM | POA: Diagnosis not present

## 2020-12-02 NOTE — Telephone Encounter (Signed)
Pt returned call from [place], [place] today @ 1:05 pm and left voicemail message stating that she had received the test results, got the medication and is now ready to schedule appointment as instructed. I returned pt call to discuss appt. She agreed to appt on 12/6 @ 2:00 pm for nurse visit - TOC self swab.

## 2020-12-02 NOTE — Telephone Encounter (Signed)
During specimen log audit discovered pt never confirmed receipt of results or scheduled test of cure appt. Called pt; VM left stating I am calling to follow up on results and to schedule new appt. Pt encouraged to return phone call or send MyChart message.

## 2020-12-03 DIAGNOSIS — N041 Nephrotic syndrome with focal and segmental glomerular lesions: Secondary | ICD-10-CM | POA: Diagnosis not present

## 2020-12-03 DIAGNOSIS — N186 End stage renal disease: Secondary | ICD-10-CM | POA: Diagnosis not present

## 2020-12-03 DIAGNOSIS — Z992 Dependence on renal dialysis: Secondary | ICD-10-CM | POA: Diagnosis not present

## 2020-12-05 DIAGNOSIS — D689 Coagulation defect, unspecified: Secondary | ICD-10-CM | POA: Diagnosis not present

## 2020-12-05 DIAGNOSIS — L299 Pruritus, unspecified: Secondary | ICD-10-CM | POA: Diagnosis not present

## 2020-12-05 DIAGNOSIS — N2581 Secondary hyperparathyroidism of renal origin: Secondary | ICD-10-CM | POA: Diagnosis not present

## 2020-12-05 DIAGNOSIS — N186 End stage renal disease: Secondary | ICD-10-CM | POA: Diagnosis not present

## 2020-12-05 DIAGNOSIS — Z992 Dependence on renal dialysis: Secondary | ICD-10-CM | POA: Diagnosis not present

## 2020-12-09 ENCOUNTER — Other Ambulatory Visit (HOSPITAL_COMMUNITY)
Admission: RE | Admit: 2020-12-09 | Discharge: 2020-12-09 | Disposition: A | Discharge status: Home / Self Care | Payer: Medicare Other | Source: Ambulatory Visit | Attending: Family Medicine | Admitting: Family Medicine

## 2020-12-09 ENCOUNTER — Other Ambulatory Visit: Payer: Self-pay

## 2020-12-09 ENCOUNTER — Ambulatory Visit (INDEPENDENT_AMBULATORY_CARE_PROVIDER_SITE_OTHER): Payer: BC Managed Care – PPO

## 2020-12-09 VITALS — BP 165/94 | HR 77 | Ht 69.0 in | Wt 315.4 lb

## 2020-12-09 DIAGNOSIS — Z113 Encounter for screening for infections with a predominantly sexual mode of transmission: Secondary | ICD-10-CM | POA: Diagnosis present

## 2020-12-09 DIAGNOSIS — A749 Chlamydial infection, unspecified: Secondary | ICD-10-CM

## 2020-12-09 NOTE — Progress Notes (Signed)
Patient was assessed and managed by nursing staff during this encounter. I have reviewed the chart and agree with the documentation and plan. I have also made any necessary editorial changes.  Marcille Buffy DNP, CNM  12/09/20  4:28 PM

## 2020-12-09 NOTE — Progress Notes (Signed)
Pt here today for TOC after + Chlamydia on 10/21/20. Self swab collected today. Pt advised results will take 24-48 hours and will see results in mychart and will be notified if needs further treatment. Pt verbalized understanding.   [name]

## 2020-12-10 DIAGNOSIS — D689 Coagulation defect, unspecified: Secondary | ICD-10-CM | POA: Diagnosis not present

## 2020-12-10 DIAGNOSIS — N2581 Secondary hyperparathyroidism of renal origin: Secondary | ICD-10-CM | POA: Diagnosis not present

## 2020-12-10 DIAGNOSIS — N186 End stage renal disease: Secondary | ICD-10-CM | POA: Diagnosis not present

## 2020-12-10 DIAGNOSIS — Z992 Dependence on renal dialysis: Secondary | ICD-10-CM | POA: Diagnosis not present

## 2020-12-10 DIAGNOSIS — L299 Pruritus, unspecified: Secondary | ICD-10-CM | POA: Diagnosis not present

## 2020-12-10 LAB — CERVICOVAGINAL ANCILLARY ONLY
Bacterial Vaginitis (gardnerella): POSITIVE — AB
Candida Glabrata: NEGATIVE
Candida Vaginitis: NEGATIVE
Chlamydia: NEGATIVE
Comment: NEGATIVE
Comment: NEGATIVE
Comment: NEGATIVE
Comment: NEGATIVE
Comment: NEGATIVE
Comment: NORMAL
Neisseria Gonorrhea: NEGATIVE
Trichomonas: NEGATIVE

## 2020-12-17 DIAGNOSIS — D689 Coagulation defect, unspecified: Secondary | ICD-10-CM | POA: Diagnosis not present

## 2020-12-17 DIAGNOSIS — N186 End stage renal disease: Secondary | ICD-10-CM | POA: Diagnosis not present

## 2020-12-17 DIAGNOSIS — N2581 Secondary hyperparathyroidism of renal origin: Secondary | ICD-10-CM | POA: Diagnosis not present

## 2020-12-17 DIAGNOSIS — L299 Pruritus, unspecified: Secondary | ICD-10-CM | POA: Diagnosis not present

## 2020-12-17 DIAGNOSIS — Z992 Dependence on renal dialysis: Secondary | ICD-10-CM | POA: Diagnosis not present

## 2020-12-22 DIAGNOSIS — Z992 Dependence on renal dialysis: Secondary | ICD-10-CM | POA: Diagnosis not present

## 2020-12-22 DIAGNOSIS — N186 End stage renal disease: Secondary | ICD-10-CM | POA: Diagnosis not present

## 2020-12-22 DIAGNOSIS — D689 Coagulation defect, unspecified: Secondary | ICD-10-CM | POA: Diagnosis not present

## 2020-12-22 DIAGNOSIS — N2581 Secondary hyperparathyroidism of renal origin: Secondary | ICD-10-CM | POA: Diagnosis not present

## 2020-12-22 DIAGNOSIS — L299 Pruritus, unspecified: Secondary | ICD-10-CM | POA: Diagnosis not present

## 2020-12-29 DIAGNOSIS — N2581 Secondary hyperparathyroidism of renal origin: Secondary | ICD-10-CM | POA: Diagnosis not present

## 2020-12-29 DIAGNOSIS — N186 End stage renal disease: Secondary | ICD-10-CM | POA: Diagnosis not present

## 2020-12-29 DIAGNOSIS — Z992 Dependence on renal dialysis: Secondary | ICD-10-CM | POA: Diagnosis not present

## 2020-12-29 DIAGNOSIS — L299 Pruritus, unspecified: Secondary | ICD-10-CM | POA: Diagnosis not present

## 2020-12-29 DIAGNOSIS — D689 Coagulation defect, unspecified: Secondary | ICD-10-CM | POA: Diagnosis not present

## 2020-12-31 ENCOUNTER — Telehealth: Payer: Self-pay | Admitting: Lactation Services

## 2020-12-31 NOTE — Telephone Encounter (Signed)
Called patient to inform her last swab showed she has BV and to assess symptoms.   Patient did not answer. LM for her to check her My chart message or to return call to the office for non urgent results.

## 2021-01-01 DIAGNOSIS — L299 Pruritus, unspecified: Secondary | ICD-10-CM | POA: Diagnosis not present

## 2021-01-01 DIAGNOSIS — N186 End stage renal disease: Secondary | ICD-10-CM | POA: Diagnosis not present

## 2021-01-01 DIAGNOSIS — N2581 Secondary hyperparathyroidism of renal origin: Secondary | ICD-10-CM | POA: Diagnosis not present

## 2021-01-01 DIAGNOSIS — D689 Coagulation defect, unspecified: Secondary | ICD-10-CM | POA: Diagnosis not present

## 2021-01-01 DIAGNOSIS — Z992 Dependence on renal dialysis: Secondary | ICD-10-CM | POA: Diagnosis not present

## 2021-01-02 DIAGNOSIS — Z992 Dependence on renal dialysis: Secondary | ICD-10-CM | POA: Diagnosis not present

## 2021-01-02 DIAGNOSIS — N186 End stage renal disease: Secondary | ICD-10-CM | POA: Diagnosis not present

## 2021-01-02 DIAGNOSIS — N2581 Secondary hyperparathyroidism of renal origin: Secondary | ICD-10-CM | POA: Diagnosis not present

## 2021-01-02 DIAGNOSIS — D689 Coagulation defect, unspecified: Secondary | ICD-10-CM | POA: Diagnosis not present

## 2021-01-02 DIAGNOSIS — L299 Pruritus, unspecified: Secondary | ICD-10-CM | POA: Diagnosis not present

## 2021-01-03 DIAGNOSIS — N186 End stage renal disease: Secondary | ICD-10-CM | POA: Diagnosis not present

## 2021-01-03 DIAGNOSIS — Z992 Dependence on renal dialysis: Secondary | ICD-10-CM | POA: Diagnosis not present

## 2021-01-03 DIAGNOSIS — N041 Nephrotic syndrome with focal and segmental glomerular lesions: Secondary | ICD-10-CM | POA: Diagnosis not present

## 2021-01-07 DIAGNOSIS — L299 Pruritus, unspecified: Secondary | ICD-10-CM | POA: Diagnosis not present

## 2021-01-07 DIAGNOSIS — N2581 Secondary hyperparathyroidism of renal origin: Secondary | ICD-10-CM | POA: Diagnosis not present

## 2021-01-07 DIAGNOSIS — Z992 Dependence on renal dialysis: Secondary | ICD-10-CM | POA: Diagnosis not present

## 2021-01-07 DIAGNOSIS — N186 End stage renal disease: Secondary | ICD-10-CM | POA: Diagnosis not present

## 2021-01-07 DIAGNOSIS — D689 Coagulation defect, unspecified: Secondary | ICD-10-CM | POA: Diagnosis not present

## 2021-01-09 DIAGNOSIS — N186 End stage renal disease: Secondary | ICD-10-CM | POA: Diagnosis not present

## 2021-01-09 DIAGNOSIS — Z992 Dependence on renal dialysis: Secondary | ICD-10-CM | POA: Diagnosis not present

## 2021-01-09 DIAGNOSIS — N2581 Secondary hyperparathyroidism of renal origin: Secondary | ICD-10-CM | POA: Diagnosis not present

## 2021-01-09 DIAGNOSIS — L299 Pruritus, unspecified: Secondary | ICD-10-CM | POA: Diagnosis not present

## 2021-01-09 DIAGNOSIS — D689 Coagulation defect, unspecified: Secondary | ICD-10-CM | POA: Diagnosis not present

## 2021-01-12 DIAGNOSIS — N186 End stage renal disease: Secondary | ICD-10-CM | POA: Diagnosis not present

## 2021-01-12 DIAGNOSIS — Z992 Dependence on renal dialysis: Secondary | ICD-10-CM | POA: Diagnosis not present

## 2021-01-12 DIAGNOSIS — L299 Pruritus, unspecified: Secondary | ICD-10-CM | POA: Diagnosis not present

## 2021-01-12 DIAGNOSIS — D689 Coagulation defect, unspecified: Secondary | ICD-10-CM | POA: Diagnosis not present

## 2021-01-12 DIAGNOSIS — N2581 Secondary hyperparathyroidism of renal origin: Secondary | ICD-10-CM | POA: Diagnosis not present

## 2021-01-14 DIAGNOSIS — Z992 Dependence on renal dialysis: Secondary | ICD-10-CM | POA: Diagnosis not present

## 2021-01-14 DIAGNOSIS — N2581 Secondary hyperparathyroidism of renal origin: Secondary | ICD-10-CM | POA: Diagnosis not present

## 2021-01-14 DIAGNOSIS — D689 Coagulation defect, unspecified: Secondary | ICD-10-CM | POA: Diagnosis not present

## 2021-01-14 DIAGNOSIS — L299 Pruritus, unspecified: Secondary | ICD-10-CM | POA: Diagnosis not present

## 2021-01-14 DIAGNOSIS — N186 End stage renal disease: Secondary | ICD-10-CM | POA: Diagnosis not present

## 2021-01-19 DIAGNOSIS — N186 End stage renal disease: Secondary | ICD-10-CM | POA: Diagnosis not present

## 2021-01-19 DIAGNOSIS — Z992 Dependence on renal dialysis: Secondary | ICD-10-CM | POA: Diagnosis not present

## 2021-01-19 DIAGNOSIS — D689 Coagulation defect, unspecified: Secondary | ICD-10-CM | POA: Diagnosis not present

## 2021-01-19 DIAGNOSIS — L299 Pruritus, unspecified: Secondary | ICD-10-CM | POA: Diagnosis not present

## 2021-01-19 DIAGNOSIS — N2581 Secondary hyperparathyroidism of renal origin: Secondary | ICD-10-CM | POA: Diagnosis not present

## 2021-01-21 DIAGNOSIS — N186 End stage renal disease: Secondary | ICD-10-CM | POA: Diagnosis not present

## 2021-01-21 DIAGNOSIS — D689 Coagulation defect, unspecified: Secondary | ICD-10-CM | POA: Diagnosis not present

## 2021-01-21 DIAGNOSIS — L299 Pruritus, unspecified: Secondary | ICD-10-CM | POA: Diagnosis not present

## 2021-01-21 DIAGNOSIS — N2581 Secondary hyperparathyroidism of renal origin: Secondary | ICD-10-CM | POA: Diagnosis not present

## 2021-01-21 DIAGNOSIS — Z992 Dependence on renal dialysis: Secondary | ICD-10-CM | POA: Diagnosis not present

## 2021-01-23 DIAGNOSIS — L299 Pruritus, unspecified: Secondary | ICD-10-CM | POA: Diagnosis not present

## 2021-01-23 DIAGNOSIS — Z992 Dependence on renal dialysis: Secondary | ICD-10-CM | POA: Diagnosis not present

## 2021-01-23 DIAGNOSIS — N2581 Secondary hyperparathyroidism of renal origin: Secondary | ICD-10-CM | POA: Diagnosis not present

## 2021-01-23 DIAGNOSIS — D689 Coagulation defect, unspecified: Secondary | ICD-10-CM | POA: Diagnosis not present

## 2021-01-23 DIAGNOSIS — N186 End stage renal disease: Secondary | ICD-10-CM | POA: Diagnosis not present

## 2021-01-24 DIAGNOSIS — L299 Pruritus, unspecified: Secondary | ICD-10-CM | POA: Diagnosis not present

## 2021-01-24 DIAGNOSIS — N2581 Secondary hyperparathyroidism of renal origin: Secondary | ICD-10-CM | POA: Diagnosis not present

## 2021-01-24 DIAGNOSIS — D689 Coagulation defect, unspecified: Secondary | ICD-10-CM | POA: Diagnosis not present

## 2021-01-24 DIAGNOSIS — Z992 Dependence on renal dialysis: Secondary | ICD-10-CM | POA: Diagnosis not present

## 2021-01-24 DIAGNOSIS — N186 End stage renal disease: Secondary | ICD-10-CM | POA: Diagnosis not present

## 2021-01-26 DIAGNOSIS — D689 Coagulation defect, unspecified: Secondary | ICD-10-CM | POA: Diagnosis not present

## 2021-01-26 DIAGNOSIS — N2581 Secondary hyperparathyroidism of renal origin: Secondary | ICD-10-CM | POA: Diagnosis not present

## 2021-01-26 DIAGNOSIS — N186 End stage renal disease: Secondary | ICD-10-CM | POA: Diagnosis not present

## 2021-01-26 DIAGNOSIS — Z992 Dependence on renal dialysis: Secondary | ICD-10-CM | POA: Diagnosis not present

## 2021-01-26 DIAGNOSIS — L299 Pruritus, unspecified: Secondary | ICD-10-CM | POA: Diagnosis not present

## 2021-01-29 DIAGNOSIS — I871 Compression of vein: Secondary | ICD-10-CM | POA: Diagnosis not present

## 2021-01-29 DIAGNOSIS — N186 End stage renal disease: Secondary | ICD-10-CM | POA: Diagnosis not present

## 2021-01-29 DIAGNOSIS — Z992 Dependence on renal dialysis: Secondary | ICD-10-CM | POA: Diagnosis not present

## 2021-01-29 DIAGNOSIS — T82868A Thrombosis of vascular prosthetic devices, implants and grafts, initial encounter: Secondary | ICD-10-CM | POA: Diagnosis not present

## 2021-01-30 DIAGNOSIS — Z992 Dependence on renal dialysis: Secondary | ICD-10-CM | POA: Diagnosis not present

## 2021-01-30 DIAGNOSIS — N2581 Secondary hyperparathyroidism of renal origin: Secondary | ICD-10-CM | POA: Diagnosis not present

## 2021-01-30 DIAGNOSIS — N186 End stage renal disease: Secondary | ICD-10-CM | POA: Diagnosis not present

## 2021-01-30 DIAGNOSIS — D689 Coagulation defect, unspecified: Secondary | ICD-10-CM | POA: Diagnosis not present

## 2021-01-30 DIAGNOSIS — L299 Pruritus, unspecified: Secondary | ICD-10-CM | POA: Diagnosis not present

## 2021-02-02 DIAGNOSIS — T82868A Thrombosis of vascular prosthetic devices, implants and grafts, initial encounter: Secondary | ICD-10-CM | POA: Diagnosis not present

## 2021-02-02 DIAGNOSIS — D689 Coagulation defect, unspecified: Secondary | ICD-10-CM | POA: Diagnosis not present

## 2021-02-02 DIAGNOSIS — Z992 Dependence on renal dialysis: Secondary | ICD-10-CM | POA: Diagnosis not present

## 2021-02-02 DIAGNOSIS — N186 End stage renal disease: Secondary | ICD-10-CM | POA: Diagnosis not present

## 2021-02-02 DIAGNOSIS — N2581 Secondary hyperparathyroidism of renal origin: Secondary | ICD-10-CM | POA: Diagnosis not present

## 2021-02-02 DIAGNOSIS — L299 Pruritus, unspecified: Secondary | ICD-10-CM | POA: Diagnosis not present

## 2021-02-03 DIAGNOSIS — N041 Nephrotic syndrome with focal and segmental glomerular lesions: Secondary | ICD-10-CM | POA: Diagnosis not present

## 2021-02-03 DIAGNOSIS — N186 End stage renal disease: Secondary | ICD-10-CM | POA: Diagnosis not present

## 2021-02-03 DIAGNOSIS — Z992 Dependence on renal dialysis: Secondary | ICD-10-CM | POA: Diagnosis not present

## 2021-02-04 DIAGNOSIS — D689 Coagulation defect, unspecified: Secondary | ICD-10-CM | POA: Diagnosis not present

## 2021-02-04 DIAGNOSIS — L299 Pruritus, unspecified: Secondary | ICD-10-CM | POA: Diagnosis not present

## 2021-02-04 DIAGNOSIS — T8249XS Other complication of vascular dialysis catheter, sequela: Secondary | ICD-10-CM | POA: Diagnosis not present

## 2021-02-04 DIAGNOSIS — Z992 Dependence on renal dialysis: Secondary | ICD-10-CM | POA: Diagnosis not present

## 2021-02-04 DIAGNOSIS — N186 End stage renal disease: Secondary | ICD-10-CM | POA: Diagnosis not present

## 2021-02-04 DIAGNOSIS — T8249XA Other complication of vascular dialysis catheter, initial encounter: Secondary | ICD-10-CM | POA: Diagnosis not present

## 2021-02-04 DIAGNOSIS — N2581 Secondary hyperparathyroidism of renal origin: Secondary | ICD-10-CM | POA: Diagnosis not present

## 2021-02-06 DIAGNOSIS — T8249XA Other complication of vascular dialysis catheter, initial encounter: Secondary | ICD-10-CM | POA: Diagnosis not present

## 2021-02-06 DIAGNOSIS — Z992 Dependence on renal dialysis: Secondary | ICD-10-CM | POA: Diagnosis not present

## 2021-02-06 DIAGNOSIS — D689 Coagulation defect, unspecified: Secondary | ICD-10-CM | POA: Diagnosis not present

## 2021-02-06 DIAGNOSIS — N186 End stage renal disease: Secondary | ICD-10-CM | POA: Diagnosis not present

## 2021-02-06 DIAGNOSIS — L299 Pruritus, unspecified: Secondary | ICD-10-CM | POA: Diagnosis not present

## 2021-02-06 DIAGNOSIS — T8249XS Other complication of vascular dialysis catheter, sequela: Secondary | ICD-10-CM | POA: Diagnosis not present

## 2021-02-06 DIAGNOSIS — N2581 Secondary hyperparathyroidism of renal origin: Secondary | ICD-10-CM | POA: Diagnosis not present

## 2021-02-09 DIAGNOSIS — N186 End stage renal disease: Secondary | ICD-10-CM | POA: Diagnosis not present

## 2021-02-09 DIAGNOSIS — D689 Coagulation defect, unspecified: Secondary | ICD-10-CM | POA: Diagnosis not present

## 2021-02-09 DIAGNOSIS — Z992 Dependence on renal dialysis: Secondary | ICD-10-CM | POA: Diagnosis not present

## 2021-02-09 DIAGNOSIS — T8249XS Other complication of vascular dialysis catheter, sequela: Secondary | ICD-10-CM | POA: Diagnosis not present

## 2021-02-09 DIAGNOSIS — T8249XA Other complication of vascular dialysis catheter, initial encounter: Secondary | ICD-10-CM | POA: Diagnosis not present

## 2021-02-09 DIAGNOSIS — L299 Pruritus, unspecified: Secondary | ICD-10-CM | POA: Diagnosis not present

## 2021-02-09 DIAGNOSIS — N2581 Secondary hyperparathyroidism of renal origin: Secondary | ICD-10-CM | POA: Diagnosis not present

## 2021-02-11 DIAGNOSIS — Z992 Dependence on renal dialysis: Secondary | ICD-10-CM | POA: Diagnosis not present

## 2021-02-11 DIAGNOSIS — T8249XS Other complication of vascular dialysis catheter, sequela: Secondary | ICD-10-CM | POA: Diagnosis not present

## 2021-02-11 DIAGNOSIS — T8249XA Other complication of vascular dialysis catheter, initial encounter: Secondary | ICD-10-CM | POA: Diagnosis not present

## 2021-02-11 DIAGNOSIS — N186 End stage renal disease: Secondary | ICD-10-CM | POA: Diagnosis not present

## 2021-02-11 DIAGNOSIS — N2581 Secondary hyperparathyroidism of renal origin: Secondary | ICD-10-CM | POA: Diagnosis not present

## 2021-02-11 DIAGNOSIS — L299 Pruritus, unspecified: Secondary | ICD-10-CM | POA: Diagnosis not present

## 2021-02-11 DIAGNOSIS — D689 Coagulation defect, unspecified: Secondary | ICD-10-CM | POA: Diagnosis not present

## 2021-02-13 DIAGNOSIS — T8249XS Other complication of vascular dialysis catheter, sequela: Secondary | ICD-10-CM | POA: Diagnosis not present

## 2021-02-13 DIAGNOSIS — T8249XA Other complication of vascular dialysis catheter, initial encounter: Secondary | ICD-10-CM | POA: Diagnosis not present

## 2021-02-13 DIAGNOSIS — N2581 Secondary hyperparathyroidism of renal origin: Secondary | ICD-10-CM | POA: Diagnosis not present

## 2021-02-13 DIAGNOSIS — N186 End stage renal disease: Secondary | ICD-10-CM | POA: Diagnosis not present

## 2021-02-13 DIAGNOSIS — L299 Pruritus, unspecified: Secondary | ICD-10-CM | POA: Diagnosis not present

## 2021-02-13 DIAGNOSIS — D689 Coagulation defect, unspecified: Secondary | ICD-10-CM | POA: Diagnosis not present

## 2021-02-13 DIAGNOSIS — Z992 Dependence on renal dialysis: Secondary | ICD-10-CM | POA: Diagnosis not present

## 2021-02-16 ENCOUNTER — Other Ambulatory Visit: Payer: Self-pay

## 2021-02-16 DIAGNOSIS — D689 Coagulation defect, unspecified: Secondary | ICD-10-CM | POA: Diagnosis not present

## 2021-02-16 DIAGNOSIS — Z992 Dependence on renal dialysis: Secondary | ICD-10-CM

## 2021-02-16 DIAGNOSIS — L299 Pruritus, unspecified: Secondary | ICD-10-CM | POA: Diagnosis not present

## 2021-02-16 DIAGNOSIS — T8249XA Other complication of vascular dialysis catheter, initial encounter: Secondary | ICD-10-CM | POA: Diagnosis not present

## 2021-02-16 DIAGNOSIS — N186 End stage renal disease: Secondary | ICD-10-CM

## 2021-02-16 DIAGNOSIS — T8249XS Other complication of vascular dialysis catheter, sequela: Secondary | ICD-10-CM | POA: Diagnosis not present

## 2021-02-16 DIAGNOSIS — N2581 Secondary hyperparathyroidism of renal origin: Secondary | ICD-10-CM | POA: Diagnosis not present

## 2021-02-19 DIAGNOSIS — N186 End stage renal disease: Secondary | ICD-10-CM | POA: Diagnosis not present

## 2021-02-19 DIAGNOSIS — N2581 Secondary hyperparathyroidism of renal origin: Secondary | ICD-10-CM | POA: Diagnosis not present

## 2021-02-19 DIAGNOSIS — D689 Coagulation defect, unspecified: Secondary | ICD-10-CM | POA: Diagnosis not present

## 2021-02-19 DIAGNOSIS — T8249XS Other complication of vascular dialysis catheter, sequela: Secondary | ICD-10-CM | POA: Diagnosis not present

## 2021-02-19 DIAGNOSIS — T8249XA Other complication of vascular dialysis catheter, initial encounter: Secondary | ICD-10-CM | POA: Diagnosis not present

## 2021-02-19 DIAGNOSIS — Z992 Dependence on renal dialysis: Secondary | ICD-10-CM | POA: Diagnosis not present

## 2021-02-19 DIAGNOSIS — L299 Pruritus, unspecified: Secondary | ICD-10-CM | POA: Diagnosis not present

## 2021-02-20 DIAGNOSIS — N2581 Secondary hyperparathyroidism of renal origin: Secondary | ICD-10-CM | POA: Diagnosis not present

## 2021-02-20 DIAGNOSIS — T8249XS Other complication of vascular dialysis catheter, sequela: Secondary | ICD-10-CM | POA: Diagnosis not present

## 2021-02-20 DIAGNOSIS — T8249XA Other complication of vascular dialysis catheter, initial encounter: Secondary | ICD-10-CM | POA: Diagnosis not present

## 2021-02-20 DIAGNOSIS — D689 Coagulation defect, unspecified: Secondary | ICD-10-CM | POA: Diagnosis not present

## 2021-02-20 DIAGNOSIS — Z992 Dependence on renal dialysis: Secondary | ICD-10-CM | POA: Diagnosis not present

## 2021-02-20 DIAGNOSIS — L299 Pruritus, unspecified: Secondary | ICD-10-CM | POA: Diagnosis not present

## 2021-02-20 DIAGNOSIS — N186 End stage renal disease: Secondary | ICD-10-CM | POA: Diagnosis not present

## 2021-02-23 DIAGNOSIS — T8249XA Other complication of vascular dialysis catheter, initial encounter: Secondary | ICD-10-CM | POA: Diagnosis not present

## 2021-02-23 DIAGNOSIS — N2581 Secondary hyperparathyroidism of renal origin: Secondary | ICD-10-CM | POA: Diagnosis not present

## 2021-02-23 DIAGNOSIS — L299 Pruritus, unspecified: Secondary | ICD-10-CM | POA: Diagnosis not present

## 2021-02-23 DIAGNOSIS — D689 Coagulation defect, unspecified: Secondary | ICD-10-CM | POA: Diagnosis not present

## 2021-02-23 DIAGNOSIS — Z992 Dependence on renal dialysis: Secondary | ICD-10-CM | POA: Diagnosis not present

## 2021-02-23 DIAGNOSIS — T8249XS Other complication of vascular dialysis catheter, sequela: Secondary | ICD-10-CM | POA: Diagnosis not present

## 2021-02-23 DIAGNOSIS — N186 End stage renal disease: Secondary | ICD-10-CM | POA: Diagnosis not present

## 2021-02-24 ENCOUNTER — Other Ambulatory Visit: Payer: Self-pay

## 2021-02-24 ENCOUNTER — Encounter (HOSPITAL_COMMUNITY): Payer: Self-pay

## 2021-02-24 ENCOUNTER — Ambulatory Visit (INDEPENDENT_AMBULATORY_CARE_PROVIDER_SITE_OTHER): Payer: Medicare Other | Admitting: Vascular Surgery

## 2021-02-24 ENCOUNTER — Encounter (HOSPITAL_COMMUNITY): Payer: Self-pay | Admitting: Vascular Surgery

## 2021-02-24 ENCOUNTER — Encounter: Payer: Self-pay | Admitting: Vascular Surgery

## 2021-02-24 ENCOUNTER — Ambulatory Visit (HOSPITAL_COMMUNITY)
Admission: RE | Admit: 2021-02-24 | Discharge: 2021-02-24 | Disposition: A | Discharge status: Home / Self Care | Payer: Medicare Other | Source: Ambulatory Visit | Attending: Vascular Surgery | Admitting: Vascular Surgery

## 2021-02-24 ENCOUNTER — Ambulatory Visit (INDEPENDENT_AMBULATORY_CARE_PROVIDER_SITE_OTHER)
Admission: RE | Admit: 2021-02-24 | Discharge: 2021-02-24 | Disposition: A | Discharge status: Home / Self Care | Payer: Medicare Other | Source: Ambulatory Visit | Attending: Vascular Surgery | Admitting: Vascular Surgery

## 2021-02-24 VITALS — BP 168/100 | HR 79 | Temp 97.7°F | Resp 14 | Ht 69.0 in | Wt 298.0 lb

## 2021-02-24 DIAGNOSIS — N186 End stage renal disease: Secondary | ICD-10-CM

## 2021-02-24 DIAGNOSIS — Z992 Dependence on renal dialysis: Secondary | ICD-10-CM | POA: Diagnosis not present

## 2021-02-24 NOTE — Anesthesia Preprocedure Evaluation (Addendum)
Anesthesia Evaluation  Patient identified by MRN, date of birth, ID band Patient awake    Reviewed: Allergy & Precautions, NPO status , Patient's Chart, lab work & pertinent test results  Airway Mallampati: III  TM Distance: >3 FB Neck ROM: Full    Dental  (+) Teeth Intact, Dental Advisory Given   Pulmonary shortness of breath,    Pulmonary exam normal breath sounds clear to auscultation       Cardiovascular hypertension, + Peripheral Vascular Disease and +CHF  Normal cardiovascular exam Rhythm:Regular Rate:Normal     Neuro/Psych PSYCHIATRIC DISORDERS Anxiety negative neurological ROS     GI/Hepatic negative GI ROS, Neg liver ROS,   Endo/Other  Morbid obesity  Renal/GU ESRF and DialysisRenal disease (MWF)     Musculoskeletal  (+) Arthritis ,   Abdominal   Peds  Hematology negative hematology ROS (+)   Anesthesia Other Findings Day of surgery medications reviewed with the patient.  Reproductive/Obstetrics                           Anesthesia Physical Anesthesia Plan  ASA: 3  Anesthesia Plan: General   Post-op Pain Management: Tylenol PO (pre-op)*   Induction: Intravenous  PONV Risk Score and Plan: 3 and Midazolam, Dexamethasone and Ondansetron  Airway Management Planned: Oral ETT  Additional Equipment:   Intra-op Plan:   Post-operative Plan:   Informed Consent: I have reviewed the patients History and Physical, chart, labs and discussed the procedure including the risks, benefits and alternatives for the proposed anesthesia with the patient or authorized representative who has indicated his/her understanding and acceptance.     Dental advisory given  Plan Discussed with: CRNA  Anesthesia Plan Comments: (PAT note written 02/24/2021 by Myra Gianotti, PA-C. )      Anesthesia Quick Evaluation

## 2021-02-24 NOTE — Progress Notes (Signed)
DUE TO COVID-19 ONLY ONE VISITOR IS ALLOWED TO COME WITH YOU AND STAY IN THE WAITING ROOM ONLY DURING PRE OP AND PROCEDURE DAY OF SURGERY.   PCP - Dr Glendale Chard Cardiologist - n/a  Chest x-ray - n/a EKG - n/a Stress Test - n/a ECHO - 04/24/13 Cardiac Cath - n/a  ICD Pacemaker/Loop - n/a  Sleep Study -  n/a CPAP - none  Anesthesia review: Yes  STOP now taking any Aspirin (unless otherwise instructed by your surgeon), Aleve, Naproxen, Ibuprofen, Motrin, Advil, Goody's, BC's, all herbal medications, fish oil, and all vitamins.   Coronavirus Screening Covid test n/a Ambulatory Surgery  Do you have any of the following symptoms:  Cough yes/no: No Fever (>100.32F)  yes/no: No Runny nose yes/no: No Sore throat yes/no: No Difficulty breathing/shortness of breath  yes/no: No  Have you traveled in the last 14 days and where? yes/no: No  Patient verbalized understanding of instructions that were given via phone.

## 2021-02-24 NOTE — Progress Notes (Signed)
Patient name: [name] MRN: [id number] DOB: 1975/08/22 Sex: female  REASON FOR VISIT: Discuss new dialysis access  HPI: [name] is a 46 y.o. female with hx ESRD on HD M/W/F, HTN, HLD, morbid obesity that presents to discuss new permanent hemodialysis access.  She has a complex history of previous AV fistulas including a right brachiocephalic in 2706, left brachiocephalic in 2376, thrombectomy of left brachial artery with a bovine patch and ligation of the left brachiocephalic fistula for a thromboembolic event, right basilic, and right upper extremity AV graft with multiple revisions.  She has most recently been using a right arm AV graft that quit working several weeks ago.  She has undergone intervention at [facility] to attempt thrombectomy and angioplasty of failed right arm graft and this failed after several days.  She had a right IJ catheter in place.  Past Medical History:  Diagnosis Date   Anemia    Anxiety    pt not aware of this   Arthritis    Breast mass 09/01/2016   Targeted ultrasound is performed, showing a mass in the left breast at 11 o'clock, 1 cm from the nipple measuring 6.4 by 5.0 cm. The mass is heterogeneous and may contain macroscopic fat.   IMPRESSION: The mass in the left breast is probably benign, possibly a Hamartoma.  RECOMMENDATION: Recommend six-month follow-up mammogram of the probably benign left breast mass. If necessary, an ultrasound co   CHF (congestive heart failure) ([place])    hosp. 2011   Chronic kidney disease    not on dialysis at this time   Dilated cardiomyopathy ([place])    Encounter for blood transfusion    Family history of adverse reaction to anesthesia     " Aunt has a tracheostomy and kept coughing after colonoscopy "   Foot fracture    Gout    Heart murmur    as a child   History of blood transfusion    History of pneumonia    Hyperlipidemia    Hypertension    Menorrhagia    Morbid obesity ([place])    Pneumonia     hosp. 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   Thyroid disease     Past Surgical History:  Procedure Laterality Date   ANGIOPLASTY  07/28/2015   Procedure: ANGIOPLASTY OF RIGHT BRACHIAL VEIN;  Surgeon: Conrad Dane, MD;  Location: [place];  Service: Vascular;;   AV FISTULA PLACEMENT  08/04/2011   Procedure: ARTERIOVENOUS (AV) FISTULA CREATION;  Surgeon: Elam Dutch, MD;  Location: [place];  Service: Vascular;  Laterality: Right;  Creation right brachiocephalic arteriovenous fistula   AV FISTULA PLACEMENT Left 02/05/2013   Procedure: ARTERIOVENOUS FISTULA CREATION LEFT ARM;  Surgeon: Conrad Redwater, MD;  Location: [place];  Service: Vascular;  Laterality: Left;   AV FISTULA PLACEMENT Right 06/04/2013   Procedure: ARTERIOVENOUS (AV) FISTULA CREATION;  Surgeon: Conrad Kenton Vale, MD;  Location: [place];  Service: Vascular;  Laterality: Right;   AV FISTULA PLACEMENT Right 04/15/2014   Procedure: INSERTION OF ARTERIOVENOUS (AV) GORE-TEX GRAFT ARM;  Surgeon: Conrad Creston, MD;  Location: [place];  Service: Vascular;  Laterality: Right;   [place] Right 08/13/2013   Procedure: Second Stage Brachial Vein Transposition;  Surgeon:  Conrad Buffalo, MD;  Location: [place];  Service: Vascular;  Laterality: Right;   CARDIAC SURGERY  1979   repair of hole in heart   INCISION AND DRAINAGE OF WOUND Right 07/28/2015   Procedure: IRRIGATION AND DEBRIDEMENT RIGHT HAND  WOUND;  Surgeon: Dayna Barker, MD;  Location: [place];  Service: Plastics;  Laterality: Right;   LIGATION OF ARTERIOVENOUS  FISTULA Left 03/05/2013   Procedure: LIGATION OF ARTERIOVENOUS  FISTULA;  Surgeon: Conrad Milburn, MD;  Location: [place];  Service: Vascular;  Laterality: Left;   PATCH ANGIOPLASTY Left 03/05/2013   Procedure: PATCH ANGIOPLASTY;  Surgeon: Conrad [place], MD;  Location:  [place];  Service: Vascular;  Laterality: Left;   PERIPHERAL VASCULAR CATHETERIZATION Right 10/31/2014   Procedure: Fistulagram;  Surgeon: Conrad [place], MD;  Location: [place] [facility];  Service: Cardiovascular;  Laterality: Right;   PERIPHERAL VASCULAR CATHETERIZATION Right 10/31/2014   Procedure: Peripheral Vascular Balloon Angioplasty;  Surgeon: Conrad Blackwell, MD;  Location: [place] [facility];  Service: Cardiovascular;  Laterality: Right;  pta venous rt arm   PERIPHERAL VASCULAR CATHETERIZATION N/A 03/13/2015   Procedure: A/V Shuntogram;  Surgeon: Conrad Marion, MD;  Location: [place] [facility];  Service: Cardiovascular;  Laterality: N/A;   PERIPHERAL VASCULAR CATHETERIZATION Right 03/13/2015   Procedure: Peripheral Vascular Balloon Angioplasty;  Surgeon: Conrad Eldorado, MD;  Location: [place] [facility];  Service: Cardiovascular;  Laterality: Right;  Shunt   PERIPHERAL VASCULAR CATHETERIZATION Right 01/19/2016   Procedure: A/V Shuntogram;  Surgeon: Angelia Mould, MD;  Location: [place] [facility];  Service: Cardiovascular;  Laterality: Right;   REVISION OF ARTERIOVENOUS GORETEX GRAFT Right 10/27/2015   Procedure: THROMBECTOMY AND REVISION OF RIGHT UPPER ARM  ARTERIOVENOUS GORETEX GRAFT USING 6MM X 10CM GORE-TEX GRAFT;  Surgeon: Elam Dutch, MD;  Location: [place];  Service: Vascular;  Laterality: Right;   REVISION OF ARTERIOVENOUS GORETEX GRAFT Right 01/30/2016   Procedure: REVISION OF RIGHT UPPER ARM ARTERIOVENOUS USING GORETEX GRAFT;  Surgeon: Angelia Mould, MD;  Location: [place];  Service: Vascular;  Laterality: Right;   THROMBECTOMY W/ EMBOLECTOMY Left 03/05/2013   Procedure: THROMBECTOMY OF LEFT BRACHIAL ARTERY;  Surgeon: Conrad Gotebo, MD;  Location: [place];  Service: Vascular;  Laterality: Left;   VENOGRAM Bilateral 03/25/2014   Procedure: VENOGRAM bilateral;  Surgeon: Conrad Honaker, MD;  Location: [facility] C[facility];  Service: Cardiovascular;  Laterality: Bilateral;    Family History   Problem Relation Age of Onset   Diabetes Mother    Hyperlipidemia Mother    Hypertension Mother    Kidney disease Mother    Anesthesia problems Neg Hx    Hypotension Neg Hx    Malignant hyperthermia Neg Hx    Pseudochol deficiency Neg Hx     SOCIAL HISTORY: Social History   Tobacco Use   Smoking status: Never   Smokeless tobacco: Never  Substance Use Topics   Alcohol use: No    Alcohol/week: 0.0 standard drinks    Allergies  Allergen Reactions   Pollen Extract     Other reaction(s): Unknown   Oxycodone-Acetaminophen     Pt states this makes her itch, but she take it with a benadryl to help    Current Outpatient Medications  Medication Sig Dispense Refill   furosemide (LASIX) 80 MG tablet Take by mouth. (Patient not taking: Reported on 12/09/2020)     megestrol (MEGACE) 20 MG tablet Take 2 tablets (40 mg  total) by mouth daily. 60 tablet 11   PROAIR HFA 108 (90 Base) MCG/ACT inhaler Inhale 1-2 puffs into the lungs 3 (three) times daily as needed for wheezing or shortness of breath. For wheezing.  2   No current facility-administered medications for this visit.    REVIEW OF SYSTEMS:  [X]  denotes positive finding, [ ]  denotes negative finding Cardiac  Comments:  Chest pain or chest pressure:    Shortness of breath upon exertion:    Short of breath when lying flat:    Irregular heart rhythm:        Vascular    Pain in calf, thigh, or hip brought on by ambulation:    Pain in feet at night that wakes you up from your sleep:     Blood clot in your veins:    Leg swelling:         Pulmonary    Oxygen at home:    Productive cough:     Wheezing:         Neurologic    Sudden weakness in arms or legs:     Sudden numbness in arms or legs:     Sudden onset of difficulty speaking or slurred speech:    Temporary loss of vision in one eye:     Problems with dizziness:         Gastrointestinal    Blood in stool:     Vomited blood:         Genitourinary    Burning  when urinating:     Blood in urine:        Psychiatric    Major depression:         Hematologic    Bleeding problems:    Problems with blood clotting too easily:        Skin    Rashes or ulcers:        Constitutional    Fever or chills:      PHYSICAL EXAM: There were no vitals filed for this visit.  GENERAL: The patient is a well-nourished female, in no acute distress. The vital signs are documented above. CARDIAC: There is a regular rate and rhythm.  VASCULAR:  Bilateral radial brachial pulses palpable Right upper arm AVG no thrill RIJ tunneled dialysis catheter PULMONARY: No respiratory distress. ABDOMEN: Soft and non-tender. MUSCULOSKELETAL: There are no major deformities or cyanosis. NEUROLOGIC: No focal weakness or paresthesias are detected. SKIN: There are no ulcers or rashes noted. PSYCHIATRIC: The patient has a normal affect.  DATA:   Vein mapping shows usable basilic vein in the left arm  Assessment/Plan:  46 year old female with end-stage renal disease on hemodialysis Monday Wednesday Friday that presents with multiple failed accesses in both upper extremities most recently using a right upper arm AV graft that failed several weeks ago.  She has already undergone percutaenous intervention on failed graft with thrombectomy and this failed again.  In review of vein mapping today she does appear to have a possible basilic vein in the left arm and I recommended a left arm AV fistula versus AV graft.  The AV graft in the right arm was nearly 47 years old and she already underwent attempted intervention percutaneously with [facility] that failed after several days.  I think we should evaluate for new access as discussed with her.  Risk benefits discussed.  We will get her scheduled as soon as possible.   Marty Heck, MD [facility] Office:  [phone] ° ° ° ° ° °

## 2021-02-24 NOTE — Progress Notes (Signed)
Anesthesia Chart Review: [name]  Case: [id number] Date/Time: 02/25/21 1132   Procedure: LEFT ARM ARTERIOVENOUS (AV) FISTULA VERSUS ARTERIOVENOUS GRAFT CREATION (Left)   Anesthesia type: Choice   Pre-op diagnosis: ESRD   Location: [facility] OR ROOM 02 / [facility]   Surgeons: Marty Heck, MD       DISCUSSION: Patient is a 46 year old female scheduled for the above procedure. She has a RUE AVGG that is no longer working and failed attempt at thrombectomy and angioplasty at [facility]. She has a right IJ TDC.   History includes never smoker, HTN, HLD,  ESRD (on HD since 2020; HD MWF), dilated cardiomyopathy (diagnosed 02/2018 after presenting with volume overload, BP 203/154 off medications; in setting of hypertensive emergency off medication; evaluated by Lauree Chandler, MD, CM felt "most likely secondary to long- standing hypertension"; LVEF 45-50%, no RWMA 07/02/34), chronic systolic CHF, childhood murmur, syncope (01/2013 in setting of HGB 3.7 and AKI, s/p 5 units PRBC), dyspnea ("seldom"), left breast mass (possible hamartoma on 09/01/16 [place], stable on f/u imaging), secondary hyperparathyroidism, obesity.  Patient is on dialysis and needs new permanent access. Last LVEF 45-50% with no regional wall motion abnormalities in 2015. She has been on HD since 2020. She is a same day work-up, so anesthesia team to evaluate on the day of surgery.   VS:  BP Readings from Last 3 Encounters:  02/24/21 (!) 168/100  12/09/20 (!) 165/94  10/21/20 133/76   Pulse Readings from Last 3 Encounters:  02/24/21 79  12/09/20 77  10/21/20 70     PROVIDERS: Glendale Chard, MD is PCP    LABS: For day of surgery. At [facility] 10.4/29.2 on 02/20/21.    EKG: Last EKG noted is > 1 year ago.  EKG 03/01/19 Per [facility] Narrative: - ABNORMAL ECG -  Sinus rhythm  normal P axis, V-rate 60-99  Probable left atrial enlargement  P >83mS, <-0.15mV V1  LVH with secondary repolarization abnormality   multi-LVH criteria, abnrm ST-T  Borderline prolonged QT interval  QTc >440mS  No previous ECG available for comparison   Last available EKG tracing: 03/13/15: NSR with sinus arrhythmia, LVH   CV: Echo 04/24/13: Study Conclusions  - Left ventricle: The cavity size was mildly dilated. There    was severe concentric hypertrophy. Systolic function was    mildly reduced. The estimated ejection fraction was in the    range of 45% to 50%. Wall motion was normal; there were no    regional wall motion abnormalities. Features are    consistent with a pseudonormal left ventricular filling    pattern, with concomitant abnormal relaxation and    increased filling pressure (grade 2 diastolic    dysfunction). Doppler parameters are consistent with    elevated ventricular end-diastolic filling pressure.  - Aortic valve: Trileaflet; normal thickness leaflets. No    regurgitation.  - Aortic root: The aortic root was normal in size.  - Mitral valve: Mitral annular calcification    predominantlyposterior. Mild regurgitation.  - Left atrium: The atrium was moderately dilated.  - Right ventricle: Systolic function was normal.  - Tricuspid valve: No regurgitation.  - Pulmonary arteries: Systolic pressure was within the    normal range.  - Inferior vena cava: The vessel was normal in size.  - Pericardium, extracardiac: There was no pericardial    effusion.  - Comparison echo 02/16/08: Markedly dilated LV, LVEF ~ 30%, severe diffuse LV hypokinesis, severe concentric LVH, moderate diastolic dysfunction, LA  moderately dilated, normal RVSF   Past Medical History:  Diagnosis Date   Anemia    Anxiety    pt not aware of this   Arthritis    Breast mass 09/01/2016   Targeted ultrasound is performed, showing a mass in the left breast at 11 o'clock, 1 cm from the nipple measuring 6.4 by 5.0 cm. The mass is heterogeneous and may contain macroscopic fat.   IMPRESSION: The mass in the left breast is probably  benign, possibly a Hamartoma.  RECOMMENDATION: Recommend six-month follow-up mammogram of the probably benign left breast mass. If necessary, an ultrasound co   CHF (congestive heart failure) ([place])    hosp. 2011   Chronic kidney disease    not on dialysis at this time   Dilated cardiomyopathy ([place])    Encounter for blood transfusion    Family history of adverse reaction to anesthesia     " Aunt has a tracheostomy and kept coughing after colonoscopy "   Foot fracture    Gout    Heart murmur    as a child   History of blood transfusion    History of pneumonia    Hyperlipidemia    Hypertension    Menorrhagia    Morbid obesity ([place])    Pneumonia    hosp. 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   Thyroid disease     Past Surgical History:  Procedure Laterality Date   ANGIOPLASTY  07/28/2015   Procedure: ANGIOPLASTY OF RIGHT BRACHIAL VEIN;  Surgeon: Conrad [place]ant[place], MD;  Location: [place];  Service: Vascular;;   AV FISTULA PLACEMENT  08/04/2011   Procedure: ARTERIOVENOUS (AV) FISTULA CREATION;  Surgeon: Elam Dutch, MD;  Location: [place];  Service: Vascular;  Laterality: Right;  Creation right brachiocephalic arteriovenous fistula   AV FISTULA PLACEMENT Left 02/05/2013   Procedure: ARTERIOVENOUS FISTULA CREATION LEFT ARM;  Surgeon: Conrad [place], MD;  Location: [place];  Service: Vascular;  Laterality: Left;   AV FISTULA PLACEMENT Right 06/04/2013   Procedure: ARTERIOVENOUS (AV) FISTULA CREATION;  Surgeon: Conrad Loretto, MD;  Location: [place];  Service: Vascular;  Laterality: Right;   AV FISTULA PLACEMENT Right 04/15/2014   Procedure: INSERTION OF ARTERIOVENOUS (AV) GORE-TEX GRAFT ARM;  Surgeon: Conrad [place], MD;  Location: [place];  Service: Vascular;  Laterality: Right;   [place] Right 08/13/2013    Procedure: Second Stage Brachial Vein Transposition;  Surgeon: Conrad Guthrie, MD;  Location: [place];  Service: Vascular;  Laterality: Right;   CARDIAC SURGERY  1979   repair of hole in heart   INCISION AND DRAINAGE OF WOUND Right 07/28/2015   Procedure: IRRIGATION AND DEBRIDEMENT RIGHT HAND  WOUND;  Surgeon: Dayna Barker, MD;  Location: [place];  Service: Plastics;  Laterality: Right;   LIGATION OF ARTERIOVENOUS  FISTULA Left 03/05/2013   Procedure: LIGATION OF ARTERIOVENOUS  FISTULA;  Surgeon: Conrad La Coma, MD;  Location: [place];  Service: Vascular;  Laterality: Left;   PATCH ANGIOPLASTY Left 03/05/2013   Procedure: PATCH ANGIOPLASTY;  Surgeon: Conrad Gambrills, MD;  Location: [place];  Service: Vascular;  Laterality: Left;   PERIPHERAL VASCULAR CATHETERIZATION Right 10/31/2014   Procedure: Fistulagram;  Surgeon: Conrad [place], MD;  Location: [place] CV [facility];  Service: Cardiovascular;  Laterality: Right;   PERIPHERAL VASCULAR CATHETERIZATION Right 10/31/2014   Procedure: Peripheral Vascular Balloon Angioplasty;  Surgeon: Conrad [place], MD;  Location: [place] [facility];  Service: Cardiovascular;  Laterality: Right;  pta venous rt arm   PERIPHERAL VASCULAR CATHETERIZATION N/A 03/13/2015   Procedure: A/V Shuntogram;  Surgeon: Conrad [place], MD;  Location: [place] [facility];  Service: Cardiovascular;  Laterality: N/A;   PERIPHERAL VASCULAR CATHETERIZATION Right 03/13/2015   Procedure: Peripheral Vascular Balloon Angioplasty;  Surgeon: Conrad [place], MD;  Location: [place] [facility];  Service: Cardiovascular;  Laterality: Right;  Shunt   PERIPHERAL VASCULAR CATHETERIZATION Right 01/19/2016   Procedure: A/V Shuntogram;  Surgeon: Angelia Mould, MD;  Location: [place] [facility];  Service: Cardiovascular;  Laterality: Right;   REVISION OF ARTERIOVENOUS GORETEX GRAFT Right 10/27/2015   Procedure: THROMBECTOMY AND REVISION OF RIGHT UPPER ARM  ARTERIOVENOUS GORETEX GRAFT USING 6MM X 10CM GORE-TEX GRAFT;  Surgeon:  Elam Dutch, MD;  Location: [place];  Service: Vascular;  Laterality: Right;   REVISION OF ARTERIOVENOUS GORETEX GRAFT Right 01/30/2016   Procedure: REVISION OF RIGHT UPPER ARM ARTERIOVENOUS USING GORETEX GRAFT;  Surgeon: Angelia Mould, MD;  Location: [place];  Service: Vascular;  Laterality: Right;   THROMBECTOMY W/ EMBOLECTOMY Left 03/05/2013   Procedure: THROMBECTOMY OF LEFT BRACHIAL ARTERY;  Surgeon: Conrad Victor, MD;  Location: [place];  Service: Vascular;  Laterality: Left;   VENOGRAM Bilateral 03/25/2014   Procedure: VENOGRAM bilateral;  Surgeon: Conrad [place], MD;  Location: [facility] C[facility] LAB;  Service: Cardiovascular;  Laterality: Bilateral;    MEDICATIONS: No current facility-administered medications for this encounter.    furosemide (LASIX) 80 MG tablet   megestrol (MEGACE) 20 MG tablet   PROAIR HFA 108 (90 Base) MCG/ACT inhaler    Myra Gianotti, PA-C Surgical Short Stay/Anesthesiology [facility] Phone [phone] [facility] Phone [phone] 02/24/2021 2:15 PM

## 2021-02-25 ENCOUNTER — Other Ambulatory Visit: Payer: Self-pay

## 2021-02-25 ENCOUNTER — Ambulatory Visit (HOSPITAL_COMMUNITY)
Admission: RE | Admit: 2021-02-25 | Discharge: 2021-02-25 | Disposition: A | Discharge status: Home / Self Care | Payer: Medicare Other | Attending: Vascular Surgery | Admitting: Vascular Surgery

## 2021-02-25 ENCOUNTER — Ambulatory Visit (HOSPITAL_BASED_OUTPATIENT_CLINIC_OR_DEPARTMENT_OTHER): Payer: Medicare Other | Admitting: Vascular Surgery

## 2021-02-25 ENCOUNTER — Ambulatory Visit (HOSPITAL_COMMUNITY): Payer: Medicare Other | Admitting: Vascular Surgery

## 2021-02-25 ENCOUNTER — Encounter (HOSPITAL_COMMUNITY)
Admission: RE | Disposition: A | Discharge status: Home / Self Care | Payer: Self-pay | Source: Home / Self Care | Attending: Vascular Surgery

## 2021-02-25 ENCOUNTER — Encounter (HOSPITAL_COMMUNITY): Payer: Self-pay | Admitting: Vascular Surgery

## 2021-02-25 DIAGNOSIS — I420 Dilated cardiomyopathy: Secondary | ICD-10-CM | POA: Diagnosis not present

## 2021-02-25 DIAGNOSIS — I739 Peripheral vascular disease, unspecified: Secondary | ICD-10-CM | POA: Insufficient documentation

## 2021-02-25 DIAGNOSIS — E785 Hyperlipidemia, unspecified: Secondary | ICD-10-CM | POA: Insufficient documentation

## 2021-02-25 DIAGNOSIS — N2581 Secondary hyperparathyroidism of renal origin: Secondary | ICD-10-CM | POA: Diagnosis not present

## 2021-02-25 DIAGNOSIS — N186 End stage renal disease: Secondary | ICD-10-CM

## 2021-02-25 DIAGNOSIS — N185 Chronic kidney disease, stage 5: Secondary | ICD-10-CM | POA: Diagnosis not present

## 2021-02-25 DIAGNOSIS — I5022 Chronic systolic (congestive) heart failure: Secondary | ICD-10-CM | POA: Diagnosis not present

## 2021-02-25 DIAGNOSIS — I132 Hypertensive heart and chronic kidney disease with heart failure and with stage 5 chronic kidney disease, or end stage renal disease: Secondary | ICD-10-CM

## 2021-02-25 DIAGNOSIS — M1990 Unspecified osteoarthritis, unspecified site: Secondary | ICD-10-CM | POA: Insufficient documentation

## 2021-02-25 DIAGNOSIS — Z6841 Body Mass Index (BMI) 40.0 and over, adult: Secondary | ICD-10-CM | POA: Diagnosis not present

## 2021-02-25 DIAGNOSIS — Z992 Dependence on renal dialysis: Secondary | ICD-10-CM | POA: Insufficient documentation

## 2021-02-25 DIAGNOSIS — I509 Heart failure, unspecified: Secondary | ICD-10-CM

## 2021-02-25 HISTORY — PX: AV FISTULA PLACEMENT: SHX1204

## 2021-02-25 LAB — POCT I-STAT, CHEM 8
BUN: 39 mg/dL — ABNORMAL HIGH (ref 6–20)
Calcium, Ion: 1.21 mmol/L (ref 1.15–1.40)
Chloride: 100 mmol/L (ref 98–111)
Creatinine, Ser: 11.9 mg/dL — ABNORMAL HIGH (ref 0.44–1.00)
Glucose, Bld: 80 mg/dL (ref 70–99)
HCT: 33 % — ABNORMAL LOW (ref 36.0–46.0)
Hemoglobin: 11.2 g/dL — ABNORMAL LOW (ref 12.0–15.0)
Potassium: 3.8 mmol/L (ref 3.5–5.1)
Sodium: 138 mmol/L (ref 135–145)
TCO2: 27 mmol/L (ref 22–32)

## 2021-02-25 LAB — POCT PREGNANCY, URINE: Preg Test, Ur: NEGATIVE

## 2021-02-25 SURGERY — ARTERIOVENOUS (AV) FISTULA CREATION
Anesthesia: General | Site: Arm Upper | Laterality: Left

## 2021-02-25 MED ORDER — ROCURONIUM BROMIDE 10 MG/ML (PF) SYRINGE
PREFILLED_SYRINGE | INTRAVENOUS | Status: DC | PRN
  Administered 2021-02-25: 50 mg via INTRAVENOUS

## 2021-02-25 MED ORDER — SUCCINYLCHOLINE CHLORIDE 200 MG/10ML IV SOSY
PREFILLED_SYRINGE | INTRAVENOUS | Status: AC
  Filled 2021-02-25: qty 10

## 2021-02-25 MED ORDER — FENTANYL CITRATE (PF) 100 MCG/2ML IJ SOLN
INTRAMUSCULAR | Status: AC
  Filled 2021-02-25: qty 2

## 2021-02-25 MED ORDER — HEPARIN SODIUM (PORCINE) 1000 UNIT/ML IJ SOLN
INTRAMUSCULAR | Status: DC | PRN
  Administered 2021-02-25: 5000 [IU] via INTRAVENOUS

## 2021-02-25 MED ORDER — ONDANSETRON HCL 4 MG/2ML IJ SOLN: 4.0000 mg | Freq: Once | INTRAMUSCULAR | Status: DC | PRN

## 2021-02-25 MED ORDER — PROPOFOL 10 MG/ML IV BOLUS
INTRAVENOUS | Status: AC
  Filled 2021-02-25: qty 20

## 2021-02-25 MED ORDER — ACETAMINOPHEN 500 MG PO TABS
1000.0000 mg | ORAL_TABLET | Freq: Once | ORAL | Status: AC
  Administered 2021-02-25: 1000 mg via ORAL
  Filled 2021-02-25: qty 2

## 2021-02-25 MED ORDER — DEXAMETHASONE SODIUM PHOSPHATE 10 MG/ML IJ SOLN
INTRAMUSCULAR | Status: DC | PRN
  Administered 2021-02-25: 5 mg via INTRAVENOUS

## 2021-02-25 MED ORDER — ONDANSETRON HCL 4 MG/2ML IJ SOLN
INTRAMUSCULAR | Status: DC | PRN
  Administered 2021-02-25: 4 mg via INTRAVENOUS

## 2021-02-25 MED ORDER — LIDOCAINE 2% (20 MG/ML) 5 ML SYRINGE
INTRAMUSCULAR | Status: DC | PRN
Start: 2021-02-25 — End: 2021-02-25
  Administered 2021-02-25: 80 mg via INTRAVENOUS

## 2021-02-25 MED ORDER — HYDROCODONE-ACETAMINOPHEN 5-325 MG PO TABS: 1.0000 | ORAL_TABLET | ORAL | 0 refills | Status: DC | PRN

## 2021-02-25 MED ORDER — CEFAZOLIN IN SODIUM CHLORIDE 3-0.9 GM/100ML-% IV SOLN
3.0000 g | INTRAVENOUS | Status: AC
  Administered 2021-02-25: 3 g via INTRAVENOUS
  Filled 2021-02-25: qty 100

## 2021-02-25 MED ORDER — CHLORHEXIDINE GLUCONATE 0.12 % MT SOLN
OROMUCOSAL | Status: AC
  Administered 2021-02-25: 15 mL
  Filled 2021-02-25: qty 15

## 2021-02-25 MED ORDER — CHLORHEXIDINE GLUCONATE 4 % EX LIQD: 60.0000 mL | Freq: Once | CUTANEOUS | Status: DC

## 2021-02-25 MED ORDER — SUGAMMADEX SODIUM 500 MG/5ML IV SOLN
INTRAVENOUS | Status: AC
  Filled 2021-02-25: qty 5

## 2021-02-25 MED ORDER — FENTANYL CITRATE (PF) 250 MCG/5ML IJ SOLN
INTRAMUSCULAR | Status: AC
  Filled 2021-02-25: qty 5

## 2021-02-25 MED ORDER — FENTANYL CITRATE (PF) 250 MCG/5ML IJ SOLN
INTRAMUSCULAR | Status: DC | PRN
  Administered 2021-02-25 (×3): 50 ug via INTRAVENOUS

## 2021-02-25 MED ORDER — HEPARIN 6000 UNIT IRRIGATION SOLUTION
Status: DC | PRN
  Administered 2021-02-25: 1

## 2021-02-25 MED ORDER — SUGAMMADEX SODIUM 500 MG/5ML IV SOLN
INTRAVENOUS | Status: DC | PRN
  Administered 2021-02-25: 300 mg via INTRAVENOUS

## 2021-02-25 MED ORDER — PROPOFOL 10 MG/ML IV BOLUS
INTRAVENOUS | Status: DC | PRN
  Administered 2021-02-25: 130 mg via INTRAVENOUS

## 2021-02-25 MED ORDER — LIDOCAINE 2% (20 MG/ML) 5 ML SYRINGE
INTRAMUSCULAR | Status: AC
  Filled 2021-02-25: qty 5

## 2021-02-25 MED ORDER — MIDAZOLAM HCL 2 MG/2ML IJ SOLN
INTRAMUSCULAR | Status: AC
  Filled 2021-02-25: qty 2

## 2021-02-25 MED ORDER — ONDANSETRON HCL 4 MG/2ML IJ SOLN
INTRAMUSCULAR | Status: AC
  Filled 2021-02-25: qty 2

## 2021-02-25 MED ORDER — FENTANYL CITRATE (PF) 100 MCG/2ML IJ SOLN: 25.0000 ug | INTRAMUSCULAR | Status: DC | PRN

## 2021-02-25 MED ORDER — DEXAMETHASONE SODIUM PHOSPHATE 10 MG/ML IJ SOLN
INTRAMUSCULAR | Status: AC
  Filled 2021-02-25: qty 1

## 2021-02-25 MED ORDER — 0.9 % SODIUM CHLORIDE (POUR BTL) OPTIME
TOPICAL | Status: DC | PRN
  Administered 2021-02-25: 1000 mL

## 2021-02-25 MED ORDER — MIDAZOLAM HCL 2 MG/2ML IJ SOLN
INTRAMUSCULAR | Status: DC | PRN
  Administered 2021-02-25: 1 mg via INTRAVENOUS

## 2021-02-25 MED ORDER — ROCURONIUM BROMIDE 10 MG/ML (PF) SYRINGE
PREFILLED_SYRINGE | INTRAVENOUS | Status: AC
  Filled 2021-02-25: qty 10

## 2021-02-25 MED ORDER — FENTANYL CITRATE (PF) 100 MCG/2ML IJ SOLN
25.0000 ug | INTRAMUSCULAR | Status: DC | PRN
  Administered 2021-02-25 (×4): 25 ug via INTRAVENOUS

## 2021-02-25 MED ORDER — SODIUM CHLORIDE 0.9 % IV SOLN: INTRAVENOUS | Status: DC

## 2021-02-25 SURGICAL SUPPLY — 39 items
ADH SKN CLS APL DERMABOND .7 (GAUZE/BANDAGES/DRESSINGS) ×1
AGENT HMST SPONGE THK3/8 (HEMOSTASIS)
ARMBAND PINK RESTRICT EXTREMIT (MISCELLANEOUS) ×6 IMPLANT
BAG COUNTER SPONGE SURGICOUNT (BAG) ×3 IMPLANT
BAG SPNG CNTER NS LX DISP (BAG) ×1
BLADE CLIPPER SURG (BLADE) ×3 IMPLANT
CANISTER SUCT 3000ML PPV (MISCELLANEOUS) ×3 IMPLANT
CLIP VESOCCLUDE MED 6/CT (CLIP) ×3 IMPLANT
CLIP VESOCCLUDE SM WIDE 6/CT (CLIP) ×3 IMPLANT
COVER PROBE W GEL 5X96 (DRAPES) ×3 IMPLANT
DECANTER SPIKE VIAL GLASS SM (MISCELLANEOUS) ×3 IMPLANT
DERMABOND ADVANCED (GAUZE/BANDAGES/DRESSINGS) ×1
DERMABOND ADVANCED .7 DNX12 (GAUZE/BANDAGES/DRESSINGS) ×2 IMPLANT
ELECT REM PT RETURN 9FT ADLT (ELECTROSURGICAL) ×2
ELECTRODE REM PT RTRN 9FT ADLT (ELECTROSURGICAL) ×2 IMPLANT
GLOVE SRG 8 PF TXTR STRL LF DI (GLOVE) ×2 IMPLANT
GLOVE SURG ENC MOIS LTX SZ7.5 (GLOVE) ×3 IMPLANT
GLOVE SURG UNDER POLY LF SZ8 (GLOVE) ×2
GOWN STRL REUS W/ TWL LRG LVL3 (GOWN DISPOSABLE) ×4 IMPLANT
GOWN STRL REUS W/ TWL XL LVL3 (GOWN DISPOSABLE) ×4 IMPLANT
GOWN STRL REUS W/TWL LRG LVL3 (GOWN DISPOSABLE) ×4
GOWN STRL REUS W/TWL XL LVL3 (GOWN DISPOSABLE) ×4
HEMOSTAT SPONGE AVITENE ULTRA (HEMOSTASIS) IMPLANT
KIT BASIN OR (CUSTOM PROCEDURE TRAY) ×3 IMPLANT
KIT TURNOVER KIT B (KITS) ×3 IMPLANT
NS IRRIG 1000ML POUR BTL (IV SOLUTION) ×3 IMPLANT
PACK CV ACCESS (CUSTOM PROCEDURE TRAY) ×3 IMPLANT
PAD ARMBOARD 7.5X6 YLW CONV (MISCELLANEOUS) ×6 IMPLANT
PENCIL BUTTON HOLSTER BLD 10FT (ELECTRODE) ×1 IMPLANT
SUT MNCRL AB 4-0 PS2 18 (SUTURE) ×3 IMPLANT
SUT PROLENE 6 0 BV (SUTURE) ×6 IMPLANT
SUT PROLENE 7 0 BV 1 (SUTURE) IMPLANT
SUT SILK 3 0 (SUTURE) ×4
SUT SILK 3-0 18XBRD TIE 12 (SUTURE) IMPLANT
SUT VIC AB 3-0 SH 27 (SUTURE) ×2
SUT VIC AB 3-0 SH 27X BRD (SUTURE) ×2 IMPLANT
TOWEL GREEN STERILE (TOWEL DISPOSABLE) ×3 IMPLANT
UNDERPAD 30X36 HEAVY ABSORB (UNDERPADS AND DIAPERS) ×3 IMPLANT
WATER STERILE IRR 1000ML POUR (IV SOLUTION) ×3 IMPLANT

## 2021-02-25 NOTE — Anesthesia Postprocedure Evaluation (Signed)
Anesthesia Post Note  Patient: [name]  Procedure(s) Performed: LEFT ARM ARTERIOVENOUS (AV) FISTULA CREATION (Left: Arm Upper)     Patient location during evaluation: PACU Anesthesia Type: General Level of consciousness: awake Pain management: pain level controlled Respiratory status: spontaneous breathing Cardiovascular status: stable Postop Assessment: no apparent nausea or vomiting Anesthetic complications: no   No notable events documented.  Last Vitals:  Vitals:   02/25/21 1500 02/25/21 1508  BP: (!) 155/94 (!) 151/95  Pulse: 73 69  Resp: 16 12  Temp:  36.6 C  SpO2: 96% 93%    Last Pain:  Vitals:   02/25/21 1439  PainSc: 0-No pain                 Naquisha Whitehair

## 2021-02-25 NOTE — H&P (Signed)
History and Physical Interval Note:  02/25/2021 12:07 PM  [name]  has presented today for surgery, with the diagnosis of ESRD.  The various methods of treatment have been discussed with the patient and family. After consideration of risks, benefits and other options for treatment, the patient has consented to  Procedure(s): LEFT ARM ARTERIOVENOUS (AV) FISTULA VERSUS ARTERIOVENOUS GRAFT CREATION (Left) as a surgical intervention.  The patient's history has been reviewed, patient examined, no change in status, stable for surgery.  I have reviewed the patient's chart and labs.  Questions were answered to the patient's satisfaction.    Left arm AVF vs graft  Marty Heck  Patient name: [name]     MRN: [id number]        DOB: 11-17-1975            Sex: female   REASON FOR VISIT: Discuss new dialysis access   HPI: [name] is a 46 y.o. female with hx ESRD on HD M/W/F, HTN, HLD, morbid obesity that presents to discuss new permanent hemodialysis access.  She has a complex history of previous AV fistulas including a right brachiocephalic in 6644, left brachiocephalic in 0347, thrombectomy of left brachial artery with a bovine patch and ligation of the left brachiocephalic fistula for a thromboembolic event, right basilic, and right upper extremity AV graft with multiple revisions.   She has most recently been using a right arm AV graft that quit working several weeks ago.  She has undergone intervention at [facility] to attempt thrombectomy and angioplasty of failed right arm graft and this failed after several days.  She had a right IJ catheter in place.       Past Medical History:  Diagnosis Date   Anemia     Anxiety      pt not aware of this   Arthritis     Breast mass 09/01/2016    Targeted ultrasound is performed, showing a mass in the left breast at 11 o'clock, 1 cm from the nipple measuring 6.4 by 5.0 cm. The mass is heterogeneous and may contain  macroscopic fat.   IMPRESSION: The mass in the left breast is probably benign, possibly a Hamartoma.  RECOMMENDATION: Recommend six-month follow-up mammogram of the probably benign left breast mass. If necessary, an ultrasound co   CHF (congestive heart failure) ([place])      hosp. 2011   Chronic kidney disease      not on dialysis at this time   Dilated cardiomyopathy ([place])     Encounter for blood transfusion     Family history of adverse reaction to anesthesia       " Aunt has a tracheostomy and kept coughing after colonoscopy "   Foot fracture     Gout     Heart murmur      as a child   History of blood transfusion     History of pneumonia     Hyperlipidemia     Hypertension     Menorrhagia     Morbid obesity ([place])     Pneumonia      hosp. 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[place]) 03/30/2013   Thyroid disease  Past Surgical History:  Procedure Laterality Date   ANGIOPLASTY   07/28/2015    Procedure: ANGIOPLASTY OF RIGHT BRACHIAL VEIN;  Surgeon: Conrad Petroleum, MD;  Location: [place];  Service: Vascular;;   AV FISTULA PLACEMENT   08/04/2011    Procedure: ARTERIOVENOUS (AV) FISTULA CREATION;  Surgeon: Elam Dutch, MD;  Location: [place];  Service: Vascular;  Laterality: Right;  Creation right brachiocephalic arteriovenous fistula   AV FISTULA PLACEMENT Left 02/05/2013    Procedure: ARTERIOVENOUS FISTULA CREATION LEFT ARM;  Surgeon: Conrad Oden, MD;  Location: [place];  Service: Vascular;  Laterality: Left;   AV FISTULA PLACEMENT Right 06/04/2013    Procedure: ARTERIOVENOUS (AV) FISTULA CREATION;  Surgeon: Conrad Spring, MD;  Location: [place];  Service: Vascular;  Laterality: Right;   AV FISTULA PLACEMENT Right 04/15/2014    Procedure: INSERTION OF ARTERIOVENOUS (AV) GORE-TEX GRAFT ARM;  Surgeon: Conrad Edie, MD;   Location: [place];  Service: Vascular;  Laterality: Right;   [place] Right 08/13/2013    Procedure: Second Stage Brachial Vein Transposition;  Surgeon: Conrad French Camp, MD;  Location: [place];  Service: Vascular;  Laterality: Right;   CARDIAC SURGERY   1979    repair of hole in heart   INCISION AND DRAINAGE OF WOUND Right 07/28/2015    Procedure: IRRIGATION AND DEBRIDEMENT RIGHT HAND  WOUND;  Surgeon: Dayna Barker, MD;  Location: [place];  Service: Plastics;  Laterality: Right;   LIGATION OF ARTERIOVENOUS  FISTULA Left 03/05/2013    Procedure: LIGATION OF ARTERIOVENOUS  FISTULA;  Surgeon: Conrad Littleton, MD;  Location: [place];  Service: Vascular;  Laterality: Left;   PATCH ANGIOPLASTY Left 03/05/2013    Procedure: PATCH ANGIOPLASTY;  Surgeon: Conrad Athens, MD;  Location: [place];  Service: Vascular;  Laterality: Left;   PERIPHERAL VASCULAR CATHETERIZATION Right 10/31/2014    Procedure: Fistulagram;  Surgeon: Conrad Parker, MD;  Location: [place] [facility];  Service: Cardiovascular;  Laterality: Right;   PERIPHERAL VASCULAR CATHETERIZATION Right 10/31/2014    Procedure: Peripheral Vascular Balloon Angioplasty;  Surgeon: Conrad Havana, MD;  Location: [place] [facility];  Service: Cardiovascular;  Laterality: Right;  pta venous rt arm   PERIPHERAL VASCULAR CATHETERIZATION N/A 03/13/2015    Procedure: A/V Shuntogram;  Surgeon: Conrad West Laurel, MD;  Location: [place] [facility];  Service: Cardiovascular;  Laterality: N/A;   PERIPHERAL VASCULAR CATHETERIZATION Right 03/13/2015    Procedure: Peripheral Vascular Balloon Angioplasty;  Surgeon: Conrad , MD;  Location: [place] [facility];  Service: Cardiovascular;  Laterality: Right;  Shunt   PERIPHERAL VASCULAR CATHETERIZATION Right 01/19/2016    Procedure: A/V Shuntogram;  Surgeon: Angelia Mould, MD;  Location: [place] [facility];  Service: Cardiovascular;  Laterality: Right;   REVISION OF ARTERIOVENOUS GORETEX GRAFT Right 10/27/2015    Procedure:  THROMBECTOMY AND REVISION OF RIGHT UPPER ARM  ARTERIOVENOUS GORETEX GRAFT USING 6MM X 10CM GORE-TEX GRAFT;  Surgeon: Elam Dutch, MD;  Location: [place];  Service: Vascular;  Laterality: Right;   REVISION OF ARTERIOVENOUS GORETEX GRAFT Right 01/30/2016    Procedure: REVISION OF RIGHT UPPER ARM ARTERIOVENOUS USING GORETEX GRAFT;  Surgeon: Angelia Mould, MD;  Location: [place];  Service: Vascular;  Laterality: Right;   THROMBECTOMY W/ EMBOLECTOMY Left 03/05/2013    Procedure: THROMBECTOMY OF LEFT BRACHIAL ARTERY;  Surgeon: Conrad , MD;  Location: [place];  Service: Vascular;  Laterality: Left;   VENOGRAM  Bilateral 03/25/2014    Procedure: VENOGRAM bilateral;  Surgeon: Conrad Ramona, MD;  Location: [facility];  Service: Cardiovascular;  Laterality: Bilateral;           Family History  Problem Relation Age of Onset   Diabetes Mother     Hyperlipidemia Mother     Hypertension Mother     Kidney disease Mother     Anesthesia problems Neg Hx     Hypotension Neg Hx     Malignant hyperthermia Neg Hx     Pseudochol deficiency Neg Hx        SOCIAL HISTORY: Social History         Tobacco Use   Smoking status: Never   Smokeless tobacco: Never  Substance Use Topics   Alcohol use: No      Alcohol/week: 0.0 standard drinks           Allergies  Allergen Reactions   Pollen Extract        Other reaction(s): Unknown   Oxycodone-Acetaminophen        Pt states this makes her itch, but she take it with a benadryl to help            Current Outpatient Medications  Medication Sig Dispense Refill   furosemide (LASIX) 80 MG tablet Take by mouth. (Patient not taking: Reported on 12/09/2020)       megestrol (MEGACE) 20 MG tablet Take 2 tablets (40 mg total) by mouth daily. 60 tablet 11   PROAIR HFA 108 (90 Base) MCG/ACT inhaler Inhale 1-2 puffs into the lungs 3 (three) times daily as needed for wheezing or shortness of breath. For wheezing.   2    No current facility-administered  medications for this visit.      REVIEW OF SYSTEMS:  [X]  denotes positive finding, [ ]  denotes negative finding Cardiac   Comments:  Chest pain or chest pressure:      Shortness of breath upon exertion:      Short of breath when lying flat:      Irregular heart rhythm:             Vascular      Pain in calf, thigh, or hip brought on by ambulation:      Pain in feet at night that wakes you up from your sleep:       Blood clot in your veins:      Leg swelling:              Pulmonary      Oxygen at home:      Productive cough:       Wheezing:              Neurologic      Sudden weakness in arms or legs:       Sudden numbness in arms or legs:       Sudden onset of difficulty speaking or slurred speech:      Temporary loss of vision in one eye:       Problems with dizziness:              Gastrointestinal      Blood in stool:       Vomited blood:              Genitourinary      Burning when urinating:       Blood in urine:  Psychiatric      Major depression:              Hematologic      Bleeding problems:      Problems with blood clotting too easily:             Skin      Rashes or ulcers:             Constitutional      Fever or chills:          PHYSICAL EXAM: There were no vitals filed for this visit.   GENERAL: The patient is a well-nourished female, in no acute distress. The vital signs are documented above. CARDIAC: There is a regular rate and rhythm.  VASCULAR:  Bilateral radial brachial pulses palpable Right upper arm AVG no thrill RIJ tunneled dialysis catheter PULMONARY: No respiratory distress. ABDOMEN: Soft and non-tender. MUSCULOSKELETAL: There are no major deformities or cyanosis. NEUROLOGIC: No focal weakness or paresthesias are detected. SKIN: There are no ulcers or rashes noted. PSYCHIATRIC: The patient has a normal affect.   DATA:    Vein mapping shows usable basilic vein in the left arm   Assessment/Plan:   46 year old  female with end-stage renal disease on hemodialysis Monday Wednesday Friday that presents with multiple failed accesses in both upper extremities most recently using a right upper arm AV graft that failed several weeks ago.  She has already undergone percutaenous intervention on failed graft with thrombectomy and this failed again.  In review of vein mapping today she does appear to have a possible basilic vein in the left arm and I recommended a left arm AV fistula versus AV graft.  The AV graft in the right arm was nearly 47 years old and she already underwent attempted intervention percutaneously with [facility] that failed after several days.  I think we should evaluate for new access as discussed with her.  Risk benefits discussed.  We will get her scheduled as soon as possible.     Marty Heck, MD [facility] Office: [phone]

## 2021-02-25 NOTE — Anesthesia Procedure Notes (Signed)
Procedure Name: Intubation Date/Time: 02/25/2021 12:37 PM Performed by: Harden Mo, CRNA Pre-anesthesia Checklist: Patient identified, Emergency Drugs available, Suction available and Patient being monitored Patient Re-evaluated:Patient Re-evaluated prior to induction Oxygen Delivery Method: Circle System Utilized Preoxygenation: Pre-oxygenation with 100% oxygen Induction Type: IV induction Ventilation: Mask ventilation without difficulty Laryngoscope Size: Miller and 2 Grade View: Grade I Tube type: Oral Tube size: 7.0 mm Number of attempts: 1 Airway Equipment and Method: Stylet and Oral airway Placement Confirmation: ETT inserted through vocal cords under direct vision, positive ETCO2 and breath sounds checked- equal and bilateral Secured at: 22 cm Tube secured with: Tape Dental Injury: Teeth and Oropharynx as per pre-operative assessment

## 2021-02-25 NOTE — Progress Notes (Signed)
Patient continues to wait for transportation at this time

## 2021-02-25 NOTE — Op Note (Signed)
OPERATIVE NOTE   PROCEDURE: left first stage basilic vein transposition (brachiobasilic arteriovenous fistula) placement  PRE-OPERATIVE DIAGNOSIS: ESRD  POST-OPERATIVE DIAGNOSIS: same  SURGEON: Marty Heck, MD  ASSISTANT(S): Roxy Horseman, PA  ANESTHESIA: general  ESTIMATED BLOOD LOSS: <25 mL  FINDING(S): 1.  Basilic vein: 3 mm, acceptable 2.  Brachial artery: 3.5 mm, atherosclerotic disease evident 3.  Venous outflow: palpable thrill  4.  Radial flow: palpable radial pulse  SPECIMEN(S):  none  INDICATIONS:   [name] is a 46 y.o. female who presents with end stage renal disease and many failed access procedures in bilateral upper extremities.  The patient is scheduled for left arteriovenous fistula versus arteriovenous graft.  The patient is aware the risks include but are not limited to: bleeding, infection, steal syndrome, nerve damage, ischemic monomelic neuropathy, failure to mature, and need for additional procedures.  The patient is aware of the risks of the procedure and elects to proceed forward.   DESCRIPTION: After full informed written consent was obtained from the patient, the patient was brought back to the operating room and placed supine upon the operating table.  Prior to induction, the patient received IV antibiotics.   After obtaining adequate anesthesia, the patient was then prepped and draped in the standard fashion for a left arm access procedure.  I turned my attention first to identifying the patient's basilic vein and brachial artery.    Using SonoSite guidance, the location of these vessels were marked out on the skin around the antecubital fossa.   I made a transverse incision at the level of the antecubitum and dissected through the subcutaneous tissue and fascia to gain exposure of the brachial artery.  This was noted to be 3.5 mm in diameter externally.  There was significant scar tissue here from previous surgery.  This was  dissected out proximally and distally and controlled with vessel loops .  I then dissected out the basilic vein.  This was noted to be 3 mm in diameter externally.  The distal segment of the vein was ligated with a  2-0 silk, and the vein was transected.  I then instilled the heparinized saline into the vein and clamped it.  At this point, I reset my exposure of the brachial artery.  The patient was given 5,000 units IV heparin.  I then placed the artery under tension proximally and distally.  I made an arteriotomy with a #11 blade, and then I extended the arteriotomy with a Potts scissor.  I injected heparinized saline proximal and distal to this arteriotomy.  The vein was then sewn to the artery in an end-to-side configuration with a running stitch of 6-0 Prolene.  Prior to completing this anastomosis, I allowed the vein and artery to backbleed.  There was no evidence of clot from any vessels.  I completed the anastomosis in the usual fashion and then released all vessel loops and clamps.    There was a palpable thrill in the venous outflow, and there was a palpable radial pulse.  At this point, I irrigated out the surgical wound.  There was no further active bleeding.  The subcutaneous tissue was reapproximated with a running stitch of 3-0 Vicryl.  The skin was then reapproximated with a running subcuticular stitch of 4-0 Monocryl.  The skin was then cleaned, dried, and reinforced with Dermabond.  The patient tolerated this procedure well.   COMPLICATIONS: None  CONDITION: Stable  Marty Heck MD [facility] Office: [place]   02/25/2021, 2:08 PM

## 2021-02-25 NOTE — Anesthesia Postprocedure Evaluation (Signed)
Anesthesia Post Note  Patient: [name]  Procedure(s) Performed: LEFT ARM ARTERIOVENOUS (AV) FISTULA CREATION (Left: Arm Upper)     Patient location during evaluation: PACU Anesthesia Type: General Level of consciousness: awake Pain management: pain level controlled Vital Signs Assessment: post-procedure vital signs reviewed and stable Respiratory status: spontaneous breathing Cardiovascular status: stable Postop Assessment: no apparent nausea or vomiting Anesthetic complications: no   No notable events documented.  Last Vitals:  Vitals:   02/25/21 1454 02/25/21 1500  BP: (!) 169/98 (!) 155/94  Pulse: 74 73  Resp: 16 16  Temp:    SpO2: 97% 96%    Last Pain:  Vitals:   02/25/21 1439  PainSc: 0-No pain                 Jonika Critz

## 2021-02-25 NOTE — Transfer of Care (Signed)
Immediate Anesthesia Transfer of Care Note  Patient: [name]  Procedure(s) Performed: LEFT ARM ARTERIOVENOUS (AV) FISTULA CREATION (Left: Arm Upper)  Patient Location: PACU  Anesthesia Type:General  Level of Consciousness: awake and drowsy  Airway & Oxygen Therapy: Patient Spontanous Breathing  Post-op Assessment: Report given to RN, Post -op Vital signs reviewed and stable and Patient moving all extremities X 4  Post vital signs: Reviewed and stable  Last Vitals:  Vitals Value Taken Time  BP 179/100 02/25/21 1408  Temp    Pulse 90 02/25/21 1409  Resp 15 02/25/21 1409  SpO2 98 % 02/25/21 1409  Vitals shown include unvalidated device data.  Last Pain:  Vitals:   02/25/21 0929  PainSc: 0-No pain         Complications: No notable events documented.

## 2021-02-26 ENCOUNTER — Ambulatory Visit: Payer: BC Managed Care – PPO | Admitting: Vascular Surgery

## 2021-02-26 ENCOUNTER — Encounter (HOSPITAL_COMMUNITY): Payer: Self-pay | Admitting: Vascular Surgery

## 2021-02-26 ENCOUNTER — Encounter (HOSPITAL_COMMUNITY): Payer: BC Managed Care – PPO

## 2021-02-26 ENCOUNTER — Other Ambulatory Visit (HOSPITAL_COMMUNITY): Payer: BC Managed Care – PPO

## 2021-02-27 DIAGNOSIS — D689 Coagulation defect, unspecified: Secondary | ICD-10-CM | POA: Diagnosis not present

## 2021-02-27 DIAGNOSIS — Z992 Dependence on renal dialysis: Secondary | ICD-10-CM | POA: Diagnosis not present

## 2021-02-27 DIAGNOSIS — T8249XA Other complication of vascular dialysis catheter, initial encounter: Secondary | ICD-10-CM | POA: Diagnosis not present

## 2021-02-27 DIAGNOSIS — N2581 Secondary hyperparathyroidism of renal origin: Secondary | ICD-10-CM | POA: Diagnosis not present

## 2021-02-27 DIAGNOSIS — T8249XS Other complication of vascular dialysis catheter, sequela: Secondary | ICD-10-CM | POA: Diagnosis not present

## 2021-02-27 DIAGNOSIS — L299 Pruritus, unspecified: Secondary | ICD-10-CM | POA: Diagnosis not present

## 2021-02-27 DIAGNOSIS — N186 End stage renal disease: Secondary | ICD-10-CM | POA: Diagnosis not present

## 2021-03-02 DIAGNOSIS — D689 Coagulation defect, unspecified: Secondary | ICD-10-CM | POA: Diagnosis not present

## 2021-03-02 DIAGNOSIS — T8249XS Other complication of vascular dialysis catheter, sequela: Secondary | ICD-10-CM | POA: Diagnosis not present

## 2021-03-02 DIAGNOSIS — Z992 Dependence on renal dialysis: Secondary | ICD-10-CM | POA: Diagnosis not present

## 2021-03-02 DIAGNOSIS — L299 Pruritus, unspecified: Secondary | ICD-10-CM | POA: Diagnosis not present

## 2021-03-02 DIAGNOSIS — T8249XA Other complication of vascular dialysis catheter, initial encounter: Secondary | ICD-10-CM | POA: Diagnosis not present

## 2021-03-02 DIAGNOSIS — N186 End stage renal disease: Secondary | ICD-10-CM | POA: Diagnosis not present

## 2021-03-02 DIAGNOSIS — N2581 Secondary hyperparathyroidism of renal origin: Secondary | ICD-10-CM | POA: Diagnosis not present

## 2021-03-03 DIAGNOSIS — N041 Nephrotic syndrome with focal and segmental glomerular lesions: Secondary | ICD-10-CM | POA: Diagnosis not present

## 2021-03-03 DIAGNOSIS — N186 End stage renal disease: Secondary | ICD-10-CM | POA: Diagnosis not present

## 2021-03-03 DIAGNOSIS — Z992 Dependence on renal dialysis: Secondary | ICD-10-CM | POA: Diagnosis not present

## 2021-03-04 DIAGNOSIS — N186 End stage renal disease: Secondary | ICD-10-CM | POA: Diagnosis not present

## 2021-03-04 DIAGNOSIS — D509 Iron deficiency anemia, unspecified: Secondary | ICD-10-CM | POA: Diagnosis not present

## 2021-03-04 DIAGNOSIS — N2581 Secondary hyperparathyroidism of renal origin: Secondary | ICD-10-CM | POA: Diagnosis not present

## 2021-03-04 DIAGNOSIS — T8249XS Other complication of vascular dialysis catheter, sequela: Secondary | ICD-10-CM | POA: Diagnosis not present

## 2021-03-04 DIAGNOSIS — Z992 Dependence on renal dialysis: Secondary | ICD-10-CM | POA: Diagnosis not present

## 2021-03-04 DIAGNOSIS — L299 Pruritus, unspecified: Secondary | ICD-10-CM | POA: Diagnosis not present

## 2021-03-04 DIAGNOSIS — T8249XA Other complication of vascular dialysis catheter, initial encounter: Secondary | ICD-10-CM | POA: Diagnosis not present

## 2021-03-04 DIAGNOSIS — D689 Coagulation defect, unspecified: Secondary | ICD-10-CM | POA: Diagnosis not present

## 2021-03-06 DIAGNOSIS — T8249XA Other complication of vascular dialysis catheter, initial encounter: Secondary | ICD-10-CM | POA: Diagnosis not present

## 2021-03-06 DIAGNOSIS — N2581 Secondary hyperparathyroidism of renal origin: Secondary | ICD-10-CM | POA: Diagnosis not present

## 2021-03-06 DIAGNOSIS — Z992 Dependence on renal dialysis: Secondary | ICD-10-CM | POA: Diagnosis not present

## 2021-03-06 DIAGNOSIS — D509 Iron deficiency anemia, unspecified: Secondary | ICD-10-CM | POA: Diagnosis not present

## 2021-03-06 DIAGNOSIS — T8249XS Other complication of vascular dialysis catheter, sequela: Secondary | ICD-10-CM | POA: Diagnosis not present

## 2021-03-06 DIAGNOSIS — D689 Coagulation defect, unspecified: Secondary | ICD-10-CM | POA: Diagnosis not present

## 2021-03-06 DIAGNOSIS — N186 End stage renal disease: Secondary | ICD-10-CM | POA: Diagnosis not present

## 2021-03-06 DIAGNOSIS — L299 Pruritus, unspecified: Secondary | ICD-10-CM | POA: Diagnosis not present

## 2021-03-09 DIAGNOSIS — D509 Iron deficiency anemia, unspecified: Secondary | ICD-10-CM | POA: Diagnosis not present

## 2021-03-09 DIAGNOSIS — T8249XA Other complication of vascular dialysis catheter, initial encounter: Secondary | ICD-10-CM | POA: Diagnosis not present

## 2021-03-09 DIAGNOSIS — N186 End stage renal disease: Secondary | ICD-10-CM | POA: Diagnosis not present

## 2021-03-09 DIAGNOSIS — L299 Pruritus, unspecified: Secondary | ICD-10-CM | POA: Diagnosis not present

## 2021-03-09 DIAGNOSIS — D689 Coagulation defect, unspecified: Secondary | ICD-10-CM | POA: Diagnosis not present

## 2021-03-09 DIAGNOSIS — N2581 Secondary hyperparathyroidism of renal origin: Secondary | ICD-10-CM | POA: Diagnosis not present

## 2021-03-09 DIAGNOSIS — Z992 Dependence on renal dialysis: Secondary | ICD-10-CM | POA: Diagnosis not present

## 2021-03-09 DIAGNOSIS — T8249XS Other complication of vascular dialysis catheter, sequela: Secondary | ICD-10-CM | POA: Diagnosis not present

## 2021-03-13 DIAGNOSIS — N186 End stage renal disease: Secondary | ICD-10-CM | POA: Diagnosis not present

## 2021-03-13 DIAGNOSIS — D689 Coagulation defect, unspecified: Secondary | ICD-10-CM | POA: Diagnosis not present

## 2021-03-13 DIAGNOSIS — N2581 Secondary hyperparathyroidism of renal origin: Secondary | ICD-10-CM | POA: Diagnosis not present

## 2021-03-13 DIAGNOSIS — T8249XS Other complication of vascular dialysis catheter, sequela: Secondary | ICD-10-CM | POA: Diagnosis not present

## 2021-03-13 DIAGNOSIS — L299 Pruritus, unspecified: Secondary | ICD-10-CM | POA: Diagnosis not present

## 2021-03-13 DIAGNOSIS — D509 Iron deficiency anemia, unspecified: Secondary | ICD-10-CM | POA: Diagnosis not present

## 2021-03-13 DIAGNOSIS — Z992 Dependence on renal dialysis: Secondary | ICD-10-CM | POA: Diagnosis not present

## 2021-03-13 DIAGNOSIS — T8249XA Other complication of vascular dialysis catheter, initial encounter: Secondary | ICD-10-CM | POA: Diagnosis not present

## 2021-03-16 DIAGNOSIS — T8249XA Other complication of vascular dialysis catheter, initial encounter: Secondary | ICD-10-CM | POA: Diagnosis not present

## 2021-03-16 DIAGNOSIS — D509 Iron deficiency anemia, unspecified: Secondary | ICD-10-CM | POA: Diagnosis not present

## 2021-03-16 DIAGNOSIS — T8249XS Other complication of vascular dialysis catheter, sequela: Secondary | ICD-10-CM | POA: Diagnosis not present

## 2021-03-16 DIAGNOSIS — D689 Coagulation defect, unspecified: Secondary | ICD-10-CM | POA: Diagnosis not present

## 2021-03-16 DIAGNOSIS — Z992 Dependence on renal dialysis: Secondary | ICD-10-CM | POA: Diagnosis not present

## 2021-03-16 DIAGNOSIS — L299 Pruritus, unspecified: Secondary | ICD-10-CM | POA: Diagnosis not present

## 2021-03-16 DIAGNOSIS — N186 End stage renal disease: Secondary | ICD-10-CM | POA: Diagnosis not present

## 2021-03-16 DIAGNOSIS — N2581 Secondary hyperparathyroidism of renal origin: Secondary | ICD-10-CM | POA: Diagnosis not present

## 2021-03-18 DIAGNOSIS — T8249XS Other complication of vascular dialysis catheter, sequela: Secondary | ICD-10-CM | POA: Diagnosis not present

## 2021-03-18 DIAGNOSIS — T8249XA Other complication of vascular dialysis catheter, initial encounter: Secondary | ICD-10-CM | POA: Diagnosis not present

## 2021-03-18 DIAGNOSIS — D509 Iron deficiency anemia, unspecified: Secondary | ICD-10-CM | POA: Diagnosis not present

## 2021-03-18 DIAGNOSIS — N186 End stage renal disease: Secondary | ICD-10-CM | POA: Diagnosis not present

## 2021-03-18 DIAGNOSIS — L299 Pruritus, unspecified: Secondary | ICD-10-CM | POA: Diagnosis not present

## 2021-03-18 DIAGNOSIS — N2581 Secondary hyperparathyroidism of renal origin: Secondary | ICD-10-CM | POA: Diagnosis not present

## 2021-03-18 DIAGNOSIS — Z992 Dependence on renal dialysis: Secondary | ICD-10-CM | POA: Diagnosis not present

## 2021-03-18 DIAGNOSIS — D689 Coagulation defect, unspecified: Secondary | ICD-10-CM | POA: Diagnosis not present

## 2021-03-23 DIAGNOSIS — T8249XS Other complication of vascular dialysis catheter, sequela: Secondary | ICD-10-CM | POA: Diagnosis not present

## 2021-03-23 DIAGNOSIS — T8249XA Other complication of vascular dialysis catheter, initial encounter: Secondary | ICD-10-CM | POA: Diagnosis not present

## 2021-03-23 DIAGNOSIS — N186 End stage renal disease: Secondary | ICD-10-CM | POA: Diagnosis not present

## 2021-03-23 DIAGNOSIS — L299 Pruritus, unspecified: Secondary | ICD-10-CM | POA: Diagnosis not present

## 2021-03-23 DIAGNOSIS — D509 Iron deficiency anemia, unspecified: Secondary | ICD-10-CM | POA: Diagnosis not present

## 2021-03-23 DIAGNOSIS — Z992 Dependence on renal dialysis: Secondary | ICD-10-CM | POA: Diagnosis not present

## 2021-03-23 DIAGNOSIS — D689 Coagulation defect, unspecified: Secondary | ICD-10-CM | POA: Diagnosis not present

## 2021-03-23 DIAGNOSIS — N2581 Secondary hyperparathyroidism of renal origin: Secondary | ICD-10-CM | POA: Diagnosis not present

## 2021-03-25 DIAGNOSIS — N2581 Secondary hyperparathyroidism of renal origin: Secondary | ICD-10-CM | POA: Diagnosis not present

## 2021-03-25 DIAGNOSIS — L299 Pruritus, unspecified: Secondary | ICD-10-CM | POA: Diagnosis not present

## 2021-03-25 DIAGNOSIS — N186 End stage renal disease: Secondary | ICD-10-CM | POA: Diagnosis not present

## 2021-03-25 DIAGNOSIS — D509 Iron deficiency anemia, unspecified: Secondary | ICD-10-CM | POA: Diagnosis not present

## 2021-03-25 DIAGNOSIS — T8249XS Other complication of vascular dialysis catheter, sequela: Secondary | ICD-10-CM | POA: Diagnosis not present

## 2021-03-25 DIAGNOSIS — D689 Coagulation defect, unspecified: Secondary | ICD-10-CM | POA: Diagnosis not present

## 2021-03-25 DIAGNOSIS — Z992 Dependence on renal dialysis: Secondary | ICD-10-CM | POA: Diagnosis not present

## 2021-03-25 DIAGNOSIS — T8249XA Other complication of vascular dialysis catheter, initial encounter: Secondary | ICD-10-CM | POA: Diagnosis not present

## 2021-03-27 DIAGNOSIS — N186 End stage renal disease: Secondary | ICD-10-CM | POA: Diagnosis not present

## 2021-03-27 DIAGNOSIS — T8249XA Other complication of vascular dialysis catheter, initial encounter: Secondary | ICD-10-CM | POA: Diagnosis not present

## 2021-03-27 DIAGNOSIS — N2581 Secondary hyperparathyroidism of renal origin: Secondary | ICD-10-CM | POA: Diagnosis not present

## 2021-03-27 DIAGNOSIS — T8249XS Other complication of vascular dialysis catheter, sequela: Secondary | ICD-10-CM | POA: Diagnosis not present

## 2021-03-27 DIAGNOSIS — D509 Iron deficiency anemia, unspecified: Secondary | ICD-10-CM | POA: Diagnosis not present

## 2021-03-27 DIAGNOSIS — L299 Pruritus, unspecified: Secondary | ICD-10-CM | POA: Diagnosis not present

## 2021-03-27 DIAGNOSIS — Z992 Dependence on renal dialysis: Secondary | ICD-10-CM | POA: Diagnosis not present

## 2021-03-27 DIAGNOSIS — D689 Coagulation defect, unspecified: Secondary | ICD-10-CM | POA: Diagnosis not present

## 2021-03-30 DIAGNOSIS — N186 End stage renal disease: Secondary | ICD-10-CM | POA: Diagnosis not present

## 2021-03-30 DIAGNOSIS — N2581 Secondary hyperparathyroidism of renal origin: Secondary | ICD-10-CM | POA: Diagnosis not present

## 2021-03-30 DIAGNOSIS — T8249XA Other complication of vascular dialysis catheter, initial encounter: Secondary | ICD-10-CM | POA: Diagnosis not present

## 2021-03-30 DIAGNOSIS — D689 Coagulation defect, unspecified: Secondary | ICD-10-CM | POA: Diagnosis not present

## 2021-03-30 DIAGNOSIS — T8249XS Other complication of vascular dialysis catheter, sequela: Secondary | ICD-10-CM | POA: Diagnosis not present

## 2021-03-30 DIAGNOSIS — Z992 Dependence on renal dialysis: Secondary | ICD-10-CM | POA: Diagnosis not present

## 2021-03-30 DIAGNOSIS — D509 Iron deficiency anemia, unspecified: Secondary | ICD-10-CM | POA: Diagnosis not present

## 2021-03-30 DIAGNOSIS — L299 Pruritus, unspecified: Secondary | ICD-10-CM | POA: Diagnosis not present

## 2021-04-01 DIAGNOSIS — T8249XA Other complication of vascular dialysis catheter, initial encounter: Secondary | ICD-10-CM | POA: Diagnosis not present

## 2021-04-01 DIAGNOSIS — N186 End stage renal disease: Secondary | ICD-10-CM | POA: Diagnosis not present

## 2021-04-01 DIAGNOSIS — T8249XS Other complication of vascular dialysis catheter, sequela: Secondary | ICD-10-CM | POA: Diagnosis not present

## 2021-04-01 DIAGNOSIS — D689 Coagulation defect, unspecified: Secondary | ICD-10-CM | POA: Diagnosis not present

## 2021-04-01 DIAGNOSIS — N2581 Secondary hyperparathyroidism of renal origin: Secondary | ICD-10-CM | POA: Diagnosis not present

## 2021-04-01 DIAGNOSIS — Z992 Dependence on renal dialysis: Secondary | ICD-10-CM | POA: Diagnosis not present

## 2021-04-01 DIAGNOSIS — D509 Iron deficiency anemia, unspecified: Secondary | ICD-10-CM | POA: Diagnosis not present

## 2021-04-01 DIAGNOSIS — L299 Pruritus, unspecified: Secondary | ICD-10-CM | POA: Diagnosis not present

## 2021-04-03 ENCOUNTER — Other Ambulatory Visit: Payer: Self-pay | Admitting: *Deleted

## 2021-04-03 DIAGNOSIS — N186 End stage renal disease: Secondary | ICD-10-CM

## 2021-04-03 DIAGNOSIS — N041 Nephrotic syndrome with focal and segmental glomerular lesions: Secondary | ICD-10-CM | POA: Diagnosis not present

## 2021-04-03 DIAGNOSIS — Z992 Dependence on renal dialysis: Secondary | ICD-10-CM | POA: Diagnosis not present

## 2021-04-06 DIAGNOSIS — T8249XA Other complication of vascular dialysis catheter, initial encounter: Secondary | ICD-10-CM | POA: Diagnosis not present

## 2021-04-06 DIAGNOSIS — T8249XS Other complication of vascular dialysis catheter, sequela: Secondary | ICD-10-CM | POA: Diagnosis not present

## 2021-04-06 DIAGNOSIS — D509 Iron deficiency anemia, unspecified: Secondary | ICD-10-CM | POA: Diagnosis not present

## 2021-04-06 DIAGNOSIS — N186 End stage renal disease: Secondary | ICD-10-CM | POA: Diagnosis not present

## 2021-04-06 DIAGNOSIS — D689 Coagulation defect, unspecified: Secondary | ICD-10-CM | POA: Diagnosis not present

## 2021-04-06 DIAGNOSIS — Z992 Dependence on renal dialysis: Secondary | ICD-10-CM | POA: Diagnosis not present

## 2021-04-06 DIAGNOSIS — N2581 Secondary hyperparathyroidism of renal origin: Secondary | ICD-10-CM | POA: Diagnosis not present

## 2021-04-06 DIAGNOSIS — L299 Pruritus, unspecified: Secondary | ICD-10-CM | POA: Diagnosis not present

## 2021-04-07 ENCOUNTER — Other Ambulatory Visit: Payer: Self-pay

## 2021-04-07 ENCOUNTER — Ambulatory Visit (INDEPENDENT_AMBULATORY_CARE_PROVIDER_SITE_OTHER): Payer: Medicare Other | Admitting: Vascular Surgery

## 2021-04-07 ENCOUNTER — Ambulatory Visit (HOSPITAL_COMMUNITY)
Admission: RE | Admit: 2021-04-07 | Discharge: 2021-04-07 | Disposition: A | Discharge status: Home / Self Care | Payer: Medicare Other | Source: Ambulatory Visit | Attending: Vascular Surgery | Admitting: Vascular Surgery

## 2021-04-07 ENCOUNTER — Encounter: Payer: Self-pay | Admitting: Vascular Surgery

## 2021-04-07 VITALS — BP 149/81 | HR 97 | Temp 97.8°F | Resp 16 | Ht 69.0 in | Wt 304.0 lb

## 2021-04-07 DIAGNOSIS — N186 End stage renal disease: Secondary | ICD-10-CM | POA: Diagnosis not present

## 2021-04-07 DIAGNOSIS — Z992 Dependence on renal dialysis: Secondary | ICD-10-CM

## 2021-04-07 NOTE — Progress Notes (Signed)
? ?Patient name: [name] MRN: [id number] DOB: 1975/12/16 Sex: female ? ?REASON FOR VISIT: Follow-up after left first stage basilic vein fistula ? ?HPI: ?[name] is a 46 y.o. female with hx ESRD on HD M/W/F, HTN, HLD, morbid obesity that presents for hospital follow-up after left first stage basilic vein fistula on 08/16/7515.  She states her incision is healing.  She can feel the thrill.  She has no concerns today.  No signs of steal. ? ?She has a complex history of previous AV fistulas including a right brachiocephalic in 0017, left brachiocephalic in 4944, thrombectomy of left brachial artery with a bovine patch and ligation of the left brachiocephalic fistula for a thromboembolic event, right basilic, and right upper extremity AV graft with multiple revisions. ? ? ?Past Medical History:  ?Diagnosis Date  ? Anemia   ? Anxiety   ? pt not aware of this  ? Arthritis   ? Breast mass 09/01/2016  ? Targeted ultrasound is performed, showing a mass in the left breast at 11 o'clock, 1 cm from the nipple measuring 6.4 by 5.0 cm. The mass is heterogeneous and may contain macroscopic fat.   IMPRESSION: The mass in the left breast is probably benign, possibly a Hamartoma.  RECOMMENDATION: Recommend six-month follow-up mammogram of the probably benign left breast mass. If necessary, an ultrasound co  ? CHF (congestive heart failure) ([place])   ? hosp. 2011  ? Chronic kidney disease   ? Mon Wed Friday  ? Dilated cardiomyopathy ([place])   ? Encounter for blood transfusion   ? Family history of adverse reaction to anesthesia   ?  " Aunt has a tracheostomy and kept coughing after colonoscopy "  ? Foot fracture   ? Gout   ? no flare up for several yrs  ? Heart murmur   ? as a child, no problems as an adult  ? History of blood transfusion   ? x 2  ? History of pneumonia   ? Hyperlipidemia   ? Hypertension   ? Menorrhagia   ? Morbid obesity ([place])   ? Pneumonia   ? hosp. 2010  ? Shortness of breath   ? HX CHL - occasional  ?  Syncope 01/27/2013  ? SYSTOLIC HEART FAILURE, ACUTE 04/20/2008  ? Qualifier: Diagnosis of  By: Ronne Binning    ? 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 Thyroid disease   ? ? ?Past Surgical History:  ?Procedure Laterality Date  ? ANGIOPLASTY  07/28/2015  ? Procedure: ANGIOPLASTY OF RIGHT BRACHIAL VEIN;  Surgeon: Conrad [place], MD;  Location: [place];  Service: Vascular;;  ? AV FISTULA PLACEMENT  08/04/2011  ? Procedure: ARTERIOVENOUS (AV) FISTULA CREATION;  Surgeon: Elam Dutch, MD;  Location[facility] OR;  Service: Vascular;  Laterality: Right;  Creation right brachiocephalic arteriovenous fistula  ? AV FISTULA PLACEMENT Left 02/05/2013  ? Procedure: ARTERIOVENOUS FISTULA CREATION LEFT ARM;  Surgeon: Conrad Chenoweth, MD;  Location: [place];  Service: Vascular;  Laterality: Left;  ? AV FISTULA PLACEMENT Right 06/04/2013  ? Procedure: ARTERIOVENOUS (AV) FISTULA CREATION;  Surgeon: Conrad Harrison, MD;  Location: [place];  Service: Vascular;  Laterality: Right;  ? AV FISTULA PLACEMENT Right 04/15/2014  ? Procedure: INSERTION OF ARTERIOVENOUS (AV) GORE-TEX GRAFT ARM;  Surgeon: Conrad , MD;  Location: [place];  Service: Vascular;  Laterality: Right;  ? AV FISTULA PLACEMENT Left 02/25/2021  ? Procedure: LEFT ARM ARTERIOVENOUS (AV) FISTULA CREATION;  Surgeon: Marty Heck, MD;  Location: [place];  Service: Vascular;  Laterality: Left;  ? BASCILIC VEIN TRANSPOSITION Right 08/13/2013  ? Procedure: Second Stage Brachial Vein Transposition;  Surgeon: Conrad [place], MD;  Location: [place];  Service: Vascular;  Laterality: Right;  ? [place]  ? repair of hole in heart  ? INCISION AND DRAINAGE OF WOUND Right 07/28/2015  ? Procedure: IRRIGATION AND DEBRIDEMENT RIGHT HAND  WOUND;  Surgeon: Dayna Barker, MD;  Location: [place];  Service: Plastics;  Laterality: Right;  ? LIGATION OF ARTERIOVENOUS  FISTULA Left 03/05/2013  ? Procedure: LIGATION OF ARTERIOVENOUS  FISTULA;  Surgeon: Conrad [place], MD;  Location: [place];  Service: Vascular;  Laterality: Left;  ? PATCH ANGIOPLASTY Left 03/05/2013  ? Procedure: PATCH ANGIOPLASTY;  Surgeon: Conrad [place], MD;  Location: [place];  Service: Vascular;  Laterality: Left;  ? PERIPHERAL VASCULAR CATHETERIZATION Right 10/31/2014  ? Procedure: Fistulagram;  Surgeon: Conrad Spaulding, MD;  Location: [place] [facility] LAB;  Service: Cardiovascular;  Laterality: Right;  ? PERIPHERAL VASCULAR CATHETERIZATION Right 10/31/2014  ? Procedure: Peripheral Vascular Balloon Angioplasty;  Surgeon: Conrad [place], MD;  Location: [place] [facility];  Service: Cardiovascular;  Laterality: Right;  pta venous rt arm  ? PERIPHERAL VASCULAR CATHETERIZATION N/A 03/13/2015  ? Procedure: A/V Shuntogram;  Surgeon: Conrad Merrillan, MD;  Location: [place] [facility];  Service: Cardiovascular;  Laterality: N/A;  ? PERIPHERAL VASCULAR CATHETERIZATION Right 03/13/2015  ? Procedure: Peripheral Vascular Balloon Angioplasty;  Surgeon: Conrad [place], MD;  Location: [place] [facility];  Service: Cardiovascular;  Laterality: Right;  Shunt  ? PERIPHERAL VASCULAR CATHETERIZATION Right 01/19/2016  ? Procedure: A/V Shuntogram;  Surgeon: Angelia Mould, MD;  Location: [place] [facility];  Service: Cardiovascular;  Laterality: Right;  ? REVISION OF ARTERIOVENOUS GORETEX GRAFT Right 10/27/2015  ? Procedure: THROMBECTOMY AND REVISION OF RIGHT UPPER ARM  ARTERIOVENOUS GORETEX GRAFT USING 6MM X 10CM GORE-TEX GRAFT;  Surgeon: Elam Dutch, MD;  Location: [place];  Service: Vascular;  Laterality: Right;  ? REVISION OF ARTERIOVENOUS GORETEX GRAFT Right 01/30/2016  ? Procedure: REVISION OF RIGHT UPPER ARM ARTERIOVENOUS USING GORETEX GRAFT;  Surgeon: Angelia Mould, MD;  Location: [place];  Service: Vascular;  Laterality: Right;  ? THROMBECTOMY W/ EMBOLECTOMY Left 03/05/2013  ? Procedure: THROMBECTOMY OF LEFT BRACHIAL ARTERY;  Surgeon: Conrad Lillie, MD;  Location: [place];  Service: Vascular;  Laterality: Left;  ? VENOGRAM Bilateral 03/25/2014  ?  Procedure: VENOGRAM bilateral;  Surgeon: Conrad , MD;  Location: [facility] CATH LAB;  Service: Cardiovascular;  Laterality: Bilateral;  ? ? ?Family History  ?Problem Relation Age of Onset  ? Diabetes Mother   ? Hyperlipidemia Mother   ? Hypertension Mother   ? Kidney disease Mother   ? Anesthesia problems Neg Hx   ? Hypotension Neg Hx   ? Malignant hyperthermia Neg Hx   ? Pseudochol deficiency Neg Hx   ? ? ?SOCIAL HISTORY: ?Social History  ? ?Tobacco Use  ? Smoking status: Never  ? Smokeless tobacco: Never  ?Substance Use Topics  ? Alcohol use: No  ?  Alcohol/week: 0.0 standard drinks  ? ? ?Allergies  ?Allergen Reactions  ? Pollen Extract Other (See Comments)  ?  Unknown  ? Oxycodone-Acetaminophen Itching  ?  Tolerates with benadryl   ? ? ?Current Outpatient Medications  ?Medication Sig Dispense Refill  ? AURYXIA 1 GM 210 MG(Fe) tablet Take  630 mg by mouth See admin instructions. Take 3 tablets (630 mg) by mouth with meals and snacks    ? cinacalcet (SENSIPAR) 60 MG tablet Take 60 mg by mouth in the morning.    ? megestrol (MEGACE) 20 MG tablet Take 2 tablets (40 mg total) by mouth daily. 60 tablet 11  ? PROAIR HFA 108 (90 Base) MCG/ACT inhaler Inhale 1-2 puffs into the lungs 3 (three) times daily as needed for wheezing or shortness of breath. For wheezing.  2  ? HYDROcodone-acetaminophen (NORCO/VICODIN) 5-325 MG tablet Take 1 tablet by mouth every 4 (four) hours as needed for moderate pain. (Patient not taking: Reported on 04/07/2021) 20 tablet 0  ? ?No current facility-administered medications for this visit.  ? ? ?REVIEW OF SYSTEMS:  ?'[X]'$  denotes positive finding, '[ ]'$  denotes negative finding ?Cardiac  Comments:  ?Chest pain or chest pressure:    ?Shortness of breath upon exertion:    ?Short of breath when lying flat:    ?Irregular heart rhythm:    ?    ?Vascular    ?Pain in calf, thigh, or hip brought on by ambulation:    ?Pain in feet at night that wakes you up from your sleep:     ?Blood clot in your veins:     ?Leg swelling:     ?    ?Pulmonary    ?Oxygen at home:    ?Productive cough:     ?Wheezing:     ?    ?Neurologic    ?Sudden weakness in arms or legs:     ?Sudden numbness in arms or legs:     ?Sudden onset of d

## 2021-04-08 DIAGNOSIS — L299 Pruritus, unspecified: Secondary | ICD-10-CM | POA: Diagnosis not present

## 2021-04-08 DIAGNOSIS — N2581 Secondary hyperparathyroidism of renal origin: Secondary | ICD-10-CM | POA: Diagnosis not present

## 2021-04-08 DIAGNOSIS — N186 End stage renal disease: Secondary | ICD-10-CM | POA: Diagnosis not present

## 2021-04-08 DIAGNOSIS — D689 Coagulation defect, unspecified: Secondary | ICD-10-CM | POA: Diagnosis not present

## 2021-04-08 DIAGNOSIS — D509 Iron deficiency anemia, unspecified: Secondary | ICD-10-CM | POA: Diagnosis not present

## 2021-04-08 DIAGNOSIS — T8249XS Other complication of vascular dialysis catheter, sequela: Secondary | ICD-10-CM | POA: Diagnosis not present

## 2021-04-08 DIAGNOSIS — T8249XA Other complication of vascular dialysis catheter, initial encounter: Secondary | ICD-10-CM | POA: Diagnosis not present

## 2021-04-08 DIAGNOSIS — Z992 Dependence on renal dialysis: Secondary | ICD-10-CM | POA: Diagnosis not present

## 2021-04-09 ENCOUNTER — Other Ambulatory Visit: Payer: Self-pay

## 2021-04-09 ENCOUNTER — Encounter (HOSPITAL_COMMUNITY): Payer: Self-pay | Admitting: Vascular Surgery

## 2021-04-09 NOTE — Progress Notes (Signed)
[name] denies chest pain or shortness of breath. Patient denies having any s/s of Covid in her household.  Patient denies any known exposure to Covid.  ? ?PCP is Dr. Glendale Chard.  [name] has dialysis on Mon, Tues, Fri; arrangements have been made for patient to have dialysis on Tuesday, April 11. ? ?I instructed [name]  to shower with antibacteria soap.  Do not shave. No nail polish, artificial or acrylic nails. Wear clean clothes, brush your teeth. ?Glasses, contact lens,dentures or partials may not be worn in the OR. If you need to wear them, please bring a case for glasses, do not wear contacts or bring a case, the hospital does not have contact cases, dentures or partials will have to be removed , make sure they are clean, we will provide a denture cup to put them in. You will need some one to drive you home and a responsible person over the age of 42 to stay with you for the first 24 hours after surgery.  ?

## 2021-04-10 DIAGNOSIS — Z992 Dependence on renal dialysis: Secondary | ICD-10-CM | POA: Diagnosis not present

## 2021-04-10 DIAGNOSIS — N2581 Secondary hyperparathyroidism of renal origin: Secondary | ICD-10-CM | POA: Diagnosis not present

## 2021-04-10 DIAGNOSIS — N186 End stage renal disease: Secondary | ICD-10-CM | POA: Diagnosis not present

## 2021-04-10 DIAGNOSIS — L299 Pruritus, unspecified: Secondary | ICD-10-CM | POA: Diagnosis not present

## 2021-04-10 DIAGNOSIS — T8249XS Other complication of vascular dialysis catheter, sequela: Secondary | ICD-10-CM | POA: Diagnosis not present

## 2021-04-10 DIAGNOSIS — D689 Coagulation defect, unspecified: Secondary | ICD-10-CM | POA: Diagnosis not present

## 2021-04-10 DIAGNOSIS — T8249XA Other complication of vascular dialysis catheter, initial encounter: Secondary | ICD-10-CM | POA: Diagnosis not present

## 2021-04-10 DIAGNOSIS — D509 Iron deficiency anemia, unspecified: Secondary | ICD-10-CM | POA: Diagnosis not present

## 2021-04-13 ENCOUNTER — Ambulatory Visit (HOSPITAL_COMMUNITY): Payer: Medicare Other | Admitting: Anesthesiology

## 2021-04-13 ENCOUNTER — Encounter (HOSPITAL_COMMUNITY): Payer: Self-pay | Admitting: Vascular Surgery

## 2021-04-13 ENCOUNTER — Other Ambulatory Visit: Payer: Self-pay

## 2021-04-13 ENCOUNTER — Encounter (HOSPITAL_COMMUNITY)
Admission: RE | Disposition: A | Discharge status: Home / Self Care | Payer: Self-pay | Source: Home / Self Care | Attending: Vascular Surgery

## 2021-04-13 ENCOUNTER — Ambulatory Visit (HOSPITAL_COMMUNITY)
Admission: RE | Admit: 2021-04-13 | Discharge: 2021-04-13 | Disposition: A | Discharge status: Home / Self Care | Payer: Medicare Other | Attending: Vascular Surgery | Admitting: Vascular Surgery

## 2021-04-13 ENCOUNTER — Ambulatory Visit (HOSPITAL_BASED_OUTPATIENT_CLINIC_OR_DEPARTMENT_OTHER): Payer: Medicare Other | Admitting: Anesthesiology

## 2021-04-13 DIAGNOSIS — I509 Heart failure, unspecified: Secondary | ICD-10-CM | POA: Diagnosis not present

## 2021-04-13 DIAGNOSIS — N186 End stage renal disease: Secondary | ICD-10-CM | POA: Diagnosis not present

## 2021-04-13 DIAGNOSIS — E785 Hyperlipidemia, unspecified: Secondary | ICD-10-CM | POA: Diagnosis not present

## 2021-04-13 DIAGNOSIS — I132 Hypertensive heart and chronic kidney disease with heart failure and with stage 5 chronic kidney disease, or end stage renal disease: Secondary | ICD-10-CM | POA: Insufficient documentation

## 2021-04-13 DIAGNOSIS — Z992 Dependence on renal dialysis: Secondary | ICD-10-CM

## 2021-04-13 DIAGNOSIS — I5032 Chronic diastolic (congestive) heart failure: Secondary | ICD-10-CM | POA: Diagnosis not present

## 2021-04-13 DIAGNOSIS — D631 Anemia in chronic kidney disease: Secondary | ICD-10-CM | POA: Diagnosis not present

## 2021-04-13 DIAGNOSIS — N185 Chronic kidney disease, stage 5: Secondary | ICD-10-CM | POA: Diagnosis not present

## 2021-04-13 HISTORY — DX: Headache, unspecified: R51.9

## 2021-04-13 HISTORY — PX: BASCILIC VEIN TRANSPOSITION: SHX5742

## 2021-04-13 HISTORY — DX: End stage renal disease: N18.6

## 2021-04-13 LAB — POCT I-STAT, CHEM 8
BUN: 50 mg/dL — ABNORMAL HIGH (ref 6–20)
Calcium, Ion: 0.96 mmol/L — ABNORMAL LOW (ref 1.15–1.40)
Chloride: 100 mmol/L (ref 98–111)
Creatinine, Ser: 13.4 mg/dL — ABNORMAL HIGH (ref 0.44–1.00)
Glucose, Bld: 98 mg/dL (ref 70–99)
HCT: 30 % — ABNORMAL LOW (ref 36.0–46.0)
Hemoglobin: 10.2 g/dL — ABNORMAL LOW (ref 12.0–15.0)
Potassium: 3.7 mmol/L (ref 3.5–5.1)
Sodium: 136 mmol/L (ref 135–145)
TCO2: 27 mmol/L (ref 22–32)

## 2021-04-13 LAB — POCT PREGNANCY, URINE: Preg Test, Ur: NEGATIVE

## 2021-04-13 SURGERY — TRANSPOSITION, VEIN, BASILIC
Anesthesia: General | Laterality: Left

## 2021-04-13 MED ORDER — PHENYLEPHRINE HCL-NACL 20-0.9 MG/250ML-% IV SOLN
INTRAVENOUS | Status: DC | PRN
  Administered 2021-04-13: 30 ug/min via INTRAVENOUS

## 2021-04-13 MED ORDER — HEPARIN SODIUM (PORCINE) 1000 UNIT/ML IJ SOLN
1.9000 mL | Freq: Once | INTRAMUSCULAR | Status: AC
  Administered 2021-04-13: 1900 [IU] via INTRAVENOUS

## 2021-04-13 MED ORDER — HEPARIN SOD (PORK) LOCK FLUSH 10 UNIT/ML IV SOLN
20.0000 [IU] | INTRAVENOUS | Status: DC | PRN
  Filled 2021-04-13: qty 2

## 2021-04-13 MED ORDER — SODIUM CHLORIDE 0.9 % IV SOLN
INTRAVENOUS | Status: DC
Start: 2021-04-13 — End: 2021-04-13

## 2021-04-13 MED ORDER — LIDOCAINE 2% (20 MG/ML) 5 ML SYRINGE
INTRAMUSCULAR | Status: AC
  Filled 2021-04-13: qty 5

## 2021-04-13 MED ORDER — CHLORHEXIDINE GLUCONATE 0.12 % MT SOLN
15.0000 mL | Freq: Once | OROMUCOSAL | Status: AC
  Administered 2021-04-13: 15 mL via OROMUCOSAL
  Filled 2021-04-13: qty 15

## 2021-04-13 MED ORDER — CHLORHEXIDINE GLUCONATE 4 % EX LIQD: 60.0000 mL | Freq: Once | CUTANEOUS | Status: DC

## 2021-04-13 MED ORDER — HEPARIN SODIUM (PORCINE) 1000 UNIT/ML IJ SOLN
INTRAMUSCULAR | Status: DC | PRN
  Administered 2021-04-13: 5000 [IU] via INTRAVENOUS

## 2021-04-13 MED ORDER — 0.9 % SODIUM CHLORIDE (POUR BTL) OPTIME
TOPICAL | Status: DC | PRN
  Administered 2021-04-13: 1000 mL

## 2021-04-13 MED ORDER — PHENYLEPHRINE 40 MCG/ML (10ML) SYRINGE FOR IV PUSH (FOR BLOOD PRESSURE SUPPORT)
PREFILLED_SYRINGE | INTRAVENOUS | Status: DC | PRN
  Administered 2021-04-13: 40 ug via INTRAVENOUS
  Administered 2021-04-13: 20 ug via INTRAVENOUS
  Administered 2021-04-13: 40 ug via INTRAVENOUS

## 2021-04-13 MED ORDER — ORAL CARE MOUTH RINSE: 15.0000 mL | Freq: Once | OROMUCOSAL | Status: AC

## 2021-04-13 MED ORDER — HYDROMORPHONE HCL 1 MG/ML IJ SOLN
0.2500 mg | INTRAMUSCULAR | Status: DC | PRN
  Administered 2021-04-13: 0.25 mg via INTRAVENOUS

## 2021-04-13 MED ORDER — ACETAMINOPHEN 500 MG PO TABS
1000.0000 mg | ORAL_TABLET | Freq: Once | ORAL | Status: AC
  Administered 2021-04-13: 1000 mg via ORAL
  Filled 2021-04-13: qty 2

## 2021-04-13 MED ORDER — HYDROMORPHONE HCL 1 MG/ML IJ SOLN
INTRAMUSCULAR | Status: AC
  Filled 2021-04-13: qty 1

## 2021-04-13 MED ORDER — ONDANSETRON HCL 4 MG/2ML IJ SOLN
INTRAMUSCULAR | Status: AC
  Filled 2021-04-13: qty 2

## 2021-04-13 MED ORDER — HEPARIN 6000 UNIT IRRIGATION SOLUTION
Status: AC
  Filled 2021-04-13: qty 500

## 2021-04-13 MED ORDER — PROPOFOL 10 MG/ML IV BOLUS
INTRAVENOUS | Status: AC
  Filled 2021-04-13: qty 20

## 2021-04-13 MED ORDER — CHLORHEXIDINE GLUCONATE 4 % EX LIQD
60.0000 mL | Freq: Once | CUTANEOUS | Status: DC
Start: 2021-04-13 — End: 2021-04-13

## 2021-04-13 MED ORDER — ONDANSETRON HCL 4 MG/2ML IJ SOLN
INTRAMUSCULAR | Status: DC | PRN
Start: 2021-04-13 — End: 2021-04-13
  Administered 2021-04-13: 4 mg via INTRAVENOUS

## 2021-04-13 MED ORDER — HEPARIN SODIUM (PORCINE) 1000 UNIT/ML IJ SOLN
INTRAMUSCULAR | Status: AC
  Filled 2021-04-13: qty 10

## 2021-04-13 MED ORDER — FENTANYL CITRATE (PF) 250 MCG/5ML IJ SOLN
INTRAMUSCULAR | Status: DC | PRN
  Administered 2021-04-13: 50 ug via INTRAVENOUS
  Administered 2021-04-13: 25 ug via INTRAVENOUS
  Administered 2021-04-13: 50 ug via INTRAVENOUS
  Administered 2021-04-13 (×2): 30 ug via INTRAVENOUS
  Administered 2021-04-13: 20 ug via INTRAVENOUS
  Administered 2021-04-13: 25 ug via INTRAVENOUS
  Administered 2021-04-13: 20 ug via INTRAVENOUS

## 2021-04-13 MED ORDER — PROPOFOL 10 MG/ML IV BOLUS
INTRAVENOUS | Status: DC | PRN
  Administered 2021-04-13: 150 mg via INTRAVENOUS

## 2021-04-13 MED ORDER — LIDOCAINE HCL 1 % IJ SOLN
INTRAMUSCULAR | Status: AC
  Filled 2021-04-13: qty 20

## 2021-04-13 MED ORDER — FENTANYL CITRATE (PF) 250 MCG/5ML IJ SOLN
INTRAMUSCULAR | Status: AC
  Filled 2021-04-13: qty 5

## 2021-04-13 MED ORDER — CEFAZOLIN IN SODIUM CHLORIDE 3-0.9 GM/100ML-% IV SOLN
3.0000 g | INTRAVENOUS | Status: AC
  Administered 2021-04-13: 3 g via INTRAVENOUS
  Filled 2021-04-13: qty 100

## 2021-04-13 MED ORDER — HEPARIN 6000 UNIT IRRIGATION SOLUTION
Status: DC | PRN
  Administered 2021-04-13: 500

## 2021-04-13 MED ORDER — HYDROCODONE-ACETAMINOPHEN 5-325 MG PO TABS
1.0000 | ORAL_TABLET | Freq: Four times a day (QID) | ORAL | 0 refills | Status: DC | PRN
Start: 2021-04-13 — End: 2021-08-11

## 2021-04-13 MED FILL — Heparin Sodium (Porcine) Inj 1000 Unit/ML: Qty: 500 | Status: AC

## 2021-04-13 SURGICAL SUPPLY — 42 items
ADH SKN CLS APL DERMABOND .7 (GAUZE/BANDAGES/DRESSINGS) ×2
AGENT HMST SPONGE THK3/8 (HEMOSTASIS)
ARMBAND PINK RESTRICT EXTREMIT (MISCELLANEOUS) ×3 IMPLANT
BAG COUNTER SPONGE SURGICOUNT (BAG) ×3 IMPLANT
BAG SPNG CNTER NS LX DISP (BAG) ×1
CANISTER SUCT 3000ML PPV (MISCELLANEOUS) ×3 IMPLANT
CLIP VESOCCLUDE MED 24/CT (CLIP) ×3 IMPLANT
CLIP VESOCCLUDE SM WIDE 24/CT (CLIP) ×3 IMPLANT
COVER PROBE W GEL 5X96 (DRAPES) ×3 IMPLANT
DECANTER SPIKE VIAL GLASS SM (MISCELLANEOUS) ×3 IMPLANT
DERMABOND ADVANCED (GAUZE/BANDAGES/DRESSINGS) ×2
DERMABOND ADVANCED .7 DNX12 (GAUZE/BANDAGES/DRESSINGS) ×2 IMPLANT
ELECT REM PT RETURN 9FT ADLT (ELECTROSURGICAL) ×2
ELECTRODE REM PT RTRN 9FT ADLT (ELECTROSURGICAL) ×2 IMPLANT
GLOVE SRG 8 PF TXTR STRL LF DI (GLOVE) ×2 IMPLANT
GLOVE SURG ENC MOIS LTX SZ7.5 (GLOVE) ×3 IMPLANT
GLOVE SURG UNDER POLY LF SZ8 (GLOVE) ×2
GOWN STRL REUS W/ TWL LRG LVL3 (GOWN DISPOSABLE) ×4 IMPLANT
GOWN STRL REUS W/ TWL XL LVL3 (GOWN DISPOSABLE) ×4 IMPLANT
GOWN STRL REUS W/TWL LRG LVL3 (GOWN DISPOSABLE) ×4
GOWN STRL REUS W/TWL XL LVL3 (GOWN DISPOSABLE) ×2
HEMOSTAT SPONGE AVITENE ULTRA (HEMOSTASIS) IMPLANT
KIT BASIN OR (CUSTOM PROCEDURE TRAY) ×3 IMPLANT
KIT TURNOVER KIT B (KITS) ×3 IMPLANT
NDL HYPO 25GX1X1/2 BEV (NEEDLE) ×2 IMPLANT
NEEDLE HYPO 25GX1X1/2 BEV (NEEDLE) ×2 IMPLANT
NS IRRIG 1000ML POUR BTL (IV SOLUTION) ×3 IMPLANT
PACK CV ACCESS (CUSTOM PROCEDURE TRAY) ×3 IMPLANT
PAD ARMBOARD 7.5X6 YLW CONV (MISCELLANEOUS) ×6 IMPLANT
SLING ARM FOAM STRAP LRG (SOFTGOODS) IMPLANT
SLING ARM FOAM STRAP MED (SOFTGOODS) IMPLANT
SUT MNCRL AB 4-0 PS2 18 (SUTURE) ×5 IMPLANT
SUT PROLENE 6 0 BV (SUTURE) ×5 IMPLANT
SUT PROLENE 7 0 BV 1 (SUTURE) IMPLANT
SUT SILK 2 0 SH (SUTURE) ×1 IMPLANT
SUT VIC AB 2-0 CT1 27 (SUTURE)
SUT VIC AB 2-0 CT1 TAPERPNT 27 (SUTURE) ×2 IMPLANT
SUT VIC AB 3-0 SH 27 (SUTURE) ×6
SUT VIC AB 3-0 SH 27X BRD (SUTURE) ×4 IMPLANT
TOWEL GREEN STERILE (TOWEL DISPOSABLE) ×3 IMPLANT
UNDERPAD 30X36 HEAVY ABSORB (UNDERPADS AND DIAPERS) ×3 IMPLANT
WATER STERILE IRR 1000ML POUR (IV SOLUTION) ×3 IMPLANT

## 2021-04-13 NOTE — Discharge Instructions (Signed)
? ?  [facility] ? ?Discharge Instructions ? ?AV Fistula or Graft Surgery for Dialysis Access ? ?Please refer to the following instructions for your post-procedure care. Your surgeon or physician assistant will discuss any changes with you. ? ?Activity ? ?You may drive the day following your surgery, if you are comfortable and no longer taking prescription pain medication. Resume full activity as the soreness in your incision resolves. ? ?Bathing/Showering ? ?You may shower after you go home. Keep your incision dry for 48 hours. Do not soak in a bathtub, hot tub, or swim until the incision heals completely. You may not shower if you have a hemodialysis catheter. ? ?Incision Care ? ?Clean your incision with mild soap and water after 48 hours. Pat the area dry with a clean towel. You do not need a bandage unless otherwise instructed. Do not apply any ointments or creams to your incision. You may have skin glue on your incision. Do not peel it off. It will come off on its own in about one week. Your arm may swell a bit after surgery. To reduce swelling use pillows to elevate your arm so it is above your heart. Your doctor will tell you if you need to lightly wrap your arm with an ACE bandage. ? ?Diet ? ?Resume your normal diet. There are not special food restrictions following this procedure. In order to heal from your surgery, it is CRITICAL to get adequate nutrition. Your body requires vitamins, minerals, and protein. Vegetables are the best source of vitamins and minerals. Vegetables also provide the perfect balance of protein. Processed food has little nutritional value, so try to avoid this. ? ?Medications ? ?Resume taking all of your medications. If your incision is causing pain, you may take over-the counter pain relievers such as acetaminophen (Tylenol). If you were prescribed a stronger pain medication, please be aware these medications can cause nausea and constipation. Prevent  nausea by taking the medication with a snack or meal. Avoid constipation by drinking plenty of fluids and eating foods with high amount of fiber, such as fruits, vegetables, and grains.  ?Do not take Tylenol if you are taking prescription pain medications. ? ?Follow up ?Your surgeon may want to see you in the office following your access surgery. If so, this will be arranged at the time of your surgery. ? ?Please call us immediately for any of the following conditions: ? ?Increased pain, redness, drainage (pus) from your incision site ?Fever of 101 degrees or higher ?Severe or worsening pain at your incision site ?Hand pain or numbness. ? ?Reduce your risk of vascular disease: ? ?Stop smoking. If you would like help, call [facility] at 1-800-QUIT-NOW (239)307-8692) or [place] at (715) 306-6481 ? ?Manage your cholesterol ?Maintain a desired weight ?Control your diabetes ?Keep your blood pressure down ? ?Dialysis ? ?It will take several weeks to several months for your new dialysis access to be ready for use. Your surgeon will determine when it is okay to use it. Your nephrologist will continue to direct your dialysis. You can continue to use your Permcath until your new access is ready for use. ? ? ?04/13/2021 ?[name] ?[id number] ?1975-07-01 ? ?Surgeon(s): ?Marty Heck, MD ? ?Procedure(s): ?LEFT ARM SECOND STAGE BASILIC VEIN TRANSPOSITION ? ?x Do not stick fistula for 8 weeks  ? ? ?If you have any questions, please call the office at [phone]. ? ?

## 2021-04-13 NOTE — Anesthesia Preprocedure Evaluation (Addendum)
Anesthesia Evaluation  ?Patient identified by MRN, date of birth, ID band ?Patient awake ? ? ? ?Reviewed: ?Allergy & Precautions, H&P , NPO status , Patient's Chart, lab work & pertinent test results ? ?Airway ?Mallampati: III ? ?TM Distance: >3 FB ?Neck ROM: Full ? ? ? Dental ?no notable dental hx. ?(+) Teeth Intact, Dental Advisory Given ?  ?Pulmonary ?neg pulmonary ROS,  ?  ?Pulmonary exam normal ?breath sounds clear to auscultation ? ? ? ? ? ? Cardiovascular ?hypertension, Pt. on medications ?+ Peripheral Vascular Disease and +CHF  ? ?Rhythm:Regular Rate:Normal ? ? ?  ?Neuro/Psych ? Headaches, Anxiety   ? GI/Hepatic ?negative GI ROS, Neg liver ROS,   ?Endo/Other  ?negative endocrine ROSMorbid obesity ? Renal/GU ?ESRF and DialysisRenal disease  ?negative genitourinary ?  ?Musculoskeletal ? ?(+) Arthritis , Osteoarthritis,   ? Abdominal ?  ?Peds ? Hematology ? ?(+) Blood dyscrasia, anemia ,   ?Anesthesia Other Findings ? ? Reproductive/Obstetrics ?negative OB ROS ? ?  ? ? ? ? ? ? ? ? ? ? ? ? ? ?  ?  ? ? ? ? ? ? ? ?Anesthesia Physical ?Anesthesia Plan ? ?ASA: 3 ? ?Anesthesia Plan: General  ? ?Post-op Pain Management: Tylenol PO (pre-op)*  ? ?Induction: Intravenous ? ?PONV Risk Score and Plan: 4 or greater and Ondansetron, Dexamethasone and Midazolam ? ?Airway Management Planned: LMA ? ?Additional Equipment:  ? ?Intra-op Plan:  ? ?Post-operative Plan: Extubation in OR ? ?Informed Consent: I have reviewed the patients History and Physical, chart, labs and discussed the procedure including the risks, benefits and alternatives for the proposed anesthesia with the patient or authorized representative who has indicated his/her understanding and acceptance.  ? ? ? ?Dental advisory given ? ?Plan Discussed with: CRNA ? ?Anesthesia Plan Comments:   ? ? ? ? ? ? ?Anesthesia Quick Evaluation ? ?

## 2021-04-13 NOTE — H&P (Signed)
History and Physical Interval Note: ? ?04/13/2021 ?7:43 AM ? ?[name]  has presented today for surgery, with the diagnosis of ESRD.  The various methods of treatment have been discussed with the patient and family. After consideration of risks, benefits and other options for treatment, the patient has consented to  Procedure(s): ?LEFT ARM SECOND STAGE BASILIC VEIN TRANSPOSITION (Left) as a surgical intervention.  The patient's history has been reviewed, patient examined, no change in status, stable for surgery.  I have reviewed the patient's chart and labs.  Questions were answered to the patient's satisfaction.   ? ? ?Marty Heck ? ?Patient name: [name]     MRN: [id number]        DOB: August 24, 1975            Sex: female ?  ?REASON FOR VISIT: Follow-up after left first stage basilic vein fistula ?  ?HPI: ?[name] is a 46 y.o. female with hx ESRD on HD M/W/F, HTN, HLD, morbid obesity that presents for hospital follow-up after left first stage basilic vein fistula on 0/62/6948.  She states her incision is healing.  She can feel the thrill.  She has no concerns today.  No signs of steal. ?  ?She has a complex history of previous AV fistulas including a right brachiocephalic in 5462, left brachiocephalic in 7035, thrombectomy of left brachial artery with a bovine patch and ligation of the left brachiocephalic fistula for a thromboembolic event, right basilic, and right upper extremity AV graft with multiple revisions. ?  ?  ?    ?Past Medical History:  ?Diagnosis Date  ? Anemia    ? Anxiety    ?  pt not aware of this  ? Arthritis    ? Breast mass 09/01/2016  ?  Targeted ultrasound is performed, showing a mass in the left breast at 11 o'clock, 1 cm from the nipple measuring 6.4 by 5.0 cm. The mass is heterogeneous and may contain macroscopic fat.   IMPRESSION: The mass in the left breast is probably benign, possibly a Hamartoma.  RECOMMENDATION: Recommend six-month follow-up mammogram of  the probably benign left breast mass. If necessary, an ultrasound co  ? CHF (congestive heart failure) ([place])    ?  hosp. 2011  ? Chronic kidney disease    ?  Mon Wed Friday  ? Dilated cardiomyopathy ([place])    ? Encounter for blood transfusion    ? Family history of adverse reaction to anesthesia    ?   " Aunt has a tracheostomy and kept coughing after colonoscopy "  ? Foot fracture    ? Gout    ?  no flare up for several yrs  ? Heart murmur    ?  as a child, no problems as an adult  ? History of blood transfusion    ?  x 2  ? History of pneumonia    ? Hyperlipidemia    ? Hypertension    ? Menorrhagia    ? Morbid obesity ([place])    ? Pneumonia    ?  hosp. 2010  ? Shortness of breath    ?  HX CHL - occasional  ? Syncope 01/27/2013  ? SYSTOLIC HEART FAILURE, ACUTE 04/20/2008  ?  Qualifier: Diagnosis of  By: Ronne Binning    ? 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 Thyroid disease    ?  ?  ?     ?Past Surgical History:  ?  Procedure Laterality Date  ? ANGIOPLASTY   07/28/2015  ?  Procedure: ANGIOPLASTY OF RIGHT BRACHIAL VEIN;  Surgeon: Conrad [place], MD;  Location: [place];  Service: Vascular;;  ? AV FISTULA PLACEMENT   08/04/2011  ?  Procedure: ARTERIOVENOUS (AV) FISTULA CREATION;  Surgeon: Elam Dutch, MD;  [facility] OR;  Service: Vascular;  Laterality: Right;  Creation right brachiocephalic arteriovenous fistula  ? AV FISTULA PLACEMENT Left 02/05/2013  ?  Procedure: ARTERIOVENOUS FISTULA CREATION LEFT ARM;  Surgeon: Conrad [place], MD;  Location: [place];  Service: Vascular;  Laterality: Left;  ? AV FISTULA PLACEMENT Right 06/04/2013  ?  Procedure: ARTERIOVENOUS (AV) FISTULA CREATION;  Surgeon: Conrad [place], MD;  Location: [place];  Service: Vascular;  Laterality: Right;  ? AV FISTULA PLACEMENT Right 04/15/2014  ?  Procedure: INSERTION OF ARTERIOVENOUS (AV) GORE-TEX GRAFT ARM;  Surgeon: Conrad [place], MD;  Location: [place];  Service: Vascular;  Laterality: Right;  ? AV FISTULA PLACEMENT Left 02/25/2021  ?   Procedure: LEFT ARM ARTERIOVENOUS (AV) FISTULA CREATION;  Surgeon: Marty Heck, MD;  Location: [place];  Service: Vascular;  Laterality: Left;  ? BASCILIC VEIN TRANSPOSITION Right 08/13/2013  ?  Procedure: Second Stage Brachial Vein Transposition;  Surgeon: Conrad [place], MD;  Location: [place];  Service: Vascular;  Laterality: Right;  ? [place]  ?  repair of hole in heart  ? INCISION AND DRAINAGE OF WOUND Right 07/28/2015  ?  Procedure: IRRIGATION AND DEBRIDEMENT RIGHT HAND  WOUND;  Surgeon: Dayna Barker, MD;  Location: [place];  Service: Plastics;  Laterality: Right;  ? LIGATION OF ARTERIOVENOUS  FISTULA Left 03/05/2013  ?  Procedure: LIGATION OF ARTERIOVENOUS  FISTULA;  Surgeon: Conrad [place]arden, MD;  Location: [place];  Service: Vascular;  Laterality: Left;  ? PATCH ANGIOPLASTY Left 03/05/2013  ?  Procedure: PATCH ANGIOPLASTY;  Surgeon: Conrad Pickens, MD;  Location: [place];  Service: Vascular;  Laterality: Left;  ? PERIPHERAL VASCULAR CATHETERIZATION Right 10/31/2014  ?  Procedure: Fistulagram;  Surgeon: Conrad [place], MD;  Location: [place] CV LAB;  Service: Cardiovascular;  Laterality: Right;  ? PERIPHERAL VASCULAR CATHETERIZATION Right 10/31/2014  ?  Procedure: Peripheral Vascular Balloon Angioplasty;  Surgeon: Conrad [place], MD;  Location: [place] CV LAB;  Service: Cardiovascular;  Laterality: Right;  pta venous rt arm  ? PERIPHERAL VASCULAR CATHETERIZATION N/A 03/13/2015  ?  Procedure: A/V Shuntogram;  Surgeon: Conrad [place]don, MD;  Location: [place] [facility] LAB;  Service: Cardiovascular;  Laterality: N/A;  ? PERIPHERAL VASCULAR CATHETERIZATION Right 03/13/2015  ?  Procedure: Peripheral Vascular Balloon Angioplasty;  Surgeon: Conrad Wall, MD;  Location: [place] CV LAB;  Service: Cardiovascular;  Laterality: Right;  Shunt  ? PERIPHERAL VASCULAR CATHETERIZATION Right 01/19/2016  ?  Procedure: A/V Shuntogram;  Surgeon: Angelia Mould, MD;  Location: [place] CV LAB;  Service: Cardiovascular;   Laterality: Right;  ? REVISION OF ARTERIOVENOUS GORETEX GRAFT Right 10/27/2015  ?  Procedure: THROMBECTOMY AND REVISION OF RIGHT UPPER ARM  ARTERIOVENOUS GORETEX GRAFT USING 6MM X 10CM GORE-TEX GRAFT;  Surgeon: Elam Dutch, MD;  Location: [place];  Service: Vascular;  Laterality: Right;  ? REVISION OF ARTERIOVENOUS GORETEX GRAFT Right 01/30/2016  ?  Procedure: REVISION OF RIGHT UPPER ARM ARTERIOVENOUS USING GORETEX GRAFT;  Surgeon: Angelia Mould, MD;  Location: [place];  Service: Vascular;  Laterality: Right;  ? THROMBECTOMY W/ EMBOLECTOMY Left  03/05/2013  ?  Procedure: THROMBECTOMY OF LEFT BRACHIAL ARTERY;  Surgeon: Conrad [place], MD;  Location: [place];  Service: Vascular;  Laterality: Left;  ? VENOGRAM Bilateral 03/25/2014  ?  Procedure: VENOGRAM bilateral;  Surgeon: Conrad Roberts, MD;  Location: [facility] CATH LAB;  Service: Cardiovascular;  Laterality: Bilateral;  ?  ?  ?     ?Family History  ?Problem Relation Age of Onset  ? Diabetes Mother    ? Hyperlipidemia Mother    ? Hypertension Mother    ? Kidney disease Mother    ? Anesthesia problems Neg Hx    ? Hypotension Neg Hx    ? Malignant hyperthermia Neg Hx    ? Pseudochol deficiency Neg Hx    ?  ?  ?SOCIAL HISTORY: ?Social History  ?  ?     ?Tobacco Use  ? Smoking status: Never  ? Smokeless tobacco: Never  ?Substance Use Topics  ? Alcohol use: No  ?    Alcohol/week: 0.0 standard drinks  ?  ?  ?     ?Allergies  ?Allergen Reactions  ? Pollen Extract Other (See Comments)  ?    Unknown  ? Oxycodone-Acetaminophen Itching  ?    Tolerates with benadryl   ?  ?  ?      ?Current Outpatient Medications  ?Medication Sig Dispense Refill  ? AURYXIA 1 GM 210 MG(Fe) tablet Take 630 mg by mouth See admin instructions. Take 3 tablets (630 mg) by mouth with meals and snacks      ? cinacalcet (SENSIPAR) 60 MG tablet Take 60 mg by mouth in the morning.      ? megestrol (MEGACE) 20 MG tablet Take 2 tablets (40 mg total) by mouth daily. 60 tablet 11  ? PROAIR HFA 108 (90 Base) MCG/ACT  inhaler Inhale 1-2 puffs into the lungs 3 (three) times daily as needed for wheezing or shortness of breath. For wheezing.   2  ? HYDROcodone-acetaminophen (NORCO/VICODIN) 5-325 MG tablet Take 1 tablet

## 2021-04-13 NOTE — Anesthesia Postprocedure Evaluation (Signed)
Anesthesia Post Note ? ?Patient: [name] ? ?Procedure(s) Performed: LEFT ARM SECOND STAGE BASILIC VEIN TRANSPOSITION (Left) ? ?  ? ?Patient location during evaluation: PACU ?Anesthesia Type: General ?Level of consciousness: awake and alert ?Pain management: pain level controlled ?Vital Signs Assessment: post-procedure vital signs reviewed and stable ?Respiratory status: spontaneous breathing, nonlabored ventilation and respiratory function stable ?Cardiovascular status: blood pressure returned to baseline and stable ?Postop Assessment: no apparent nausea or vomiting ?Anesthetic complications: no ? ? ?No notable events documented. ? ?Last Vitals:  ?Vitals:  ? 04/13/21 1130 04/13/21 1145  ?BP: (!) 152/94 (!) 165/102  ?Pulse: 86 85  ?Resp: 16 17  ?Temp:  36.8 ?C  ?SpO2: 100% 100%  ?  ?Last Pain:  ?Vitals:  ? 04/13/21 1130  ?TempSrc:   ?PainSc: Asleep  ? ? ?  ?  ?  ?  ?  ?  ? ?Anil Havard,W. EDMOND ? ? ? ? ?

## 2021-04-13 NOTE — Transfer of Care (Signed)
Immediate Anesthesia Transfer of Care Note ? ?Patient: [name] ? ?Procedure(s) Performed: LEFT ARM SECOND STAGE BASILIC VEIN TRANSPOSITION (Left) ? ?Patient Location: PACU ? ?Anesthesia Type:General ? ?Level of Consciousness: awake, alert  and oriented ? ?Airway & Oxygen Therapy: Patient Spontanous Breathing ? ?Post-op Assessment: Report given to RN, Post -op Vital signs reviewed and stable, Patient moving all extremities X 4 and Patient able to stick tongue midline ? ?Post vital signs: Reviewed ? ?Last Vitals:  ?Vitals Value Taken Time  ?BP 152/91   ?Temp 97.6   ?Pulse 45 04/13/21 1115  ?Resp 21 04/13/21 1115  ?SpO2 94 % 04/13/21 1115  ?Vitals shown include unvalidated device data. ? ?Last Pain:  ?Vitals:  ? 04/13/21 0727  ?TempSrc:   ?PainSc: 0-No pain  ?   ? ?Patients Stated Pain Goal: 0 (04/13/21 0727) ? ?Complications: No notable events documented. ?

## 2021-04-13 NOTE — Anesthesia Procedure Notes (Addendum)
Procedure Name: LMA Insertion ?Date/Time: 04/13/2021 9:16 AM ?Performed by: Maude Leriche, CRNA ?Pre-anesthesia Checklist: Patient identified, Emergency Drugs available, Suction available and Patient being monitored ?Patient Re-evaluated:Patient Re-evaluated prior to induction ?Oxygen Delivery Method: Circle system utilized ?Preoxygenation: Pre-oxygenation with 100% oxygen ?Induction Type: IV induction ?Ventilation: Mask ventilation without difficulty ?LMA: LMA inserted ?LMA Size: 4.0 ?Tube type: Oral ?Number of attempts: 1 ?Airway Equipment and Method: Stylet ?Placement Confirmation: ETT inserted through vocal cords under direct vision, positive ETCO2 and breath sounds checked- equal and bilateral ?Tube secured with: Tape ?Dental Injury: Teeth and Oropharynx as per pre-operative assessment  ? ? ? ? ?

## 2021-04-13 NOTE — Op Note (Addendum)
? ? ?  OPERATIVE NOTE ? ? ?PROCEDURE: left second stage basilic vein transposition (brachiobasilic arteriovenous fistula) placement ? ?PRE-OPERATIVE DIAGNOSIS: End stage renal disease ? ?POST-OPERATIVE DIAGNOSIS: same ? ?SURGEON: Marty Heck, MD ? ?ASSISTANT(S): Leontine Locket, PA ? ?ANESTHESIA:  LMA ? ?ESTIMATED BLOOD LOSS: <75 mL ? ?FINDING(S): ?Left arm basilic vein was of excellent caliber.  This was fully mobilized through three skip incisions in the left upper arm.  It was then transposed through a new skin tunnel after it was transected near the antecubital fossa and a new end-to-end anastomosis was sewn.  Excellent thrill at completion.  Palpable radial pulse at completion ? ?SPECIMEN(S):  None ? ?INDICATIONS:   ?[name] is a 46 y.o. female who presents with end stage renal disease and need for permanent hemodialysis access.  The patient is scheduled for left second stage basilic vein transposition.  The patient is aware the risks include but are not limited to: bleeding, infection, steal syndrome, nerve damage, ischemic monomelic neuropathy, failure to mature, and need for additional procedures.  The patient is aware of the risks of the procedure and elects to proceed forward.  An assistant was needed for exposure and to expedite the case. ? ? ?DESCRIPTION: ?After full informed written consent was obtained from the patient, the patient was brought back to the operating room and placed supine upon the operating table.  Prior to induction, the patient received IV antibiotics.   After obtaining adequate anesthesia, the patient was then prepped and draped in the standard fashion for a left arm access procedure.  I turned my attention first to identifying the patient's brachiobasilic arteriovenous fistula.  Using SonoSite guidance, the location of this fistula was marked out on the skin.    This was an excellent caliber vein.  I made three longitudinal incisions on the medial aspect of the left  upper arm.  Through these incisions, I dissected out circumferentially the basilic vein, taking care to protect the nerve.  Once the vein was fully mobilized, all side branches were ligated between silk ties.  The vein was marked for orientation.  I then used a curved tunneler to create a subcutaneous tunnel.  The patient was given 5,000 units IV heparin.  The vein was then transected near the antecubital crease.  It was then brought to the previously created tunnel making sure to maintain proper orientation.  A primary anastomosis was then performed between the two cut ends of the vein with a running 6-0 Prolene using the help of my assistant.  Once this was done the clamps were released.  There was excellent flow through the fistula.  Hemostasis was then achieved.  The wound was irrigated.  The incision was closed with a deep layer of 3-0 Vicryl followed by a subcutaneous 4-0 Monocryl and Dermabond.  There were no immediate complications. ? ?COMPLICATIONS: None ? ?CONDITION: Stable ? ?Marty Heck, MD ?[facility] ?Office: [phone] ? ?Marty Heck ? ? ?04/13/2021, 11:16 AM ? ? ? ?  ?

## 2021-04-14 ENCOUNTER — Encounter (HOSPITAL_COMMUNITY): Payer: Self-pay | Admitting: Vascular Surgery

## 2021-04-15 DIAGNOSIS — T8249XA Other complication of vascular dialysis catheter, initial encounter: Secondary | ICD-10-CM | POA: Diagnosis not present

## 2021-04-15 DIAGNOSIS — N186 End stage renal disease: Secondary | ICD-10-CM | POA: Diagnosis not present

## 2021-04-15 DIAGNOSIS — D509 Iron deficiency anemia, unspecified: Secondary | ICD-10-CM | POA: Diagnosis not present

## 2021-04-15 DIAGNOSIS — N2581 Secondary hyperparathyroidism of renal origin: Secondary | ICD-10-CM | POA: Diagnosis not present

## 2021-04-15 DIAGNOSIS — T8249XS Other complication of vascular dialysis catheter, sequela: Secondary | ICD-10-CM | POA: Diagnosis not present

## 2021-04-15 DIAGNOSIS — D689 Coagulation defect, unspecified: Secondary | ICD-10-CM | POA: Diagnosis not present

## 2021-04-15 DIAGNOSIS — Z992 Dependence on renal dialysis: Secondary | ICD-10-CM | POA: Diagnosis not present

## 2021-04-15 DIAGNOSIS — L299 Pruritus, unspecified: Secondary | ICD-10-CM | POA: Diagnosis not present

## 2021-04-17 DIAGNOSIS — D509 Iron deficiency anemia, unspecified: Secondary | ICD-10-CM | POA: Diagnosis not present

## 2021-04-17 DIAGNOSIS — L299 Pruritus, unspecified: Secondary | ICD-10-CM | POA: Diagnosis not present

## 2021-04-17 DIAGNOSIS — N2581 Secondary hyperparathyroidism of renal origin: Secondary | ICD-10-CM | POA: Diagnosis not present

## 2021-04-17 DIAGNOSIS — N186 End stage renal disease: Secondary | ICD-10-CM | POA: Diagnosis not present

## 2021-04-17 DIAGNOSIS — D689 Coagulation defect, unspecified: Secondary | ICD-10-CM | POA: Diagnosis not present

## 2021-04-17 DIAGNOSIS — T8249XS Other complication of vascular dialysis catheter, sequela: Secondary | ICD-10-CM | POA: Diagnosis not present

## 2021-04-17 DIAGNOSIS — Z992 Dependence on renal dialysis: Secondary | ICD-10-CM | POA: Diagnosis not present

## 2021-04-17 DIAGNOSIS — T8249XA Other complication of vascular dialysis catheter, initial encounter: Secondary | ICD-10-CM | POA: Diagnosis not present

## 2021-04-20 DIAGNOSIS — D689 Coagulation defect, unspecified: Secondary | ICD-10-CM | POA: Diagnosis not present

## 2021-04-20 DIAGNOSIS — N2581 Secondary hyperparathyroidism of renal origin: Secondary | ICD-10-CM | POA: Diagnosis not present

## 2021-04-20 DIAGNOSIS — D509 Iron deficiency anemia, unspecified: Secondary | ICD-10-CM | POA: Diagnosis not present

## 2021-04-20 DIAGNOSIS — L299 Pruritus, unspecified: Secondary | ICD-10-CM | POA: Diagnosis not present

## 2021-04-20 DIAGNOSIS — Z992 Dependence on renal dialysis: Secondary | ICD-10-CM | POA: Diagnosis not present

## 2021-04-20 DIAGNOSIS — T8249XS Other complication of vascular dialysis catheter, sequela: Secondary | ICD-10-CM | POA: Diagnosis not present

## 2021-04-20 DIAGNOSIS — N186 End stage renal disease: Secondary | ICD-10-CM | POA: Diagnosis not present

## 2021-04-20 DIAGNOSIS — T8249XA Other complication of vascular dialysis catheter, initial encounter: Secondary | ICD-10-CM | POA: Diagnosis not present

## 2021-04-22 DIAGNOSIS — D689 Coagulation defect, unspecified: Secondary | ICD-10-CM | POA: Diagnosis not present

## 2021-04-22 DIAGNOSIS — T8249XA Other complication of vascular dialysis catheter, initial encounter: Secondary | ICD-10-CM | POA: Diagnosis not present

## 2021-04-22 DIAGNOSIS — Z992 Dependence on renal dialysis: Secondary | ICD-10-CM | POA: Diagnosis not present

## 2021-04-22 DIAGNOSIS — N2581 Secondary hyperparathyroidism of renal origin: Secondary | ICD-10-CM | POA: Diagnosis not present

## 2021-04-22 DIAGNOSIS — L299 Pruritus, unspecified: Secondary | ICD-10-CM | POA: Diagnosis not present

## 2021-04-22 DIAGNOSIS — T8249XS Other complication of vascular dialysis catheter, sequela: Secondary | ICD-10-CM | POA: Diagnosis not present

## 2021-04-22 DIAGNOSIS — D509 Iron deficiency anemia, unspecified: Secondary | ICD-10-CM | POA: Diagnosis not present

## 2021-04-22 DIAGNOSIS — N186 End stage renal disease: Secondary | ICD-10-CM | POA: Diagnosis not present

## 2021-04-27 DIAGNOSIS — T8249XA Other complication of vascular dialysis catheter, initial encounter: Secondary | ICD-10-CM | POA: Diagnosis not present

## 2021-04-27 DIAGNOSIS — D689 Coagulation defect, unspecified: Secondary | ICD-10-CM | POA: Diagnosis not present

## 2021-04-27 DIAGNOSIS — T8249XS Other complication of vascular dialysis catheter, sequela: Secondary | ICD-10-CM | POA: Diagnosis not present

## 2021-04-27 DIAGNOSIS — L299 Pruritus, unspecified: Secondary | ICD-10-CM | POA: Diagnosis not present

## 2021-04-27 DIAGNOSIS — N186 End stage renal disease: Secondary | ICD-10-CM | POA: Diagnosis not present

## 2021-04-27 DIAGNOSIS — D509 Iron deficiency anemia, unspecified: Secondary | ICD-10-CM | POA: Diagnosis not present

## 2021-04-27 DIAGNOSIS — Z992 Dependence on renal dialysis: Secondary | ICD-10-CM | POA: Diagnosis not present

## 2021-04-27 DIAGNOSIS — N2581 Secondary hyperparathyroidism of renal origin: Secondary | ICD-10-CM | POA: Diagnosis not present

## 2021-04-29 DIAGNOSIS — T8249XS Other complication of vascular dialysis catheter, sequela: Secondary | ICD-10-CM | POA: Diagnosis not present

## 2021-04-29 DIAGNOSIS — N2581 Secondary hyperparathyroidism of renal origin: Secondary | ICD-10-CM | POA: Diagnosis not present

## 2021-04-29 DIAGNOSIS — L299 Pruritus, unspecified: Secondary | ICD-10-CM | POA: Diagnosis not present

## 2021-04-29 DIAGNOSIS — D509 Iron deficiency anemia, unspecified: Secondary | ICD-10-CM | POA: Diagnosis not present

## 2021-04-29 DIAGNOSIS — D689 Coagulation defect, unspecified: Secondary | ICD-10-CM | POA: Diagnosis not present

## 2021-04-29 DIAGNOSIS — T8249XA Other complication of vascular dialysis catheter, initial encounter: Secondary | ICD-10-CM | POA: Diagnosis not present

## 2021-04-29 DIAGNOSIS — Z992 Dependence on renal dialysis: Secondary | ICD-10-CM | POA: Diagnosis not present

## 2021-04-29 DIAGNOSIS — N186 End stage renal disease: Secondary | ICD-10-CM | POA: Diagnosis not present

## 2021-05-03 DIAGNOSIS — N186 End stage renal disease: Secondary | ICD-10-CM | POA: Diagnosis not present

## 2021-05-03 DIAGNOSIS — Z992 Dependence on renal dialysis: Secondary | ICD-10-CM | POA: Diagnosis not present

## 2021-05-03 DIAGNOSIS — N041 Nephrotic syndrome with focal and segmental glomerular lesions: Secondary | ICD-10-CM | POA: Diagnosis not present

## 2021-05-04 DIAGNOSIS — T8249XS Other complication of vascular dialysis catheter, sequela: Secondary | ICD-10-CM | POA: Diagnosis not present

## 2021-05-04 DIAGNOSIS — L299 Pruritus, unspecified: Secondary | ICD-10-CM | POA: Diagnosis not present

## 2021-05-04 DIAGNOSIS — N186 End stage renal disease: Secondary | ICD-10-CM | POA: Diagnosis not present

## 2021-05-04 DIAGNOSIS — D689 Coagulation defect, unspecified: Secondary | ICD-10-CM | POA: Diagnosis not present

## 2021-05-04 DIAGNOSIS — D509 Iron deficiency anemia, unspecified: Secondary | ICD-10-CM | POA: Diagnosis not present

## 2021-05-04 DIAGNOSIS — Z992 Dependence on renal dialysis: Secondary | ICD-10-CM | POA: Diagnosis not present

## 2021-05-04 DIAGNOSIS — N2581 Secondary hyperparathyroidism of renal origin: Secondary | ICD-10-CM | POA: Diagnosis not present

## 2021-05-04 DIAGNOSIS — T8249XA Other complication of vascular dialysis catheter, initial encounter: Secondary | ICD-10-CM | POA: Diagnosis not present

## 2021-05-05 ENCOUNTER — Ambulatory Visit (INDEPENDENT_AMBULATORY_CARE_PROVIDER_SITE_OTHER): Payer: Medicare Other | Admitting: Physician Assistant

## 2021-05-05 VITALS — BP 138/89 | HR 97 | Temp 97.9°F | Resp 18 | Ht 67.0 in | Wt 315.9 lb

## 2021-05-05 DIAGNOSIS — Z992 Dependence on renal dialysis: Secondary | ICD-10-CM

## 2021-05-05 DIAGNOSIS — N186 End stage renal disease: Secondary | ICD-10-CM

## 2021-05-05 NOTE — Progress Notes (Signed)
?  POST OPERATIVE OFFICE NOTE ? ? ? ?CC:  F/u for surgery ? ?HPI:  This is a 46 y.o. female who is s/p  left second stage basilic vein transposition on 04/13/21 by Dr. Carlis Abbott.  The first stage was performed on 02/25/21. She is on HD MWF via right TDC placed else where. ? ?Pt returns today for follow up.  Pt states she has no left hand pain, no motor loss and occasionally she has numbness that goes away with movement.  No frank symptoms of steal.  She reports significant edema in the left upper arm post surgery that has decreased with time.   ? ? ?Allergies  ?Allergen Reactions  ? Pollen Extract Other (See Comments)  ?  Unknown  ? Oxycodone-Acetaminophen Itching  ?  Tolerates with benadryl   ? ? ?Current Outpatient Medications  ?Medication Sig Dispense Refill  ? Aspirin-Caffeine (BAYER BACK & BODY) 500-32.5 MG TABS Take 2 tablets by mouth 2 (two) times daily as needed (pain.).    ? AURYXIA 1 GM 210 MG(Fe) tablet Take 630 mg by mouth See admin instructions. Take 3 tablets (630 mg) by mouth with meals and snacks    ? cinacalcet (SENSIPAR) 60 MG tablet Take 60 mg by mouth in the morning.    ? megestrol (MEGACE) 20 MG tablet Take 2 tablets (40 mg total) by mouth daily. 60 tablet 11  ? PROAIR HFA 108 (90 Base) MCG/ACT inhaler Inhale 1-2 puffs into the lungs 3 (three) times daily as needed for wheezing or shortness of breath. For wheezing.  2  ? HYDROcodone-acetaminophen (NORCO/VICODIN) 5-325 MG tablet Take 1 tablet by mouth every 6 (six) hours as needed for moderate pain. 20 tablet 0  ? ?No current facility-administered medications for this visit.  ? ? ? ROS:  See HPI ? ?Physical Exam: ? ? ? ?Incision:  well healed incisions ?Extremities:  Palpable thrill throughout the fistula, radial pulse distally palpable. ?Neuro: sensation and motor intact left UE ? ? ? ? ?Assessment/Plan:  This is a 46 y.o. female who is s/p:second stage basilic fistula creation.  The fistula may be accessed after 05/25/21 for HD.  Once the fistula is  working well the [facility] may be discontinued.  She will f/u as needed in the future. ? ? ? ?Laurence Slate PAC ?[facility] ?[phone] ? ? ?Clinic MD:  Carlis Abbott ?

## 2021-05-06 DIAGNOSIS — D689 Coagulation defect, unspecified: Secondary | ICD-10-CM | POA: Diagnosis not present

## 2021-05-06 DIAGNOSIS — N186 End stage renal disease: Secondary | ICD-10-CM | POA: Diagnosis not present

## 2021-05-06 DIAGNOSIS — L299 Pruritus, unspecified: Secondary | ICD-10-CM | POA: Diagnosis not present

## 2021-05-06 DIAGNOSIS — T8249XS Other complication of vascular dialysis catheter, sequela: Secondary | ICD-10-CM | POA: Diagnosis not present

## 2021-05-06 DIAGNOSIS — Z992 Dependence on renal dialysis: Secondary | ICD-10-CM | POA: Diagnosis not present

## 2021-05-06 DIAGNOSIS — T8249XA Other complication of vascular dialysis catheter, initial encounter: Secondary | ICD-10-CM | POA: Diagnosis not present

## 2021-05-06 DIAGNOSIS — D509 Iron deficiency anemia, unspecified: Secondary | ICD-10-CM | POA: Diagnosis not present

## 2021-05-06 DIAGNOSIS — N2581 Secondary hyperparathyroidism of renal origin: Secondary | ICD-10-CM | POA: Diagnosis not present

## 2021-05-08 DIAGNOSIS — Z992 Dependence on renal dialysis: Secondary | ICD-10-CM | POA: Diagnosis not present

## 2021-05-08 DIAGNOSIS — N186 End stage renal disease: Secondary | ICD-10-CM | POA: Diagnosis not present

## 2021-05-08 DIAGNOSIS — T8249XS Other complication of vascular dialysis catheter, sequela: Secondary | ICD-10-CM | POA: Diagnosis not present

## 2021-05-08 DIAGNOSIS — N2581 Secondary hyperparathyroidism of renal origin: Secondary | ICD-10-CM | POA: Diagnosis not present

## 2021-05-08 DIAGNOSIS — D689 Coagulation defect, unspecified: Secondary | ICD-10-CM | POA: Diagnosis not present

## 2021-05-08 DIAGNOSIS — D509 Iron deficiency anemia, unspecified: Secondary | ICD-10-CM | POA: Diagnosis not present

## 2021-05-08 DIAGNOSIS — T8249XA Other complication of vascular dialysis catheter, initial encounter: Secondary | ICD-10-CM | POA: Diagnosis not present

## 2021-05-08 DIAGNOSIS — L299 Pruritus, unspecified: Secondary | ICD-10-CM | POA: Diagnosis not present

## 2021-05-13 DIAGNOSIS — D509 Iron deficiency anemia, unspecified: Secondary | ICD-10-CM | POA: Diagnosis not present

## 2021-05-13 DIAGNOSIS — N186 End stage renal disease: Secondary | ICD-10-CM | POA: Diagnosis not present

## 2021-05-13 DIAGNOSIS — N2581 Secondary hyperparathyroidism of renal origin: Secondary | ICD-10-CM | POA: Diagnosis not present

## 2021-05-13 DIAGNOSIS — D689 Coagulation defect, unspecified: Secondary | ICD-10-CM | POA: Diagnosis not present

## 2021-05-13 DIAGNOSIS — T8249XA Other complication of vascular dialysis catheter, initial encounter: Secondary | ICD-10-CM | POA: Diagnosis not present

## 2021-05-13 DIAGNOSIS — L299 Pruritus, unspecified: Secondary | ICD-10-CM | POA: Diagnosis not present

## 2021-05-13 DIAGNOSIS — T8249XS Other complication of vascular dialysis catheter, sequela: Secondary | ICD-10-CM | POA: Diagnosis not present

## 2021-05-13 DIAGNOSIS — Z992 Dependence on renal dialysis: Secondary | ICD-10-CM | POA: Diagnosis not present

## 2021-05-15 DIAGNOSIS — N186 End stage renal disease: Secondary | ICD-10-CM | POA: Diagnosis not present

## 2021-05-15 DIAGNOSIS — D509 Iron deficiency anemia, unspecified: Secondary | ICD-10-CM | POA: Diagnosis not present

## 2021-05-15 DIAGNOSIS — T8249XS Other complication of vascular dialysis catheter, sequela: Secondary | ICD-10-CM | POA: Diagnosis not present

## 2021-05-15 DIAGNOSIS — Z992 Dependence on renal dialysis: Secondary | ICD-10-CM | POA: Diagnosis not present

## 2021-05-15 DIAGNOSIS — L299 Pruritus, unspecified: Secondary | ICD-10-CM | POA: Diagnosis not present

## 2021-05-15 DIAGNOSIS — T8249XA Other complication of vascular dialysis catheter, initial encounter: Secondary | ICD-10-CM | POA: Diagnosis not present

## 2021-05-15 DIAGNOSIS — D689 Coagulation defect, unspecified: Secondary | ICD-10-CM | POA: Diagnosis not present

## 2021-05-15 DIAGNOSIS — N2581 Secondary hyperparathyroidism of renal origin: Secondary | ICD-10-CM | POA: Diagnosis not present

## 2021-05-20 DIAGNOSIS — T8249XA Other complication of vascular dialysis catheter, initial encounter: Secondary | ICD-10-CM | POA: Diagnosis not present

## 2021-05-20 DIAGNOSIS — D509 Iron deficiency anemia, unspecified: Secondary | ICD-10-CM | POA: Diagnosis not present

## 2021-05-20 DIAGNOSIS — N186 End stage renal disease: Secondary | ICD-10-CM | POA: Diagnosis not present

## 2021-05-20 DIAGNOSIS — N2581 Secondary hyperparathyroidism of renal origin: Secondary | ICD-10-CM | POA: Diagnosis not present

## 2021-05-20 DIAGNOSIS — T8249XS Other complication of vascular dialysis catheter, sequela: Secondary | ICD-10-CM | POA: Diagnosis not present

## 2021-05-20 DIAGNOSIS — L299 Pruritus, unspecified: Secondary | ICD-10-CM | POA: Diagnosis not present

## 2021-05-20 DIAGNOSIS — D689 Coagulation defect, unspecified: Secondary | ICD-10-CM | POA: Diagnosis not present

## 2021-05-20 DIAGNOSIS — Z992 Dependence on renal dialysis: Secondary | ICD-10-CM | POA: Diagnosis not present

## 2021-05-22 DIAGNOSIS — N2581 Secondary hyperparathyroidism of renal origin: Secondary | ICD-10-CM | POA: Diagnosis not present

## 2021-05-22 DIAGNOSIS — N186 End stage renal disease: Secondary | ICD-10-CM | POA: Diagnosis not present

## 2021-05-22 DIAGNOSIS — D509 Iron deficiency anemia, unspecified: Secondary | ICD-10-CM | POA: Diagnosis not present

## 2021-05-22 DIAGNOSIS — Z992 Dependence on renal dialysis: Secondary | ICD-10-CM | POA: Diagnosis not present

## 2021-05-22 DIAGNOSIS — T8249XA Other complication of vascular dialysis catheter, initial encounter: Secondary | ICD-10-CM | POA: Diagnosis not present

## 2021-05-22 DIAGNOSIS — L299 Pruritus, unspecified: Secondary | ICD-10-CM | POA: Diagnosis not present

## 2021-05-22 DIAGNOSIS — D689 Coagulation defect, unspecified: Secondary | ICD-10-CM | POA: Diagnosis not present

## 2021-05-22 DIAGNOSIS — T8249XS Other complication of vascular dialysis catheter, sequela: Secondary | ICD-10-CM | POA: Diagnosis not present

## 2021-05-25 DIAGNOSIS — D509 Iron deficiency anemia, unspecified: Secondary | ICD-10-CM | POA: Diagnosis not present

## 2021-05-25 DIAGNOSIS — Z992 Dependence on renal dialysis: Secondary | ICD-10-CM | POA: Diagnosis not present

## 2021-05-25 DIAGNOSIS — D689 Coagulation defect, unspecified: Secondary | ICD-10-CM | POA: Diagnosis not present

## 2021-05-25 DIAGNOSIS — N186 End stage renal disease: Secondary | ICD-10-CM | POA: Diagnosis not present

## 2021-05-25 DIAGNOSIS — L299 Pruritus, unspecified: Secondary | ICD-10-CM | POA: Diagnosis not present

## 2021-05-25 DIAGNOSIS — T8249XA Other complication of vascular dialysis catheter, initial encounter: Secondary | ICD-10-CM | POA: Diagnosis not present

## 2021-05-25 DIAGNOSIS — N2581 Secondary hyperparathyroidism of renal origin: Secondary | ICD-10-CM | POA: Diagnosis not present

## 2021-05-25 DIAGNOSIS — T8249XS Other complication of vascular dialysis catheter, sequela: Secondary | ICD-10-CM | POA: Diagnosis not present

## 2021-05-29 DIAGNOSIS — N186 End stage renal disease: Secondary | ICD-10-CM | POA: Diagnosis not present

## 2021-05-29 DIAGNOSIS — L299 Pruritus, unspecified: Secondary | ICD-10-CM | POA: Diagnosis not present

## 2021-05-29 DIAGNOSIS — D689 Coagulation defect, unspecified: Secondary | ICD-10-CM | POA: Diagnosis not present

## 2021-05-29 DIAGNOSIS — T8249XS Other complication of vascular dialysis catheter, sequela: Secondary | ICD-10-CM | POA: Diagnosis not present

## 2021-05-29 DIAGNOSIS — Z992 Dependence on renal dialysis: Secondary | ICD-10-CM | POA: Diagnosis not present

## 2021-05-29 DIAGNOSIS — N2581 Secondary hyperparathyroidism of renal origin: Secondary | ICD-10-CM | POA: Diagnosis not present

## 2021-05-29 DIAGNOSIS — D509 Iron deficiency anemia, unspecified: Secondary | ICD-10-CM | POA: Diagnosis not present

## 2021-05-29 DIAGNOSIS — T8249XA Other complication of vascular dialysis catheter, initial encounter: Secondary | ICD-10-CM | POA: Diagnosis not present

## 2021-06-01 DIAGNOSIS — T8249XS Other complication of vascular dialysis catheter, sequela: Secondary | ICD-10-CM | POA: Diagnosis not present

## 2021-06-01 DIAGNOSIS — L299 Pruritus, unspecified: Secondary | ICD-10-CM | POA: Diagnosis not present

## 2021-06-01 DIAGNOSIS — D509 Iron deficiency anemia, unspecified: Secondary | ICD-10-CM | POA: Diagnosis not present

## 2021-06-01 DIAGNOSIS — N2581 Secondary hyperparathyroidism of renal origin: Secondary | ICD-10-CM | POA: Diagnosis not present

## 2021-06-01 DIAGNOSIS — D689 Coagulation defect, unspecified: Secondary | ICD-10-CM | POA: Diagnosis not present

## 2021-06-01 DIAGNOSIS — T8249XA Other complication of vascular dialysis catheter, initial encounter: Secondary | ICD-10-CM | POA: Diagnosis not present

## 2021-06-01 DIAGNOSIS — N186 End stage renal disease: Secondary | ICD-10-CM | POA: Diagnosis not present

## 2021-06-01 DIAGNOSIS — Z992 Dependence on renal dialysis: Secondary | ICD-10-CM | POA: Diagnosis not present

## 2021-06-03 DIAGNOSIS — N041 Nephrotic syndrome with focal and segmental glomerular lesions: Secondary | ICD-10-CM | POA: Diagnosis not present

## 2021-06-03 DIAGNOSIS — Z992 Dependence on renal dialysis: Secondary | ICD-10-CM | POA: Diagnosis not present

## 2021-06-03 DIAGNOSIS — N186 End stage renal disease: Secondary | ICD-10-CM | POA: Diagnosis not present

## 2021-06-05 DIAGNOSIS — N2581 Secondary hyperparathyroidism of renal origin: Secondary | ICD-10-CM | POA: Diagnosis not present

## 2021-06-05 DIAGNOSIS — D631 Anemia in chronic kidney disease: Secondary | ICD-10-CM | POA: Diagnosis not present

## 2021-06-05 DIAGNOSIS — N186 End stage renal disease: Secondary | ICD-10-CM | POA: Diagnosis not present

## 2021-06-05 DIAGNOSIS — L299 Pruritus, unspecified: Secondary | ICD-10-CM | POA: Diagnosis not present

## 2021-06-05 DIAGNOSIS — T8249XA Other complication of vascular dialysis catheter, initial encounter: Secondary | ICD-10-CM | POA: Diagnosis not present

## 2021-06-05 DIAGNOSIS — Z992 Dependence on renal dialysis: Secondary | ICD-10-CM | POA: Diagnosis not present

## 2021-06-05 DIAGNOSIS — D509 Iron deficiency anemia, unspecified: Secondary | ICD-10-CM | POA: Diagnosis not present

## 2021-06-05 DIAGNOSIS — T8249XS Other complication of vascular dialysis catheter, sequela: Secondary | ICD-10-CM | POA: Diagnosis not present

## 2021-06-05 DIAGNOSIS — D689 Coagulation defect, unspecified: Secondary | ICD-10-CM | POA: Diagnosis not present

## 2021-06-12 DIAGNOSIS — L299 Pruritus, unspecified: Secondary | ICD-10-CM | POA: Diagnosis not present

## 2021-06-12 DIAGNOSIS — D509 Iron deficiency anemia, unspecified: Secondary | ICD-10-CM | POA: Diagnosis not present

## 2021-06-12 DIAGNOSIS — N2581 Secondary hyperparathyroidism of renal origin: Secondary | ICD-10-CM | POA: Diagnosis not present

## 2021-06-12 DIAGNOSIS — Z992 Dependence on renal dialysis: Secondary | ICD-10-CM | POA: Diagnosis not present

## 2021-06-12 DIAGNOSIS — T8249XA Other complication of vascular dialysis catheter, initial encounter: Secondary | ICD-10-CM | POA: Diagnosis not present

## 2021-06-12 DIAGNOSIS — T8249XS Other complication of vascular dialysis catheter, sequela: Secondary | ICD-10-CM | POA: Diagnosis not present

## 2021-06-12 DIAGNOSIS — D631 Anemia in chronic kidney disease: Secondary | ICD-10-CM | POA: Diagnosis not present

## 2021-06-12 DIAGNOSIS — N186 End stage renal disease: Secondary | ICD-10-CM | POA: Diagnosis not present

## 2021-06-12 DIAGNOSIS — D689 Coagulation defect, unspecified: Secondary | ICD-10-CM | POA: Diagnosis not present

## 2021-06-15 DIAGNOSIS — D631 Anemia in chronic kidney disease: Secondary | ICD-10-CM | POA: Diagnosis not present

## 2021-06-15 DIAGNOSIS — D509 Iron deficiency anemia, unspecified: Secondary | ICD-10-CM | POA: Diagnosis not present

## 2021-06-15 DIAGNOSIS — T8249XS Other complication of vascular dialysis catheter, sequela: Secondary | ICD-10-CM | POA: Diagnosis not present

## 2021-06-15 DIAGNOSIS — Z992 Dependence on renal dialysis: Secondary | ICD-10-CM | POA: Diagnosis not present

## 2021-06-15 DIAGNOSIS — N186 End stage renal disease: Secondary | ICD-10-CM | POA: Diagnosis not present

## 2021-06-15 DIAGNOSIS — D689 Coagulation defect, unspecified: Secondary | ICD-10-CM | POA: Diagnosis not present

## 2021-06-15 DIAGNOSIS — N2581 Secondary hyperparathyroidism of renal origin: Secondary | ICD-10-CM | POA: Diagnosis not present

## 2021-06-15 DIAGNOSIS — L299 Pruritus, unspecified: Secondary | ICD-10-CM | POA: Diagnosis not present

## 2021-06-15 DIAGNOSIS — T8249XA Other complication of vascular dialysis catheter, initial encounter: Secondary | ICD-10-CM | POA: Diagnosis not present

## 2021-06-17 DIAGNOSIS — D509 Iron deficiency anemia, unspecified: Secondary | ICD-10-CM | POA: Diagnosis not present

## 2021-06-17 DIAGNOSIS — Z992 Dependence on renal dialysis: Secondary | ICD-10-CM | POA: Diagnosis not present

## 2021-06-17 DIAGNOSIS — D631 Anemia in chronic kidney disease: Secondary | ICD-10-CM | POA: Diagnosis not present

## 2021-06-17 DIAGNOSIS — N186 End stage renal disease: Secondary | ICD-10-CM | POA: Diagnosis not present

## 2021-06-17 DIAGNOSIS — T8249XA Other complication of vascular dialysis catheter, initial encounter: Secondary | ICD-10-CM | POA: Diagnosis not present

## 2021-06-17 DIAGNOSIS — T8249XS Other complication of vascular dialysis catheter, sequela: Secondary | ICD-10-CM | POA: Diagnosis not present

## 2021-06-17 DIAGNOSIS — D689 Coagulation defect, unspecified: Secondary | ICD-10-CM | POA: Diagnosis not present

## 2021-06-17 DIAGNOSIS — L299 Pruritus, unspecified: Secondary | ICD-10-CM | POA: Diagnosis not present

## 2021-06-17 DIAGNOSIS — N2581 Secondary hyperparathyroidism of renal origin: Secondary | ICD-10-CM | POA: Diagnosis not present

## 2021-06-24 DIAGNOSIS — T8249XS Other complication of vascular dialysis catheter, sequela: Secondary | ICD-10-CM | POA: Diagnosis not present

## 2021-06-24 DIAGNOSIS — N2581 Secondary hyperparathyroidism of renal origin: Secondary | ICD-10-CM | POA: Diagnosis not present

## 2021-06-24 DIAGNOSIS — D689 Coagulation defect, unspecified: Secondary | ICD-10-CM | POA: Diagnosis not present

## 2021-06-24 DIAGNOSIS — T8249XA Other complication of vascular dialysis catheter, initial encounter: Secondary | ICD-10-CM | POA: Diagnosis not present

## 2021-06-24 DIAGNOSIS — D509 Iron deficiency anemia, unspecified: Secondary | ICD-10-CM | POA: Diagnosis not present

## 2021-06-24 DIAGNOSIS — L299 Pruritus, unspecified: Secondary | ICD-10-CM | POA: Diagnosis not present

## 2021-06-24 DIAGNOSIS — N186 End stage renal disease: Secondary | ICD-10-CM | POA: Diagnosis not present

## 2021-06-24 DIAGNOSIS — D631 Anemia in chronic kidney disease: Secondary | ICD-10-CM | POA: Diagnosis not present

## 2021-06-24 DIAGNOSIS — Z992 Dependence on renal dialysis: Secondary | ICD-10-CM | POA: Diagnosis not present

## 2021-06-29 DIAGNOSIS — T8249XS Other complication of vascular dialysis catheter, sequela: Secondary | ICD-10-CM | POA: Diagnosis not present

## 2021-06-29 DIAGNOSIS — N2581 Secondary hyperparathyroidism of renal origin: Secondary | ICD-10-CM | POA: Diagnosis not present

## 2021-06-29 DIAGNOSIS — D509 Iron deficiency anemia, unspecified: Secondary | ICD-10-CM | POA: Diagnosis not present

## 2021-06-29 DIAGNOSIS — D689 Coagulation defect, unspecified: Secondary | ICD-10-CM | POA: Diagnosis not present

## 2021-06-29 DIAGNOSIS — Z992 Dependence on renal dialysis: Secondary | ICD-10-CM | POA: Diagnosis not present

## 2021-06-29 DIAGNOSIS — T8249XA Other complication of vascular dialysis catheter, initial encounter: Secondary | ICD-10-CM | POA: Diagnosis not present

## 2021-06-29 DIAGNOSIS — N186 End stage renal disease: Secondary | ICD-10-CM | POA: Diagnosis not present

## 2021-06-29 DIAGNOSIS — L299 Pruritus, unspecified: Secondary | ICD-10-CM | POA: Diagnosis not present

## 2021-06-29 DIAGNOSIS — D631 Anemia in chronic kidney disease: Secondary | ICD-10-CM | POA: Diagnosis not present

## 2021-07-01 DIAGNOSIS — T8249XS Other complication of vascular dialysis catheter, sequela: Secondary | ICD-10-CM | POA: Diagnosis not present

## 2021-07-01 DIAGNOSIS — D631 Anemia in chronic kidney disease: Secondary | ICD-10-CM | POA: Diagnosis not present

## 2021-07-01 DIAGNOSIS — T8249XA Other complication of vascular dialysis catheter, initial encounter: Secondary | ICD-10-CM | POA: Diagnosis not present

## 2021-07-01 DIAGNOSIS — D509 Iron deficiency anemia, unspecified: Secondary | ICD-10-CM | POA: Diagnosis not present

## 2021-07-01 DIAGNOSIS — D689 Coagulation defect, unspecified: Secondary | ICD-10-CM | POA: Diagnosis not present

## 2021-07-01 DIAGNOSIS — L299 Pruritus, unspecified: Secondary | ICD-10-CM | POA: Diagnosis not present

## 2021-07-01 DIAGNOSIS — Z992 Dependence on renal dialysis: Secondary | ICD-10-CM | POA: Diagnosis not present

## 2021-07-01 DIAGNOSIS — N2581 Secondary hyperparathyroidism of renal origin: Secondary | ICD-10-CM | POA: Diagnosis not present

## 2021-07-01 DIAGNOSIS — N186 End stage renal disease: Secondary | ICD-10-CM | POA: Diagnosis not present

## 2021-07-03 DIAGNOSIS — N041 Nephrotic syndrome with focal and segmental glomerular lesions: Secondary | ICD-10-CM | POA: Diagnosis not present

## 2021-07-03 DIAGNOSIS — Z992 Dependence on renal dialysis: Secondary | ICD-10-CM | POA: Diagnosis not present

## 2021-07-03 DIAGNOSIS — N186 End stage renal disease: Secondary | ICD-10-CM | POA: Diagnosis not present

## 2021-07-06 ENCOUNTER — Encounter (HOSPITAL_COMMUNITY): Payer: Self-pay | Admitting: *Deleted

## 2021-07-06 ENCOUNTER — Ambulatory Visit (HOSPITAL_COMMUNITY)
Admission: EM | Admit: 2021-07-06 | Discharge: 2021-07-06 | Disposition: A | Discharge status: Home / Self Care | Payer: Medicare Other | Attending: Emergency Medicine | Admitting: Emergency Medicine

## 2021-07-06 DIAGNOSIS — M25561 Pain in right knee: Secondary | ICD-10-CM | POA: Diagnosis not present

## 2021-07-06 HISTORY — DX: Dependence on renal dialysis: Z99.2

## 2021-07-06 MED ORDER — PREDNISONE 20 MG PO TABS: 40.0000 mg | ORAL_TABLET | Freq: Every day | ORAL | 0 refills | Status: DC

## 2021-07-06 MED ORDER — METHYLPREDNISOLONE SODIUM SUCC 125 MG IJ SOLR
INTRAMUSCULAR | Status: AC
  Filled 2021-07-06: qty 2

## 2021-07-06 MED ORDER — CYCLOBENZAPRINE HCL 5 MG PO TABS: 5.0000 mg | ORAL_TABLET | Freq: Every day | ORAL | 0 refills | Status: DC

## 2021-07-06 MED ORDER — METHYLPREDNISOLONE SODIUM SUCC 125 MG IJ SOLR
60.0000 mg | Freq: Once | INTRAMUSCULAR | Status: AC
  Administered 2021-07-06: 60 mg via INTRAMUSCULAR

## 2021-07-06 NOTE — ED Provider Notes (Signed)
[place]    CSN: [id number] Arrival date & time: 07/06/21  0806      History   Chief Complaint Chief Complaint  Patient presents with   Knee Pain    HPI [name] is a 46 y.o. female.   Patient presents with pain and a sensation of a knot to the outer knee beginning 1 day ago.  Endorses that she was standing up at work when symptoms abruptly began, denies injury, precipitating event or trauma.  Has been painful to bear weight causing her to limp.  Range of motion is intact but pain is elicited with all movements.  Has attempted use of behaviors which has been ineffective.  Denies numbness, tingling.   Past Medical History:  Diagnosis Date   Anemia    Anxiety    pt not aware of this   Arthritis    Breast mass 09/01/2016   Targeted ultrasound is performed, showing a mass in the left breast at 11 o'clock, 1 cm from the nipple measuring 6.4 by 5.0 cm. The mass is heterogeneous and may contain macroscopic fat.   IMPRESSION: The mass in the left breast is probably benign, possibly a Hamartoma.  RECOMMENDATION: Recommend six-month follow-up mammogram of the probably benign left breast mass. If necessary, an ultrasound co   CHF (congestive heart failure) ([place])    hosp. 2011   Chronic kidney disease    Mon Wed Friday   Dilated cardiomyopathy ([place])    Encounter for blood transfusion    ESRD (end stage renal disease) ([place])    MWF - east    Family history of adverse reaction to anesthesia     " Aunt has a tracheostomy and kept coughing after colonoscopy "   Foot fracture    Gout    no flare up for several yrs   Headache    Heart murmur    as a child, no problems as an adult   Hemodialysis patient ([place])    History of blood transfusion    x 2   History of pneumonia    Hyperlipidemia    Hypertension    Menorrhagia    Morbid obesity ([place])    Pneumonia    hosp. 2010   Shortness of breath    HX CHL - occasional. 04/09/21 patient sits down and  shortness of breath susides.   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   Thyroid disease     Patient Active Problem List   Diagnosis Date Noted   ESRD (end stage renal disease) on dialysis ([place]) 10/21/2020   Anemia in chronic kidney disease (CKD) 07/31/2015   Menorrhagia with irregular cycle 02/27/2014   Essential hypertension 02/27/2014   Menorrhagia 01/27/2013   HYPERLIPIDEMIA-MIXED 04/20/2008   OBESITY-MORBID (>100') 04/20/2008   MITRAL REGURGITATION 04/20/2008   CARDIOMYOPATHY, DILATED 04/20/2008    Past Surgical History:  Procedure Laterality Date   ANGIOPLASTY  07/28/2015   Procedure: ANGIOPLASTY OF RIGHT BRACHIAL VEIN;  Surgeon: Conrad Horse Pasture, MD;  Location: [place];  Service: Vascular;;   AV FISTULA PLACEMENT  08/04/2011   Procedure: ARTERIOVENOUS (AV) FISTULA CREATION;  Surgeon: Elam Dutch, MD;  Location: [facility] OR;  Service: Vascular;  Laterality: Right;  Creation right brachiocephalic arteriovenous fistula   AV FISTULA PLACEMENT Left 02/05/2013   Procedure: ARTERIOVENOUS FISTULA CREATION LEFT ARM;  Surgeon: Aaron Edelman  Starlyn Skeans, MD;  Location: [place];  Service: Vascular;  Laterality: Left;   AV FISTULA PLACEMENT Right 06/04/2013   Procedure: ARTERIOVENOUS (AV) FISTULA CREATION;  Surgeon: Conrad Iron, MD;  Location: [place];  Service: Vascular;  Laterality: Right;   AV FISTULA PLACEMENT Right 04/15/2014   Procedure: INSERTION OF ARTERIOVENOUS (AV) GORE-TEX GRAFT ARM;  Surgeon: Conrad Kenton, MD;  Location: [place];  Service: Vascular;  Laterality: Right;   AV FISTULA PLACEMENT Left 02/25/2021   Procedure: LEFT ARM ARTERIOVENOUS (AV) FISTULA CREATION;  Surgeon: Marty Heck, MD;  Location: [place];  Service: Vascular;  Laterality: Left;   [place] Right 08/13/2013   Procedure: Second Stage Brachial Vein Transposition;  Surgeon: Conrad Lincoln, MD;  Location: [place];  Service: Vascular;  Laterality: Right;   [place] Left 04/13/2021   Procedure: LEFT ARM SECOND STAGE BASILIC VEIN TRANSPOSITION;  Surgeon: Marty Heck, MD;  Location: [place];  Service: Vascular;  Laterality: Left;   CARDIAC SURGERY  1979   repair of hole in heart   INCISION AND DRAINAGE OF WOUND Right 07/28/2015   Procedure: IRRIGATION AND DEBRIDEMENT RIGHT HAND  WOUND;  Surgeon: Dayna Barker, MD;  Location: [place];  Service: Plastics;  Laterality: Right;   LIGATION OF ARTERIOVENOUS  FISTULA Left 03/05/2013   Procedure: LIGATION OF ARTERIOVENOUS  FISTULA;  Surgeon: Conrad Waikele, MD;  Location: [place];  Service: Vascular;  Laterality: Left;   PATCH ANGIOPLASTY Left 03/05/2013   Procedure: PATCH ANGIOPLASTY;  Surgeon: Conrad Perrin, MD;  Location: [place];  Service: Vascular;  Laterality: Left;   PERIPHERAL VASCULAR CATHETERIZATION Right 10/31/2014   Procedure: Fistulagram;  Surgeon: Conrad Milford, MD;  Location: [place] [facility];  Service: Cardiovascular;  Laterality: Right;   PERIPHERAL VASCULAR CATHETERIZATION Right 10/31/2014   Procedure: Peripheral Vascular Balloon Angioplasty;  Surgeon: Conrad Lolita, MD;  Location: [place] [facility];  Service: Cardiovascular;  Laterality: Right;  pta venous rt arm   PERIPHERAL VASCULAR CATHETERIZATION N/A 03/13/2015   Procedure: A/V Shuntogram;  Surgeon: Conrad [place], MD;  Location: [place] [facility];  Service: Cardiovascular;  Laterality: N/A;   PERIPHERAL VASCULAR CATHETERIZATION Right 03/13/2015   Procedure: Peripheral Vascular Balloon Angioplasty;  Surgeon: Conrad [place], MD;  Location: [place] [facility];  Service: Cardiovascular;  Laterality: Right;  Shunt   PERIPHERAL VASCULAR CATHETERIZATION Right 01/19/2016   Procedure: A/V Shuntogram;  Surgeon: Angelia Mould, MD;  Location: [place] [facility];  Service: Cardiovascular;  Laterality: Right;   REVISION OF ARTERIOVENOUS GORETEX GRAFT Right 10/27/2015    Procedure: THROMBECTOMY AND REVISION OF RIGHT UPPER ARM  ARTERIOVENOUS GORETEX GRAFT USING 6MM X 10CM GORE-TEX GRAFT;  Surgeon: Elam Dutch, MD;  Location: [place];  Service: Vascular;  Laterality: Right;   REVISION OF ARTERIOVENOUS GORETEX GRAFT Right 01/30/2016   Procedure: REVISION OF RIGHT UPPER ARM ARTERIOVENOUS USING GORETEX GRAFT;  Surgeon: Angelia Mould, MD;  Location: [place];  Service: Vascular;  Laterality: Right;   THROMBECTOMY W/ EMBOLECTOMY Left 03/05/2013   Procedure: THROMBECTOMY OF LEFT BRACHIAL ARTERY;  Surgeon: Conrad [place], MD;  Location: [place];  Service: Vascular;  Laterality: Left;   VENOGRAM Bilateral 03/25/2014   Procedure: VENOGRAM bilateral;  Surgeon: Conrad , MD;  Location: [facility] CATH LAB;  Service: Cardiovascular;  Laterality: Bilateral;    OB History   No obstetric history on file.      Home Medications  Prior to Admission medications   Medication Sig Start Date End Date Taking? Authorizing Provider  Aspirin-Caffeine (BAYER BACK & BODY) 500-32.5 MG TABS Take 2 tablets by mouth 2 (two) times daily as needed (pain.).   Yes [provider]  AURYXIA 1 GM 210 MG(Fe) tablet Take 630 mg by mouth See admin instructions. Take 3 tablets (630 mg) by mouth with meals and snacks 01/19/21  Yes [provider]  cinacalcet (SENSIPAR) 60 MG tablet Take 60 mg by mouth in the morning.   Yes [provider]  megestrol (MEGACE) 20 MG tablet Take 2 tablets (40 mg total) by mouth daily. 10/21/20 10/16/21 Yes Beard, Ralph Leyden, DO  HYDROcodone-acetaminophen (NORCO/VICODIN) 5-325 MG tablet Take 1 tablet by mouth every 6 (six) hours as needed for moderate pain. 04/13/21 04/13/22  Rhyne, Hulen Shouts, PA-C  PROAIR HFA 108 (90 Base) MCG/ACT inhaler Inhale 1-2 puffs into the lungs 3 (three) times daily as needed for wheezing or shortness of breath. For wheezing. 09/04/15   [provider]    Family History Family History  Problem Relation Age of  Onset   Diabetes Mother    Hyperlipidemia Mother    Hypertension Mother    Kidney disease Mother    Anesthesia problems Neg Hx    Hypotension Neg Hx    Malignant hyperthermia Neg Hx    Pseudochol deficiency Neg Hx     Social History Social History   Tobacco Use   Smoking status: Never   Smokeless tobacco: Never  Vaping Use   Vaping Use: Never used  Substance Use Topics   Alcohol use: No    Alcohol/week: 0.0 standard drinks of alcohol   Drug use: No     Allergies   Pollen extract and Oxycodone-acetaminophen   Review of Systems Review of Systems  Constitutional: Negative.   Respiratory: Negative.    Cardiovascular: Negative.   Musculoskeletal:  Positive for joint swelling and myalgias. Negative for arthralgias, back pain, gait problem, neck pain and neck stiffness.  Skin: Negative.   Neurological: Negative.      Physical Exam Triage Vital Signs ED Triage Vitals  Enc Vitals Group     BP 07/06/21 0833 (!) 196/97     Pulse Rate 07/06/21 0833 79     Resp 07/06/21 0833 (!) 22     Temp 07/06/21 0833 98.3 F (36.8 C)     Temp Source 07/06/21 0833 Oral     SpO2 07/06/21 0833 97 %     Weight --      Height --      Head Circumference --      Peak Flow --      Pain Score 07/06/21 0836 8     Pain Loc --      Pain Edu? --      Excl. in [place]? --    No data found.  Updated Vital Signs BP (!) 196/97 Comment: missed hemodialysis session yesterday  Pulse 79   Temp 98.3 F (36.8 C) (Oral)   Resp (!) 22   SpO2 97%   Visual Acuity Right Eye Distance:   Left Eye Distance:   Bilateral Distance:    Right Eye Near:   Left Eye Near:    Bilateral Near:     Physical Exam Constitutional:      Appearance: Normal appearance.  HENT:     Head: Normocephalic.  Eyes:     Extraocular Movements: Extraocular movements intact.  Pulmonary:     Effort: Pulmonary  effort is normal.  Musculoskeletal:     Comments: Mild swelling and tenderness along the anterior of the right  knee on the lateral aspect, no effusion noted, able to bear weight, range of motion intact, 2+ popliteal pulse  Skin:    General: Skin is warm and dry.  Neurological:     Mental Status: She is alert and oriented to person, place, and time. Mental status is at baseline.  Psychiatric:        Mood and Affect: Mood normal.        Behavior: Behavior normal.      UC Treatments / Results  Labs (all labs ordered are listed, but only abnormal results are displayed) Labs Reviewed - No data to display  EKG   Radiology No results found.  Procedures Procedures (including critical care time)  Medications Ordered in UC Medications - No data to display  Initial Impression / Assessment and Plan / UC Course  I have reviewed the triage vital signs and the nursing notes.  Pertinent labs & imaging results that were available during my care of the patient were reviewed by me and considered in my medical decision making (see chart for details).  Acute right knee pain  Etiology is most likely irritation to the meniscus or the ligament, discussed with patient, low suspicion for bone involvement due to lack of injury, therefore will defer imaging, methylprednisolone injection given in office and prednisone burst and muscle relaxer prescribed for outpatient use, as patient has missed dialysis appointment due to pain today recommended that she go tomorrow which her site has already approved her to do so, may use topical medications as well as RICE in addition for support, given walker referral to orthopedics if symptoms persist or worsen Final Clinical Impressions(s) / UC Diagnoses   Final diagnoses:  None   Discharge Instructions   None    ED Prescriptions   None    PDMP not reviewed this encounter.   Hans Eden, NP 07/06/21 0900

## 2021-07-06 NOTE — ED Triage Notes (Signed)
Hemodialysis pt -- states missed session yesterday due to knee pain.  Pt states right knee "felt like it was going to go out" along with pain while she was at work yesterday afternoon. No known injury. Pt ambulates with marked limp. RLE CMS intact.  Has been taking Bayer without relief.

## 2021-07-06 NOTE — Discharge Instructions (Signed)
Your pain is most likely caused by irritation to the tendon or ligament.  You have been given an injection of steroids here in the office to help reduce swelling and pain  Starting tomorrow take prednisone every morning with food for the next 5 days, after completing dialysis, take after  You may use muscle relaxer at bedtime for additional comfort as needed, be mindful this medication may make you drowsy  You may attempt use of over-the-counter Voltaren gel or lidocaine cream directly over the knee for additional comfort  You may use heating pad in 15 minute intervals as needed for additional comfort, within the first 2-3 days you may find comfort in using ice in 10-15 minutes over affected area  Begin stretching affected area daily for 10 minutes as tolerated to further loosen muscles   When lying down place pillow underneath and between knees for support   If pain persist after recommended treatment or reoccurs if may be beneficial to follow up with orthopedic specialist for evaluation, this doctor specializes in the bones and can manage your symptoms long-term with options such as but not limited to imaging, medications or physical therapy

## 2021-07-08 DIAGNOSIS — N2581 Secondary hyperparathyroidism of renal origin: Secondary | ICD-10-CM | POA: Diagnosis not present

## 2021-07-08 DIAGNOSIS — N186 End stage renal disease: Secondary | ICD-10-CM | POA: Diagnosis not present

## 2021-07-08 DIAGNOSIS — L299 Pruritus, unspecified: Secondary | ICD-10-CM | POA: Diagnosis not present

## 2021-07-08 DIAGNOSIS — T8249XS Other complication of vascular dialysis catheter, sequela: Secondary | ICD-10-CM | POA: Diagnosis not present

## 2021-07-08 DIAGNOSIS — D689 Coagulation defect, unspecified: Secondary | ICD-10-CM | POA: Diagnosis not present

## 2021-07-08 DIAGNOSIS — Z992 Dependence on renal dialysis: Secondary | ICD-10-CM | POA: Diagnosis not present

## 2021-07-08 DIAGNOSIS — D509 Iron deficiency anemia, unspecified: Secondary | ICD-10-CM | POA: Diagnosis not present

## 2021-07-08 DIAGNOSIS — T8249XA Other complication of vascular dialysis catheter, initial encounter: Secondary | ICD-10-CM | POA: Diagnosis not present

## 2021-07-13 DIAGNOSIS — N186 End stage renal disease: Secondary | ICD-10-CM | POA: Diagnosis not present

## 2021-07-13 DIAGNOSIS — D689 Coagulation defect, unspecified: Secondary | ICD-10-CM | POA: Diagnosis not present

## 2021-07-13 DIAGNOSIS — Z992 Dependence on renal dialysis: Secondary | ICD-10-CM | POA: Diagnosis not present

## 2021-07-13 DIAGNOSIS — T8249XS Other complication of vascular dialysis catheter, sequela: Secondary | ICD-10-CM | POA: Diagnosis not present

## 2021-07-13 DIAGNOSIS — D509 Iron deficiency anemia, unspecified: Secondary | ICD-10-CM | POA: Diagnosis not present

## 2021-07-13 DIAGNOSIS — T8249XA Other complication of vascular dialysis catheter, initial encounter: Secondary | ICD-10-CM | POA: Diagnosis not present

## 2021-07-13 DIAGNOSIS — N2581 Secondary hyperparathyroidism of renal origin: Secondary | ICD-10-CM | POA: Diagnosis not present

## 2021-07-13 DIAGNOSIS — L299 Pruritus, unspecified: Secondary | ICD-10-CM | POA: Diagnosis not present

## 2021-07-15 DIAGNOSIS — N2581 Secondary hyperparathyroidism of renal origin: Secondary | ICD-10-CM | POA: Diagnosis not present

## 2021-07-15 DIAGNOSIS — Z992 Dependence on renal dialysis: Secondary | ICD-10-CM | POA: Diagnosis not present

## 2021-07-15 DIAGNOSIS — D509 Iron deficiency anemia, unspecified: Secondary | ICD-10-CM | POA: Diagnosis not present

## 2021-07-15 DIAGNOSIS — N186 End stage renal disease: Secondary | ICD-10-CM | POA: Diagnosis not present

## 2021-07-15 DIAGNOSIS — T8249XS Other complication of vascular dialysis catheter, sequela: Secondary | ICD-10-CM | POA: Diagnosis not present

## 2021-07-15 DIAGNOSIS — D689 Coagulation defect, unspecified: Secondary | ICD-10-CM | POA: Diagnosis not present

## 2021-07-15 DIAGNOSIS — T8249XA Other complication of vascular dialysis catheter, initial encounter: Secondary | ICD-10-CM | POA: Diagnosis not present

## 2021-07-15 DIAGNOSIS — L299 Pruritus, unspecified: Secondary | ICD-10-CM | POA: Diagnosis not present

## 2021-07-17 DIAGNOSIS — N2581 Secondary hyperparathyroidism of renal origin: Secondary | ICD-10-CM | POA: Diagnosis not present

## 2021-07-17 DIAGNOSIS — D689 Coagulation defect, unspecified: Secondary | ICD-10-CM | POA: Diagnosis not present

## 2021-07-17 DIAGNOSIS — T8249XS Other complication of vascular dialysis catheter, sequela: Secondary | ICD-10-CM | POA: Diagnosis not present

## 2021-07-17 DIAGNOSIS — D509 Iron deficiency anemia, unspecified: Secondary | ICD-10-CM | POA: Diagnosis not present

## 2021-07-17 DIAGNOSIS — Z992 Dependence on renal dialysis: Secondary | ICD-10-CM | POA: Diagnosis not present

## 2021-07-17 DIAGNOSIS — T8249XA Other complication of vascular dialysis catheter, initial encounter: Secondary | ICD-10-CM | POA: Diagnosis not present

## 2021-07-17 DIAGNOSIS — L299 Pruritus, unspecified: Secondary | ICD-10-CM | POA: Diagnosis not present

## 2021-07-17 DIAGNOSIS — N186 End stage renal disease: Secondary | ICD-10-CM | POA: Diagnosis not present

## 2021-07-22 DIAGNOSIS — N2581 Secondary hyperparathyroidism of renal origin: Secondary | ICD-10-CM | POA: Diagnosis not present

## 2021-07-22 DIAGNOSIS — L299 Pruritus, unspecified: Secondary | ICD-10-CM | POA: Diagnosis not present

## 2021-07-22 DIAGNOSIS — T8249XA Other complication of vascular dialysis catheter, initial encounter: Secondary | ICD-10-CM | POA: Diagnosis not present

## 2021-07-22 DIAGNOSIS — Z992 Dependence on renal dialysis: Secondary | ICD-10-CM | POA: Diagnosis not present

## 2021-07-22 DIAGNOSIS — D509 Iron deficiency anemia, unspecified: Secondary | ICD-10-CM | POA: Diagnosis not present

## 2021-07-22 DIAGNOSIS — T8249XS Other complication of vascular dialysis catheter, sequela: Secondary | ICD-10-CM | POA: Diagnosis not present

## 2021-07-22 DIAGNOSIS — D689 Coagulation defect, unspecified: Secondary | ICD-10-CM | POA: Diagnosis not present

## 2021-07-22 DIAGNOSIS — N186 End stage renal disease: Secondary | ICD-10-CM | POA: Diagnosis not present

## 2021-07-24 DIAGNOSIS — T8249XS Other complication of vascular dialysis catheter, sequela: Secondary | ICD-10-CM | POA: Diagnosis not present

## 2021-07-24 DIAGNOSIS — N2581 Secondary hyperparathyroidism of renal origin: Secondary | ICD-10-CM | POA: Diagnosis not present

## 2021-07-24 DIAGNOSIS — D509 Iron deficiency anemia, unspecified: Secondary | ICD-10-CM | POA: Diagnosis not present

## 2021-07-24 DIAGNOSIS — Z992 Dependence on renal dialysis: Secondary | ICD-10-CM | POA: Diagnosis not present

## 2021-07-24 DIAGNOSIS — T8249XA Other complication of vascular dialysis catheter, initial encounter: Secondary | ICD-10-CM | POA: Diagnosis not present

## 2021-07-24 DIAGNOSIS — N186 End stage renal disease: Secondary | ICD-10-CM | POA: Diagnosis not present

## 2021-07-24 DIAGNOSIS — L299 Pruritus, unspecified: Secondary | ICD-10-CM | POA: Diagnosis not present

## 2021-07-24 DIAGNOSIS — D689 Coagulation defect, unspecified: Secondary | ICD-10-CM | POA: Diagnosis not present

## 2021-07-28 ENCOUNTER — Telehealth: Payer: Self-pay

## 2021-07-28 NOTE — Patient Outreach (Signed)
  Care Management   Outreach Note  07/28/2021 Name: LINNELL SWORDS MRN: [id number] DOB: February 28, 1975  An unsuccessful telephone outreach was attempted today. The patient was referred to the case management team for assistance with care management and care coordination.    Follow Up Plan:  A HIPAA compliant voice message was left today requesting a return call.   [place] [facility] [phone]

## 2021-07-29 DIAGNOSIS — D509 Iron deficiency anemia, unspecified: Secondary | ICD-10-CM | POA: Diagnosis not present

## 2021-07-29 DIAGNOSIS — D689 Coagulation defect, unspecified: Secondary | ICD-10-CM | POA: Diagnosis not present

## 2021-07-29 DIAGNOSIS — T8249XA Other complication of vascular dialysis catheter, initial encounter: Secondary | ICD-10-CM | POA: Diagnosis not present

## 2021-07-29 DIAGNOSIS — T8249XS Other complication of vascular dialysis catheter, sequela: Secondary | ICD-10-CM | POA: Diagnosis not present

## 2021-07-29 DIAGNOSIS — N186 End stage renal disease: Secondary | ICD-10-CM | POA: Diagnosis not present

## 2021-07-29 DIAGNOSIS — N2581 Secondary hyperparathyroidism of renal origin: Secondary | ICD-10-CM | POA: Diagnosis not present

## 2021-07-29 DIAGNOSIS — L299 Pruritus, unspecified: Secondary | ICD-10-CM | POA: Diagnosis not present

## 2021-07-29 DIAGNOSIS — Z992 Dependence on renal dialysis: Secondary | ICD-10-CM | POA: Diagnosis not present

## 2021-07-30 ENCOUNTER — Ambulatory Visit (INDEPENDENT_AMBULATORY_CARE_PROVIDER_SITE_OTHER): Payer: Medicare Other | Admitting: Physician Assistant

## 2021-07-30 DIAGNOSIS — N186 End stage renal disease: Secondary | ICD-10-CM

## 2021-07-30 DIAGNOSIS — Z992 Dependence on renal dialysis: Secondary | ICD-10-CM | POA: Diagnosis not present

## 2021-07-31 ENCOUNTER — Encounter: Payer: Self-pay | Admitting: Physician Assistant

## 2021-07-31 NOTE — Progress Notes (Signed)
Office Note     CC:  follow up Requesting Provider:  Glendale Chard, MD  HPI: [name] is a 46 y.o. (14-Jun-1975) female who walked into clinic for urgent evaluation of left arm bruising.  She is status post left second stage basilic vein fistula creation by Dr. Carlis Abbott on 04/13/2021.  She has been dialyzing from this fistula over the past couple weeks.  She woke up this morning with excessive bruising and swelling in the area of cannulation of left arm fistula.  Patient still has a right IJ TDC.   Past Medical History:  Diagnosis Date   Anemia    Anxiety    pt not aware of this   Arthritis    Breast mass 09/01/2016   Targeted ultrasound is performed, showing a mass in the left breast at 11 o'clock, 1 cm from the nipple measuring 6.4 by 5.0 cm. The mass is heterogeneous and may contain macroscopic fat.   IMPRESSION: The mass in the left breast is probably benign, possibly a Hamartoma.  RECOMMENDATION: Recommend six-month follow-up mammogram of the probably benign left breast mass. If necessary, an ultrasound co   CHF (congestive heart failure) ([place])    hosp. 2011   Chronic kidney disease    Mon Wed Friday   Dilated cardiomyopathy ([place])    Encounter for blood transfusion    ESRD (end stage renal disease) ([place])    MWF - [place]   Family history of adverse reaction to anesthesia     " Aunt has a tracheostomy and kept coughing after colonoscopy "   Foot fracture    Gout    no flare up for several yrs   Headache    Heart murmur    as a child, no problems as an adult   Hemodialysis patient ([place])    History of blood transfusion    x 2   History of pneumonia    Hyperlipidemia    Hypertension    Menorrhagia    Morbid obesity ([place])    Pneumonia    hosp. 2010   Shortness of breath    HX CHL - occasional. 04/09/21 patient sits down and shortness of breath susides.   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   Thyroid disease     Past Surgical History:  Procedure Laterality Date   ANGIOPLASTY  07/28/2015   Procedure: ANGIOPLASTY OF RIGHT BRACHIAL VEIN;  Surgeon: Conrad [place], MD;  Location: [place];  Service: Vascular;;   AV FISTULA PLACEMENT  08/04/2011   Procedure: ARTERIOVENOUS (AV) FISTULA CREATION;  Surgeon: Elam Dutch, MD;  Location: [place];  Service: Vascular;  Laterality: Right;  Creation right brachiocephalic arteriovenous fistula   AV FISTULA PLACEMENT Left 02/05/2013   Procedure: ARTERIOVENOUS FISTULA CREATION LEFT ARM;  Surgeon: Conrad [place], MD;  Location: [place];  Service: Vascular;  Laterality: Left;   AV FISTULA PLACEMENT Right 06/04/2013   Procedure: ARTERIOVENOUS (AV) FISTULA CREATION;  Surgeon: Conrad [place], MD;  Location: [place];  Service: Vascular;  Laterality: Right;   AV FISTULA PLACEMENT Right 04/15/2014   Procedure: INSERTION OF ARTERIOVENOUS (AV) GORE-TEX GRAFT ARM;  Surgeon: Conrad [place], MD;  Location: [place];  Service: Vascular;  Laterality: Right;   AV FISTULA PLACEMENT Left 02/25/2021   Procedure: LEFT ARM ARTERIOVENOUS (AV) FISTULA CREATION;  Surgeon: Carlis Abbott,  Gwenyth Allegra, MD;  Location: [place];  Service: Vascular;  Laterality: Left;   [place] Right 08/13/2013   Procedure: Second Stage Brachial Vein Transposition;  Surgeon: Conrad Garfield, MD;  Location: [facility];  Service: Vascular;  Laterality: Right;   [place] Left 04/13/2021   Procedure: LEFT ARM SECOND STAGE BASILIC VEIN TRANSPOSITION;  Surgeon: Marty Heck, MD;  Location: [place];  Service: Vascular;  Laterality: Left;   CARDIAC SURGERY  1979   repair of hole in heart   INCISION AND DRAINAGE OF WOUND Right 07/28/2015   Procedure: IRRIGATION AND DEBRIDEMENT RIGHT HAND  WOUND;  Surgeon: Dayna Barker, MD;  Location: [place];  Service: Plastics;  Laterality: Right;   LIGATION OF ARTERIOVENOUS  FISTULA Left  03/05/2013   Procedure: LIGATION OF ARTERIOVENOUS  FISTULA;  Surgeon: Conrad [place], MD;  Location: [place];  Service: Vascular;  Laterality: Left;   PATCH ANGIOPLASTY Left 03/05/2013   Procedure: PATCH ANGIOPLASTY;  Surgeon: Conrad Dana, MD;  Location: [place];  Service: Vascular;  Laterality: Left;   PERIPHERAL VASCULAR CATHETERIZATION Right 10/31/2014   Procedure: Fistulagram;  Surgeon: Conrad Onaway, MD;  Location: [facility];  Service: Cardiovascular;  Laterality: Right;   PERIPHERAL VASCULAR CATHETERIZATION Right 10/31/2014   Procedure: Peripheral Vascular Balloon Angioplasty;  Surgeon: Conrad [place], MD;  Location: [place] [facility];  Service: Cardiovascular;  Laterality: Right;  pta venous rt arm   PERIPHERAL VASCULAR CATHETERIZATION N/A 03/13/2015   Procedure: A/V Shuntogram;  Surgeon: Conrad [place], MD;  Location: [facility] [facility];  Service: Cardiovascular;  Laterality: N/A;   PERIPHERAL VASCULAR CATHETERIZATION Right 03/13/2015   Procedure: Peripheral Vascular Balloon Angioplasty;  Surgeon: Conrad Belford, MD;  Location: [place] [facility];  Service: Cardiovascular;  Laterality: Right;  Shunt   PERIPHERAL VASCULAR CATHETERIZATION Right 01/19/2016   Procedure: A/V Shuntogram;  Surgeon: Angelia Mould, MD;  Location: [place] [facility];  Service: Cardiovascular;  Laterality: Right;   REVISION OF ARTERIOVENOUS GORETEX GRAFT Right 10/27/2015   Procedure: THROMBECTOMY AND REVISION OF RIGHT UPPER ARM  ARTERIOVENOUS GORETEX GRAFT USING 6MM X 10CM GORE-TEX GRAFT;  Surgeon: Elam Dutch, MD;  Location: [place];  Service: Vascular;  Laterality: Right;   REVISION OF ARTERIOVENOUS GORETEX GRAFT Right 01/30/2016   Procedure: REVISION OF RIGHT UPPER ARM ARTERIOVENOUS USING GORETEX GRAFT;  Surgeon: Angelia Mould, MD;  Location: [place];  Service: Vascular;  Laterality: Right;   THROMBECTOMY W/ EMBOLECTOMY Left 03/05/2013   Procedure: THROMBECTOMY OF LEFT BRACHIAL ARTERY;  Surgeon: Conrad [place],  MD;  Location: [place];  Service: Vascular;  Laterality: Left;   VENOGRAM Bilateral 03/25/2014   Procedure: VENOGRAM bilateral;  Surgeon: Conrad , MD;  Location: [facility];  Service: Cardiovascular;  Laterality: Bilateral;    Social History   Socioeconomic History   Marital status: Married    Spouse name: Not on file   Number of children: Not on file   Years of education: Not on file   Highest education level: Not on file  Occupational History   Not on file  Tobacco Use   Smoking status: Never   Smokeless tobacco: Never  Vaping Use   Vaping Use: Never used  Substance and Sexual Activity   Alcohol use: No    Alcohol/week: 0.0 standard drinks of alcohol   Drug use: No   Sexual activity: Not Currently    Comment: no birth control  Other Topics Concern   Not on file  Social History Narrative   Not on file   Social Determinants of Health   Financial Resource Strain: Not on file  Food Insecurity: No Food Insecurity (09/18/2019)   Hunger Vital Sign    Worried About Running Out of Food in the Last Year: Never true    Ran Out of Food in the Last Year: Never true  Transportation Needs: Unmet Transportation Needs (09/18/2019)   PRAPARE - Hydrologist (Medical): Yes    Lack of Transportation (Non-Medical): No  Physical Activity: Not on file  Stress: Not on file  Social Connections: Not on file  Intimate Partner Violence: Not on file    Family History  Problem Relation Age of Onset   Diabetes Mother    Hyperlipidemia Mother    Hypertension Mother    Kidney disease Mother    Anesthesia problems Neg Hx    Hypotension Neg Hx    Malignant hyperthermia Neg Hx    Pseudochol deficiency Neg Hx     Current Outpatient Medications  Medication Sig Dispense Refill   Aspirin-Caffeine (BAYER BACK & BODY) 500-32.5 MG TABS Take 2 tablets by mouth 2 (two) times daily as needed (pain.).     AURYXIA 1 GM 210 MG(Fe) tablet Take 630 mg by mouth See admin  instructions. Take 3 tablets (630 mg) by mouth with meals and snacks     cinacalcet (SENSIPAR) 60 MG tablet Take 60 mg by mouth in the morning.     cyclobenzaprine (FLEXERIL) 5 MG tablet Take 1 tablet (5 mg total) by mouth at bedtime. 10 tablet 0   HYDROcodone-acetaminophen (NORCO/VICODIN) 5-325 MG tablet Take 1 tablet by mouth every 6 (six) hours as needed for moderate pain. 20 tablet 0   megestrol (MEGACE) 20 MG tablet Take 2 tablets (40 mg total) by mouth daily. 60 tablet 11   predniSONE (DELTASONE) 20 MG tablet Take 2 tablets (40 mg total) by mouth daily. 10 tablet 0   PROAIR HFA 108 (90 Base) MCG/ACT inhaler Inhale 1-2 puffs into the lungs 3 (three) times daily as needed for wheezing or shortness of breath. For wheezing.  2   No current facility-administered medications for this visit.    Allergies  Allergen Reactions   Pollen Extract Other (See Comments)    Unknown   Oxycodone-Acetaminophen Itching    Tolerates with benadryl      REVIEW OF SYSTEMS:   '[X]'$  denotes positive finding, '[ ]'$  denotes negative finding Cardiac  Comments:  Chest pain or chest pressure:    Shortness of breath upon exertion:    Short of breath when lying flat:    Irregular heart rhythm:        Vascular    Pain in calf, thigh, or hip brought on by ambulation:    Pain in feet at night that wakes you up from your sleep:     Blood clot in your veins:    Leg swelling:         Pulmonary    Oxygen at home:    Productive cough:     Wheezing:         Neurologic    Sudden weakness in arms or legs:     Sudden numbness in arms or legs:     Sudden onset of difficulty speaking or slurred speech:    Temporary loss of vision in one eye:     Problems with dizziness:  Gastrointestinal    Blood in stool:     Vomited blood:         Genitourinary    Burning when urinating:     Blood in urine:        Psychiatric    Major depression:         Hematologic    Bleeding problems:    Problems with blood  clotting too easily:        Skin    Rashes or ulcers:        Constitutional    Fever or chills:      PHYSICAL EXAMINATION:  There were no vitals filed for this visit.  General:  WDWN in NAD; vital signs documented above Gait: Not observed HENT: WNL, normocephalic Pulmonary: normal non-labored breathing , without Rales, rhonchi,  wheezing Cardiac: regular HR Abdomen: soft, NT, no masses Skin: without rashes Vascular Exam/Pulses: Pulsatile flow near the anastomosis in the left arm up to the area of hematoma and ecchymosis; soft monophasic flow by Doppler beyond the area of hematoma Musculoskeletal: no muscle wasting or atrophy  Neurologic: A&O X 3;  No focal weakness or paresthesias are detected Psychiatric:  The pt has Normal affect.    ASSESSMENT/PLAN:: 46 y.o. female who walked into clinic for urgent evaluation of left arm AV fistula   -This patient is 3 months status post second stage basilic vein transposition of left arm.  She has a small hematoma after experiencing an infiltration event at hemodialysis treatment yesterday.  I believe this can be treated conservatively by resting the fistula and I am not concerned about skin overlying the hematoma.  However I am concerned about the quality of Doppler signal in the fistula beyond the hematoma.  This may result in thrombosis of her left arm AV fistula.  As mentioned above we will rest her fistula and continue HD via right IJ TDC for the next 2 to 3 weeks.  She will then return to office with fistula duplex to see if fistula has thrombosed or if it are still remains patent.  She is aware she may require conversion to a left arm AV graft.   Dagoberto Ligas, PA-C [facility] [phone]  Clinic MD:   [place]

## 2021-08-03 ENCOUNTER — Other Ambulatory Visit: Payer: Self-pay

## 2021-08-03 DIAGNOSIS — L299 Pruritus, unspecified: Secondary | ICD-10-CM | POA: Diagnosis not present

## 2021-08-03 DIAGNOSIS — N2581 Secondary hyperparathyroidism of renal origin: Secondary | ICD-10-CM | POA: Diagnosis not present

## 2021-08-03 DIAGNOSIS — T8249XS Other complication of vascular dialysis catheter, sequela: Secondary | ICD-10-CM | POA: Diagnosis not present

## 2021-08-03 DIAGNOSIS — T8249XA Other complication of vascular dialysis catheter, initial encounter: Secondary | ICD-10-CM | POA: Diagnosis not present

## 2021-08-03 DIAGNOSIS — N186 End stage renal disease: Secondary | ICD-10-CM | POA: Diagnosis not present

## 2021-08-03 DIAGNOSIS — N041 Nephrotic syndrome with focal and segmental glomerular lesions: Secondary | ICD-10-CM | POA: Diagnosis not present

## 2021-08-03 DIAGNOSIS — Z992 Dependence on renal dialysis: Secondary | ICD-10-CM | POA: Diagnosis not present

## 2021-08-03 DIAGNOSIS — D509 Iron deficiency anemia, unspecified: Secondary | ICD-10-CM | POA: Diagnosis not present

## 2021-08-03 DIAGNOSIS — D689 Coagulation defect, unspecified: Secondary | ICD-10-CM | POA: Diagnosis not present

## 2021-08-07 DIAGNOSIS — Z992 Dependence on renal dialysis: Secondary | ICD-10-CM | POA: Diagnosis not present

## 2021-08-07 DIAGNOSIS — N2581 Secondary hyperparathyroidism of renal origin: Secondary | ICD-10-CM | POA: Diagnosis not present

## 2021-08-07 DIAGNOSIS — R52 Pain, unspecified: Secondary | ICD-10-CM | POA: Diagnosis not present

## 2021-08-07 DIAGNOSIS — D509 Iron deficiency anemia, unspecified: Secondary | ICD-10-CM | POA: Diagnosis not present

## 2021-08-07 DIAGNOSIS — L299 Pruritus, unspecified: Secondary | ICD-10-CM | POA: Diagnosis not present

## 2021-08-07 DIAGNOSIS — T8249XA Other complication of vascular dialysis catheter, initial encounter: Secondary | ICD-10-CM | POA: Diagnosis not present

## 2021-08-07 DIAGNOSIS — T8249XS Other complication of vascular dialysis catheter, sequela: Secondary | ICD-10-CM | POA: Diagnosis not present

## 2021-08-07 DIAGNOSIS — D689 Coagulation defect, unspecified: Secondary | ICD-10-CM | POA: Diagnosis not present

## 2021-08-07 DIAGNOSIS — N186 End stage renal disease: Secondary | ICD-10-CM | POA: Diagnosis not present

## 2021-08-10 DIAGNOSIS — R52 Pain, unspecified: Secondary | ICD-10-CM | POA: Diagnosis not present

## 2021-08-10 DIAGNOSIS — Z992 Dependence on renal dialysis: Secondary | ICD-10-CM | POA: Diagnosis not present

## 2021-08-10 DIAGNOSIS — N186 End stage renal disease: Secondary | ICD-10-CM | POA: Diagnosis not present

## 2021-08-10 DIAGNOSIS — D689 Coagulation defect, unspecified: Secondary | ICD-10-CM | POA: Diagnosis not present

## 2021-08-10 DIAGNOSIS — T8249XS Other complication of vascular dialysis catheter, sequela: Secondary | ICD-10-CM | POA: Diagnosis not present

## 2021-08-10 DIAGNOSIS — D509 Iron deficiency anemia, unspecified: Secondary | ICD-10-CM | POA: Diagnosis not present

## 2021-08-10 DIAGNOSIS — N2581 Secondary hyperparathyroidism of renal origin: Secondary | ICD-10-CM | POA: Diagnosis not present

## 2021-08-10 DIAGNOSIS — L299 Pruritus, unspecified: Secondary | ICD-10-CM | POA: Diagnosis not present

## 2021-08-10 DIAGNOSIS — T8249XA Other complication of vascular dialysis catheter, initial encounter: Secondary | ICD-10-CM | POA: Diagnosis not present

## 2021-08-10 NOTE — Progress Notes (Unsigned)
Office Note     CC:  follow up Requesting Provider:  Glendale Chard, MD  HPI: [name] is a 46 y.o. (1975/07/04) female who presents for follow up of BV Fistula, which was infiltrated and had a surrounding hematoma at last visit. She is status post left second stage basilic vein fistula creation by Dr. Carlis Abbott on 04/13/2021. She returns today for follow up with duplex.   The pt *** on a statin for cholesterol management.  The pt *** on a daily aspirin.   Other AC:  *** The pt *** on *** for hypertension.   The pt *** diabetic.  *** Tobacco hx:  ***  Past Medical History:  Diagnosis Date   Anemia    Anxiety    pt not aware of this   Arthritis    Breast mass 09/01/2016   Targeted ultrasound is performed, showing a mass in the left breast at 11 o'clock, 1 cm from the nipple measuring 6.4 by 5.0 cm. The mass is heterogeneous and may contain macroscopic fat.   IMPRESSION: The mass in the left breast is probably benign, possibly a Hamartoma.  RECOMMENDATION: Recommend six-month follow-up mammogram of the probably benign left breast mass. If necessary, an ultrasound co   CHF (congestive heart failure) ([place])    hosp. 2011   Chronic kidney disease    Mon Wed Friday   Dilated cardiomyopathy ([place])    Encounter for blood transfusion    ESRD (end stage renal disease) ([place])    MWF - [place]   Family history of adverse reaction to anesthesia     " Aunt has a tracheostomy and kept coughing after colonoscopy "   Foot fracture    Gout    no flare up for several yrs   Headache    Heart murmur    as a child, no problems as an adult   Hemodialysis patient ([place])    History of blood transfusion    x 2   History of pneumonia    Hyperlipidemia    Hypertension    Menorrhagia    Morbid obesity ([place])    Pneumonia    hosp. 2010   Shortness of breath    HX CHL - occasional. 04/09/21 patient sits down and shortness of breath susides.   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   Thyroid disease     Past Surgical History:  Procedure Laterality Date   ANGIOPLASTY  07/28/2015   Procedure: ANGIOPLASTY OF RIGHT BRACHIAL VEIN;  Surgeon: Conrad [place], MD;  Location: [place];  Service: Vascular;;   AV FISTULA PLACEMENT  08/04/2011   Procedure: ARTERIOVENOUS (AV) FISTULA CREATION;  Surgeon: Elam Dutch, MD;  Location: [place];  Service: Vascular;  Laterality: Right;  Creation right brachiocephalic arteriovenous fistula   AV FISTULA PLACEMENT Left 02/05/2013   Procedure: ARTERIOVENOUS FISTULA CREATION LEFT ARM;  Surgeon: Conrad [place], MD;  Location: [place];  Service: Vascular;  Laterality: Left;   AV FISTULA PLACEMENT Right 06/04/2013   Procedure: ARTERIOVENOUS (AV) FISTULA CREATION;  Surgeon: Conrad [place], MD;  Location: [place];  Service: Vascular;  Laterality: Right;   AV FISTULA PLACEMENT Right 04/15/2014   Procedure: INSERTION OF ARTERIOVENOUS (AV) GORE-TEX GRAFT ARM;  Surgeon: Conrad [place]abb, MD;  Location: [place];  Service: Vascular;  Laterality: Right;  AV FISTULA PLACEMENT Left 02/25/2021   Procedure: LEFT ARM ARTERIOVENOUS (AV) FISTULA CREATION;  Surgeon: Marty Heck, MD;  Location: [place];  Service: Vascular;  Laterality: Left;   [place] Right 08/13/2013   Procedure: Second Stage Brachial Vein Transposition;  Surgeon: Conrad [place], MD;  Location: [place];  Service: Vascular;  Laterality: Right;   [place] Left 04/13/2021   Procedure: LEFT ARM SECOND STAGE BASILIC VEIN TRANSPOSITION;  Surgeon: Marty Heck, MD;  Location: [place];  Service: Vascular;  Laterality: Left;   CARDIAC SURGERY  1979   repair of hole in heart   INCISION AND DRAINAGE OF WOUND Right 07/28/2015   Procedure: IRRIGATION AND DEBRIDEMENT RIGHT HAND  WOUND;  Surgeon: Dayna Barker, MD;  Location: [place];  Service: Plastics;   Laterality: Right;   LIGATION OF ARTERIOVENOUS  FISTULA Left 03/05/2013   Procedure: LIGATION OF ARTERIOVENOUS  FISTULA;  Surgeon: Conrad Pound, MD;  Location: [place];  Service: Vascular;  Laterality: Left;   PATCH ANGIOPLASTY Left 03/05/2013   Procedure: PATCH ANGIOPLASTY;  Surgeon: Conrad [place], MD;  Location: [place];  Service: Vascular;  Laterality: Left;   PERIPHERAL VASCULAR CATHETERIZATION Right 10/31/2014   Procedure: Fistulagram;  Surgeon: Conrad [place]ston, MD;  Location: [place] [facility];  Service: Cardiovascular;  Laterality: Right;   PERIPHERAL VASCULAR CATHETERIZATION Right 10/31/2014   Procedure: Peripheral Vascular Balloon Angioplasty;  Surgeon: Conrad [place], MD;  Location: [place] [facility];  Service: Cardiovascular;  Laterality: Right;  pta venous rt arm   PERIPHERAL VASCULAR CATHETERIZATION N/A 03/13/2015   Procedure: A/V Shuntogram;  Surgeon: Conrad [place], MD;  Location: [place] [facility];  Service: Cardiovascular;  Laterality: N/A;   PERIPHERAL VASCULAR CATHETERIZATION Right 03/13/2015   Procedure: Peripheral Vascular Balloon Angioplasty;  Surgeon: Conrad Lamar, MD;  Location: [place] [facility];  Service: Cardiovascular;  Laterality: Right;  Shunt   PERIPHERAL VASCULAR CATHETERIZATION Right 01/19/2016   Procedure: A/V Shuntogram;  Surgeon: Angelia Mould, MD;  Location: [place] [facility];  Service: Cardiovascular;  Laterality: Right;   REVISION OF ARTERIOVENOUS GORETEX GRAFT Right 10/27/2015   Procedure: THROMBECTOMY AND REVISION OF RIGHT UPPER ARM  ARTERIOVENOUS GORETEX GRAFT USING 6MM X 10CM GORE-TEX GRAFT;  Surgeon: Elam Dutch, MD;  Location: [place];  Service: Vascular;  Laterality: Right;   REVISION OF ARTERIOVENOUS GORETEX GRAFT Right 01/30/2016   Procedure: REVISION OF RIGHT UPPER ARM ARTERIOVENOUS USING GORETEX GRAFT;  Surgeon: Angelia Mould, MD;  Location: [place];  Service: Vascular;  Laterality: Right;   THROMBECTOMY W/ EMBOLECTOMY Left 03/05/2013   Procedure:  THROMBECTOMY OF LEFT BRACHIAL ARTERY;  Surgeon: Conrad [place], MD;  Location: [place];  Service: Vascular;  Laterality: Left;   VENOGRAM Bilateral 03/25/2014   Procedure: VENOGRAM bilateral;  Surgeon: Conrad , MD;  Location: [facility];  Service: Cardiovascular;  Laterality: Bilateral;    Social History   Socioeconomic History   Marital status: Married    Spouse name: Not on file   Number of children: Not on file   Years of education: Not on file   Highest education level: Not on file  Occupational History   Not on file  Tobacco Use   Smoking status: Never   Smokeless tobacco: Never  Vaping Use   Vaping Use: Never used  Substance and Sexual Activity   Alcohol use: No    Alcohol/week: 0.0 standard drinks of alcohol  Drug use: No   Sexual activity: Not Currently    Comment: no birth control   Other Topics Concern   Not on file  Social History Narrative   Not on file   Social Determinants of Health   Financial Resource Strain: Not on file  Food Insecurity: No Food Insecurity (09/18/2019)   Hunger Vital Sign    Worried About Running Out of Food in the Last Year: Never true    Ran Out of Food in the Last Year: Never true  Transportation Needs: Unmet Transportation Needs (09/18/2019)   PRAPARE - Hydrologist (Medical): Yes    Lack of Transportation (Non-Medical): No  Physical Activity: Not on file  Stress: Not on file  Social Connections: Not on file  Intimate Partner Violence: Not on file   *** Family History  Problem Relation Age of Onset   Diabetes Mother    Hyperlipidemia Mother    Hypertension Mother    Kidney disease Mother    Anesthesia problems Neg Hx    Hypotension Neg Hx    Malignant hyperthermia Neg Hx    Pseudochol deficiency Neg Hx     Current Outpatient Medications  Medication Sig Dispense Refill   Aspirin-Caffeine (BAYER BACK & BODY) 500-32.5 MG TABS Take 2 tablets by mouth 2 (two) times daily as needed (pain.).      AURYXIA 1 GM 210 MG(Fe) tablet Take 630 mg by mouth See admin instructions. Take 3 tablets (630 mg) by mouth with meals and snacks     cinacalcet (SENSIPAR) 60 MG tablet Take 60 mg by mouth in the morning.     cyclobenzaprine (FLEXERIL) 5 MG tablet Take 1 tablet (5 mg total) by mouth at bedtime. 10 tablet 0   HYDROcodone-acetaminophen (NORCO/VICODIN) 5-325 MG tablet Take 1 tablet by mouth every 6 (six) hours as needed for moderate pain. 20 tablet 0   megestrol (MEGACE) 20 MG tablet Take 2 tablets (40 mg total) by mouth daily. 60 tablet 11   predniSONE (DELTASONE) 20 MG tablet Take 2 tablets (40 mg total) by mouth daily. 10 tablet 0   PROAIR HFA 108 (90 Base) MCG/ACT inhaler Inhale 1-2 puffs into the lungs 3 (three) times daily as needed for wheezing or shortness of breath. For wheezing.  2   No current facility-administered medications for this visit.    Allergies  Allergen Reactions   Pollen Extract Other (See Comments)    Unknown   Oxycodone-Acetaminophen Itching    Tolerates with benadryl      REVIEW OF SYSTEMS:  *** '[X]'$  denotes positive finding, '[ ]'$  denotes negative finding Cardiac  Comments:  Chest pain or chest pressure:    Shortness of breath upon exertion:    Short of breath when lying flat:    Irregular heart rhythm:        Vascular    Pain in calf, thigh, or hip brought on by ambulation:    Pain in feet at night that wakes you up from your sleep:     Blood clot in your veins:    Leg swelling:         Pulmonary    Oxygen at home:    Productive cough:     Wheezing:         Neurologic    Sudden weakness in arms or legs:     Sudden numbness in arms or legs:     Sudden onset of difficulty speaking or slurred speech:  Temporary loss of vision in one eye:     Problems with dizziness:         Gastrointestinal    Blood in stool:     Vomited blood:         Genitourinary    Burning when urinating:     Blood in urine:        Psychiatric    Major depression:          Hematologic    Bleeding problems:    Problems with blood clotting too easily:        Skin    Rashes or ulcers:        Constitutional    Fever or chills:      PHYSICAL EXAMINATION:  There were no vitals filed for this visit.  General:  WDWN in NAD; vital signs documented above Gait: Not observed HENT: WNL, normocephalic Pulmonary: normal non-labored breathing , without Rales, rhonchi,  wheezing Cardiac: {Desc; regular/irreg:14544} HR, without  Murmurs {With/Without:20273} carotid bruit*** Abdomen: soft, NT, no masses Skin: {With/Without:20273} rashes Vascular Exam/Pulses:  Right Left  Radial {Exam; arterial pulse strength 0-4:30167} {Exam; arterial pulse strength 0-4:30167}  Ulnar {Exam; arterial pulse strength 0-4:30167} {Exam; arterial pulse strength 0-4:30167}  Femoral {Exam; arterial pulse strength 0-4:30167} {Exam; arterial pulse strength 0-4:30167}  Popliteal {Exam; arterial pulse strength 0-4:30167} {Exam; arterial pulse strength 0-4:30167}  DP {Exam; arterial pulse strength 0-4:30167} {Exam; arterial pulse strength 0-4:30167}  PT {Exam; arterial pulse strength 0-4:30167} {Exam; arterial pulse strength 0-4:30167}   Extremities: {With/Without:20273} ischemic changes, {With/Without:20273} Gangrene , {With/Without:20273} cellulitis; {With/Without:20273} open wounds;  Musculoskeletal: no muscle wasting or atrophy  Neurologic: A&O X 3;  No focal weakness or paresthesias are detected Psychiatric:  The pt has {Desc; normal/abnormal:11317::"Normal"} affect.   Non-Invasive Vascular Imaging:   ***    ASSESSMENT/PLAN:: 46 y.o. female here for follow up for ***   -***   Karoline Caldwell, PA-C [facility] [phone]  Clinic MD:   ***

## 2021-08-11 ENCOUNTER — Ambulatory Visit (HOSPITAL_COMMUNITY)
Admission: RE | Admit: 2021-08-11 | Discharge: 2021-08-11 | Disposition: A | Discharge status: Home / Self Care | Payer: Medicare Other | Source: Ambulatory Visit | Attending: Vascular Surgery | Admitting: Vascular Surgery

## 2021-08-11 ENCOUNTER — Ambulatory Visit (INDEPENDENT_AMBULATORY_CARE_PROVIDER_SITE_OTHER): Payer: Medicare Other | Admitting: Physician Assistant

## 2021-08-11 ENCOUNTER — Other Ambulatory Visit: Payer: Self-pay

## 2021-08-11 ENCOUNTER — Encounter: Payer: Self-pay | Admitting: Physician Assistant

## 2021-08-11 VITALS — BP 135/88 | HR 70 | Temp 97.2°F | Resp 20 | Ht 67.0 in | Wt 305.9 lb

## 2021-08-11 DIAGNOSIS — Z992 Dependence on renal dialysis: Secondary | ICD-10-CM | POA: Insufficient documentation

## 2021-08-11 DIAGNOSIS — N186 End stage renal disease: Secondary | ICD-10-CM

## 2021-08-13 ENCOUNTER — Encounter (HOSPITAL_COMMUNITY)
Admission: RE | Disposition: A | Discharge status: Home / Self Care | Payer: Self-pay | Source: Ambulatory Visit | Attending: Vascular Surgery

## 2021-08-13 ENCOUNTER — Ambulatory Visit (HOSPITAL_COMMUNITY)
Admission: RE | Admit: 2021-08-13 | Discharge: 2021-08-13 | Disposition: A | Discharge status: Home / Self Care | Payer: Medicare Other | Source: Ambulatory Visit | Attending: Vascular Surgery | Admitting: Vascular Surgery

## 2021-08-13 ENCOUNTER — Other Ambulatory Visit: Payer: Self-pay

## 2021-08-13 DIAGNOSIS — Y841 Kidney dialysis as the cause of abnormal reaction of the patient, or of later complication, without mention of misadventure at the time of the procedure: Secondary | ICD-10-CM | POA: Insufficient documentation

## 2021-08-13 DIAGNOSIS — T82858A Stenosis of vascular prosthetic devices, implants and grafts, initial encounter: Secondary | ICD-10-CM | POA: Diagnosis not present

## 2021-08-13 DIAGNOSIS — N186 End stage renal disease: Secondary | ICD-10-CM | POA: Insufficient documentation

## 2021-08-13 DIAGNOSIS — Z992 Dependence on renal dialysis: Secondary | ICD-10-CM | POA: Insufficient documentation

## 2021-08-13 DIAGNOSIS — N185 Chronic kidney disease, stage 5: Secondary | ICD-10-CM | POA: Diagnosis not present

## 2021-08-13 DIAGNOSIS — I132 Hypertensive heart and chronic kidney disease with heart failure and with stage 5 chronic kidney disease, or end stage renal disease: Secondary | ICD-10-CM | POA: Insufficient documentation

## 2021-08-13 DIAGNOSIS — I509 Heart failure, unspecified: Secondary | ICD-10-CM | POA: Insufficient documentation

## 2021-08-13 DIAGNOSIS — T82898A Other specified complication of vascular prosthetic devices, implants and grafts, initial encounter: Secondary | ICD-10-CM | POA: Diagnosis not present

## 2021-08-13 HISTORY — PX: A/V FISTULAGRAM: CATH118298

## 2021-08-13 HISTORY — PX: PERIPHERAL VASCULAR BALLOON ANGIOPLASTY: CATH118281

## 2021-08-13 LAB — POCT I-STAT, CHEM 8
BUN: 44 mg/dL — ABNORMAL HIGH (ref 6–20)
Calcium, Ion: 1.21 mmol/L (ref 1.15–1.40)
Chloride: 100 mmol/L (ref 98–111)
Creatinine, Ser: 15.1 mg/dL — ABNORMAL HIGH (ref 0.44–1.00)
Glucose, Bld: 98 mg/dL (ref 70–99)
HCT: 34 % — ABNORMAL LOW (ref 36.0–46.0)
Hemoglobin: 11.6 g/dL — ABNORMAL LOW (ref 12.0–15.0)
Potassium: 3.7 mmol/L (ref 3.5–5.1)
Sodium: 137 mmol/L (ref 135–145)
TCO2: 28 mmol/L (ref 22–32)

## 2021-08-13 SURGERY — A/V FISTULAGRAM
Anesthesia: LOCAL | Laterality: Left

## 2021-08-13 MED ORDER — IODIXANOL 320 MG/ML IV SOLN
INTRAVENOUS | Status: DC | PRN
  Administered 2021-08-13: 50 mL

## 2021-08-13 MED ORDER — SODIUM CHLORIDE 0.9 % IV SOLN: 250.0000 mL | INTRAVENOUS | Status: DC | PRN

## 2021-08-13 MED ORDER — SODIUM CHLORIDE 0.9% FLUSH: 3.0000 mL | INTRAVENOUS | Status: DC | PRN

## 2021-08-13 MED ORDER — LIDOCAINE HCL (PF) 1 % IJ SOLN
INTRAMUSCULAR | Status: DC | PRN
  Administered 2021-08-13: 5 mL

## 2021-08-13 MED ORDER — HEPARIN (PORCINE) IN NACL 1000-0.9 UT/500ML-% IV SOLN
INTRAVENOUS | Status: DC | PRN
  Administered 2021-08-13: 500 mL

## 2021-08-13 MED ORDER — LIDOCAINE HCL (PF) 1 % IJ SOLN
INTRAMUSCULAR | Status: AC
  Filled 2021-08-13: qty 30

## 2021-08-13 MED ORDER — SODIUM CHLORIDE 0.9% FLUSH: 3.0000 mL | Freq: Two times a day (BID) | INTRAVENOUS | Status: DC

## 2021-08-13 MED ORDER — HEPARIN SODIUM (PORCINE) 1000 UNIT/ML IJ SOLN
INTRAMUSCULAR | Status: AC
  Filled 2021-08-13: qty 10

## 2021-08-13 MED ORDER — HEPARIN (PORCINE) IN NACL 1000-0.9 UT/500ML-% IV SOLN
INTRAVENOUS | Status: AC
  Filled 2021-08-13: qty 500

## 2021-08-13 MED ORDER — HEPARIN SODIUM (PORCINE) 1000 UNIT/ML IJ SOLN
INTRAMUSCULAR | Status: DC | PRN
  Administered 2021-08-13: 5000 [IU] via INTRAVENOUS

## 2021-08-13 SURGICAL SUPPLY — 16 items
BAG SNAP BAND KOVER 36X36 (MISCELLANEOUS) ×3 IMPLANT
BALLN MUSTANG 6.0X100X75 (BALLOONS) ×2
BALLN MUSTANG 6.0X20 75 (BALLOONS) ×2
BALLOON MUSTANG 6.0X100X75 (BALLOONS) IMPLANT
BALLOON MUSTANG 6.0X20 75 (BALLOONS) IMPLANT
COVER DOME SNAP 22 D (MISCELLANEOUS) ×3 IMPLANT
KIT ENCORE 26 ADVANTAGE (KITS) ×1 IMPLANT
KIT MICROPUNCTURE NIT STIFF (SHEATH) ×1 IMPLANT
PROTECTION STATION PRESSURIZED (MISCELLANEOUS) ×2
SHEATH PINNACLE R/O II 6F 4CM (SHEATH) ×1 IMPLANT
SHEATH PROBE COVER 6X72 (BAG) ×4 IMPLANT
STATION PROTECTION PRESSURIZED (MISCELLANEOUS) ×2 IMPLANT
STOPCOCK MORSE 400PSI 3WAY (MISCELLANEOUS) ×3 IMPLANT
TRAY PV CATH (CUSTOM PROCEDURE TRAY) ×3 IMPLANT
TUBING CIL FLEX 10 FLL-RA (TUBING) ×3 IMPLANT
WIRE BENTSON .035X145CM (WIRE) ×1 IMPLANT

## 2021-08-13 NOTE — Op Note (Signed)
    OPERATIVE NOTE   PROCEDURE: left brachiobasilic arteriovenous fistula cannulation under ultrasound guidance left arm fistulogram and central venogram left basilic vein angioplasty (6 mm Mustang)   PRE-OPERATIVE DIAGNOSIS: Malfunctioning left arteriovenous fistula  POST-OPERATIVE DIAGNOSIS: same as above   SURGEON: Marty Heck, MD  ANESTHESIA: local  ESTIMATED BLOOD LOSS: 5 cc  FINDING(S): The left arm basilic vein is severely diseased with a 99% stenosis in the distal upper arm adjacent to the arterial anastomosis.  There is also some additional disease with 50% stenosis in the proximal upper arm.  All of this was crossed through antegrade access and angioplastied with a 6 mm Mustang.  Significant residual disease and I do not feel this will be a long-term durable option.  I recommend converting her to a left upper arm AV graft.  We will get scheduled in the next 2 to 3 weeks.  SPECIMEN(S):  None  CONTRAST: 50 mL  INDICATIONS: [name] is a 46 y.o. female who  presents with malfunctioning left brachiobasilic arteriovenous fistula.  The patient is scheduled for left arm fistulogram.  The patient is aware the risks include but are not limited to: bleeding, infection, thrombosis of the cannulated access, and possible anaphylactic reaction to the contrast.  The patient is aware of the risks of the procedure and elects to proceed forward.  DESCRIPTION: After full informed written consent was obtained, the patient was brought back to the angiography suite and placed supine upon the angiography table.  The patient was connected to monitoring equipment.  The left arm was prepped and draped in the standard fashion for a left arm fistulogram.  Under ultrasound guidance, the left arm arteriovenous fistula was evaluated, it was patent, an image was saved.  It was cannulated with a micropuncture needle.  The microwire was advanced into the fistula and the needle was exchanged  for the a microsheath, which was lodged 2 cm into the access.  The wire was removed and the sheath was connected to the IV extension tubing.  Hand injections were completed to image the access from the antecubitum up to the level of axilla.  The central venous structures were also imaged by hand injections.  Ultimately elected for intervention.  Bentson wire was placed and we exchanged for a short 6 [place] sheath.  Patient was given 5000 units of IV heparin.  We then treated the 99% stenosis in the distal upper arm as well as the stenosis in the proximal upper arm.  My concern is that we had a very poor result from intervention on the 99% stenosis with significant recoil and residual disease in the vein.  Although we now have a thrill I do not think this will be a durable option for her.  I recommended converting her to an upper arm graft.  A 4-0 Monocryl purse-string suture was sewn around the sheath.  The sheath was removed while tying down the suture.  A sterile bandage was applied to the puncture site.  COMPLICATIONS: None  CONDITION: Stable  Marty Heck, MD [facility] Office: [place]   08/13/2021 1:49 PM

## 2021-08-13 NOTE — H&P (Signed)
History and Physical Interval Note:  08/13/2021 11:31 AM  [name]  has presented today for surgery, with the diagnosis of end stage renal disease.  The various methods of treatment have been discussed with the patient and family. After consideration of risks, benefits and other options for treatment, the patient has consented to  Procedure(s): A/V Fistulagram (Left) as a surgical intervention.  The patient's history has been reviewed, patient examined, no change in status, stable for surgery.  I have reviewed the patient's chart and labs.  Questions were answered to the patient's satisfaction.     Marty Heck  Office Note        CC:  follow up Requesting Provider:  Glendale Chard, MD   HPI: [name] is a 46 y.o. ([birth year]) female who presents for follow up of BV Fistula, which was infiltrated and had a surrounding hematoma at last visit. She is status post left second stage basilic vein fistula creation by Dr. Carlis Abbott on 04/13/2021. She returns today for follow up with duplex.      She states her arm feels better and all of the swelling has gone down. She endorses some random "stabbing" nerve pains that shoot up her left arm. This pain is not brought on by any activities and goes away quickly. She denies any issues with dialysis currently with her right IJ TDC. She denies any pain in her left hand, numbness, or difficulty with grip strength.   She dialyzes on MWF at the [facility] dialysis location.   The pt is not on a statin for cholesterol management.  The pt is not on a daily aspirin.  The pt is not on medication for hypertension.   The pt is not diabetic.   Tobacco hx:  never       Past Medical History:  Diagnosis Date   Anemia     Anxiety      pt not aware of this   Arthritis     Breast mass 09/01/2016    Targeted ultrasound is performed, showing a mass in the left breast at 11 o'clock, 1 cm from the nipple measuring 6.4 by 5.0 cm. The mass is  heterogeneous and may contain macroscopic fat.   IMPRESSION: The mass in the left breast is probably benign, possibly a Hamartoma.  RECOMMENDATION: Recommend six-month follow-up mammogram of the probably benign left breast mass. If necessary, an ultrasound co   CHF (congestive heart failure) ([place])      hosp. 2011   Chronic kidney disease      Mon Wed Friday   Dilated cardiomyopathy ([place])     Encounter for blood transfusion     ESRD (end stage renal disease) ([place])      MWF - [place]   Family history of adverse reaction to anesthesia       " Aunt has a tracheostomy and kept coughing after colonoscopy "   Foot fracture     Gout      no flare up for several yrs   Headache     Heart murmur      as a child, no problems as an adult   Hemodialysis patient ([place])     History of blood transfusion      x 2   History of pneumonia     Hyperlipidemia     Hypertension     Menorrhagia     Morbid obesity ([place])     Pneumonia  hosp. 2010   Shortness of breath      HX CHL - occasional. 04/09/21 patient sits down and shortness of breath susides.   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   Thyroid disease             Past Surgical History:  Procedure Laterality Date   ANGIOPLASTY   07/28/2015    Procedure: ANGIOPLASTY OF RIGHT BRACHIAL VEIN;  Surgeon: Conrad Bagtown, MD;  Location: [place];  Service: Vascular;;   AV FISTULA PLACEMENT   08/04/2011    Procedure: ARTERIOVENOUS (AV) FISTULA CREATION;  Surgeon: Elam Dutch, MD;  Location: [place];  Service: Vascular;  Laterality: Right;  Creation right brachiocephalic arteriovenous fistula   AV FISTULA PLACEMENT Left 02/05/2013    Procedure: ARTERIOVENOUS FISTULA CREATION LEFT ARM;  Surgeon: Conrad Cushman, MD;  Location: [place];  Service: Vascular;  Laterality: Left;   AV FISTULA PLACEMENT Right 06/04/2013     Procedure: ARTERIOVENOUS (AV) FISTULA CREATION;  Surgeon: Conrad Nazlini, MD;  Location: [place];  Service: Vascular;  Laterality: Right;   AV FISTULA PLACEMENT Right 04/15/2014    Procedure: INSERTION OF ARTERIOVENOUS (AV) GORE-TEX GRAFT ARM;  Surgeon: Conrad Seville, MD;  Location: [place];  Service: Vascular;  Laterality: Right;   AV FISTULA PLACEMENT Left 02/25/2021    Procedure: LEFT ARM ARTERIOVENOUS (AV) FISTULA CREATION;  Surgeon: Marty Heck, MD;  Location: [place];  Service: Vascular;  Laterality: Left;   [place] Right 08/13/2013    Procedure: Second Stage Brachial Vein Transposition;  Surgeon: Conrad Tolstoy, MD;  Location: [place];  Service: Vascular;  Laterality: Right;   [place] Left 04/13/2021    Procedure: LEFT ARM SECOND STAGE BASILIC VEIN TRANSPOSITION;  Surgeon: Marty Heck, MD;  Location: [place];  Service: Vascular;  Laterality: Left;   CARDIAC SURGERY   1979    repair of hole in heart   INCISION AND DRAINAGE OF WOUND Right 07/28/2015    Procedure: IRRIGATION AND DEBRIDEMENT RIGHT HAND  WOUND;  Surgeon: Dayna Barker, MD;  Location: [place];  Service: Plastics;  Laterality: Right;   LIGATION OF ARTERIOVENOUS  FISTULA Left 03/05/2013    Procedure: LIGATION OF ARTERIOVENOUS  FISTULA;  Surgeon: Conrad [place], MD;  Location: [place];  Service: Vascular;  Laterality: Left;   PATCH ANGIOPLASTY Left 03/05/2013    Procedure: PATCH ANGIOPLASTY;  Surgeon: Conrad Hamilton, MD;  Location: [place];  Service: Vascular;  Laterality: Left;   PERIPHERAL VASCULAR CATHETERIZATION Right 10/31/2014    Procedure: Fistulagram;  Surgeon: Conrad [place], MD;  Location: [place] CV LAB;  Service: Cardiovascular;  Laterality: Right;   PERIPHERAL VASCULAR CATHETERIZATION Right 10/31/2014    Procedure: Peripheral Vascular Balloon Angioplasty;  Surgeon: Conrad [place], MD;  Location: [place] CV [facility];  Service: Cardiovascular;  Laterality: Right;  pta venous rt arm   PERIPHERAL  VASCULAR CATHETERIZATION N/A 03/13/2015    Procedure: A/V Shuntogram;  Surgeon: Conrad [place], MD;  Location: [place] CV LAB;  Service: Cardiovascular;  Laterality: N/A;   PERIPHERAL VASCULAR CATHETERIZATION Right 03/13/2015    Procedure: Peripheral Vascular Balloon Angioplasty;  Surgeon: Conrad Stratford, MD;  Location: [place] CV LAB;  Service: Cardiovascular;  Laterality: Right;  Shunt   PERIPHERAL VASCULAR CATHETERIZATION Right 01/19/2016  Procedure: A/V Shuntogram;  Surgeon: Angelia Mould, MD;  Location: [place] CV LAB;  Service: Cardiovascular;  Laterality: Right;   REVISION OF ARTERIOVENOUS GORETEX GRAFT Right 10/27/2015    Procedure: THROMBECTOMY AND REVISION OF RIGHT UPPER ARM  ARTERIOVENOUS GORETEX GRAFT USING 6MM X 10CM GORE-TEX GRAFT;  Surgeon: Elam Dutch, MD;  Location: [place];  Service: Vascular;  Laterality: Right;   REVISION OF ARTERIOVENOUS GORETEX GRAFT Right 01/30/2016    Procedure: REVISION OF RIGHT UPPER ARM ARTERIOVENOUS USING GORETEX GRAFT;  Surgeon: Angelia Mould, MD;  Location: [place];  Service: Vascular;  Laterality: Right;   THROMBECTOMY W/ EMBOLECTOMY Left 03/05/2013    Procedure: THROMBECTOMY OF LEFT BRACHIAL ARTERY;  Surgeon: Conrad [place], MD;  Location: [place];  Service: Vascular;  Laterality: Left;   VENOGRAM Bilateral 03/25/2014    Procedure: VENOGRAM bilateral;  Surgeon: Conrad [place], MD;  Location[facility];  Service: Cardiovascular;  Laterality: Bilateral;      Social History         Socioeconomic History   Marital status: Married      Spouse name: Not on file   Number of children: Not on file   Years of education: Not on file   Highest education level: Not on file  Occupational History   Not on file  Tobacco Use   Smoking status: Never   Smokeless tobacco: Never  Vaping Use   Vaping Use: Never used  Substance and Sexual Activity   Alcohol use: No      Alcohol/week: 0.0 standard drinks of alcohol   Drug use: No   Sexual  activity: Not Currently      Comment: no birth control   Other Topics Concern   Not on file  Social History Narrative   Not on file    Social Determinants of Health        Financial Resource Strain: Not on file  Food Insecurity: No Food Insecurity (09/18/2019)    Hunger Vital Sign     Worried About Running Out of Food in the Last Year: Never true     Ran Out of Food in the Last Year: Never true  Transportation Needs: Unmet Transportation Needs (09/18/2019)    PRAPARE - Armed forces logistics/support/administrative officer (Medical): Yes     Lack of Transportation (Non-Medical): No  Physical Activity: Not on file  Stress: Not on file  Social Connections: Not on file  Intimate Partner Violence: Not on file           Family History  Problem Relation Age of Onset   Diabetes Mother     Hyperlipidemia Mother     Hypertension Mother     Kidney disease Mother     Anesthesia problems Neg Hx     Hypotension Neg Hx     Malignant hyperthermia Neg Hx     Pseudochol deficiency Neg Hx              Current Outpatient Medications  Medication Sig Dispense Refill   Aspirin-Caffeine (BAYER BACK & BODY) 500-32.5 MG TABS Take 2 tablets by mouth 2 (two) times daily as needed (pain.).       AURYXIA 1 GM 210 MG(Fe) tablet Take 630 mg by mouth See admin instructions. Take 3 tablets (630 mg) by mouth with meals and snacks       cinacalcet (SENSIPAR) 60 MG tablet Take 60 mg by mouth in the morning.  cyclobenzaprine (FLEXERIL) 5 MG tablet Take 1 tablet (5 mg total) by mouth at bedtime. 10 tablet 0   HYDROcodone-acetaminophen (NORCO/VICODIN) 5-325 MG tablet Take 1 tablet by mouth every 6 (six) hours as needed for moderate pain. 20 tablet 0   megestrol (MEGACE) 20 MG tablet Take 2 tablets (40 mg total) by mouth daily. 60 tablet 11   predniSONE (DELTASONE) 20 MG tablet Take 2 tablets (40 mg total) by mouth daily. 10 tablet 0   PROAIR HFA 108 (90 Base) MCG/ACT inhaler Inhale 1-2 puffs into the lungs 3 (three)  times daily as needed for wheezing or shortness of breath. For wheezing.   2    No current facility-administered medications for this visit.           Allergies  Allergen Reactions   Pollen Extract Other (See Comments)      Unknown   Oxycodone-Acetaminophen Itching      Tolerates with benadryl         REVIEW OF SYSTEMS:    '[X]'$  denotes positive finding, '[ ]'$  denotes negative finding Cardiac   Comments:  Chest pain or chest pressure:      Shortness of breath upon exertion:      Short of breath when lying flat:      Irregular heart rhythm:             Vascular      Pain in calf, thigh, or hip brought on by ambulation:      Pain in feet at night that wakes you up from your sleep:       Blood clot in your veins:      Leg swelling:              Pulmonary      Oxygen at home:      Productive cough:       Wheezing:              Neurologic      Sudden weakness in arms or legs:       Sudden numbness in arms or legs:       Sudden onset of difficulty speaking or slurred speech:      Temporary loss of vision in one eye:       Problems with dizziness:              Gastrointestinal      Blood in stool:       Vomited blood:              Genitourinary      Burning when urinating:       Blood in urine:             Psychiatric      Major depression:              Hematologic      Bleeding problems:      Problems with blood clotting too easily:             Skin      Rashes or ulcers:             Constitutional      Fever or chills:          PHYSICAL EXAMINATION:      Vitals:    08/11/21 0942  BP: 135/88  Pulse: 70  Resp: 20  Temp: (!) 97.2 F (36.2 C)  TempSrc: Temporal  SpO2: 95%  Weight: (!) 305 lb 14.4  oz (138.8 kg)  Height: '5\' 7"'$  (1.702 m)      General:  WDWN in NAD; vital signs documented above Gait: Not observed HENT: WNL, normocephalic Pulmonary: normal non-labored breathing , without Rales, rhonchi,  wheezing Cardiac: regular HR, without murmurs  without carotid bruit Abdomen: soft, NT, no masses Skin: without rashes Vascular Exam/Pulses: 2+ radial pulse bilaterally. Mid left upper extremity an aneurysmal area of fistula can be palpated and is slightly firm. Distal to aneurysmal area, fistula has a palpable thrill and good bruit on auscultation. Proximal to aneurysmal area, fistula does not have a palpable thrill and has a very soft bruit on auscultation Extremities: without ischemic changes, without Gangrene , without cellulitis; without open wounds;  Musculoskeletal: no muscle wasting or atrophy       Neurologic: A&O X 3;  No focal weakness or paresthesias are detected Psychiatric:  The pt has Normal affect.     Non-Invasive Vascular Imaging:   LUE Fistula Duplex +--------------------+----------+-----------------+--------+  AVF                 PSV (cm/s)Flow Vol (mL/min)Comments  +--------------------+----------+-----------------+--------+  Native artery inflow   140           619                 +--------------------+----------+-----------------+--------+  AVF Anastomosis        215                               +--------------------+----------+-----------------+--------+      +------------+----------+--------------+----------+--------+  OUTFLOW VEINPSV (cm/s)Diameter (cm) Depth (cm)Describe  +------------+----------+--------------+----------+--------+  Prox UA         49         0.63        0.92             +------------+----------+--------------+----------+--------+  Mid UA          46    0.66>1.16>0.65   0.12             +------------+----------+--------------+----------+--------+  Dist UA        433         0.36        0.73             +------------+----------+--------------+----------+--------+  AC Fossa       220         0.36        0.69             +------------+----------+--------------+----------+--------+        ASSESSMENT/PLAN:: 46 y.o. female here for follow up for  left upper arm hematoma and evaluation of basilic fistula patency     -Patient still has some resolving skin bruising from her hematoma. Swelling from the hematoma has gone down significantly since the last visit -Per duplex study today, the patient has reduced inflow at 686m/min vs 1200 from her last duplex on 04/07/21. 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 These reduced velocities occur proximal to the aneurysmal area of the fistula. The fistula has soft, monophasic flow on auscultation proximal to the aneurysm -The reduced flow in the proximal fistula with a new aneurysmal area is concerning for the patency of this fistula. Currently it is patent, however it is at risk for thrombosis.  -  We will schedule this patient for a LUE fistulagram with Dr.Syble Picco within the next couple of weeks to further assess fistula patency. For the time being, the patient can continue to use her right IJ [facility] for dialysis.   Vicente Serene, PA-C [facility] [phone]   Clinic MD:   Roxanne Mins

## 2021-08-14 ENCOUNTER — Telehealth: Payer: Self-pay | Admitting: Vascular Surgery

## 2021-08-14 ENCOUNTER — Encounter (HOSPITAL_COMMUNITY): Payer: Self-pay | Admitting: Vascular Surgery

## 2021-08-14 DIAGNOSIS — L299 Pruritus, unspecified: Secondary | ICD-10-CM | POA: Diagnosis not present

## 2021-08-14 DIAGNOSIS — D689 Coagulation defect, unspecified: Secondary | ICD-10-CM | POA: Diagnosis not present

## 2021-08-14 DIAGNOSIS — T8249XA Other complication of vascular dialysis catheter, initial encounter: Secondary | ICD-10-CM | POA: Diagnosis not present

## 2021-08-14 DIAGNOSIS — Z992 Dependence on renal dialysis: Secondary | ICD-10-CM | POA: Diagnosis not present

## 2021-08-14 DIAGNOSIS — D509 Iron deficiency anemia, unspecified: Secondary | ICD-10-CM | POA: Diagnosis not present

## 2021-08-14 DIAGNOSIS — T8249XS Other complication of vascular dialysis catheter, sequela: Secondary | ICD-10-CM | POA: Diagnosis not present

## 2021-08-14 DIAGNOSIS — N2581 Secondary hyperparathyroidism of renal origin: Secondary | ICD-10-CM | POA: Diagnosis not present

## 2021-08-14 DIAGNOSIS — N186 End stage renal disease: Secondary | ICD-10-CM | POA: Diagnosis not present

## 2021-08-14 DIAGNOSIS — R52 Pain, unspecified: Secondary | ICD-10-CM | POA: Diagnosis not present

## 2021-08-14 NOTE — Telephone Encounter (Signed)
-----   Message from Marty Heck, MD sent at 08/13/2021  1:55 PM EDT -----  OPERATIVE NOTE   PROCEDURE: 1. left brachiobasilic arteriovenous fistula cannulation under ultrasound guidance 2. left arm fistulogram and central venogram 3. left basilic vein angioplasty (6 mm Mustang)   PRE-OPERATIVE DIAGNOSIS: Malfunctioning left arteriovenous fistula  POST-OPERATIVE DIAGNOSIS: same as above   SURGEON: Marty Heck, MD  #Can you schedule for left arm AV graft with me in the next 2-3 weeks or when patient is available?  Thanks,  Gerald Stabs

## 2021-08-17 ENCOUNTER — Ambulatory Visit: Payer: Self-pay

## 2021-08-17 DIAGNOSIS — Z992 Dependence on renal dialysis: Secondary | ICD-10-CM | POA: Diagnosis not present

## 2021-08-17 DIAGNOSIS — D689 Coagulation defect, unspecified: Secondary | ICD-10-CM | POA: Diagnosis not present

## 2021-08-17 DIAGNOSIS — L299 Pruritus, unspecified: Secondary | ICD-10-CM | POA: Diagnosis not present

## 2021-08-17 DIAGNOSIS — N2581 Secondary hyperparathyroidism of renal origin: Secondary | ICD-10-CM | POA: Diagnosis not present

## 2021-08-17 DIAGNOSIS — D509 Iron deficiency anemia, unspecified: Secondary | ICD-10-CM | POA: Diagnosis not present

## 2021-08-17 DIAGNOSIS — T8249XA Other complication of vascular dialysis catheter, initial encounter: Secondary | ICD-10-CM | POA: Diagnosis not present

## 2021-08-17 DIAGNOSIS — T8249XS Other complication of vascular dialysis catheter, sequela: Secondary | ICD-10-CM | POA: Diagnosis not present

## 2021-08-17 DIAGNOSIS — N186 End stage renal disease: Secondary | ICD-10-CM | POA: Diagnosis not present

## 2021-08-17 DIAGNOSIS — R52 Pain, unspecified: Secondary | ICD-10-CM | POA: Diagnosis not present

## 2021-08-17 NOTE — Patient Outreach (Signed)
  Care Coordination   Initial Visit Note   08/17/2021 Name: MONYA KOZAKIEWICZ MRN: [id number] DOB: 12/02/1975  Thea Silversmith is a 46 y.o. year old female who sees [name], MD for primary care. I spoke with  Thea Silversmith by phone today  What matters to the patients health and wellness today?  Health Maintenance      SDOH assessments and interventions completed:  Yes  SDOH Interventions Today    Flowsheet Row Most Recent Value  SDOH Interventions   Food Insecurity Interventions Intervention Not Indicated  Transportation Interventions Intervention Not Indicated        Care Coordination Interventions Activated:  Yes  Care Coordination Interventions:  Yes, provided   Follow up plan: A member of the care management team will follow up next month.  Encounter Outcome:  Pt. Visit Completed    [facility] Care Management [phone]

## 2021-08-17 NOTE — Patient Instructions (Signed)
Visit Information Thank you for allowing the  Care Management team to participate in your care. It was great speaking with you!   Following are the goals we discussed today:        Ms. [name] verbalized understanding of the information discussed during the telephonic outreach. Declined need for mailed instructions or resources.   A member of the care management team will follow up next month.  [place] Management [phone]

## 2021-08-18 ENCOUNTER — Other Ambulatory Visit: Payer: Self-pay

## 2021-08-18 DIAGNOSIS — N186 End stage renal disease: Secondary | ICD-10-CM

## 2021-08-19 DIAGNOSIS — R52 Pain, unspecified: Secondary | ICD-10-CM | POA: Diagnosis not present

## 2021-08-19 DIAGNOSIS — Z992 Dependence on renal dialysis: Secondary | ICD-10-CM | POA: Diagnosis not present

## 2021-08-19 DIAGNOSIS — T8249XA Other complication of vascular dialysis catheter, initial encounter: Secondary | ICD-10-CM | POA: Diagnosis not present

## 2021-08-19 DIAGNOSIS — D689 Coagulation defect, unspecified: Secondary | ICD-10-CM | POA: Diagnosis not present

## 2021-08-19 DIAGNOSIS — T8249XS Other complication of vascular dialysis catheter, sequela: Secondary | ICD-10-CM | POA: Diagnosis not present

## 2021-08-19 DIAGNOSIS — L299 Pruritus, unspecified: Secondary | ICD-10-CM | POA: Diagnosis not present

## 2021-08-19 DIAGNOSIS — N2581 Secondary hyperparathyroidism of renal origin: Secondary | ICD-10-CM | POA: Diagnosis not present

## 2021-08-19 DIAGNOSIS — D509 Iron deficiency anemia, unspecified: Secondary | ICD-10-CM | POA: Diagnosis not present

## 2021-08-19 DIAGNOSIS — N186 End stage renal disease: Secondary | ICD-10-CM | POA: Diagnosis not present

## 2021-08-25 ENCOUNTER — Other Ambulatory Visit: Payer: Self-pay

## 2021-08-25 NOTE — Progress Notes (Signed)
[name]  denies chest pain or shortness of breath. Patient denies having any s/s of Covid in her household, also denies any known exposure to Covid.   [name]'s PCP is Dr. Glendale Chard

## 2021-08-25 NOTE — Anesthesia Preprocedure Evaluation (Addendum)
Anesthesia Evaluation  Patient identified by MRN, date of birth, ID band Patient awake    Reviewed: Allergy & Precautions, NPO status , Patient's Chart, lab work & pertinent test results  History of Anesthesia Complications Negative for: history of anesthetic complications  Airway Mallampati: II  TM Distance: >3 FB Neck ROM: Full    Dental  (+) Poor Dentition, Dental Advisory Given   Pulmonary asthma ,    breath sounds clear to auscultation       Cardiovascular hypertension, (-) angina+ Peripheral Vascular Disease and +CHF   Rhythm:Regular Rate:Normal  '15 ECHO: LV cavity size was mildly dilated, severe concentric LVH. Systolic function wasmildly reduced. EF 45-50%. Wall motion was normal; no regional wall motion abnormalities, grade 2 DD, mild MR    Neuro/Psych  Headaches, Anxiety    GI/Hepatic negative GI ROS, Neg liver ROS,   Endo/Other  Morbid obesity  Renal/GU ESRF and DialysisRenal disease (MWF, K+ 3.5)     Musculoskeletal  (+) Arthritis ,   Abdominal (+) + obese,   Peds  Hematology negative hematology ROS (+)   Anesthesia Other Findings   Reproductive/Obstetrics                           Anesthesia Physical Anesthesia Plan  ASA: 4  Anesthesia Plan: MAC   Post-op Pain Management: Tylenol PO (pre-op)*   Induction:   PONV Risk Score and Plan: 2 and Ondansetron and Treatment may vary due to age or medical condition  Airway Management Planned: Natural Airway and Simple Face Mask  Additional Equipment: None  Intra-op Plan:   Post-operative Plan:   Informed Consent: I have reviewed the patients History and Physical, chart, labs and discussed the procedure including the risks, benefits and alternatives for the proposed anesthesia with the patient or authorized representative who has indicated his/her understanding and acceptance.     Dental advisory given  Plan Discussed  with: CRNA and Surgeon  Anesthesia Plan Comments:       Anesthesia Quick Evaluation

## 2021-08-26 ENCOUNTER — Encounter (HOSPITAL_COMMUNITY)
Admission: RE | Disposition: A | Discharge status: Home / Self Care | Payer: Self-pay | Source: Home / Self Care | Attending: Vascular Surgery

## 2021-08-26 ENCOUNTER — Ambulatory Visit (HOSPITAL_COMMUNITY): Payer: Medicare Other | Admitting: Anesthesiology

## 2021-08-26 ENCOUNTER — Encounter (HOSPITAL_COMMUNITY): Payer: Self-pay | Admitting: Vascular Surgery

## 2021-08-26 ENCOUNTER — Other Ambulatory Visit: Payer: Self-pay

## 2021-08-26 ENCOUNTER — Ambulatory Visit (HOSPITAL_COMMUNITY)
Admission: RE | Admit: 2021-08-26 | Discharge: 2021-08-26 | Disposition: A | Discharge status: Home / Self Care | Payer: Medicare Other | Attending: Vascular Surgery | Admitting: Vascular Surgery

## 2021-08-26 ENCOUNTER — Ambulatory Visit (HOSPITAL_BASED_OUTPATIENT_CLINIC_OR_DEPARTMENT_OTHER): Payer: Medicare Other | Admitting: Anesthesiology

## 2021-08-26 DIAGNOSIS — I509 Heart failure, unspecified: Secondary | ICD-10-CM

## 2021-08-26 DIAGNOSIS — I739 Peripheral vascular disease, unspecified: Secondary | ICD-10-CM | POA: Diagnosis not present

## 2021-08-26 DIAGNOSIS — J45909 Unspecified asthma, uncomplicated: Secondary | ICD-10-CM | POA: Insufficient documentation

## 2021-08-26 DIAGNOSIS — T82510A Breakdown (mechanical) of surgically created arteriovenous fistula, initial encounter: Secondary | ICD-10-CM | POA: Diagnosis not present

## 2021-08-26 DIAGNOSIS — Y832 Surgical operation with anastomosis, bypass or graft as the cause of abnormal reaction of the patient, or of later complication, without mention of misadventure at the time of the procedure: Secondary | ICD-10-CM | POA: Insufficient documentation

## 2021-08-26 DIAGNOSIS — I132 Hypertensive heart and chronic kidney disease with heart failure and with stage 5 chronic kidney disease, or end stage renal disease: Secondary | ICD-10-CM

## 2021-08-26 DIAGNOSIS — N186 End stage renal disease: Secondary | ICD-10-CM

## 2021-08-26 DIAGNOSIS — Z6841 Body Mass Index (BMI) 40.0 and over, adult: Secondary | ICD-10-CM | POA: Diagnosis not present

## 2021-08-26 DIAGNOSIS — I120 Hypertensive chronic kidney disease with stage 5 chronic kidney disease or end stage renal disease: Secondary | ICD-10-CM | POA: Insufficient documentation

## 2021-08-26 DIAGNOSIS — M1990 Unspecified osteoarthritis, unspecified site: Secondary | ICD-10-CM | POA: Diagnosis not present

## 2021-08-26 DIAGNOSIS — Z992 Dependence on renal dialysis: Secondary | ICD-10-CM

## 2021-08-26 HISTORY — PX: AV FISTULA PLACEMENT: SHX1204

## 2021-08-26 LAB — POCT I-STAT, CHEM 8
BUN: 67 mg/dL — ABNORMAL HIGH (ref 6–20)
Calcium, Ion: 1.11 mmol/L — ABNORMAL LOW (ref 1.15–1.40)
Chloride: 103 mmol/L (ref 98–111)
Creatinine, Ser: >18 mg/dL — ABNORMAL HIGH (ref 0.44–1.00)
Glucose, Bld: 87 mg/dL (ref 70–99)
HCT: 31 % — ABNORMAL LOW (ref 36.0–46.0)
Hemoglobin: 10.5 g/dL — ABNORMAL LOW (ref 12.0–15.0)
Potassium: 3.5 mmol/L (ref 3.5–5.1)
Sodium: 141 mmol/L (ref 135–145)
TCO2: 22 mmol/L (ref 22–32)

## 2021-08-26 SURGERY — INSERTION OF ARTERIOVENOUS (AV) GORE-TEX GRAFT ARM
Anesthesia: Regional | Site: Arm Upper | Laterality: Left

## 2021-08-26 MED ORDER — MIDAZOLAM HCL 2 MG/2ML IJ SOLN
INTRAMUSCULAR | Status: AC
Start: 2021-08-26 — End: ?
  Filled 2021-08-26: qty 2

## 2021-08-26 MED ORDER — HEPARIN 6000 UNIT IRRIGATION SOLUTION
Status: AC
Start: 2021-08-26 — End: ?
  Filled 2021-08-26: qty 500

## 2021-08-26 MED ORDER — PHENYLEPHRINE HCL-NACL 20-0.9 MG/250ML-% IV SOLN
INTRAVENOUS | Status: DC | PRN
  Administered 2021-08-26: 20 ug/min via INTRAVENOUS

## 2021-08-26 MED ORDER — MEPERIDINE HCL 25 MG/ML IJ SOLN: 6.2500 mg | INTRAMUSCULAR | Status: DC | PRN

## 2021-08-26 MED ORDER — LIDOCAINE HCL (CARDIAC) PF 100 MG/5ML IV SOSY
PREFILLED_SYRINGE | INTRAVENOUS | Status: DC | PRN
  Administered 2021-08-26: 100 mg via INTRATRACHEAL

## 2021-08-26 MED ORDER — OXYCODONE HCL 5 MG PO TABS: 5.0000 mg | ORAL_TABLET | Freq: Once | ORAL | Status: DC | PRN

## 2021-08-26 MED ORDER — LIDOCAINE 2% (20 MG/ML) 5 ML SYRINGE
INTRAMUSCULAR | Status: AC
  Filled 2021-08-26: qty 5

## 2021-08-26 MED ORDER — ONDANSETRON HCL 4 MG/2ML IJ SOLN
INTRAMUSCULAR | Status: DC | PRN
  Administered 2021-08-26: 4 mg via INTRAVENOUS

## 2021-08-26 MED ORDER — FENTANYL CITRATE (PF) 100 MCG/2ML IJ SOLN: 25.0000 ug | INTRAMUSCULAR | Status: DC | PRN

## 2021-08-26 MED ORDER — MIDAZOLAM HCL 2 MG/2ML IJ SOLN
INTRAMUSCULAR | Status: DC | PRN
  Administered 2021-08-26 (×2): 1 mg via INTRAVENOUS

## 2021-08-26 MED ORDER — PHENYLEPHRINE HCL (PRESSORS) 10 MG/ML IV SOLN: INTRAVENOUS | Status: DC | PRN

## 2021-08-26 MED ORDER — OXYCODONE-ACETAMINOPHEN 5-325 MG PO TABS: 1.0000 | ORAL_TABLET | ORAL | 0 refills | Status: DC | PRN

## 2021-08-26 MED ORDER — PROTAMINE SULFATE 10 MG/ML IV SOLN
INTRAVENOUS | Status: DC | PRN
  Administered 2021-08-26: 20 mg via INTRAVENOUS

## 2021-08-26 MED ORDER — ACETAMINOPHEN 500 MG PO TABS
1000.0000 mg | ORAL_TABLET | Freq: Once | ORAL | Status: AC
  Administered 2021-08-26: 1000 mg via ORAL
  Filled 2021-08-26: qty 2

## 2021-08-26 MED ORDER — PROPOFOL 500 MG/50ML IV EMUL
INTRAVENOUS | Status: DC | PRN
  Administered 2021-08-26: 75 ug/kg/min via INTRAVENOUS

## 2021-08-26 MED ORDER — PROMETHAZINE HCL 25 MG/ML IJ SOLN: 6.2500 mg | INTRAMUSCULAR | Status: DC | PRN

## 2021-08-26 MED ORDER — OXYCODONE HCL 5 MG/5ML PO SOLN: 5.0000 mg | Freq: Once | ORAL | Status: DC | PRN

## 2021-08-26 MED ORDER — ONDANSETRON HCL 4 MG/2ML IJ SOLN
INTRAMUSCULAR | Status: AC
  Filled 2021-08-26: qty 2

## 2021-08-26 MED ORDER — HEPARIN SODIUM (PORCINE) 1000 UNIT/ML IJ SOLN
INTRAMUSCULAR | Status: DC | PRN
  Administered 2021-08-26: 5000 [IU] via INTRAVENOUS

## 2021-08-26 MED ORDER — HEPARIN 6000 UNIT IRRIGATION SOLUTION
Status: DC | PRN
  Administered 2021-08-26: 1

## 2021-08-26 MED ORDER — MIDAZOLAM HCL 2 MG/2ML IJ SOLN: 0.5000 mg | Freq: Once | INTRAMUSCULAR | Status: DC | PRN

## 2021-08-26 MED ORDER — CHLORHEXIDINE GLUCONATE 0.12 % MT SOLN
OROMUCOSAL | Status: AC
  Administered 2021-08-26: 15 mL
  Filled 2021-08-26: qty 15

## 2021-08-26 MED ORDER — FENTANYL CITRATE (PF) 250 MCG/5ML IJ SOLN
INTRAMUSCULAR | Status: AC
  Filled 2021-08-26: qty 5

## 2021-08-26 MED ORDER — CEFAZOLIN IN SODIUM CHLORIDE 3-0.9 GM/100ML-% IV SOLN
3.0000 g | INTRAVENOUS | Status: AC
  Administered 2021-08-26: 3 g via INTRAVENOUS
  Filled 2021-08-26: qty 100

## 2021-08-26 MED ORDER — LIDOCAINE-EPINEPHRINE 1 %-1:200000 IJ SOLN
INTRAMUSCULAR | Status: AC
Start: 2021-08-26 — End: ?
  Filled 2021-08-26: qty 30

## 2021-08-26 MED ORDER — FENTANYL CITRATE (PF) 250 MCG/5ML IJ SOLN
INTRAMUSCULAR | Status: DC | PRN
  Administered 2021-08-26 (×5): 50 ug via INTRAVENOUS

## 2021-08-26 MED ORDER — LIDOCAINE-EPINEPHRINE (PF) 1 %-1:200000 IJ SOLN
INTRAMUSCULAR | Status: DC | PRN
  Administered 2021-08-26: 19 mL

## 2021-08-26 MED ORDER — CHLORHEXIDINE GLUCONATE 4 % EX LIQD: 60.0000 mL | Freq: Once | CUTANEOUS | Status: DC

## 2021-08-26 MED ORDER — LIDOCAINE HCL (PF) 1 % IJ SOLN
INTRAMUSCULAR | Status: AC
  Filled 2021-08-26: qty 30

## 2021-08-26 MED ORDER — 0.9 % SODIUM CHLORIDE (POUR BTL) OPTIME
TOPICAL | Status: DC | PRN
  Administered 2021-08-26: 1000 mL

## 2021-08-26 MED ORDER — SODIUM CHLORIDE 0.9 % IV SOLN: INTRAVENOUS | Status: DC

## 2021-08-26 SURGICAL SUPPLY — 55 items
ADH SKN CLS APL DERMABOND .7 (GAUZE/BANDAGES/DRESSINGS) ×1
AGENT HMST SPONGE THK3/8 (HEMOSTASIS)
ARMBAND PINK RESTRICT EXTREMIT (MISCELLANEOUS) ×4 IMPLANT
BAG COUNTER SPONGE SURGICOUNT (BAG) ×2 IMPLANT
BAG SPNG CNTER NS LX DISP (BAG)
BANDAGE ACE 4 STERILE IMPLANT
BANDAGE ESMARK 6X9 LF (GAUZE/BANDAGES/DRESSINGS) IMPLANT
BLADE CLIPPER SURG (BLADE) ×2 IMPLANT
BNDG CMPR 9X6 STRL LF SNTH (GAUZE/BANDAGES/DRESSINGS) ×1
BNDG ESMARK 6X9 LF (GAUZE/BANDAGES/DRESSINGS) ×1
BNDG GAUZE 4.5X4.1 6PLY STRL (MISCELLANEOUS) IMPLANT
CANISTER SUCT 3000ML PPV (MISCELLANEOUS) ×2 IMPLANT
CLIP VESOCCLUDE MED 6/CT (CLIP) ×2 IMPLANT
CLIP VESOCCLUDE SM WIDE 6/CT (CLIP) ×2 IMPLANT
COVER PROBE W GEL 5X96 (DRAPES) ×2 IMPLANT
CUFF TOURN SGL QUICK 24 (TOURNIQUET CUFF) ×1
CUFF TRNQT CYL 24X4X40X1 (TOURNIQUET CUFF) IMPLANT
DERMABOND ADVANCED (GAUZE/BANDAGES/DRESSINGS) ×1
DERMABOND ADVANCED .7 DNX12 (GAUZE/BANDAGES/DRESSINGS) ×2 IMPLANT
DRAIN JP 7F FLT 3/4 PRF SI HBL (DRAIN) IMPLANT
ELECT REM PT RETURN 9FT ADLT (ELECTROSURGICAL) ×1
ELECTRODE REM PT RTRN 9FT ADLT (ELECTROSURGICAL) ×2 IMPLANT
GLOVE BIO SURGEON STRL SZ7.5 (GLOVE) ×2 IMPLANT
GLOVE BIOGEL PI IND STRL 8 (GLOVE) ×2 IMPLANT
GLOVE BIOGEL PI INDICATOR 8 (GLOVE) ×1
GOWN STRL REUS W/ TWL LRG LVL3 (GOWN DISPOSABLE) ×4 IMPLANT
GOWN STRL REUS W/ TWL XL LVL3 (GOWN DISPOSABLE) ×4 IMPLANT
GOWN STRL REUS W/TWL LRG LVL3 (GOWN DISPOSABLE) ×2
GOWN STRL REUS W/TWL XL LVL3 (GOWN DISPOSABLE) ×2
HEMOSTAT SPONGE AVITENE ULTRA (HEMOSTASIS) IMPLANT
KIT BASIN OR (CUSTOM PROCEDURE TRAY) ×2 IMPLANT
KIT TURNOVER KIT B (KITS) ×2 IMPLANT
NS IRRIG 1000ML POUR BTL (IV SOLUTION) ×2 IMPLANT
PACK CV ACCESS (CUSTOM PROCEDURE TRAY) ×2 IMPLANT
PAD ARMBOARD 7.5X6 YLW CONV (MISCELLANEOUS) ×4 IMPLANT
PADDING CAST COTTON 6X4 STRL (CAST SUPPLIES) IMPLANT
SLING ARM FOAM STRAP LRG (SOFTGOODS) IMPLANT
SLING ARM FOAM STRAP MED (SOFTGOODS) IMPLANT
SPIKE FLUID TRANSFER (MISCELLANEOUS) ×2 IMPLANT
SPONGE GAUZE 4X4 12PLY (GAUZE/BANDAGES/DRESSINGS) IMPLANT
SUT ETHILON 3 0 PS 1 (SUTURE) IMPLANT
SUT GORETEX 6.0 TT13 (SUTURE) IMPLANT
SUT MNCRL AB 4-0 PS2 18 (SUTURE) ×2 IMPLANT
SUT PROLENE 5 0 C 1 24 (SUTURE) IMPLANT
SUT PROLENE 6 0 BV (SUTURE) ×2 IMPLANT
SUT PROLENE 7 0 BV 1 (SUTURE) IMPLANT
SUT SILK 0 TIES 10X30 (SUTURE) IMPLANT
SUT SILK 2 0 PERMA HAND 18 BK (SUTURE) IMPLANT
SUT VIC AB 3-0 SH 18 (SUTURE) IMPLANT
SUT VIC AB 3-0 SH 27 (SUTURE) ×2
SUT VIC AB 3-0 SH 27X BRD (SUTURE) ×4 IMPLANT
SYR BULB IRRIG 60ML STRL (SYRINGE) IMPLANT
TOWEL GREEN STERILE (TOWEL DISPOSABLE) ×2 IMPLANT
UNDERPAD 30X36 HEAVY ABSORB (UNDERPADS AND DIAPERS) ×2 IMPLANT
WATER STERILE IRR 1000ML POUR (IV SOLUTION) ×2 IMPLANT

## 2021-08-26 NOTE — Transfer of Care (Signed)
Immediate Anesthesia Transfer of Care Note  Patient: [name]  Procedure(s) Performed: LEFT ARM LIGATION OF BRACHIOBASILIC FISTULA,  REPAIR PSEUDOANEURYSM (Left: Arm Upper)  Patient Location: PACU  Anesthesia Type:MAC  Level of Consciousness: awake, alert  and oriented  Airway & Oxygen Therapy: Patient Spontanous Breathing  Post-op Assessment: Report given to RN and Post -op Vital signs reviewed and stable  Post vital signs: Reviewed and stable  Last Vitals:  Vitals Value Taken Time  BP 139/87 08/26/21 0927  Temp    Pulse 84 08/26/21 0929  Resp 18 08/26/21 0929  SpO2 94 % 08/26/21 0929  Vitals shown include unvalidated device data.  Last Pain:  Vitals:   08/26/21 0628  TempSrc: Oral         Complications: No notable events documented.

## 2021-08-26 NOTE — Progress Notes (Signed)
A consult was placed to IV Therapy to disconnect IVF from red port of [place];  red port flushed w 1m NS, followed by 1.939mHeparin; dead-ender caps intact; both ports clamped and taped;

## 2021-08-26 NOTE — Discharge Instructions (Addendum)
   [facility]  Discharge Instructions  Please refer to the following instructions for your post-procedure care. Your surgeon or physician assistant will discuss any changes with you.  Activity  You may drive the day following your surgery, if you are comfortable and no longer taking prescription pain medication. Resume full activity as the soreness in your incision resolves.  Bathing/Showering  You may shower after you go home. Keep your incision dry for 48 hours. Do not soak in a bathtub, hot tub, or swim until the incision heals completely. You may not shower if you have a hemodialysis catheter.  Incision Care  Clean your incision with mild soap and water after 48 hours. Pat the area dry with a clean towel. You do not need a bandage unless otherwise instructed. Do not apply any ointments or creams to your incision. You may have skin glue on your incision. Do not peel it off. It will come off on its own in about one week. Your arm may swell a bit after surgery. To reduce swelling use pillows to elevate your arm so it is above your heart. Your doctor will tell you if you need to lightly wrap your arm with an ACE bandage.  Diet  Resume your normal diet. There are not special food restrictions following this procedure. In order to heal from your surgery, it is CRITICAL to get adequate nutrition. Your body requires vitamins, minerals, and protein. Vegetables are the best source of vitamins and minerals. Vegetables also provide the perfect balance of protein. Processed food has little nutritional value, so try to avoid this.  Medications  Resume taking all of your medications. If your incision is causing pain, you may take over-the counter pain relievers such as acetaminophen (Tylenol). If you were prescribed a stronger pain medication, please be aware these medications can cause nausea and constipation. Prevent nausea by taking the medication with a snack or  meal. Avoid constipation by drinking plenty of fluids and eating foods with high amount of fiber, such as fruits, vegetables, and grains.  Do not take Tylenol if you are taking prescription pain medications.  Follow up Your surgeon may want to see you in the office following your access surgery. If so, this will be arranged at the time of your surgery.  Please call us immediately for any of the following conditions:  Increased pain, redness, drainage (pus) from your incision site Fever of 101 degrees or higher Severe or worsening pain at your incision site Hand pain or numbness.  Reduce your risk of vascular disease:  Stop smoking. If you would like help, call [facility] at 1-800-QUIT-NOW 564-298-4532) or [place] at [place] your cholesterol Maintain a desired weight Control your diabetes Keep your blood pressure down  Dialysis  It will take several weeks to several months for your new dialysis access to be ready for use. Your surgeon will determine when it is okay to use it. Your nephrologist will continue to direct your dialysis. You can continue to use your Permcath until your new access is ready for use.   08/26/2021 [name] [id number] Oct 03, 1975  Surgeon(s): Marty Heck, MD  Procedure(s): LEFT ARM LIGATION OF BRACHIOBASILIC FISTULA,  REPAIR PSEUDOANEURYSM   If you have any questions, please call the office at [phone].

## 2021-08-26 NOTE — H&P (Signed)
History and Physical Interval Note:  08/26/2021 7:32 AM  [name]  has presented today for surgery, with the diagnosis of ESRD.  The various methods of treatment have been discussed with the patient and family. After consideration of risks, benefits and other options for treatment, the patient has consented to  Procedure(s): INSERTION OF LEFT ARM ARTERIOVENOUS (AV) GORE-TEX GRAFT (Left) as a surgical intervention.  The patient's history has been reviewed, patient examined, no change in status, stable for surgery.  I have reviewed the patient's chart and labs.  Questions were answered to the patient's satisfaction.     Plan was new left arm AVG today.  Left arm basilic vein not usable.  Now has ulcer with cellulitis over left arm AVF.  Discussed will ligate left arm fistula given bleeding risk.  Delay placement of new left arm AVG given concern for left arm infection.     Marty Heck  CC:  follow up Requesting Provider:  [place] Chard, MD   HPI: [name] is a 46 y.o. (1975/02/13) female who walked into clinic for urgent evaluation of left arm bruising.  She is status post left second stage basilic vein fistula creation by Dr. Carlis Abbott on 04/13/2021.  She has been dialyzing from this fistula over the past couple weeks.  She woke up this morning with excessive bruising and swelling in the area of cannulation of left arm fistula.  Patient still has a right IJ TDC.         Past Medical History:  Diagnosis Date   Anemia     Anxiety      pt not aware of this   Arthritis     Breast mass 09/01/2016    Targeted ultrasound is performed, showing a mass in the left breast at 11 o'clock, 1 cm from the nipple measuring 6.4 by 5.0 cm. The mass is heterogeneous and may contain macroscopic fat.   IMPRESSION: The mass in the left breast is probably benign, possibly a Hamartoma.  RECOMMENDATION: Recommend six-month follow-up mammogram of the probably benign left breast mass. If  necessary, an ultrasound co   CHF (congestive heart failure) ([place])      hosp. 2011   Chronic kidney disease      Mon Wed Friday   Dilated cardiomyopathy ([place])     Encounter for blood transfusion     ESRD (end stage renal disease) ([place])      MWF - [place]   Family history of adverse reaction to anesthesia       " Aunt has a tracheostomy and kept coughing after colonoscopy "   Foot fracture     Gout      no flare up for several yrs   Headache     Heart murmur      as a child, no problems as an adult   Hemodialysis patient ([place])     History of blood transfusion      x 2   History of pneumonia     Hyperlipidemia     Hypertension     Menorrhagia     Morbid obesity ([place])     Pneumonia      hosp. 2010   Shortness of breath      HX CHL - occasional. 04/09/21 patient sits down and shortness of breath susides.   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   Thyroid disease             Past Surgical History:  Procedure Laterality Date   ANGIOPLASTY   07/28/2015    Procedure: ANGIOPLASTY OF RIGHT BRACHIAL VEIN;  Surgeon: Conrad Eight[place] Four, MD;  Location: [place];  Service: Vascular;;   AV FISTULA PLACEMENT   08/04/2011    Procedure: ARTERIOVENOUS (AV) FISTULA CREATION;  Surgeon: Elam Dutch, MD;  Location: [place];  Service: Vascular;  Laterality: Right;  Creation right brachiocephalic arteriovenous fistula   AV FISTULA PLACEMENT Left 02/05/2013    Procedure: ARTERIOVENOUS FISTULA CREATION LEFT ARM;  Surgeon: Conrad McDermott, MD;  Location: [place];  Service: Vascular;  Laterality: Left;   AV FISTULA PLACEMENT Right 06/04/2013    Procedure: ARTERIOVENOUS (AV) FISTULA CREATION;  Surgeon: Conrad Litchfield, MD;  Location: [place];  Service: Vascular;  Laterality: Right;   AV FISTULA PLACEMENT Right 04/15/2014    Procedure: INSERTION OF ARTERIOVENOUS (AV) GORE-TEX GRAFT  ARM;  Surgeon: Conrad Montecito, MD;  Location: [place];  Service: Vascular;  Laterality: Right;   AV FISTULA PLACEMENT Left 02/25/2021    Procedure: LEFT ARM ARTERIOVENOUS (AV) FISTULA CREATION;  Surgeon: Marty Heck, MD;  Location: [place];  Service: Vascular;  Laterality: Left;   [place] Right 08/13/2013    Procedure: Second Stage Brachial Vein Transposition;  Surgeon: Conrad Shubert, MD;  Location: [place];  Service: Vascular;  Laterality: Right;   [place] Left 04/13/2021    Procedure: LEFT ARM SECOND STAGE BASILIC VEIN TRANSPOSITION;  Surgeon: Marty Heck, MD;  Location: [place];  Service: Vascular;  Laterality: Left;   CARDIAC SURGERY   1979    repair of hole in heart   INCISION AND DRAINAGE OF WOUND Right 07/28/2015    Procedure: IRRIGATION AND DEBRIDEMENT RIGHT HAND  WOUND;  Surgeon: Dayna Barker, MD;  Location: [place];  Service: Plastics;  Laterality: Right;   LIGATION OF ARTERIOVENOUS  FISTULA Left 03/05/2013    Procedure: LIGATION OF ARTERIOVENOUS  FISTULA;  Surgeon: Conrad Calvert, MD;  Location: [place];  Service: Vascular;  Laterality: Left;   PATCH ANGIOPLASTY Left 03/05/2013    Procedure: PATCH ANGIOPLASTY;  Surgeon: Conrad [place], MD;  Location: [place];  Service: Vascular;  Laterality: Left;   PERIPHERAL VASCULAR CATHETERIZATION Right 10/31/2014    Procedure: Fistulagram;  Surgeon: Conrad Toulon, MD;  Location: [place] [facility];  Service: Cardiovascular;  Laterality: Right;   PERIPHERAL VASCULAR CATHETERIZATION Right 10/31/2014    Procedure: Peripheral Vascular Balloon Angioplasty;  Surgeon: Conrad Ewing, MD;  Location: [place] [facility];  Service: Cardiovascular;  Laterality: Right;  pta venous rt arm   PERIPHERAL VASCULAR CATHETERIZATION N/A 03/13/2015    Procedure: A/V Shuntogram;  Surgeon: Conrad Gillett, MD;  Location: [place] [facility];  Service: Cardiovascular;  Laterality: N/A;   PERIPHERAL VASCULAR CATHETERIZATION Right 03/13/2015     Procedure: Peripheral Vascular Balloon Angioplasty;  Surgeon: Conrad Bucyrus, MD;  Location: [place] [facility];  Service: Cardiovascular;  Laterality: Right;  Shunt   PERIPHERAL VASCULAR CATHETERIZATION Right 01/19/2016    Procedure: A/V Shuntogram;  Surgeon: Angelia Mould, MD;  Location: [place] [facility];  Service: Cardiovascular;  Laterality: Right;   REVISION OF ARTERIOVENOUS GORETEX GRAFT Right 10/27/2015    Procedure: THROMBECTOMY AND REVISION OF RIGHT UPPER ARM  ARTERIOVENOUS GORETEX GRAFT USING 6MM X 10CM GORE-TEX GRAFT;  Surgeon: Elam Dutch, MD;  Location: [place];  Service: Vascular;  Laterality: Right;   REVISION OF ARTERIOVENOUS GORETEX GRAFT Right 01/30/2016    Procedure: REVISION OF RIGHT UPPER ARM ARTERIOVENOUS USING GORETEX GRAFT;  Surgeon: Angelia Mould, MD;  Location: [place];  Service: Vascular;  Laterality: Right;   THROMBECTOMY W/ EMBOLECTOMY Left 03/05/2013    Procedure: THROMBECTOMY OF LEFT BRACHIAL ARTERY;  Surgeon: Conrad [place], MD;  Location: [place];  Service: Vascular;  Laterality: Left;   VENOGRAM Bilateral 03/25/2014    Procedure: VENOGRAM bilateral;  Surgeon: Conrad [place], MD;  Location: [facility];  Service: Cardiovascular;  Laterality: Bilateral;      Social History         Socioeconomic History   Marital status: Married      Spouse name: Not on file   Number of children: Not on file   Years of education: Not on file   Highest education level: Not on file  Occupational History   Not on file  Tobacco Use   Smoking status: Never   Smokeless tobacco: Never  Vaping Use   Vaping Use: Never used  Substance and Sexual Activity   Alcohol use: No      Alcohol/week: 0.0 standard drinks of alcohol   Drug use: No   Sexual activity: Not Currently      Comment: no birth control   Other Topics Concern   Not on file  Social History Narrative   Not on file    Social Determinants of Health        Financial Resource Strain: Not on file  Food  Insecurity: No Food Insecurity (09/18/2019)    Hunger Vital Sign     Worried About Running Out of Food in the Last Year: Never true     Ran Out of Food in the Last Year: Never true  Transportation Needs: Unmet Transportation Needs (09/18/2019)    PRAPARE - Armed forces logistics/support/administrative officer (Medical): Yes     Lack of Transportation (Non-Medical): No  Physical Activity: Not on file  Stress: Not on file  Social Connections: Not on file  Intimate Partner Violence: Not on file           Family History  Problem Relation Age of Onset   Diabetes Mother     Hyperlipidemia Mother     Hypertension Mother     Kidney disease Mother     Anesthesia problems Neg Hx     Hypotension Neg Hx     Malignant hyperthermia Neg Hx     Pseudochol deficiency Neg Hx              Current Outpatient Medications  Medication Sig Dispense Refill   Aspirin-Caffeine (BAYER BACK & BODY) 500-32.5 MG TABS Take 2 tablets by mouth 2 (two) times daily as needed (pain.).       AURYXIA 1 GM 210 MG(Fe) tablet Take 630 mg by mouth See admin instructions. Take 3 tablets (630 mg) by mouth with meals and snacks       cinacalcet (SENSIPAR) 60 MG tablet Take 60 mg by mouth in the morning.       cyclobenzaprine (FLEXERIL) 5 MG tablet Take 1 tablet (5 mg total) by mouth at bedtime. 10 tablet 0   HYDROcodone-acetaminophen (NORCO/VICODIN) 5-325 MG tablet Take 1 tablet by mouth every 6 (six) hours as needed for moderate pain. 20 tablet 0   megestrol (MEGACE) 20 MG tablet Take 2  tablets (40 mg total) by mouth daily. 60 tablet 11   predniSONE (DELTASONE) 20 MG tablet Take 2 tablets (40 mg total) by mouth daily. 10 tablet 0   PROAIR HFA 108 (90 Base) MCG/ACT inhaler Inhale 1-2 puffs into the lungs 3 (three) times daily as needed for wheezing or shortness of breath. For wheezing.   2    No current facility-administered medications for this visit.           Allergies  Allergen Reactions   Pollen Extract Other (See Comments)       Unknown   Oxycodone-Acetaminophen Itching      Tolerates with benadryl         REVIEW OF SYSTEMS:    '[X]'$  denotes positive finding, '[ ]'$  denotes negative finding Cardiac   Comments:  Chest pain or chest pressure:      Shortness of breath upon exertion:      Short of breath when lying flat:      Irregular heart rhythm:             Vascular      Pain in calf, thigh, or hip brought on by ambulation:      Pain in feet at night that wakes you up from your sleep:       Blood clot in your veins:      Leg swelling:              Pulmonary      Oxygen at home:      Productive cough:       Wheezing:              Neurologic      Sudden weakness in arms or legs:       Sudden numbness in arms or legs:       Sudden onset of difficulty speaking or slurred speech:      Temporary loss of vision in one eye:       Problems with dizziness:              Gastrointestinal      Blood in stool:       Vomited blood:              Genitourinary      Burning when urinating:       Blood in urine:             Psychiatric      Major depression:              Hematologic      Bleeding problems:      Problems with blood clotting too easily:             Skin      Rashes or ulcers:             Constitutional      Fever or chills:          PHYSICAL EXAMINATION:   There were no vitals filed for this visit.   General:  WDWN in NAD; vital signs documented above Gait: Not observed HENT: WNL, normocephalic Pulmonary: normal non-labored breathing , without Rales, rhonchi,  wheezing Cardiac: regular HR Abdomen: soft, NT, no masses Skin: without rashes Vascular Exam/Pulses: Pulsatile flow near the anastomosis in the left arm up to the area of hematoma and ecchymosis; soft monophasic flow by Doppler beyond the area of hematoma Musculoskeletal: no muscle wasting or atrophy       Neurologic:  A&O X 3;  No focal weakness or paresthesias are detected Psychiatric:  The pt has Normal affect.        ASSESSMENT/PLAN:: 46 y.o. female who walked into clinic for urgent evaluation of left arm AV fistula     -This patient is 3 months status post second stage basilic vein transposition of left arm.  She has a small hematoma after experiencing an infiltration event at hemodialysis treatment yesterday.  I believe this can be treated conservatively by resting the fistula and I am not concerned about skin overlying the hematoma.  However I am concerned about the quality of Doppler signal in the fistula beyond the hematoma.  This may result in thrombosis of her left arm AV fistula.  As mentioned above we will rest her fistula and continue HD via right IJ TDC for the next 2 to 3 weeks.  She will then return to office with fistula duplex to see if fistula has thrombosed or if it are still remains patent.  She is aware she may require conversion to a left arm AV graft.     Dagoberto Ligas, PA-C [facility] [phone]   Clinic MD:   Scot Dock

## 2021-08-26 NOTE — Op Note (Signed)
Date: August 26, 2021  Preoperative diagnosis: Ulcerated left brachiobasilic arteriovenous fistula with large pseudoaneurysm  Postoperative diagnosis: Same  Procedure: 1.  Ligation of left brachiobasilic arteriovenous fistula 2.  Repair and excision of left brachiobasilic pseudoaneurysm adjacent to the arterial anastomosis 3.  Excision of necrotic skin over left arm AV fistula 4.  Placement of 7 French flat JP drain left arm wound  Surgeon: Dr. Marty Heck, MD  Assistant: OR staff  Indication: 46 year old female with end-stage renal disease that presents for left upper arm AV graft placement with a failing left brachiobasilic fistula.  Today she is noted to have ulcerated skin over the fistula with some concerning cellulitis.  I recommended fistula ligation.  She presents after risk benefits discussed.  We will delay placement of new AV graft.  Findings: There was a large pseudoaneurysm in the brachiobasilic fistula adjacent to the arterial anastomosis.  I placed a tourniquet on the upper arm and I was able to get control adjacent to the arterial anastomosis with a profunda clamp.  The pseudoaneurysm was completely excised.  I then ligated the brachiobasilic fistula adjacent to the arterial anastomosis with a 5-0 Prolene.  Palpable radial pulse at completion.  Placed a 7 French drain in the wound cavity.  Anesthesia: Local with MAC  Details: Patient was taken to the operating room after informed consent was obtained.  Placed on the operative table in supine position.  After anesthesia was induced timeout was performed.  Initially used sterile ultrasound to evaluate the left brachiobasilic fistula and this appeared to be a large pseudoaneurysm adjacent to the arterial anastomosis.  I then made a longitudinal incision over the necrotic ulcer dissected down with Bovie cautery.  Immediately got into the cavity and used digital control.  Patient was given 5,000 units of IV heparin.  Placed  a tourniquet on the upper arm and Esmarch was used to exsanguinate the arm.  I then dissected with Bovie cautery and was able to dissect out the pseudoaneurysm cavity and get down to the fistula adjacent to the artery and this was clamped with a profunda clamp.  Pseudoaneurysm cavity was then dissected out and excised.  I had enough healthy stump of the fistula adjacent to the artery that this was oversewn with a 5-0 Prolene with good hemostasis.  I placed a second 5-0 Prolene just to ensure we had adequate control.  I released the vascular clamp.  The more proximal extent of the fistula in the upper arm was also ligated with a 2-0 silk to prevent any backbleeding.  The cavity was copiously irrigated.  I put Surgicel powder in the wound.  7 [place] JP flat drain was tunneled and placed in the wound.  The necrotic overlying skin was excised.  The skin was closed with multiple layers of 3-0 Vicryl and 3-0 nylon in the skin.  Complication: None  Condition: Stable  Marty Heck, MD [facility] [place] Office: [place]

## 2021-08-27 ENCOUNTER — Encounter (HOSPITAL_COMMUNITY): Payer: Self-pay | Admitting: Vascular Surgery

## 2021-08-28 ENCOUNTER — Telehealth: Payer: Self-pay

## 2021-08-28 DIAGNOSIS — R52 Pain, unspecified: Secondary | ICD-10-CM | POA: Diagnosis not present

## 2021-08-28 DIAGNOSIS — L299 Pruritus, unspecified: Secondary | ICD-10-CM | POA: Diagnosis not present

## 2021-08-28 DIAGNOSIS — D509 Iron deficiency anemia, unspecified: Secondary | ICD-10-CM | POA: Diagnosis not present

## 2021-08-28 DIAGNOSIS — T8249XA Other complication of vascular dialysis catheter, initial encounter: Secondary | ICD-10-CM | POA: Diagnosis not present

## 2021-08-28 DIAGNOSIS — T8249XS Other complication of vascular dialysis catheter, sequela: Secondary | ICD-10-CM | POA: Diagnosis not present

## 2021-08-28 DIAGNOSIS — Z992 Dependence on renal dialysis: Secondary | ICD-10-CM | POA: Diagnosis not present

## 2021-08-28 DIAGNOSIS — N2581 Secondary hyperparathyroidism of renal origin: Secondary | ICD-10-CM | POA: Diagnosis not present

## 2021-08-28 DIAGNOSIS — N186 End stage renal disease: Secondary | ICD-10-CM | POA: Diagnosis not present

## 2021-08-28 DIAGNOSIS — D689 Coagulation defect, unspecified: Secondary | ICD-10-CM | POA: Diagnosis not present

## 2021-08-28 NOTE — Anesthesia Postprocedure Evaluation (Signed)
Anesthesia Post Note  Patient: [name]  Procedure(s) Performed: LEFT ARM LIGATION OF BRACHIOBASILIC FISTULA,  REPAIR PSEUDOANEURYSM (Left: Arm Upper)     Patient location during evaluation: PACU Anesthesia Type: MAC Level of consciousness: awake and alert Pain management: pain level controlled Vital Signs Assessment: post-procedure vital signs reviewed and stable Respiratory status: spontaneous breathing, nonlabored ventilation, respiratory function stable and patient connected to nasal cannula oxygen Cardiovascular status: stable and blood pressure returned to baseline Postop Assessment: no apparent nausea or vomiting Anesthetic complications: no   No notable events documented.  Last Vitals:  Vitals:   08/26/21 0945 08/26/21 1000  BP: (!) 148/91 (!) 160/92  Pulse: 78 72  Resp: 19 16  Temp:  36.6 C  SpO2: 97% 97%    Last Pain:  Vitals:   08/26/21 1000  TempSrc:   PainSc: 0-No pain                 Tiajuana Amass

## 2021-08-28 NOTE — Telephone Encounter (Signed)
Pt called asking if any antibiotics were prescribed to her post-op because she heard them mentioned. Informed her that she was given an IV dose prior to surgery. Pt was c/o pain. Instructed her to keep arm elevated, take OTC meds in between oxy doses, and wrap with ACE if swelling became an issue. Confirmed understanding.

## 2021-09-01 ENCOUNTER — Ambulatory Visit (INDEPENDENT_AMBULATORY_CARE_PROVIDER_SITE_OTHER): Payer: Medicare Other | Admitting: Physician Assistant

## 2021-09-01 VITALS — BP 150/95 | HR 80 | Temp 98.0°F | Ht 69.0 in | Wt 313.0 lb

## 2021-09-01 DIAGNOSIS — Z992 Dependence on renal dialysis: Secondary | ICD-10-CM

## 2021-09-01 DIAGNOSIS — N186 End stage renal disease: Secondary | ICD-10-CM

## 2021-09-01 NOTE — Progress Notes (Signed)
  POST OPERATIVE OFFICE NOTE    CC:  F/u for surgery  HPI:  This is a 46 y.o. female who is s/p infiltration event of left second stage brachiobasilic fistula.  After the hematoma resolved she underwent fistulogram due to narrowing noted on duplex.  Plan was to convert her to a AV graft however intraoperatively due to a sizable pseudoaneurysm near the arterial anastomosis, fistula was ligated and a drain was placed.  She follows up in office 6 days postop for drain removal.  She denies any signs or symptoms of ischemia of the left hand.  She states the drain has clogged up and has drainage from the exit site.  She is dialyzing via right IJ TDC.  Allergies  Allergen Reactions   Pollen Extract Itching    Unknown   Oxycodone-Acetaminophen Itching    Tolerates with benadryl     Current Outpatient Medications  Medication Sig Dispense Refill   Aspirin-Caffeine (BAYER BACK & BODY) 500-32.5 MG TABS Take 2 tablets by mouth 2 (two) times daily as needed (pain.).     AURYXIA 1 GM 210 MG(Fe) tablet Take 630 mg by mouth See admin instructions. Take 3 tablets (630 mg) by mouth with meals and snacks     cinacalcet (SENSIPAR) 60 MG tablet Take 60 mg by mouth in the morning.     megestrol (MEGACE) 20 MG tablet Take 2 tablets (40 mg total) by mouth daily. 60 tablet 11   PROAIR HFA 108 (90 Base) MCG/ACT inhaler Inhale 1-2 puffs into the lungs 3 (three) times daily as needed for wheezing or shortness of breath. For wheezing.  2   No current facility-administered medications for this visit.     ROS:  See HPI  Physical Exam:  Vitals:   09/01/21 0954  BP: (!) 150/95  Pulse: 80  Temp: 98 F (36.7 C)  TempSrc: Temporal  SpO2: 100%  Weight: (!) 313 lb (142 kg)  Height: '5\' 9"'$  (1.753 m)    Incision: Serous drainage from drain exit site; JP tubing occluded; some superficial sloughing at the incision in her AC fossa Extremities:   Palpable left radial pulse Neuro: A&O  Assessment/Plan:  This is a 46  y.o. female who is s/p: Ligation of left brachial basilic fistula with repair and excision of large pseudoaneurysm adjacent to the arterial anastomosis and placement of JP drain  -Patient was experiencing serous drainage around the tubing from the exit site of the JP drain; JP drain tubing was clogged.  JP was removed and all fluid was expressed.  JP exit site was then dressed with a dry dressing -We will continue the sutures at her [facility] fossa for another 2 weeks.  I asked her to wash this area with soap and water and to pat dry.  Although it is difficult being in the [facility] fossa crease she should make an effort to make sure this area is dry at all times -Once the patient heals her current wounds we can then discuss permanent access in the future which will likely be an AV graft. -Wound check with possible suture removal in 2 weeks   Dagoberto Ligas, PA-C [facility] [phone]  Clinic MD:  Stanford Breed

## 2021-09-02 DIAGNOSIS — D509 Iron deficiency anemia, unspecified: Secondary | ICD-10-CM | POA: Diagnosis not present

## 2021-09-02 DIAGNOSIS — T8249XS Other complication of vascular dialysis catheter, sequela: Secondary | ICD-10-CM | POA: Diagnosis not present

## 2021-09-02 DIAGNOSIS — R52 Pain, unspecified: Secondary | ICD-10-CM | POA: Diagnosis not present

## 2021-09-02 DIAGNOSIS — T8249XA Other complication of vascular dialysis catheter, initial encounter: Secondary | ICD-10-CM | POA: Diagnosis not present

## 2021-09-02 DIAGNOSIS — L299 Pruritus, unspecified: Secondary | ICD-10-CM | POA: Diagnosis not present

## 2021-09-02 DIAGNOSIS — N2581 Secondary hyperparathyroidism of renal origin: Secondary | ICD-10-CM | POA: Diagnosis not present

## 2021-09-02 DIAGNOSIS — Z992 Dependence on renal dialysis: Secondary | ICD-10-CM | POA: Diagnosis not present

## 2021-09-02 DIAGNOSIS — D689 Coagulation defect, unspecified: Secondary | ICD-10-CM | POA: Diagnosis not present

## 2021-09-02 DIAGNOSIS — N186 End stage renal disease: Secondary | ICD-10-CM | POA: Diagnosis not present

## 2021-09-03 DIAGNOSIS — N186 End stage renal disease: Secondary | ICD-10-CM | POA: Diagnosis not present

## 2021-09-03 DIAGNOSIS — Z992 Dependence on renal dialysis: Secondary | ICD-10-CM | POA: Diagnosis not present

## 2021-09-03 DIAGNOSIS — N041 Nephrotic syndrome with focal and segmental glomerular lesions: Secondary | ICD-10-CM | POA: Diagnosis not present

## 2021-09-07 DIAGNOSIS — D689 Coagulation defect, unspecified: Secondary | ICD-10-CM | POA: Diagnosis not present

## 2021-09-07 DIAGNOSIS — L299 Pruritus, unspecified: Secondary | ICD-10-CM | POA: Diagnosis not present

## 2021-09-07 DIAGNOSIS — T8249XS Other complication of vascular dialysis catheter, sequela: Secondary | ICD-10-CM | POA: Diagnosis not present

## 2021-09-07 DIAGNOSIS — Z992 Dependence on renal dialysis: Secondary | ICD-10-CM | POA: Diagnosis not present

## 2021-09-07 DIAGNOSIS — N186 End stage renal disease: Secondary | ICD-10-CM | POA: Diagnosis not present

## 2021-09-07 DIAGNOSIS — T8249XA Other complication of vascular dialysis catheter, initial encounter: Secondary | ICD-10-CM | POA: Diagnosis not present

## 2021-09-07 DIAGNOSIS — N2581 Secondary hyperparathyroidism of renal origin: Secondary | ICD-10-CM | POA: Diagnosis not present

## 2021-09-10 ENCOUNTER — Telehealth: Payer: Self-pay

## 2021-09-10 NOTE — Telephone Encounter (Signed)
Pt in office requesting a letter to start back to work on Saturday, 09/12/21. 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 Reviewed pt's chart, spoke to [place], [place], who advised that pt could return to work. Letter written, printed, signed, and given to pt. Pt stated that she will let her work know if she begins experiencing any issues and stop work immediately. Confirmed understanding. Pt next appt on 09/15/21.

## 2021-09-11 DIAGNOSIS — Z992 Dependence on renal dialysis: Secondary | ICD-10-CM | POA: Diagnosis not present

## 2021-09-11 DIAGNOSIS — D689 Coagulation defect, unspecified: Secondary | ICD-10-CM | POA: Diagnosis not present

## 2021-09-11 DIAGNOSIS — N2581 Secondary hyperparathyroidism of renal origin: Secondary | ICD-10-CM | POA: Diagnosis not present

## 2021-09-11 DIAGNOSIS — T8249XA Other complication of vascular dialysis catheter, initial encounter: Secondary | ICD-10-CM | POA: Diagnosis not present

## 2021-09-11 DIAGNOSIS — L299 Pruritus, unspecified: Secondary | ICD-10-CM | POA: Diagnosis not present

## 2021-09-11 DIAGNOSIS — T8249XS Other complication of vascular dialysis catheter, sequela: Secondary | ICD-10-CM | POA: Diagnosis not present

## 2021-09-11 DIAGNOSIS — N186 End stage renal disease: Secondary | ICD-10-CM | POA: Diagnosis not present

## 2021-09-14 NOTE — Progress Notes (Unsigned)
    Postoperative Access Visit   History of Present Illness   [name] is a 46 y.o. year old female who presents for postoperative follow-up for: Ligation of left brachiobasilic arteriovenous fistula, Repair and excision of left brachiobasilic pseudoaneurysm adjacent to the arterial anastomosis And Excision of necrotic skin over left arm AV fistula by Dr. Carlis Abbott on 08/26/21. 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 She was initially seen post op on 09/01/21 for JP removal. The JP at time of presentation was clogged so it was removed. Her AC incision sutures were kept in place and she was given instructions for wound care and advised to follow up in 2 weeks. She is here today for her follow up. The patient's wounds are well healed.  The patient notes no steal symptoms.   She currently dialyzes via right IJ TDC on MWF at the [facility]  Physical Examination   Vitals:   09/15/21 0947  BP: (!) 155/99  Pulse: 71  Resp: 20  Temp: (!) 97.3 F (36.3 C)  TempSrc: Temporal  SpO2: 97%  Weight: (!) 303 lb 1.6 oz (137.5 kg)  Height: '5\' 9"'$  (1.753 m)   Body mass index is 44.76 kg/m.  left arm Incision is well healed- sutures removed, 2+ radial pulse, hand grip is 5/5, sensation in digits is intact, she does have palpable area of fullness proximal to suture line. Not sure if this is residual vein where pseudo was repaired vs seroma/ hematoma. No overlying erythema. No tenderness    Medical Decision Making   [name] is a 46 y.o. year old female who presents s/p Ligation of left brachiobasilic arteriovenous fistula, Repair and excision of left brachiobasilic pseudoaneurysm adjacent to the arterial anastomosis And Excision of necrotic skin over left arm AV fistula by Dr. Carlis Abbott on 08/26/21. Her AC incision is now well healed. Her sutures were removed today. Patent is without signs or symptoms of steal syndrome. Based on  recent Fistulogram She had fistula that was failing and recommendation at that time was conversion to AV graft however she ended up undergoing the above surgery due to ulceration present at the time. She is now indicated for left upper arm AV graft Will arrange for her to have an AV graft with Dr. Carlis Abbott. Will have this scheduled in the near future. She dialyzes on MWF so will try to arrange this on a non dialysis day   Karoline Caldwell, PA-C [facility] Office: [facility] MD: Roxanne Mins

## 2021-09-15 ENCOUNTER — Other Ambulatory Visit: Payer: Self-pay

## 2021-09-15 ENCOUNTER — Encounter: Payer: Self-pay | Admitting: Physician Assistant

## 2021-09-15 ENCOUNTER — Ambulatory Visit (INDEPENDENT_AMBULATORY_CARE_PROVIDER_SITE_OTHER): Payer: Medicare Other | Admitting: Physician Assistant

## 2021-09-15 VITALS — BP 155/99 | HR 71 | Temp 97.3°F | Resp 20 | Ht 69.0 in | Wt 303.1 lb

## 2021-09-15 DIAGNOSIS — N186 End stage renal disease: Secondary | ICD-10-CM

## 2021-09-15 DIAGNOSIS — Z992 Dependence on renal dialysis: Secondary | ICD-10-CM

## 2021-09-16 DIAGNOSIS — N186 End stage renal disease: Secondary | ICD-10-CM | POA: Diagnosis not present

## 2021-09-16 DIAGNOSIS — T8249XA Other complication of vascular dialysis catheter, initial encounter: Secondary | ICD-10-CM | POA: Diagnosis not present

## 2021-09-16 DIAGNOSIS — N2581 Secondary hyperparathyroidism of renal origin: Secondary | ICD-10-CM | POA: Diagnosis not present

## 2021-09-16 DIAGNOSIS — Z992 Dependence on renal dialysis: Secondary | ICD-10-CM | POA: Diagnosis not present

## 2021-09-16 DIAGNOSIS — D689 Coagulation defect, unspecified: Secondary | ICD-10-CM | POA: Diagnosis not present

## 2021-09-16 DIAGNOSIS — T8249XS Other complication of vascular dialysis catheter, sequela: Secondary | ICD-10-CM | POA: Diagnosis not present

## 2021-09-16 DIAGNOSIS — L299 Pruritus, unspecified: Secondary | ICD-10-CM | POA: Diagnosis not present

## 2021-09-23 DIAGNOSIS — T8249XA Other complication of vascular dialysis catheter, initial encounter: Secondary | ICD-10-CM | POA: Diagnosis not present

## 2021-09-23 DIAGNOSIS — N2581 Secondary hyperparathyroidism of renal origin: Secondary | ICD-10-CM | POA: Diagnosis not present

## 2021-09-23 DIAGNOSIS — D689 Coagulation defect, unspecified: Secondary | ICD-10-CM | POA: Diagnosis not present

## 2021-09-23 DIAGNOSIS — T8249XS Other complication of vascular dialysis catheter, sequela: Secondary | ICD-10-CM | POA: Diagnosis not present

## 2021-09-23 DIAGNOSIS — Z992 Dependence on renal dialysis: Secondary | ICD-10-CM | POA: Diagnosis not present

## 2021-09-23 DIAGNOSIS — L299 Pruritus, unspecified: Secondary | ICD-10-CM | POA: Diagnosis not present

## 2021-09-23 DIAGNOSIS — N186 End stage renal disease: Secondary | ICD-10-CM | POA: Diagnosis not present

## 2021-09-28 DIAGNOSIS — T8249XS Other complication of vascular dialysis catheter, sequela: Secondary | ICD-10-CM | POA: Diagnosis not present

## 2021-09-28 DIAGNOSIS — N186 End stage renal disease: Secondary | ICD-10-CM | POA: Diagnosis not present

## 2021-09-28 DIAGNOSIS — T8249XA Other complication of vascular dialysis catheter, initial encounter: Secondary | ICD-10-CM | POA: Diagnosis not present

## 2021-09-28 DIAGNOSIS — Z992 Dependence on renal dialysis: Secondary | ICD-10-CM | POA: Diagnosis not present

## 2021-09-28 DIAGNOSIS — N2581 Secondary hyperparathyroidism of renal origin: Secondary | ICD-10-CM | POA: Diagnosis not present

## 2021-09-28 DIAGNOSIS — L299 Pruritus, unspecified: Secondary | ICD-10-CM | POA: Diagnosis not present

## 2021-09-28 DIAGNOSIS — D689 Coagulation defect, unspecified: Secondary | ICD-10-CM | POA: Diagnosis not present

## 2021-10-02 DIAGNOSIS — N186 End stage renal disease: Secondary | ICD-10-CM | POA: Diagnosis not present

## 2021-10-02 DIAGNOSIS — T8249XS Other complication of vascular dialysis catheter, sequela: Secondary | ICD-10-CM | POA: Diagnosis not present

## 2021-10-02 DIAGNOSIS — N2581 Secondary hyperparathyroidism of renal origin: Secondary | ICD-10-CM | POA: Diagnosis not present

## 2021-10-02 DIAGNOSIS — D689 Coagulation defect, unspecified: Secondary | ICD-10-CM | POA: Diagnosis not present

## 2021-10-02 DIAGNOSIS — L299 Pruritus, unspecified: Secondary | ICD-10-CM | POA: Diagnosis not present

## 2021-10-02 DIAGNOSIS — Z992 Dependence on renal dialysis: Secondary | ICD-10-CM | POA: Diagnosis not present

## 2021-10-02 DIAGNOSIS — T8249XA Other complication of vascular dialysis catheter, initial encounter: Secondary | ICD-10-CM | POA: Diagnosis not present

## 2021-10-03 DIAGNOSIS — N041 Nephrotic syndrome with focal and segmental glomerular lesions: Secondary | ICD-10-CM | POA: Diagnosis not present

## 2021-10-03 DIAGNOSIS — N186 End stage renal disease: Secondary | ICD-10-CM | POA: Diagnosis not present

## 2021-10-03 DIAGNOSIS — Z992 Dependence on renal dialysis: Secondary | ICD-10-CM | POA: Diagnosis not present

## 2021-10-06 ENCOUNTER — Ambulatory Visit (INDEPENDENT_AMBULATORY_CARE_PROVIDER_SITE_OTHER): Payer: Medicare Other | Admitting: Vascular Surgery

## 2021-10-06 VITALS — BP 174/108 | HR 75 | Temp 97.8°F | Resp 16 | Ht 69.5 in | Wt 304.0 lb

## 2021-10-06 DIAGNOSIS — Z992 Dependence on renal dialysis: Secondary | ICD-10-CM

## 2021-10-06 DIAGNOSIS — N186 End stage renal disease: Secondary | ICD-10-CM

## 2021-10-06 NOTE — Progress Notes (Signed)
Patient name: [name] MRN: [id number] DOB: [birth year] Sex: female  REASON FOR CONSULT: Postop  HPI: [name] is a 46 y.o. female, with end-stage renal disease the presents for postop check.  Most recently she underwent ligation and excision of a pseudoaneurysm in her left upper arm from a brachiobasilic AV fistula on 0/62/6948.  Given evidence of necrotic skin with purulence we did not place a new left arm graft.  She is scheduled for new dialysis access on Monday.  She has multiple failed accesses in the right arm including an old right arm graft.  Past Medical History:  Diagnosis Date   Anemia    Anxiety    pt not aware of this   Arthritis    Breast mass 09/01/2016   Targeted ultrasound is performed, showing a mass in the left breast at 11 o'clock, 1 cm from the nipple measuring 6.4 by 5.0 cm. The mass is heterogeneous and may contain macroscopic fat.   IMPRESSION: The mass in the left breast is probably benign, possibly a Hamartoma.  RECOMMENDATION: Recommend six-month follow-up mammogram of the probably benign left breast mass. If necessary, an ultrasound co   CHF (congestive heart failure) ([place])    hosp. 2011   Chronic kidney disease    Mon Wed Friday   Dilated cardiomyopathy ([place])    Encounter for blood transfusion    ESRD (end stage renal disease) ([place])    MWF - [place]   Family history of adverse reaction to anesthesia     " Aunt has a tracheostomy and kept coughing after colonoscopy "   Foot fracture    Gout    no flare up for several yrs   Headache    Heart murmur    as a child, no problems as an adult   Hemodialysis patient ([place])    History of blood transfusion    x 2   History of pneumonia    Hyperlipidemia    Hypertension    Menorrhagia    Morbid obesity ([place])    Pneumonia    hosp. 2010   Shortness of breath    HX CHL - occasional. 04/09/21 patient sits down and shortness of breath susides.   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   Thyroid disease     Past Surgical History:  Procedure Laterality Date   A/V FISTULAGRAM Left 08/13/2021   Procedure: A/V Fistulagram;  Surgeon: Marty Heck, MD;  Location: [facility];  Service: Cardiovascular;  Laterality: Left;   ANGIOPLASTY  07/28/2015   Procedure: ANGIOPLASTY OF RIGHT BRACHIAL VEIN;  Surgeon: Conrad [place], MD;  Location: [facility];  Service: Vascular;;   AV FISTULA PLACEMENT  08/04/2011   Procedure: ARTERIOVENOUS (AV) FISTULA CREATION;  Surgeon: Elam Dutch, MD;  Location: [facility];  Service: Vascular;  Laterality: Right;  Creation right brachiocephalic arteriovenous fistula   AV FISTULA PLACEMENT Left 02/05/2013   Procedure: ARTERIOVENOUS FISTULA CREATION LEFT ARM;  Surgeon: Conrad [place], MD;  Location: [facility];  Service: Vascular;  Laterality: Left;   AV FISTULA PLACEMENT Right 06/04/2013   Procedure: ARTERIOVENOUS (AV) FISTULA CREATION;  Surgeon: Conrad [place], MD;  Location: [facility];  Service: Vascular;  Laterality: Right;   AV FISTULA PLACEMENT Right 04/15/2014   Procedure: INSERTION OF ARTERIOVENOUS (AV) GORE-TEX GRAFT ARM;  Surgeon: Conrad [place], MD;  Location: [facility];  Service: Vascular;  Laterality: Right;   AV FISTULA PLACEMENT Left 02/25/2021   Procedure: LEFT ARM ARTERIOVENOUS (AV) FISTULA CREATION;  Surgeon: Marty Heck, MD;  Location: [facility];  Service: Vascular;  Laterality: Left;   AV FISTULA PLACEMENT Left 08/26/2021   Procedure: LEFT ARM LIGATION OF BRACHIOBASILIC FISTULA,  REPAIR PSEUDOANEURYSM;  Surgeon: Marty Heck, MD;  Location: [facility];  Service: Vascular;  Laterality: Left;   [place] Right 08/13/2013   Procedure: Second Stage Brachial Vein Transposition;  Surgeon: Conrad Filley, MD;  Location: [facility];  Service: Vascular;  Laterality: Right;   [place] Left  04/13/2021   Procedure: LEFT ARM SECOND STAGE BASILIC VEIN TRANSPOSITION;  Surgeon: Marty Heck, MD;  Location: [facility];  Service: Vascular;  Laterality: Left;   CARDIAC SURGERY  1979   repair of hole in heart   INCISION AND DRAINAGE OF WOUND Right 07/28/2015   Procedure: IRRIGATION AND DEBRIDEMENT RIGHT HAND  WOUND;  Surgeon: Dayna Barker, MD;  Location: [facility];  Service: Plastics;  Laterality: Right;   LIGATION OF ARTERIOVENOUS  FISTULA Left 03/05/2013   Procedure: LIGATION OF ARTERIOVENOUS  FISTULA;  Surgeon: Conrad Causey, MD;  Location: [facility];  Service: Vascular;  Laterality: Left;   PATCH ANGIOPLASTY Left 03/05/2013   Procedure: PATCH ANGIOPLASTY;  Surgeon: Conrad [place], MD;  Location: [facility];  Service: Vascular;  Laterality: Left;   PERIPHERAL VASCULAR BALLOON ANGIOPLASTY Left 08/13/2021   Procedure: PERIPHERAL VASCULAR BALLOON ANGIOPLASTY;  Surgeon: Marty Heck, MD;  Location: [facility];  Service: Cardiovascular;  Laterality: Left;  arm fistula   PERIPHERAL VASCULAR CATHETERIZATION Right 10/31/2014   Procedure: Fistulagram;  Surgeon: Conrad [place], MD;  Location: [facility];  Service: Cardiovascular;  Laterality: Right;   PERIPHERAL VASCULAR CATHETERIZATION Right 10/31/2014   Procedure: Peripheral Vascular Balloon Angioplasty;  Surgeon: Conrad [place], MD;  Location: [facility];  Service: Cardiovascular;  Laterality: Right;  pta venous rt arm   PERIPHERAL VASCULAR CATHETERIZATION N/A 03/13/2015   Procedure: A/V Shuntogram;  Surgeon: Conrad [place], MD;  Location: [facility];  Service: Cardiovascular;  Laterality: N/A;   PERIPHERAL VASCULAR CATHETERIZATION Right 03/13/2015   Procedure: Peripheral Vascular Balloon Angioplasty;  Surgeon: Conrad [place], MD;  Location: [facility];  Service: Cardiovascular;  Laterality: Right;  Shunt   PERIPHERAL VASCULAR CATHETERIZATION Right 01/19/2016   Procedure: A/V Shuntogram;  Surgeon: Angelia Mould, MD;   Location: [facility];  Service: Cardiovascular;  Laterality: Right;   REVISION OF ARTERIOVENOUS GORETEX GRAFT Right 10/27/2015   Procedure: THROMBECTOMY AND REVISION OF RIGHT UPPER ARM  ARTERIOVENOUS GORETEX GRAFT USING 6MM X 10CM GORE-TEX GRAFT;  Surgeon: Elam Dutch, MD;  Location: [facility];  Service: Vascular;  Laterality: Right;   REVISION OF ARTERIOVENOUS GORETEX GRAFT Right 01/30/2016   Procedure: REVISION OF RIGHT UPPER ARM ARTERIOVENOUS USING GORETEX GRAFT;  Surgeon: Angelia Mould, MD;  Location: [facility];  Service: Vascular;  Laterality: Right;   THROMBECTOMY W/ EMBOLECTOMY Left 03/05/2013   Procedure: THROMBECTOMY OF LEFT BRACHIAL ARTERY;  Surgeon: Conrad [place], MD;  Location: [facility];  Service: Vascular;  Laterality: Left;   VENOGRAM Bilateral 03/25/2014   Procedure: VENOGRAM bilateral;  Surgeon: Conrad Harrod, MD;  Location: [facility];  Service: Cardiovascular;  Laterality: Bilateral;    Family History  Problem Relation Age  of Onset   Diabetes Mother    Hyperlipidemia Mother    Hypertension Mother    Kidney disease Mother    Anesthesia problems Neg Hx    Hypotension Neg Hx    Malignant hyperthermia Neg Hx    Pseudochol deficiency Neg Hx     SOCIAL HISTORY: Social History   Socioeconomic History   Marital status: Married    Spouse name: Not on file   Number of children: Not on file   Years of education: Not on file   Highest education level: Not on file  Occupational History   Not on file  Tobacco Use   Smoking status: Never   Smokeless tobacco: Never  Vaping Use   Vaping Use: Never used  Substance and Sexual Activity   Alcohol use: No    Alcohol/week: 0.0 standard drinks of alcohol   Drug use: No   Sexual activity: Not Currently    Comment: no birth control   Other Topics Concern   Not on file  Social History Narrative   Not on file   Social Determinants of Health   Financial Resource Strain: Not on file  Food Insecurity: No Food Insecurity  (08/17/2021)   Hunger Vital Sign    Worried About Running Out of Food in the Last Year: Never true    Ran Out of Food in the Last Year: Never true  Transportation Needs: No Transportation Needs (08/17/2021)   PRAPARE - Hydrologist (Medical): No    Lack of Transportation (Non-Medical): No  Physical Activity: Not on file  Stress: Not on file  Social Connections: Not on file  Intimate Partner Violence: Not on file    Allergies  Allergen Reactions   Pollen Extract Itching    Unknown   Oxycodone-Acetaminophen Itching    Tolerates with benadryl     Current Outpatient Medications  Medication Sig Dispense Refill   Aspirin-Caffeine (BAYER BACK & BODY) 500-32.5 MG TABS Take 2 tablets by mouth 2 (two) times daily as needed (pain.).     AURYXIA 1 GM 210 MG(Fe) tablet Take 630 mg by mouth See admin instructions. Take 3 tablets (630 mg) by mouth with meals and snacks     cinacalcet (SENSIPAR) 60 MG tablet Take 60 mg by mouth in the morning.     megestrol (MEGACE) 20 MG tablet Take 2 tablets (40 mg total) by mouth daily. 60 tablet 11   PROAIR HFA 108 (90 Base) MCG/ACT inhaler Inhale 1-2 puffs into the lungs 3 (three) times daily as needed for wheezing or shortness of breath. For wheezing.  2   No current facility-administered medications for this visit.    REVIEW OF SYSTEMS:  '[X]'$  denotes positive finding, '[ ]'$  denotes negative finding Cardiac  Comments:  Chest pain or chest pressure:    Shortness of breath upon exertion:    Short of breath when lying flat:    Irregular heart rhythm:        Vascular    Pain in calf, thigh, or hip brought on by ambulation:    Pain in feet at night that wakes you up from your sleep:     Blood clot in your veins:    Leg swelling:         Pulmonary    Oxygen at home:    Productive cough:     Wheezing:         Neurologic    Sudden weakness in arms or legs:  Sudden numbness in arms or legs:     Sudden onset of difficulty  speaking or slurred speech:    Temporary loss of vision in one eye:     Problems with dizziness:         Gastrointestinal    Blood in stool:     Vomited blood:         Genitourinary    Burning when urinating:     Blood in urine:        Psychiatric    Major depression:         Hematologic    Bleeding problems:    Problems with blood clotting too easily:        Skin    Rashes or ulcers:        Constitutional    Fever or chills:      PHYSICAL EXAM: Vitals:   10/06/21 0959  BP: (!) 174/108  Pulse: 75  Resp: 16  Temp: 97.8 F (36.6 C)  TempSrc: Temporal  SpO2: 98%  Weight: (!) 304 lb (137.9 kg)  Height: 5' 9.5" (1.765 m)    GENERAL: The patient is a well-nourished female, in no acute distress. The vital signs are documented above. CARDIAC: There is a regular rate and rhythm.  VASCULAR:  Left arm incisions healed with no signs of infection Left brachial and radial pulse palpable No left upper extremity tissue loss  DATA:   None  Assessment/Plan:  46 y.o. female, with end-stage renal disease that presents for postop check.  Most recently she underwent ligation and excision of a pseudoaneurysm in her left upper arm from a brachiobasilic AV fistula on 09/05/1113.  Given evidence of necrotic skin with purulence we did not place a new left arm graft at that time.    Now scheduled for Monday for a new left arm AV graft.  Discussed I think we can proceed given her arm incisions have all healed with no signs of infection.  Risk benefits again discussed.  She wishes to proceed.   Marty Heck, MD [facility] Office: [phone]

## 2021-10-09 DIAGNOSIS — D689 Coagulation defect, unspecified: Secondary | ICD-10-CM | POA: Diagnosis not present

## 2021-10-09 DIAGNOSIS — Z992 Dependence on renal dialysis: Secondary | ICD-10-CM | POA: Diagnosis not present

## 2021-10-09 DIAGNOSIS — T8249XS Other complication of vascular dialysis catheter, sequela: Secondary | ICD-10-CM | POA: Diagnosis not present

## 2021-10-09 DIAGNOSIS — T8249XA Other complication of vascular dialysis catheter, initial encounter: Secondary | ICD-10-CM | POA: Diagnosis not present

## 2021-10-09 DIAGNOSIS — Z23 Encounter for immunization: Secondary | ICD-10-CM | POA: Diagnosis not present

## 2021-10-09 DIAGNOSIS — N186 End stage renal disease: Secondary | ICD-10-CM | POA: Diagnosis not present

## 2021-10-09 DIAGNOSIS — N2581 Secondary hyperparathyroidism of renal origin: Secondary | ICD-10-CM | POA: Diagnosis not present

## 2021-10-09 DIAGNOSIS — L299 Pruritus, unspecified: Secondary | ICD-10-CM | POA: Diagnosis not present

## 2021-10-09 NOTE — Progress Notes (Addendum)
I've left messages on Ms [facility] phone asking her to call me back. I called patient's sister, she was going to try to get int ouch with Ms Skeet Latch. I called  Ms Assad again @ [phone] and left instructions- arrive at 0530 on Monday, nothing to eat or drink after midnight, wash up very well with antibiotic soap, dry off with a clean towel. Instructions continued:  do not wear lotion, powders, cologne deodorant,  nail polish, jewelry, brush your teeth wear clean comfortable clothes- May take Megace with a sip of water. Use Pro-Air if needed - bring it with you. I left Pre- Surgery phone number.  I informed patient that she needs someone to drive her home and stay with her until she goes to dialysis on Tuesday. ( Patient goes on Monday, but will go on Tuesday next week.  Ms Falconi called me after I finished voice message, she listened to voice message, we reviewed instructions. I gave patient the pre -surgery phone number.

## 2021-10-11 NOTE — Anesthesia Preprocedure Evaluation (Signed)
Anesthesia Evaluation  Patient identified by MRN, date of birth, ID band Patient awake    Reviewed: Allergy & Precautions, NPO status , Patient's Chart, lab work & pertinent test results  Airway Mallampati: III  TM Distance: >3 FB Neck ROM: Full    Dental no notable dental hx.    Pulmonary    Pulmonary exam normal        Cardiovascular hypertension, + Peripheral Vascular Disease and +CHF   Rhythm:Regular Rate:Normal     Neuro/Psych  Headaches  Anxiety        GI/Hepatic negative GI ROS, Neg liver ROS,,,  Endo/Other  negative endocrine ROS    Renal/GU ESRF and DialysisRenal disease  negative genitourinary   Musculoskeletal  (+) Arthritis , Osteoarthritis,    Abdominal Normal abdominal exam  (+)   Peds  Hematology  (+) Blood dyscrasia, anemia   Anesthesia Other Findings   Reproductive/Obstetrics                             Anesthesia Physical Anesthesia Plan  ASA: 3  Anesthesia Plan: MAC and Regional   Post-op Pain Management: Regional block*   Induction: Intravenous  PONV Risk Score and Plan: 2 and Ondansetron, Dexamethasone, Propofol infusion, Midazolam and Treatment may vary due to age or medical condition  Airway Management Planned: Simple Face Mask, Natural Airway and Nasal Cannula  Additional Equipment: None  Intra-op Plan:   Post-operative Plan:   Informed Consent: I have reviewed the patients History and Physical, chart, labs and discussed the procedure including the risks, benefits and alternatives for the proposed anesthesia with the patient or authorized representative who has indicated his/her understanding and acceptance.     Dental advisory given  Plan Discussed with: CRNA  Anesthesia Plan Comments: (Lab Results      Component                Value               Date                      WBC                      8.7                 12/04/2018                 HGB                      10.5 (L)            08/26/2021                HCT                      31.0 (L)            08/26/2021                MCV                      104.0 (H)           12/04/2018                PLT  163                 12/04/2018            Lab Results      Component                Value               Date                      NA                       141                 08/26/2021                K                        3.5                 08/26/2021                CO2                      24                  12/04/2018                GLUCOSE                  87                  08/26/2021                BUN                      67 (H)              08/26/2021                CREATININE               >18.00 (H)          08/26/2021                CALCIUM                  9.3                 12/04/2018                GFRNONAA                 4 (L)               12/04/2018           )        Anesthesia Quick Evaluation

## 2021-10-12 ENCOUNTER — Ambulatory Visit (HOSPITAL_BASED_OUTPATIENT_CLINIC_OR_DEPARTMENT_OTHER): Payer: Medicare Other | Admitting: Anesthesiology

## 2021-10-12 ENCOUNTER — Encounter (HOSPITAL_COMMUNITY)
Admission: RE | Disposition: A | Discharge status: Home / Self Care | Payer: Self-pay | Source: Home / Self Care | Attending: Vascular Surgery

## 2021-10-12 ENCOUNTER — Other Ambulatory Visit: Payer: Self-pay

## 2021-10-12 ENCOUNTER — Ambulatory Visit (HOSPITAL_COMMUNITY): Payer: Medicare Other | Admitting: Anesthesiology

## 2021-10-12 ENCOUNTER — Ambulatory Visit (HOSPITAL_COMMUNITY)
Admission: RE | Admit: 2021-10-12 | Discharge: 2021-10-12 | Disposition: A | Discharge status: Home / Self Care | Payer: Medicare Other | Attending: Vascular Surgery | Admitting: Vascular Surgery

## 2021-10-12 ENCOUNTER — Encounter (HOSPITAL_COMMUNITY): Payer: Self-pay | Admitting: Vascular Surgery

## 2021-10-12 ENCOUNTER — Other Ambulatory Visit (HOSPITAL_COMMUNITY): Payer: Self-pay

## 2021-10-12 ENCOUNTER — Encounter (HOSPITAL_COMMUNITY): Payer: Self-pay

## 2021-10-12 DIAGNOSIS — N186 End stage renal disease: Secondary | ICD-10-CM | POA: Diagnosis not present

## 2021-10-12 DIAGNOSIS — I5021 Acute systolic (congestive) heart failure: Secondary | ICD-10-CM | POA: Insufficient documentation

## 2021-10-12 DIAGNOSIS — I132 Hypertensive heart and chronic kidney disease with heart failure and with stage 5 chronic kidney disease, or end stage renal disease: Secondary | ICD-10-CM | POA: Insufficient documentation

## 2021-10-12 DIAGNOSIS — N185 Chronic kidney disease, stage 5: Secondary | ICD-10-CM | POA: Diagnosis not present

## 2021-10-12 DIAGNOSIS — Z992 Dependence on renal dialysis: Secondary | ICD-10-CM | POA: Diagnosis not present

## 2021-10-12 DIAGNOSIS — T827XXA Infection and inflammatory reaction due to other cardiac and vascular devices, implants and grafts, initial encounter: Secondary | ICD-10-CM | POA: Diagnosis not present

## 2021-10-12 DIAGNOSIS — I509 Heart failure, unspecified: Secondary | ICD-10-CM | POA: Diagnosis not present

## 2021-10-12 DIAGNOSIS — D631 Anemia in chronic kidney disease: Secondary | ICD-10-CM | POA: Diagnosis not present

## 2021-10-12 HISTORY — PX: AV FISTULA PLACEMENT: SHX1204

## 2021-10-12 HISTORY — PX: GRAFT APPLICATION: SHX6696

## 2021-10-12 LAB — POCT I-STAT, CHEM 8
BUN: 45 mg/dL — ABNORMAL HIGH (ref 6–20)
Calcium, Ion: 1.01 mmol/L — ABNORMAL LOW (ref 1.15–1.40)
Chloride: 102 mmol/L (ref 98–111)
Creatinine, Ser: 14.1 mg/dL — ABNORMAL HIGH (ref 0.44–1.00)
Glucose, Bld: 100 mg/dL — ABNORMAL HIGH (ref 70–99)
HCT: 33 % — ABNORMAL LOW (ref 36.0–46.0)
Hemoglobin: 11.2 g/dL — ABNORMAL LOW (ref 12.0–15.0)
Potassium: 3.7 mmol/L (ref 3.5–5.1)
Sodium: 140 mmol/L (ref 135–145)
TCO2: 26 mmol/L (ref 22–32)

## 2021-10-12 LAB — POCT PREGNANCY, URINE: Preg Test, Ur: NEGATIVE

## 2021-10-12 SURGERY — GRAFT APPLICATION
Anesthesia: Monitor Anesthesia Care | Site: Arm Upper | Laterality: Left

## 2021-10-12 MED ORDER — FENTANYL CITRATE (PF) 100 MCG/2ML IJ SOLN
INTRAMUSCULAR | Status: DC | PRN
  Administered 2021-10-12: 25 ug via INTRAVENOUS
  Administered 2021-10-12: 50 ug via INTRAVENOUS
  Administered 2021-10-12: 25 ug via INTRAVENOUS

## 2021-10-12 MED ORDER — HEPARIN SODIUM (PORCINE) 1000 UNIT/ML IJ SOLN
3000.0000 [IU] | Freq: Once | INTRAMUSCULAR | Status: AC
  Administered 2021-10-12: 1900 [IU] via INTRAVENOUS
  Filled 2021-10-12: qty 3

## 2021-10-12 MED ORDER — MIDAZOLAM HCL 2 MG/2ML IJ SOLN
INTRAMUSCULAR | Status: AC
  Filled 2021-10-12: qty 2

## 2021-10-12 MED ORDER — PROPOFOL 10 MG/ML IV BOLUS
INTRAVENOUS | Status: AC
  Filled 2021-10-12: qty 20

## 2021-10-12 MED ORDER — HEPARIN SODIUM (PORCINE) 1000 UNIT/ML IJ SOLN
INTRAMUSCULAR | Status: DC | PRN
  Administered 2021-10-12: 3000 [IU] via INTRAVENOUS

## 2021-10-12 MED ORDER — CHLORHEXIDINE GLUCONATE 4 % EX LIQD: 60.0000 mL | Freq: Once | CUTANEOUS | Status: DC

## 2021-10-12 MED ORDER — PROPOFOL 500 MG/50ML IV EMUL
INTRAVENOUS | Status: DC | PRN
  Administered 2021-10-12: 50 ug/kg/min via INTRAVENOUS

## 2021-10-12 MED ORDER — SODIUM CHLORIDE 0.9 % IV SOLN: INTRAVENOUS | Status: DC

## 2021-10-12 MED ORDER — ROPIVACAINE HCL 5 MG/ML IJ SOLN
INTRAMUSCULAR | Status: DC | PRN
  Administered 2021-10-12: 10 mL via PERINEURAL

## 2021-10-12 MED ORDER — MEPIVACAINE HCL (PF) 2 % IJ SOLN
INTRAMUSCULAR | Status: DC | PRN
  Administered 2021-10-12: 20 mL

## 2021-10-12 MED ORDER — CEFAZOLIN IN SODIUM CHLORIDE 3-0.9 GM/100ML-% IV SOLN
3.0000 g | INTRAVENOUS | Status: DC
  Filled 2021-10-12: qty 100

## 2021-10-12 MED ORDER — MIDAZOLAM HCL 5 MG/5ML IJ SOLN
INTRAMUSCULAR | Status: DC | PRN
  Administered 2021-10-12: 2 mg via INTRAVENOUS

## 2021-10-12 MED ORDER — OXYCODONE-ACETAMINOPHEN 5-325 MG PO TABS
1.0000 | ORAL_TABLET | Freq: Four times a day (QID) | ORAL | 0 refills | Status: DC | PRN
  Filled 2021-10-12: qty 20, 5d supply, fill #0

## 2021-10-12 MED ORDER — HEPARIN 6000 UNIT IRRIGATION SOLUTION
Status: DC | PRN
  Administered 2021-10-12: 1

## 2021-10-12 MED ORDER — 0.9 % SODIUM CHLORIDE (POUR BTL) OPTIME
TOPICAL | Status: DC | PRN
  Administered 2021-10-12: 1000 mL

## 2021-10-12 MED ORDER — CHLORHEXIDINE GLUCONATE 0.12 % MT SOLN
15.0000 mL | Freq: Once | OROMUCOSAL | Status: AC
  Administered 2021-10-12: 15 mL via OROMUCOSAL
  Filled 2021-10-12: qty 15

## 2021-10-12 MED ORDER — PROPOFOL 10 MG/ML IV BOLUS
INTRAVENOUS | Status: DC | PRN
  Administered 2021-10-12 (×2): 30 mg via INTRAVENOUS
  Administered 2021-10-12 (×2): 20 mg via INTRAVENOUS

## 2021-10-12 MED ORDER — FENTANYL CITRATE (PF) 250 MCG/5ML IJ SOLN
INTRAMUSCULAR | Status: AC
  Filled 2021-10-12: qty 5

## 2021-10-12 MED ORDER — ORAL CARE MOUTH RINSE: 15.0000 mL | Freq: Once | OROMUCOSAL | Status: AC

## 2021-10-12 MED ORDER — DEXTROSE 5 % IV SOLN
INTRAVENOUS | Status: DC | PRN
  Administered 2021-10-12: 3 g via INTRAVENOUS

## 2021-10-12 MED ORDER — FENTANYL CITRATE (PF) 100 MCG/2ML IJ SOLN: 25.0000 ug | INTRAMUSCULAR | Status: DC | PRN

## 2021-10-12 MED ORDER — HEPARIN SODIUM (PORCINE) 1000 UNIT/ML IJ SOLN
INTRAMUSCULAR | Status: AC
  Filled 2021-10-12: qty 10

## 2021-10-12 SURGICAL SUPPLY — 40 items
ADH SKN CLS APL DERMABOND .7 (GAUZE/BANDAGES/DRESSINGS) ×2
AGENT HMST SPONGE THK3/8 (HEMOSTASIS)
ARMBAND PINK RESTRICT EXTREMIT (MISCELLANEOUS) ×6 IMPLANT
BAG COUNTER SPONGE SURGICOUNT (BAG) ×3 IMPLANT
BAG SPNG CNTER NS LX DISP (BAG) ×2
BLADE CLIPPER SURG (BLADE) ×3 IMPLANT
CANISTER SUCT 3000ML PPV (MISCELLANEOUS) ×3 IMPLANT
CLIP VESOCCLUDE MED 6/CT (CLIP) ×3 IMPLANT
CLIP VESOCCLUDE SM WIDE 6/CT (CLIP) ×3 IMPLANT
COVER PROBE W GEL 5X96 (DRAPES) ×3 IMPLANT
DERMABOND ADVANCED .7 DNX12 (GAUZE/BANDAGES/DRESSINGS) ×3 IMPLANT
ELECT REM PT RETURN 9FT ADLT (ELECTROSURGICAL) ×2
ELECTRODE REM PT RTRN 9FT ADLT (ELECTROSURGICAL) ×3 IMPLANT
GLOVE BIO SURGEON STRL SZ7.5 (GLOVE) ×3 IMPLANT
GLOVE BIOGEL PI IND STRL 8 (GLOVE) ×3 IMPLANT
GOWN STRL REUS W/ TWL LRG LVL3 (GOWN DISPOSABLE) ×6 IMPLANT
GOWN STRL REUS W/ TWL XL LVL3 (GOWN DISPOSABLE) ×6 IMPLANT
GOWN STRL REUS W/TWL LRG LVL3 (GOWN DISPOSABLE) ×4
GOWN STRL REUS W/TWL XL LVL3 (GOWN DISPOSABLE) ×4
GRAFT COLLAGEN VASCULAR 6X15 (Vascular Products) ×2 IMPLANT
GRAFT COLLAGEN VASCULAR 6X19 (Vascular Products) ×1 IMPLANT
HEMOSTAT SPONGE AVITENE ULTRA (HEMOSTASIS) IMPLANT
KIT BASIN OR (CUSTOM PROCEDURE TRAY) ×3 IMPLANT
KIT TURNOVER KIT B (KITS) ×3 IMPLANT
LOOP VESSEL MINI RED (MISCELLANEOUS) ×1 IMPLANT
NS IRRIG 1000ML POUR BTL (IV SOLUTION) ×3 IMPLANT
PACK CV ACCESS (CUSTOM PROCEDURE TRAY) ×3 IMPLANT
PAD ARMBOARD 7.5X6 YLW CONV (MISCELLANEOUS) ×6 IMPLANT
SLING ARM FOAM STRAP LRG (SOFTGOODS) ×1 IMPLANT
SLING ARM FOAM STRAP MED (SOFTGOODS) IMPLANT
SUT MNCRL AB 4-0 PS2 18 (SUTURE) ×4 IMPLANT
SUT PROLENE 5 0 C 1 24 (SUTURE) ×5 IMPLANT
SUT PROLENE 6 0 BV (SUTURE) ×7 IMPLANT
SUT SILK 3 0 (SUTURE) ×2
SUT SILK 3-0 18XBRD TIE 12 (SUTURE) ×1 IMPLANT
SUT VIC AB 3-0 SH 27 (SUTURE) ×6
SUT VIC AB 3-0 SH 27X BRD (SUTURE) ×7 IMPLANT
TOWEL GREEN STERILE (TOWEL DISPOSABLE) ×3 IMPLANT
UNDERPAD 30X36 HEAVY ABSORB (UNDERPADS AND DIAPERS) ×3 IMPLANT
WATER STERILE IRR 1000ML POUR (IV SOLUTION) ×3 IMPLANT

## 2021-10-12 NOTE — Discharge Instructions (Signed)
° °  [facility] ° °Discharge Instructions ° °AV Fistula or Graft Surgery for Dialysis Access ° °Please refer to the following instructions for your post-procedure care. Your surgeon or physician assistant will discuss any changes with you. ° °Activity ° °You may drive the day following your surgery, if you are comfortable and no longer taking prescription pain medication. Resume full activity as the soreness in your incision resolves. ° °Bathing/Showering ° °You may shower after you go home. Keep your incision dry for 48 hours. Do not soak in a bathtub, hot tub, or swim until the incision heals completely. You may not shower if you have a hemodialysis catheter. ° °Incision Care ° °Clean your incision with mild soap and water after 48 hours. Pat the area dry with a clean towel. You do not need a bandage unless otherwise instructed. Do not apply any ointments or creams to your incision. You may have skin glue on your incision. Do not peel it off. It will come off on its own in about one week. Your arm may swell a bit after surgery. To reduce swelling use pillows to elevate your arm so it is above your heart. Your doctor will tell you if you need to lightly wrap your arm with an ACE bandage. ° °Diet ° °Resume your normal diet. There are not special food restrictions following this procedure. In order to heal from your surgery, it is CRITICAL to get adequate nutrition. Your body requires vitamins, minerals, and protein. Vegetables are the best source of vitamins and minerals. Vegetables also provide the perfect balance of protein. Processed food has little nutritional value, so try to avoid this. ° °Medications ° °Resume taking all of your medications. If your incision is causing pain, you may take over-the counter pain relievers such as acetaminophen (Tylenol). If you were prescribed a stronger pain medication, please be aware these medications can cause nausea and constipation. Prevent  nausea by taking the medication with a snack or meal. Avoid constipation by drinking plenty of fluids and eating foods with high amount of fiber, such as fruits, vegetables, and grains. Do not take Tylenol if you are taking prescription pain medications. ° ° ° ° °Follow up °Your surgeon may want to see you in the office following your access surgery. If so, this will be arranged at the time of your surgery. ° °Please call us immediately for any of the following conditions: ° °Increased pain, redness, drainage (pus) from your incision site °Fever of 101 degrees or higher °Severe or worsening pain at your incision site °Hand pain or numbness. ° °Reduce your risk of vascular disease: ° °Stop smoking. If you would like help, call [facility] at 1-800-QUIT-NOW (1-800-784-8669) or [facility] at 336-586-4000 ° °Manage your cholesterol °Maintain a desired weight °Control your diabetes °Keep your blood pressure down ° °Dialysis ° °It will take several weeks to several months for your new dialysis access to be ready for use. Your surgeon will determine when it is OK to use it. Your nephrologist will continue to direct your dialysis. You can continue to use your Permcath until your new access is ready for use. ° °If you have any questions, please call the office at [phone]. ° °

## 2021-10-12 NOTE — Anesthesia Procedure Notes (Signed)
Anesthesia Regional Block: Supraclavicular block   Pre-Anesthetic Checklist: , timeout performed,  Correct Patient, Correct Site, Correct Laterality,  Correct Procedure, Correct Position, site marked,  Risks and benefits discussed,  Surgical consent,  Pre-op evaluation,  At surgeon's request and post-op pain management  Laterality: Right  Prep: Dura Prep       Needles:  Injection technique: Single-shot  Needle Type: Echogenic Stimulator Needle     Needle Length: 5cm  Needle Gauge: 20     Additional Needles:   Procedures:,,,, ultrasound used (permanent image in chart),,    Narrative:  Start time: 10/12/2021 7:14 AM End time: 10/12/2021 7:17 AM Injection made incrementally with aspirations every 5 mL.  Performed by: Personally  Anesthesiologist: Darral Dash, DO  Additional Notes: Patient identified. Risks/Benefits/Options discussed with patient including but not limited to bleeding, infection, nerve damage, failed block, incomplete pain control. Patient expressed understanding and wished to proceed. All questions were answered. Sterile technique was used throughout the entire procedure. Please see nursing notes for vital signs. Aspirated in 5cc intervals with injection for negative confirmation. Patient was given instructions on fall risk and not to get out of bed. All questions and concerns addressed with instructions to call with any issues or inadequate analgesia.

## 2021-10-12 NOTE — Transfer of Care (Signed)
Immediate Anesthesia Transfer of Care Note  Patient: [name]  Procedure(s) Performed: ARTEGRAFT COLLAGEN VASCULAR GRAFT APPLICATION (Left: Arm Upper) LEFT RADIAL / CEPHALIC ARTERIOVENOUS FISTULA  ARTERIOVENOUS (AV) FISTULA CREATION WITH INTERPOSITION GRAFT (Left: Arm Upper)  Patient Location: PACU  Anesthesia Type:MAC and Regional  Level of Consciousness: awake  Airway & Oxygen Therapy: Patient Spontanous Breathing and Patient connected to face mask oxygen  Post-op Assessment: Report given to RN and Post -op Vital signs reviewed and stable  Post vital signs: Reviewed and stable  Last Vitals:  Vitals Value Taken Time  BP 133/74 10/12/21 1003  Temp 36.5 C 10/12/21 1003  Pulse 89 10/12/21 1007  Resp 17 10/12/21 1007  SpO2 100 % 10/12/21 1007  Vitals shown include unvalidated device data.  Last Pain:  Vitals:   10/12/21 0654  TempSrc:   PainSc: 0-No pain         Complications: No notable events documented.

## 2021-10-12 NOTE — Anesthesia Postprocedure Evaluation (Signed)
Anesthesia Post Note  Patient: [name]  Procedure(s) Performed: ARTEGRAFT COLLAGEN VASCULAR GRAFT APPLICATION (Left: Arm Upper) LEFT RADIOCEPHALIC ARTERIOVENOUS FISTULA  ARTERIOVENOUS (AV) FISTULA CREATION WITH INTERPOSITION GRAFT (Left: Arm Upper)     Patient location during evaluation: PACU Anesthesia Type: Regional and MAC Level of consciousness: awake and alert Pain management: pain level controlled Vital Signs Assessment: post-procedure vital signs reviewed and stable Respiratory status: spontaneous breathing, nonlabored ventilation, respiratory function stable and patient connected to nasal cannula oxygen Cardiovascular status: stable and blood pressure returned to baseline Postop Assessment: no apparent nausea or vomiting Anesthetic complications: no   No notable events documented.  Last Vitals:  Vitals:   10/12/21 1015 10/12/21 1030  BP: (!) 140/99 131/85  Pulse: 85 81  Resp: 15 17  Temp:  36.7 C  SpO2: 99% 99%    Last Pain:  Vitals:   10/12/21 1030  TempSrc:   PainSc: 0-No pain                 Belenda Cruise P Arabela Basaldua

## 2021-10-12 NOTE — Op Note (Signed)
Date: October 12, 2021  Preoperative diagnosis: End-stage renal disease  Postoperative diagnosis: Same  Procedure: Left radial artery to cephalic vein upper arm AV fistula using Artegraft interposition  Surgeon: Dr. Marty Heck, MD  Assistant: Roxy Horseman, [place]  Indications: 46 year old female with end-stage renal disease that presents for permanent hemodialysis access.  She has a failed basilic vein fistula in the left arm.  She presents after risks benefits discussed.  Findings: Intraoperative ultrasound actually showed a nice cephalic vein in the upper arm.  Initially tried to mobilize cephalic vein into the proximal forearm in order to transpose this onto the brachial/radial artery in the proximal forearm but the vein was very friable and thin and not of good quality.  The vein was excellent above the elbow but due to her obesity we did not have enough length for transposition.  Given recent infected pseudoaneurysm in the antecubital region, I elected for an interposition with Artegraft from the proximal radial artery to the cephalic vein.  Excellent thrill at completion.  Palpable radial pulse.  Anesthesia: MAC  Details: Patient was taken to the operating room after informed consent was obtained.  Placed on the operative table in the supine position.  After anesthesia was induced, the left arm was prepped and draped in standard sterile fashion.  A timeout was performed and antibiotics were given.  Initially evaluated the cephalic vein in the left arm and this looked to be of good caliber above the elbow and actually had a lateral segment extending down into the proximal forearm.  Initially the plan was to try and transpose this to the proximal brachial/radial artery.  I made a longitudinal incision over the cephalic vein in the proximal forearm and this vein was dissected out with Bovie cautery.  I then made a small transverse incision over the brachial artery where it  bifurcated to the radial ulnar artery at the antecubital region and this was also dissected out.  All the branches were controlled with vessel loops.  We transected the cephalic vein in the proximal forearm and went to pass dilators the vein would not accept a dilator larger than 2.5 and ultimately was very frail and not sewable.  I then looked again at the cephalic vein in the upper arm where it was excellent quality above the elbow but we did not have enough length to transpose due to her obesity.  I then made an incision here to get out the cephalic vein in the distal upper arm.  I then brought a piece of Artegraft on the field and this was tunneled in the subcutaneous tissue from the proximal radial artery at the bifurcation to the cephalic vein in the distal upper arm that was much larger.  Patient was given 5000 units of IV heparin.  I then controlled the brachial artery with a vessel loop as well as ulnar and radial with vessel loops.  I made an arteriotomy in the proximal radial artery and extended this with potts scissors.  The Artegraft was spatulated and an end to side anastomosis was sewn with 6-0 Prolene to the proximal radial artery and this was de-aired prior to completion.  We had excellent pulsatile flow in the Artegraft.  I then had to mobilize cephalic vein in the distal upper arm through the counterincision and a end-to-end primary anastomosis was sewn here from the artegraft to the cephalic vein and this was de-aired prior to completion with 6-0 Prolene.  We had an excellent thrill in the  cephalic vein in the upper arm.  There was a palpable radial pulse.  All the incisions were irrigated out and closed with 3-0 Vicryl, 4-0 Monocryl and Dermabond.  Complication: None  Condition: Stable  Marty Heck, MD [facility] [place] Office: [place]

## 2021-10-12 NOTE — H&P (Signed)
History and Physical Interval Note:  10/12/2021 7:22 AM  [name]  has presented today for surgery, with the diagnosis of END STAGE RENAL DISEASE.  The various methods of treatment have been discussed with the patient and family. After consideration of risks, benefits and other options for treatment, the patient has consented to  Procedure(s): INSERTION OF ARTERIOVENOUS (AV) GORE-TEX GRAFT LEFT ARM (Left) as a surgical intervention.  The patient's history has been reviewed, patient examined, no change in status, stable for surgery.  I have reviewed the patient's chart and labs.  Questions were answered to the patient's satisfaction.    Plan new left arm access likely AV graft.  Marty Heck        Patient name: [name]     MRN: [id number]        DOB: 07-29-1975            Sex: female   REASON FOR CONSULT: Postop   HPI: [name] is a 46 y.o. female, with end-stage renal disease the presents for postop check.  Most recently she underwent ligation and excision of a pseudoaneurysm in her left upper arm from a brachiobasilic AV fistula on 0/24/0973.  Given evidence of necrotic skin with purulence we did not place a new left arm graft.  She is scheduled for new dialysis access on Monday.  She has multiple failed accesses in the right arm including an old right arm graft.       Past Medical History:  Diagnosis Date   Anemia     Anxiety      pt not aware of this   Arthritis     Breast mass 09/01/2016    Targeted ultrasound is performed, showing a mass in the left breast at 11 o'clock, 1 cm from the nipple measuring 6.4 by 5.0 cm. The mass is heterogeneous and may contain macroscopic fat.   IMPRESSION: The mass in the left breast is probably benign, possibly a Hamartoma.  RECOMMENDATION: Recommend six-month follow-up mammogram of the probably benign left breast mass. If necessary, an ultrasound co   CHF (congestive heart failure) ([place])      hosp. 2011   Chronic  kidney disease      Mon Wed Friday   Dilated cardiomyopathy ([place])     Encounter for blood transfusion     ESRD (end stage renal disease) ([place])      MWF - east    Family history of adverse reaction to anesthesia       " Aunt has a tracheostomy and kept coughing after colonoscopy "   Foot fracture     Gout      no flare up for several yrs   Headache     Heart murmur      as a child, no problems as an adult   Hemodialysis patient ([place])     History of blood transfusion      x 2   History of pneumonia     Hyperlipidemia     Hypertension     Menorrhagia     Morbid obesity ([place])     Pneumonia      hosp. 2010   Shortness of breath      HX CHL - occasional. 04/09/21 patient sits down and shortness of breath susides.   Syncope 53/29/9242   SYSTOLIC HEART FAILURE, ACUTE 04/20/2008    Qualifier: Diagnosis of  By: Orville Govern, CMA, [place]     Thromboembolism of upper extremity  artery ([place]) 03/30/2013   Thyroid disease             Past Surgical History:  Procedure Laterality Date   A/V FISTULAGRAM Left 08/13/2021    Procedure: A/V Fistulagram;  Surgeon: Marty Heck, MD;  Location: [place] [facility] [facility];  Service: Cardiovascular;  Laterality: Left;   ANGIOPLASTY   07/28/2015    Procedure: ANGIOPLASTY OF RIGHT BRACHIAL VEIN;  Surgeon: Conrad Babcock, MD;  Location: [facility];  Service: Vascular;;   AV FISTULA PLACEMENT   08/04/2011    Procedure: ARTERIOVENOUS (AV) FISTULA CREATION;  Surgeon: Elam Dutch, MD;  Location: [facility];  Service: Vascular;  Laterality: Right;  Creation right brachiocephalic arteriovenous fistula   AV FISTULA PLACEMENT Left 02/05/2013    Procedure: ARTERIOVENOUS FISTULA CREATION LEFT ARM;  Surgeon: Conrad Kualapuu, MD;  Location: [facility];  Service: Vascular;  Laterality: Left;   AV FISTULA PLACEMENT Right 06/04/2013    Procedure: ARTERIOVENOUS (AV) FISTULA CREATION;  Surgeon: Conrad [place], MD;  Location: [facility];  Service: Vascular;  Laterality: Right;   AV FISTULA  PLACEMENT Right 04/15/2014    Procedure: INSERTION OF ARTERIOVENOUS (AV) GORE-TEX GRAFT ARM;  Surgeon: Conrad Karlsruhe, MD;  Location: [facility];  Service: Vascular;  Laterality: Right;   AV FISTULA PLACEMENT Left 02/25/2021    Procedure: LEFT ARM ARTERIOVENOUS (AV) FISTULA CREATION;  Surgeon: Marty Heck, MD;  Location: [facility];  Service: Vascular;  Laterality: Left;   AV FISTULA PLACEMENT Left 08/26/2021    Procedure: LEFT ARM LIGATION OF BRACHIOBASILIC FISTULA,  REPAIR PSEUDOANEURYSM;  Surgeon: Marty Heck, MD;  Location: [facility];  Service: Vascular;  Laterality: Left;   [place] Right 08/13/2013    Procedure: Second Stage Brachial Vein Transposition;  Surgeon: Conrad [place], MD;  Location: [facility];  Service: Vascular;  Laterality: Right;   [place] Left 04/13/2021    Procedure: LEFT ARM SECOND STAGE BASILIC VEIN TRANSPOSITION;  Surgeon: Marty Heck, MD;  Location: [facility];  Service: Vascular;  Laterality: Left;   CARDIAC SURGERY   1979    repair of hole in heart   INCISION AND DRAINAGE OF WOUND Right 07/28/2015    Procedure: IRRIGATION AND DEBRIDEMENT RIGHT HAND  WOUND;  Surgeon: Dayna Barker, MD;  Location: [place] [facility];  Service: Plastics;  Laterality: Right;   LIGATION OF ARTERIOVENOUS  FISTULA Left 03/05/2013    Procedure: LIGATION OF ARTERIOVENOUS  FISTULA;  Surgeon: Conrad Pomona, MD;  Location: [facility];  Service: Vascular;  Laterality: Left;   PATCH ANGIOPLASTY Left 03/05/2013    Procedure: PATCH ANGIOPLASTY;  Surgeon: Conrad Hatton, MD;  Location: [facility];  Service: Vascular;  Laterality: Left;   PERIPHERAL VASCULAR BALLOON ANGIOPLASTY Left 08/13/2021    Procedure: PERIPHERAL VASCULAR BALLOON ANGIOPLASTY;  Surgeon: Marty Heck, MD;  Location: [facility];  Service: Cardiovascular;  Laterality: Left;  arm fistula   PERIPHERAL VASCULAR CATHETERIZATION Right 10/31/2014    Procedure: Fistulagram;  Surgeon: Conrad [place], MD;  Location: [facility]  [facility];  Service: Cardiovascular;  Laterality: Right;   PERIPHERAL VASCULAR CATHETERIZATION Right 10/31/2014    Procedure: Peripheral Vascular Balloon Angioplasty;  Surgeon: Conrad Makanda, MD;  Location: [facility];  Service: Cardiovascular;  Laterality: Right;  pta venous rt arm   PERIPHERAL VASCULAR CATHETERIZATION N/A 03/13/2015    Procedure: A/V Shuntogram;  Surgeon: Conrad Cedar [place], MD;  Location: [facility];  Service:  Cardiovascular;  Laterality: N/A;   PERIPHERAL VASCULAR CATHETERIZATION Right 03/13/2015    Procedure: Peripheral Vascular Balloon Angioplasty;  Surgeon: Conrad Stonewall, MD;  Location: [facility];  Service: Cardiovascular;  Laterality: Right;  Shunt   PERIPHERAL VASCULAR CATHETERIZATION Right 01/19/2016    Procedure: A/V Shuntogram;  Surgeon: Angelia Mould, MD;  Location: [facility];  Service: Cardiovascular;  Laterality: Right;   REVISION OF ARTERIOVENOUS GORETEX GRAFT Right 10/27/2015    Procedure: THROMBECTOMY AND REVISION OF RIGHT UPPER ARM  ARTERIOVENOUS GORETEX GRAFT USING 6MM X 10CM GORE-TEX GRAFT;  Surgeon: Elam Dutch, MD;  Location: [facility];  Service: Vascular;  Laterality: Right;   REVISION OF ARTERIOVENOUS GORETEX GRAFT Right 01/30/2016    Procedure: REVISION OF RIGHT UPPER ARM ARTERIOVENOUS USING GORETEX GRAFT;  Surgeon: Angelia Mould, MD;  Location: [facility];  Service: Vascular;  Laterality: Right;   THROMBECTOMY W/ EMBOLECTOMY Left 03/05/2013    Procedure: THROMBECTOMY OF LEFT BRACHIAL ARTERY;  Surgeon: Conrad Decatur, MD;  Location: [facility];  Service: Vascular;  Laterality: Left;   VENOGRAM Bilateral 03/25/2014    Procedure: VENOGRAM bilateral;  Surgeon: Conrad Montgomery, MD;  Location: [facility];  Service: Cardiovascular;  Laterality: Bilateral;           Family History  Problem Relation Age of Onset   Diabetes Mother     Hyperlipidemia Mother     Hypertension Mother     Kidney disease Mother     Anesthesia problems Neg Hx      Hypotension Neg Hx     Malignant hyperthermia Neg Hx     Pseudochol deficiency Neg Hx        SOCIAL HISTORY: Social History         Socioeconomic History   Marital status: Married      Spouse name: Not on file   Number of children: Not on file   Years of education: Not on file   Highest education level: Not on file  Occupational History   Not on file  Tobacco Use   Smoking status: Never   Smokeless tobacco: Never  Vaping Use   Vaping Use: Never used  Substance and Sexual Activity   Alcohol use: No      Alcohol/week: 0.0 standard drinks of alcohol   Drug use: No   Sexual activity: Not Currently      Comment: no birth control   Other Topics Concern   Not on file  Social History Narrative   Not on file    Social Determinants of Health        Financial Resource Strain: Not on file  Food Insecurity: No Food Insecurity (08/17/2021)    Hunger Vital Sign     Worried About Running Out of Food in the Last Year: Never true     Ran Out of Food in the Last Year: Never true  Transportation Needs: No Transportation Needs (08/17/2021)    PRAPARE - Armed forces logistics/support/administrative officer (Medical): No     Lack of Transportation (Non-Medical): No  Physical Activity: Not on file  Stress: Not on file  Social Connections: Not on file  Intimate Partner Violence: Not on file           Allergies  Allergen Reactions   Pollen Extract Itching      Unknown   Oxycodone-Acetaminophen Itching      Tolerates with benadryl  Current Outpatient Medications  Medication Sig Dispense Refill   Aspirin-Caffeine (BAYER BACK & BODY) 500-32.5 MG TABS Take 2 tablets by mouth 2 (two) times daily as needed (pain.).       AURYXIA 1 GM 210 MG(Fe) tablet Take 630 mg by mouth See admin instructions. Take 3 tablets (630 mg) by mouth with meals and snacks       cinacalcet (SENSIPAR) 60 MG tablet Take 60 mg by mouth in the morning.       megestrol (MEGACE) 20 MG tablet Take 2 tablets (40 mg  total) by mouth daily. 60 tablet 11   PROAIR HFA 108 (90 Base) MCG/ACT inhaler Inhale 1-2 puffs into the lungs 3 (three) times daily as needed for wheezing or shortness of breath. For wheezing.   2    No current facility-administered medications for this visit.      REVIEW OF SYSTEMS:  '[X]'$  denotes positive finding, '[ ]'$  denotes negative finding Cardiac   Comments:  Chest pain or chest pressure:      Shortness of breath upon exertion:      Short of breath when lying flat:      Irregular heart rhythm:             Vascular      Pain in calf, thigh, or hip brought on by ambulation:      Pain in feet at night that wakes you up from your sleep:       Blood clot in your veins:      Leg swelling:              Pulmonary      Oxygen at home:      Productive cough:       Wheezing:              Neurologic      Sudden weakness in arms or legs:       Sudden numbness in arms or legs:       Sudden onset of difficulty speaking or slurred speech:      Temporary loss of vision in one eye:       Problems with dizziness:              Gastrointestinal      Blood in stool:       Vomited blood:              Genitourinary      Burning when urinating:       Blood in urine:             Psychiatric      Major depression:              Hematologic      Bleeding problems:      Problems with blood clotting too easily:             Skin      Rashes or ulcers:             Constitutional      Fever or chills:          PHYSICAL EXAM:    Vitals:    10/06/21 0959  BP: (!) 174/108  Pulse: 75  Resp: 16  Temp: 97.8 F (36.6 C)  TempSrc: Temporal  SpO2: 98%  Weight: (!) 304 lb (137.9 kg)  Height: 5' 9.5" (1.765 m)      GENERAL: The patient is a well-nourished female, in no acute distress.  The vital signs are documented above. CARDIAC: There is a regular rate and rhythm.  VASCULAR:  Left arm incisions healed with no signs of infection Left brachial and radial pulse palpable No left upper  extremity tissue loss   DATA:    None   Assessment/Plan:   46 y.o. female, with end-stage renal disease that presents for postop check.  Most recently she underwent ligation and excision of a pseudoaneurysm in her left upper arm from a brachiobasilic AV fistula on 0/71/2197.  Given evidence of necrotic skin with purulence we did not place a new left arm graft at that time.     Now scheduled for Monday for a new left arm AV graft.  Discussed I think we can proceed given her arm incisions have all healed with no signs of infection.  Risk benefits again discussed.  She wishes to proceed.     Marty Heck, MD [facility] Office: [phone]

## 2021-10-12 NOTE — Anesthesia Procedure Notes (Signed)
Procedure Name: MAC Date/Time: 10/12/2021 7:46 AM  Performed by: Lieutenant Diego, CRNAPre-anesthesia Checklist: Patient identified, Emergency Drugs available, Suction available, Patient being monitored and Timeout performed Patient Re-evaluated:Patient Re-evaluated prior to induction Oxygen Delivery Method: Simple face mask Preoxygenation: Pre-oxygenation with 100% oxygen Induction Type: IV induction

## 2021-10-13 ENCOUNTER — Encounter (HOSPITAL_COMMUNITY): Payer: Self-pay | Admitting: Vascular Surgery

## 2021-10-14 DIAGNOSIS — N186 End stage renal disease: Secondary | ICD-10-CM | POA: Diagnosis not present

## 2021-10-14 DIAGNOSIS — D689 Coagulation defect, unspecified: Secondary | ICD-10-CM | POA: Diagnosis not present

## 2021-10-14 DIAGNOSIS — N2581 Secondary hyperparathyroidism of renal origin: Secondary | ICD-10-CM | POA: Diagnosis not present

## 2021-10-14 DIAGNOSIS — T8249XS Other complication of vascular dialysis catheter, sequela: Secondary | ICD-10-CM | POA: Diagnosis not present

## 2021-10-14 DIAGNOSIS — L299 Pruritus, unspecified: Secondary | ICD-10-CM | POA: Diagnosis not present

## 2021-10-14 DIAGNOSIS — T8249XA Other complication of vascular dialysis catheter, initial encounter: Secondary | ICD-10-CM | POA: Diagnosis not present

## 2021-10-14 DIAGNOSIS — Z992 Dependence on renal dialysis: Secondary | ICD-10-CM | POA: Diagnosis not present

## 2021-10-14 DIAGNOSIS — Z23 Encounter for immunization: Secondary | ICD-10-CM | POA: Diagnosis not present

## 2021-10-19 DIAGNOSIS — L299 Pruritus, unspecified: Secondary | ICD-10-CM | POA: Diagnosis not present

## 2021-10-19 DIAGNOSIS — N2581 Secondary hyperparathyroidism of renal origin: Secondary | ICD-10-CM | POA: Diagnosis not present

## 2021-10-19 DIAGNOSIS — N186 End stage renal disease: Secondary | ICD-10-CM | POA: Diagnosis not present

## 2021-10-19 DIAGNOSIS — Z992 Dependence on renal dialysis: Secondary | ICD-10-CM | POA: Diagnosis not present

## 2021-10-19 DIAGNOSIS — T8249XA Other complication of vascular dialysis catheter, initial encounter: Secondary | ICD-10-CM | POA: Diagnosis not present

## 2021-10-19 DIAGNOSIS — Z23 Encounter for immunization: Secondary | ICD-10-CM | POA: Diagnosis not present

## 2021-10-19 DIAGNOSIS — D689 Coagulation defect, unspecified: Secondary | ICD-10-CM | POA: Diagnosis not present

## 2021-10-19 DIAGNOSIS — T8249XS Other complication of vascular dialysis catheter, sequela: Secondary | ICD-10-CM | POA: Diagnosis not present

## 2021-10-22 ENCOUNTER — Other Ambulatory Visit: Payer: Self-pay | Admitting: *Deleted

## 2021-10-22 DIAGNOSIS — N186 End stage renal disease: Secondary | ICD-10-CM

## 2021-10-23 DIAGNOSIS — Z992 Dependence on renal dialysis: Secondary | ICD-10-CM | POA: Diagnosis not present

## 2021-10-23 DIAGNOSIS — N2581 Secondary hyperparathyroidism of renal origin: Secondary | ICD-10-CM | POA: Diagnosis not present

## 2021-10-23 DIAGNOSIS — L299 Pruritus, unspecified: Secondary | ICD-10-CM | POA: Diagnosis not present

## 2021-10-23 DIAGNOSIS — T8249XA Other complication of vascular dialysis catheter, initial encounter: Secondary | ICD-10-CM | POA: Diagnosis not present

## 2021-10-23 DIAGNOSIS — D689 Coagulation defect, unspecified: Secondary | ICD-10-CM | POA: Diagnosis not present

## 2021-10-23 DIAGNOSIS — Z23 Encounter for immunization: Secondary | ICD-10-CM | POA: Diagnosis not present

## 2021-10-23 DIAGNOSIS — N186 End stage renal disease: Secondary | ICD-10-CM | POA: Diagnosis not present

## 2021-10-23 DIAGNOSIS — T8249XS Other complication of vascular dialysis catheter, sequela: Secondary | ICD-10-CM | POA: Diagnosis not present

## 2021-10-28 DIAGNOSIS — N2581 Secondary hyperparathyroidism of renal origin: Secondary | ICD-10-CM | POA: Diagnosis not present

## 2021-10-28 DIAGNOSIS — L299 Pruritus, unspecified: Secondary | ICD-10-CM | POA: Diagnosis not present

## 2021-10-28 DIAGNOSIS — T8249XS Other complication of vascular dialysis catheter, sequela: Secondary | ICD-10-CM | POA: Diagnosis not present

## 2021-10-28 DIAGNOSIS — Z992 Dependence on renal dialysis: Secondary | ICD-10-CM | POA: Diagnosis not present

## 2021-10-28 DIAGNOSIS — D689 Coagulation defect, unspecified: Secondary | ICD-10-CM | POA: Diagnosis not present

## 2021-10-28 DIAGNOSIS — Z23 Encounter for immunization: Secondary | ICD-10-CM | POA: Diagnosis not present

## 2021-10-28 DIAGNOSIS — T8249XA Other complication of vascular dialysis catheter, initial encounter: Secondary | ICD-10-CM | POA: Diagnosis not present

## 2021-10-28 DIAGNOSIS — N186 End stage renal disease: Secondary | ICD-10-CM | POA: Diagnosis not present

## 2021-11-04 ENCOUNTER — Other Ambulatory Visit: Payer: Self-pay

## 2021-11-04 ENCOUNTER — Other Ambulatory Visit: Payer: Self-pay | Admitting: Internal Medicine

## 2021-11-04 DIAGNOSIS — Z1231 Encounter for screening mammogram for malignant neoplasm of breast: Secondary | ICD-10-CM

## 2021-11-04 DIAGNOSIS — N2581 Secondary hyperparathyroidism of renal origin: Secondary | ICD-10-CM | POA: Diagnosis not present

## 2021-11-04 DIAGNOSIS — T8249XS Other complication of vascular dialysis catheter, sequela: Secondary | ICD-10-CM | POA: Diagnosis not present

## 2021-11-04 DIAGNOSIS — N186 End stage renal disease: Secondary | ICD-10-CM | POA: Diagnosis not present

## 2021-11-04 DIAGNOSIS — D689 Coagulation defect, unspecified: Secondary | ICD-10-CM | POA: Diagnosis not present

## 2021-11-04 DIAGNOSIS — Z992 Dependence on renal dialysis: Secondary | ICD-10-CM | POA: Diagnosis not present

## 2021-11-04 DIAGNOSIS — L299 Pruritus, unspecified: Secondary | ICD-10-CM | POA: Diagnosis not present

## 2021-11-04 DIAGNOSIS — T8249XA Other complication of vascular dialysis catheter, initial encounter: Secondary | ICD-10-CM | POA: Diagnosis not present

## 2021-11-04 DIAGNOSIS — N921 Excessive and frequent menstruation with irregular cycle: Secondary | ICD-10-CM

## 2021-11-04 DIAGNOSIS — R52 Pain, unspecified: Secondary | ICD-10-CM | POA: Diagnosis not present

## 2021-11-04 DIAGNOSIS — L298 Other pruritus: Secondary | ICD-10-CM | POA: Diagnosis not present

## 2021-11-04 MED ORDER — MEGESTROL ACETATE 20 MG PO TABS: 20.0000 mg | ORAL_TABLET | Freq: Every day | ORAL | 3 refills | Status: DC

## 2021-11-09 DIAGNOSIS — L299 Pruritus, unspecified: Secondary | ICD-10-CM | POA: Diagnosis not present

## 2021-11-09 DIAGNOSIS — T8249XS Other complication of vascular dialysis catheter, sequela: Secondary | ICD-10-CM | POA: Diagnosis not present

## 2021-11-09 DIAGNOSIS — N2581 Secondary hyperparathyroidism of renal origin: Secondary | ICD-10-CM | POA: Diagnosis not present

## 2021-11-09 DIAGNOSIS — N186 End stage renal disease: Secondary | ICD-10-CM | POA: Diagnosis not present

## 2021-11-09 DIAGNOSIS — T8249XA Other complication of vascular dialysis catheter, initial encounter: Secondary | ICD-10-CM | POA: Diagnosis not present

## 2021-11-09 DIAGNOSIS — D689 Coagulation defect, unspecified: Secondary | ICD-10-CM | POA: Diagnosis not present

## 2021-11-09 DIAGNOSIS — Z992 Dependence on renal dialysis: Secondary | ICD-10-CM | POA: Diagnosis not present

## 2021-11-09 DIAGNOSIS — L298 Other pruritus: Secondary | ICD-10-CM | POA: Diagnosis not present

## 2021-11-09 DIAGNOSIS — R52 Pain, unspecified: Secondary | ICD-10-CM | POA: Diagnosis not present

## 2021-11-10 ENCOUNTER — Ambulatory Visit (HOSPITAL_COMMUNITY)
Admission: RE | Admit: 2021-11-10 | Discharge: 2021-11-10 | Disposition: A | Discharge status: Home / Self Care | Payer: Medicare Other | Source: Ambulatory Visit | Attending: Vascular Surgery | Admitting: Vascular Surgery

## 2021-11-10 ENCOUNTER — Ambulatory Visit (INDEPENDENT_AMBULATORY_CARE_PROVIDER_SITE_OTHER): Payer: Medicare Other | Admitting: Physician Assistant

## 2021-11-10 VITALS — BP 166/102 | HR 83 | Temp 98.3°F | Resp 20 | Ht 69.5 in | Wt 304.6 lb

## 2021-11-10 DIAGNOSIS — N186 End stage renal disease: Secondary | ICD-10-CM | POA: Insufficient documentation

## 2021-11-10 DIAGNOSIS — Z992 Dependence on renal dialysis: Secondary | ICD-10-CM | POA: Diagnosis not present

## 2021-11-10 NOTE — Progress Notes (Signed)
  POST OPERATIVE OFFICE NOTE    CC:  F/u for surgery  HPI:  [name] is a 46 y.o. female who is s/p left radial artery to cephalic vein upper arm AV fistula using Artegraft interposition on 10/12/2021 by Dr. Carlis Abbott.  The patient does have a history of left brachiobasilic AV fistula with a pseudoaneurysm, which required ligation of the fistula and pseudoaneurysm excision on 08/26/2021.  Pt returns today for follow up.  Pt states she has been doing well since surgery.  She still has some occasional shooting pains in her left wrist that have been present since her fistula ligation on 08/26/2021.  The nerve pains will come and go about 1-2 times a day, usually lasting about 1 minute.  She denies any numbness or tingling in her fingertips, excessively cold hands, or difficulty with grip strength.  She thinks that her incisions have been healing well.  She is still dialyzing via [place].   Allergies  Allergen Reactions   Pollen Extract Itching    Unknown   Oxycodone-Acetaminophen Itching    Tolerates with benadryl     Current Outpatient Medications  Medication Sig Dispense Refill   Aspirin-Caffeine (BAYER BACK & BODY) 500-32.5 MG TABS Take 2 tablets by mouth 2 (two) times daily as needed (pain.).     AURYXIA 1 GM 210 MG(Fe) tablet Take 630 mg by mouth See admin instructions. Take 3 tablets (630 mg) by mouth with meals and snacks     cinacalcet (SENSIPAR) 60 MG tablet Take 60 mg by mouth in the morning.     megestrol (MEGACE) 20 MG tablet Take 1 tablet (20 mg total) by mouth daily. 60 tablet 3   PROAIR HFA 108 (90 Base) MCG/ACT inhaler Inhale 1-2 puffs into the lungs 3 (three) times daily as needed for wheezing or shortness of breath. For wheezing.  2   No current facility-administered medications for this visit.     ROS:  See HPI  Physical Exam:  Incision: Left upper arm incisions are well-healed Extremities: Left upper arm AV fistula with great thrill on palpation.  Palpable left  radial pulse Neuro: Intact motor and sensation of the left upper extremity  Duplex study (11/10/2021)  Left arm duplex:  Patent left radial-cephalic fistula with increased velocity in the distal outflow vein at area of narrowing (1.5 cm in length)   Assessment/Plan:  This is a 46 y.o. female who is s/p: creation of left radiocephalic upper arm AV fistula with Artegraft interposition on 10/12/2021 by Dr. Carlis Abbott   -The fistula has a great thrill on palpation.  The fistula is easy to palpate and visualize in the [facility] fossa and lower bicep, however it does dive very deep into the arm at the mid bicep level.  However it has more than 6 cm of use past the anastomosis site in the [facility] fossa.   -The fistula has great inflow of over 2000 ml/min -I believe her fistula should be ready for use by January 04, 2022.  This will be 12 weeks after creation of the fistula.  If her fistula works well at dialysis for a couple of sessions, her nephrologist will arrange removal of her [facility] -She can follow-up with [place] as needed or if she has any issues with her fistula   Vicente Serene, PA-C [facility] [phone]   Clinic MD: [facility]

## 2021-11-11 DIAGNOSIS — Z992 Dependence on renal dialysis: Secondary | ICD-10-CM | POA: Diagnosis not present

## 2021-11-11 DIAGNOSIS — R52 Pain, unspecified: Secondary | ICD-10-CM | POA: Diagnosis not present

## 2021-11-11 DIAGNOSIS — D689 Coagulation defect, unspecified: Secondary | ICD-10-CM | POA: Diagnosis not present

## 2021-11-11 DIAGNOSIS — N186 End stage renal disease: Secondary | ICD-10-CM | POA: Diagnosis not present

## 2021-11-11 DIAGNOSIS — L298 Other pruritus: Secondary | ICD-10-CM | POA: Diagnosis not present

## 2021-11-11 DIAGNOSIS — T8249XA Other complication of vascular dialysis catheter, initial encounter: Secondary | ICD-10-CM | POA: Diagnosis not present

## 2021-11-11 DIAGNOSIS — N2581 Secondary hyperparathyroidism of renal origin: Secondary | ICD-10-CM | POA: Diagnosis not present

## 2021-11-11 DIAGNOSIS — T8249XS Other complication of vascular dialysis catheter, sequela: Secondary | ICD-10-CM | POA: Diagnosis not present

## 2021-11-11 DIAGNOSIS — L299 Pruritus, unspecified: Secondary | ICD-10-CM | POA: Diagnosis not present

## 2021-11-13 DIAGNOSIS — L299 Pruritus, unspecified: Secondary | ICD-10-CM | POA: Diagnosis not present

## 2021-11-13 DIAGNOSIS — T8249XA Other complication of vascular dialysis catheter, initial encounter: Secondary | ICD-10-CM | POA: Diagnosis not present

## 2021-11-13 DIAGNOSIS — R52 Pain, unspecified: Secondary | ICD-10-CM | POA: Diagnosis not present

## 2021-11-13 DIAGNOSIS — N186 End stage renal disease: Secondary | ICD-10-CM | POA: Diagnosis not present

## 2021-11-13 DIAGNOSIS — L298 Other pruritus: Secondary | ICD-10-CM | POA: Diagnosis not present

## 2021-11-13 DIAGNOSIS — N2581 Secondary hyperparathyroidism of renal origin: Secondary | ICD-10-CM | POA: Diagnosis not present

## 2021-11-13 DIAGNOSIS — Z992 Dependence on renal dialysis: Secondary | ICD-10-CM | POA: Diagnosis not present

## 2021-11-13 DIAGNOSIS — D689 Coagulation defect, unspecified: Secondary | ICD-10-CM | POA: Diagnosis not present

## 2021-11-13 DIAGNOSIS — T8249XS Other complication of vascular dialysis catheter, sequela: Secondary | ICD-10-CM | POA: Diagnosis not present

## 2021-11-18 DIAGNOSIS — L298 Other pruritus: Secondary | ICD-10-CM | POA: Diagnosis not present

## 2021-11-18 DIAGNOSIS — L299 Pruritus, unspecified: Secondary | ICD-10-CM | POA: Diagnosis not present

## 2021-11-18 DIAGNOSIS — R52 Pain, unspecified: Secondary | ICD-10-CM | POA: Diagnosis not present

## 2021-11-18 DIAGNOSIS — N186 End stage renal disease: Secondary | ICD-10-CM | POA: Diagnosis not present

## 2021-11-18 DIAGNOSIS — Z992 Dependence on renal dialysis: Secondary | ICD-10-CM | POA: Diagnosis not present

## 2021-11-18 DIAGNOSIS — T8249XA Other complication of vascular dialysis catheter, initial encounter: Secondary | ICD-10-CM | POA: Diagnosis not present

## 2021-11-18 DIAGNOSIS — T8249XS Other complication of vascular dialysis catheter, sequela: Secondary | ICD-10-CM | POA: Diagnosis not present

## 2021-11-18 DIAGNOSIS — D689 Coagulation defect, unspecified: Secondary | ICD-10-CM | POA: Diagnosis not present

## 2021-11-18 DIAGNOSIS — N2581 Secondary hyperparathyroidism of renal origin: Secondary | ICD-10-CM | POA: Diagnosis not present

## 2021-11-20 DIAGNOSIS — N2581 Secondary hyperparathyroidism of renal origin: Secondary | ICD-10-CM | POA: Diagnosis not present

## 2021-11-20 DIAGNOSIS — N186 End stage renal disease: Secondary | ICD-10-CM | POA: Diagnosis not present

## 2021-11-20 DIAGNOSIS — D689 Coagulation defect, unspecified: Secondary | ICD-10-CM | POA: Diagnosis not present

## 2021-11-20 DIAGNOSIS — R52 Pain, unspecified: Secondary | ICD-10-CM | POA: Diagnosis not present

## 2021-11-20 DIAGNOSIS — T8249XA Other complication of vascular dialysis catheter, initial encounter: Secondary | ICD-10-CM | POA: Diagnosis not present

## 2021-11-20 DIAGNOSIS — L299 Pruritus, unspecified: Secondary | ICD-10-CM | POA: Diagnosis not present

## 2021-11-20 DIAGNOSIS — L298 Other pruritus: Secondary | ICD-10-CM | POA: Diagnosis not present

## 2021-11-20 DIAGNOSIS — T8249XS Other complication of vascular dialysis catheter, sequela: Secondary | ICD-10-CM | POA: Diagnosis not present

## 2021-11-20 DIAGNOSIS — Z992 Dependence on renal dialysis: Secondary | ICD-10-CM | POA: Diagnosis not present

## 2021-11-22 DIAGNOSIS — N186 End stage renal disease: Secondary | ICD-10-CM | POA: Diagnosis not present

## 2021-11-22 DIAGNOSIS — R52 Pain, unspecified: Secondary | ICD-10-CM | POA: Diagnosis not present

## 2021-11-22 DIAGNOSIS — T8249XS Other complication of vascular dialysis catheter, sequela: Secondary | ICD-10-CM | POA: Diagnosis not present

## 2021-11-22 DIAGNOSIS — L299 Pruritus, unspecified: Secondary | ICD-10-CM | POA: Diagnosis not present

## 2021-11-22 DIAGNOSIS — N2581 Secondary hyperparathyroidism of renal origin: Secondary | ICD-10-CM | POA: Diagnosis not present

## 2021-11-22 DIAGNOSIS — L298 Other pruritus: Secondary | ICD-10-CM | POA: Diagnosis not present

## 2021-11-22 DIAGNOSIS — D689 Coagulation defect, unspecified: Secondary | ICD-10-CM | POA: Diagnosis not present

## 2021-11-22 DIAGNOSIS — T8249XA Other complication of vascular dialysis catheter, initial encounter: Secondary | ICD-10-CM | POA: Diagnosis not present

## 2021-11-22 DIAGNOSIS — Z992 Dependence on renal dialysis: Secondary | ICD-10-CM | POA: Diagnosis not present

## 2021-11-27 DIAGNOSIS — L299 Pruritus, unspecified: Secondary | ICD-10-CM | POA: Diagnosis not present

## 2021-11-27 DIAGNOSIS — Z992 Dependence on renal dialysis: Secondary | ICD-10-CM | POA: Diagnosis not present

## 2021-11-27 DIAGNOSIS — T8249XA Other complication of vascular dialysis catheter, initial encounter: Secondary | ICD-10-CM | POA: Diagnosis not present

## 2021-11-27 DIAGNOSIS — T8249XS Other complication of vascular dialysis catheter, sequela: Secondary | ICD-10-CM | POA: Diagnosis not present

## 2021-11-27 DIAGNOSIS — N2581 Secondary hyperparathyroidism of renal origin: Secondary | ICD-10-CM | POA: Diagnosis not present

## 2021-11-27 DIAGNOSIS — L298 Other pruritus: Secondary | ICD-10-CM | POA: Diagnosis not present

## 2021-11-27 DIAGNOSIS — D689 Coagulation defect, unspecified: Secondary | ICD-10-CM | POA: Diagnosis not present

## 2021-11-27 DIAGNOSIS — R52 Pain, unspecified: Secondary | ICD-10-CM | POA: Diagnosis not present

## 2021-11-27 DIAGNOSIS — N186 End stage renal disease: Secondary | ICD-10-CM | POA: Diagnosis not present

## 2021-11-30 ENCOUNTER — Encounter (HOSPITAL_COMMUNITY): Payer: Self-pay

## 2021-11-30 ENCOUNTER — Ambulatory Visit (HOSPITAL_COMMUNITY)
Admission: EM | Admit: 2021-11-30 | Discharge: 2021-11-30 | Disposition: A | Discharge status: Home / Self Care | Payer: Medicare Other | Attending: Physician Assistant | Admitting: Physician Assistant

## 2021-11-30 DIAGNOSIS — R112 Nausea with vomiting, unspecified: Secondary | ICD-10-CM

## 2021-11-30 DIAGNOSIS — R197 Diarrhea, unspecified: Secondary | ICD-10-CM | POA: Insufficient documentation

## 2021-11-30 DIAGNOSIS — K529 Noninfective gastroenteritis and colitis, unspecified: Secondary | ICD-10-CM

## 2021-11-30 LAB — COMPREHENSIVE METABOLIC PANEL
ALT: 9 U/L (ref 0–44)
AST: 14 U/L — ABNORMAL LOW (ref 15–41)
Albumin: 3.5 g/dL (ref 3.5–5.0)
Alkaline Phosphatase: 59 U/L (ref 38–126)
Anion gap: 17 — ABNORMAL HIGH (ref 5–15)
BUN: 54 mg/dL — ABNORMAL HIGH (ref 6–20)
CO2: 22 mmol/L (ref 22–32)
Calcium: 9.6 mg/dL (ref 8.9–10.3)
Chloride: 104 mmol/L (ref 98–111)
Creatinine, Ser: 12.6 mg/dL — ABNORMAL HIGH (ref 0.44–1.00)
GFR, Estimated: 3 mL/min — ABNORMAL LOW (ref >60)
Glucose, Bld: 97 mg/dL (ref 70–99)
Potassium: 3.8 mmol/L (ref 3.5–5.1)
Sodium: 143 mmol/L (ref 135–145)
Total Bilirubin: 0.5 mg/dL (ref 0.3–1.2)
Total Protein: 8.1 g/dL (ref 6.5–8.1)

## 2021-11-30 LAB — CBC WITH DIFFERENTIAL/PLATELET
Abs Immature Granulocytes: 0.04 10*3/uL (ref 0.00–0.07)
Basophils Absolute: 0 10*3/uL (ref 0.0–0.1)
Basophils Relative: 0 %
Eosinophils Absolute: 0.3 10*3/uL (ref 0.0–0.5)
Eosinophils Relative: 3 %
HCT: 32.9 % — ABNORMAL LOW (ref 36.0–46.0)
Hemoglobin: 10.7 g/dL — ABNORMAL LOW (ref 12.0–15.0)
Immature Granulocytes: 0 %
Lymphocytes Relative: 18 %
Lymphs Abs: 2 10*3/uL (ref 0.7–4.0)
MCH: 32 pg (ref 26.0–34.0)
MCHC: 32.5 g/dL (ref 30.0–36.0)
MCV: 98.5 fL (ref 80.0–100.0)
Monocytes Absolute: 0.7 10*3/uL (ref 0.1–1.0)
Monocytes Relative: 7 %
Neutro Abs: 7.9 10*3/uL — ABNORMAL HIGH (ref 1.7–7.7)
Neutrophils Relative %: 72 %
Platelets: 235 10*3/uL (ref 150–400)
RBC: 3.34 MIL/uL — ABNORMAL LOW (ref 3.87–5.11)
RDW: 14.3 % (ref 11.5–15.5)
WBC: 11 10*3/uL — ABNORMAL HIGH (ref 4.0–10.5)
nRBC: 0 % (ref 0.0–0.2)

## 2021-11-30 NOTE — ED Triage Notes (Signed)
Pt c/o upper abdominal pain with v/d x2 yesterday and this morning. States yesterday vomit up her [place] food she ate on Saturday. States pain goes away after vomiting.

## 2021-11-30 NOTE — Discharge Instructions (Signed)
I am glad you are feeling somewhat better and were able to eat and drink here without recurrent nausea/vomiting.  It is very important that you drink plenty of fluid and eat a bland diet.  We will contact you if your lab work is abnormal we need to change treatment plan.  Please make sure you attend dialysis tomorrow.  If you have any worsening symptoms including recurrent abdominal pain, nausea/vomiting interfering with oral intake, fever, confusion, weakness blood in your stool, blood in your vomit you need to go to the emergency room.

## 2021-11-30 NOTE — ED Provider Notes (Signed)
[facility]    CSN: [id number] Arrival date & time: 11/30/21  1042      History   Chief Complaint Chief Complaint  Patient presents with   Emesis    HPI [name] is a 46 y.o. female.   Patient presents today with a 2-day history of nausea, vomiting, diarrhea.  She does report central abdominal pain which is rated 5 out of 0 to pain scale, described as cramping, no aggravating or alleviating factors identified.  She does report eating out several times in the past several days but denies any suspicious food intake.  Denies any known sick contacts, medication changes, antibiotic use.  She denies history of gastrointestinal disorder including ulcerative colitis or Crohn's disease.  She does have a history of end-stage renal disease on dialysis but has been compliant with dialysis.  She is due today but has already scheduled for make-up session tomorrow as she wanted to get checked out for the abdominal pain/GI symptoms.  She has tried [employer] back and body as well as Pepto-Bismol with only temporary relief of symptoms.  She denies any melena, hematochezia, hematemesis.  Reports decreased oral intake as result of nausea and vomiting.  Denies previous abdominal surgery.  She is feeling better after she took a nap earlier today but has been afraid to try to eat.    Past Medical History:  Diagnosis Date   Anemia    Anxiety    pt not aware of this   Arthritis    Breast mass 09/01/2016   Targeted ultrasound is performed, showing a mass in the left breast at 11 o'clock, 1 cm from the nipple measuring 6.4 by 5.0 cm. The mass is heterogeneous and may contain macroscopic fat.   IMPRESSION: The mass in the left breast is probably benign, possibly a Hamartoma.  RECOMMENDATION: Recommend six-month follow-up mammogram of the probably benign left breast mass. If necessary, an ultrasound co   CHF (congestive heart failure) ([place])    hosp. 2011   Chronic kidney disease    Mon Wed  Friday   Dilated cardiomyopathy ([place])    Encounter for blood transfusion    ESRD (end stage renal disease) ([place])    MWF - [place]   Family history of adverse reaction to anesthesia     " Aunt has a tracheostomy and kept coughing after colonoscopy "   Foot fracture    Gout    no flare up for several yrs   Headache    Heart murmur    as a child, no problems as an adult   Hemodialysis patient ([place])    History of blood transfusion    x 2   History of pneumonia    Hyperlipidemia    Hypertension    Menorrhagia    Morbid obesity ([place])    Pneumonia    hosp. 2010   Shortness of breath    HX CHL - occasional. 04/09/21 patient sits down and shortness of breath susides.   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   Thyroid disease     Patient Active Problem List   Diagnosis Date Noted   ESRD (end stage renal disease) on dialysis ([place]) 10/21/2020   Anemia in chronic kidney disease (CKD) 07/31/2015   Menorrhagia with irregular cycle 02/27/2014   Essential hypertension 02/27/2014   Menorrhagia 01/27/2013  HYPERLIPIDEMIA-MIXED 04/20/2008   OBESITY-MORBID (>100') 04/20/2008   MITRAL REGURGITATION 04/20/2008   CARDIOMYOPATHY, DILATED 04/20/2008    Past Surgical History:  Procedure Laterality Date   A/V FISTULAGRAM Left 08/13/2021   Procedure: A/V Fistulagram;  Surgeon: Marty Heck, MD;  Location: [facility];  Service: Cardiovascular;  Laterality: Left;   ANGIOPLASTY  07/28/2015   Procedure: ANGIOPLASTY OF RIGHT BRACHIAL VEIN;  Surgeon: Conrad Hazelton, MD;  Location: [facility];  Service: Vascular;;   AV FISTULA PLACEMENT  08/04/2011   Procedure: ARTERIOVENOUS (AV) FISTULA CREATION;  Surgeon: Elam Dutch, MD;  Location: [facility];  Service: Vascular;  Laterality: Right;  Creation right brachiocephalic arteriovenous fistula   AV FISTULA  PLACEMENT Left 02/05/2013   Procedure: ARTERIOVENOUS FISTULA CREATION LEFT ARM;  Surgeon: Conrad Ranson, MD;  Location: [facility];  Service: Vascular;  Laterality: Left;   AV FISTULA PLACEMENT Right 06/04/2013   Procedure: ARTERIOVENOUS (AV) FISTULA CREATION;  Surgeon: Conrad Ferndale, MD;  Location: [facility];  Service: Vascular;  Laterality: Right;   AV FISTULA PLACEMENT Right 04/15/2014   Procedure: INSERTION OF ARTERIOVENOUS (AV) GORE-TEX GRAFT ARM;  Surgeon: Conrad Patmos, MD;  Location: [facility];  Service: Vascular;  Laterality: Right;   AV FISTULA PLACEMENT Left 02/25/2021   Procedure: LEFT ARM ARTERIOVENOUS (AV) FISTULA CREATION;  Surgeon: Marty Heck, MD;  Location: [facility];  Service: Vascular;  Laterality: Left;   AV FISTULA PLACEMENT Left 08/26/2021   Procedure: LEFT ARM LIGATION OF BRACHIOBASILIC FISTULA,  REPAIR PSEUDOANEURYSM;  Surgeon: Marty Heck, MD;  Location: [facility] [place];  Service: Vascular;  Laterality: Left;   AV FISTULA PLACEMENT Left 10/12/2021   Procedure: LEFT RADIOCEPHALIC ARTERIOVENOUS FISTULA  ARTERIOVENOUS (AV) FISTULA CREATION WITH INTERPOSITION GRAFT;  Surgeon: Marty Heck, MD;  Location: [facility];  Service: Vascular;  Laterality: Left;   [place] Right 08/13/2013   Procedure: Second Stage Brachial Vein Transposition;  Surgeon: Conrad [place], MD;  Location: [facility];  Service: Vascular;  Laterality: Right;   [place] Left 04/13/2021   Procedure: LEFT ARM SECOND STAGE BASILIC VEIN TRANSPOSITION;  Surgeon: Marty Heck, MD;  Location: [facility];  Service: Vascular;  Laterality: Left;   CARDIAC SURGERY  1979   repair of hole in heart   GRAFT APPLICATION Left 16/06/628   Procedure: ARTEGRAFT COLLAGEN VASCULAR GRAFT APPLICATION;  Surgeon: Marty Heck, MD;  Location: [place] [facility];  Service: Vascular;  Laterality: Left;   INCISION AND DRAINAGE OF WOUND Right 07/28/2015   Procedure: IRRIGATION AND DEBRIDEMENT RIGHT HAND  WOUND;  Surgeon:  Dayna Barker, MD;  Location: [facility];  Service: Plastics;  Laterality: Right;   LIGATION OF ARTERIOVENOUS  FISTULA Left 03/05/2013   Procedure: LIGATION OF ARTERIOVENOUS  FISTULA;  Surgeon: Conrad Lakeside, MD;  Location: [facility];  Service: Vascular;  Laterality: Left;   PATCH ANGIOPLASTY Left 03/05/2013   Procedure: PATCH ANGIOPLASTY;  Surgeon: Conrad [place], MD;  Location: [facility];  Service: Vascular;  Laterality: Left;   PERIPHERAL VASCULAR BALLOON ANGIOPLASTY Left 08/13/2021   Procedure: PERIPHERAL VASCULAR BALLOON ANGIOPLASTY;  Surgeon: Marty Heck, MD;  Location: [facility];  Service: Cardiovascular;  Laterality: Left;  arm fistula   PERIPHERAL VASCULAR CATHETERIZATION Right 10/31/2014   Procedure: Fistulagram;  Surgeon: Conrad Cordele, MD;  Location: [facility];  Service: Cardiovascular;  Laterality: Right;   PERIPHERAL VASCULAR CATHETERIZATION Right 10/31/2014   Procedure: Peripheral Vascular Balloon Angioplasty;  Surgeon: Conrad Atwood, MD;  Location: [facility];  Service: Cardiovascular;  Laterality: Right;  pta venous rt arm   PERIPHERAL VASCULAR CATHETERIZATION N/A 03/13/2015   Procedure: A/V Shuntogram;  Surgeon: Conrad [place], MD;  Location: [facility];  Service: Cardiovascular;  Laterality: N/A;   PERIPHERAL VASCULAR CATHETERIZATION Right 03/13/2015   Procedure: Peripheral Vascular Balloon Angioplasty;  Surgeon: Conrad Graham, MD;  Location: [facility];  Service: Cardiovascular;  Laterality: Right;  Shunt   PERIPHERAL VASCULAR CATHETERIZATION Right 01/19/2016   Procedure: A/V Shuntogram;  Surgeon: Angelia Mould, MD;  Location: [facility];  Service: Cardiovascular;  Laterality: Right;   REVISION OF ARTERIOVENOUS GORETEX GRAFT Right 10/27/2015   Procedure: THROMBECTOMY AND REVISION OF RIGHT UPPER ARM  ARTERIOVENOUS GORETEX GRAFT USING 6MM X 10CM GORE-TEX GRAFT;  Surgeon: Elam Dutch, MD;  Location: [facility];  Service: Vascular;  Laterality:  Right;   REVISION OF ARTERIOVENOUS GORETEX GRAFT Right 01/30/2016   Procedure: REVISION OF RIGHT UPPER ARM ARTERIOVENOUS USING GORETEX GRAFT;  Surgeon: Angelia Mould, MD;  Location: [facility];  Service: Vascular;  Laterality: Right;   THROMBECTOMY W/ EMBOLECTOMY Left 03/05/2013   Procedure: THROMBECTOMY OF LEFT BRACHIAL ARTERY;  Surgeon: Conrad [place], MD;  Location: [facility];  Service: Vascular;  Laterality: Left;   VENOGRAM Bilateral 03/25/2014   Procedure: VENOGRAM bilateral;  Surgeon: Conrad Venersborg, MD;  Location: [facility];  Service: Cardiovascular;  Laterality: Bilateral;    OB History   No obstetric history on file.      Home Medications    Prior to Admission medications   Medication Sig Start Date End Date Taking? Authorizing Provider  Aspirin-Caffeine (BAYER BACK & BODY) 500-32.5 MG TABS Take 2 tablets by mouth 2 (two) times daily as needed (pain.).    [provider]  AURYXIA 1 GM 210 MG(Fe) tablet Take 630 mg by mouth See admin instructions. Take 3 tablets (630 mg) by mouth with meals and snacks 01/19/21   [provider]  cinacalcet (SENSIPAR) 60 MG tablet Take 60 mg by mouth in the morning.    [provider]  megestrol (MEGACE) 20 MG tablet Take 1 tablet (20 mg total) by mouth daily. 11/04/21   Donnamae Jude, MD  PROAIR HFA 108 [phone] Base) MCG/ACT inhaler Inhale 1-2 puffs into the lungs 3 (three) times daily as needed for wheezing or shortness of breath. For wheezing. 09/04/15   [provider]    Family History Family History  Problem Relation Age of Onset   Diabetes Mother    Hyperlipidemia Mother    Hypertension Mother    Kidney disease Mother    Anesthesia problems Neg Hx    Hypotension Neg Hx    Malignant hyperthermia Neg Hx    Pseudochol deficiency Neg Hx     Social History Social History   Tobacco Use   Smoking status: Never    Passive exposure: Never   Smokeless tobacco: Never  Vaping Use   Vaping Use: Never used   Substance Use Topics   Alcohol use: No    Alcohol/week: 0.0 standard drinks of alcohol   Drug use: No     Allergies   Pollen extract and Oxycodone-acetaminophen   Review of Systems Review of Systems  Constitutional:  Positive for activity change. Negative for appetite change, fatigue and fever.  Respiratory:  Negative for cough and shortness of breath.   Cardiovascular:  Negative for chest  pain.  Gastrointestinal:  Positive for abdominal pain, diarrhea, nausea and vomiting. Negative for blood in stool.  Musculoskeletal:  Negative for arthralgias and myalgias.  Neurological:  Negative for dizziness, light-headedness and headaches.     Physical Exam Triage Vital Signs ED Triage Vitals  Enc Vitals Group     BP 11/30/21 1204 (!) 165/103     Pulse Rate 11/30/21 1201 86     Resp 11/30/21 1201 18     Temp 11/30/21 1201 98.9 F (37.2 C)     Temp Source 11/30/21 1201 Oral     SpO2 11/30/21 1201 99 %     Weight --      Height --      Head Circumference --      Peak Flow --      Pain Score 11/30/21 1202 5     Pain Loc --      Pain Edu? --      Excl. in [place]? --    No data found.  Updated Vital Signs BP (!) 165/103 (BP Location: Right Arm)   Pulse 86   Temp 98.9 F (37.2 C) (Oral)   Resp 18   SpO2 99%   Visual Acuity Right Eye Distance:   Left Eye Distance:   Bilateral Distance:    Right Eye Near:   Left Eye Near:    Bilateral Near:     Physical Exam Vitals reviewed.  Constitutional:      General: She is awake. She is not in acute distress.    Appearance: Normal appearance. She is well-developed. She is not ill-appearing.     Comments: Very pleasant female appears stated age in no acute distress sitting comfortably in exam room  HENT:     Head: Normocephalic and atraumatic.     Mouth/Throat:     Mouth: Mucous membranes are moist.     Pharynx: Uvula midline. No oropharyngeal exudate or posterior oropharyngeal erythema.  Cardiovascular:     Rate and Rhythm:  Normal rate and regular rhythm.     Heart sounds: Normal heart sounds, S1 normal and S2 normal. No murmur heard. Pulmonary:     Effort: Pulmonary effort is normal.     Breath sounds: Normal breath sounds. No wheezing, rhonchi or rales.     Comments: Clear to auscultation bilaterally Abdominal:     General: Bowel sounds are normal.     Palpations: Abdomen is soft.     Tenderness: There is no abdominal tenderness. There is no right CVA tenderness, left CVA tenderness, guarding or rebound.     Comments: Benign abdominal exam.  No significant tenderness on exam.  Psychiatric:        Behavior: Behavior is cooperative.      UC Treatments / Results  Labs (all labs ordered are listed, but only abnormal results are displayed) Labs Reviewed  CBC WITH DIFFERENTIAL/PLATELET  COMPREHENSIVE METABOLIC PANEL    EKG   Radiology No results found.  Procedures Procedures (including critical care time)  Medications Ordered in UC Medications - No data to display  Initial Impression / Assessment and Plan / UC Course  I have reviewed the triage vital signs and the nursing notes.  Pertinent labs & imaging results that were available during my care of the patient were reviewed by me and considered in my medical decision making (see chart for details).     Patient is well-appearing, afebrile, nontoxic, nontachycardic.  Vital signs and physical exam are reassuring today with no indication for  emergent evaluation or imaging.  Patient was able to pass oral challenge and eat several graham crackers as well as drink glass of water without recurrent nausea/vomiting.  She was encouraged to rest and drink plenty of fluid up to the maximum recommended by her nephrologist.  Basic labs including CBC and CMP were obtained today.  If she has significant electrolyte abnormality or leukocytosis she will need to go to the emergency room.  She is scheduled to have dialysis tomorrow as a make-up session and we  discussed the importance of attending this.  If she has any changing or worsening symptoms in the next 24 hours before her dialysis she needs to go to the emergency room including abdominal pain, nausea/vomiting interfere with oral intake, changes in bowel habits, melena, hematochezia, hematemesis, weakness, muscle pain or confusion.  Strict return precautions given.  Case discussed with Dr. Windy Carina who agreed with treatment plan.  Final Clinical Impressions(s) / UC Diagnoses   Final diagnoses:  Nausea vomiting and diarrhea  Gastroenteritis     Discharge Instructions      I am glad you are feeling somewhat better and were able to eat and drink here without recurrent nausea/vomiting.  It is very important that you drink plenty of fluid and eat a bland diet.  We will contact you if your lab work is abnormal we need to change treatment plan.  Please make sure you attend dialysis tomorrow.  If you have any worsening symptoms including recurrent abdominal pain, nausea/vomiting interfering with oral intake, fever, confusion, weakness blood in your stool, blood in your vomit you need to go to the emergency room.     ED Prescriptions   None    PDMP not reviewed this encounter.   Terrilee Croak, PA-C 11/30/21 1317

## 2021-12-02 DIAGNOSIS — T8249XS Other complication of vascular dialysis catheter, sequela: Secondary | ICD-10-CM | POA: Diagnosis not present

## 2021-12-02 DIAGNOSIS — L298 Other pruritus: Secondary | ICD-10-CM | POA: Diagnosis not present

## 2021-12-02 DIAGNOSIS — T8249XA Other complication of vascular dialysis catheter, initial encounter: Secondary | ICD-10-CM | POA: Diagnosis not present

## 2021-12-02 DIAGNOSIS — Z992 Dependence on renal dialysis: Secondary | ICD-10-CM | POA: Diagnosis not present

## 2021-12-02 DIAGNOSIS — N186 End stage renal disease: Secondary | ICD-10-CM | POA: Diagnosis not present

## 2021-12-02 DIAGNOSIS — D689 Coagulation defect, unspecified: Secondary | ICD-10-CM | POA: Diagnosis not present

## 2021-12-02 DIAGNOSIS — L299 Pruritus, unspecified: Secondary | ICD-10-CM | POA: Diagnosis not present

## 2021-12-02 DIAGNOSIS — N2581 Secondary hyperparathyroidism of renal origin: Secondary | ICD-10-CM | POA: Diagnosis not present

## 2021-12-02 DIAGNOSIS — R52 Pain, unspecified: Secondary | ICD-10-CM | POA: Diagnosis not present

## 2021-12-03 DIAGNOSIS — N041 Nephrotic syndrome with focal and segmental glomerular lesions: Secondary | ICD-10-CM | POA: Diagnosis not present

## 2021-12-03 DIAGNOSIS — Z992 Dependence on renal dialysis: Secondary | ICD-10-CM | POA: Diagnosis not present

## 2021-12-03 DIAGNOSIS — N186 End stage renal disease: Secondary | ICD-10-CM | POA: Diagnosis not present

## 2021-12-04 DIAGNOSIS — D689 Coagulation defect, unspecified: Secondary | ICD-10-CM | POA: Diagnosis not present

## 2021-12-04 DIAGNOSIS — L299 Pruritus, unspecified: Secondary | ICD-10-CM | POA: Diagnosis not present

## 2021-12-04 DIAGNOSIS — N186 End stage renal disease: Secondary | ICD-10-CM | POA: Diagnosis not present

## 2021-12-04 DIAGNOSIS — L298 Other pruritus: Secondary | ICD-10-CM | POA: Diagnosis not present

## 2021-12-04 DIAGNOSIS — Z992 Dependence on renal dialysis: Secondary | ICD-10-CM | POA: Diagnosis not present

## 2021-12-04 DIAGNOSIS — N2581 Secondary hyperparathyroidism of renal origin: Secondary | ICD-10-CM | POA: Diagnosis not present

## 2021-12-04 DIAGNOSIS — T8249XA Other complication of vascular dialysis catheter, initial encounter: Secondary | ICD-10-CM | POA: Diagnosis not present

## 2021-12-04 DIAGNOSIS — T8249XS Other complication of vascular dialysis catheter, sequela: Secondary | ICD-10-CM | POA: Diagnosis not present

## 2021-12-07 DIAGNOSIS — T8249XS Other complication of vascular dialysis catheter, sequela: Secondary | ICD-10-CM | POA: Diagnosis not present

## 2021-12-07 DIAGNOSIS — N186 End stage renal disease: Secondary | ICD-10-CM | POA: Diagnosis not present

## 2021-12-07 DIAGNOSIS — Z992 Dependence on renal dialysis: Secondary | ICD-10-CM | POA: Diagnosis not present

## 2021-12-07 DIAGNOSIS — N2581 Secondary hyperparathyroidism of renal origin: Secondary | ICD-10-CM | POA: Diagnosis not present

## 2021-12-07 DIAGNOSIS — D689 Coagulation defect, unspecified: Secondary | ICD-10-CM | POA: Diagnosis not present

## 2021-12-07 DIAGNOSIS — T8249XA Other complication of vascular dialysis catheter, initial encounter: Secondary | ICD-10-CM | POA: Diagnosis not present

## 2021-12-07 DIAGNOSIS — L298 Other pruritus: Secondary | ICD-10-CM | POA: Diagnosis not present

## 2021-12-07 DIAGNOSIS — L299 Pruritus, unspecified: Secondary | ICD-10-CM | POA: Diagnosis not present

## 2021-12-11 DIAGNOSIS — T8249XA Other complication of vascular dialysis catheter, initial encounter: Secondary | ICD-10-CM | POA: Diagnosis not present

## 2021-12-11 DIAGNOSIS — L298 Other pruritus: Secondary | ICD-10-CM | POA: Diagnosis not present

## 2021-12-11 DIAGNOSIS — L299 Pruritus, unspecified: Secondary | ICD-10-CM | POA: Diagnosis not present

## 2021-12-11 DIAGNOSIS — Z992 Dependence on renal dialysis: Secondary | ICD-10-CM | POA: Diagnosis not present

## 2021-12-11 DIAGNOSIS — D689 Coagulation defect, unspecified: Secondary | ICD-10-CM | POA: Diagnosis not present

## 2021-12-11 DIAGNOSIS — N186 End stage renal disease: Secondary | ICD-10-CM | POA: Diagnosis not present

## 2021-12-11 DIAGNOSIS — N2581 Secondary hyperparathyroidism of renal origin: Secondary | ICD-10-CM | POA: Diagnosis not present

## 2021-12-11 DIAGNOSIS — T8249XS Other complication of vascular dialysis catheter, sequela: Secondary | ICD-10-CM | POA: Diagnosis not present

## 2021-12-15 ENCOUNTER — Ambulatory Visit: Payer: Medicare Other | Admitting: Medical

## 2021-12-16 DIAGNOSIS — L298 Other pruritus: Secondary | ICD-10-CM | POA: Diagnosis not present

## 2021-12-16 DIAGNOSIS — N186 End stage renal disease: Secondary | ICD-10-CM | POA: Diagnosis not present

## 2021-12-16 DIAGNOSIS — T8249XA Other complication of vascular dialysis catheter, initial encounter: Secondary | ICD-10-CM | POA: Diagnosis not present

## 2021-12-16 DIAGNOSIS — N2581 Secondary hyperparathyroidism of renal origin: Secondary | ICD-10-CM | POA: Diagnosis not present

## 2021-12-16 DIAGNOSIS — D689 Coagulation defect, unspecified: Secondary | ICD-10-CM | POA: Diagnosis not present

## 2021-12-16 DIAGNOSIS — L299 Pruritus, unspecified: Secondary | ICD-10-CM | POA: Diagnosis not present

## 2021-12-16 DIAGNOSIS — T8249XS Other complication of vascular dialysis catheter, sequela: Secondary | ICD-10-CM | POA: Diagnosis not present

## 2021-12-16 DIAGNOSIS — Z992 Dependence on renal dialysis: Secondary | ICD-10-CM | POA: Diagnosis not present

## 2021-12-18 DIAGNOSIS — D689 Coagulation defect, unspecified: Secondary | ICD-10-CM | POA: Diagnosis not present

## 2021-12-18 DIAGNOSIS — Z992 Dependence on renal dialysis: Secondary | ICD-10-CM | POA: Diagnosis not present

## 2021-12-18 DIAGNOSIS — N2581 Secondary hyperparathyroidism of renal origin: Secondary | ICD-10-CM | POA: Diagnosis not present

## 2021-12-18 DIAGNOSIS — N186 End stage renal disease: Secondary | ICD-10-CM | POA: Diagnosis not present

## 2021-12-18 DIAGNOSIS — L298 Other pruritus: Secondary | ICD-10-CM | POA: Diagnosis not present

## 2021-12-18 DIAGNOSIS — L299 Pruritus, unspecified: Secondary | ICD-10-CM | POA: Diagnosis not present

## 2021-12-18 DIAGNOSIS — T8249XA Other complication of vascular dialysis catheter, initial encounter: Secondary | ICD-10-CM | POA: Diagnosis not present

## 2021-12-18 DIAGNOSIS — T8249XS Other complication of vascular dialysis catheter, sequela: Secondary | ICD-10-CM | POA: Diagnosis not present

## 2021-12-21 DIAGNOSIS — N2581 Secondary hyperparathyroidism of renal origin: Secondary | ICD-10-CM | POA: Diagnosis not present

## 2021-12-21 DIAGNOSIS — L299 Pruritus, unspecified: Secondary | ICD-10-CM | POA: Diagnosis not present

## 2021-12-21 DIAGNOSIS — T8249XS Other complication of vascular dialysis catheter, sequela: Secondary | ICD-10-CM | POA: Diagnosis not present

## 2021-12-21 DIAGNOSIS — N186 End stage renal disease: Secondary | ICD-10-CM | POA: Diagnosis not present

## 2021-12-21 DIAGNOSIS — D689 Coagulation defect, unspecified: Secondary | ICD-10-CM | POA: Diagnosis not present

## 2021-12-21 DIAGNOSIS — Z992 Dependence on renal dialysis: Secondary | ICD-10-CM | POA: Diagnosis not present

## 2021-12-21 DIAGNOSIS — L298 Other pruritus: Secondary | ICD-10-CM | POA: Diagnosis not present

## 2021-12-21 DIAGNOSIS — T8249XA Other complication of vascular dialysis catheter, initial encounter: Secondary | ICD-10-CM | POA: Diagnosis not present

## 2021-12-25 DIAGNOSIS — D689 Coagulation defect, unspecified: Secondary | ICD-10-CM | POA: Diagnosis not present

## 2021-12-25 DIAGNOSIS — T8249XA Other complication of vascular dialysis catheter, initial encounter: Secondary | ICD-10-CM | POA: Diagnosis not present

## 2021-12-25 DIAGNOSIS — L298 Other pruritus: Secondary | ICD-10-CM | POA: Diagnosis not present

## 2021-12-25 DIAGNOSIS — N2581 Secondary hyperparathyroidism of renal origin: Secondary | ICD-10-CM | POA: Diagnosis not present

## 2021-12-25 DIAGNOSIS — N186 End stage renal disease: Secondary | ICD-10-CM | POA: Diagnosis not present

## 2021-12-25 DIAGNOSIS — Z992 Dependence on renal dialysis: Secondary | ICD-10-CM | POA: Diagnosis not present

## 2021-12-25 DIAGNOSIS — L299 Pruritus, unspecified: Secondary | ICD-10-CM | POA: Diagnosis not present

## 2021-12-25 DIAGNOSIS — T8249XS Other complication of vascular dialysis catheter, sequela: Secondary | ICD-10-CM | POA: Diagnosis not present

## 2021-12-30 DIAGNOSIS — N186 End stage renal disease: Secondary | ICD-10-CM | POA: Diagnosis not present

## 2021-12-30 DIAGNOSIS — Z992 Dependence on renal dialysis: Secondary | ICD-10-CM | POA: Diagnosis not present

## 2021-12-30 DIAGNOSIS — L298 Other pruritus: Secondary | ICD-10-CM | POA: Diagnosis not present

## 2021-12-30 DIAGNOSIS — T8249XS Other complication of vascular dialysis catheter, sequela: Secondary | ICD-10-CM | POA: Diagnosis not present

## 2021-12-30 DIAGNOSIS — L299 Pruritus, unspecified: Secondary | ICD-10-CM | POA: Diagnosis not present

## 2021-12-30 DIAGNOSIS — T8249XA Other complication of vascular dialysis catheter, initial encounter: Secondary | ICD-10-CM | POA: Diagnosis not present

## 2021-12-30 DIAGNOSIS — N2581 Secondary hyperparathyroidism of renal origin: Secondary | ICD-10-CM | POA: Diagnosis not present

## 2021-12-30 DIAGNOSIS — D689 Coagulation defect, unspecified: Secondary | ICD-10-CM | POA: Diagnosis not present

## 2022-01-01 ENCOUNTER — Ambulatory Visit: Payer: Medicare Other | Admitting: Obstetrics and Gynecology

## 2022-01-03 DIAGNOSIS — N186 End stage renal disease: Secondary | ICD-10-CM | POA: Diagnosis not present

## 2022-01-03 DIAGNOSIS — Z992 Dependence on renal dialysis: Secondary | ICD-10-CM | POA: Diagnosis not present

## 2022-01-03 DIAGNOSIS — N041 Nephrotic syndrome with focal and segmental glomerular lesions: Secondary | ICD-10-CM | POA: Diagnosis not present

## 2022-01-08 ENCOUNTER — Emergency Department (HOSPITAL_COMMUNITY): Payer: 59

## 2022-01-08 ENCOUNTER — Emergency Department (HOSPITAL_COMMUNITY)
Admission: EM | Admit: 2022-01-08 | Discharge: 2022-01-08 | Disposition: A | Discharge status: Home / Self Care | Payer: 59 | Attending: Emergency Medicine | Admitting: Emergency Medicine

## 2022-01-08 ENCOUNTER — Other Ambulatory Visit: Payer: Self-pay

## 2022-01-08 DIAGNOSIS — Z992 Dependence on renal dialysis: Secondary | ICD-10-CM

## 2022-01-08 DIAGNOSIS — K219 Gastro-esophageal reflux disease without esophagitis: Secondary | ICD-10-CM

## 2022-01-08 DIAGNOSIS — I509 Heart failure, unspecified: Secondary | ICD-10-CM | POA: Insufficient documentation

## 2022-01-08 DIAGNOSIS — R109 Unspecified abdominal pain: Secondary | ICD-10-CM | POA: Diagnosis present

## 2022-01-08 DIAGNOSIS — N186 End stage renal disease: Secondary | ICD-10-CM | POA: Insufficient documentation

## 2022-01-08 DIAGNOSIS — K8020 Calculus of gallbladder without cholecystitis without obstruction: Secondary | ICD-10-CM | POA: Diagnosis not present

## 2022-01-08 DIAGNOSIS — I132 Hypertensive heart and chronic kidney disease with heart failure and with stage 5 chronic kidney disease, or end stage renal disease: Secondary | ICD-10-CM | POA: Diagnosis not present

## 2022-01-08 LAB — CBC WITH DIFFERENTIAL/PLATELET
Abs Immature Granulocytes: 0.06 10*3/uL (ref 0.00–0.07)
Basophils Absolute: 0 10*3/uL (ref 0.0–0.1)
Basophils Relative: 0 %
Eosinophils Absolute: 0.8 10*3/uL — ABNORMAL HIGH (ref 0.0–0.5)
Eosinophils Relative: 6 %
HCT: 33.5 % — ABNORMAL LOW (ref 36.0–46.0)
Hemoglobin: 11.7 g/dL — ABNORMAL LOW (ref 12.0–15.0)
Immature Granulocytes: 0 %
Lymphocytes Relative: 18 %
Lymphs Abs: 2.4 10*3/uL (ref 0.7–4.0)
MCH: 33.3 pg (ref 26.0–34.0)
MCHC: 34.9 g/dL (ref 30.0–36.0)
MCV: 95.4 fL (ref 80.0–100.0)
Monocytes Absolute: 0.7 10*3/uL (ref 0.1–1.0)
Monocytes Relative: 5 %
Neutro Abs: 9.6 10*3/uL — ABNORMAL HIGH (ref 1.7–7.7)
Neutrophils Relative %: 71 %
Platelets: 229 10*3/uL (ref 150–400)
RBC: 3.51 MIL/uL — ABNORMAL LOW (ref 3.87–5.11)
RDW: 14.1 % (ref 11.5–15.5)
WBC: 13.6 10*3/uL — ABNORMAL HIGH (ref 4.0–10.5)
nRBC: 0 % (ref 0.0–0.2)

## 2022-01-08 LAB — COMPREHENSIVE METABOLIC PANEL
ALT: 10 U/L (ref 0–44)
AST: 17 U/L (ref 15–41)
Albumin: 3.5 g/dL (ref 3.5–5.0)
Alkaline Phosphatase: 44 U/L (ref 38–126)
Anion gap: 16 — ABNORMAL HIGH (ref 5–15)
BUN: 39 mg/dL — ABNORMAL HIGH (ref 6–20)
CO2: 24 mmol/L (ref 22–32)
Calcium: 9.3 mg/dL (ref 8.9–10.3)
Chloride: 97 mmol/L — ABNORMAL LOW (ref 98–111)
Creatinine, Ser: 10.37 mg/dL — ABNORMAL HIGH (ref 0.44–1.00)
GFR, Estimated: 4 mL/min — ABNORMAL LOW (ref >60)
Glucose, Bld: 119 mg/dL — ABNORMAL HIGH (ref 70–99)
Potassium: 3.6 mmol/L (ref 3.5–5.1)
Sodium: 137 mmol/L (ref 135–145)
Total Bilirubin: 0.5 mg/dL (ref 0.3–1.2)
Total Protein: 7.6 g/dL (ref 6.5–8.1)

## 2022-01-08 LAB — LIPASE, BLOOD: Lipase: 56 U/L — ABNORMAL HIGH (ref 11–51)

## 2022-01-08 MED ORDER — ALUM & MAG HYDROXIDE-SIMETH 200-200-20 MG/5ML PO SUSP
30.0000 mL | Freq: Once | ORAL | Status: AC
  Administered 2022-01-08: 30 mL via ORAL
  Filled 2022-01-08: qty 30

## 2022-01-08 MED ORDER — PANTOPRAZOLE SODIUM 40 MG PO TBEC: 40.0000 mg | DELAYED_RELEASE_TABLET | Freq: Every day | ORAL | 0 refills | Status: DC

## 2022-01-08 MED ORDER — PANTOPRAZOLE SODIUM 40 MG PO TBEC
40.0000 mg | DELAYED_RELEASE_TABLET | Freq: Once | ORAL | Status: AC
  Administered 2022-01-08: 40 mg via ORAL
  Filled 2022-01-08: qty 1

## 2022-01-08 NOTE — ED Provider Triage Note (Signed)
Emergency Medicine Provider Triage Evaluation Note  [name] , a 47 y.o. female  was evaluated in triage.  Pt complains of abdominal pain and diarrhea.  Ongoing for a few days now.  Similar a few weeks ago that self resolved but now back again. Denies vomiting.  Hx ESRD on HD, last treatment Wednesday as scheduled, had full session.  Review of Systems  Positive: Abdominal pain, diarrhea Negative: fever  Physical Exam  BP (!) 158/104 (BP Location: Right Arm)   Pulse (!) 102   Temp 98.4 F (36.9 C) (Oral)   Resp (!) 28   SpO2 100%   Gen:   Awake, no distress   Resp:  Normal effort  MSK:   Moves extremities without difficulty  Other:  Dialysis catheter right chest wall  Medical Decision Making  Medically screening exam initiated at 2:02 AM.  Appropriate orders placed.  [name] was informed that the remainder of the evaluation will be completed by another provider, this initial triage assessment does not replace that evaluation, and the importance of remaining in the ED until their evaluation is complete.  Abdominal pain, diarrhea.  Hx ESRD, on HD.  Full treatment Wednesday.  Labs ordered.   Larene Pickett, PA-C 01/08/22 [phone]

## 2022-01-08 NOTE — ED Provider Notes (Signed)
[facility] EMERGENCY DEPARTMENT Provider Note   CSN: [id number] Arrival date & time: 01/08/22  0147     History  Chief Complaint  Patient presents with   Abdominal Pain   Diarrhea    [name] is a 47 y.o. female.   Abdominal Pain Associated symptoms: diarrhea   Diarrhea Associated symptoms: abdominal pain      Patient has a history of hyper tension and hyperlipidemia, congestive heart disease, dilated cardiomyopathy, elevated BMI, and chronic kidney disease on dialysis.  Patient last went to dialysis on Wednesday.  Patient presents to the ED because she is having upper abdominal discomfort.  Patient states she has been having a lot of burping and belching.  She has had some discomfort in her upper abdomen in the epigastric region.  She has not had any fevers.  She has not had any vomiting.  She has had some loose stools.  She denies any pain in her lower abdomen.  No prior history of gallbladder disease or pancreas disease.  She denies any gallbladder surgery.  Home Medications Prior to Admission medications   Medication Sig Start Date End Date Taking? Authorizing Provider  pantoprazole (PROTONIX) 40 MG tablet Take 1 tablet (40 mg total) by mouth daily. 01/08/22  Yes Dorie Rank, MD  Aspirin-Caffeine (BAYER BACK & BODY) 500-32.5 MG TABS Take 2 tablets by mouth 2 (two) times daily as needed (pain.).    [provider]  AURYXIA 1 GM 210 MG(Fe) tablet Take 630 mg by mouth See admin instructions. Take 3 tablets (630 mg) by mouth with meals and snacks 01/19/21   [provider]  cinacalcet (SENSIPAR) 60 MG tablet Take 60 mg by mouth in the morning.    [provider]  megestrol (MEGACE) 20 MG tablet Take 1 tablet (20 mg total) by mouth daily. 11/04/21   Donnamae Jude, MD  PROAIR HFA 108 [phone] Base) MCG/ACT inhaler Inhale 1-2 puffs into the lungs 3 (three) times daily as needed for wheezing or shortness of breath. For wheezing. 09/04/15    [provider]      Allergies    Pollen extract and Oxycodone-acetaminophen    Review of Systems   Review of Systems  Gastrointestinal:  Positive for abdominal pain and diarrhea.    Physical Exam Updated Vital Signs BP 132/76   Pulse 86   Temp 98.4 F (36.9 C) (Oral)   Resp 18   SpO2 100%  Physical Exam Vitals and nursing note reviewed.  Constitutional:      Appearance: She is well-developed. She is not diaphoretic.  HENT:     Head: Normocephalic and atraumatic.     Right Ear: External ear normal.     Left Ear: External ear normal.  Eyes:     General: No scleral icterus.       Right eye: No discharge.        Left eye: No discharge.     Conjunctiva/sclera: Conjunctivae normal.  Neck:     Trachea: No tracheal deviation.  Cardiovascular:     Rate and Rhythm: Normal rate and regular rhythm.  Pulmonary:     Effort: Pulmonary effort is normal. No respiratory distress.     Breath sounds: Normal breath sounds. No stridor. No wheezing or rales.  Abdominal:     General: Bowel sounds are normal. There is no distension.     Palpations: Abdomen is soft.     Tenderness: There is abdominal tenderness in the epigastric area.  There is no guarding or rebound.     Comments: Mild tenderness in the epigastric region, no rebound or guarding, no mass appreciated  Musculoskeletal:        General: No tenderness or deformity.     Cervical back: Neck supple.  Skin:    General: Skin is warm and dry.     Findings: No rash.  Neurological:     General: No focal deficit present.     Mental Status: She is alert.     Cranial Nerves: No cranial nerve deficit, dysarthria or facial asymmetry.     Sensory: No sensory deficit.     Motor: No abnormal muscle tone or seizure activity.     Coordination: Coordination normal.  Psychiatric:        Mood and Affect: Mood normal.     ED Results / Procedures / Treatments   Labs (all labs ordered are listed, but only abnormal results are  displayed) Labs Reviewed  CBC WITH DIFFERENTIAL/PLATELET - Abnormal; Notable for the following components:      Result Value   WBC 13.6 (*)    RBC 3.51 (*)    Hemoglobin 11.7 (*)    HCT 33.5 (*)    Neutro Abs 9.6 (*)    Eosinophils Absolute 0.8 (*)    All other components within normal limits  COMPREHENSIVE METABOLIC PANEL - Abnormal; Notable for the following components:   Chloride 97 (*)    Glucose, Bld 119 (*)    BUN 39 (*)    Creatinine, Ser 10.37 (*)    GFR, Estimated 4 (*)    Anion gap 16 (*)    All other components within normal limits  LIPASE, BLOOD - Abnormal; Notable for the following components:   Lipase 56 (*)    All other components within normal limits    EKG EKG Interpretation  Date/Time:  Friday January 08 2022 10:41:21 EST Ventricular Rate:  85 PR Interval:  144 QRS Duration: 113 QT Interval:  444 QTC Calculation: 528 R Axis:   26 Text Interpretation: Sinus rhythm Probable left ventricular hypertrophy Prolonged QT interval No significant change since last tracing Confirmed by Dorie Rank [phone]) on 01/08/2022 10:43:59 AM  Radiology US Abdomen Limited RUQ (LIVER/GB)  Result Date: 01/08/2022 CLINICAL DATA:  Abdominal pain for 2 days EXAM: ULTRASOUND ABDOMEN LIMITED RIGHT UPPER QUADRANT COMPARISON:  None Available. FINDINGS: Gallbladder: At least 1 stone is identified in the gallbladder which is otherwise normal in appearance. No Murphy's sign. Common bile duct: Diameter: 4.4 mm Liver: Increased heterogeneous echogenicity. No focal mass. Portal vein is patent on color Doppler imaging with normal direction of blood flow towards the liver. Other: A 2 cm cyst is identified in the right kidney. No follow-up imaging recommended for the cyst. IMPRESSION: 1. Cholelithiasis in an otherwise normal appearing gallbladder. 2. Increased heterogeneous echogenicity throughout the liver is nonspecific but often due to hepatic steatosis. Other forms of intrinsic liver disease may  result in increased echogenicity. Electronically Signed   By: Dorise Bullion III M.D.   On: 01/08/2022 10:24    Procedures Procedures    Medications Ordered in ED Medications  alum & mag hydroxide-simeth (MAALOX/MYLANTA) 200-200-20 MG/5ML suspension 30 mL (30 mLs Oral Given 01/08/22 1027)  pantoprazole (PROTONIX) EC tablet 40 mg (40 mg Oral Given 01/08/22 1027)    ED Course/ Medical Decision Making/ A&P Clinical Course as of 01/08/22 1053  Fri Jan 08, 2022  0956 CBC with Differential(!) Leukocytosis noted [JK]  0956 Comprehensive  metabolic panel(!) Abnormal renal function consistent with her chronic kidney disease [JK]  0956 Lipase, blood(!) Lipase mildly elevated, [JK]  1029 Ultrasound shows cholelithiasis but no evidence of cholecystitis [JK]    Clinical Course User Index [JK] Dorie Rank, MD                           Medical Decision Making Problems Addressed: Calculus of gallbladder without cholecystitis without obstruction: undiagnosed new problem with uncertain prognosis ESRD (end stage renal disease) on dialysis [facility]): chronic illness or injury Gastroesophageal reflux disease, unspecified whether esophagitis present: acute illness or injury that poses a threat to life or bodily functions  Amount and/or Complexity of Data Reviewed Labs:  Decision-making details documented in ED Course. Radiology: ordered and independent interpretation performed.  Risk OTC drugs. Prescription drug management.   Patient presents ED for evaluation of upper abdominal discomfort.  Patient has noted some acid burning type discomfort.  She denies any vomiting.  She has not been having any trouble eating or drinking.  Patient does have some mild tenderness on exam but no right upper quadrant tenderness.  No Murphy sign.  Did consider possibility of acute cholecystitis or pancreatitis.  Ultrasound was performed and it does show evidence of gallstones but no evidence to suggest Coley cystitis.   Patient was treated with antacids.  Will plan on discharge home with antacid medications.  Consider GI follow-up.  Warning signs, return precautions discussed with patient.  Patient did not go to dialysis today.  No findings to suggest need for emergent dialysis.  Patient states she will go to dialysis tomorrow to make up her missed session.        Final Clinical Impression(s) / ED Diagnoses Final diagnoses:  Gastroesophageal reflux disease, unspecified whether esophagitis present  Calculus of gallbladder without cholecystitis without obstruction    Rx / DC Orders ED Discharge Orders          Ordered    pantoprazole (PROTONIX) 40 MG tablet  Daily        01/08/22 1051              Dorie Rank, MD 01/08/22 1054

## 2022-01-08 NOTE — Discharge Instructions (Signed)
Gallstones but do not think this is the source of your discomfort.  Take the medications as prescribed to help with acid reflux.  Follow-up with your primary care doctor or consider seeing a GI doctor if the symptoms persist.  Return to the ED for fevers vomiting worsening symptoms

## 2022-01-08 NOTE — ED Triage Notes (Signed)
Patient reports upper and lower abdominal pain with diarrhea and nausea this evening , hemodialysis q Mon/Wed/Fri.

## 2022-01-09 ENCOUNTER — Encounter (HOSPITAL_COMMUNITY): Payer: Self-pay

## 2022-01-12 ENCOUNTER — Ambulatory Visit (INDEPENDENT_AMBULATORY_CARE_PROVIDER_SITE_OTHER): Payer: 59 | Admitting: Gastroenterology

## 2022-01-12 ENCOUNTER — Encounter: Payer: Self-pay | Admitting: Gastroenterology

## 2022-01-12 VITALS — BP 150/90 | HR 65 | Ht 69.5 in | Wt 303.8 lb

## 2022-01-12 DIAGNOSIS — Z992 Dependence on renal dialysis: Secondary | ICD-10-CM

## 2022-01-12 DIAGNOSIS — N186 End stage renal disease: Secondary | ICD-10-CM

## 2022-01-12 DIAGNOSIS — Z1211 Encounter for screening for malignant neoplasm of colon: Secondary | ICD-10-CM | POA: Diagnosis not present

## 2022-01-12 DIAGNOSIS — R1012 Left upper quadrant pain: Secondary | ICD-10-CM

## 2022-01-12 DIAGNOSIS — R142 Eructation: Secondary | ICD-10-CM | POA: Diagnosis not present

## 2022-01-12 DIAGNOSIS — R12 Heartburn: Secondary | ICD-10-CM

## 2022-01-12 DIAGNOSIS — K8020 Calculus of gallbladder without cholecystitis without obstruction: Secondary | ICD-10-CM | POA: Diagnosis not present

## 2022-01-12 DIAGNOSIS — K8080 Other cholelithiasis without obstruction: Secondary | ICD-10-CM

## 2022-01-12 MED ORDER — ONDANSETRON HCL 4 MG PO TABS: 4.0000 mg | ORAL_TABLET | Freq: Two times a day (BID) | ORAL | 1 refills | Status: DC

## 2022-01-12 MED ORDER — PANTOPRAZOLE SODIUM 40 MG PO TBEC: 40.0000 mg | DELAYED_RELEASE_TABLET | Freq: Every day | ORAL | 12 refills | Status: DC

## 2022-01-12 MED ORDER — NA SULFATE-K SULFATE-MG SULF 17.5-3.13-1.6 GM/177ML PO SOLN: 1.0000 | ORAL | 0 refills | Status: DC

## 2022-01-12 NOTE — Patient Instructions (Signed)
We have sent the following medications to your pharmacy for you to pick up at your convenience: Suprep , Pantoprazole, Zofran   You have been scheduled for an endoscopy and colonoscopy. Please follow the written instructions given to you at your visit today. Please pick up your prep supplies at the pharmacy within the next 1-3 days. If you use inhalers (even only as needed), please bring them with you on the day of your procedure.  Due to recent changes in healthcare laws, you may see the results of your imaging and laboratory studies on MyChart before your provider has had a chance to review them.  We understand that in some cases there may be results that are confusing or concerning to you. Not all laboratory results come back in the same time frame and the provider may be waiting for multiple results in order to interpret others.  Please give [place] 48 hours in order for your provider to thoroughly review all the results before contacting the office for clarification of your results.   Thank you for choosing me and [facility].  Dr. Rush Landmark

## 2022-01-12 NOTE — Progress Notes (Signed)
[facility] VISIT   Primary Care Provider Glendale Chard, [place] [address] [place] [address] [phone]  Referring Provider Glendale Chard, [place] [address],  [place] [address] [phone]  Patient Profile: [name] is a 47 y.o. female with a pmh significant for ESRD (Monday/Wednesday/Friday), hypertension, obesity, chronic anemia, cholelithiasis.  The patient presents to the [facility] for an evaluation and management of problem(s) noted below:  Problem List 1. Calculus of gallbladder without cholecystitis without obstruction   2. Belching   3. Left upper quadrant abdominal pain   4. Colon cancer screening   5. Pyrosis   6. ESRD on dialysis [facility])     History of Present Illness This is the patient's first visit to the outpatient [place] clinic.  The patient experienced significant left upper quadrant abdominal pain with increasing amounts of sulfur type burps that had associated nausea.  As this has occurred on occasion but gone away on its own she did not think of it initially but as things progressed and worsened then on Friday she had worsening symptoms that ended up taking her to the emergency department.  She had a right upper quadrant ultrasound performed that showed cholelithiasis but again her pain was left upper quadrant.  She was initiated on Protonix and feels slightly better with this.  She moves her bowels on a daily basis.  She has never been tested for H. pylori.  She denies any significant issues of dysphagia.  At times she will have heartburn like symptoms but does not take medications on a regular basis.  Her stools are slightly darker as a result of her iron that she takes.  Otherwise she has not had any changes in her bowel habits.  She has never had an upper or lower endoscopy.  GI Review of Systems Positive as above Negative for odynophagia, melena,  hematochezia  Review of Systems General: Denies fevers/chills/weight loss unintentionally Cardiovascular: Denies chest pain Pulmonary: Denies shortness of breath Gastroenterological: See HPI Genitourinary: Denies darkened urine Hematological: Denies easy bruising/bleeding Dermatological: Denies jaundice Psychological: Mood is stable   Medications Current Outpatient Medications  Medication Sig Dispense Refill   Aspirin-Caffeine (BAYER BACK & BODY) 500-32.5 MG TABS Take 2 tablets by mouth 2 (two) times daily as needed (pain.).     AURYXIA 1 GM 210 MG(Fe) tablet Take 630 mg by mouth See admin instructions. Take 3 tablets (630 mg) by mouth with meals and snacks     cinacalcet (SENSIPAR) 60 MG tablet Take 60 mg by mouth in the morning.     megestrol (MEGACE) 20 MG tablet Take 1 tablet (20 mg total) by mouth daily. 60 tablet 3   Na Sulfate-K Sulfate-Mg Sulf (SUPREP BOWEL PREP KIT) 17.5-3.13-1.6 GM/177ML SOLN Take 1 kit by mouth as directed. For colonoscopy prep 354 mL 0   ondansetron (ZOFRAN) 4 MG tablet Take 1 tablet (4 mg total) by mouth every 12 (twelve) hours. 15 tablet 1   PROAIR HFA 108 (90 Base) MCG/ACT inhaler Inhale 1-2 puffs into the lungs 3 (three) times daily as needed for wheezing or shortness of breath. For wheezing.  2   pantoprazole (PROTONIX) 40 MG tablet Take 1 tablet (40 mg total) by mouth daily. 30 tablet 12   No current facility-administered medications for this visit.    Allergies Allergies  Allergen Reactions   Pollen Extract Itching    Unknown   Oxycodone-Acetaminophen Itching    Tolerates with benadryl  Histories Past Medical History:  Diagnosis Date   Anemia    Anxiety    pt not aware of this   Arthritis    Breast mass 09/01/2016   Targeted ultrasound is performed, showing a mass in the left breast at 11 o'clock, 1 cm from the nipple measuring 6.4 by 5.0 cm. The mass is heterogeneous and may contain macroscopic fat.   IMPRESSION: The mass in the  left breast is probably benign, possibly a Hamartoma.  RECOMMENDATION: Recommend six-month follow-up mammogram of the probably benign left breast mass. If necessary, an ultrasound co   CHF (congestive heart failure) ([place])    hosp. 2011   Chronic kidney disease    Mon Wed Friday   Dilated cardiomyopathy ([place])    Encounter for blood transfusion    ESRD (end stage renal disease) ([place])    MWF - [place]   Family history of adverse reaction to anesthesia     " Aunt has a tracheostomy and kept coughing after colonoscopy "   Foot fracture    Gout    no flare up for several yrs   Headache    Heart murmur    as a child, no problems as an adult   Hemodialysis patient ([place])    History of blood transfusion    x 2   History of pneumonia    Hyperlipidemia    Hypertension    Menorrhagia    Morbid obesity ([place])    Pneumonia    hosp. 2010   Shortness of breath    HX CHL - occasional. 04/09/21 patient sits down and shortness of breath susides.   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   Thyroid disease    Past Surgical History:  Procedure Laterality Date   A/V FISTULAGRAM Left 08/13/2021   Procedure: A/V Fistulagram;  Surgeon: Marty Heck, MD;  Location: [facility];  Service: Cardiovascular;  Laterality: Left;   ANGIOPLASTY  07/28/2015   Procedure: ANGIOPLASTY OF RIGHT BRACHIAL VEIN;  Surgeon: Conrad Foxburg, MD;  Location: [facility];  Service: Vascular;;   AV FISTULA PLACEMENT  08/04/2011   Procedure: ARTERIOVENOUS (AV) FISTULA CREATION;  Surgeon: Elam Dutch, MD;  Location: [facility];  Service: Vascular;  Laterality: Right;  Creation right brachiocephalic arteriovenous fistula   AV FISTULA PLACEMENT Left 02/05/2013   Procedure: ARTERIOVENOUS FISTULA CREATION LEFT ARM;  Surgeon: Conrad Oketo, MD;  Location: [facility];  Service: Vascular;  Laterality:  Left;   AV FISTULA PLACEMENT Right 06/04/2013   Procedure: ARTERIOVENOUS (AV) FISTULA CREATION;  Surgeon: Conrad Cedarville, MD;  Location: [facility];  Service: Vascular;  Laterality: Right;   AV FISTULA PLACEMENT Right 04/15/2014   Procedure: INSERTION OF ARTERIOVENOUS (AV) GORE-TEX GRAFT ARM;  Surgeon: Conrad Shaft, MD;  [facility];  Service: Vascular;  Laterality: Right;   AV FISTULA PLACEMENT Left 02/25/2021   Procedure: LEFT ARM ARTERIOVENOUS (AV) FISTULA CREATION;  Surgeon: Marty Heck, MD;  Location: [facility];  Service: Vascular;  Laterality: Left;   AV FISTULA PLACEMENT Left 08/26/2021   Procedure: LEFT ARM LIGATION OF BRACHIOBASILIC FISTULA,  REPAIR PSEUDOANEURYSM;  Surgeon: Marty Heck, MD;  Location: [facility];  Service: Vascular;  Laterality: Left;   AV FISTULA PLACEMENT Left 10/12/2021   Procedure: LEFT RADIOCEPHALIC ARTERIOVENOUS FISTULA  ARTERIOVENOUS (AV) FISTULA CREATION WITH  INTERPOSITION GRAFT;  Surgeon: Marty Heck, MD;  Location: [facility];  Service: Vascular;  Laterality: Left;   [place] Right 08/13/2013   Procedure: Second Stage Brachial Vein Transposition;  Surgeon: Conrad Holyoke, MD;  Location: [facility];  Service: Vascular;  Laterality: Right;   [place] Left 04/13/2021   Procedure: LEFT ARM SECOND STAGE BASILIC VEIN TRANSPOSITION;  Surgeon: Marty Heck, MD;  Location: [facility];  Service: Vascular;  Laterality: Left;   CARDIAC SURGERY  1979   repair of hole in heart   GRAFT APPLICATION Left 24/05/8097   Procedure: ARTEGRAFT COLLAGEN VASCULAR GRAFT APPLICATION;  Surgeon: Marty Heck, MD;  Location: [facility];  Service: Vascular;  Laterality: Left;   INCISION AND DRAINAGE OF WOUND Right 07/28/2015   Procedure: IRRIGATION AND DEBRIDEMENT RIGHT HAND  WOUND;  Surgeon: Dayna Barker, MD;  Location: [facility];  Service: Plastics;  Laterality: Right;   LIGATION OF ARTERIOVENOUS  FISTULA Left 03/05/2013   Procedure: LIGATION OF  ARTERIOVENOUS  FISTULA;  Surgeon: Conrad [place] Lake, MD;  Location: [facility];  Service: Vascular;  Laterality: Left;   PATCH ANGIOPLASTY Left 03/05/2013   Procedure: PATCH ANGIOPLASTY;  Surgeon: Conrad Sand Hill, MD;  Location: [facility];  Service: Vascular;  Laterality: Left;   PERIPHERAL VASCULAR BALLOON ANGIOPLASTY Left 08/13/2021   Procedure: PERIPHERAL VASCULAR BALLOON ANGIOPLASTY;  Surgeon: Marty Heck, MD;  Location: [facility];  Service: Cardiovascular;  Laterality: Left;  arm fistula   PERIPHERAL VASCULAR CATHETERIZATION Right 10/31/2014   Procedure: Fistulagram;  Surgeon: Conrad Kualapuu, MD;  Location: [facility];  Service: Cardiovascular;  Laterality: Right;   PERIPHERAL VASCULAR CATHETERIZATION Right 10/31/2014   Procedure: Peripheral Vascular Balloon Angioplasty;  Surgeon: Conrad Franklin, MD;  Location: [facility];  Service: Cardiovascular;  Laterality: Right;  pta venous rt arm   PERIPHERAL VASCULAR CATHETERIZATION N/A 03/13/2015   Procedure: A/V Shuntogram;  Surgeon: Conrad Walton, MD;  Location: [facility];  Service: Cardiovascular;  Laterality: N/A;   PERIPHERAL VASCULAR CATHETERIZATION Right 03/13/2015   Procedure: Peripheral Vascular Balloon Angioplasty;  Surgeon: Conrad Freer, MD;  Location: [facility];  Service: Cardiovascular;  Laterality: Right;  Shunt   PERIPHERAL VASCULAR CATHETERIZATION Right 01/19/2016   Procedure: A/V Shuntogram;  Surgeon: Angelia Mould, MD;  Location: [facility];  Service: Cardiovascular;  Laterality: Right;   REVISION OF ARTERIOVENOUS GORETEX GRAFT Right 10/27/2015   Procedure: THROMBECTOMY AND REVISION OF RIGHT UPPER ARM  ARTERIOVENOUS GORETEX GRAFT USING 6MM X 10CM GORE-TEX GRAFT;  Surgeon: Elam Dutch, MD;  Location: [facility];  Service: Vascular;  Laterality: Right;   REVISION OF ARTERIOVENOUS GORETEX GRAFT Right 01/30/2016   Procedure: REVISION OF RIGHT UPPER ARM ARTERIOVENOUS USING GORETEX GRAFT;  Surgeon:  Angelia Mould, MD;  Location: [facility];  Service: Vascular;  Laterality: Right;   THROMBECTOMY W/ EMBOLECTOMY Left 03/05/2013   Procedure: THROMBECTOMY OF LEFT BRACHIAL ARTERY;  Surgeon: Conrad Petros, MD;  Location: [facility];  Service: Vascular;  Laterality: Left;   VENOGRAM Bilateral 03/25/2014   Procedure: VENOGRAM bilateral;  Surgeon: Conrad , MD;  Location[facility];  Service: Cardiovascular;  Laterality: Bilateral;   Social History   Socioeconomic History   Marital status: Married    Spouse name: Not on file   Number of children: Not on file   Years of education: Not on file   Highest education level: Not  on file  Occupational History   Not on file  Tobacco Use   Smoking status: Never    Passive exposure: Never   Smokeless tobacco: Never  Vaping Use   Vaping Use: Never used  Substance and Sexual Activity   Alcohol use: No    Alcohol/week: 0.0 standard drinks of alcohol   Drug use: No   Sexual activity: Not Currently    Comment: no birth control   Other Topics Concern   Not on file  Social History Narrative   Not on file   Social Determinants of Health   Financial Resource Strain: Not on file  Food Insecurity: No Food Insecurity (08/17/2021)   Hunger Vital Sign    Worried About Running Out of Food in the Last Year: Never true    Ran Out of Food in the Last Year: Never true  Transportation Needs: No Transportation Needs (08/17/2021)   PRAPARE - Hydrologist (Medical): No    Lack of Transportation (Non-Medical): No  Physical Activity: Not on file  Stress: Not on file  Social Connections: Not on file  Intimate Partner Violence: Not on file   Family History  Problem Relation Age of Onset   Diabetes Mother    Hyperlipidemia Mother    Hypertension Mother    Kidney disease Mother    Anesthesia problems Neg Hx    Hypotension Neg Hx    Malignant hyperthermia Neg Hx    Pseudochol deficiency Neg Hx    Colon cancer Neg Hx     Esophageal cancer Neg Hx    Inflammatory bowel disease Neg Hx    Liver disease Neg Hx    Pancreatic cancer Neg Hx    Rectal cancer Neg Hx    Stomach cancer Neg Hx    I have reviewed her medical, social, and family history in detail and updated the electronic medical record as necessary.    PHYSICAL EXAMINATION  BP (!) 150/90   Pulse 65   Ht 5' 9.5" (1.765 m)   Wt (!) 303 lb 12.8 oz (137.8 kg)   BMI 44.22 kg/m  Wt Readings from Last 3 Encounters:  01/12/22 (!) 303 lb 12.8 oz (137.8 kg)  11/10/21 (!) 304 lb 9.6 oz (138.2 kg)  10/12/21 (!) 303 lb (137.4 kg)  GEN: NAD, appears stated age, doesn't appear chronically ill PSYCH: Cooperative, without pressured speech EYE: Conjunctivae pink, sclerae anicteric ENT: MMM CV: Nontachycardic RESP: No audible wheezing GI: NABS, soft, obese, rounded, NT/ND, without rebound or guarding MSK/EXT: Bilateral pedal edema present SKIN: No jaundice NEURO:  Alert & Oriented x 3, no focal deficits   REVIEW OF DATA  I reviewed the following data at the time of this encounter:  GI Procedures and Studies  No relevant studies to review  Laboratory Studies  Reviewed those in epic  Imaging Studies  January 2024 RUQUS IMPRESSION: 1. Cholelithiasis in an otherwise normal appearing gallbladder. 2. Increased heterogeneous echogenicity throughout the liver is nonspecific but often due to hepatic steatosis. Other forms of intrinsic liver disease may result in increased echogenicity.   ASSESSMENT  Ms. Bartley is a 47 y.o. female with a pmh significant for ESRD (Monday/Wednesday/Friday), hypertension, obesity, chronic anemia, cholelithiasis.  The patient is seen today for evaluation and management of:  1. Calculus of gallbladder without cholecystitis without obstruction   2. Belching   3. Left upper quadrant abdominal pain   4. Colon cancer screening   5. Pyrosis   6. ESRD  on dialysis [facility])    The patient is hemodynamically and clinically  stable today.  Etiology of the patient's symptoms may well be underlying gastritis.  H. pylori should be ruled out as well in regards to some of her recurrent symptoms.  GERD/pyrosis seems to be more likely an etiology for her symptoms at times.  Will maintain her on PPI therapy as had been prescribed by the ED.  I am not sure that the cholelithiasis will ever be a problem for her but it is important to keep this in mind but I do not think it is the reason for her symptoms currently.  No plan for surgical referral currently unless liver test began to have some abnormalities or pain becomes more epigastric/right upper quadrant postprandially.  A diagnostic endoscopy is recommended at this time.  She is due for colon cancer screening and she will be prepared for colonoscopy at the same time as her endoscopy.  The risks and benefits of endoscopic evaluation were discussed with the patient; these include but are not limited to the risk of perforation, infection, bleeding, missed lesions, lack of diagnosis, severe illness requiring hospitalization, as well as anesthesia and sedation related illnesses.  The patient and/or family is agreeable to proceed.  All patient questions were answered to the best of my ability, and the patient agrees to the aforementioned plan of action with follow-up as indicated.   PLAN  Continue PPI once daily Diagnostic endoscopy to be pursued Screening colonoscopy to be pursued Procedures will be scheduled in the hospital outpatient setting due to ESRD High-fiber diet is recommended as well as fiber supplementation Holding on surgical referral for cholelithiasis this is not clear that this is an issue for her currently   Orders Placed This Encounter  Procedures   Procedural/ Surgical Case Request: COLONOSCOPY WITH PROPOFOL, ESOPHAGOGASTRODUODENOSCOPY (EGD) WITH PROPOFOL   Ambulatory referral to Gastroenterology    New Prescriptions   NA SULFATE-K SULFATE-MG SULF (SUPREP BOWEL  PREP KIT) 17.5-3.13-1.6 GM/177ML SOLN    Take 1 kit by mouth as directed. For colonoscopy prep   ONDANSETRON (ZOFRAN) 4 MG TABLET    Take 1 tablet (4 mg total) by mouth every 12 (twelve) hours.   Modified Medications   Modified Medication Previous Medication   PANTOPRAZOLE (PROTONIX) 40 MG TABLET pantoprazole (PROTONIX) 40 MG tablet      Take 1 tablet (40 mg total) by mouth daily.    Take 1 tablet (40 mg total) by mouth daily.    Planned Follow Up No follow-ups on file.   Total Time in Face-to-Face and in Coordination of Care for patient including independent/personal interpretation/review of prior testing, medical history, examination, medication adjustment, communicating results with the patient directly, and documentation within the EHR is 45 minutes.   Justice Britain, MD [facility] Office # [phone]

## 2022-01-13 ENCOUNTER — Telehealth: Payer: Self-pay

## 2022-01-13 ENCOUNTER — Encounter: Payer: Self-pay | Admitting: Gastroenterology

## 2022-01-13 NOTE — Telephone Encounter (Signed)
     Patient  visit on 1/5  at [facility]    Have you been able to follow up with your primary care physician? Yes   The patient was or was not able to obtain any needed medicine or equipment. Yes   Are there diet recommendations that you are having difficulty following? Na   Patient expresses understanding of discharge instructions and education provided has no other needs at this time.  Yes      [place], [facility], Care Management  [phone] [address], [place], [place] [address] Phone: [phone] Email: [email or web address]

## 2022-01-14 ENCOUNTER — Ambulatory Visit: Payer: Medicare Other

## 2022-01-15 DIAGNOSIS — R12 Heartburn: Secondary | ICD-10-CM | POA: Insufficient documentation

## 2022-01-15 DIAGNOSIS — R1012 Left upper quadrant pain: Secondary | ICD-10-CM | POA: Insufficient documentation

## 2022-01-15 DIAGNOSIS — Z1211 Encounter for screening for malignant neoplasm of colon: Secondary | ICD-10-CM | POA: Insufficient documentation

## 2022-01-15 DIAGNOSIS — R142 Eructation: Secondary | ICD-10-CM | POA: Insufficient documentation

## 2022-01-15 DIAGNOSIS — K8020 Calculus of gallbladder without cholecystitis without obstruction: Secondary | ICD-10-CM | POA: Insufficient documentation

## 2022-01-19 ENCOUNTER — Ambulatory Visit
Admission: RE | Admit: 2022-01-19 | Discharge: 2022-01-19 | Disposition: A | Discharge status: Home / Self Care | Payer: 59 | Source: Ambulatory Visit | Attending: Internal Medicine | Admitting: Internal Medicine

## 2022-01-19 DIAGNOSIS — Z1231 Encounter for screening mammogram for malignant neoplasm of breast: Secondary | ICD-10-CM

## 2022-02-09 ENCOUNTER — Ambulatory Visit (INDEPENDENT_AMBULATORY_CARE_PROVIDER_SITE_OTHER): Payer: 59 | Admitting: Obstetrics and Gynecology

## 2022-02-09 ENCOUNTER — Other Ambulatory Visit: Payer: Self-pay

## 2022-02-09 ENCOUNTER — Other Ambulatory Visit (HOSPITAL_COMMUNITY)
Admission: RE | Admit: 2022-02-09 | Discharge: 2022-02-09 | Disposition: A | Discharge status: Home / Self Care | Payer: 59 | Source: Ambulatory Visit | Attending: Obstetrics and Gynecology | Admitting: Obstetrics and Gynecology

## 2022-02-09 VITALS — BP 148/88 | HR 93 | Ht 64.0 in | Wt 307.0 lb

## 2022-02-09 DIAGNOSIS — Z01419 Encounter for gynecological examination (general) (routine) without abnormal findings: Secondary | ICD-10-CM

## 2022-02-09 DIAGNOSIS — N921 Excessive and frequent menstruation with irregular cycle: Secondary | ICD-10-CM | POA: Diagnosis not present

## 2022-02-09 DIAGNOSIS — Z1151 Encounter for screening for human papillomavirus (HPV): Secondary | ICD-10-CM | POA: Insufficient documentation

## 2022-02-09 MED ORDER — MEGESTROL ACETATE 20 MG PO TABS: 20.0000 mg | ORAL_TABLET | Freq: Every day | ORAL | 3 refills | Status: DC

## 2022-02-09 NOTE — Progress Notes (Signed)
ANNUAL EXAM Patient name: [name] MRN [id number]  Date of birth: Sep 28, 1975 Chief Complaint:   Gynecologic Exam  History of Present Illness:   [name] is a 47 y.o. female being seen today for a routine annual exam.   Current complaints: None - she would like refill on her Megace.   No LMP recorded. (Menstrual status: Other).  Current birth control: Abstinence  Last pap: 10/2017. Results were: NILM w/ HRHPV negative. H/O abnormal pap: no Last MXR: 11/2020  Health Maintenance Due  Topic Date Due   Medicare Annual Wellness (AWV)  Never done   COVID-19 Vaccine (1) Never done   COLONOSCOPY (Pts 45-58yr Insurance coverage will need to be confirmed)  Never done    Review of Systems:   Pertinent items are noted in HPI Denies any headaches, blurred vision, fatigue, shortness of breath, chest pain, abdominal pain, abnormal vaginal discharge/itching/odor/irritation, problems with periods, bowel movements, urination, or intercourse unless otherwise stated above.  Pertinent History Reviewed:  Reviewed past medical,surgical, social and family history.  Reviewed problem list, medications and allergies. Physical Assessment:   Vitals:   02/09/22 0925 02/09/22 0935  BP: (!) 153/88 (!) 148/88  Pulse: 93   Weight: (!) 307 lb (139.3 kg)   Height: '5\' 4"'$  (1.626 m)   Body mass index is 52.7 kg/m.   Physical Examination:  General appearance - well appearing, and in no distress Mental status - alert, oriented to person, place, and time Psych:  She has a normal mood and affect Skin - warm and dry, normal color, no suspicious lesions noted Chest - effort normal Heart - normal rate  Breasts - breasts appear normal, no suspicious masses, no skin or nipple changes or axillary nodes Abdomen - soft, nontender, nondistended, no masses or organomegaly Pelvic -  VULVA: normal appearing vulva with no masses, tenderness or lesions  VAGINA: normal appearing vagina with normal color  and discharge, no lesions  CERVIX: normal appearing cervix without discharge or lesions, no CMT UTERUS: uterus is felt to be normal size, shape, consistency and nontender  ADNEXA: No adnexal masses or tenderness noted. Extremities:  No swelling or varicosities noted  Chaperone present for exam  No results found for this or any previous visit (from the past 24 hour(s)).  Assessment & Plan:  ROluwatosinwas seen today for gynecologic exam.  Diagnoses and all orders for this visit:  Encounter for annual routine gynecological examination - Cervical cancer screening: Discussed guidelines. Pap with HPV done - Birth Control: Abstinence - Breast Health: Encouraged self breast awareness/SBE. Teaching provided. Discussed limits of clinical breast exam for detecting breast cancer.  Waiting for removal of catheter on her chest wall and once removed she plans to go for MXR.  - F/U 12 months and prn -     Cytology - PAP  Menorrhagia with irregular cycle Continue Megace for now but check FFaisonnow and then again in one year. If both menopausal range could stop Megace. She does not hot flashes on occasion. Otherwise, she is very happy on it.  -     megestrol (MEGACE) 20 MG tablet; Take 1 tablet (20 mg total) by mouth daily. -     FLearned     Orders Placed This Encounter  Procedures   FCamp [place]   Meds:  Meds ordered this encounter  Medications   megestrol (MEGACE) 20 MG tablet    Sig: Take 1 tablet (20 mg total) by mouth daily.  Dispense:  60 tablet    Refill:  3    Follow-up: No follow-ups on file.  Radene Gunning, MD 02/09/2022 9:47 AM

## 2022-02-10 LAB — FOLLICLE STIMULATING HORMONE: FSH: 2.7 m[IU]/mL

## 2022-02-11 LAB — CYTOLOGY - PAP
Chlamydia: NEGATIVE
Comment: NEGATIVE
Comment: NEGATIVE
Comment: NEGATIVE
Comment: NORMAL
Diagnosis: NEGATIVE
High risk HPV: NEGATIVE
Neisseria Gonorrhea: NEGATIVE
Trichomonas: NEGATIVE

## 2022-02-25 ENCOUNTER — Encounter (HOSPITAL_COMMUNITY): Payer: Self-pay | Admitting: Gastroenterology

## 2022-02-25 NOTE — Progress Notes (Signed)
Attempted to obtain medical history via telephone, unable to reach at this time. HIPAA compliant voicemail message left requesting return call to pre surgical testing department. 

## 2022-03-02 ENCOUNTER — Telehealth: Payer: Self-pay | Admitting: Gastroenterology

## 2022-03-02 NOTE — Telephone Encounter (Signed)
Inbound call from patient stating she needed to cancel her procedure with Dr. Rush Landmark on 2/29 at [facility]. Patient stated she will call back at a later time to reschedule.

## 2022-03-03 NOTE — Telephone Encounter (Signed)
ECL at [facility] has been cancelled. Ro, I left the gap open in case you have someone that can go there. Thanks

## 2022-03-03 NOTE — Telephone Encounter (Signed)
Recall has been entered  

## 2022-03-04 ENCOUNTER — Ambulatory Visit (HOSPITAL_COMMUNITY): Admission: RE | Admit: 2022-03-04 | Payer: 59 | Source: Home / Self Care | Admitting: Gastroenterology

## 2022-03-04 ENCOUNTER — Encounter (HOSPITAL_COMMUNITY): Admission: RE | Payer: Self-pay | Source: Home / Self Care

## 2022-03-04 SURGERY — COLONOSCOPY WITH PROPOFOL
Anesthesia: Monitor Anesthesia Care

## 2022-03-24 ENCOUNTER — Encounter (HOSPITAL_COMMUNITY): Payer: Self-pay | Admitting: Emergency Medicine

## 2022-03-24 ENCOUNTER — Emergency Department (HOSPITAL_COMMUNITY)
Admission: EM | Admit: 2022-03-24 | Discharge: 2022-03-25 | Disposition: A | Discharge status: Home / Self Care | Payer: 59 | Attending: Emergency Medicine | Admitting: Emergency Medicine

## 2022-03-24 ENCOUNTER — Emergency Department (HOSPITAL_COMMUNITY): Payer: 59

## 2022-03-24 ENCOUNTER — Other Ambulatory Visit: Payer: Self-pay

## 2022-03-24 DIAGNOSIS — J810 Acute pulmonary edema: Secondary | ICD-10-CM | POA: Diagnosis not present

## 2022-03-24 DIAGNOSIS — Z7982 Long term (current) use of aspirin: Secondary | ICD-10-CM | POA: Diagnosis not present

## 2022-03-24 DIAGNOSIS — Z1152 Encounter for screening for COVID-19: Secondary | ICD-10-CM | POA: Diagnosis not present

## 2022-03-24 DIAGNOSIS — I120 Hypertensive chronic kidney disease with stage 5 chronic kidney disease or end stage renal disease: Secondary | ICD-10-CM | POA: Insufficient documentation

## 2022-03-24 DIAGNOSIS — Z992 Dependence on renal dialysis: Secondary | ICD-10-CM

## 2022-03-24 DIAGNOSIS — I5043 Acute on chronic combined systolic (congestive) and diastolic (congestive) heart failure: Secondary | ICD-10-CM

## 2022-03-24 DIAGNOSIS — N186 End stage renal disease: Secondary | ICD-10-CM | POA: Insufficient documentation

## 2022-03-24 DIAGNOSIS — J9601 Acute respiratory failure with hypoxia: Secondary | ICD-10-CM | POA: Diagnosis present

## 2022-03-24 DIAGNOSIS — I10 Essential (primary) hypertension: Secondary | ICD-10-CM | POA: Diagnosis present

## 2022-03-24 DIAGNOSIS — I5042 Chronic combined systolic (congestive) and diastolic (congestive) heart failure: Secondary | ICD-10-CM

## 2022-03-24 DIAGNOSIS — D631 Anemia in chronic kidney disease: Secondary | ICD-10-CM | POA: Diagnosis present

## 2022-03-24 DIAGNOSIS — R0603 Acute respiratory distress: Secondary | ICD-10-CM | POA: Insufficient documentation

## 2022-03-24 DIAGNOSIS — R0602 Shortness of breath: Secondary | ICD-10-CM | POA: Diagnosis present

## 2022-03-24 LAB — CBC WITH DIFFERENTIAL/PLATELET
Abs Immature Granulocytes: 0.1 10*3/uL — ABNORMAL HIGH (ref 0.00–0.07)
Basophils Absolute: 0 10*3/uL (ref 0.0–0.1)
Basophils Relative: 0 %
Eosinophils Absolute: 0.2 10*3/uL (ref 0.0–0.5)
Eosinophils Relative: 1 %
HCT: 31.1 % — ABNORMAL LOW (ref 36.0–46.0)
Hemoglobin: 9.9 g/dL — ABNORMAL LOW (ref 12.0–15.0)
Immature Granulocytes: 1 %
Lymphocytes Relative: 8 %
Lymphs Abs: 1.3 10*3/uL (ref 0.7–4.0)
MCH: 31.9 pg (ref 26.0–34.0)
MCHC: 31.8 g/dL (ref 30.0–36.0)
MCV: 100.3 fL — ABNORMAL HIGH (ref 80.0–100.0)
Monocytes Absolute: 1.2 10*3/uL — ABNORMAL HIGH (ref 0.1–1.0)
Monocytes Relative: 7 %
Neutro Abs: 14.3 10*3/uL — ABNORMAL HIGH (ref 1.7–7.7)
Neutrophils Relative %: 83 %
Platelets: 217 10*3/uL (ref 150–400)
RBC: 3.1 MIL/uL — ABNORMAL LOW (ref 3.87–5.11)
RDW: 14.4 % (ref 11.5–15.5)
WBC: 17.2 10*3/uL — ABNORMAL HIGH (ref 4.0–10.5)
nRBC: 0 % (ref 0.0–0.2)

## 2022-03-24 LAB — BASIC METABOLIC PANEL
Anion gap: 15 (ref 5–15)
BUN: 67 mg/dL — ABNORMAL HIGH (ref 6–20)
CO2: 23 mmol/L (ref 22–32)
Calcium: 8.8 mg/dL — ABNORMAL LOW (ref 8.9–10.3)
Chloride: 98 mmol/L (ref 98–111)
Creatinine, Ser: 14.85 mg/dL — ABNORMAL HIGH (ref 0.44–1.00)
GFR, Estimated: 3 mL/min — ABNORMAL LOW (ref >60)
Glucose, Bld: 105 mg/dL — ABNORMAL HIGH (ref 70–99)
Potassium: 4.5 mmol/L (ref 3.5–5.1)
Sodium: 136 mmol/L (ref 135–145)

## 2022-03-24 LAB — RESP PANEL BY RT-PCR (RSV, FLU A&B, COVID)  RVPGX2
Influenza A by PCR: NEGATIVE
Influenza B by PCR: NEGATIVE
Resp Syncytial Virus by PCR: NEGATIVE
SARS Coronavirus 2 by RT PCR: NEGATIVE

## 2022-03-24 LAB — BRAIN NATRIURETIC PEPTIDE: B Natriuretic Peptide: 1484.7 pg/mL — ABNORMAL HIGH (ref 0.0–100.0)

## 2022-03-24 MED ORDER — METHYLPREDNISOLONE SODIUM SUCC 125 MG IJ SOLR
125.0000 mg | Freq: Once | INTRAMUSCULAR | Status: AC
  Administered 2022-03-24: 125 mg via INTRAVENOUS
  Filled 2022-03-24: qty 2

## 2022-03-24 MED ORDER — NITROGLYCERIN 0.4 MG SL SUBL
0.4000 mg | SUBLINGUAL_TABLET | Freq: Once | SUBLINGUAL | Status: AC
  Administered 2022-03-24: 0.4 mg via SUBLINGUAL
  Filled 2022-03-24: qty 1

## 2022-03-24 MED ORDER — MAGNESIUM SULFATE 2 GM/50ML IV SOLN
2.0000 g | Freq: Once | INTRAVENOUS | Status: AC
  Administered 2022-03-24: 2 g via INTRAVENOUS
  Filled 2022-03-24: qty 50

## 2022-03-24 MED ORDER — IPRATROPIUM-ALBUTEROL 0.5-2.5 (3) MG/3ML IN SOLN
3.0000 mL | RESPIRATORY_TRACT | Status: AC
  Administered 2022-03-24 (×3): 3 mL via RESPIRATORY_TRACT
  Filled 2022-03-24: qty 3
  Filled 2022-03-24: qty 6

## 2022-03-24 NOTE — Assessment & Plan Note (Addendum)
MWF HD -Cr is slightly higher than baseline at 14 -Nephrology consulted by EDP. Will transfer to [facility] to facilitate dialysis in the morning.

## 2022-03-24 NOTE — ED Notes (Addendum)
Pt arrived from triage. AAOx4. Reports feeling sob and coughing since last night. Patient is a dialysis pt MWF. Endorses going to dialysis today, completing 3 1/2 hours. However, states she was not able to finish the last hour and 1/2 due to her not feeling well. Audible wheezes. Denies cp. States she did take BP meds. Pending edp eval

## 2022-03-24 NOTE — ED Notes (Signed)
Pt states they have hx of hypertension. Took her bp medication around 6 am this morning.

## 2022-03-24 NOTE — Discharge Instructions (Addendum)
Thank you for coming to [facility] Emergency Department.  You presented for shortness of breath and you were found to have low oxygen and fluid on the lungs.  You are leaving [place].  You were scheduled for admission to go to [facility] for dialysis emergently but you instead are choosing to go home to see your dogs.  Please go to your dialysis in the morning or come back to the emergency department as your oxygen was low.  You have a significant morbidity and mortality and risk of death as a result of leaving here today.  You expressed understanding. Please follow up with your primary care provider within 1 week.   Do not hesitate to return to the ED or call 911 if you experience: -Worsening symptoms -Chest pain, shortness of breath -Lightheadedness, passing out -Fevers/chills -Anything else that concerns you

## 2022-03-24 NOTE — Assessment & Plan Note (Signed)
-  secondary to volume overload and CHF exacerbation -dialysis with nephrology tomorrow -will check repeat Echo as below

## 2022-03-24 NOTE — ED Notes (Signed)
Tried to get blood but was unsuccessful 

## 2022-03-24 NOTE — Assessment & Plan Note (Signed)
Last echocardiogram in 2015 with EF of 45 to 50% with severe concentric hypertrophy and mildly dilated cavity size.  No systolic dysfunction and grade 2 diastolic dysfunction.

## 2022-03-24 NOTE — ED Notes (Signed)
Attempt to obtain blood work/IV. Unsuccesful

## 2022-03-24 NOTE — Progress Notes (Signed)
Consulted by ED to admit this pt with hx of ESRD for fluid overload.   Upon evaluation, pt is adamant that she wants to return home to care for her pets. States that she will return to [facility] ED for dialysis first thing in the morning and will come back to ER if her respiratory status worsens. Pt notably dyspneic even when speaking and I made her aware of this and my recommendations for her to be admitted for transfer to [facility] for dialysis but pt persisted in wanting to return home.   Risks of worsening respiratory failure and possible death were explained in detail to the patient and he/she verbally understood the risks of leaving AMA.   Pt decided to leave [place].   RN and ED physician notified and will assist in discharge.

## 2022-03-24 NOTE — ED Provider Notes (Signed)
[facility] Provider Note   CSN: [id number] Arrival date & time: 03/24/22  1659     History {Add pertinent medical, surgical, social history, OB history to HPI:1} Chief Complaint  Patient presents with   Shortness of Breath    [name] is a 47 y.o. female with ESRD on HD, HLD, HTN, dilated cardiomyopathy, menorrhagia, morbid obesity who presents with SOB.   Reports feeling sob and coughing since last night. Patient is a dialysis pt MWF. Endorses going to dialysis today, completing 3 1/2 hours. However, states she was not able to finish the last hour and 1/2 due to her not feeling well. Audible wheezes. Denies cp. States she did take BP meds. Pending edp eval ***   Shortness of Breath      Home Medications Prior to Admission medications   Medication Sig Start Date End Date Taking? Authorizing Provider  Aspirin-Caffeine (BAYER BACK & BODY) 500-32.5 MG TABS Take 2 tablets by mouth 2 (two) times daily as needed (pain.).    [provider]  AURYXIA 1 GM 210 MG(Fe) tablet Take 630 mg by mouth See admin instructions. Take 3 tablets (630 mg) by mouth with meals and snacks 01/19/21   [provider]  cinacalcet (SENSIPAR) 60 MG tablet Take 60 mg by mouth in the morning.    [provider]  megestrol (MEGACE) 20 MG tablet Take 1 tablet (20 mg total) by mouth daily. 02/09/22   Radene Gunning, MD  Na Sulfate-K Sulfate-Mg Sulf (SUPREP BOWEL PREP KIT) 17.5-3.13-1.6 GM/177ML SOLN Take 1 kit by mouth as directed. For colonoscopy prep Patient not taking: Reported on 02/09/2022 01/12/22   Mansouraty, Telford Nab., MD  ondansetron (ZOFRAN) 4 MG tablet Take 1 tablet (4 mg total) by mouth every 12 (twelve) hours. Patient not taking: Reported on 02/09/2022 01/12/22   Mansouraty, Telford Nab., MD  pantoprazole (PROTONIX) 40 MG tablet Take 1 tablet (40 mg total) by mouth daily. 01/12/22   Mansouraty, Telford Nab., MD  PROAIR HFA [phone]  Base) MCG/ACT inhaler Inhale 1-2 puffs into the lungs 3 (three) times daily as needed for wheezing or shortness of breath. For wheezing. 09/04/15   [provider]      Allergies    Pollen extract and Oxycodone-acetaminophen    Review of Systems   Review of Systems  Respiratory:  Positive for shortness of breath.    Review of systems {pos/neg:18640::"Negative","Positive"} for ***.  A 10 point review of systems was performed and is negative unless otherwise reported in HPI.  Physical Exam Updated Vital Signs BP (!) 154/91   Pulse (!) 111   Temp 98.2 F (36.8 C)   Resp (!) 21   Ht 5\' 4"  (1.626 m)   Wt (!) 139.3 kg   SpO2 97%   BMI 52.70 kg/m  Physical Exam General: Normal appearing {Desc; female/female:11659}, lying in bed.  HEENT: PERRLA, Sclera anicteric, MMM, trachea midline.  Cardiology: RRR, no murmurs/rubs/gallops. BL radial and DP pulses equal bilaterally.  Resp: Normal respiratory rate and effort. CTAB, no wheezes, rhonchi, crackles.  Abd: Soft, non-tender, non-distended. No rebound tenderness or guarding.  GU: Deferred. MSK: No peripheral edema or signs of trauma. Extremities without deformity or TTP. No cyanosis or clubbing. Skin: warm, dry. No rashes or lesions. Back: No CVA tenderness Neuro: A&Ox4, CNs II-XII grossly intact. MAEs. Sensation grossly intact.  Psych: Normal mood and affect.   ED Results / Procedures / Treatments   Labs (all labs  ordered are listed, but only abnormal results are displayed) Labs Reviewed  BASIC METABOLIC PANEL - Abnormal; Notable for the following components:      Result Value   Glucose, Bld 105 (*)    BUN 67 (*)    Creatinine, Ser 14.85 (*)    Calcium 8.8 (*)    GFR, Estimated 3 (*)    All other components within normal limits  CBC WITH DIFFERENTIAL/PLATELET - Abnormal; Notable for the following components:   WBC 17.2 (*)    RBC 3.10 (*)    Hemoglobin 9.9 (*)    HCT 31.1 (*)    MCV 100.3 (*)    Neutro Abs 14.3 (*)     Monocytes Absolute 1.2 (*)    Abs Immature Granulocytes 0.10 (*)    All other components within normal limits  BRAIN NATRIURETIC PEPTIDE - Abnormal; Notable for the following components:   B Natriuretic Peptide 1,484.7 (*)    All other components within normal limits  RESP PANEL BY RT-PCR (RSV, FLU A&B, COVID)  [id number]    EKG EKG Interpretation  Date/Time:  Wednesday March 24 2022 17:25:42 EDT Ventricular Rate:  105 PR Interval:  141 QRS Duration: 107 QT Interval:  398 QTC Calculation: 527 R Axis:   2 Text Interpretation: Sinus tachycardia Prolonged QT interval Confirmed by Cindee Lame [phone]) on 03/24/2022 7:18:41 PM  Radiology DG Chest 2 View  Result Date: 03/24/2022 CLINICAL DATA:  Shortness of breath. EXAM: CHEST - 2 VIEW COMPARISON:  X-ray 12/04/2018 FINDINGS: Right IJ large-bore catheter in place with tip overlying the right atrium. Enlarged cardiopericardial silhouette with vascular congestion and trace edema. No pneumothorax or effusion. Films are under penetrated. Overlapping cardiac leads. IMPRESSION: Enlarged cardiopericardial silhouette with vascular congestion and mild edema. Right IJ double-lumen catheter. Electronically Signed   By: Jill Side M.D.   On: 03/24/2022 17:43    Procedures Procedures  {Document cardiac monitor, telemetry assessment procedure when appropriate:1}  Medications Ordered in ED Medications  ipratropium-albuterol (DUONEB) 0.5-2.5 (3) MG/3ML nebulizer solution 3 mL (3 mLs Nebulization Given 03/24/22 2022)  methylPREDNISolone sodium succinate (SOLU-MEDROL) 125 mg/2 mL injection 125 mg (125 mg Intravenous Given 03/24/22 2001)  magnesium sulfate IVPB 2 g 50 mL (0 g Intravenous Stopped 03/24/22 2101)  nitroGLYCERIN (NITROSTAT) SL tablet 0.4 mg (0.4 mg Sublingual Given 03/24/22 2046)    ED Course/ Medical Decision Making/ A&P                          Medical Decision Making Amount and/or Complexity of Data Reviewed Labs: ordered.  Decision-making details documented in ED Course. Radiology:  Decision-making details documented in ED Course.  Risk Prescription drug management.    This patient presents to the ED for concern of ***, this involves an extensive number of treatment options, and is a complaint that carries with it a high risk of complications and morbidity.  I considered the following differential and admission for this acute, potentially life threatening condition.   MDM:    ***  Clinical Course as of 03/24/22 2339  Wed Mar 24, 2022  1917 DG Chest 2 View FINDINGS: Right IJ large-bore catheter in place with tip overlying the right atrium. Enlarged cardiopericardial silhouette with vascular congestion and trace edema. No pneumothorax or effusion. Films are under penetrated. Overlapping cardiac leads.  IMPRESSION: Enlarged cardiopericardial silhouette with vascular congestion and mild edema. Right IJ double-lumen catheter.   [HN]  2037 Patient reevaluated after duonebs/solumedrol. She feels  milldy improved but still has increased WOB and requiring 2L [place].  [HN]  2038 Will try SL nitro for pulm edema [HN]  2153 Patient reevaluated, WOB mildly improved still after SL nitro. Patient states she feels improved. She is still requiring 2L [place] and with mild increased WOB, believe she should be admitted for dialysis. [HN]  2153 B Natriuretic Peptide(!): 1,484.7 [HN]  2154 Will consult to nephrology [HN]  2213 D/w nephrology, plan for admission and transfer [facility] in the AM. Consulted to hospitalist. [HN]  2213 Resp panel by RT-PCR (RSV, Flu A&B, Covid) Anterior Nasal Swab neg [HN]  2338 Patient was admitted to the hospital with plans to go to [facility] for dialysis in the morning.  Patient was requiring 2 L nasal cannula for hypoxia and increased work of breathing.  Patient relates that she needs to leave AMA in order to go take care of her dogs at home.  I discussed with patient that she is  requiring oxygen and that she will have low oxygen and when she is removed from it.  She is also tachycardic.  I discussed with patient that she faces severe morbidity mortality as result of leaving up to including death, brain damage.  Patient reports understanding and states that she needs to go home.  She states she will call her dialysis center in the morning to have them fit her in or go to the emergency department once again in the morning for dialysis.  I encourage patient to return as soon as possible. [HN]    Clinical Course User Index [HN] Audley Hose, MD    Labs: I Ordered, and personally interpreted labs.  The pertinent results include:  ***  Imaging Studies ordered: I ordered imaging studies including *** I independently visualized and interpreted imaging. I agree with the radiologist interpretation  Additional history obtained from ***.  External records from outside source obtained and reviewed including ***  Cardiac Monitoring: The patient was maintained on a cardiac monitor.  I personally viewed and interpreted the cardiac monitored which showed an underlying rhythm of: ***  Reevaluation: After the interventions noted above, I reevaluated the patient and found that they have :{resolved/improved/worsened:23923::"improved"}  Social Determinants of Health: ***  Disposition:  ***  Co morbidities that complicate the patient evaluation  Past Medical History:  Diagnosis Date   Anemia    Anxiety    pt not aware of this   Arthritis    Breast mass 09/01/2016   Targeted ultrasound is performed, showing a mass in the left breast at 11 o'clock, 1 cm from the nipple measuring 6.4 by 5.0 cm. The mass is heterogeneous and may contain macroscopic fat.   IMPRESSION: The mass in the left breast is probably benign, possibly a Hamartoma.  RECOMMENDATION: Recommend six-month follow-up mammogram of the probably benign left breast mass. If necessary, an ultrasound co   CHF  (congestive heart failure) ([place])    hosp. 2011   Chronic kidney disease    Mon Wed Friday   Dilated cardiomyopathy ([place])    Encounter for blood transfusion    ESRD (end stage renal disease) ([place])    MWF - [place] [address] [place]   Family history of adverse reaction to anesthesia     " Aunt has a tracheostomy and kept coughing after colonoscopy "   Foot fracture    Gout    no flare up for several yrs   Headache    Heart murmur  as a child, no problems as an adult   Hemodialysis patient ([place])    History of blood transfusion    x 2   History of pneumonia    Hyperlipidemia    Hypertension    Menorrhagia    Morbid obesity ([place])    Pneumonia    hosp. 2010   Shortness of breath    HX CHL - occasional. 04/09/21 patient sits down and shortness of breath susides.   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  Ordered Dose: 1 to 125 mg TDD; convert to: TDD q24h.  Ordered Dose: 126 to 250 mg TDD; convert to: TDD div q12h.  Ordered Dose: >250 mg TDD; DAW.   magnesium sulfate IVPB 2 g 50 mL   nitroGLYCERIN (NITROSTAT) SL tablet 0.4 mg    I have reviewed the patients home medicines and have made adjustments as needed  Problem List / ED Course: Problem List Items Addressed This Visit   None Visit Diagnoses     Respiratory distress    -  Primary   Acute pulmonary edema (HCC)                {Document critical care time when appropriate:1} {Document review of labs and clinical decision tools ie heart score,  Chads2Vasc2 etc:1}  {Document your independent review of radiology images, and any outside records:1} {Document your discussion with family members, caretakers, and with consultants:1} {Document social determinants of health affecting pt's care:1} {Document your decision making why or why not admission, treatments were needed:1}  This note was created using dictation software, which may contain spelling or grammatical errors.

## 2022-03-24 NOTE — ED Provider Triage Note (Signed)
Emergency Medicine Provider Triage Evaluation Note  [name] , a 47 y.o. female  was evaluated in triage.  Pt complains of shortness of breath and cough/  Review of Systems  Positive: Sob, cough Negative: fever  Physical Exam  BP (!) 163/101   Pulse (!) 106   Temp 98.2 F (36.8 C) (Oral)   Resp 20   Ht 5\' 4"  (1.626 m)   Wt (!) 139.3 kg   SpO2 95%   BMI 52.70 kg/m  Gen:   Awake, no distress   Resp:  Normal effort  MSK:   Moves extremities without difficulty  Other:    Medical Decision Making  Medically screening exam initiated at 5:31 PM.  Appropriate orders placed.  [name] was informed that the remainder of the evaluation will be completed by another provider, this initial triage assessment does not replace that evaluation, and the importance of remaining in the ED until their evaluation is complete.    Abigail Fitting, PA-C 03/31/22 2048

## 2022-03-24 NOTE — Assessment & Plan Note (Signed)
Hgb at 9.9 with baseline around 10-11. Could be due to fluid overload. Monitor repeat after dialysis tomorrow.

## 2022-03-24 NOTE — ED Notes (Signed)
Pt discussed leaving AMA with provider and has expressed understanding and wishes to be discharged.

## 2022-03-24 NOTE — ED Triage Notes (Signed)
Patient c/o shortness of breath, cough and phlegm in throat since yesterday. Patient reports having 3.5 hours of dialysis this morning.

## 2022-05-31 ENCOUNTER — Emergency Department (HOSPITAL_COMMUNITY): Payer: 59

## 2022-05-31 ENCOUNTER — Emergency Department (HOSPITAL_COMMUNITY)
Admission: EM | Admit: 2022-05-31 | Discharge: 2022-05-31 | Disposition: A | Discharge status: Home / Self Care | Payer: 59 | Attending: Emergency Medicine | Admitting: Emergency Medicine

## 2022-05-31 ENCOUNTER — Encounter (HOSPITAL_COMMUNITY): Payer: Self-pay

## 2022-05-31 DIAGNOSIS — I132 Hypertensive heart and chronic kidney disease with heart failure and with stage 5 chronic kidney disease, or end stage renal disease: Secondary | ICD-10-CM | POA: Diagnosis not present

## 2022-05-31 DIAGNOSIS — Z992 Dependence on renal dialysis: Secondary | ICD-10-CM | POA: Insufficient documentation

## 2022-05-31 DIAGNOSIS — I509 Heart failure, unspecified: Secondary | ICD-10-CM | POA: Diagnosis not present

## 2022-05-31 DIAGNOSIS — N186 End stage renal disease: Secondary | ICD-10-CM | POA: Diagnosis not present

## 2022-05-31 DIAGNOSIS — E669 Obesity, unspecified: Secondary | ICD-10-CM | POA: Diagnosis not present

## 2022-05-31 DIAGNOSIS — R1012 Left upper quadrant pain: Secondary | ICD-10-CM | POA: Insufficient documentation

## 2022-05-31 LAB — URINALYSIS, ROUTINE W REFLEX MICROSCOPIC
Bilirubin Urine: NEGATIVE
Glucose, UA: NEGATIVE mg/dL
Ketones, ur: NEGATIVE mg/dL
Leukocytes,Ua: NEGATIVE
Nitrite: NEGATIVE
Protein, ur: 100 mg/dL — AB
Specific Gravity, Urine: 1.005 (ref 1.005–1.030)
pH: 8 (ref 5.0–8.0)

## 2022-05-31 LAB — COMPREHENSIVE METABOLIC PANEL
ALT: 10 U/L (ref 0–44)
AST: 14 U/L — ABNORMAL LOW (ref 15–41)
Albumin: 3.6 g/dL (ref 3.5–5.0)
Alkaline Phosphatase: 36 U/L — ABNORMAL LOW (ref 38–126)
Anion gap: 13 (ref 5–15)
BUN: 23 mg/dL — ABNORMAL HIGH (ref 6–20)
CO2: 30 mmol/L (ref 22–32)
Calcium: 8.8 mg/dL — ABNORMAL LOW (ref 8.9–10.3)
Chloride: 96 mmol/L — ABNORMAL LOW (ref 98–111)
Creatinine, Ser: 7.45 mg/dL — ABNORMAL HIGH (ref 0.44–1.00)
GFR, Estimated: 6 mL/min — ABNORMAL LOW (ref >60)
Glucose, Bld: 86 mg/dL (ref 70–99)
Potassium: 2.9 mmol/L — ABNORMAL LOW (ref 3.5–5.1)
Sodium: 139 mmol/L (ref 135–145)
Total Bilirubin: 0.5 mg/dL (ref 0.3–1.2)
Total Protein: 7.9 g/dL (ref 6.5–8.1)

## 2022-05-31 LAB — CBC
HCT: 30.8 % — ABNORMAL LOW (ref 36.0–46.0)
Hemoglobin: 10.4 g/dL — ABNORMAL LOW (ref 12.0–15.0)
MCH: 32.5 pg (ref 26.0–34.0)
MCHC: 33.8 g/dL (ref 30.0–36.0)
MCV: 96.3 fL (ref 80.0–100.0)
Platelets: 228 10*3/uL (ref 150–400)
RBC: 3.2 MIL/uL — ABNORMAL LOW (ref 3.87–5.11)
RDW: 13.7 % (ref 11.5–15.5)
WBC: 10.5 10*3/uL (ref 4.0–10.5)
nRBC: 0 % (ref 0.0–0.2)

## 2022-05-31 LAB — TROPONIN I (HIGH SENSITIVITY)
Troponin I (High Sensitivity): 69 ng/L — ABNORMAL HIGH (ref <18)
Troponin I (High Sensitivity): 68 ng/L — ABNORMAL HIGH (ref <18)

## 2022-05-31 LAB — I-STAT BETA HCG BLOOD, ED (MC, WL, AP ONLY): I-stat hCG, quantitative: 6.2 m[IU]/mL — ABNORMAL HIGH (ref <5)

## 2022-05-31 LAB — PREGNANCY, URINE: Preg Test, Ur: NEGATIVE

## 2022-05-31 LAB — LIPASE, BLOOD: Lipase: 148 U/L — ABNORMAL HIGH (ref 11–51)

## 2022-05-31 MED ORDER — POTASSIUM CHLORIDE CRYS ER 20 MEQ PO TBCR
40.0000 meq | EXTENDED_RELEASE_TABLET | Freq: Once | ORAL | Status: AC
  Administered 2022-05-31: 40 meq via ORAL
  Filled 2022-05-31: qty 2

## 2022-05-31 NOTE — ED Provider Notes (Signed)
EMERGENCY DEPARTMENT AT [facility] Provider Note   CSN: [id number] Arrival date & time: 05/31/22  1046     History  Chief Complaint  Patient presents with   Abdominal Pain    [name] is a 47 y.o. female with history of CHF (echo 45 to 50%, 2015), ESRD on hemodialysis MWF, hyperlipidemia, hypertension, obesity who presents the emergency department complaining of intermittent pain under the left breast and nausea.  This initially started when she was at dialysis on Friday.  She states it only lasted about a minute, before it resolved.  She describes it as feeling like something is moving through her stomach, and when gets to the left upper side she has sharp stabbing pain.  This happened again the following day when she was at a family member's house, and she felt nauseous at that time.  Happened distantly today during dialysis.  No pain at this current moment.  She has been eating and drinking normally.  States that dialysis has been going well, no missed treatments.     Abdominal Pain Associated symptoms: chest pain and nausea        Home Medications Prior to Admission medications   Medication Sig Start Date End Date Taking? Authorizing Provider  Aspirin-Caffeine (BAYER BACK & BODY) 500-32.5 MG TABS Take 2 tablets by mouth 2 (two) times daily as needed (pain.).    [provider]  AURYXIA 1 GM 210 MG(Fe) tablet Take 630 mg by mouth See admin instructions. Take 3 tablets (630 mg) by mouth with meals and snacks 01/19/21   [provider]  cinacalcet (SENSIPAR) 60 MG tablet Take 60 mg by mouth in the morning.    [provider]  megestrol (MEGACE) 20 MG tablet Take 1 tablet (20 mg total) by mouth daily. 02/09/22   Milas Hock, MD  Na Sulfate-K Sulfate-Mg Sulf (SUPREP BOWEL PREP KIT) 17.5-3.13-1.6 GM/177ML SOLN Take 1 kit by mouth as directed. For colonoscopy prep Patient not taking: Reported on 02/09/2022 01/12/22   Mansouraty,  Netty Starring., MD  ondansetron (ZOFRAN) 4 MG tablet Take 1 tablet (4 mg total) by mouth every 12 (twelve) hours. Patient not taking: Reported on 02/09/2022 01/12/22   Mansouraty, Netty Starring., MD  pantoprazole (PROTONIX) 40 MG tablet Take 1 tablet (40 mg total) by mouth daily. 01/12/22   Mansouraty, Netty Starring., MD  PROAIR HFA 108 [phone] Base) MCG/ACT inhaler Inhale 1-2 puffs into the lungs 3 (three) times daily as needed for wheezing or shortness of breath. For wheezing. 09/04/15   [provider]      Allergies    Pollen extract and Oxycodone-acetaminophen    Review of Systems   Review of Systems  Cardiovascular:  Positive for chest pain.  Gastrointestinal:  Positive for abdominal pain and nausea.  All other systems reviewed and are negative.   Physical Exam Updated Vital Signs BP 117/84   Pulse 81   Temp 98.5 F (36.9 C) (Oral)   Resp 17   Ht 5\' 9"  (1.753 m)   Wt (!) 142.9 kg   SpO2 99%   BMI 46.52 kg/m  Physical Exam Vitals and nursing note reviewed.  Constitutional:      Appearance: Normal appearance. She is obese.  HENT:     Head: Normocephalic and atraumatic.  Eyes:     Conjunctiva/sclera: Conjunctivae normal.  Cardiovascular:     Rate and Rhythm: Normal rate and regular rhythm.  Pulmonary:     Effort: Pulmonary effort is  normal. No respiratory distress.     Breath sounds: Normal breath sounds.  Abdominal:     General: There is no distension.     Palpations: Abdomen is soft.     Tenderness: There is no abdominal tenderness.  Skin:    General: Skin is warm and dry.  Neurological:     General: No focal deficit present.     Mental Status: She is alert.     ED Results / Procedures / Treatments   Labs (all labs ordered are listed, but only abnormal results are displayed) Labs Reviewed  CBC - Abnormal; Notable for the following components:      Result Value   RBC 3.20 (*)    Hemoglobin 10.4 (*)    HCT 30.8 (*)    All other components within normal limits   URINALYSIS, ROUTINE W REFLEX MICROSCOPIC - Abnormal; Notable for the following components:   Hgb urine dipstick LARGE (*)    Protein, ur 100 (*)    Bacteria, UA RARE (*)    All other components within normal limits  COMPREHENSIVE METABOLIC PANEL - Abnormal; Notable for the following components:   Potassium 2.9 (*)    Chloride 96 (*)    BUN 23 (*)    Creatinine, Ser 7.45 (*)    Calcium 8.8 (*)    AST 14 (*)    Alkaline Phosphatase 36 (*)    GFR, Estimated 6 (*)    All other components within normal limits  LIPASE, BLOOD - Abnormal; Notable for the following components:   Lipase 148 (*)    All other components within normal limits  I-STAT BETA HCG BLOOD, ED ([facility], [facility], [facility] ONLY) - Abnormal; Notable for the following components:   I-stat hCG, quantitative 6.2 (*)    All other components within normal limits  TROPONIN I (HIGH SENSITIVITY) - Abnormal; Notable for the following components:   Troponin I (High Sensitivity) 69 (*)    All other components within normal limits  TROPONIN I (HIGH SENSITIVITY) - Abnormal; Notable for the following components:   Troponin I (High Sensitivity) 68 (*)    All other components within normal limits  PREGNANCY, URINE    EKG EKG Interpretation  Date/Time:  Monday May 31 2022 12:58:38 EDT Ventricular Rate:  86 PR Interval:  147 QRS Duration: 118 QT Interval:  435 QTC Calculation: 521 R Axis:   18 Text Interpretation: Sinus rhythm LVH with secondary repolarization abnormality Prolonged QT interval Confirmed by Margarita Grizzle [phone]) on 05/31/2022 2:48:42 PM  Radiology CT ABDOMEN PELVIS WO CONTRAST  Result Date: 05/31/2022 CLINICAL DATA:  Left-sided abdominal pain and nausea for several days. On dialysis. EXAM: CT ABDOMEN AND PELVIS WITHOUT CONTRAST TECHNIQUE: Multidetector CT imaging of the abdomen and pelvis was performed following the standard protocol without IV contrast. RADIATION DOSE REDUCTION: This exam was performed according to the  departmental dose-optimization program which includes automated exposure control, adjustment of the mA and/or kV according to patient size and/or use of iterative reconstruction technique. COMPARISON:  None Available. FINDINGS: Lower chest: No acute findings. Hepatobiliary: No mass visualized on this unenhanced exam. 0.7 cm calcified gallstone noted, however there is no evidence of cholecystitis or biliary ductal dilatation. Pancreas: No mass or inflammatory process visualized on this unenhanced exam. Spleen:  Within normal limits in size. Adrenals/Urinary tract: Diffuse bilateral renal parenchymal atrophy is seen, consistent with end-stage renal disease. Small fluid attenuation cysts noted bilaterally (No followup imaging is recommended). A few tiny less than 5 mm  renal calculi are seen bilaterally. No evidence of ureteral calculi or hydronephrosis. Unremarkable unopacified urinary bladder. Stomach/Bowel: No evidence of obstruction, inflammatory process, or abnormal fluid collections. Normal appendix visualized. Vascular/Lymphatic: No pathologically enlarged lymph nodes identified. No evidence of abdominal aortic aneurysm. Aortic atherosclerotic calcification incidentally noted. Reproductive:  No mass or other significant abnormality. Other:  None. Musculoskeletal:  No suspicious bone lesions identified. IMPRESSION: Bilateral nephrolithiasis. No evidence of ureteral calculi, hydronephrosis, or other acute findings. Bilateral diffuse renal atrophy, consistent with end-stage renal disease. Cholelithiasis. No radiographic evidence of cholecystitis. Electronically Signed   By: Danae Orleans M.D.   On: 05/31/2022 14:30   DG Chest 2 View  Result Date: 05/31/2022 CLINICAL DATA:  Left-sided chest pain. EXAM: CHEST - 2 VIEW COMPARISON:  Chest radiographs 03/24/2022 and 12/04/2018 FINDINGS: Cardiac silhouette is again mildly enlarged. Mediastinal contours are within limits. Mild-to-moderate atherosclerotic calcifications  within the aortic arch. Interval removal of right internal jugular dual-lumen central venous catheter. There is cephalization of the pulmonary vasculature. Interval decrease in bilateral interstitial thickening. Resolution of the prior right lower lung heterogeneous airspace opacity. The lungs are more similar to 12/04/2018 radiographs, with resolution of the prior interstitial pulmonary edema seen on most recent 03/24/2022 radiographs. No pneumothorax. No acute skeletal abnormality. IMPRESSION: 1. Cardiomegaly with pulmonary vascular congestion. No overt pulmonary edema. 2. Resolution of the prior right lower lung heterogeneous airspace opacity. The lungs are more similar to 12/04/2018 baseline radiographs, there is resolution of the pulmonary edema seen on 03/24/2022. Electronically Signed   By: Neita Garnet M.D.   On: 05/31/2022 11:58    Procedures Procedures    Medications Ordered in ED Medications  potassium chloride SA (KLOR-CON M) CR tablet 40 mEq (40 mEq Oral Given 05/31/22 1407)    ED Course/ Medical Decision Making/ A&P             HEART Score: 4                Medical Decision Making Amount and/or Complexity of Data Reviewed Labs: ordered. Radiology: ordered.  This patient is a 47 y.o. female  who presents to the ED for concern of chest and abdominal pain.   Differential diagnoses prior to evaluation: The emergent differential diagnosis includes, but is not limited to,  ACS, pericarditis, myocarditis, aortic dissection, PE, pneumothorax, GERD, pancreatitis, gastroenteritis. This is not an exhaustive differential.   Past Medical History / Co-morbidities / Social History: CHF (echo 45 to 50%, 2015), ESRD on hemodialysis MWF, hyperlipidemia, hypertension, obesity   Additional history: Chart reviewed. Pertinent results include: Reviewed ER visit from 3/20 in which patient presented in respiratory distress, she was found to have pulmonary edema and the plan was for admission.  She  left against medical advice at that time.  Physical Exam: Physical exam performed. The pertinent findings include: Mildly hypertensive, otherwise normal vital signs.  Patient laying in exam bed comfortably in no acute distress.  Abdomen soft and nontender.  Extremities with soft compartments, no pitting edema.  Lab Tests/Imaging studies: I personally interpreted labs/imaging and the pertinent results include: No leukocytosis, hemoglobin stable.  Potassium 2.9, otherwise CMP at baseline.  Urinalysis with large hemoglobin, no infection.  Lipase 148.  Initial troponin 69, delta troponin 68.  Chest x-ray with cardiomegaly and pulmonary vascular congestion, no pulmonary edema, resolution of prior right lower lung airspace opacity.  CT abdomen pelvis without bilateral nephrolithiasis, bilateral diffuse renal atrophy, and cholelithiasis without evidence of cholecystitis.  I agree with the radiologist interpretation.  Cardiac monitoring: EKG obtained and interpreted by my attending physician which shows: sinus rhythm with prolonged QTC of 521   Medications: I ordered medication including potassium replacement.  I have reviewed the patients home medicines and have made adjustments as needed.   Disposition: After consideration of the diagnostic results and the patients response to treatment, I feel that emergency department workup does not suggest an emergent condition requiring admission or immediate intervention beyond what has been performed at this time. Patient is to be discharged with recommendation to follow up with PCP in regards to today's hospital visit. Chest pain is not likely of cardiac or pulmonary etiology d/t presentation, Well's criteria for PE negative, VSS, no tracheal deviation, no JVD or new murmur, RRR, breath sounds equal bilaterally, EKG without acute abnormalities, negative troponin, and negative CXR. Heart score of 4. Pt has been advised to return to the ED if CP becomes  exertional, associated with diaphoresis or nausea, radiates to left jaw/arm, worsens or becomes concerning in any way. Not clinically consistent with pancreatitis and no CT evidence of such. Encouraged follow up with PCP for recheck of lipase and GI follow up, which patient plans to do anyways. Pt appears reliable for follow up and is agreeable to discharge.   I discussed this case with my attending physician Dr. Rosalia Hammers who cosigned this note including patient's presenting symptoms, physical exam, and planned diagnostics and interventions. Attending physician stated agreement with plan or made changes to plan which were implemented.    Final Clinical Impression(s) / ED Diagnoses Final diagnoses:  Left upper quadrant abdominal pain    Rx / DC Orders ED Discharge Orders     None      Portions of this report may have been transcribed using voice recognition software. Every effort was made to ensure accuracy; however, inadvertent computerized transcription errors may be present.    Jeanella Flattery 05/31/22 1510    Margarita Grizzle, MD 06/02/22 [phone]

## 2022-05-31 NOTE — ED Triage Notes (Signed)
Pt presents with c/o intermittent pain under left breast since dialysis Friday, and nausea. One bad episode Saturday. Endorses issues with gas. Feels like 'something is moving in my stomach and then when it gets right here it hurts'. PO intake normal.

## 2022-05-31 NOTE — Discharge Instructions (Signed)
You are seen emergency department today for abdominal pain.  As we discussed your workup was reassuring today.  The blood test that looks at your pancreas function was elevated, but based on what we talked about, I do not think that you have pancreatitis.  I do want you to get this blood test, called lipase, rechecked by your primary doctor later this week or next.  Start taking your Protonix daily, and follow-up with the gastroenterologist as needed.  Continue to monitor how you're doing and return to the ER for new or worsening symptoms such as chest pain that is persistent, associated with shortness of breath, sweating, or passing out.

## 2022-08-09 ENCOUNTER — Encounter (HOSPITAL_COMMUNITY): Payer: Self-pay

## 2022-08-10 ENCOUNTER — Ambulatory Visit (INDEPENDENT_AMBULATORY_CARE_PROVIDER_SITE_OTHER): Payer: 59 | Admitting: Psychology

## 2022-08-10 DIAGNOSIS — F321 Major depressive disorder, single episode, moderate: Secondary | ICD-10-CM

## 2022-08-19 ENCOUNTER — Other Ambulatory Visit: Payer: 59 | Admitting: Psychology

## 2022-08-26 ENCOUNTER — Ambulatory Visit (INDEPENDENT_AMBULATORY_CARE_PROVIDER_SITE_OTHER): Payer: 59 | Admitting: Psychology

## 2022-08-26 DIAGNOSIS — F321 Major depressive disorder, single episode, moderate: Secondary | ICD-10-CM | POA: Diagnosis not present

## 2022-08-31 ENCOUNTER — Ambulatory Visit (INDEPENDENT_AMBULATORY_CARE_PROVIDER_SITE_OTHER): Payer: 59 | Admitting: Psychology

## 2022-08-31 DIAGNOSIS — F321 Major depressive disorder, single episode, moderate: Secondary | ICD-10-CM | POA: Diagnosis not present

## 2022-09-09 ENCOUNTER — Other Ambulatory Visit: Payer: 59 | Admitting: Psychology

## 2022-09-14 ENCOUNTER — Ambulatory Visit (INDEPENDENT_AMBULATORY_CARE_PROVIDER_SITE_OTHER): Payer: 59 | Admitting: Psychology

## 2022-09-14 DIAGNOSIS — F321 Major depressive disorder, single episode, moderate: Secondary | ICD-10-CM | POA: Diagnosis not present

## 2022-09-21 ENCOUNTER — Other Ambulatory Visit: Payer: Self-pay | Admitting: General Practice

## 2022-09-21 ENCOUNTER — Other Ambulatory Visit: Payer: 59 | Admitting: Psychology

## 2022-09-21 ENCOUNTER — Telehealth: Payer: Self-pay | Admitting: General Practice

## 2022-09-21 DIAGNOSIS — N921 Excessive and frequent menstruation with irregular cycle: Secondary | ICD-10-CM

## 2022-09-21 MED ORDER — MEGESTROL ACETATE 20 MG PO TABS: 20.0000 mg | ORAL_TABLET | Freq: Every day | ORAL | 1 refills | Status: DC

## 2022-09-21 NOTE — Telephone Encounter (Signed)
Patient called and left message on nurse voicemail line stating she could not get an appt until November but needs a refill on her Megace. Will route to Dr Para March for approval. Patient currently does not have appt scheduled but will need one.

## 2022-09-23 NOTE — Telephone Encounter (Signed)
Pt notified that Megace refill has been sent to her pharmacy.  Pt stated that she picked it up yesterday.  Pt did not have any other questions or concerns.    Leonette Nutting  09/23/22

## 2022-09-30 ENCOUNTER — Ambulatory Visit (INDEPENDENT_AMBULATORY_CARE_PROVIDER_SITE_OTHER): Payer: 59 | Admitting: Psychology

## 2022-09-30 DIAGNOSIS — F321 Major depressive disorder, single episode, moderate: Secondary | ICD-10-CM

## 2022-10-07 ENCOUNTER — Ambulatory Visit (INDEPENDENT_AMBULATORY_CARE_PROVIDER_SITE_OTHER): Payer: 59 | Admitting: Psychology

## 2022-10-07 DIAGNOSIS — F321 Major depressive disorder, single episode, moderate: Secondary | ICD-10-CM | POA: Diagnosis not present

## 2022-10-14 ENCOUNTER — Ambulatory Visit
Admission: RE | Admit: 2022-10-14 | Discharge: 2022-10-14 | Disposition: A | Discharge status: Home / Self Care | Payer: 59 | Source: Ambulatory Visit | Attending: Internal Medicine | Admitting: Internal Medicine

## 2022-10-21 ENCOUNTER — Ambulatory Visit (INDEPENDENT_AMBULATORY_CARE_PROVIDER_SITE_OTHER): Payer: 59 | Admitting: Psychology

## 2022-10-21 DIAGNOSIS — F321 Major depressive disorder, single episode, moderate: Secondary | ICD-10-CM

## 2022-10-28 ENCOUNTER — Other Ambulatory Visit: Payer: 59 | Admitting: Psychology

## 2022-10-31 NOTE — Progress Notes (Unsigned)
Comprehensive Clinical Assessment (CCA) Note 08/10/2022 [name] [id number]  Chief Complaint: Moderate depressive disorder Visit Diagnosis: Moderate depressive disorder   CCA Screening, Triage and Referral (STR)  Patient Reported Information How did you hear about [place]? Neighbor Referral name: Boyce Medici Referral phone number:   Whom do you see for routine medical problems? [name] Practice/Facility Name: Internal Medicine Practice/Facility Phone Number: [phone] Name of Contact: No data recorded Contact Number: No data recorded Contact Fax Number: No data recorded Prescriber Name: No data recorded Prescriber Address (if known): No data recorded  What Is the Reason for Your Visit/Call Today? CCA, Ist therapy session How Long Has This Been Causing You Problems? Several months What Do You Feel Would Help You the Most Today? Talk to someone, get feelings out  Have You Recently Been in Any Inpatient Treatment (Hospital/Detox/Crisis Center/28-Day Program)? No Name/Location of Program/Hospital:No data recorded How Long Were You There? No data recorded When Were You Discharged? No data recorded  Have You Ever Received Services From [facility] Before? Yes Who Do You See at [facility]? Hospital, doctors  Have You Recently Had Any Thoughts About Hurting Yourself? No Are You Planning to Commit Suicide/Harm Yourself At This time? No  Have you Recently Had Thoughts About Hurting Someone [name]? No Explanation: No data recorded  Have You Used Any Alcohol or Drugs in the Past 24 Hours? No How Long Ago Did You Use Drugs or Alcohol?  What Did You Use and How Much? No data recorded  Do You Currently Have a Therapist/Psychiatrist? Yes Name of Therapist/Psychiatrist: Roslynn Amble, LCSWA  Have You Been Recently Discharged From Any Office Practice or Programs? No Explanation of Discharge From Practice/Program: No data recorded    CCA Screening  Triage Referral Assessment Type of Contact: In person Is this Initial or Reassessment? Initial  Date Telepsych consult ordered in [facility]: 08/10/2022 Time Telepsych consult ordered in [facility]: 12:00 p.m.  Patient Reported Information Reviewed?  Patient Left Without Being Seen?  Reason for Not Completing Assessment:   Collateral Involvement: No data recorded  Does Patient Have a Court Appointed Legal Guardian? No Name and Contact of Legal Guardian: No data recorded If Minor and Not Living with Parent(s), Who has Custody? No data recorded Is CPS involved or ever been involved? No data recorded Is APS involved or ever been involved? No  Patient Determined To Be At Risk for Harm To Self or Others Based on Review of Patient Reported Information or Presenting Complaint? No Method: N/A Availability of Means: No data recorded Intent: No data recorded Notification Required: No data recorded Additional Information for Danger to Others Potential: No data recorded Additional Comments for Danger to Others Potential: No data recorded Are There Guns or Other Weapons in Your Home? No data recorded Types of Guns/Weapons: No data recorded Are These Weapons Safely Secured?                            No data recorded Who Could Verify You Are Able To Have These Secured: No data recorded Do You Have any Outstanding Charges, Pending Court Dates, Parole/Probation? No data recorded Contacted To Inform of Risk of Harm To Self or Others: No data recorded  Location of Assessment: [facility]  Does Patient Present under Involuntary Commitment? No IVC Papers Initial File Date: No  [place] of Residence: [name]  Patient Currently Receiving the Following Services: No data recorded  Determination of Need: No data  recorded  Options For Referral: No data recorded    CCA Biopsychosocial Intake/Chief Complaint:  Moderate depressive disorder Current Symptoms/Problems: lonely, sometimes feels down & depressed  Patient  Reported Schizophrenia/Schizoaffective Diagnosis in Past: None  Strengths: Smart, hard-working, reliable, trustworthy, independent Preferences: stability, support, independence Abilities: Works hard, emotional intelligence, adaptable, patient  Type of Services Patient Feels are Needed: weekly therapy  Initial Clinical Notes/Concerns: Client seems well adjusted. Looking forward to a therapeutic relationship, this is really something that the client needs.  Mental Health Symptoms Depression:  Yes  Duration of Depressive symptoms: Months  Mania:  None  Anxiety:   Mild  Psychosis:  None  Duration of Psychotic symptoms: None  Trauma:  Yes  Obsessions:  None  Compulsions:  None  Inattention:  None  Hyperactivity/Impulsivity:  None  Oppositional/Defiant Behaviors:  None  Emotional Irregularity:  Normal range  Other Mood/Personality Symptoms:  None   Mental Status Exam Appearance and self-care  Stature: 5 foot 9 inches  Weight: 313 pounds  Clothing:  clean  Grooming:  well groomed  Cosmetic use:  none  Posture/gait:  normal range  Motor activity:  normal range  Sensorium  Attention:  normal range  Concentration:  normal range  Orientation:  normal range  Recall/memory:  normal range  Affect and Mood  Affect:  Pleasant  Mood:  Pleasant  Relating  Eye contact:  normal range  Facial expression:  normal range  Attitude toward examiner:  Pleasant  Thought and Language  Speech flow: normal range  Thought content:  normal range  Preoccupation:  normal range  Hallucinations:  none  Organization:  normal range  [employer] of Knowledge:  well versed  Intelligence:  normal range  Abstraction:  normal range  Judgement:  normal range  Reality Testing:  normal range  Insight:  normal range  Decision Making:  normal range  Social Functioning  Social Maturity:  normal range  Social Judgement:  normal range  Stress  Stressors: dialysis, health, being lonely   Coping Ability:  copes well, adjust well  Skill Deficits:  n/a  Supports:  family, animals, job, therapy    Religion: Christian    Leisure/Recreation: hanging out with family, relaxing with dogs     Exercise/Diet:      CCA Employment/Education Employment/Work Situation: Education officer, 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 Had a relatively good childhood with normal ups and downs.     Childhood History:     Child/Adolescent Assessment:     CCA Substance Use Alcohol/Drug Use: none                            ASAM's:  Six Dimensions of Multidimensional Assessment  Dimension 1:  Acute Intoxication and/or Withdrawal Potential:      Dimension 2:  Biomedical Conditions and Complications:      Dimension 3:  Emotional, Behavioral, or Cognitive Conditions and Complications:     Dimension 4:  Readiness to Change:     Dimension 5:  Relapse, Continued use, or Continued Problem Potential:     Dimension 6:  Recovery/Living Environment:     ASAM Severity Score:    ASAM Recommended Level of Treatment:     Substance use Disorder (SUD)  none  Recommendations  for Services/Supports/Treatments:  Continued therapy  DSM5 Diagnoses: Patient Active Problem List   Diagnosis Date Noted   Acute hypoxic respiratory failure (HCC) 03/24/2022   Acute on chronic combined systolic and diastolic CHF (congestive heart failure) (HCC) 03/24/2022   Acute respiratory failure with hypoxia (HCC) 03/24/2022   Calculus of gallbladder without cholecystitis without obstruction 01/15/2022   Pyrosis 01/15/2022   Colon cancer screening 01/15/2022   Belching 01/15/2022   Left upper quadrant abdominal pain 01/15/2022   ESRD on dialysis (HCC) 10/21/2020   Iron deficiency anemia, unspecified 02/17/2018    Gout, unspecified 02/15/2018   Anemia in chronic kidney disease (CKD) 07/31/2015   Essential hypertension 02/27/2014   Menorrhagia 01/27/2013   HYPERLIPIDEMIA-MIXED 04/20/2008   OBESITY-MORBID (>100') 04/20/2008   MITRAL REGURGITATION 04/20/2008   CARDIOMYOPATHY, DILATED 04/20/2008    Patient Centered Plan: Patient is on the following Treatment Plan(s):  moderate depression   Referrals to Alternative Service(s): Referred to Alternative Service(s):   Place:   Date:   Time:    Referred to Alternative Service(s):   Place:   Date:   Time:    Referred to Alternative Service(s):   Place:   Date:   Time:    Referred to Alternative Service(s):   Place:   Date:   Time:      Collaboration of Care: n/a  Patient/Guardian was advised Release of Information must be obtained prior to any record release in order to collaborate their care with an outside provider. Patient/Guardian was advised if they have not already done so to contact the registration department to sign all necessary forms in order for [place] to release information regarding their care.   Consent: Patient/Guardian gives verbal consent for treatment and assignment of benefits for services provided during this visit. Patient/Guardian expressed understanding and agreed to proceed.   Letta Moynahan T [place], [place]

## 2022-11-02 ENCOUNTER — Other Ambulatory Visit: Payer: Self-pay | Admitting: Obstetrics and Gynecology

## 2022-11-02 ENCOUNTER — Other Ambulatory Visit: Payer: Self-pay

## 2022-11-02 ENCOUNTER — Ambulatory Visit: Payer: 59 | Admitting: Obstetrics and Gynecology

## 2022-11-02 DIAGNOSIS — N921 Excessive and frequent menstruation with irregular cycle: Secondary | ICD-10-CM

## 2022-11-02 MED ORDER — MEGESTROL ACETATE 20 MG PO TABS
20.0000 mg | ORAL_TABLET | Freq: Every day | ORAL | 6 refills | Status: AC
Start: 2022-11-02 — End: ?

## 2022-11-04 ENCOUNTER — Other Ambulatory Visit: Payer: 59 | Admitting: Psychology

## 2022-11-09 ENCOUNTER — Ambulatory Visit (INDEPENDENT_AMBULATORY_CARE_PROVIDER_SITE_OTHER): Payer: 59 | Admitting: Psychology

## 2022-11-09 DIAGNOSIS — F321 Major depressive disorder, single episode, moderate: Secondary | ICD-10-CM

## 2022-11-09 NOTE — Progress Notes (Signed)
DAP note Client name: [name] Therapist name: [name] [name] Date: 08/26/2022  DATA: Client reports, "I am doing okay today. It is helping a lot just to be able to talk to someone. Sometimes it can feel lonely, especially when it comes to my dialysis. I am making an attempt to try and do better with that. I know that I need to go 3 times a week but it is hard and sometimes I just do not feel like it. I am so thankful for my pups. [name] is always coming in the room and it is like he is saying, are you going to get up and go mom? It is so funny, it is like clockwork." The client also touched on spending time with her family and her work.   ASSESSMENT: The client appeared to be in a good mood. She stated, "that therapy was helping. It is nice to have someone to talk with." The client is going through a lot. Her health is not in the best place and dialysis is very draining and can take a lot out of her. We did discuss the importance of her going to dialysis as often as she can. She did report that she is to a place that when she does not go she can really tell and so that does tend to make her want to go. The client appears to be realistic and knowledgeable about her health knowing what she needs to do. She is close with her family and she works very hard. She reports, "I do not want to stop because then I will stop."  PLAN: Client will attend therapy each week, with a goal of 3 sessions per month. Client will attend dialysis 3 times per week with a goal of 3 times per week to maintain her best health. This is very important for her mental health as well. Client reports that when she feels depressed or lonely she feels like this is a level 8 and would like to bring this down to a level 3. We will begin mindfulness and box breathing next session to try and assist with this.

## 2022-11-10 NOTE — Progress Notes (Signed)
DAP note Client name: [name] Therapist name: Letta Moynahan [name] Date: 08/31/2022  DATA: Client reports, "I am doing okay today. It is helping a lot just to be able to talk to someone. Sometimes it can feel lonely, especially when it comes to my dialysis. I am going to go everyday this week. I can tell it helps me so much physically and mentally." Client also started discussing important relationships in her life. She talked a bit about how close her and her mom were and how much she misses her. She began to really start opening up about what she had been through and what she continues to go through.   ASSESSMENT: The client appeared to be in a good mood this week. Each week she comes and continues to be more and more upbeat, opening up more and more.  She stated again, "that therapy was helping. It is nice to have someone to talk with." She is starting to go more in depth. She is painting a more detailed picture about her current life and is starting to talk about the past in smaller doses. She appears to feel comfortable sharing. As we are still getting to know one another, she appears to continue to progress and really seems to appreciate this time each week.   PLAN: Client will attend therapy each week, with a goal of 3 sessions per month. Client will attend dialysis 3 times per week with a goal of 3 times per week to maintain her best health. This is very important for her mental health as well. She reports, "I notice that I have a hard time accomplishing things when I feel bad overall." Client reports that when she feels depressed or lonely she feels like this is a level 8 and would like to bring this down to a level 3. This week she reports she is at a 6 and would like to continue to bring this down to a 3. We began mindfulness and box breathing to try and assist with this. We discussed the importance of taking the time to check in daily with herself and take the moment to  evaluate her needs and how to execute them.

## 2022-11-11 NOTE — Progress Notes (Signed)
DAP note Client name: [name] Therapist name: Letta Moynahan [name] Date: 09/14/2022  DATA: Client reports, "I am doing okay today. I had a good weekend. I went  to visit some family and I had such a good time. I love seeing the pups. I call them my grand dogs.I always take them treats and I took the girl a pearl necklace. She loved it, she was running around like you better not take my necklace off. It was so cute. I also got to see me niece and I love her. I took her some art projects and she was in heaven. It was good to see family and I cooked a lot of food. I also went to dialysis three times last week and I feel a lot better so that motivates me to want to keep going."   ASSESSMENT: The client appeared to be in a good mood this week. She stated again, "that therapy was helping. It is nice to have someone to talk with." She had a great weekend with family. They are a huge support system for her. Physically she has been feeling a lot better so that is also helping. Attending dialysis 3 times a week is one of her goals and she did it and she feels better. She is excited to keep it up.     PLAN: Client will attend therapy each week, with a goal of 3 sessions per month. Client will attend dialysis 3 times per week with a goal of 3 times per week to maintain her best health. This is very important for her mental health as well. She reports, "I notice that I have a hard time accomplishing things when I feel bad overall." Client reports that when she feels depressed or lonely she feels like this is a level 8 and would like to bring this down to a level 3. This week she reports she is at a 5 and would like to continue to bring this down to a 3. We began mindfulness and box breathing to try and assist with this. We discussed the importance of taking the time to check in daily with herself and take the moment to evaluate her needs and how to execute them.

## 2022-11-14 ENCOUNTER — Telehealth: Payer: Self-pay | Admitting: Psychology

## 2022-11-16 ENCOUNTER — Ambulatory Visit (HOSPITAL_COMMUNITY)
Admission: EM | Admit: 2022-11-16 | Discharge: 2022-11-16 | Disposition: A | Discharge status: Home / Self Care | Payer: 59 | Attending: Family Medicine | Admitting: Family Medicine

## 2022-11-16 ENCOUNTER — Encounter (HOSPITAL_COMMUNITY): Payer: Self-pay | Admitting: Emergency Medicine

## 2022-11-16 DIAGNOSIS — J0140 Acute pansinusitis, unspecified: Secondary | ICD-10-CM

## 2022-11-16 DIAGNOSIS — R051 Acute cough: Secondary | ICD-10-CM | POA: Diagnosis not present

## 2022-11-16 MED ORDER — AMOXICILLIN-POT CLAVULANATE 875-125 MG PO TABS: 1.0000 | ORAL_TABLET | Freq: Two times a day (BID) | ORAL | 0 refills | Status: DC

## 2022-11-16 MED ORDER — BENZONATATE 100 MG PO CAPS: 200.0000 mg | ORAL_CAPSULE | Freq: Three times a day (TID) | ORAL | 0 refills | Status: DC | PRN

## 2022-11-16 MED ORDER — PROAIR HFA 108 (90 BASE) MCG/ACT IN AERS: 1.0000 | INHALATION_SPRAY | Freq: Four times a day (QID) | RESPIRATORY_TRACT | 0 refills | Status: DC | PRN

## 2022-11-16 NOTE — ED Triage Notes (Signed)
Pt c/o cough, congestion that is worse when she lays down. Symptoms started 2 weeks. She has tried musinex .  Pt is CHF and Dialysis ([facility]) paitent

## 2022-11-16 NOTE — ED Provider Notes (Signed)
[facility]    CSN: [id number] Arrival date & time: 11/16/22  1100      History   Chief Complaint Chief Complaint  Patient presents with   Cough    HPI [name] is a 47 y.o. female.   Patient with a history of chronic kidney disease and CHF presents today with a 2-week history of worsening cough, cast and nasal congestion.  Patient reports she has been taking Mucinex over-the-counter which has not alleviated her symptoms.  She endorses shortness of breath occurs mostly at bedtime and cough appears to worsen when she tries to lay down to sleep.  She denies any new swelling or any new unintentional weight gain. Patient is compliant with dialysis and completed a treatment on today.  She reported that Mucinex helped temporarily with cough however is concerned as symptoms have remained present for 2 weeks now.  She denies fevers or chills. Past Medical History:  Diagnosis Date   Anemia    Anxiety    pt not aware of this   Arthritis    Breast mass 09/01/2016   Targeted ultrasound is performed, showing a mass in the left breast at 11 o'clock, 1 cm from the nipple measuring 6.4 by 5.0 cm. The mass is heterogeneous and may contain macroscopic fat.   IMPRESSION: The mass in the left breast is probably benign, possibly a Hamartoma.  RECOMMENDATION: Recommend six-month follow-up mammogram of the probably benign left breast mass. If necessary, an ultrasound co   CHF (congestive heart failure) (HCC)    hosp. 2011   Chronic kidney disease    Mon Wed Friday   Dilated cardiomyopathy (HCC)    Encounter for blood transfusion    ESRD (end stage renal disease) (HCC)    MWF - [place]   Family history of adverse reaction to anesthesia     " Aunt has a tracheostomy and kept coughing after colonoscopy "   Foot fracture    Gout    no flare up for several yrs   Headache    Heart murmur    as a child, no problems as an adult   Hemodialysis patient (HCC)    History of  blood transfusion    x 2   History of pneumonia    Hyperlipidemia    Hypertension    Menorrhagia    Morbid obesity (HCC)    Pneumonia    hosp. 2010   Shortness of breath    HX CHL - occasional. 04/09/21 patient sits down and shortness of breath susides.   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   Thyroid disease     Patient Active Problem List   Diagnosis Date Noted   Acute hypoxic respiratory failure (HCC) 03/24/2022   Acute on chronic combined systolic and diastolic CHF (congestive heart failure) (HCC) 03/24/2022   Acute respiratory failure with hypoxia (HCC) 03/24/2022   Calculus of gallbladder without cholecystitis without obstruction 01/15/2022   Pyrosis 01/15/2022   Colon cancer screening 01/15/2022   Belching 01/15/2022   Left upper quadrant abdominal pain 01/15/2022   Complication of vascular dialysis catheter 10/21/2020   ESRD on dialysis (HCC) 10/21/2020   Iron deficiency anemia, unspecified 02/17/2018   Gout, unspecified 02/15/2018   Unspecified lump in the left breast, unspecified quadrant 02/15/2018   Anemia in chronic kidney disease (CKD) 07/31/2015  Essential hypertension 02/27/2014   Menorrhagia 01/27/2013   HYPERLIPIDEMIA-MIXED 04/20/2008   OBESITY-MORBID (>100') 04/20/2008   MITRAL REGURGITATION 04/20/2008   Dilated cardiomyopathy (HCC) 04/20/2008    Past Surgical History:  Procedure Laterality Date   A/V FISTULAGRAM Left 08/13/2021   Procedure: A/V Fistulagram;  Surgeon: Cephus Shelling, MD;  Location: [facility] INVASIVE CV LAB;  Service: Cardiovascular;  Laterality: Left;   ANGIOPLASTY  07/28/2015   Procedure: ANGIOPLASTY OF RIGHT BRACHIAL VEIN;  Surgeon: Fransisco Hertz, MD;  Location: [facility] OR;  Service: Vascular;;   AV FISTULA PLACEMENT  08/04/2011   Procedure: ARTERIOVENOUS (AV) FISTULA CREATION;  Surgeon: Sherren Kerns,  MD;  Location: [facility] OR;  Service: Vascular;  Laterality: Right;  Creation right brachiocephalic arteriovenous fistula   AV FISTULA PLACEMENT Left 02/05/2013   Procedure: ARTERIOVENOUS FISTULA CREATION LEFT ARM;  Surgeon: Fransisco Hertz, MD;  Location: [facility] OR;  Service: Vascular;  Laterality: Left;   AV FISTULA PLACEMENT Right 06/04/2013   Procedure: ARTERIOVENOUS (AV) FISTULA CREATION;  Surgeon: Fransisco Hertz, MD;  Location: [facility] OR;  Service: Vascular;  Laterality: Right;   AV FISTULA PLACEMENT Right 04/15/2014   Procedure: INSERTION OF ARTERIOVENOUS (AV) GORE-TEX GRAFT ARM;  Surgeon: Fransisco Hertz, MD;  [facility] OR;  Service: Vascular;  Laterality: Right;   AV FISTULA PLACEMENT Left 02/25/2021   Procedure: LEFT ARM ARTERIOVENOUS (AV) FISTULA CREATION;  Surgeon: Cephus Shelling, MD;  Location: [facility] OR;  Service: Vascular;  Laterality: Left;   AV FISTULA PLACEMENT Left 08/26/2021   Procedure: LEFT ARM LIGATION OF BRACHIOBASILIC FISTULA,  REPAIR PSEUDOANEURYSM;  Surgeon: Cephus Shelling, MD;  Location: [facility] OR;  Service: Vascular;  Laterality: Left;   AV FISTULA PLACEMENT Left 10/12/2021   Procedure: LEFT RADIOCEPHALIC ARTERIOVENOUS FISTULA  ARTERIOVENOUS (AV) FISTULA CREATION WITH INTERPOSITION GRAFT;  Surgeon: Cephus Shelling, MD;  Location: [facility] OR;  Service: Vascular;  Laterality: Left;   BASCILIC VEIN TRANSPOSITION Right 08/13/2013   Procedure: Second Stage Brachial Vein Transposition;  Surgeon: Fransisco Hertz, MD;  Location: [facility] OR;  Service: Vascular;  Laterality: Right;   BASCILIC VEIN TRANSPOSITION Left 04/13/2021   Procedure: LEFT ARM SECOND STAGE BASILIC VEIN TRANSPOSITION;  Surgeon: Cephus Shelling, MD;  [facility] OR;  Service: Vascular;  Laterality: Left;   CARDIAC SURGERY  1979   repair of hole in heart   GRAFT APPLICATION Left 10/12/2021   Procedure: ARTEGRAFT COLLAGEN VASCULAR GRAFT APPLICATION;  Surgeon: Cephus Shelling, MD;  Location: [facility] OR;  Service: Vascular;  Laterality:  Left;   INCISION AND DRAINAGE OF WOUND Right 07/28/2015   Procedure: IRRIGATION AND DEBRIDEMENT RIGHT HAND  WOUND;  Surgeon: Knute Neu, MD;  Location: [facility] OR;  Service: Plastics;  Laterality: Right;   LIGATION OF ARTERIOVENOUS  FISTULA Left 03/05/2013   Procedure: LIGATION OF ARTERIOVENOUS  FISTULA;  Surgeon: Fransisco Hertz, MD;  Location: [facility] OR;  Service: Vascular;  Laterality: Left;   PATCH ANGIOPLASTY Left 03/05/2013   Procedure: PATCH ANGIOPLASTY;  Surgeon: Fransisco Hertz, MD;  Location: [facility] OR;  Service: Vascular;  Laterality: Left;   PERIPHERAL VASCULAR BALLOON ANGIOPLASTY Left 08/13/2021   Procedure: PERIPHERAL VASCULAR BALLOON ANGIOPLASTY;  Surgeon: Cephus Shelling, MD;  [facility];  Service: Cardiovascular;  Laterality: Left;  arm fistula   PERIPHERAL VASCULAR CATHETERIZATION Right 10/31/2014   Procedure: Fistulagram;  Surgeon: Fransisco Hertz, MD;  Location: [facility] INVASIVE CV LAB;  Service: Cardiovascular;  Laterality: Right;   PERIPHERAL VASCULAR CATHETERIZATION  Right 10/31/2014   Procedure: Peripheral Vascular Balloon Angioplasty;  Surgeon: Fransisco Hertz, MD;  Location: [facility] INVASIVE CV LAB;  Service: Cardiovascular;  Laterality: Right;  pta venous rt arm   PERIPHERAL VASCULAR CATHETERIZATION N/A 03/13/2015   Procedure: A/V Shuntogram;  Surgeon: Fransisco Hertz, MD;  [facility] INVAS[facility] CV LAB;  Service: Cardiovascular;  Laterality: N/A;   PERIPHERAL VASCULAR CATHETERIZATION Right 03/13/2015   Procedure: Peripheral Vascular Balloon Angioplasty;  Surgeon: Fransisco Hertz, MD;  Location: [facility] INVASIVE CV LAB;  Service: Cardiovascular;  Laterality: Right;  Shunt   PERIPHERAL VASCULAR CATHETERIZATION Right 01/19/2016   Procedure: A/V Shuntogram;  Surgeon: Chuck Hint, MD;  Location: [facility] INVASIVE CV LAB;  Service: Cardiovascular;  Laterality: Right;   REVISION OF ARTERIOVENOUS GORETEX GRAFT Right 10/27/2015   Procedure: THROMBECTOMY AND REVISION OF RIGHT UPPER ARM  ARTERIOVENOUS  GORETEX GRAFT USING X 10CM GORE-TEX GRAFT;  Surgeon: Sherren Kerns, MD;  Location: [facility] OR;  Service: Vascular;  Laterality: Right;   REVISION OF ARTERIOVENOUS GORETEX GRAFT Right 01/30/2016   Procedure: REVISION OF RIGHT UPPER ARM ARTERIOVENOUS USING GORETEX GRAFT;  Surgeon: Chuck Hint, MD;  Location: [facility] OR;  Service: Vascular;  Laterality: Right;   THROMBECTOMY W/ EMBOLECTOMY Left 03/05/2013   Procedure: THROMBECTOMY OF LEFT BRACHIAL ARTERY;  Surgeon: Fransisco Hertz, MD;  Location: [facility] OR;  Service: Vascular;  Laterality: Left;   VENOGRAM Bilateral 03/25/2014   Procedure: VENOGRAM bilateral;  Surgeon: Fransisco Hertz, MD;  Location: [facility] CATH LAB;  Service: Cardiovascular;  Laterality: Bilateral;    OB History   No obstetric history on file.      Home Medications    Prior to Admission medications   Medication Sig Start Date End Date Taking? Authorizing Provider  amoxicillin-clavulanate (AUGMENTIN) 875-125 MG tablet Take 1 tablet by mouth every 12 (twelve) hours for 10 days. 11/16/22 11/26/22 Yes Bing Neighbors, NP  benzonatate (TESSALON) 100 MG capsule Take 2 capsules (200 mg total) by mouth 3 (three) times daily as needed for cough. 11/16/22  Yes Bing Neighbors, NP  Aspirin-Caffeine (BAYER BACK & BODY) 500-32.5 MG TABS Take 2 tablets by mouth 2 (two) times daily as needed (pain.).    [provider]  AURYXIA 1 GM 210 MG(Fe) tablet Take 630 mg by mouth See admin instructions. Take 3 tablets (630 mg) by mouth with meals and snacks 01/19/21   [provider]  cinacalcet (SENSIPAR) 60 MG tablet Take 60 mg by mouth in the morning.    [provider]  megestrol (MEGACE) 20 MG tablet Take 1 tablet (20 mg total) by mouth daily. 11/02/22   Lorriane Shire, MD  Methoxy PEG-Epoetin Beta (MIRCERA IJ) Mircera 02/17/22 09/19/23  [provider]  Na Sulfate-K Sulfate-Mg Sulf (SUPREP BOWEL PREP KIT) 17.5-3.13-1.6 GM/177ML SOLN Take 1 kit by mouth as  directed. For colonoscopy prep Patient not taking: Reported on 02/09/2022 01/12/22   Mansouraty, Netty Starring., MD  ondansetron (ZOFRAN) 4 MG tablet Take 1 tablet (4 mg total) by mouth every 12 (twelve) hours. Patient not taking: Reported on 02/09/2022 01/12/22   Mansouraty, Netty Starring., MD  pantoprazole (PROTONIX) 40 MG tablet Take 1 tablet (40 mg total) by mouth daily. 01/12/22   Mansouraty, Netty Starring., MD  PROAIR HFA 108 [phone] Base) MCG/ACT inhaler Inhale 1-2 puffs into the lungs every 6 (six) hours as needed for wheezing or shortness of breath. For wheezing. 11/16/22   Bing Neighbors, NP    Family History  Family History  Problem Relation Age of Onset   Diabetes Mother    Hyperlipidemia Mother    Hypertension Mother    Kidney disease Mother    Anesthesia problems Neg Hx    Hypotension Neg Hx    Malignant hyperthermia Neg Hx    Pseudochol deficiency Neg Hx    Colon cancer Neg Hx    Esophageal cancer Neg Hx    Inflammatory bowel disease Neg Hx    Liver disease Neg Hx    Pancreatic cancer Neg Hx    Rectal cancer Neg Hx    Stomach cancer Neg Hx     Social History Social History   Tobacco Use   Smoking status: Never    Passive exposure: Never   Smokeless tobacco: Never  Vaping Use   Vaping status: Never Used  Substance Use Topics   Alcohol use: No    Alcohol/week: 0.0 standard drinks of alcohol   Drug use: No     Allergies   Pollen extract and Oxycodone-acetaminophen   Review of Systems Review of Systems  Respiratory:  Positive for cough.      Physical Exam Triage Vital Signs ED Triage Vitals  Encounter Vitals Group     BP 11/16/22 1143 (!) 151/97     Systolic BP Percentile --      Diastolic BP Percentile --      Pulse Rate 11/16/22 1143 93     Resp 11/16/22 1143 18     Temp 11/16/22 1143 98.2 F (36.8 C)     Temp Source 11/16/22 1143 Oral     SpO2 11/16/22 1143 94 %     Weight --      Height --      Head Circumference --      Peak Flow --      Pain Score  11/16/22 1141 7     Pain Loc --      Pain Education --      Exclude from Growth Chart --    No data found.  Updated Vital Signs BP (!) 151/97 (BP Location: Right Wrist)   Pulse 93   Temp 98.2 F (36.8 C) (Oral)   Resp 18   SpO2 94%   Visual Acuity Right Eye Distance:   Left Eye Distance:   Bilateral Distance:    Right Eye Near:   Left Eye Near:    Bilateral Near:     Physical Exam Constitutional:      Appearance: She is obese. She is ill-appearing.  HENT:     Head: Normocephalic and atraumatic.     Nose: Congestion and rhinorrhea present.     Mouth/Throat:     Pharynx: No oropharyngeal exudate or posterior oropharyngeal erythema.  Eyes:     Extraocular Movements: Extraocular movements intact.     Conjunctiva/sclera: Conjunctivae normal.     Pupils: Pupils are equal, round, and reactive to light.  Cardiovascular:     Rate and Rhythm: Normal rate and regular rhythm.  Pulmonary:     Effort: Pulmonary effort is normal.  Musculoskeletal:     Cervical back: Normal range of motion.     Right lower leg: Edema present.     Left lower leg: Edema present.     Comments: Per patient lower extremity swelling his baseline  Lymphadenopathy:     Cervical: No cervical adenopathy.  Skin:    General: Skin is warm and dry.  Neurological:     General: No focal deficit present.  Mental Status: She is oriented to person, place, and time.      UC Treatments / Results  Labs (all labs ordered are listed, but only abnormal results are displayed) Labs Reviewed - No data to display  EKG   Radiology No results found.  Procedures Procedures (including critical care time)  Medications Ordered in UC Medications - No data to display  Initial Impression / Assessment and Plan / UC Course  I have reviewed the triage vital signs and the nursing notes.  Pertinent labs & imaging results that were available during my care of the patient were reviewed by me and considered in my  medical decision making (see chart for details).    Acute nonrecurrent pansinusitis empiric treatment with Augmentin twice daily for 10 days.  For acute cough treating with Tessalon 200 mg 3 times daily as needed.  Refilled ProAir patient should use 1 to 2 puffs every 6 hours as needed for any shortness of breath or wheezing.  ED precautions given if any symptoms worsen despite treatment.  Patient verbalized understanding and agreement with plan. Final Clinical Impressions(s) / UC Diagnoses   Final diagnoses:  Acute non-recurrent pansinusitis  Acute cough     Discharge Instructions      Symptoms worsen or do not improve return for evaluation.    ED Prescriptions     Medication Sig Dispense Auth. Provider   PROAIR HFA 108 (90 Base) MCG/ACT inhaler Inhale 1-2 puffs into the lungs every 6 (six) hours as needed for wheezing or shortness of breath. For wheezing. 8 g Bing Neighbors, NP   amoxicillin-clavulanate (AUGMENTIN) 875-125 MG tablet Take 1 tablet by mouth every 12 (twelve) hours for 10 days. 20 tablet Bing Neighbors, NP   benzonatate (TESSALON) 100 MG capsule Take 2 capsules (200 mg total) by mouth 3 (three) times daily as needed for cough. 40 capsule Bing Neighbors, NP      PDMP not reviewed this encounter.   Bing Neighbors, NP 11/16/22 [phone]

## 2022-11-16 NOTE — Discharge Instructions (Addendum)
Symptoms worsen or do not improve return for evaluation.

## 2022-11-18 ENCOUNTER — Other Ambulatory Visit: Payer: 59 | Admitting: Psychology

## 2022-11-21 ENCOUNTER — Other Ambulatory Visit: Payer: Self-pay

## 2022-11-21 ENCOUNTER — Encounter (HOSPITAL_COMMUNITY): Payer: Self-pay | Admitting: Emergency Medicine

## 2022-11-21 ENCOUNTER — Inpatient Hospital Stay (HOSPITAL_COMMUNITY)
Admission: EM | Admit: 2022-11-21 | Discharge: 2022-11-23 | DRG: 291 | Disposition: A | Discharge status: Home / Self Care | Payer: 59 | Attending: Internal Medicine | Admitting: Internal Medicine

## 2022-11-21 ENCOUNTER — Emergency Department (HOSPITAL_COMMUNITY): Payer: 59

## 2022-11-21 DIAGNOSIS — I420 Dilated cardiomyopathy: Secondary | ICD-10-CM | POA: Diagnosis present

## 2022-11-21 DIAGNOSIS — Z8249 Family history of ischemic heart disease and other diseases of the circulatory system: Secondary | ICD-10-CM

## 2022-11-21 DIAGNOSIS — I080 Rheumatic disorders of both mitral and aortic valves: Secondary | ICD-10-CM | POA: Diagnosis present

## 2022-11-21 DIAGNOSIS — I340 Nonrheumatic mitral (valve) insufficiency: Secondary | ICD-10-CM | POA: Diagnosis present

## 2022-11-21 DIAGNOSIS — N250 Renal osteodystrophy: Secondary | ICD-10-CM | POA: Diagnosis present

## 2022-11-21 DIAGNOSIS — Z83438 Family history of other disorder of lipoprotein metabolism and other lipidemia: Secondary | ICD-10-CM

## 2022-11-21 DIAGNOSIS — E079 Disorder of thyroid, unspecified: Secondary | ICD-10-CM | POA: Diagnosis present

## 2022-11-21 DIAGNOSIS — J810 Acute pulmonary edema: Principal | ICD-10-CM

## 2022-11-21 DIAGNOSIS — Z992 Dependence on renal dialysis: Secondary | ICD-10-CM

## 2022-11-21 DIAGNOSIS — Z79890 Hormone replacement therapy (postmenopausal): Secondary | ICD-10-CM

## 2022-11-21 DIAGNOSIS — I5042 Chronic combined systolic (congestive) and diastolic (congestive) heart failure: Secondary | ICD-10-CM | POA: Diagnosis present

## 2022-11-21 DIAGNOSIS — I5023 Acute on chronic systolic (congestive) heart failure: Secondary | ICD-10-CM | POA: Diagnosis not present

## 2022-11-21 DIAGNOSIS — Z79899 Other long term (current) drug therapy: Secondary | ICD-10-CM

## 2022-11-21 DIAGNOSIS — I10 Essential (primary) hypertension: Secondary | ICD-10-CM | POA: Diagnosis present

## 2022-11-21 DIAGNOSIS — Z1152 Encounter for screening for COVID-19: Secondary | ICD-10-CM

## 2022-11-21 DIAGNOSIS — N186 End stage renal disease: Secondary | ICD-10-CM

## 2022-11-21 DIAGNOSIS — E782 Mixed hyperlipidemia: Secondary | ICD-10-CM | POA: Diagnosis present

## 2022-11-21 DIAGNOSIS — Z86718 Personal history of other venous thrombosis and embolism: Secondary | ICD-10-CM

## 2022-11-21 DIAGNOSIS — Z9109 Other allergy status, other than to drugs and biological substances: Secondary | ICD-10-CM

## 2022-11-21 DIAGNOSIS — Z6841 Body Mass Index (BMI) 40.0 and over, adult: Secondary | ICD-10-CM

## 2022-11-21 DIAGNOSIS — Z8701 Personal history of pneumonia (recurrent): Secondary | ICD-10-CM

## 2022-11-21 DIAGNOSIS — F419 Anxiety disorder, unspecified: Secondary | ICD-10-CM | POA: Diagnosis present

## 2022-11-21 DIAGNOSIS — I132 Hypertensive heart and chronic kidney disease with heart failure and with stage 5 chronic kidney disease, or end stage renal disease: Principal | ICD-10-CM | POA: Diagnosis present

## 2022-11-21 DIAGNOSIS — Z833 Family history of diabetes mellitus: Secondary | ICD-10-CM

## 2022-11-21 DIAGNOSIS — I5043 Acute on chronic combined systolic (congestive) and diastolic (congestive) heart failure: Secondary | ICD-10-CM | POA: Diagnosis present

## 2022-11-21 DIAGNOSIS — Z841 Family history of disorders of kidney and ureter: Secondary | ICD-10-CM

## 2022-11-21 DIAGNOSIS — Z885 Allergy status to narcotic agent status: Secondary | ICD-10-CM

## 2022-11-21 DIAGNOSIS — Z602 Problems related to living alone: Secondary | ICD-10-CM | POA: Diagnosis present

## 2022-11-21 DIAGNOSIS — D631 Anemia in chronic kidney disease: Secondary | ICD-10-CM | POA: Diagnosis present

## 2022-11-21 DIAGNOSIS — E66813 Obesity, class 3: Secondary | ICD-10-CM | POA: Diagnosis present

## 2022-11-21 DIAGNOSIS — J9601 Acute respiratory failure with hypoxia: Secondary | ICD-10-CM | POA: Diagnosis present

## 2022-11-21 HISTORY — DX: Dependence on renal dialysis: N18.6

## 2022-11-21 HISTORY — DX: Dependence on renal dialysis: Z99.2

## 2022-11-21 NOTE — Progress Notes (Signed)
DAP note Client name: [name] Therapist name: Letta Moynahan [name] Date: 09/29/2022 Time: 60 mins  DATA: Client reports, "I am doing okay today. I had to cancel two weeks ago because I was not feeling well. However, last week was a pretty good week. I made it to dialysis everyday and so I am feeling a lot better." The client discussed that work is going good and that she has been able to pay her car payment early every month. She reported she is spending a lot of time with family and that she feels pretty happy and less lonely. This week she reports she is at a level 4 when it comes to depression and would like to continue to bring this down to a 3.   ASSESSMENT: The client appeared to be in a good mood this week. She stated again, "that therapy was really helping. It is nice to have someone to talk with." She has really been enjoying her time with family. They are a huge support system for her. Physically she has been feeling a lot better so that is also helping. Attending dialysis 3 times a week is one of her goals and it shows how much it helps her to feel better. She is excited to keep it up.     PLAN: Client will attend therapy each week, with a goal of 3 sessions per month. Client will attend dialysis 3 times per week with a goal of 3 times per week to maintain her best overall health. Client reports that when she feels depressed or lonely she feels like this is a level 8 and would like to bring this down to a level 3. We began mindfulness and box breathing to try and assist with this. We discussed the importance of taking the time to check in daily with herself and take the moment to evaluate her needs and how to execute them.

## 2022-11-21 NOTE — ED Triage Notes (Signed)
Pt presents for cough x 2 weeks. Has been seen by PCP and prescribed tessalon and amoxicillin. Pt returns to ER to follow up continued productive cough and not being able to pick up inhaler also ordered by PCP.   She is a pt who received HD MWF, no missed treatments.

## 2022-11-22 ENCOUNTER — Encounter (HOSPITAL_COMMUNITY): Payer: Self-pay | Admitting: Internal Medicine

## 2022-11-22 ENCOUNTER — Other Ambulatory Visit (HOSPITAL_COMMUNITY): Payer: 59

## 2022-11-22 DIAGNOSIS — I5043 Acute on chronic combined systolic (congestive) and diastolic (congestive) heart failure: Secondary | ICD-10-CM | POA: Diagnosis present

## 2022-11-22 DIAGNOSIS — Z841 Family history of disorders of kidney and ureter: Secondary | ICD-10-CM | POA: Diagnosis not present

## 2022-11-22 DIAGNOSIS — Z83438 Family history of other disorder of lipoprotein metabolism and other lipidemia: Secondary | ICD-10-CM | POA: Diagnosis not present

## 2022-11-22 DIAGNOSIS — J9601 Acute respiratory failure with hypoxia: Secondary | ICD-10-CM | POA: Diagnosis present

## 2022-11-22 DIAGNOSIS — Z8701 Personal history of pneumonia (recurrent): Secondary | ICD-10-CM | POA: Diagnosis not present

## 2022-11-22 DIAGNOSIS — E66813 Obesity, class 3: Secondary | ICD-10-CM | POA: Diagnosis present

## 2022-11-22 DIAGNOSIS — N250 Renal osteodystrophy: Secondary | ICD-10-CM | POA: Diagnosis present

## 2022-11-22 DIAGNOSIS — F419 Anxiety disorder, unspecified: Secondary | ICD-10-CM | POA: Diagnosis present

## 2022-11-22 DIAGNOSIS — I420 Dilated cardiomyopathy: Secondary | ICD-10-CM | POA: Diagnosis present

## 2022-11-22 DIAGNOSIS — Z833 Family history of diabetes mellitus: Secondary | ICD-10-CM | POA: Diagnosis not present

## 2022-11-22 DIAGNOSIS — Z1152 Encounter for screening for COVID-19: Secondary | ICD-10-CM | POA: Diagnosis not present

## 2022-11-22 DIAGNOSIS — Z79899 Other long term (current) drug therapy: Secondary | ICD-10-CM | POA: Diagnosis not present

## 2022-11-22 DIAGNOSIS — Z8249 Family history of ischemic heart disease and other diseases of the circulatory system: Secondary | ICD-10-CM | POA: Diagnosis not present

## 2022-11-22 DIAGNOSIS — I5023 Acute on chronic systolic (congestive) heart failure: Secondary | ICD-10-CM | POA: Insufficient documentation

## 2022-11-22 DIAGNOSIS — Z602 Problems related to living alone: Secondary | ICD-10-CM | POA: Diagnosis present

## 2022-11-22 DIAGNOSIS — Z6841 Body Mass Index (BMI) 40.0 and over, adult: Secondary | ICD-10-CM | POA: Diagnosis not present

## 2022-11-22 DIAGNOSIS — E079 Disorder of thyroid, unspecified: Secondary | ICD-10-CM | POA: Diagnosis present

## 2022-11-22 DIAGNOSIS — E782 Mixed hyperlipidemia: Secondary | ICD-10-CM | POA: Diagnosis present

## 2022-11-22 DIAGNOSIS — I132 Hypertensive heart and chronic kidney disease with heart failure and with stage 5 chronic kidney disease, or end stage renal disease: Secondary | ICD-10-CM | POA: Diagnosis present

## 2022-11-22 DIAGNOSIS — D631 Anemia in chronic kidney disease: Secondary | ICD-10-CM | POA: Diagnosis present

## 2022-11-22 DIAGNOSIS — Z992 Dependence on renal dialysis: Secondary | ICD-10-CM | POA: Diagnosis not present

## 2022-11-22 DIAGNOSIS — N186 End stage renal disease: Secondary | ICD-10-CM | POA: Diagnosis present

## 2022-11-22 DIAGNOSIS — I340 Nonrheumatic mitral (valve) insufficiency: Secondary | ICD-10-CM | POA: Diagnosis present

## 2022-11-22 DIAGNOSIS — Z79890 Hormone replacement therapy (postmenopausal): Secondary | ICD-10-CM | POA: Diagnosis not present

## 2022-11-22 LAB — BASIC METABOLIC PANEL
Anion gap: 16 — ABNORMAL HIGH (ref 5–15)
BUN: 68 mg/dL — ABNORMAL HIGH (ref 6–20)
CO2: 21 mmol/L — ABNORMAL LOW (ref 22–32)
Calcium: 9.4 mg/dL (ref 8.9–10.3)
Chloride: 103 mmol/L (ref 98–111)
Creatinine, Ser: 12.74 mg/dL — ABNORMAL HIGH (ref 0.44–1.00)
GFR, Estimated: 3 mL/min — ABNORMAL LOW (ref >60)
Glucose, Bld: 98 mg/dL (ref 70–99)
Potassium: 3.9 mmol/L (ref 3.5–5.1)
Sodium: 140 mmol/L (ref 135–145)

## 2022-11-22 LAB — CBC WITH DIFFERENTIAL/PLATELET
Abs Immature Granulocytes: 0.05 10*3/uL (ref 0.00–0.07)
Basophils Absolute: 0.1 10*3/uL (ref 0.0–0.1)
Basophils Relative: 0 %
Eosinophils Absolute: 0.4 10*3/uL (ref 0.0–0.5)
Eosinophils Relative: 3 %
HCT: 30.4 % — ABNORMAL LOW (ref 36.0–46.0)
Hemoglobin: 10 g/dL — ABNORMAL LOW (ref 12.0–15.0)
Immature Granulocytes: 0 %
Lymphocytes Relative: 11 %
Lymphs Abs: 1.3 10*3/uL (ref 0.7–4.0)
MCH: 33.3 pg (ref 26.0–34.0)
MCHC: 32.9 g/dL (ref 30.0–36.0)
MCV: 101.3 fL — ABNORMAL HIGH (ref 80.0–100.0)
Monocytes Absolute: 0.6 10*3/uL (ref 0.1–1.0)
Monocytes Relative: 5 %
Neutro Abs: 9.6 10*3/uL — ABNORMAL HIGH (ref 1.7–7.7)
Neutrophils Relative %: 81 %
Platelets: 200 10*3/uL (ref 150–400)
RBC: 3 MIL/uL — ABNORMAL LOW (ref 3.87–5.11)
RDW: 16.1 % — ABNORMAL HIGH (ref 11.5–15.5)
WBC: 12 10*3/uL — ABNORMAL HIGH (ref 4.0–10.5)
nRBC: 0 % (ref 0.0–0.2)

## 2022-11-22 LAB — RESP PANEL BY RT-PCR (RSV, FLU A&B, COVID)  RVPGX2
Influenza A by PCR: NEGATIVE
Influenza B by PCR: NEGATIVE
Resp Syncytial Virus by PCR: NEGATIVE
SARS Coronavirus 2 by RT PCR: NEGATIVE

## 2022-11-22 LAB — BRAIN NATRIURETIC PEPTIDE: B Natriuretic Peptide: 2900.8 pg/mL — ABNORMAL HIGH (ref 0.0–100.0)

## 2022-11-22 LAB — HEPATITIS B SURFACE ANTIGEN: Hepatitis B Surface Ag: NONREACTIVE

## 2022-11-22 LAB — MAGNESIUM: Magnesium: 2.3 mg/dL (ref 1.7–2.4)

## 2022-11-22 LAB — PHOSPHORUS: Phosphorus: 7.8 mg/dL — ABNORMAL HIGH (ref 2.5–4.6)

## 2022-11-22 MED ORDER — CHLORHEXIDINE GLUCONATE CLOTH 2 % EX PADS
6.0000 | MEDICATED_PAD | Freq: Every day | CUTANEOUS | Status: DC
  Administered 2022-11-23: 6 via TOPICAL

## 2022-11-22 MED ORDER — AMOXICILLIN-POT CLAVULANATE 875-125 MG PO TABS: 1.0000 | ORAL_TABLET | Freq: Two times a day (BID) | ORAL | Status: DC

## 2022-11-22 MED ORDER — ALBUTEROL SULFATE (2.5 MG/3ML) 0.083% IN NEBU
2.5000 mg | INHALATION_SOLUTION | Freq: Four times a day (QID) | RESPIRATORY_TRACT | Status: DC | PRN
Start: 2022-11-22 — End: 2022-11-23

## 2022-11-22 MED ORDER — AMOXICILLIN-POT CLAVULANATE 500-125 MG PO TABS
1.0000 | ORAL_TABLET | Freq: Two times a day (BID) | ORAL | Status: DC
  Administered 2022-11-22 – 2022-11-23 (×3): 1 via ORAL
  Filled 2022-11-22 (×4): qty 1

## 2022-11-22 MED ORDER — ONDANSETRON HCL 4 MG/2ML IJ SOLN: 4.0000 mg | Freq: Four times a day (QID) | INTRAMUSCULAR | Status: DC | PRN

## 2022-11-22 MED ORDER — CINACALCET HCL 30 MG PO TABS
90.0000 mg | ORAL_TABLET | Freq: Every day | ORAL | Status: DC
  Administered 2022-11-22 – 2022-11-23 (×2): 90 mg via ORAL
  Filled 2022-11-22 (×3): qty 3

## 2022-11-22 MED ORDER — FERRIC CITRATE 1 GM 210 MG(FE) PO TABS
630.0000 mg | ORAL_TABLET | Freq: Three times a day (TID) | ORAL | Status: DC
  Administered 2022-11-22 – 2022-11-23 (×2): 630 mg via ORAL
  Filled 2022-11-22 (×4): qty 3

## 2022-11-22 MED ORDER — MELATONIN 3 MG PO TABS: 3.0000 mg | ORAL_TABLET | Freq: Every evening | ORAL | Status: DC | PRN

## 2022-11-22 MED ORDER — CINACALCET HCL 30 MG PO TABS: 90.0000 mg | ORAL_TABLET | ORAL | Status: DC

## 2022-11-22 MED ORDER — BENZONATATE 100 MG PO CAPS
200.0000 mg | ORAL_CAPSULE | Freq: Three times a day (TID) | ORAL | Status: DC | PRN
  Administered 2022-11-22: 200 mg via ORAL
  Filled 2022-11-22: qty 2

## 2022-11-22 MED ORDER — ALPRAZOLAM 0.5 MG PO TABS
0.5000 mg | ORAL_TABLET | Freq: Once | ORAL | Status: AC
  Administered 2022-11-22: 0.5 mg via ORAL
  Filled 2022-11-22: qty 1

## 2022-11-22 MED ORDER — MEGESTROL ACETATE 20 MG PO TABS
20.0000 mg | ORAL_TABLET | Freq: Every day | ORAL | Status: DC
  Administered 2022-11-22 – 2022-11-23 (×2): 20 mg via ORAL
  Filled 2022-11-22 (×2): qty 1

## 2022-11-22 MED ORDER — PANTOPRAZOLE SODIUM 40 MG PO TBEC
40.0000 mg | DELAYED_RELEASE_TABLET | Freq: Every day | ORAL | Status: DC
  Administered 2022-11-22 – 2022-11-23 (×2): 40 mg via ORAL
  Filled 2022-11-22 (×2): qty 1

## 2022-11-22 MED ORDER — FUROSEMIDE 10 MG/ML IJ SOLN
40.0000 mg | Freq: Once | INTRAMUSCULAR | Status: AC
  Administered 2022-11-22: 40 mg via INTRAVENOUS
  Filled 2022-11-22: qty 4

## 2022-11-22 NOTE — H&P (Signed)
History and Physical    Patient: [name] WGN:[id number] DOB: 1975/01/14 DOA: 11/21/2022 DOS: the patient was seen and examined on 11/22/2022 PCP: [name], MD  Patient coming from: Home  Chief Complaint: Cough.  HPI: [name] is a 47 y.o. female with medical history significant of anemia, anxiety, osteoarthritis, breast mass, ESRD on dialysis, gout, headache, history of pneumonia, hyperlipidemia, hypertension, menorrhagia, class III obesity, DVT of upper extremities, unspecified thyroid disease, dilated cardiomyopathy, history of combined systolic and diastolic heart failure who was seen in the emergency department 6 days ago for a 2-week history of cough that did not improve with Mucinex.  She was prescribed amoxicillin/clavulanic acid but returned last night with persistent cough and dyspnea.  No URI symptoms, headache, sick contacts or travel history.He denied fever, chills, rhinorrhea, sore throat, wheezing or hemoptysis.  No chest pain, palpitations, diaphoresis, PND, orthopnea or pitting edema of the lower extremities.  No abdominal pain, nausea, emesis, constipation, melena or hematochezia, but has been having diarrhea since she started the Augmentin.  No flank pain, dysuria, frequency or hematuria.  No polyuria, polydipsia, polyphagia or blurred vision.   Lab work: Coronavirus, influenza and RSV PCR was negative.  CBC showed a white count of 12.0, hemoglobin 10.0 g/dL with an MCV of 784.6 fL and platelets 200.  BMP with a CO2 of 21 mmol/L with an anion gap of 16.  BUN was 68 and creatinine 12.74 mg/dL, the rest of the BMP measurements were normal.  BNP was 2901 pg/mL.  Imaging: 2 view chest radiograph shows changes of CHF with cardiomegaly vascular congestion with mild interstitial edema.  There was no focal infiltrate or effusion.  ED course: Initial vital signs were temperature 98.6 F, pulse 97, respiration 27, BP 162/93 mmHg O2 sat 94% on room air.  Patient was  placed on nasal cannula oxygen and received furosemide 40 mg IVP x 1.  Review of Systems: As mentioned in the history of present illness. All other systems reviewed and are negative. Past Medical History:  Diagnosis Date   Anemia    Anxiety    pt not aware of this   Arthritis    Breast mass 09/01/2016   Targeted ultrasound is performed, showing a mass in the left breast at 11 o'clock, 1 cm from the nipple measuring 6.4 by 5.0 cm. The mass is heterogeneous and may contain macroscopic fat.   IMPRESSION: The mass in the left breast is probably benign, possibly a Hamartoma.  RECOMMENDATION: Recommend six-month follow-up mammogram of the probably benign left breast mass. If necessary, an ultrasound co   CHF (congestive heart failure) (HCC)    hosp. 2011   Chronic kidney disease    Mon Wed Friday   Dilated cardiomyopathy (HCC)    Encounter for blood transfusion    ESRD (end stage renal disease) (HCC)    [place]WF - [place]   Family history of adverse reaction to anesthesia     " Aunt has a tracheostomy and kept coughing after colonoscopy "   Foot fracture    Gout    no flare up for several yrs   Headache    Heart murmur    as a child, no problems as an adult   Hemodialysis patient (HCC)    History of blood transfusion    x 2   History of pneumonia    Hyperlipidemia    Hypertension    Menorrhagia    Morbid obesity (HCC)    Pneumonia  hosp. 2010   Shortness of breath    HX CHL - occasional. 04/09/21 patient sits down and shortness of breath susides.   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   Thyroid disease    Past Surgical History:  Procedure Laterality Date   A/V FISTULAGRAM Left 08/13/2021   Procedure: A/V Fistulagram;  Surgeon: Cephus Shelling, MD;  Location: [facility] INVASIVE CV LAB;  Service: Cardiovascular;  Laterality: Left;    ANGIOPLASTY  07/28/2015   Procedure: ANGIOPLASTY OF RIGHT BRACHIAL VEIN;  Surgeon: Fransisco Hertz, MD;  Location: [facility] OR;  Service: Vascular;;   AV FISTULA PLACEMENT  08/04/2011   Procedure: ARTERIOVENOUS (AV) FISTULA CREATION;  Surgeon: Sherren Kerns, MD;  Location: [facility] OR;  Service: Vascular;  Laterality: Right;  Creation right brachiocephalic arteriovenous fistula   AV FISTULA PLACEMENT Left 02/05/2013   Procedure: ARTERIOVENOUS FISTULA CREATION LEFT ARM;  Surgeon: Fransisco Hertz, MD;  Location: [facility] OR;  Service: Vascular;  Laterality: Left;   AV FISTULA PLACEMENT Right 06/04/2013   Procedure: ARTERIOVENOUS (AV) FISTULA CREATION;  Surgeon: Fransisco Hertz, MD;  Location: [facility] OR;  Service: Vascular;  Laterality: Right;   AV FISTULA PLACEMENT Right 04/15/2014   Procedure: INSERTION OF ARTERIOVENOUS (AV) GORE-TEX GRAFT ARM;  Surgeon: Fransisco Hertz, MD;  Location: [facility] OR;  Service: Vascular;  Laterality: Right;   AV FISTULA PLACEMENT Left 02/25/2021   Procedure: LEFT ARM ARTERIOVENOUS (AV) FISTULA CREATION;  Surgeon: Cephus Shelling, MD;  Location: [facility] OR;  Service: Vascular;  Laterality: Left;   AV FISTULA PLACEMENT Left 08/26/2021   Procedure: LEFT ARM LIGATION OF BRACHIOBASILIC FISTULA,  REPAIR PSEUDOANEURYSM;  Surgeon: Cephus Shelling, MD;  Location: [facility] OR;  Service: Vascular;  Laterality: Left;   AV FISTULA PLACEMENT Left 10/12/2021   Procedure: LEFT RADIOCEPHALIC ARTERIOVENOUS FISTULA  ARTERIOVENOUS (AV) FISTULA CREATION WITH INTERPOSITION GRAFT;  Surgeon: Cephus Shelling, MD;  Location: [facility] OR;  Service: Vascular;  Laterality: Left;   BASCILIC VEIN TRANSPOSITION Right 08/13/2013   Procedure: Second Stage Brachial Vein Transposition;  Surgeon: Fransisco Hertz, MD;  Location: [facility] OR;  Service: Vascular;  Laterality: Right;   BASCILIC VEIN TRANSPOSITION Left 04/13/2021   Procedure: LEFT ARM SECOND STAGE BASILIC VEIN TRANSPOSITION;  Surgeon: Cephus Shelling, MD;  Location: [facility] OR;  Service: Vascular;   Laterality: Left;   CARDIAC SURGERY  1979   repair of hole in heart   GRAFT APPLICATION Left 10/12/2021   Procedure: ARTEGRAFT COLLAGEN VASCULAR GRAFT APPLICATION;  Surgeon: Cephus Shelling, MD;  Location: [facility] OR;  Service: Vascular;  Laterality: Left;   INCISION AND DRAINAGE OF WOUND Right 07/28/2015   Procedure: IRRIGATION AND DEBRIDEMENT RIGHT HAND  WOUND;  Surgeon: Knute Neu, MD;  Location: [facility] OR;  Service: Plastics;  Laterality: Right;   LIGATION OF ARTERIOVENOUS  FISTULA Left 03/05/2013   Procedure: LIGATION OF ARTERIOVENOUS  FISTULA;  Surgeon: Fransisco Hertz, MD;  Location: [facility] OR;  Service: Vascular;  Laterality: Left;   PATCH ANGIOPLASTY Left 03/05/2013   Procedure: PATCH ANGIOPLASTY;  Surgeon: Fransisco Hertz, MD;  Location: [facility] OR;  Service: Vascular;  Laterality: Left;   PERIPHERAL VASCULAR BALLOON ANGIOPLASTY Left 08/13/2021   Procedure: PERIPHERAL VASCULAR BALLOON ANGIOPLASTY;  Surgeon: Cephus Shelling, MD;  Location: [facility] INVASIVE CV LAB;  Service: Cardiovascular;  Laterality: Left;  arm fistula  PERIPHERAL VASCULAR CATHETERIZATION Right 10/31/2014   Procedure: Fistulagram;  Surgeon: Fransisco Hertz, MD;  Location: [facility] INVASIVE CV LAB;  Service: Cardiovascular;  Laterality: Right;   PERIPHERAL VASCULAR CATHETERIZATION Right 10/31/2014   Procedure: Peripheral Vascular Balloon Angioplasty;  Surgeon: Fransisco Hertz, MD;  Location: [facility] INVASIVE CV LAB;  Service: Cardiovascular;  Laterality: Right;  pta venous rt arm   PERIPHERAL VASCULAR CATHETERIZATION N/A 03/13/2015   Procedure: A/V Shuntogram;  Surgeon: Fransisco Hertz, MD;  Location: [facility] INVASIVE CV LAB;  Service: Cardiovascular;  Laterality: N/A;   PERIPHERAL VASCULAR CATHETERIZATION Right 03/13/2015   Procedure: Peripheral Vascular Balloon Angioplasty;  Surgeon: Fransisco Hertz, MD;  Location: [facility] INVASIVE CV LAB;  Service: Cardiovascular;  Laterality: Right;  Shunt   PERIPHERAL VASCULAR CATHETERIZATION Right 01/19/2016   Procedure: A/V Shuntogram;   Surgeon: Chuck Hint, MD;  Location: [facility] INVASIVE CV LAB;  Service: Cardiovascular;  Laterality: Right;   REVISION OF ARTERIOVENOUS GORETEX GRAFT Right 10/27/2015   Procedure: THROMBECTOMY AND REVISION OF RIGHT UPPER ARM  ARTERIOVENOUS GORETEX GRAFT USING X 10CM GORE-TEX GRAFT;  Surgeon: Sherren Kerns, MD;  Location: [facility] OR;  Service: Vascular;  Laterality: Right;   REVISION OF ARTERIOVENOUS GORETEX GRAFT Right 01/30/2016   Procedure: REVISION OF RIGHT UPPER ARM ARTERIOVENOUS USING GORETEX GRAFT;  Surgeon: Chuck Hint, MD;  Location: [facility] OR;  Service: Vascular;  Laterality: Right;   THROMBECTOMY W/ EMBOLECTOMY Left 03/05/2013   Procedure: THROMBECTOMY OF LEFT BRACHIAL ARTERY;  Surgeon: Fransisco Hertz, MD;  Location: [facility] OR;  Service: Vascular;  Laterality: Left;   VENOGRAM Bilateral 03/25/2014   Procedure: VENOGRAM bilateral;  Surgeon: Fransisco Hertz, MD;  Location: [facility] CATH LAB;  Service: Cardiovascular;  Laterality: Bilateral;   Social History:  reports that she has never smoked. She has never been exposed to tobacco smoke. She has never used smokeless tobacco. She reports that she does not drink alcohol and does not use drugs.  Allergies  Allergen Reactions   Pollen Extract Itching    Unknown   Oxycodone-Acetaminophen Itching    Tolerates with benadryl     Family History  Problem Relation Age of Onset   Diabetes Mother    Hyperlipidemia Mother    Hypertension Mother    Kidney disease Mother    Anesthesia problems Neg Hx    Hypotension Neg Hx    Malignant hyperthermia Neg Hx    Pseudochol deficiency Neg Hx    Colon cancer Neg Hx    Esophageal cancer Neg Hx    Inflammatory bowel disease Neg Hx    Liver disease Neg Hx    Pancreatic cancer Neg Hx    Rectal cancer Neg Hx    Stomach cancer Neg Hx     Prior to Admission medications   Medication Sig Start Date End Date Taking? Authorizing Provider  amoxicillin-clavulanate (AUGMENTIN) 875-125 MG tablet Take 1 tablet  by mouth every 12 (twelve) hours for 10 days. 11/16/22 11/26/22 Yes Bing Neighbors, NP  Aspirin-Caffeine (BAYER BACK & BODY) 500-32.5 MG TABS Take 2 tablets by mouth 2 (two) times daily as needed (pain.).   Yes [provider]  AURYXIA 1 GM 210 MG(Fe) tablet Take 630 mg by mouth See admin instructions. Take 3 tablets (630 mg) by mouth with meals and snacks 01/19/21  Yes [provider]  benzonatate (TESSALON) 100 MG capsule Take 2 capsules (200 mg total) by mouth 3 (three) times daily as needed for cough. 11/16/22  Yes Harris,  Godfrey Pick, NP  megestrol (MEGACE) 20 MG tablet Take 1 tablet (20 mg total) by mouth daily. 11/02/22  Yes Lorriane Shire, MD  Methoxy PEG-Epoetin Beta (MIRCERA IJ) On dialysis Days 02/17/22 09/19/23 Yes [provider]  pantoprazole (PROTONIX) 40 MG tablet Take 1 tablet (40 mg total) by mouth daily. 01/12/22  Yes Mansouraty, Netty Starring., MD  PROAIR HFA 108 [phone] Base) MCG/ACT inhaler Inhale 1-2 puffs into the lungs every 6 (six) hours as needed for wheezing or shortness of breath. For wheezing. 11/16/22  Yes Bing Neighbors, NP  SENSIPAR 90 MG tablet Take 90 mg by mouth daily. 06/01/22  Yes [provider]  Na Sulfate-K Sulfate-Mg Sulf (SUPREP BOWEL PREP KIT) 17.5-3.13-1.6 GM/177ML SOLN Take 1 kit by mouth as directed. For colonoscopy prep Patient not taking: Reported on 02/09/2022 01/12/22   Mansouraty, Netty Starring., MD  ondansetron (ZOFRAN) 4 MG tablet Take 1 tablet (4 mg total) by mouth every 12 (twelve) hours. Patient not taking: Reported on 02/09/2022 01/12/22   Mansouraty, Netty Starring., MD    Physical Exam: Vitals:   11/22/22 0300 11/22/22 0315 11/22/22 0700 11/22/22 0712  BP: (!) 168/94 (!) 149/105  (!) 145/94  Pulse: 97 95  (!) 58  Resp: (!) 28 (!) 25  13  Temp: 98.8 F (37.1 C)  98 F (36.7 C)   TempSrc:      SpO2: 97% 96%  97%  Weight:       Physical Exam Vitals and nursing note reviewed.  Constitutional:      General: She  is awake. She is not in acute distress.    Appearance: Normal appearance. She is morbidly obese. She is ill-appearing.     Interventions: Nasal cannula in place.  HENT:     Head: Normocephalic.     Nose: No rhinorrhea.     Mouth/Throat:     Mouth: Mucous membranes are moist.  Eyes:     General: No scleral icterus.    Pupils: Pupils are equal, round, and reactive to light.  Neck:     Vascular: No JVD.  Cardiovascular:     Rate and Rhythm: Normal rate and regular rhythm.     Heart sounds: S1 normal and S2 normal.     Arteriovenous access: Left arteriovenous access is present. Pulmonary:     Effort: Pulmonary effort is normal. No tachypnea, accessory muscle usage or respiratory distress.     Breath sounds: Examination of the right-lower field reveals rales. Examination of the left-lower field reveals rales. Rales present. No wheezing or rhonchi.  Abdominal:     General: Abdomen is protuberant. Bowel sounds are normal. There is no distension.     Palpations: Abdomen is soft.     Tenderness: There is no abdominal tenderness.  Musculoskeletal:     Cervical back: Neck supple.     Right lower leg: No edema.     Left lower leg: No edema.  Skin:    General: Skin is warm and dry.  Neurological:     General: No focal deficit present.     Mental Status: She is alert and oriented to person, place, and time.  Psychiatric:        Mood and Affect: Mood normal.        Behavior: Behavior normal. Behavior is cooperative.     Data Reviewed:  Results are pending, will review when available. 04/24/2013 transthoracic echocardiogram Study Conclusions   - Left ventricle: The cavity size was mildly dilated. There  was severe concentric hypertrophy. Systolic function was    mildly reduced. The estimated ejection fraction was in the    range of 45% to 50%. Wall motion was normal; there were no    regional wall motion abnormalities. Features are    consistent with a pseudonormal left  ventricular filling    pattern, with concomitant abnormal relaxation and    increased filling pressure (grade 2 diastolic    dysfunction). Doppler parameters are consistent with    elevated ventricular end-diastolic filling pressure.  - Aortic valve: Trileaflet; normal thickness leaflets. No    regurgitation.  - Aortic root: The aortic root was normal in size.  - Mitral valve: Mitral annular calcification    predominantlyposterior. Mild regurgitation.  - Left atrium: The atrium was moderately dilated.  - Right ventricle: Systolic function was normal.  - Tricuspid valve: No regurgitation.  - Pulmonary arteries: Systolic pressure was within the    normal range.  - Inferior vena cava: The vessel was normal in size.  - Pericardium, extracardiac: There was no pericardial    effusion.   EKG: Vent. rate 97 BPM PR interval 135 ms QRS duration 115 ms QT/QTcB 405/515 ms P-R-T axes 62 9 99 Sinus rhythm Ventricular premature complex Probable left atrial enlargement Left ventricular hypertrophy  Assessment and Plan: Principal Problem:   Acute on chronic combined systolic  and diastolic CHF (congestive heart failure) (HCC) In the setting of:   Dilated cardiomyopathy (HCC) Inpatient/PCU. Supplemental oxygen as needed. Sodium and fluid restriction. Received furosemide 40 mg IVP earlier. Monitor daily weights, intake and output. Monitor renal function/electrolytes. Check transthoracic echocardiogram. Nephrology consulted for fluid management.  Active Problems:   Anemia in chronic kidney disease (CKD) Monitor hematocrit hemoglobin. Erythropoietin per nephrology.    ESRD on dialysis [facility]) Will receive dialysis later today.    Mixed hyperlipidemia Currently not on medical therapy.    MITRAL REGURGITATION Will obtain echocardiogram.    Essential hypertension Monitor blood pressure.    Body mass index (BMI) 45.0-49.9, adult (HCC) Class III obesity range. Current BMI 46.23  kg/m. Lifestyle modifications. Follow-up with closely with PCP and/or bariatric clinic.      Advance Care Planning:   Code Status: Full Code   Consults: Nephrology Delano Metz, MD).  Family Communication:   Severity of Illness: The appropriate patient status for this patient is INPATIENT. Inpatient status is judged to be reasonable and necessary in order to provide the required intensity of service to ensure the patient's safety. The patient's presenting symptoms, physical exam findings, and initial radiographic and laboratory data in the context of their chronic comorbidities is felt to place them at high risk for further clinical deterioration. Furthermore, it is not anticipated that the patient will be medically stable for discharge from the hospital within 2 midnights of admission.   * I certify that at the point of admission it is my clinical judgment that the patient will require inpatient hospital care spanning beyond 2 midnights from the point of admission due to high intensity of service, high risk for further deterioration and high frequency of surveillance required.*  Author: Bobette Mo, MD 11/22/2022 8:33 AM  For on call review [email or web address].   This document was prepared using Dragon voice recognition software and may contain some unintended transcription errors.

## 2022-11-22 NOTE — Progress Notes (Signed)
Carryover admission to the Day Admitter at [facility].  I discussed this case with the EDP, Dr. Nicanor Alcon.  Per these discussions:   This is a 47 year old female with history of chronic systolic/diastolic heart failure, end-stage renal disease on hemodialysis on Monday, Wednesday, Friday, who is being admitted to [facility] for acute on chronic systolic/diastolic heart failure after presenting to [facility] ED with 2 days of progressive shortness of breath.  BNP elevated.  Chest x-ray shows evidence of pulmonary edema.  Presentation not associate with any chest pain.  Oxygen saturations in the mid 90s on room air.  No evidence of acute respiratory distress.  Patient reports that she has been compliant in attending all of her HD sessions.  I have placed an order for inpatient admission to [facility] For further evaluation and management of the above.  I have placed some additional preliminary admit orders via the adult multi-morbid admission order set. I have also ordered add-on magnesium and phosphorus levels.  Will need nephrology consult at [facility].     Newton Pigg, DO Hospitalist

## 2022-11-22 NOTE — Progress Notes (Signed)
[facility] admitting physician addendum:  The patient has become anxious about staying since she is still waiting at [facility] emergency department. I have called patient placement so they can give her priority and she has been moved up in the priority list since she will need dialysis and an echocardiogram today.  She has agreed to wait for another hour. I have told her that it would be best to stay as she might get more symptomatic, needing to return again later and will need to restart the process again.  She voiced understanding.  She would like something to help her calm her anxiety. She weighs 142 kg. Alprazolam 0.5 mg p.o. x 1 dose ordered.  Sanda Klein, MD.

## 2022-11-22 NOTE — Procedures (Signed)
I have reviewed the HD regimen and made appropriate changes.  Vinson Moselle MD  [facility] 11/22/2022, 4:40 PM
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
## 2022-11-22 NOTE — Progress Notes (Signed)
Pt goal met and had bad cramping during tx.  11/22/22 2039  Vitals  Temp 97.8 F (36.6 C)  Temp Source Oral  BP (!) 156/95  BP Location Right Arm  BP Method Automatic  Patient Position (if appropriate) Lying  Pulse Rate 90  Pulse Rate Source Monitor  Resp (!) 24  Oxygen Therapy  SpO2 100 %  O2 Device Room Air  During Treatment Monitoring  Blood Flow Rate (mL/min) 350 mL/min  Arterial Pressure (mmHg) -202.01 mmHg  Venous Pressure (mmHg) 258.17 mmHg  TMP (mmHg) 30.3 mmHg  Ultrafiltration Rate (mL/min) 1316 mL/min  Dialysate Flow Rate (mL/min) 300 ml/min  Duration of HD Treatment -hour(s) 3.47 hour(s)  Cumulative Fluid Removed (mL) per Treatment  3456.9  HD Safety Checks Performed Yes  Intra-Hemodialysis Comments Tx completed  Post Treatment  Dialyzer Clearance Lightly streaked  Hemodialysis Intake (mL) 0 mL  Liters Processed 76  Fluid Removed (mL) 3500 mL  Tolerated HD Treatment Yes  Post-Hemodialysis Comments Pt goal met  AVG/AVF Arterial Site Held (minutes) 10 minutes  AVG/AVF Venous Site Held (minutes) 10 minutes

## 2022-11-22 NOTE — ED Notes (Signed)
Pt stated she has been here since 11 o'clock last night and is ready to leave. Pt requesting to speak to the doctor. RN Notified and made aware of pt wishes.

## 2022-11-22 NOTE — ED Provider Notes (Addendum)
[place] EMERGENCY DEPARTMENT AT [facility] Provider Note   CSN: [id number] Arrival date & time: 11/21/22  2251     History  No chief complaint on file.   [name] is a 47 y.o. female.  The history is provided by the patient.  Shortness of Breath Severity:  Severe Onset quality:  Gradual Duration: weeks. Timing:  Constant Progression:  Worsening Chronicity:  Recurrent Context: not strong odors   Relieved by:  Nothing Worsened by:  Nothing Ineffective treatments:  None tried Associated symptoms: cough   Associated symptoms: no chest pain, no fever and no vomiting   Patient with ESRD MWF with SOB and cough on antibiotics and that has not worked.       Home Medications Prior to Admission medications   Medication Sig Start Date End Date Taking? Authorizing Provider  amoxicillin-clavulanate (AUGMENTIN) 875-125 MG tablet Take 1 tablet by mouth every 12 (twelve) hours for 10 days. 11/16/22 11/26/22 Yes Bing Neighbors, NP  Aspirin-Caffeine (BAYER BACK & BODY) 500-32.5 MG TABS Take 2 tablets by mouth 2 (two) times daily as needed (pain.).   Yes [provider]  AURYXIA 1 GM 210 MG(Fe) tablet Take 630 mg by mouth See admin instructions. Take 3 tablets (630 mg) by mouth with meals and snacks 01/19/21  Yes [provider]  benzonatate (TESSALON) 100 MG capsule Take 2 capsules (200 mg total) by mouth 3 (three) times daily as needed for cough. 11/16/22  Yes Bing Neighbors, NP  megestrol (MEGACE) 20 MG tablet Take 1 tablet (20 mg total) by mouth daily. 11/02/22  Yes Lorriane Shire, MD  Methoxy PEG-Epoetin Beta (MIRCERA IJ) On dialysis Days 02/17/22 09/19/23 Yes [provider]  pantoprazole (PROTONIX) 40 MG tablet Take 1 tablet (40 mg total) by mouth daily. 01/12/22  Yes Mansouraty, Netty Starring., MD  PROAIR HFA 108 [phone] Base) MCG/ACT inhaler Inhale 1-2 puffs into the lungs every 6 (six) hours as needed for wheezing or shortness of  breath. For wheezing. 11/16/22  Yes Bing Neighbors, NP  SENSIPAR 90 MG tablet Take 90 mg by mouth daily. 06/01/22  Yes [provider]  Na Sulfate-K Sulfate-Mg Sulf (SUPREP BOWEL PREP KIT) 17.5-3.13-1.6 GM/177ML SOLN Take 1 kit by mouth as directed. For colonoscopy prep Patient not taking: Reported on 02/09/2022 01/12/22   Mansouraty, Netty Starring., MD  ondansetron (ZOFRAN) 4 MG tablet Take 1 tablet (4 mg total) by mouth every 12 (twelve) hours. Patient not taking: Reported on 02/09/2022 01/12/22   Mansouraty, Netty Starring., MD      Allergies    Pollen extract and Oxycodone-acetaminophen    Review of Systems   Review of Systems  Constitutional:  Negative for fever.  HENT:  Negative for facial swelling.   Eyes:  Negative for redness.  Respiratory:  Positive for cough and shortness of breath.   Cardiovascular:  Negative for chest pain.  Gastrointestinal:  Negative for vomiting.  All other systems reviewed and are negative.   Physical Exam Updated Vital Signs BP (!) 168/94   Pulse 97   Temp 98.8 F (37.1 C)   Resp (!) 28   Wt (!) 142 kg   SpO2 97%   BMI 46.23 kg/m  Physical Exam Vitals and nursing note reviewed.  Constitutional:      General: She is not in acute distress.    Appearance: Normal appearance. She is well-developed. She is not diaphoretic.  HENT:     Head: Normocephalic and atraumatic.  Nose: Nose normal.  Eyes:     Pupils: Pupils are equal, round, and reactive to light.  Cardiovascular:     Rate and Rhythm: Normal rate and regular rhythm.     Pulses: Normal pulses.     Heart sounds: Normal heart sounds.  Pulmonary:     Effort: Pulmonary effort is normal. No respiratory distress.     Breath sounds: Wheezing and rales present.  Abdominal:     General: Bowel sounds are normal. There is no distension.     Palpations: Abdomen is soft.     Tenderness: There is no abdominal tenderness. There is no guarding or rebound.  Musculoskeletal:        General:  Normal range of motion.     Cervical back: Normal range of motion and neck supple.  Skin:    General: Skin is warm and dry.     Capillary Refill: Capillary refill takes less than 2 seconds.     Findings: No erythema or rash.  Neurological:     General: No focal deficit present.     Mental Status: She is alert and oriented to person, place, and time.     Deep Tendon Reflexes: Reflexes normal.  Psychiatric:        Mood and Affect: Mood normal.     ED Results / Procedures / Treatments   Labs (all labs ordered are listed, but only abnormal results are displayed) Results for orders placed or performed during the hospital encounter of 11/21/22  Resp panel by RT-PCR (RSV, Flu A&B, Covid) Anterior Nasal Swab   Specimen: Anterior Nasal Swab  Result Value Ref Range   SARS Coronavirus 2 by RT PCR NEGATIVE NEGATIVE   Influenza A by PCR NEGATIVE NEGATIVE   Influenza B by PCR NEGATIVE NEGATIVE   Resp Syncytial Virus by PCR NEGATIVE NEGATIVE  CBC with Differential  Result Value Ref Range   WBC 12.0 (H) 4.0 - 10.5 K/uL   RBC 3.00 (L) 3.87 - 5.11 MIL/uL   Hemoglobin 10.0 (L) 12.0 - 15.0 g/dL   HCT 03.4 (L) 74.2 - 59.5 %   MCV 101.3 (H) 80.0 - 100.0 fL   MCH 33.3 26.0 - 34.0 pg   MCHC 32.9 30.0 - 36.0 g/dL   RDW 63.8 (H) 75.6 - 43.3 %   Platelets 200 150 - 400 K/uL   nRBC 0.0 0.0 - 0.2 %   Neutrophils Relative % 81 %   Neutro Abs 9.6 (H) 1.7 - 7.7 K/uL   Lymphocytes Relative 11 %   Lymphs Abs 1.3 0.7 - 4.0 K/uL   Monocytes Relative 5 %   Monocytes Absolute 0.6 0.1 - 1.0 K/uL   Eosinophils Relative 3 %   Eosinophils Absolute 0.4 0.0 - 0.5 K/uL   Basophils Relative 0 %   Basophils Absolute 0.1 0.0 - 0.1 K/uL   Immature Granulocytes 0 %   Abs Immature Granulocytes 0.05 0.00 - 0.07 K/uL  Basic metabolic panel  Result Value Ref Range   Sodium 140 135 - 145 mmol/L   Potassium 3.9 3.5 - 5.1 mmol/L   Chloride 103 98 - 111 mmol/L   CO2 21 (L) 22 - 32 mmol/L   Glucose, Bld 98 70 - 99  mg/dL   BUN 68 (H) 6 - 20 mg/dL   Creatinine, Ser 29.51 (H) 0.44 - 1.00 mg/dL   Calcium 9.4 8.9 - 88.4 mg/dL   GFR, Estimated 3 (L) >60 mL/min   Anion gap 16 (H) 5 - 15  Brain natriuretic peptide  Result Value Ref Range   B Natriuretic Peptide 2,900.8 (H) 0.0 - 100.0 pg/mL   DG Chest 2 View  Result Date: 11/21/2022 CLINICAL DATA:  Shortness of breath and cough for 2 weeks EXAM: CHEST - 2 VIEW COMPARISON:  05/31/2018 FINDINGS: Cardiac shadow is enlarged. Vascular congestion is noted with mild interstitial edema. No focal infiltrate or effusion is seen. No bony abnormality is noted. IMPRESSION: Changes of CHF. This may be in part due to volume overload from patient's known dialysis history. Electronically Signed   By: Alcide Clever M.D.   On: 11/21/2022 23:16    EKG EKG Interpretation Date/Time:  Sunday November 21 2022 23:01:18 EST Ventricular Rate:  97 PR Interval:  135 QRS Duration:  115 QT Interval:  405 QTC Calculation: 515 R Axis:   9  Text Interpretation: Sinus rhythm Ventricular premature complex Probable left atrial enlargement Left ventricular hypertrophy Confirmed by Stephannie Broner ([id number]) on 11/22/2022 2:22:28 AM  Radiology DG Chest 2 View  Result Date: 11/21/2022 CLINICAL DATA:  Shortness of breath and cough for 2 weeks EXAM: CHEST - 2 VIEW COMPARISON:  05/31/2018 FINDINGS: Cardiac shadow is enlarged. Vascular congestion is noted with mild interstitial edema. No focal infiltrate or effusion is seen. No bony abnormality is noted. IMPRESSION: Changes of CHF. This may be in part due to volume overload from patient's known dialysis history. Electronically Signed   By: Alcide Clever M.D.   On: 11/21/2022 23:16    Procedures Procedures    Medications Ordered in ED Medications  furosemide (LASIX) injection 40 mg (40 mg Intravenous Given 11/22/22 0228)    ED Course/ Medical Decision Making/ A&P                                 Medical Decision Making Patient with ESRD  with SOB  Amount and/or Complexity of Data Reviewed External Data Reviewed: notes.    Details: Previous notes reviewed  Labs: ordered.    Details: BNP elevated > 2900.8, elevated white count 12, hemoglobin low 10, normal platelets.  Negative covid and flu, normal sodium 140, normal potassium 3.9, elevated Creatinine 12.74  Radiology: ordered and independent interpretation performed.    Details: Pulmonary edema on CXR ECG/medicine tests: ordered and independent interpretation performed. Decision-making details documented in ED Course.  Risk Prescription drug management. Decision regarding hospitalization. Risk Details: Admit for SOB and pulmonary edema     Final Clinical Impression(s) / ED Diagnoses Final diagnoses:  Acute pulmonary edema (HCC)   The patient appears reasonably stabilized for admission considering the current resources, flow, and capabilities available in the ED at this time, and I doubt any other [facility] requiring further screening and/or treatment in the ED prior to admission.     Joseeduardo Brix, MD 11/22/22 [phone]

## 2022-11-22 NOTE — Progress Notes (Signed)
PHARMACY NOTE:  ANTIMICROBIAL RENAL DOSAGE ADJUSTMENT  Current antimicrobial regimen includes a mismatch between antimicrobial dosage and estimated renal function.  As per policy approved by the Pharmacy & Therapeutics and Medical Executive Committees, the antimicrobial dosage will be adjusted accordingly.  Current antimicrobial dosage:  Augmeint 875 mg BID   Indication: PNA   Renal Function:  Estimated Creatinine Clearance: 8.2 mL/min (A) (by C-G formula based on SCr of 12.74 mg/dL (H)). []      On intermittent HD, scheduled: []      On CRRT    Antimicrobial dosage has been changed to:   - Augmentin 500 mg BID   Additional comments:   Adalberto Cole, PharmD, BCPS 11/22/2022 12:49 PM

## 2022-11-22 NOTE — Progress Notes (Signed)
Heart Failure Navigator Progress Note  Assessed for Heart & Vascular TOC clinic readiness.  Patient does not meet criteria due to ESRD on hemodialysis.   Navigator will sign off at this time.    Dawn Fields, BSN, RN Heart Failure Nurse Navigator Secure Chat Only   

## 2022-11-22 NOTE — ED Notes (Signed)
RN @bedside  now.

## 2022-11-23 ENCOUNTER — Inpatient Hospital Stay (HOSPITAL_COMMUNITY): Payer: 59

## 2022-11-23 ENCOUNTER — Other Ambulatory Visit: Payer: 59 | Admitting: Psychology

## 2022-11-23 DIAGNOSIS — I5023 Acute on chronic systolic (congestive) heart failure: Secondary | ICD-10-CM

## 2022-11-23 DIAGNOSIS — I5043 Acute on chronic combined systolic (congestive) and diastolic (congestive) heart failure: Secondary | ICD-10-CM | POA: Diagnosis not present

## 2022-11-23 LAB — ECHOCARDIOGRAM COMPLETE
AR max vel: 3.95 cm2
AV Peak grad: 6.5 mm[Hg]
Ao pk vel: 1.27 m/s
Area-P 1/2: 6.71 cm2
Height: 69 in
MV M vel: 3.47 m/s
MV Peak grad: 48.1 mm[Hg]
S' Lateral: 6.7 cm
Weight: 4624.37 [oz_av]

## 2022-11-23 LAB — PHOSPHORUS: Phosphorus: 4.8 mg/dL — ABNORMAL HIGH (ref 2.5–4.6)

## 2022-11-23 LAB — ALBUMIN: Albumin: 3.5 g/dL (ref 3.5–5.0)

## 2022-11-23 NOTE — Progress Notes (Signed)
Discharge instructions reviewed with pt.  Copy of instructions given to pt. No new scripts.  Plan for pt to go down to the discharge lounge while she waits for her ride, she has called for ride. Pt getting dressed at this time.  Pt to be d/c'd via wheelchair with belongings, to be escorted by discharge lounge transport.  Timberlee Roblero,RN SWOT

## 2022-11-23 NOTE — Discharge Summary (Addendum)
Physician Discharge Summary  [name] VWU:[id number] DOB: 1975/05/25 DOA: 11/21/2022  PCP: [name], MD  Admit date: 11/21/2022 Discharge date: 11/23/2022  Time spent: 45 minutes  Recommendations for Outpatient Follow-up:  Outpatient dialysis 11/20, please continue to lower dry weight as tolerated   Discharge Diagnoses:  Principal Problem:   Acute on chronic combined systolic and diastolic CHF (congestive heart failure) (HCC) Active Problems:   Mixed hyperlipidemia   MITRAL REGURGITATION   Dilated cardiomyopathy (HCC)   Essential hypertension   Anemia in chronic kidney disease (CKD)   ESRD on dialysis (HCC)   Body mass index (BMI) 45.0-49.9, adult [facility])   Discharge Condition: Improved  Diet recommendation: Renal  Filed Weights   11/22/22 2135 11/23/22 0327 11/23/22 0908  Weight: 131 kg 131.1 kg 131.1 kg    History of present illness:  47 y.o. year-old w/ PMH as below who presented to ED for progressive DOE and cough last 2-3 days. CXR shows pulm edema. SpO2 mid 90s on RA. No distress. Has not missed HD. In ED BP 160/ 90-100, HR 95, RR 22-29, afeb.  [place] 2 L w/ sats 100%.  CXR showing mod severity bilat pulm edema.   Hospital Course:  Acute hypoxic resp failure - -admitted with hypoxia and pulmonary edema, patient has been losing body weight and needs dry weight lowered, improved after extra dialysis overnight, post HD weight was 131 kg, plan to set new dry weight per nephrology as 130 kg for tomorrow and then challenge to 0.5 kg lower for the next 3 HD sessions -Weaned off O2 at discharge   Chronic combined CHF -Volume managed with dialysis -Echo just completed this morning before discharge, results are pending at the time of discharge, please follow-up on echo report  ESRD - on HD MWF. As above.  Dialyzed yesterday  HTN -stable, not on meds at this time  Anemia of chronic disease Stable  Morbid obesity -Advised diet and lifestyle  modification  Discharge Exam: Vitals:   11/23/22 0327 11/23/22 0815  BP: (!) 140/80 132/89  Pulse: 87 85  Resp: 20 20  Temp: 99.2 F (37.3 C)   SpO2: 90% 100%   Gen: Awake, Alert, Oriented X 3,  HEENT: no JVD Lungs: Good air movement bilaterally, CTAB CVS: S1S2/RRR Abd: soft, Non tender, non distended, BS present Extremities: No edema Skin: no new rashes on exposed skin   Discharge Instructions   Discharge Instructions     Diet - low sodium heart healthy   Complete by: As directed    Increase activity slowly   Complete by: As directed       Allergies as of 11/23/2022       Reactions   Pollen Extract Itching   Unknown   Oxycodone-acetaminophen Itching   Tolerates with benadryl         Medication List     STOP taking these medications    amoxicillin-clavulanate 875-125 MG tablet Commonly known as: AUGMENTIN       TAKE these medications    Auryxia 1 GM 210 MG(Fe) tablet Generic drug: ferric citrate Take 630 mg by mouth See admin instructions. Take 3 tablets (630 mg) by mouth with meals and snacks   Bayer Back & Body 500-32.5 MG Tabs Generic drug: Aspirin-Caffeine Take 2 tablets by mouth 2 (two) times daily as needed (pain.).   benzonatate 100 MG capsule Commonly known as: TESSALON Take 2 capsules (200 mg total) by mouth 3 (three) times daily as needed for cough.  megestrol 20 MG tablet Commonly known as: MEGACE Take 1 tablet (20 mg total) by mouth daily.   MIRCERA IJ On dialysis Days   pantoprazole 40 MG tablet Commonly known as: Protonix Take 1 tablet (40 mg total) by mouth daily.   ProAir HFA 108 (90 Base) MCG/ACT inhaler Generic drug: albuterol Inhale 1-2 puffs into the lungs every 6 (six) hours as needed for wheezing or shortness of breath. For wheezing.   Sensipar 90 MG tablet Generic drug: cinacalcet Take 90 mg by mouth daily.       Allergies  Allergen Reactions   Pollen Extract Itching    Unknown    Oxycodone-Acetaminophen Itching    Tolerates with benadryl       The results of significant diagnostics from this hospitalization (including imaging, microbiology, ancillary and laboratory) are listed below for reference.    Significant Diagnostic Studies: DG Chest 2 View  Result Date: 11/21/2022 CLINICAL DATA:  Shortness of breath and cough for 2 weeks EXAM: CHEST - 2 VIEW COMPARISON:  05/31/2018 FINDINGS: Cardiac shadow is enlarged. Vascular congestion is noted with mild interstitial edema. No focal infiltrate or effusion is seen. No bony abnormality is noted. IMPRESSION: Changes of CHF. This may be in part due to volume overload from patient's known dialysis history. Electronically Signed   By: Alcide Clever M.D.   On: 11/21/2022 23:16    Microbiology: Recent Results (from the past 240 hour(s))  Resp panel by RT-PCR (RSV, Flu A&B, Covid) Anterior Nasal Swab     Status: None   Collection Time: 11/21/22 11:40 PM   Specimen: Anterior Nasal Swab  Result Value Ref Range Status   SARS Coronavirus 2 by RT PCR NEGATIVE NEGATIVE Final    Comment: (NOTE) SARS-CoV-2 target nucleic acids are NOT DETECTED.  The SARS-CoV-2 RNA is generally detectable in upper respiratory specimens during the acute phase of infection. The lowest concentration of SARS-CoV-2 viral copies this assay can detect is 138 copies/mL. A negative result does not preclude SARS-Cov-2 infection and should not be used as the sole basis for treatment or other patient management decisions. A negative result may occur with  improper specimen collection/handling, submission of specimen other than nasopharyngeal swab, presence of viral mutation(s) within the areas targeted by this assay, and inadequate number of viral copies(<138 copies/mL). A negative result must be combined with clinical observations, patient history, and epidemiological information. The expected result is Negative.  Fact Sheet for Patients:   BloggerCourse.com  Fact Sheet for Healthcare Providers:  SeriousBroker.it  This test is no t yet approved or cleared by the [place] FDA and  has been authorized for detection and/or diagnosis of SARS-CoV-2 by FDA under an Emergency Use Authorization (EUA). This EUA will remain  in effect (meaning this test can be used) for the duration of the COVID-19 declaration under Section 564(b)(1) of the Act, 21 U.S.C.section 360bbb-3(b)(1), unless the authorization is terminated  or revoked sooner.       Influenza A by PCR NEGATIVE NEGATIVE Final   Influenza B by PCR NEGATIVE NEGATIVE Final    Comment: (NOTE) The Xpert Xpress SARS-CoV-2/FLU/RSV plus assay is intended as an aid in the diagnosis of influenza from Nasopharyngeal swab specimens and should not be used as a sole basis for treatment. Nasal washings and aspirates are unacceptable for Xpert Xpress SARS-CoV-2/FLU/RSV testing.  Fact Sheet for Patients: BloggerCourse.com  Fact Sheet for Healthcare Providers: SeriousBroker.it  This test is not yet approved or cleared by the [place] and  has been authorized for detection and/or diagnosis of SARS-CoV-2 by FDA under an Emergency Use Authorization (EUA). This EUA will remain in effect (meaning this test can be used) for the duration of the COVID-19 declaration under Section 564(b)(1) of the Act, 21 U.S.C. section 360bbb-3(b)(1), unless the authorization is terminated or revoked.     Resp Syncytial Virus by PCR NEGATIVE NEGATIVE Final    Comment: (NOTE) Fact Sheet for Patients: BloggerCourse.com  Fact Sheet for Healthcare Providers: SeriousBroker.it  This test is not yet approved or cleared by the [place] FDA and has been authorized for detection and/or diagnosis of SARS-CoV-2 by FDA under an Emergency Use  Authorization (EUA). This EUA will remain in effect (meaning this test can be used) for the duration of the COVID-19 declaration under Section 564(b)(1) of the Act, 21 U.S.C. section 360bbb-3(b)(1), unless the authorization is terminated or revoked.  Performed at [facility], [address], [place], [place] [address]      Labs: Basic Metabolic Panel: Recent Labs  Lab 11/21/22 2340 11/22/22 0222  NA 140  --   K 3.9  --   CL 103  --   CO2 21*  --   GLUCOSE 98  --   BUN 68*  --   CREATININE 12.74*  --   CALCIUM 9.4  --   MG  --  2.3  PHOS  --  7.8*   Liver Function Tests: No results for input(s): "AST", "ALT", "ALKPHOS", "BILITOT", "PROT", "ALBUMIN" in the last 168 hours. No results for input(s): "LIPASE", "AMYLASE" in the last 168 hours. No results for input(s): "AMMONIA" in the last 168 hours. CBC: Recent Labs  Lab 11/21/22 2340  WBC 12.0*  NEUTROABS 9.6*  HGB 10.0*  HCT 30.4*  MCV 101.3*  PLT 200   Cardiac Enzymes: No results for input(s): "CKTOTAL", "CKMB", "CKMBINDEX", "TROPONINI" in the last 168 hours. BNP: BNP (last 3 results) Recent Labs    03/24/22 1927 11/21/22 2340  BNP 1,484.7* 2,900.8*    ProBNP (last 3 results) No results for input(s): "PROBNP" in the last 8760 hours.  CBG: No results for input(s): "GLUCAP" in the last 168 hours.     Signed:  Zannie Cove MD.  [facility] 11/23/2022, 10:51 AM

## 2022-11-23 NOTE — Progress Notes (Signed)
[facility] Progress Note  Subjective: 3.5 L off w/ HD, off of O2. Lungs clear. Ready for dc.   Vitals:   11/23/22 0011 11/23/22 0327 11/23/22 0815 11/23/22 0908  BP: 130/63 (!) 140/80 132/89   Pulse: 92 87 85   Resp: (!) 22 20 20    Temp: 99.2 F (37.3 C) 99.2 F (37.3 C)    TempSrc: Oral Oral    SpO2: 95% 90% 100%   Weight:  131.1 kg  131.1 kg  Height:        Exam: Gen alert, no distress, on RA No jvd or bruits Chest CTA today RRR no MRG Abd soft ntnd no mass or ascites +bs Ext  no edema, Neuro is alert, Ox 3 , nf    LUA AVF+ bruit      Renal-related home meds: - auryxia 3 ac tid - megace 20 every day - sensipar 90 mg every day - others: inhalers, po abx, prn zofran      [facility] HD: [facility]   4.5h   450/800   132.5kg   2K/ 2.5Ca bath  LUA AVF     Assessment/ Plan: Acute hypoxic resp failure - CXR w/ sig edema, pt is losing body wt. Also cut short last HD on Friday due to apartment issues. Had HD last evening w/ 3.5 liters off. Post hd wt was 131kg. Okay for discharge today.  Will set new dry wt at 130kg for tomorrow then challenge 0.5kg lower for the next 3 hd sessions.  ESKD - on HD MWF. As above.  HTN - bp's up, not on any BP lowering meds it appears.  Volume - vol excess as above w/ pulm edema.  Anemia of eskd - Hb 10, follow MBD ckd - Ca in range. Binders w/ meals. Cont sensipar.  Dispo - ok for discharge      Abigail Moselle MD  [facility] 11/23/2022, 10:13 AM  Recent Labs  Lab 11/21/22 2340 11/22/22 0222  HGB 10.0*  --   CALCIUM 9.4  --   PHOS  --  7.8*  CREATININE 12.74*  --   K 3.9  --    No results for input(s): "IRON", "TIBC", "FERRITIN" in the last 168 hours. Inpatient medications:  amoxicillin-clavulanate  1 tablet Oral BID   Chlorhexidine Gluconate Cloth  6 each Topical Q0600   cinacalcet  90 mg Oral Q breakfast   ferric citrate  630 mg Oral TID   megestrol  20 mg Oral Daily   pantoprazole  40 mg Oral Daily    albuterol,  benzonatate, melatonin, ondansetron (ZOFRAN) IV

## 2022-11-23 NOTE — Progress Notes (Signed)
D/C order noted. Contacted [facility] to advise clinic of pt's d/c today and that pt should resume care tomorrow.   Leydy Worthey Renal Navigator [phone] 

## 2022-11-23 NOTE — TOC Transition Note (Addendum)
Transition of Care [facility]) - CM/SW Discharge Note   Patient Details  Name: [name] MRN: [id number] Date of Birth: 11/05/1975  [facility]) CM/SW Contact:  Leone Haven, RN Phone Number: 11/23/2022, 8:43 AM   Clinical Narrative:    For dc , has no needs. She has transportation, she still works, she is not homebound.  She states she will get her a scale to weigh herself daily, she has a bp cuff , she eats a no salt diet.   Final next level of care: Home/Self Care Barriers to Discharge: No Barriers Identified   Patient Goals and CMS Choice   Choice offered to / list presented to : NA  Discharge Placement                         Discharge Plan and Services Additional resources added to the After Visit Summary for   In-house Referral: NA Discharge Planning Services: CM Consult Post Acute Care Choice: NA            DME Agency: NA       HH Arranged: NA          Social Determinants of Health (SDOH) Interventions SDOH Screenings   Food Insecurity: No Food Insecurity (11/23/2022)  Housing: Patient Declined (11/23/2022)  Transportation Needs: No Transportation Needs (11/23/2022)  Utilities: Not At Risk (11/23/2022)  Alcohol Screen: Low Risk  (02/09/2022)  Depression (PHQ2-9): Low Risk  (10/21/2020)  Financial Resource Strain: Low Risk  (02/09/2022)  Physical Activity: Insufficiently Active (02/09/2022)  Social Connections: Moderately Integrated (02/09/2022)  Stress: No Stress Concern Present (02/09/2022)  Tobacco Use: Low Risk  (11/21/2022)     Readmission Risk Interventions     No data to display

## 2022-11-23 NOTE — Progress Notes (Signed)
Echocardiogram 2D Echocardiogram has been performed.  [name] 11/23/2022, 11:09 AM

## 2022-11-23 NOTE — Discharge Planning (Signed)
[facility] Patient Discharge Orders- [facility] CLINIC: [place]  Patient's name: [name] Admit/DC Dates: 11/21/2022 - 11/23/2022  Discharge Diagnoses: Acute Hypoxic Respiratory Failure  Volume overload  Aranesp: Given: No   Date and amount of last dose: NA  Last Hgb: 10 PRBC's Given: No Date/# of units: NA ESA dose for discharge: mircera 100 mcg IV q 2 weeks  IV Iron dose at discharge: per protocol  Heparin change: No  EDW Change: Yes New EDW: Lower EDW to 130 kg then Challenge AND LOWER EDW 0.5 kg X 3 treatments  Bath Change: No  Access intervention/Change: No Details:  Hectorol/Calcitriol change: No  Discharge Labs: Calcium 9.4 Phosphorus 4.8 Albumin 3.5 K+ 3.9  IV Antibiotics: NA Details:  On Coumadin?: NA Last INR: Next INR: Managed By:   OTHER/APPTS/LAB ORDERS:    D/C Meds to be reconciled by nurse after every discharge.  Completed By: Alonna Buckler [facility] [phone]    Reviewed by: MD:______ RN_______

## 2022-11-23 NOTE — TOC Initial Note (Signed)
Transition of Care [facility]) - Initial/Assessment Note    Patient Details  Name: [name] MRN: [id number] Date of Birth: Feb 23, 1975  [facility]) CM/SW Contact:    Leone Haven, RN Phone Number: 11/23/2022, 8:42 AM  Clinical Narrative:                 From home alone, has PCP and insurance on file, states has no HH services in place at this time or DME at home.  States friend, Danford Bad will transport her  home at [employer] and Celine Ahr, Angela Cox is support system, states gets medications from [facility] on [address].  Pta self ambulatory .  Expected Discharge Plan: Home/Self Care Barriers to Discharge: No Barriers Identified   Patient Goals and CMS Choice Patient states their goals for this hospitalization and ongoing recovery are:: return home   Choice offered to / list presented to : NA      Expected Discharge Plan and Services In-house Referral: NA Discharge Planning Services: CM Consult Post Acute Care Choice: NA Living arrangements for the past 2 months: Apartment Expected Discharge Date: 11/23/22                 DME Agency: NA       HH Arranged: NA          Prior Living Arrangements/Services Living arrangements for the past 2 months: Apartment Lives with:: Self Patient language and need for interpreter reviewed:: Yes Do you feel safe going back to the place where you live?: Yes      Need for Family Participation in Patient Care: Yes (Comment) (aunt) Care giver support system in place?: Yes (comment)   Criminal Activity/Legal Involvement Pertinent to Current Situation/Hospitalization: No - Comment as needed  Activities of Daily Living      Permission Sought/Granted Permission sought to share information with : Case Manager Permission granted to share information with : Yes, Verbal Permission Granted              Emotional Assessment Appearance:: Appears stated age Attitude/Demeanor/Rapport: Engaged Affect (typically observed):  Accepting Orientation: : Oriented to Self, Oriented to Place, Oriented to  Time, Oriented to Situation Alcohol / Substance Use: Not Applicable Psych Involvement: No (comment)  Admission diagnosis:  Acute on chronic systolic heart failure (HCC) [I50.23] Acute pulmonary edema (HCC) [J81.0] Patient Active Problem List   Diagnosis Date Noted   Acute on chronic systolic heart failure (HCC) 11/22/2022   Acute hypoxic respiratory failure (HCC) 03/24/2022   Acute on chronic combined systolic and diastolic CHF (congestive heart failure) (HCC) 03/24/2022   Acute respiratory failure with hypoxia (HCC) 03/24/2022   Calculus of gallbladder without cholecystitis without obstruction 01/15/2022   Pyrosis 01/15/2022   Colon cancer screening 01/15/2022   Belching 01/15/2022   Left upper quadrant abdominal pain 01/15/2022   Complication of vascular dialysis catheter 10/21/2020   ESRD on dialysis (HCC) 10/21/2020   Iron deficiency anemia, unspecified 02/17/2018   Gout, unspecified 02/15/2018   Unspecified lump in the left breast, unspecified quadrant 02/15/2018   Body mass index (BMI) 45.0-49.9, adult (HCC) 02/15/2018   Superficial mycosis, unspecified 02/15/2018   Other seasonal allergic rhinitis 02/15/2018   Secondary hyperparathyroidism of renal origin (HCC) 02/14/2018   Anemia in chronic kidney disease (CKD) 07/31/2015   Essential hypertension 02/27/2014   Menorrhagia 01/27/2013   Mixed hyperlipidemia 04/20/2008   OBESITY-MORBID (>100') 04/20/2008   MITRAL REGURGITATION 04/20/2008   Dilated cardiomyopathy (HCC) 04/20/2008   PCP:  [name], MD  Pharmacy:   [facility] [address] [place] - [address] AT [facility] [address] [place] [address] Phone: [phone] Fax: [phone]  [facility] [address] [place] [address] Phone: [phone] Fax: [phone]     Social Determinants of Health (SDOH) Social  History: SDOH Screenings   Food Insecurity: No Food Insecurity (11/23/2022)  Housing: Patient Declined (11/23/2022)  Transportation Needs: No Transportation Needs (11/23/2022)  Utilities: Not At Risk (11/23/2022)  Alcohol Screen: Low Risk  (02/09/2022)  Depression (PHQ2-9): Low Risk  (10/21/2020)  Financial Resource Strain: Low Risk  (02/09/2022)  Physical Activity: Insufficiently Active (02/09/2022)  Social Connections: Moderately Integrated (02/09/2022)  Stress: No Stress Concern Present (02/09/2022)  Tobacco Use: Low Risk  (11/21/2022)   SDOH Interventions:     Readmission Risk Interventions     No data to display

## 2022-11-24 ENCOUNTER — Telehealth: Payer: Self-pay | Admitting: Nurse Practitioner

## 2022-11-24 LAB — HEPATITIS B SURFACE ANTIBODY, QUANTITATIVE: Hep B S AB Quant (Post): 38.7 m[IU]/mL

## 2022-11-25 NOTE — Progress Notes (Signed)
DAP note Client name: [name] Therapist name: Letta Moynahan [name] Date: 10/21/2022 Time: 60 mins  DATA: Client reports, "I am doing okay. I have just been working and spending time with my family and the dogs. I am still feeling pretty good and I have been going to dialysis most days. I really do know that helps me to feel okay." The client also said that she was going to go and visit her husband for the first time but could not make it because she had to go and help her family with something. She reported that she had not told him so she was just going to try and reschedule as soon as she can. This week she reports she has made it to a 2.  ASSESSMENT: The client appeared to be going along with the whole one day at a time theme. She seems to physically be feeling pretty good and so that is continuing to help her mental. She did not get to see her husband this past week and so that continues to stay at a safe distance for the moment. She is just taking one day at a time for now.   PLAN: Client will attend therapy each week, with a goal of 3 sessions per month. Client will attend dialysis 3 times per week with a goal of 3 times per week to maintain her best overall health. Client reports that when she feels depressed or lonely she feels like this is a level 8 and would like to bring this down to a level 3. We continue to work mindfulness and box breathing to try and assist with this decrease. We discussed the importance of taking the time to check in daily with herself and take the moment to evaluate her needs and how to execute them.

## 2022-11-30 ENCOUNTER — Ambulatory Visit (INDEPENDENT_AMBULATORY_CARE_PROVIDER_SITE_OTHER): Payer: 59 | Admitting: Psychology

## 2022-11-30 DIAGNOSIS — F321 Major depressive disorder, single episode, moderate: Secondary | ICD-10-CM | POA: Diagnosis not present

## 2022-12-09 ENCOUNTER — Ambulatory Visit: Payer: 59 | Admitting: Psychology

## 2022-12-09 DIAGNOSIS — F321 Major depressive disorder, single episode, moderate: Secondary | ICD-10-CM | POA: Diagnosis not present

## 2022-12-10 NOTE — Progress Notes (Signed)
Comprehensive Clinical Assessment (CCA) Note 11/09/2022 [name] [id number]   Chief Complaint: Moderate depressive disorder Visit Diagnosis: Moderate depressive disorder     CCA Screening, Triage and Referral (STR)   Patient Reported Information How did you hear about [place]? Neighbor Referral name: Boyce Medici Referral phone number:    Whom do you see for routine medical problems? [name] Practice/Facility Name: Internal Medicine Practice/Facility Phone Number: [phone] Name of Contact: No data recorded Contact Number: No data recorded Contact Fax Number: No data recorded Prescriber Name: No data recorded Prescriber Address (if known): No data recorded   What Is the Reason for Your Visit/Call Today? Therapy How Long Has This Been Causing You Problems? Several months What Do You Feel Would Help You the Most Today? Talk to someone, get feelings out   Have You Recently Been in Any Inpatient Treatment (Hospital/Detox/Crisis Center/28-Day Program)? No Name/Location of Program/Hospital:No data recorded How Long Were You There? No data recorded When Were You Discharged? No data recorded   Have You Ever Received Services From [facility] Before? Yes Who Do You See at [facility]? Hospital, doctors   Have You Recently Had Any Thoughts About Hurting Yourself? No Are You Planning to Commit Suicide/Harm Yourself At This time? No   Have you Recently Had Thoughts About Hurting Someone [name]? No Explanation: No data recorded   Have You Used Any Alcohol or Drugs in the Past 24 Hours? No How Long Ago Did You Use Drugs or Alcohol?  What Did You Use and How Much? No data recorded   Do You Currently Have a Therapist/Psychiatrist? Yes Name of Therapist/Psychiatrist: Roslynn Amble, LCSWA   Have You Been Recently Discharged From Any Office Practice or Programs? No Explanation of Discharge From Practice/Program: No data recorded                CCA  Screening Triage Referral Assessment Type of Contact: In person Is this Initial or Reassessment? Reassessment Date Telepsych consult ordered in [facility]: 11/09/2022 Time Telepsych consult ordered in [facility]:    Patient Reported Information Reviewed?  Patient Left Without Being Seen? No Reason for Not Completing Assessment: N/A   Collateral Involvement: No data recorded   Does Patient Have a Court Appointed Legal Guardian? No Name and Contact of Legal Guardian: No data recorded If Minor and Not Living with Parent(s), Who has Custody? No data recorded Is CPS involved or ever been involved? No data recorded Is APS involved or ever been involved? No   Patient Determined To Be At Risk for Harm To Self or Others Based on Review of Patient Reported Information or Presenting Complaint? No Method: N/A Availability of Means: No data recorded Intent: No data recorded Notification Required: No data recorded Additional Information for Danger to Others Potential: No data recorded Additional Comments for Danger to Others Potential: No data recorded Are There Guns or Other Weapons in Your Home? No data recorded Types of Guns/Weapons: No data recorded Are These Weapons Safely Secured?                                                        No data recorded Who Could Verify You Are Able To Have These Secured: No data recorded Do You Have any Outstanding Charges, Pending Court Dates, Parole/Probation? No data recorded Contacted To  Inform of Risk of Harm To Self or Others: No data recorded   Location of Assessment: [facility]   Does Patient Present under Involuntary Commitment? No IVC Papers Initial File Date: No   County of Residence: [place]   Patient Currently Receiving the Following Services: No data recorded   Determination of Need: No data recorded   Options For Referral: No data recorded       CCA Biopsychosocial Intake/Chief Complaint:  Moderate depressive disorder Current Symptoms/Problems:  lonely, sometimes feels down & depressed   Patient Reported Schizophrenia/Schizoaffective Diagnosis in Past: None   Strengths: Smart, hard-working, reliable, trustworthy, independent Preferences: stability, support, independence Abilities: Works hard, emotional intelligence, adaptable, patient   Type of Services Patient Feels are Needed: weekly therapy   Initial Clinical Notes/Concerns: Client seems well adjusted. Looking forward to a therapeutic relationship, this is really something that the client needs.   Mental Health Symptoms Depression:  Yes  Duration of Depressive symptoms: Months  Mania:  None  Anxiety:   Mild  Psychosis:  None  Duration of Psychotic symptoms: None  Trauma:  Yes  Obsessions:  None  Compulsions:  None  Inattention:  None  Hyperactivity/Impulsivity:  None  Oppositional/Defiant Behaviors:  None  Emotional Irregularity:  Normal range  Other Mood/Personality Symptoms:  None    Mental Status Exam Appearance and self-care  Stature: 5 foot 9 inches  Weight: 313 pounds  Clothing:  clean  Grooming:  well groomed  Cosmetic use:  none  Posture/gait:  normal range  Motor activity:  normal range  Sensorium  Attention:  normal range  Concentration:  normal range  Orientation:  normal range  Recall/memory:  normal range  Affect and Mood  Affect:  Pleasant  Mood:  Pleasant  Relating  Eye contact:  normal range  Facial expression:  normal range  Attitude toward examiner:  Pleasant  Thought and Language  Speech flow: normal range  Thought content:  normal range  Preoccupation:  normal range  Hallucinations:  none  Organization:  normal range  [employer] of Knowledge:  well versed  Intelligence:  normal range  Abstraction:  normal range  Judgement:  normal range  Reality Testing:  normal range  Insight:  normal range  Decision Making:  normal range  Social Functioning  Social Maturity:  normal range  Social Judgement:  normal range   Stress  Stressors: dialysis, health, being lonely  Coping Ability:  copes well, adjust well  Skill Deficits:  n/a  Supports:  family, animals, job, therapy      Religion: Christian   Leisure/Recreation: hanging out with family, relaxing with dogs    Exercise/Diet:      CCA Employment/Education Employment/Work Situation: Education officer, 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 Had a relatively good childhood with normal ups and downs.    Childhood History:    Child/Adolescent Assessment:     CCA Substance Use Alcohol/Drug Use: none           ASAM's:  Six Dimensions of Multidimensional Assessment   Dimension 1:  Acute Intoxication and/or Withdrawal Potential:    Dimension 2:  Biomedical Conditions and Complications:    Dimension 3:  Emotional, Behavioral, or Cognitive Conditions and Complications:     Dimension 4:  Readiness to Change:     Dimension 5:  Relapse, Continued use, or Continued Problem Potential:     Dimension 6:  Recovery/Living Environment:     ASAM Severity Score:    ASAM Recommended Level of Treatment:      Substance use Disorder (SUD)  none   Recommendations for Services/Supports/Treatments:  Continued therapy   DSM5 Diagnoses:     Patient Active Problem List    Diagnosis Date Noted   Acute hypoxic respiratory failure (HCC) 03/24/2022   Acute on chronic combined systolic and diastolic CHF (congestive heart failure) (HCC) 03/24/2022   Acute respiratory failure with hypoxia (HCC) 03/24/2022   Calculus of gallbladder without cholecystitis without obstruction 01/15/2022   Pyrosis 01/15/2022   Colon cancer screening 01/15/2022   Belching 01/15/2022   Left upper quadrant abdominal pain 01/15/2022   ESRD on dialysis (HCC) 10/21/2020   Iron deficiency anemia,  unspecified 02/17/2018   Gout, unspecified 02/15/2018   Anemia in chronic kidney disease (CKD) 07/31/2015   Essential hypertension 02/27/2014   Menorrhagia 01/27/2013   HYPERLIPIDEMIA-MIXED 04/20/2008   OBESITY-MORBID (>100') 04/20/2008   MITRAL REGURGITATION 04/20/2008   CARDIOMYOPATHY, DILATED 04/20/2008      Patient Centered Plan: Patient is on the following Treatment Plan(s):  moderate depression     Referrals to Alternative Service(s): Referred to Alternative Service(s):   Place:   Date:   Time:    Referred to Alternative Service(s):   Place:   Date:   Time:    Referred to Alternative Service(s):   Place:   Date:   Time:    Referred to Alternative Service(s):   Place:   Date:   Time:        Collaboration of Care: n/a   Patient/Guardian was advised Release of Information must be obtained prior to any record release in order to collaborate their care with an outside provider. Patient/Guardian was advised if they have not already done so to contact the registration department to sign all necessary forms in order for [place] to release information regarding their care.    Consent: Patient/Guardian gives verbal consent for treatment and assignment of benefits for services provided during this visit. Patient/Guardian expressed understanding and agreed to proceed.    Letta Moynahan T [place], [place]

## 2022-12-14 ENCOUNTER — Other Ambulatory Visit: Payer: 59 | Admitting: Psychology

## 2022-12-23 ENCOUNTER — Ambulatory Visit (INDEPENDENT_AMBULATORY_CARE_PROVIDER_SITE_OTHER): Payer: 59 | Admitting: Psychology

## 2022-12-23 DIAGNOSIS — F321 Major depressive disorder, single episode, moderate: Secondary | ICD-10-CM

## 2022-12-31 NOTE — Progress Notes (Signed)
DAP note Client name: [name] Therapist name: Letta Moynahan [name] Date: 11/30/2022 Time: 60 mins  DATA: Client reports, "I am doing okay. I am feeling better. What happened was that I had lost some weight and they did not adjust my kidney levels accordingly so I had to much fluid on me and I had to go get it removed. It was best to just go to the hospital to get the help that I needed." Client reported that she is back to work and looking forward to spending time with family for the holidays. Client reports that her stress level in at a 3 this week and would like to work to get it back to a 2 with the use of interventions.  ASSESSMENT: The client was in good spirits. She seemed to be feeling much better as evidence by the fact that she went to work the day that she got out of the hospital. She now knows what happened so that she can keep a better eye on it so that it does not happen again. She is looking forward to family time for the holidays and taking a few days to enjoy.    PLAN: Client will attend therapy each week, with a goal of 3 sessions per month. Client will attend dialysis 3 times per week with a goal of 3 times per week to maintain her best overall health. Client reports that when she feels depressed or lonely she feels like this is at a level 8 and would like to bring this down to a level 3. We continue to work mindfulness and box breathing to try and assist with this decrease. We discussed the importance of taking the time to check in daily with herself and take the moment to evaluate her needs and how to execute them.

## 2023-01-01 ENCOUNTER — Ambulatory Visit (HOSPITAL_COMMUNITY)
Admission: EM | Admit: 2023-01-01 | Discharge: 2023-01-01 | Disposition: A | Discharge status: Home / Self Care | Payer: 59 | Attending: Urgent Care | Admitting: Urgent Care

## 2023-01-01 ENCOUNTER — Encounter (HOSPITAL_COMMUNITY): Payer: Self-pay | Admitting: *Deleted

## 2023-01-01 ENCOUNTER — Other Ambulatory Visit: Payer: Self-pay

## 2023-01-01 DIAGNOSIS — J019 Acute sinusitis, unspecified: Secondary | ICD-10-CM

## 2023-01-01 DIAGNOSIS — J209 Acute bronchitis, unspecified: Secondary | ICD-10-CM

## 2023-01-01 MED ORDER — AMOXICILLIN-POT CLAVULANATE 875-125 MG PO TABS: 1.0000 | ORAL_TABLET | Freq: Two times a day (BID) | ORAL | 0 refills | Status: DC

## 2023-01-01 MED ORDER — PREDNISONE 10 MG (21) PO TBPK: ORAL_TABLET | Freq: Every day | ORAL | 0 refills | Status: DC

## 2023-01-01 MED ORDER — ALBUTEROL SULFATE HFA 108 (90 BASE) MCG/ACT IN AERS: 1.0000 | INHALATION_SPRAY | Freq: Four times a day (QID) | RESPIRATORY_TRACT | 0 refills | Status: AC | PRN

## 2023-01-01 NOTE — ED Triage Notes (Signed)
Pt reports a productive cough. Pt has green mucous when coughing and blowing her nose.

## 2023-01-01 NOTE — ED Provider Notes (Signed)
[facility]    CSN: [id number] Arrival date & time: 01/01/23  1035      History   Chief Complaint Chief Complaint  Patient presents with   Cough    HPI [name] is a 47 y.o. female.   Pleasant 47 year old female with end-stage renal disease on hemodialysis presents today due to concerns of a 1 to 1-1/2-week history of nasal congestion with postnasal drainage that is turned into a cough productive of greenish-yellow phlegm.  She also has a history of heart failure, but denies any orthopnea or paroxysmal nocturnal dyspnea.  No recent weight gain or edema in her legs.  She reports a history of sinus issues in the past and feels that her symptoms are similar.  Reports significant postnasal drainage that is causing irritation to her throat and the cough.  Her cough is productive.  She denies significant shortness of breath or wheezing.  Denies known exposure to any sick contacts.  She does have a history of pneumonia however this was many years ago.  Denies any additional URI symptoms today. Had dialysis yesterday and will have it again tomorrow.   Cough   Past Medical History:  Diagnosis Date   Anemia    Anxiety    pt not aware of this   Arthritis    Breast mass 09/01/2016   Targeted ultrasound is performed, showing a mass in the left breast at 11 o'clock, 1 cm from the nipple measuring 6.4 by 5.0 cm. The mass is heterogeneous and may contain macroscopic fat.   IMPRESSION: The mass in the left breast is probably benign, possibly a Hamartoma.  RECOMMENDATION: Recommend six-month follow-up mammogram of the probably benign left breast mass. If necessary, an ultrasound co   CHF (congestive heart failure) (HCC)    hosp. 2011   Dilated cardiomyopathy (HCC)    Encounter for blood transfusion    ESRD on hemodialysis (HCC)    [facility] MWF   Family history of adverse reaction to anesthesia     " Aunt has a tracheostomy and kept coughing after colonoscopy "   Foot  fracture    Gout    no flare up for several yrs   Headache    Heart murmur    as a child, no problems as an adult   History of blood transfusion    x 2   History of pneumonia    Hyperlipidemia    Hypertension    Menorrhagia    Morbid obesity (HCC)    Pneumonia    hosp. 2010   Shortness of breath    HX CHL - occasional. 04/09/21 patient sits down and shortness of breath susides.   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   Thyroid disease     Patient Active Problem List   Diagnosis Date Noted   Acute on chronic systolic heart failure (HCC) 11/22/2022   Acute hypoxic respiratory failure (HCC) 03/24/2022   Acute on chronic combined systolic and diastolic CHF (congestive heart failure) (HCC) 03/24/2022   Acute respiratory failure with hypoxia (HCC) 03/24/2022   Calculus of gallbladder without cholecystitis without obstruction 01/15/2022   Pyrosis 01/15/2022   Colon cancer screening 01/15/2022   Belching 01/15/2022   Left upper quadrant abdominal pain 01/15/2022   Complication of vascular dialysis catheter 10/21/2020   ESRD on dialysis (HCC) 10/21/2020  Iron deficiency anemia, unspecified 02/17/2018   Gout, unspecified 02/15/2018   Unspecified lump in the left breast, unspecified quadrant 02/15/2018   Body mass index (BMI) 45.0-49.9, adult (HCC) 02/15/2018   Superficial mycosis, unspecified 02/15/2018   Other seasonal allergic rhinitis 02/15/2018   Secondary hyperparathyroidism of renal origin (HCC) 02/14/2018   Anemia in chronic kidney disease (CKD) 07/31/2015   Essential hypertension 02/27/2014   Menorrhagia 01/27/2013   Mixed hyperlipidemia 04/20/2008   OBESITY-MORBID (>100') 04/20/2008   MITRAL REGURGITATION 04/20/2008   Dilated cardiomyopathy (HCC) 04/20/2008    Past Surgical History:  Procedure Laterality Date   A/V FISTULAGRAM  Left 08/13/2021   Procedure: A/V Fistulagram;  Surgeon: Cephus Shelling, MD;  [facility];  Service: Cardiovascular;  Laterality: Left;   ANGIOPLASTY  07/28/2015   Procedure: ANGIOPLASTY OF RIGHT BRACHIAL VEIN;  Surgeon: Fransisco Hertz, MD;  Location: [facility] OR;  Service: Vascular;;   AV FISTULA PLACEMENT  08/04/2011   Procedure: ARTERIOVENOUS (AV) FISTULA CREATION;  Surgeon: Sherren Kerns, MD;  Location: [facility] OR;  Service: Vascular;  Laterality: Right;  Creation right brachiocephalic arteriovenous fistula   AV FISTULA PLACEMENT Left 02/05/2013   Procedure: ARTERIOVENOUS FISTULA CREATION LEFT ARM;  Surgeon: Fransisco Hertz, MD;  Location: [facility] OR;  Service: Vascular;  Laterality: Left;   AV FISTULA PLACEMENT Right 06/04/2013   Procedure: ARTERIOVENOUS (AV) FISTULA CREATION;  Surgeon: Fransisco Hertz, MD;  Location: [facility] OR;  Service: Vascular;  Laterality: Right;   AV FISTULA PLACEMENT Right 04/15/2014   Procedure: INSERTION OF ARTERIOVENOUS (AV) GORE-TEX GRAFT ARM;  Surgeon: Fransisco Hertz, MD;  [facility];  Service: Vascular;  Laterality: Right;   AV FISTULA PLACEMENT Left 02/25/2021   Procedure: LEFT ARM ARTERIOVENOUS (AV) FISTULA CREATION;  Surgeon: Cephus Shelling, MD;  [facility]: [facility] OR;  Service: Vascular;  Laterality: Left;   AV FISTULA PLACEMENT Left 08/26/2021   Procedure: LEFT ARM LIGATION OF BRACHIOBASILIC FISTULA,  REPAIR PSEUDOANEURYSM;  Surgeon: Cephus Shelling, MD;  [facility]: [facility] OR;  Service: Vascular;  Laterality: Left;   AV FISTULA PLACEMENT Left 10/12/2021   Procedure: LEFT RADIOCEPHALIC ARTERIOVENOUS FISTULA  ARTERIOVENOUS (AV) FISTULA CREATION WITH INTERPOSITION GRAFT;  Surgeon: Cephus Shelling, MD;  Location[facility];  Service: Vascular;  Laterality: Left;   BASCILIC VEIN TRANSPOSITION Right 08/13/2013   Procedure: Second Stage Brachial Vein Transposition;  Surgeon: Fransisco Hertz, MD;  Location: [facility] OR;  Service: Vascular;  Laterality: Right;   BASCILIC VEIN  TRANSPOSITION Left 04/13/2021   Procedure: LEFT ARM SECOND STAGE BASILIC VEIN TRANSPOSITION;  Surgeon: Cephus Shelling, MD;  [facility];  Service: Vascular;  Laterality: Left;   CARDIAC SURGERY  1979   repair of hole in heart   GRAFT APPLICATION Left 10/12/2021   Procedure: ARTEGRAFT COLLAGEN VASCULAR GRAFT APPLICATION;  Surgeon: Cephus Shelling, MD;  Location: [facility] OR;  Service: Vascular;  Laterality: Left;   INCISION AND DRAINAGE OF WOUND Right 07/28/2015   Procedure: IRRIGATION AND DEBRIDEMENT RIGHT HAND  WOUND;  Surgeon: Knute Neu, MD;  [facility] OR;  Service: Plastics;  Laterality: Right;   LIGATION OF ARTERIOVENOUS  FISTULA Left 03/05/2013   Procedure: LIGATION OF ARTERIOVENOUS  FISTULA;  Surgeon: Fransisco Hertz, MD;  Location: [facility] OR;  Service: Vascular;  Laterality: Left;   PATCH ANGIOPLASTY Left 03/05/2013   Procedure: PATCH ANGIOPLASTY;  Surgeon: Fransisco Hertz, MD;  Location: [facility] OR;  Service: Vascular;  Laterality: Left;   PERIPHERAL VASCULAR BALLOON  ANGIOPLASTY Left 08/13/2021   Procedure: PERIPHERAL VASCULAR BALLOON ANGIOPLASTY;  Surgeon: Cephus Shelling, MD;  [facility];  Service: Cardiovascular;  Laterality: Left;  arm fistula   PERIPHERAL VASCULAR CATHETERIZATION Right 10/31/2014   Procedure: Fistulagram;  Surgeon: Fransisco Hertz, MD;  Location: [facility] INVASIVE CV LAB;  Service: Cardiovascular;  Laterality: Right;   PERIPHERAL VASCULAR CATHETERIZATION Right 10/31/2014   Procedure: Peripheral Vascular Balloon Angioplasty;  Surgeon: Fransisco Hertz, MD;  Location: [facility] INVASIVE CV LAB;  Service: Cardiovascular;  Laterality: Right;  pta venous rt arm   PERIPHERAL VASCULAR CATHETERIZATION N/A 03/13/2015   Procedure: A/V Shuntogram;  Surgeon: Fransisco Hertz, MD;  [facility];  Service: Cardiovascular;  Laterality: N/A;   PERIPHERAL VASCULAR CATHETERIZATION Right 03/13/2015   Procedure: Peripheral Vascular Balloon Angioplasty;  Surgeon: Fransisco Hertz, MD;   Location: [facility] INVASIVE CV LAB;  Service: Cardiovascular;  Laterality: Right;  Shunt   PERIPHERAL VASCULAR CATHETERIZATION Right 01/19/2016   Procedure: A/V Shuntogram;  Surgeon: Chuck Hint, MD;  Location: [facility] INVASIVE CV LAB;  Service: Cardiovascular;  Laterality: Right;   REVISION OF ARTERIOVENOUS GORETEX GRAFT Right 10/27/2015   Procedure: THROMBECTOMY AND REVISION OF RIGHT UPPER ARM  ARTERIOVENOUS GORETEX GRAFT USING X 10CM GORE-TEX GRAFT;  Surgeon: Sherren Kerns, MD;  Location: [facility] OR;  Service: Vascular;  Laterality: Right;   REVISION OF ARTERIOVENOUS GORETEX GRAFT Right 01/30/2016   Procedure: REVISION OF RIGHT UPPER ARM ARTERIOVENOUS USING GORETEX GRAFT;  Surgeon: Chuck Hint, MD;  Location: [facility] OR;  Service: Vascular;  Laterality: Right;   THROMBECTOMY W/ EMBOLECTOMY Left 03/05/2013   Procedure: THROMBECTOMY OF LEFT BRACHIAL ARTERY;  Surgeon: Fransisco Hertz, MD;  Location: [facility] OR;  Service: Vascular;  Laterality: Left;   VENOGRAM Bilateral 03/25/2014   Procedure: VENOGRAM bilateral;  Surgeon: Fransisco Hertz, MD;  Location: [facility] CATH LAB;  Service: Cardiovascular;  Laterality: Bilateral;    OB History   No obstetric history on file.      Home Medications    Prior to Admission medications   Medication Sig Start Date End Date Taking? Authorizing Provider  albuterol (VENTOLIN HFA) 108 (90 Base) MCG/ACT inhaler Inhale 1-2 puffs into the lungs every 6 (six) hours as needed for wheezing or shortness of breath. 01/01/23  Yes Decklyn Hornik, Whitney L, PA  amoxicillin-clavulanate (AUGMENTIN) 875-125 MG tablet Take 1 tablet by mouth every 12 (twelve) hours. 01/01/23  Yes Marvyn Torrez, Whitney L, PA  Aspirin-Caffeine (BAYER BACK & BODY) 500-32.5 MG TABS Take 2 tablets by mouth 2 (two) times daily as needed (pain.).   Yes [provider]  AURYXIA 1 GM 210 MG(Fe) tablet Take 630 mg by mouth See admin instructions. Take 3 tablets (630 mg) by mouth with meals and snacks 01/19/21  Yes [provider]  megestrol (MEGACE) 20 MG tablet Take 1 tablet (20 mg total) by mouth daily. 11/02/22  Yes Lorriane Shire, MD  Methoxy PEG-Epoetin Beta (MIRCERA IJ) On dialysis Days 02/17/22 09/19/23 Yes [provider]  pantoprazole (PROTONIX) 40 MG tablet Take 1 tablet (40 mg total) by mouth daily. 01/12/22  Yes Mansouraty, Netty Starring., MD  predniSONE (STERAPRED UNI-PAK 21 TAB) 10 MG (21) TBPK tablet Take by mouth daily. Take 6 tabs by mouth daily  for 1 days, then 5 tabs for 1 days, then 4 tabs for 1 days, then 3 tabs for 1 days, 2 tabs for 1 days, then 1 tab by mouth daily for 1 days  01/01/23  Yes [place], Whitney L, PA  SENSIPAR 90 MG tablet Take 90 mg by mouth daily. 06/01/22  Yes [provider]    Family History Family History  Problem Relation Age of Onset   Diabetes Mother    Hyperlipidemia Mother    Hypertension Mother    Kidney disease Mother    Anesthesia problems Neg Hx    Hypotension Neg Hx    Malignant hyperthermia Neg Hx    Pseudochol deficiency Neg Hx    Colon cancer Neg Hx    Esophageal cancer Neg Hx    Inflammatory bowel disease Neg Hx    Liver disease Neg Hx    Pancreatic cancer Neg Hx    Rectal cancer Neg Hx    Stomach cancer Neg Hx     Social History Social History   Tobacco Use   Smoking status: Never    Passive exposure: Never   Smokeless tobacco: Never  Vaping Use   Vaping status: Never Used  Substance Use Topics   Alcohol use: No    Alcohol/week: 0.0 standard drinks of alcohol   Drug use: No     Allergies   Pollen extract and Oxycodone-acetaminophen   Review of Systems Review of Systems  Respiratory:  Positive for cough.   As per HPI   Physical Exam Triage Vital Signs ED Triage Vitals  Encounter Vitals Group     BP 01/01/23 1206 (!) 146/96     Systolic BP Percentile --      Diastolic BP Percentile --      Pulse Rate 01/01/23 1206 98     Resp 01/01/23 1206 20     Temp 01/01/23 1206 98.1 F (36.7 C)     Temp src  --      SpO2 01/01/23 1206 97 %     Weight --      Height --      Head Circumference --      Peak Flow --      Pain Score 01/01/23 1204 0     Pain Loc --      Pain Education --      Exclude from Growth Chart --    No data found.  Updated Vital Signs BP (!) 146/96   Pulse 98   Temp 98.1 F (36.7 C)   Resp 20   SpO2 97%   Visual Acuity Right Eye Distance:   Left Eye Distance:   Bilateral Distance:    Right Eye Near:   Left Eye Near:    Bilateral Near:     Physical Exam Vitals and nursing note reviewed.  Constitutional:      General: She is not in acute distress.    Appearance: Normal appearance. She is obese. She is not ill-appearing, toxic-appearing or diaphoretic.  HENT:     Head: Normocephalic and atraumatic.     Salivary Glands: Right salivary gland is not diffusely enlarged or tender. Left salivary gland is not diffusely enlarged or tender.     Right Ear: No drainage, swelling or tenderness. A middle ear effusion is present. Tympanic membrane is not injected, perforated, erythematous or bulging.     Left Ear: No drainage, swelling or tenderness. A middle ear effusion is present. Tympanic membrane is not injected, perforated, erythematous or bulging.     Nose: Congestion and rhinorrhea present.     Right Sinus: Maxillary sinus tenderness present. No frontal sinus tenderness.     Left Sinus: Maxillary sinus tenderness present. No  frontal sinus tenderness.     Mouth/Throat:     Lips: Pink.     Mouth: Mucous membranes are moist.     Pharynx: Oropharynx is clear. Uvula midline. No pharyngeal swelling, oropharyngeal exudate, posterior oropharyngeal erythema, uvula swelling or postnasal drip.  Cardiovascular:     Rate and Rhythm: Normal rate and regular rhythm.  Pulmonary:     Effort: Pulmonary effort is normal.     Breath sounds: Wheezing and rhonchi present.     Comments: All posterior lung sounds with wheezing, more notable on the R Musculoskeletal:     Cervical  back: Normal range of motion and neck supple. No tenderness.  Lymphadenopathy:     Cervical: No cervical adenopathy.  Skin:    General: Skin is warm and dry.     Coloration: Skin is not jaundiced.     Findings: No erythema or rash.  Neurological:     General: No focal deficit present.     Mental Status: She is alert and oriented to person, place, and time.      UC Treatments / Results  Labs (all labs ordered are listed, but only abnormal results are displayed) Labs Reviewed - No data to display  EKG   Radiology No results found.  Procedures Procedures (including critical care time)  Medications Ordered in UC Medications - No data to display  Initial Impression / Assessment and Plan / UC Course  I have reviewed the triage vital signs and the nursing notes.  Pertinent labs & imaging results that were available during my care of the patient were reviewed by me and considered in my medical decision making (see chart for details).     Acute bronchitis - VSS, wheezing noted but pt denies SOB. Will start pt on PO prednisone and albuterol inhaler. Sx >7 days therefore covid and flu testing not indicated Acute sinusitis - PO augmentin BID. Sinus flushes. RTC precautions   Final Clinical Impressions(s) / UC Diagnoses   Final diagnoses:  Acute bronchitis, unspecified organism  Acute non-recurrent sinusitis, unspecified location     Discharge Instructions      Please take the antibiotic, Augmentin, twice daily with food.  Take until completed. Please use a nasal saline or sinus rinse to help flush out your nasal passages.  Please take the prednisone taper pack per package instructions.  Take 1 to 2 puffs of albuterol every 4-6 hours as needed for tightness in the chest, congestion or wheezing.  Return for recheck should you develop worsening swelling in the ankles, worsening cough, fever or any new symptoms     ED Prescriptions     Medication Sig Dispense Auth.  Provider   amoxicillin-clavulanate (AUGMENTIN) 875-125 MG tablet Take 1 tablet by mouth every 12 (twelve) hours. 14 tablet Gimena Buick, Whitney L, PA   predniSONE (STERAPRED UNI-PAK 21 TAB) 10 MG (21) TBPK tablet Take by mouth daily. Take 6 tabs by mouth daily  for 1 days, then 5 tabs for 1 days, then 4 tabs for 1 days, then 3 tabs for 1 days, 2 tabs for 1 days, then 1 tab by mouth daily for 1 days 21 tablet Maelys Kinnick, Whitney L, PA   albuterol (VENTOLIN HFA) 108 (90 Base) MCG/ACT inhaler Inhale 1-2 puffs into the lungs every 6 (six) hours as needed for wheezing or shortness of breath. 8 g Burnell Hurta, Whitney L, [place]      PDMP not reviewed this encounter.   Maretta Bees, [place] 01/01/23 1341

## 2023-01-01 NOTE — Discharge Instructions (Addendum)
Please take the antibiotic, Augmentin, twice daily with food.  Take until completed. Please use a nasal saline or sinus rinse to help flush out your nasal passages.  Please take the prednisone taper pack per package instructions.  Take 1 to 2 puffs of albuterol every 4-6 hours as needed for tightness in the chest, congestion or wheezing.  Return for recheck should you develop worsening swelling in the ankles, worsening cough, fever or any new symptoms

## 2023-01-04 ENCOUNTER — Ambulatory Visit (INDEPENDENT_AMBULATORY_CARE_PROVIDER_SITE_OTHER): Payer: 59 | Admitting: Psychology

## 2023-01-04 ENCOUNTER — Other Ambulatory Visit: Payer: 59 | Admitting: Psychology

## 2023-01-04 DIAGNOSIS — F321 Major depressive disorder, single episode, moderate: Secondary | ICD-10-CM | POA: Diagnosis not present

## 2023-01-27 NOTE — Progress Notes (Signed)
DAP note Client name: [name] Therapist name: Letta Moynahan Darreld Mclean, Theresia Majors Date: 12/23/2022 Time: 60 mins  DATA: Client reports, "I am doing real good. I am excited about going to spend time with my family. I have all the gifts. My favorite part is giving out all the gifts. It brings me so much joy. I am also looking forward to all the food. I love cooking for everyone. It is not as good as my mom, but I am getting there. The family seems to like it." She also reported that she does miss her mom this time of year so much. She also reported that she was going to try and reach out to her husband and see if she can set up a visit with him. It will depend on what the jail is doing at this time. Client reports that her stress level is at a 2 this week and would like to keep it that way with the continued use of interventions.   ASSESSMENT: The client remains in good spirits. She physically and mentally feels so much better. She is very excited about the upcoming holidays. Spending time with her family and all of the animals helps so much with her mental health. She is very close with her family and spending time with them is important to her. With the loss of her mom she knows that life is short so this helps so much when she is missing her mother, being surrounded with other people that she loves is good for her overall health.   PLAN: Client will attend therapy every other week, with a goal of 2 sessions per month. Client will attend dialysis 3 times per week with a goal of 3 times per week to maintain her best overall health. Client will take a moment of mindfulness and utilize box breathing as interventions to help with the decrease in depression. We discussed the importance of taking the time to check in daily with herself to evaluate her needs and then make the time to execute those needs.

## 2023-01-31 ENCOUNTER — Encounter (HOSPITAL_COMMUNITY): Payer: Self-pay

## 2023-02-03 ENCOUNTER — Ambulatory Visit (INDEPENDENT_AMBULATORY_CARE_PROVIDER_SITE_OTHER): Payer: 59 | Admitting: Psychology

## 2023-02-03 DIAGNOSIS — F321 Major depressive disorder, single episode, moderate: Secondary | ICD-10-CM

## 2023-02-14 NOTE — Progress Notes (Signed)
 DAP note Client name: [name] Therapist name: Camie Norris [name] SILK Date: 01/04/2023 Time: 60 mins  DATA: Client reports, I am doing great. Christmas was awesome. Spending time with the family and all the good food. We had so much fun. The puppies really liked going with me. They got spoiled. It was just a really nice time. Client also reported that she is feeling really good physically. She stated that they are keeping a close watch on her dry weight to make sure that she does not end up with to much fluid on her again. Client reports that her depression level is at a 2 this week and would like to keep it that way with the continued use of interventions.   ASSESSMENT: Client was doing well today. She enjoyed spending time with her family for the holidays. She got a lot of joy out of giving out her gifts. This was special because she got such meaningful gifts for her family. Physically she is feeling much better. She feels more confident in the fact that they are monitoring her more closely so that she does not end up back in the emergency room.    PLAN: Client will attend therapy bi-weekly, with a goal of 2 sessions per month. Client will attend dialysis 3 times per week with a goal of 3 times per week to maintain her best physical health. Client will take a moment of mindfulness and utilize box breathing as interventions to help with the decrease in depression. Client will check in daily with herself to evaluate her needs and make the time to execute those needs.

## 2023-02-15 ENCOUNTER — Ambulatory Visit (INDEPENDENT_AMBULATORY_CARE_PROVIDER_SITE_OTHER): Payer: 59 | Admitting: Psychology

## 2023-02-15 DIAGNOSIS — F321 Major depressive disorder, single episode, moderate: Secondary | ICD-10-CM | POA: Diagnosis not present

## 2023-03-03 ENCOUNTER — Other Ambulatory Visit: Payer: 59 | Admitting: Psychology

## 2023-03-15 ENCOUNTER — Ambulatory Visit (INDEPENDENT_AMBULATORY_CARE_PROVIDER_SITE_OTHER): Admitting: Psychology

## 2023-03-15 DIAGNOSIS — F321 Major depressive disorder, single episode, moderate: Secondary | ICD-10-CM

## 2023-03-22 ENCOUNTER — Encounter (HOSPITAL_COMMUNITY): Payer: Self-pay

## 2023-03-29 ENCOUNTER — Other Ambulatory Visit: Admitting: Psychology

## 2023-03-29 NOTE — Progress Notes (Signed)
 DAP note Client name: [name] Therapist name: Letta Moynahan [name] Date: 02/03/2023 Time: 60 mins  DATA: Client reports, "My sickness had finally caught up to me. When I went to the hospital, I thought that I was just needing some breathing treatments but then I just kept feeling worse and worse. That is why I had not been here in a while. I am so glad that I am feeling better. I am staying clear away from all the germs." She reports that she is back to normal routine with dialysis and working. Client reports that her depression level is at a 3 this week and would like to keep it that way with the continued use of interventions.   ASSESSMENT: Client was doing okay today. She had really been through it with her sickness, it was just lingering on and that was hard on her mentally and physically. She still had a cough but she looked and sounded better. She was happy to be in her routine and hopeful that she was going to be able to see some family again since she had been quarantined for so long.   PLAN: Client will attend therapy bi-weekly, with a goal of consistently attending 2 sessions per month. Client will attend dialysis 3 times per week with a goal of 3 times per week to maintain her best physical health. Client will take a moment of mindfulness each day and utilize box breathing as interventions to help with the decrease in depression. Client will check in daily with herself to evaluate her needs and make the time to execute those needs as well.

## 2023-04-04 NOTE — Progress Notes (Addendum)
 Comprehensive Clinical Assessment (CCA) Note 02/15/2023 [name] Bachelor [id number]   Chief Complaint: Moderate depressive disorder Visit Diagnosis: Moderate depressive disorder     CCA Screening, Triage and Referral (STR)   Patient Reported Information How did you hear about us ? Neighbor Referral name: [name] Referral phone number:    Whom do you see for routine medical problems? [name] Practice/Facility Name: Internal Medicine Practice/Facility Phone Number: [phone] Name of Contact: No data recorded Contact Number: No data recorded Contact Fax Number: No data recorded Prescriber Name: No data recorded Prescriber Address (if known): No data recorded   What Is the Reason for Your Visit/Call Today? Therapy How Long Has This Been Causing You Problems? Several months What Do You Feel Would Help You the Most Today? Talk to someone, get feelings out   Have You Recently Been in Any Inpatient Treatment (Hospital/Detox/Crisis Center/28-Day Program)? No Name/Location of Program/Hospital:No data recorded How Long Were You There? No data recorded When Were You Discharged? No data recorded   Have You Ever Received Services From [facility] Before? Yes Who Do You See at [facility]? Hospital, doctors   Have You Recently Had Any Thoughts About Hurting Yourself? No Are You Planning to Commit Suicide/Harm Yourself At This time? No   Have you Recently Had Thoughts About Hurting Someone Sherral? No Explanation: No data recorded   Have You Used Any Alcohol or Drugs in the Past 24 Hours? No How Long Ago Did You Use Drugs or Alcohol?  What Did You Use and How Much? No data recorded   Do You Currently Have a Therapist/Psychiatrist? Yes Name of Therapist/Psychiatrist: Camie Almarie Higinio [name], LCSWA   Have You Been Recently Discharged From Any Office Practice or Programs? No Explanation of Discharge From Practice/Program: No data recorded                CCA  Screening Triage Referral Assessment Type of Contact: In person Is this Initial or Reassessment? Reassessment Date Telepsych consult ordered in [facility]: 02/15/2023 Time Telepsych consult ordered in [facility]:    Patient Reported Information Reviewed?  Patient Left Without Being Seen? No Reason for Not Completing Assessment: N/A   Collateral Involvement: No data recorded   Does Patient Have a Court Appointed Legal Guardian? No Name and Contact of Legal Guardian: N/A If Minor and Not Living with Parent(s), Who has Custody? N/A Is CPS involved or ever been involved? No Is APS involved or ever been involved? No   Patient Determined To Be At Risk for Harm To Self or Others Based on Review of Patient Reported Information or Presenting Complaint? No Method: N/A Availability of Means: No data recorded Intent: No data recorded Notification Required: No data recorded Additional Information for Danger to Others Potential: No data recorded Additional Comments for Danger to Others Potential: No data recorded Are There Guns or Other Weapons in Your Home? No data recorded Types of Guns/Weapons: No data recorded Are These Weapons Safely Secured?                                                        No data recorded Who Could Verify You Are Able To Have These Secured: No data recorded Do You Have any Outstanding Charges, Pending Court Dates, Parole/Probation? No data recorded Contacted To Inform of Risk of Harm To  Self or Others: No data recorded   Location of Assessment: [facility]   Does Patient Present under Involuntary Commitment? No IVC Papers Initial File Date: No   County of Residence: [place]   Patient Currently Receiving the Following Services: No data recorded   Determination of Need: No data recorded   Options For Referral: No data recorded       CCA Biopsychosocial Intake/Chief Complaint:  Moderate depressive disorder Current Symptoms/Problems: lonely, sometimes feels down & depressed    Patient Reported Schizophrenia/Schizoaffective Diagnosis in Past: None   Strengths: Smart, hard-working, reliable, trustworthy, independent Preferences: stability, support, independence Abilities: Works hard, emotional intelligence, adaptable, patient   Type of Services Patient Feels are Needed: weekly therapy   Initial Clinical Notes/Concerns: Client seems well adjusted. Looking forward to a therapeutic relationship, this is really something that the client needs.   Mental Health Symptoms Depression:  Yes  Duration of Depressive symptoms: Months  Mania:  None  Anxiety:   Mild  Psychosis:  None  Duration of Psychotic symptoms: None  Trauma:  Yes  Obsessions:  None  Compulsions:  None  Inattention:  None  Hyperactivity/Impulsivity:  None  Oppositional/Defiant Behaviors:  None  Emotional Irregularity:  Normal range  Other Mood/Personality Symptoms:  None    Mental Status Exam Appearance and self-care  Stature: 5 foot 9 inches  Weight: 313 pounds  Clothing:  clean  Grooming:  well groomed  Cosmetic use:  none  Posture/gait:  normal range  Motor activity:  normal range  Sensorium  Attention:  normal range  Concentration:  normal range  Orientation:  normal range  Recall/memory:  normal range  Affect and Mood  Affect:  Pleasant  Mood:  Pleasant  Relating  Eye contact:  normal range  Facial expression:  normal range  Attitude toward examiner:  Pleasant  Thought and Language  Speech flow: normal range  Thought content:  normal range  Preoccupation:  normal range  Hallucinations:  none  Organization:  normal range  [employer] Knowledge:  well versed   Intelligence:  normal range  Abstraction:  normal range  Judgement:  normal range  Reality Testing:  normal range  Insight:  normal range  Decision Making:  normal range  Social Functioning  Social Maturity:  normal range  Social Judgement:  normal range  Stress  Stressors: dialysis, health,  being lonely  Coping Ability:  copes well, adjust well  Skill Deficits:  n/a  Supports:  family, animals, job, therapy      Religion: Christian   Leisure/Recreation: hanging out with family, relaxing with dogs    Exercise/Diet:      CCA Employment/Education Employment/Work Situation: Education officer, 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 Had a relatively good childhood with normal ups and downs.    Childhood History:    Child/Adolescent Assessment:     CCA Substance Use Alcohol/Drug Use: none           ASAM's:  Six Dimensions of Multidimensional Assessment   Dimension 1:  Acute Intoxication and/or Withdrawal Potential:    Dimension 2:  Biomedical Conditions and Complications:    Dimension 3:  Emotional, Behavioral, or Cognitive Conditions and Complications:     Dimension 4:  Readiness to Change:  Dimension 5:  Relapse, Continued use, or Continued Problem Potential:     Dimension 6:  Recovery/Living Environment:     ASAM Severity Score:    ASAM Recommended Level of Treatment:      Substance use Disorder (SUD)  none   Recommendations for Services/Supports/Treatments:  Continued therapy   DSM5 Diagnoses:        Patient Active Problem List    Diagnosis Date Noted   Acute hypoxic respiratory failure (HCC) 03/24/2022   Acute on chronic combined systolic and diastolic CHF (congestive heart failure) (HCC) 03/24/2022   Acute respiratory failure with hypoxia (HCC) 03/24/2022   Calculus of gallbladder without cholecystitis without obstruction 01/15/2022   Pyrosis 01/15/2022   Colon cancer screening 01/15/2022   Belching 01/15/2022   Left upper quadrant abdominal pain 01/15/2022   ESRD on dialysis (HCC) 10/21/2020   Iron  deficiency  anemia, unspecified 02/17/2018   Gout, unspecified 02/15/2018   Anemia in chronic kidney disease (CKD) 07/31/2015   Essential hypertension 02/27/2014   Menorrhagia 01/27/2013   HYPERLIPIDEMIA-MIXED 04/20/2008   OBESITY-MORBID (>100') 04/20/2008   MITRAL REGURGITATION 04/20/2008   CARDIOMYOPATHY, DILATED 04/20/2008      Patient Centered Plan: Patient is on the following Treatment Plan(s):  moderate depression     Referrals to Alternative Service(s): Referred to Alternative Service(s):   Place:   Date:   Time:    Referred to Alternative Service(s):   Place:   Date:   Time:    Referred to Alternative Service(s):   Place:   Date:   Time:    Referred to Alternative Service(s):   Place:   Date:   Time:        Collaboration of Care: n/a   Patient/Guardian was advised Release of Information must be obtained prior to any record release in order to collaborate their care with an outside provider. Patient/Guardian was advised if they have not already done so to contact the registration department to sign all necessary forms in order for us  to release information regarding their care.    Consent: Patient/Guardian gives verbal consent for treatment and assignment of benefits for services provided during this visit. Patient/Guardian expressed understanding and agreed to proceed.    Abigail Norris T [place], LCSWA

## 2023-04-05 ENCOUNTER — Ambulatory Visit: Admitting: Psychology

## 2023-04-05 ENCOUNTER — Encounter (HOSPITAL_COMMUNITY): Payer: Self-pay

## 2023-04-05 DIAGNOSIS — F321 Major depressive disorder, single episode, moderate: Secondary | ICD-10-CM

## 2023-04-07 ENCOUNTER — Other Ambulatory Visit: Payer: Self-pay | Admitting: Obstetrics

## 2023-04-07 DIAGNOSIS — N6320 Unspecified lump in the left breast, unspecified quadrant: Secondary | ICD-10-CM

## 2023-04-13 ENCOUNTER — Encounter (HOSPITAL_COMMUNITY): Payer: Self-pay

## 2023-04-14 ENCOUNTER — Telehealth: Payer: Self-pay | Admitting: General Practice

## 2023-04-14 DIAGNOSIS — B379 Candidiasis, unspecified: Secondary | ICD-10-CM

## 2023-04-14 MED ORDER — FLUCONAZOLE 150 MG PO TABS: 150.0000 mg | ORAL_TABLET | Freq: Once | ORAL | 0 refills | Status: AC

## 2023-04-14 NOTE — Telephone Encounter (Signed)
 Patient called into office and left message on nurse voicemail line stating she thinks she has a yeast infection. She reports a lot of vaginal itching, no discharge yet. Reports she was recently treated with antibiotics for bronchitis and read online it can cause yeast infections. Called patient back & discussed diflucan sent to pharmacy. Patient verbalized understanding.

## 2023-04-18 ENCOUNTER — Other Ambulatory Visit: Payer: Self-pay | Admitting: Obstetrics

## 2023-04-18 DIAGNOSIS — N6320 Unspecified lump in the left breast, unspecified quadrant: Secondary | ICD-10-CM

## 2023-04-18 DIAGNOSIS — N6452 Nipple discharge: Secondary | ICD-10-CM

## 2023-04-19 ENCOUNTER — Ambulatory Visit: Admitting: Psychology

## 2023-04-19 DIAGNOSIS — F321 Major depressive disorder, single episode, moderate: Secondary | ICD-10-CM | POA: Diagnosis not present

## 2023-04-21 ENCOUNTER — Other Ambulatory Visit

## 2023-04-21 ENCOUNTER — Encounter

## 2023-04-25 ENCOUNTER — Encounter (HOSPITAL_COMMUNITY): Payer: Self-pay

## 2023-05-03 ENCOUNTER — Other Ambulatory Visit: Admitting: Psychology

## 2023-05-05 ENCOUNTER — Encounter (HOSPITAL_COMMUNITY): Payer: Self-pay

## 2023-05-05 ENCOUNTER — Other Ambulatory Visit: Admitting: Psychology

## 2023-05-10 ENCOUNTER — Ambulatory Visit (INDEPENDENT_AMBULATORY_CARE_PROVIDER_SITE_OTHER): Admitting: Psychology

## 2023-05-10 DIAGNOSIS — F321 Major depressive disorder, single episode, moderate: Secondary | ICD-10-CM

## 2023-05-11 ENCOUNTER — Other Ambulatory Visit: Payer: Self-pay | Admitting: Obstetrics

## 2023-05-11 DIAGNOSIS — N6452 Nipple discharge: Secondary | ICD-10-CM

## 2023-05-11 DIAGNOSIS — N6320 Unspecified lump in the left breast, unspecified quadrant: Secondary | ICD-10-CM

## 2023-05-12 NOTE — Progress Notes (Signed)
 DAP note Client name: Rubab Laudano Therapist name: Eligah Grow Starling Eck, Milinda Allen Date: 03/15/2023 Time: 60 mins  DATA: Client reports, "I have had a terrible time trying to get over all this sickness. I had to go back to the doctor about this cough. I am just trying to stay well. It has been an awful winter." She reports that she is back to her normal routine with dialysis and working and that she just moves a little slower at times. Client reports that her depression level is at a 3 this week and would like to keep it that way with the continued use of interventions.   ASSESSMENT: Client was doing okay today. She is finally starting to be on the mend but it has just been a really slow healing process. She is doing all the right things, liking eating better and taking all her medications. Hopefully by next week, she will be much better.    PLAN: Client will attend therapy bi-weekly, with a goal of consistently attending 2 sessions per month. Client will attend dialysis 3 times per week with a goal of 3 times per week to maintain her best physical health. Client will take a moment of mindfulness each day and utilize box breathing as interventions to help with the decrease in depression. Client will check in daily with herself to evaluate her needs and make the time to execute those needs as well.

## 2023-05-17 ENCOUNTER — Ambulatory Visit

## 2023-05-17 ENCOUNTER — Ambulatory Visit
Admission: RE | Admit: 2023-05-17 | Discharge: 2023-05-17 | Disposition: A | Discharge status: Home / Self Care | Source: Ambulatory Visit | Attending: Obstetrics | Admitting: Obstetrics

## 2023-05-17 DIAGNOSIS — N6320 Unspecified lump in the left breast, unspecified quadrant: Secondary | ICD-10-CM

## 2023-05-17 DIAGNOSIS — N6452 Nipple discharge: Secondary | ICD-10-CM

## 2023-05-24 ENCOUNTER — Ambulatory Visit: Admitting: Psychology

## 2023-05-24 DIAGNOSIS — F321 Major depressive disorder, single episode, moderate: Secondary | ICD-10-CM

## 2023-05-31 ENCOUNTER — Encounter: Payer: Self-pay | Admitting: Internal Medicine

## 2023-05-31 NOTE — Progress Notes (Signed)
   DAP note Client name: [name] Therapist name: Eligah Grow Starling Eck, Milinda Allen Date: 04/05/2023 Time: 60 mins  DATA: Client reports, "I am finally feeling better. That was horrible and I can promise you that I am staying away from everyone. I do not want to risk getting sick again." Client reports that her depression level is at a 3 this week and would like to try and get it down to a 2 with continued use of interventions.   ASSESSMENT: Client was doing much better. It was visible that she felt better as evidence by the way that she looked and acted. She did not tire as easy coming up the stairs or have a coughing fit. She was much more animated and happy to catch me up. The sickness was clearly taking a toll on her overall so it was good to see her feeling so much better.  PLAN: Client will attend therapy bi-weekly, with a goal of consistently attending 2 sessions per month. Client will attend dialysis 3 times per week with a goal of 3 times per week to maintain her best physical health. Client will take a moment of mindfulness each day and utilize box breathing as interventions to help with the decrease in depression. Client will check in daily with herself to evaluate her needs and make the time to execute those needs as well.

## 2023-06-14 ENCOUNTER — Ambulatory Visit (INDEPENDENT_AMBULATORY_CARE_PROVIDER_SITE_OTHER): Admitting: Psychology

## 2023-06-14 DIAGNOSIS — F321 Major depressive disorder, single episode, moderate: Secondary | ICD-10-CM | POA: Diagnosis not present

## 2023-06-20 NOTE — Progress Notes (Signed)
 DAP note Client name: [name] Therapist name: Eligah Grow Fredrick Jenkins Date: 04/19/2023 Time: 60 mins  DATA: Client reports that she is back at work and that she finally felt like she could be around her family again. She states she is feeling much stronger and healthier. Client reports that her depression level is at a 3 this week and would like to try and get it down to a 2 with continued use of interventions.   ASSESSMENT: Client appeared much more upbeat. She was not coughing as much and she continues to loose weight. It was apparent that seeing her family boosted her spirits. It really took a toll on her mental health because she felt so physically bad and she could not see her family.    PLAN: Client will attend therapy bi-weekly, with a goal of consistently attending 2 sessions per month. Client will attend dialysis 3 times per week with a goal of 3 times per week to maintain her best physical health. Client will take a moment of mindfulness each day and utilize box breathing as interventions to help with the decrease in depression. Client will check in daily with herself to evaluate her needs and make the time to execute those needs as well.

## 2023-06-22 ENCOUNTER — Encounter (HOSPITAL_COMMUNITY): Payer: Self-pay

## 2023-06-22 ENCOUNTER — Ambulatory Visit (HOSPITAL_COMMUNITY)
Admission: RE | Admit: 2023-06-22 | Discharge: 2023-06-22 | Disposition: A | Discharge status: Home / Self Care | Source: Ambulatory Visit | Attending: Sports Medicine | Admitting: Sports Medicine

## 2023-06-22 ENCOUNTER — Other Ambulatory Visit: Payer: Self-pay

## 2023-06-22 VITALS — BP 152/90 | HR 81 | Temp 98.2°F | Resp 20

## 2023-06-22 DIAGNOSIS — T63301A Toxic effect of unspecified spider venom, accidental (unintentional), initial encounter: Secondary | ICD-10-CM | POA: Diagnosis not present

## 2023-06-22 NOTE — Discharge Instructions (Signed)
 Spider Bite Instructions:  Pain and swelling: If you have discomfort, you may use over-the-counter pain relievers such as acetaminophen  (Tylenol ). Applying a cold compress or ice pack (wrapped in a cloth) for 10-15 minutes at a time can help reduce pain and swelling.  Itching: If the area is itchy, topical hydrocortisone 1% (over the counter) may help. Avoid scratching to prevent irritation or infection.  Activity: You may resume normal activities as tolerated. Elevate your leg when possible to help reduce swelling.  Monitor for infection: Watch for signs of infection, such as increased redness, warmth, swelling, pus, or fever. If any of these develop, seek medical attention promptly.

## 2023-06-22 NOTE — ED Provider Notes (Signed)
 [facility]    CSN: [id number] Arrival date & time: 06/22/23  1114      History   Chief Complaint Chief Complaint  Patient presents with   Leg Pain    Sharp pain in lower leg - Entered by patient   Insect Bite    HPI [name] is a pleasant 48 y.o. female with h/o ESRD on HD here with one week of localized pain and swelling on the posterior right calf. Denies known inciting injury or bite, but does frequently see spiders in her home and think it was a spider bite. Pain is severe but localized. There is no overlaying warmth, drainage, redness or systemic fevers/chills or nausea. At her last HD appointment she spoke with her nephrologist who recommended our evaluation.   Leg Pain   Past Medical History:  Diagnosis Date   Anemia    Anxiety    pt not aware of this   Arthritis    Breast mass 09/01/2016   Targeted ultrasound is performed, showing a mass in the left breast at 11 o'clock, 1 cm from the nipple measuring 6.4 by 5.0 cm. The mass is heterogeneous and may contain macroscopic fat.   IMPRESSION: The mass in the left breast is probably benign, possibly a Hamartoma.  RECOMMENDATION: Recommend six-month follow-up mammogram of the probably benign left breast mass. If necessary, an ultrasound co   CHF (congestive heart failure) (HCC)    hosp. 2011   Dilated cardiomyopathy (HCC)    Encounter for blood transfusion    ESRD on hemodialysis (HCC)    [facility] MWF   Family history of adverse reaction to anesthesia      Aunt has a tracheostomy and kept coughing after colonoscopy    Foot fracture    Gout    no flare up for several yrs   Headache    Heart murmur    as a child, no problems as an adult   History of blood transfusion    x 2   History of pneumonia    Hyperlipidemia    Hypertension    Menorrhagia    Morbid obesity (HCC)    Pneumonia    hosp. 2010   Shortness of breath    HX CHL - occasional. 04/09/21 patient sits down and shortness of breath  susides.   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   Thyroid disease     Patient Active Problem List   Diagnosis Date Noted   Acute on chronic systolic heart failure (HCC) 11/22/2022   Acute hypoxic respiratory failure (HCC) 03/24/2022   Acute on chronic combined systolic and diastolic CHF (congestive heart failure) (HCC) 03/24/2022   Acute respiratory failure with hypoxia (HCC) 03/24/2022   Calculus of gallbladder without cholecystitis without obstruction 01/15/2022   Pyrosis 01/15/2022   Colon cancer screening 01/15/2022   Belching 01/15/2022   Left upper quadrant abdominal pain 01/15/2022   Complication of vascular dialysis catheter 10/21/2020   ESRD on dialysis (HCC) 10/21/2020   Iron  deficiency anemia, unspecified 02/17/2018   Gout, unspecified 02/15/2018   Unspecified lump in the left breast, unspecified quadrant 02/15/2018   Body mass index (BMI) 45.0-49.9, adult (HCC) 02/15/2018   Superficial mycosis, unspecified 02/15/2018   Other seasonal allergic rhinitis 02/15/2018   Secondary hyperparathyroidism of renal origin (HCC) 02/14/2018   Anemia in chronic kidney  disease (CKD) 07/31/2015   Essential hypertension 02/27/2014   Menorrhagia 01/27/2013   Mixed hyperlipidemia 04/20/2008   OBESITY-MORBID (>100') 04/20/2008   MITRAL REGURGITATION 04/20/2008   Dilated cardiomyopathy (HCC) 04/20/2008    Past Surgical History:  Procedure Laterality Date   A/V FISTULAGRAM Left 08/13/2021   Procedure: A/V Fistulagram;  Surgeon: Young Hensen, MD;  Location: [facility] INVASIVE CV LAB;  Service: Cardiovascular;  Laterality: Left;   ANGIOPLASTY  07/28/2015   Procedure: ANGIOPLASTY OF RIGHT BRACHIAL VEIN;  Surgeon: Arvil Lauber, MD;  Location: [facility] OR;  Service: Vascular;;   AV FISTULA PLACEMENT  08/04/2011   Procedure: ARTERIOVENOUS (AV) FISTULA CREATION;   Surgeon: Richrd Char, MD;  Location: [facility] OR;  Service: Vascular;  Laterality: Right;  Creation right brachiocephalic arteriovenous fistula   AV FISTULA PLACEMENT Left 02/05/2013   Procedure: ARTERIOVENOUS FISTULA CREATION LEFT ARM;  Surgeon: Arvil Lauber, MD;  Location: [facility] OR;  Service: Vascular;  Laterality: Left;   AV FISTULA PLACEMENT Right 06/04/2013   Procedure: ARTERIOVENOUS (AV) FISTULA CREATION;  Surgeon: Arvil Lauber, MD;  Location: [facility] OR;  Service: Vascular;  Laterality: Right;   AV FISTULA PLACEMENT Right 04/15/2014   Procedure: INSERTION OF ARTERIOVENOUS (AV) GORE-TEX GRAFT ARM;  Surgeon: Arvil Lauber, MD;  Location: [facility] OR;  Service: Vascular;  Laterality: Right;   AV FISTULA PLACEMENT Left 02/25/2021   Procedure: LEFT ARM ARTERIOVENOUS (AV) FISTULA CREATION;  Surgeon: Young Hensen, MD;  Location: [facility] OR;  Service: Vascular;  Laterality: Left;   AV FISTULA PLACEMENT Left 08/26/2021   Procedure: LEFT ARM LIGATION OF BRACHIOBASILIC FISTULA,  REPAIR PSEUDOANEURYSM;  Surgeon: Young Hensen, MD;  Location: [facility] OR;  Service: Vascular;  Laterality: Left;   AV FISTULA PLACEMENT Left 10/12/2021   Procedure: LEFT RADIOCEPHALIC ARTERIOVENOUS FISTULA  ARTERIOVENOUS (AV) FISTULA CREATION WITH INTERPOSITION GRAFT;  Surgeon: Young Hensen, MD;  Location: [facility] OR;  Service: Vascular;  Laterality: Left;   BASCILIC VEIN TRANSPOSITION Right 08/13/2013   Procedure: Second Stage Brachial Vein Transposition;  Surgeon: Arvil Lauber, MD;  Location: [facility] OR;  Service: Vascular;  Laterality: Right;   BASCILIC VEIN TRANSPOSITION Left 04/13/2021   Procedure: LEFT ARM SECOND STAGE BASILIC VEIN TRANSPOSITION;  Surgeon: Young Hensen, MD;  Location[facility] OR;  Service: Vascular;  Laterality: Left;   CARDIAC SURGERY  1979   repair of hole in heart   GRAFT APPLICATION Left 10/12/2021   Procedure: ARTEGRAFT COLLAGEN VASCULAR GRAFT APPLICATION;  Surgeon: Young Hensen, MD;  Location: [facility] OR;   Service: Vascular;  Laterality: Left;   INCISION AND DRAINAGE OF WOUND Right 07/28/2015   Procedure: IRRIGATION AND DEBRIDEMENT RIGHT HAND  WOUND;  Surgeon: Mauricia South, MD;  Location: [facility] OR;  Service: Plastics;  Laterality: Right;   LIGATION OF ARTERIOVENOUS  FISTULA Left 03/05/2013   Procedure: LIGATION OF ARTERIOVENOUS  FISTULA;  Surgeon: Arvil Lauber, MD;  Location: [facility] OR;  Service: Vascular;  Laterality: Left;   PATCH ANGIOPLASTY Left 03/05/2013   Procedure: PATCH ANGIOPLASTY;  Surgeon: Arvil Lauber, MD;  Location: [facility] OR;  Service: Vascular;  Laterality: Left;   PERIPHERAL VASCULAR BALLOON ANGIOPLASTY Left 08/13/2021   Procedure: PERIPHERAL VASCULAR BALLOON ANGIOPLASTY;  Surgeon: Young Hensen, MD;  [facility] INVAS[facility] CV LAB;  Service: Cardiovascular;  Laterality: Left;  arm fistula   PERIPHERAL VASCULAR CATHETERIZATION Right 10/31/2014   Procedure: Fistulagram;  Surgeon: Arvil Lauber, MD;  Location: [facility] INVASIVE CV LAB;  Service: Cardiovascular;  Laterality:  Right;   PERIPHERAL VASCULAR CATHETERIZATION Right 10/31/2014   Procedure: Peripheral Vascular Balloon Angioplasty;  Surgeon: Arvil Lauber, MD;  Location: [facility] INVASIVE CV LAB;  Service: Cardiovascular;  Laterality: Right;  pta venous rt arm   PERIPHERAL VASCULAR CATHETERIZATION N/A 03/13/2015   Procedure: A/V Shuntogram;  Surgeon: Arvil Lauber, MD;  [facility] INVASIVE CV LAB;  Service: Cardiovascular;  Laterality: N/A;   PERIPHERAL VASCULAR CATHETERIZATION Right 03/13/2015   Procedure: Peripheral Vascular Balloon Angioplasty;  Surgeon: Arvil Lauber, MD;  Location: [facility] INVASIVE CV LAB;  Service: Cardiovascular;  Laterality: Right;  Shunt   PERIPHERAL VASCULAR CATHETERIZATION Right 01/19/2016   Procedure: A/V Shuntogram;  Surgeon: Dannis Dy, MD;  Location: [facility] INVASIVE CV LAB;  Service: Cardiovascular;  Laterality: Right;   REVISION OF ARTERIOVENOUS GORETEX GRAFT Right 10/27/2015   Procedure: THROMBECTOMY AND REVISION OF RIGHT  UPPER ARM  ARTERIOVENOUS GORETEX GRAFT USING X 10CM GORE-TEX GRAFT;  Surgeon: Richrd Char, MD;  Location: [facility] OR;  Service: Vascular;  Laterality: Right;   REVISION OF ARTERIOVENOUS GORETEX GRAFT Right 01/30/2016   Procedure: REVISION OF RIGHT UPPER ARM ARTERIOVENOUS USING GORETEX GRAFT;  Surgeon: Dannis Dy, MD;  Location: [facility] OR;  Service: Vascular;  Laterality: Right;   THROMBECTOMY W/ EMBOLECTOMY Left 03/05/2013   Procedure: THROMBECTOMY OF LEFT BRACHIAL ARTERY;  Surgeon: Arvil Lauber, MD;  Location: [facility] OR;  Service: Vascular;  Laterality: Left;   VENOGRAM Bilateral 03/25/2014   Procedure: VENOGRAM bilateral;  Surgeon: Arvil Lauber, MD;  Location: [facility] CATH LAB;  Service: Cardiovascular;  Laterality: Bilateral;    OB History   No obstetric history on file.      Home Medications    Prior to Admission medications   Medication Sig Start Date End Date Taking? Authorizing Provider  albuterol  (VENTOLIN  HFA) 108 (90 Base) MCG/ACT inhaler Inhale 1-2 puffs into the lungs every 6 (six) hours as needed for wheezing or shortness of breath. 01/01/23  Yes Crain, Whitney L, PA  Aspirin-Caffeine (BAYER BACK & BODY) 500-32.5 MG TABS Take 2 tablets by mouth 2 (two) times daily as needed (pain.).   Yes [provider]  gabapentin (NEURONTIN) 100 MG capsule Take 200 mg by mouth at bedtime.   Yes [provider]  megestrol  (MEGACE ) 20 MG tablet Take 1 tablet (20 mg total) by mouth daily. 11/02/22  Yes Ajewole, Christana, MD  amoxicillin -clavulanate (AUGMENTIN ) 875-125 MG tablet Take 1 tablet by mouth every 12 (twelve) hours. 01/01/23   Crain, Whitney L, PA  AURYXIA  1 GM 210 MG(Fe) tablet Take 630 mg by mouth See admin instructions. Take 3 tablets (630 mg) by mouth with meals and snacks 01/19/21   [provider]  Methoxy PEG-Epoetin  Beta (MIRCERA IJ) On dialysis Days 02/17/22 09/19/23  [provider]  pantoprazole  (PROTONIX ) 40 MG tablet Take 1 tablet (40 mg  total) by mouth daily. 01/12/22   Mansouraty, Albino Alu., MD  predniSONE  (STERAPRED UNI-PAK 21 TAB) 10 MG (21) TBPK tablet Take by mouth daily. Take 6 tabs by mouth daily  for 1 days, then 5 tabs for 1 days, then 4 tabs for 1 days, then 3 tabs for 1 days, 2 tabs for 1 days, then 1 tab by mouth daily for 1 days 01/01/23   Gatha Kaska, Whitney L, PA  SENSIPAR  90 MG tablet Take 90 mg by mouth daily. 06/01/22   [provider]    Family History Family History  Problem Relation Age of Onset   Diabetes  Mother    Hyperlipidemia Mother    Hypertension Mother    Kidney disease Mother    Anesthesia problems Neg Hx    Hypotension Neg Hx    Malignant hyperthermia Neg Hx    Pseudochol deficiency Neg Hx    Colon cancer Neg Hx    Esophageal cancer Neg Hx    Inflammatory bowel disease Neg Hx    Liver disease Neg Hx    Pancreatic cancer Neg Hx    Rectal cancer Neg Hx    Stomach cancer Neg Hx     Social History Social History   Tobacco Use   Smoking status: Never    Passive exposure: Never   Smokeless tobacco: Never  Vaping Use   Vaping status: Never Used  Substance Use Topics   Alcohol use: No    Alcohol/week: 0.0 standard drinks of alcohol   Drug use: No     Allergies   Pollen extract and Oxycodone -acetaminophen    Review of Systems Review of Systems   Physical Exam Triage Vital Signs ED Triage Vitals  Encounter Vitals Group     BP 06/22/23 1137 (!) 152/90     Girls Systolic BP Percentile --      Girls Diastolic BP Percentile --      Boys Systolic BP Percentile --      Boys Diastolic BP Percentile --      Pulse Rate 06/22/23 1137 81     Resp 06/22/23 1137 20     Temp 06/22/23 1137 98.2 F (36.8 C)     Temp src --      SpO2 06/22/23 1137 98 %     Weight --      Height --      Head Circumference --      Peak Flow --      Pain Score 06/22/23 1133 10     Pain Loc --      Pain Education --      Exclude from Growth Chart --    No data found.  Updated Vital  Signs BP (!) 152/90   Pulse 81   Temp 98.2 F (36.8 C)   Resp 20   SpO2 98%   Visual Acuity Right Eye Distance:   Left Eye Distance:   Bilateral Distance:    Right Eye Near:   Left Eye Near:    Bilateral Near:     Physical Exam Constitutional:      Appearance: Normal appearance. She is obese. She is not ill-appearing, toxic-appearing or diaphoretic.   Musculoskeletal:        General: Tenderness present. No swelling, deformity or signs of injury. Normal range of motion.     Right lower leg: No edema.     Left lower leg: No edema.   Skin:    General: Skin is warm.     Comments: 2cm circular area of hyperpigmentation with firm induration deep. There is a small punctate pustule in the middle. No necrosis or erythema. No fluctuance. Moderately TTP. No streaking erythema.   Neurological:     General: No focal deficit present.     Mental Status: She is alert and oriented to person, place, and time.     Sensory: No sensory deficit.     Motor: No weakness.     Coordination: Coordination normal.     Gait: Gait normal.   Psychiatric:        Mood and Affect: Mood normal.  Behavior: Behavior normal.      UC Treatments / Results  Labs (all labs ordered are listed, but only abnormal results are displayed) Labs Reviewed - No data to display  EKG   Radiology No results found.  Procedures Procedures (including critical care time)  Medications Ordered in UC Medications - No data to display  Initial Impression / Assessment and Plan / UC Course  I have reviewed the triage vital signs and the nursing notes.  Pertinent labs & imaging results that were available during my care of the patient were reviewed by me and considered in my medical decision making (see chart for details).    Vitals and triage reviewed, patient is hemodynamically stable.   Spider bite wound, accidental or unintentional, initial encounter Overall, vitals and exam are reassuring. No evidence  of infection or abscess formation.  Supportive care discussed. Return and ER precautions discussed Patient's questions were answered and they are in agreement with this plan  Final Clinical Impressions(s) / UC Diagnoses   Final diagnoses:  Spider bite wound, accidental or unintentional, initial encounter     Discharge Instructions      Spider Bite Instructions:  Pain and swelling: If you have discomfort, you may use over-the-counter pain relievers such as acetaminophen  (Tylenol ). Applying a cold compress or ice pack (wrapped in a cloth) for 10-15 minutes at a time can help reduce pain and swelling.  Itching: If the area is itchy, topical hydrocortisone 1% (over the counter) may help. Avoid scratching to prevent irritation or infection.  Activity: You may resume normal activities as tolerated. Elevate your leg when possible to help reduce swelling.  Monitor for infection: Watch for signs of infection, such as increased redness, warmth, swelling, pus, or fever. If any of these develop, seek medical attention promptly.   ED Prescriptions   None    PDMP not reviewed this encounter.   Marliss Simple, MD 06/22/23 1235

## 2023-06-22 NOTE — ED Triage Notes (Signed)
 Pt reports a possible spider on RT posterior calf. Skin is swollen and red.Pt also reports sharp pain at bite site.  Pt is on dialysis.

## 2023-06-28 ENCOUNTER — Ambulatory Visit (INDEPENDENT_AMBULATORY_CARE_PROVIDER_SITE_OTHER): Admitting: Psychology

## 2023-06-28 DIAGNOSIS — F321 Major depressive disorder, single episode, moderate: Secondary | ICD-10-CM | POA: Diagnosis not present

## 2023-07-10 ENCOUNTER — Emergency Department (HOSPITAL_BASED_OUTPATIENT_CLINIC_OR_DEPARTMENT_OTHER)

## 2023-07-10 ENCOUNTER — Encounter (HOSPITAL_BASED_OUTPATIENT_CLINIC_OR_DEPARTMENT_OTHER): Payer: Self-pay

## 2023-07-10 ENCOUNTER — Other Ambulatory Visit: Payer: Self-pay

## 2023-07-10 ENCOUNTER — Emergency Department (HOSPITAL_BASED_OUTPATIENT_CLINIC_OR_DEPARTMENT_OTHER)
Admission: EM | Admit: 2023-07-10 | Discharge: 2023-07-10 | Disposition: A | Discharge status: Home / Self Care | Attending: Emergency Medicine | Admitting: Emergency Medicine

## 2023-07-10 DIAGNOSIS — N186 End stage renal disease: Secondary | ICD-10-CM | POA: Diagnosis not present

## 2023-07-10 DIAGNOSIS — L03115 Cellulitis of right lower limb: Secondary | ICD-10-CM | POA: Insufficient documentation

## 2023-07-10 DIAGNOSIS — Z7982 Long term (current) use of aspirin: Secondary | ICD-10-CM | POA: Diagnosis not present

## 2023-07-10 DIAGNOSIS — I509 Heart failure, unspecified: Secondary | ICD-10-CM | POA: Diagnosis not present

## 2023-07-10 DIAGNOSIS — I132 Hypertensive heart and chronic kidney disease with heart failure and with stage 5 chronic kidney disease, or end stage renal disease: Secondary | ICD-10-CM | POA: Diagnosis not present

## 2023-07-10 DIAGNOSIS — M79604 Pain in right leg: Secondary | ICD-10-CM | POA: Diagnosis present

## 2023-07-10 DIAGNOSIS — Z992 Dependence on renal dialysis: Secondary | ICD-10-CM | POA: Diagnosis not present

## 2023-07-10 MED ORDER — HYDROCODONE-ACETAMINOPHEN 5-325 MG PO TABS
1.0000 | ORAL_TABLET | Freq: Two times a day (BID) | ORAL | 0 refills | Status: DC | PRN
Start: 2023-07-10 — End: 2023-09-14

## 2023-07-10 MED ORDER — DOXYCYCLINE HYCLATE 100 MG PO CAPS: 100.0000 mg | ORAL_CAPSULE | Freq: Two times a day (BID) | ORAL | 0 refills | Status: DC

## 2023-07-10 MED ORDER — HYDROCODONE-ACETAMINOPHEN 5-325 MG PO TABS: 1.0000 | ORAL_TABLET | Freq: Four times a day (QID) | ORAL | 0 refills | Status: DC | PRN

## 2023-07-10 MED ORDER — DOXYCYCLINE HYCLATE 100 MG PO TABS
100.0000 mg | ORAL_TABLET | Freq: Once | ORAL | Status: AC
  Administered 2023-07-10: 100 mg via ORAL
  Filled 2023-07-10: qty 1

## 2023-07-10 NOTE — ED Provider Notes (Addendum)
 [place] EM[facility]GENCY DEPARTMENT AT [facility] Provider Note   CSN: [id number] Arrival date & time: 07/10/23  2058     Patient presents with: Leg Pain (R)   [name] is a 48 y.o. female.   Patient is a dialysis patient normally on Monday Wednesday Friday.  Was dialyzed on Friday due tomorrow morning for dialysis again.  Patient had a persistent painful area of induration and some erythema on the back of the right calf since June 18.  Patient has not been on antibiotics.  At that time they thought maybe it was a spider bite was seen at urgent care for it.  Patient states he never really got better.  Has not really opened up.  No chest pain no shortness of breath.  Past medical history sniffer hypertension morbid obesity hyperlipidemia dilated cardiomyopathy congestive heart failure end-stage renal disease on hemodialysis.  Patient with AV fistula left upper extremity.       Prior to Admission medications   Medication Sig Start Date End Date Taking? Authorizing Provider  doxycycline  (VIBRAMYCIN ) 100 MG capsule Take 1 capsule (100 mg total) by mouth 2 (two) times daily. 07/10/23  Yes Mazzie Brodrick, MD  albuterol  (VENTOLIN  HFA) 108 [phone] Base) MCG/ACT inhaler Inhale 1-2 puffs into the lungs every 6 (six) hours as needed for wheezing or shortness of breath. 01/01/23   Crain, Whitney L, PA  amoxicillin -clavulanate (AUGMENTIN ) 875-125 MG tablet Take 1 tablet by mouth every 12 (twelve) hours. 01/01/23   Crain, Whitney L, PA  Aspirin-Caffeine (BAYER BACK & BODY) 500-32.5 MG TABS Take 2 tablets by mouth 2 (two) times daily as needed (pain.).    [provider]  AURYXIA  1 GM 210 MG(Fe) tablet Take 630 mg by mouth See admin instructions. Take 3 tablets (630 mg) by mouth with meals and snacks 01/19/21   [provider]  gabapentin (NEURONTIN) 100 MG capsule Take 200 mg by mouth at bedtime.    [provider]  megestrol  (MEGACE ) 20 MG tablet Take 1 tablet  (20 mg total) by mouth daily. 11/02/22   Ajewole, Christana, MD  Methoxy PEG-Epoetin  Beta (MIRCERA IJ) On dialysis Days 02/17/22 09/19/23  [provider]  pantoprazole  (PROTONIX ) 40 MG tablet Take 1 tablet (40 mg total) by mouth daily. 01/12/22   Mansouraty, Aloha Raddle., MD  predniSONE  (STERAPRED UNI-PAK 21 TAB) 10 MG (21) TBPK tablet Take by mouth daily. Take 6 tabs by mouth daily  for 1 days, then 5 tabs for 1 days, then 4 tabs for 1 days, then 3 tabs for 1 days, 2 tabs for 1 days, then 1 tab by mouth daily for 1 days 01/01/23   Lowella, Whitney L, PA  SENSIPAR  90 MG tablet Take 90 mg by mouth daily. 06/01/22   [provider]    Allergies: Pollen extract and Oxycodone -acetaminophen     Review of Systems  Constitutional:  Negative for chills and fever.  HENT:  Negative for ear pain and sore throat.   Eyes:  Negative for pain and visual disturbance.  Respiratory:  Negative for cough and shortness of breath.   Cardiovascular:  Negative for chest pain, palpitations and leg swelling.  Gastrointestinal:  Negative for abdominal pain and vomiting.  Genitourinary:  Negative for dysuria and hematuria.  Musculoskeletal:  Negative for arthralgias and back pain.  Skin:  Negative for color change and rash.  Neurological:  Negative for seizures and syncope.  All other systems reviewed and are negative.   Updated Vital Signs BP ROLLEN)  147/106   Pulse 95   Temp 98.2 F (36.8 C)   Resp 18   SpO2 99%   Physical Exam Vitals and nursing note reviewed.  Constitutional:      General: She is not in acute distress.    Appearance: Normal appearance. She is well-developed.  HENT:     Head: Normocephalic and atraumatic.  Eyes:     Extraocular Movements: Extraocular movements intact.     Conjunctiva/sclera: Conjunctivae normal.     Pupils: Pupils are equal, round, and reactive to light.  Cardiovascular:     Rate and Rhythm: Normal rate and regular rhythm.     Heart sounds: No murmur  heard. Pulmonary:     Effort: Pulmonary effort is normal. No respiratory distress.     Breath sounds: Normal breath sounds.  Abdominal:     Palpations: Abdomen is soft.     Tenderness: There is no abdominal tenderness.  Musculoskeletal:        General: No swelling.     Cervical back: Normal range of motion and neck supple.     Comments: AV fistula left arm.  Patient posterior part of the calf with an area of some induration measuring about 3 cm.  Darkened area in the center not true ulcer no drainage.  No fluctuance.  Maybe a little bit of surrounding erythema out to about 10 cm.  Patient does have some old wounds with some scarring on both legs.  Skin:    General: Skin is warm and dry.     Capillary Refill: Capillary refill takes less than 2 seconds.  Neurological:     General: No focal deficit present.     Mental Status: She is alert and oriented to person, place, and time.  Psychiatric:        Mood and Affect: Mood normal.     (all labs ordered are listed, but only abnormal results are displayed) Labs Reviewed - No data to display  EKG: None  Radiology: No results found.   Procedures   Medications Ordered in the ED - No data to display                                  Medical Decision Making Amount and/or Complexity of Data Reviewed Radiology: ordered.  Risk Prescription drug management.   Seems to have that there could be a bit of infection in the area.  Will get x-ray of the right leg just to rule out any bony involvement but unlikely.  Patient nontoxic no acute distress.  Will probably put her on a course of doxycycline  appropriate for somebody on dialysis.  Clinically do not have any concern for deep vein thrombosis.  Possible that it could be related superficial varicose veins.  Doxycycline  does not require any adjustments for patients on dialysis.  X-ray of the tib-fib without any bony abnormalities.  Generalized soft tissue edema.  Final diagnoses:   Cellulitis of right leg    ED Discharge Orders          Ordered    doxycycline  (VIBRAMYCIN ) 100 MG capsule  2 times daily        07/10/23 2139               Saralyn Willison, MD 07/10/23 7859    Danee Soller, MD 07/10/23 2215

## 2023-07-10 NOTE — ED Triage Notes (Signed)
 Pt c/o bruise on R leg, associated sharp/ stabbing pain seen for saem 6/18 & what they told me to do isn't working. Advises she first noticed bruise 2wks prior, it's got a little swelling to it too.  Denies known blood clot hx

## 2023-07-10 NOTE — Discharge Instructions (Addendum)
 Follow-up with dialysis tomorrow.  Take them doxycycline  as directed.  Make an appoint follow back up with your primary care doctor to have the wound rechecked.  Take the hydrocodone  as needed for pain.

## 2023-07-11 NOTE — Progress Notes (Signed)
 Comprehensive Clinical Assessment (CCA) Note 05/10/2023 [name] [id number]   Chief Complaint: Moderate depressive disorder Visit Diagnosis: Moderate depressive disorder     CCA Screening, Triage and Referral (STR)   Patient Reported Information How did you hear about us ? Neighbor Referral name: Lonia Rattler Referral phone number:    Whom do you see for routine medical problems? [facility] Practice/Facility Name: Internal Medicine Practice/Facility Phone Number: [phone] Name of Contact: No data recorded Contact Number: No data recorded Contact Fax Number: No data recorded Prescriber Name: No data recorded Prescriber Address (if known): No data recorded   What Is the Reason for Your Visit/Call Today? Therapy How Long Has This Been Causing You Problems? Several months What Do You Feel Would Help You the Most Today? Talk to someone, get feelings out   Have You Recently Been in Any Inpatient Treatment (Hospital/Detox/Crisis Center/28-Day Program)? No Name/Location of Program/Hospital:No data recorded How Long Were You There? No data recorded When Were You Discharged? No data recorded   Have You Ever Received Services From [facility] Before? Yes Who Do You See at [facility]? Hospital, doctors   Have You Recently Had Any Thoughts About Hurting Yourself? No Are You Planning to Commit Suicide/Harm Yourself At This time? No   Have you Recently Had Thoughts About Hurting Someone Sherral? No Explanation: No data recorded   Have You Used Any Alcohol or Drugs in the Past 24 Hours? No How Long Ago Did You Use Drugs or Alcohol?  What Did You Use and How Much? No data recorded   Do You Currently Have a Therapist/Psychiatrist? Yes Name of Therapist/Psychiatrist: Camie Almarie [name], LCSWA   Have You Been Recently Discharged From Any Office Practice or Programs? No Explanation of Discharge From Practice/Program: No data recorded                CCA  Screening Triage Referral Assessment Type of Contact: In person Is this Initial or Reassessment? Reassessment Date Telepsych consult ordered in [facility]: 05/10/2023 Time Telepsych consult ordered in [facility]: 12 PM   Patient Reported Information Reviewed? Yes Patient Left Without Being Seen? No Reason for Not Completing Assessment: Completed   Collateral Involvement: No    Does Patient Have a Court Appointed Legal Guardian? No Name and Contact of Legal Guardian: N/A If Minor and Not Living with Parent(s): N/A  Is CPS involved or ever been involved? No Is APS involved or ever been involved? No   Patient Determined To Be At Risk for Harm To Self or Others Based on Review of Patient Reported Information or Presenting Complaint? No Method: N/A Availability of Means: No data recorded Intent: No data recorded Notification Required: No data recorded Additional Information for Danger to Others Potential: No data recorded Additional Comments for Danger to Others Potential: No data recorded Are There Guns or Other Weapons in Your Home? No data recorded Types of Guns/Weapons: No data recorded Are These Weapons Safely Secured?                                                        No data recorded Who Could Verify You Are Able To Have These Secured: No data recorded Do You Have any Outstanding Charges, Pending Court Dates, Parole/Probation? No data recorded Contacted To Inform of Risk of Harm To Self or  Others: No data recorded   Location of Assessment: [facility]   Does Patient Present under Involuntary Commitment? No IVC Papers Initial File Date: No   County of Residence: [place]   Patient Currently Receiving the Following Services: No data recorded   Determination of Need: None at this time   Options For Referral: None at this time       CCA Biopsychosocial Intake/Chief Complaint:  Moderate depressive disorder Current Symptoms/Problems: lonely, sometimes feels down & depressed   Patient  Reported Schizophrenia/Schizoaffective Diagnosis in Past: None   Strengths: Smart, hard-working, reliable, trustworthy, independent Preferences: stability, support, independence Abilities: Works hard, emotional intelligence, adaptable, patient   Type of Services Patient Feels are Needed: weekly therapy   Initial Clinical Notes/Concerns: Client seems well adjusted. Looking forward to a therapeutic relationship, this is really something that the client needs.   Mental Health Symptoms Depression:  Yes  Duration of Depressive symptoms: Months  Mania:  None  Anxiety:   Mild  Psychosis:  None  Duration of Psychotic symptoms: None  Trauma:  Yes  Obsessions:  None  Compulsions:  None  Inattention:  None  Hyperactivity/Impulsivity:  None  Oppositional/Defiant Behaviors:  None  Emotional Irregularity:  Normal range  Other Mood/Personality Symptoms:  None    Mental Status Exam Appearance and self-care  Stature: 5 foot 9 inches  Weight: 313 pounds  Clothing:  clean  Grooming:  well groomed  Cosmetic use:  none  Posture/gait:  normal range  Motor activity:  normal range  Sensorium  Attention:  normal range  Concentration:  normal range  Orientation:  normal range  Recall/memory:  normal range  Affect and Mood  Affect:  Euthymic   Mood:  Pleasant  Relating  Eye contact:  normal range  Facial expression:  normal range  Attitude toward examiner:  Pleasant  Thought and Language  Speech flow: normal range  Thought content:  normal range  Preoccupation:  normal range  Hallucinations:  none  Organization:  normal range  [employer] of Knowledge:  well versed   Intelligence:  normal range  Abstraction:  normal range  Judgement:  normal range  Reality Testing:  normal range  Insight:  normal range  Decision Making:  normal range  Social Functioning  Social Maturity:  normal range  Social Judgement:  normal range  Stress  Stressors: dialysis, health, being  lonely  Coping Ability:  copes well, adjust well, working to control anger  Skill Deficits:  n/a  Supports:  family, animals, job, therapy      Religion: Christian   Leisure/Recreation: hanging out with family, relaxing with dogs    Exercise/Diet: does not eat a lot and continues to loose weight, lack of appetite     CCA Employment/Education Employment/Work Situation: cleaning businesses, 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 Had a relatively good childhood with normal ups and downs.    Childhood History:    Child/Adolescent Assessment:     CCA Substance Use Alcohol/Drug Use: On special occasion, celebrations          ASAM's:  Six Dimensions of Multidimensional Assessment   Dimension 1:  Acute Intoxication and/or Withdrawal Potential:    Dimension 2:  Biomedical Conditions and Complications:    Dimension 3:  Emotional,  Behavioral, or Cognitive Conditions and Complications:     Dimension 4:  Readiness to Change:     Dimension 5:  Relapse, Continued use, or Continued Problem Potential:     Dimension 6:  Recovery/Living Environment:     ASAM Severity Score:    ASAM Recommended Level of Treatment:      Substance use Disorder (SUD)  none   Recommendations for Services/Supports/Treatments:  Continued therapy   DSM5 Diagnoses:        Patient Active Problem List    Diagnosis Date Noted   Acute hypoxic respiratory failure (HCC) 03/24/2022   Acute on chronic combined systolic and diastolic CHF (congestive heart failure) (HCC) 03/24/2022   Acute respiratory failure with hypoxia (HCC) 03/24/2022   Calculus of gallbladder without cholecystitis without obstruction 01/15/2022   Pyrosis 01/15/2022   Colon cancer screening 01/15/2022   Belching  01/15/2022   Left upper quadrant abdominal pain 01/15/2022   ESRD on dialysis (HCC) 10/21/2020   Iron  deficiency anemia, unspecified 02/17/2018   Gout, unspecified 02/15/2018   Anemia in chronic kidney disease (CKD) 07/31/2015   Essential hypertension 02/27/2014   Menorrhagia 01/27/2013   HYPERLIPIDEMIA-MIXED 04/20/2008   OBESITY-MORBID (>100') 04/20/2008   MITRAL REGURGITATION 04/20/2008   CARDIOMYOPATHY, DILATED 04/20/2008      Patient Centered Plan: Patient is on the following Treatment Plan(s):  moderate depression     Referrals to Alternative Service(s): Referred to Alternative Service(s):   Place:   Date:   Time:    Referred to Alternative Service(s):   Place:   Date:   Time:    Referred to Alternative Service(s):   Place:   Date:   Time:    Referred to Alternative Service(s):   Place:   Date:   Time:        Collaboration of Care: n/a   Patient/Guardian was advised Release of Information must be obtained prior to any record release in order to collaborate their care with an outside provider. Patient/Guardian was advised if they have not already done so to contact the registration department to sign all necessary forms in order for us  to release information regarding their care.    Consent: Patient/Guardian gives verbal consent for treatment and assignment of benefits for services provided during this visit. Patient/Guardian expressed understanding and agreed to proceed.    Camie Norris T [place], LCSWA

## 2023-07-14 ENCOUNTER — Ambulatory Visit (INDEPENDENT_AMBULATORY_CARE_PROVIDER_SITE_OTHER): Admitting: Psychology

## 2023-07-14 DIAGNOSIS — F321 Major depressive disorder, single episode, moderate: Secondary | ICD-10-CM | POA: Diagnosis not present

## 2023-07-17 ENCOUNTER — Encounter (HOSPITAL_BASED_OUTPATIENT_CLINIC_OR_DEPARTMENT_OTHER): Payer: Self-pay

## 2023-07-17 ENCOUNTER — Emergency Department (HOSPITAL_BASED_OUTPATIENT_CLINIC_OR_DEPARTMENT_OTHER)

## 2023-07-17 ENCOUNTER — Emergency Department (HOSPITAL_BASED_OUTPATIENT_CLINIC_OR_DEPARTMENT_OTHER): Admission: EM | Admit: 2023-07-17 | Discharge: 2023-07-17 | Disposition: A | Discharge status: Home / Self Care

## 2023-07-17 ENCOUNTER — Other Ambulatory Visit: Payer: Self-pay

## 2023-07-17 DIAGNOSIS — M79604 Pain in right leg: Secondary | ICD-10-CM | POA: Diagnosis present

## 2023-07-17 DIAGNOSIS — L03115 Cellulitis of right lower limb: Secondary | ICD-10-CM | POA: Insufficient documentation

## 2023-07-17 DIAGNOSIS — Z7982 Long term (current) use of aspirin: Secondary | ICD-10-CM | POA: Insufficient documentation

## 2023-07-17 DIAGNOSIS — M79606 Pain in leg, unspecified: Secondary | ICD-10-CM

## 2023-07-17 DIAGNOSIS — L03119 Cellulitis of unspecified part of limb: Secondary | ICD-10-CM

## 2023-07-17 DIAGNOSIS — M7989 Other specified soft tissue disorders: Secondary | ICD-10-CM

## 2023-07-17 MED ORDER — BACITRACIN ZINC 500 UNIT/GM EX OINT: 1.0000 | TOPICAL_OINTMENT | Freq: Two times a day (BID) | CUTANEOUS | 0 refills | Status: DC

## 2023-07-17 MED ORDER — CLINDAMYCIN HCL 150 MG PO CAPS: 300.0000 mg | ORAL_CAPSULE | Freq: Three times a day (TID) | ORAL | 0 refills | Status: AC

## 2023-07-17 MED ORDER — CEPHALEXIN 500 MG PO CAPS: 500.0000 mg | ORAL_CAPSULE | Freq: Two times a day (BID) | ORAL | 0 refills | Status: DC

## 2023-07-17 NOTE — ED Triage Notes (Signed)
 Pt presents c/o R lower leg swelling x1 month. Seen at [facility] and seen here last Sunday, dx cellulitis. Reports awakened this morning with increased swelling and skin opening/abrasion on posterior leg/calf. Has been compliant w/ abx and pain meds at home w/ no relief. Dialysis pt, M/W/F.

## 2023-07-17 NOTE — ED Notes (Signed)
 Pt d/c instructions, medications, and follow-up care reviewed with pt. Pt verbalized understanding and had no further questions at time of d/c. Pt CA&Ox4, ambulatory, and in NAD at time of d/c

## 2023-07-17 NOTE — ED Notes (Signed)
 Pt reports car ride to [place], [place] and back this past weekend. Pain patch on calf last night, pt suspects this may have torn some skin off. Denies fever, chills

## 2023-07-17 NOTE — ED Provider Notes (Signed)
 [place] [facility] DE[facility]MENT AT [facility] Provider Note   CSN: [id number] Arrival date & time: 07/17/23  8161     Patient presents with: Leg Swelling   [name] is a 48 y.o. female.   HPI     Patient states that she presents today because of right leg pain.  Patient states that she was seen for the same complaint about a week ago.  She notes that today the area seemed to have popped and subsequently drained a little bit of red blood.  Denies any ongoing discharge.  Maybe some slight redness surrounding it that she endorses.  No fever no chills.  Endorses some maybe increased swelling of her right lower extremity but she is not really clear.  No chest pain or shortness of breath.  No pleuritic chest pain.  No hemoptysis.  Denies any history of DVT or PE.  Patient states that she has two doses left of doxycycline .  Previous medical history reviewed : Patient was last seen in the ED on July 6.  Was seen because of right leg pain.  X-rays unremarkable.  Patient was started on Doxy.   Prior to Admission medications   Medication Sig Start Date End Date Taking? Authorizing Provider  bacitracin  ointment Apply 1 Application topically 2 (two) times daily. 07/17/23  Yes Simon Lavonia SAILOR, MD  clindamycin  (CLEOCIN ) 150 MG capsule Take 2 capsules (300 mg total) by mouth every 8 (eight) hours for 5 days. 07/17/23 07/22/23 Yes Simon Lavonia SAILOR, MD  albuterol  (VENTOLIN  HFA) 108 (90 Base) MCG/ACT inhaler Inhale 1-2 puffs into the lungs every 6 (six) hours as needed for wheezing or shortness of breath. 01/01/23   Crain, Whitney L, PA  amoxicillin -clavulanate (AUGMENTIN ) 875-125 MG tablet Take 1 tablet by mouth every 12 (twelve) hours. 01/01/23   Crain, Whitney L, PA  Aspirin-Caffeine (BAYER BACK & BODY) 500-32.5 MG TABS Take 2 tablets by mouth 2 (two) times daily as needed (pain.).    [provider]  AURYXIA  1 GM 210 MG(Fe) tablet Take 630 mg by mouth See admin instructions. Take  3 tablets (630 mg) by mouth with meals and snacks 01/19/21   [provider]  doxycycline  (VIBRAMYCIN ) 100 MG capsule Take 1 capsule (100 mg total) by mouth 2 (two) times daily. 07/10/23   Zackowski, Scott, MD  gabapentin (NEURONTIN) 100 MG capsule Take 200 mg by mouth at bedtime.    [provider]  HYDROcodone -acetaminophen  (NORCO/VICODIN) 5-325 MG tablet Take 1 tablet by mouth every 12 (twelve) hours as needed for moderate pain (pain score 4-6). 07/10/23   Zackowski, Scott, MD  megestrol  (MEGACE ) 20 MG tablet Take 1 tablet (20 mg total) by mouth daily. 11/02/22   Ajewole, Christana, MD  Methoxy PEG-Epoetin  Beta (MIRCERA IJ) On dialysis Days 02/17/22 09/19/23  [provider]  pantoprazole  (PROTONIX ) 40 MG tablet Take 1 tablet (40 mg total) by mouth daily. 01/12/22   Mansouraty, Aloha Raddle., MD  predniSONE  (STERAPRED UNI-PAK 21 TAB) 10 MG (21) TBPK tablet Take by mouth daily. Take 6 tabs by mouth daily  for 1 days, then 5 tabs for 1 days, then 4 tabs for 1 days, then 3 tabs for 1 days, 2 tabs for 1 days, then 1 tab by mouth daily for 1 days 01/01/23   Lowella, Whitney L, PA  SENSIPAR  90 MG tablet Take 90 mg by mouth daily. 06/01/22   [provider]    Allergies: Pollen extract and Oxycodone -acetaminophen     Review of  Systems  Constitutional:  Negative for chills and fever.  HENT:  Negative for ear pain and sore throat.   Eyes:  Negative for pain and visual disturbance.  Respiratory:  Negative for cough and shortness of breath.   Cardiovascular:  Negative for chest pain and palpitations.  Gastrointestinal:  Negative for abdominal pain and vomiting.  Genitourinary:  Negative for dysuria and hematuria.  Musculoskeletal:  Negative for arthralgias and back pain.  Skin:  Negative for color change and rash.  Neurological:  Negative for seizures and syncope.  All other systems reviewed and are negative.   Updated Vital Signs BP 122/74   Pulse 77   Temp 98.5 F (36.9  C) (Oral)   Resp 18   Ht 5' 9.5 (1.765 m)   Wt 111.1 kg   LMP  (LMP Unknown)   SpO2 99%   BMI 35.66 kg/m   Physical Exam Vitals and nursing note reviewed.  Constitutional:      General: She is not in acute distress.    Appearance: She is well-developed.  HENT:     Head: Normocephalic and atraumatic.  Eyes:     Conjunctiva/sclera: Conjunctivae normal.  Cardiovascular:     Rate and Rhythm: Normal rate and regular rhythm.     Heart sounds: No murmur heard. Pulmonary:     Effort: Pulmonary effort is normal. No respiratory distress.     Breath sounds: Normal breath sounds.  Abdominal:     Palpations: Abdomen is soft.     Tenderness: There is no abdominal tenderness.  Musculoskeletal:        General: No swelling.     Cervical back: Neck supple.       Legs:  Skin:    General: Skin is warm and dry.     Capillary Refill: Capillary refill takes less than 2 seconds.  Neurological:     Mental Status: She is alert.  Psychiatric:        Mood and Affect: Mood normal.     (all labs ordered are listed, but only abnormal results are displayed) Labs Reviewed - No data to display  EKG: None  Radiology: US  Venous Img Lower Unilateral Right Result Date: 07/17/2023 CLINICAL DATA:  Recent right leg cellulitis. Open wound on right posterior calf and swelling. EXAM: Right LOWER EXTREMITY VENOUS DOPPLER ULTRASOUND TECHNIQUE: Gray-scale sonography with compression, as well as color and duplex ultrasound, were performed to evaluate the deep venous system(s) from the level of the common femoral vein through the popliteal and proximal calf veins. COMPARISON:  None Available. FINDINGS: VENOUS Normal compressibility of the common femoral, superficial femoral, and popliteal veins. The calf veins are poorly visualized due to body habitus and arterial calcification shadowing. Visualized portions of profunda femoral vein and great saphenous vein unremarkable. No filling defects to suggest DVT on  grayscale or color Doppler imaging. Doppler waveforms show normal direction of venous flow, normal respiratory plasticity and response to augmentation. Limited views of the contralateral common femoral vein are unremarkable. OTHER None. Limitations: none IMPRESSION: No evidence of DVT in the right lower extremity. Electronically Signed   By: Norman Gatlin M.D.   On: 07/17/2023 20:57     Procedures   Medications Ordered in the ED - No data to display                                  Medical Decision Making Risk OTC drugs. Prescription drug  management.   Patient states that she presents today because of right leg pain.  Patient states that she was seen for the same complaint about a week ago.  She notes that today the area seemed to have popped and subsequently drained a little bit of red blood.  Denies any ongoing discharge.  Maybe some slight redness surrounding it that she endorses.  No fever no chills.  Endorses some maybe increased swelling of her right lower extremity but she is not really clear.  No chest pain or shortness of breath.  No pleuritic chest pain.  No hemoptysis.  Denies any history of DVT or PE.  Patient states that she has two doses left of doxycycline .  Previous medical history reviewed : Patient was last seen in the ED on July 6.  Was seen because of right leg pain.  X-rays unremarkable.  Patient was started on Doxy.  Upon examination, patient no acute distress.  Hemodynamically stable.  ANO x 3 GCS 15.  Afebrile.   Right leg exam shows 2+ dorsal pedal and posterior tibial pulses.  No concern for any Ischemic pathology at this point time.  No abscess.  No purulence  Did obtain DVT study given that she felt like a slightly increase in swelling compared to her other leg.  DVT study unremarkable.   Patient has been on doxycycline .  Given some erythema surrounding the area, will order clindamycin  to cover for strep species.  Would prefer Keflex  but becomes difficult  given dose adjustment in the setting of her dialysis so we will start clindamycin  instead.  Also called in bacitracin  for the patient to place over the area.   No indication for labs.  Afebrile.  Normotensive.  This would not change management.   Return precautions     Final diagnoses:  Cellulitis of lower extremity, unspecified laterality  Pain of lower extremity, unspecified laterality  Leg swelling    ED Discharge Orders          Ordered    cephALEXin  (KEFLEX ) 500 MG capsule  2 times daily,   Status:  Discontinued        07/17/23 2144    clindamycin  (CLEOCIN ) 150 MG capsule  Every 8 hours        07/17/23 2156    bacitracin  ointment  2 times daily        07/17/23 2156               Simon Lavonia SAILOR, MD 07/17/23 2159

## 2023-07-17 NOTE — Discharge Instructions (Addendum)
 Thankfully, your DVT ultrasound did not show any evidence of blood clot.   I did call in clindamycin  for you to take.  This is a different medication that you have been taken.  Please take this as prescribed. Please pick up the bacitracin  as well.   You can use Tylenol  for pain.  Keep area clean.  Please wash area multiple times per day.

## 2023-07-25 NOTE — Progress Notes (Signed)
 DAP note Client name: [name] Therapist name: Camie Norris [name] SILK Date: 05/24/2023 Time: 60 mins  DATA: Client reports that things have been going well. She states that physically she is really trying and that she has been doing all the things that have been needed to keep her healthy. She reports the biggest issue is that she has no appetite. She states that because of this she is really trying to prioritize snacking and really trying to keep some nutrients in her.  Client reports that her depression level is at a 3 this week and would like to try and get it down to a 2 with continued use of interventions.   ASSESSMENT: Client appeared to be doing much better. Getting around more and spending time with her family always helps the client. She is used to being on the go. She is doing her best to take care of herself physically. She is dropping weight and the doctors are keeping an eye on it, hopefully the snacking will help with her weight loss and nutrition in general.    PLAN: Client will attend therapy bi-weekly, with a goal of consistently attending 2 sessions per month. Client will attend dialysis 3 times per week with a goal of 3 times per week to maintain her best physical health. Client will take a moment of mindfulness each day and utilize box breathing as interventions to help with the decrease in depression. Client will check in daily with herself to evaluate her daily needs and try to maintain those needs for her overall best health.

## 2023-07-28 ENCOUNTER — Other Ambulatory Visit: Admitting: Psychology

## 2023-08-02 ENCOUNTER — Other Ambulatory Visit: Admitting: Psychology

## 2023-08-02 NOTE — Progress Notes (Signed)
 DAP note Client name: Bergen Melle Therapist name: Camie Norris [name], [name] Date: 06/14/2023 Time: 60 mins  DATA: Client reports that things have been going well. She states that she has been visiting family a lot. She states that it makes her happy to spend time with them. She reports that she is really missing her mother and that helps.  Client reports that her depression level is at a 4 this week and would like to try and get it down to a 2 with continued use of interventions.   ASSESSMENT: Client appeared to be okay. She had a sad tone in her voice when talking about her mom but changed to positive when talking about her family. She is very close to one of her aunts and she goes to see her often. When she does that she can also go visit her mother's grave. This gives her and emotional sense of peace.   PLAN: Client will attend therapy bi-weekly, with a goal of consistently attending 2 sessions per month. Client will attend dialysis 3 times per week with a goal of 3 times per week to maintain her best physical health. Client will take a moment of mindfulness each day and utilize box breathing as interventions to help with the decrease in depression. Client will check in daily with herself to evaluate her daily needs and try to maintain those needs for her overall best health.

## 2023-08-04 ENCOUNTER — Ambulatory Visit (INDEPENDENT_AMBULATORY_CARE_PROVIDER_SITE_OTHER): Admitting: Psychology

## 2023-08-04 DIAGNOSIS — F321 Major depressive disorder, single episode, moderate: Secondary | ICD-10-CM

## 2023-08-16 ENCOUNTER — Ambulatory Visit: Admitting: Cardiology

## 2023-08-18 ENCOUNTER — Other Ambulatory Visit: Admitting: Psychology

## 2023-08-22 NOTE — Progress Notes (Signed)
 DAP note Client name: [name] Therapist name: Camie Norris [name] SILK Date: 06/28/2023 Time: 60 mins  DATA: Client reports that she is good. She states that she got a new job making more money. She also states that she got offered a second job that is late at night and she would like to try it and she how it goes because the extra money would be nice. Client reports that her depression level is at a 3 this week and would like to try and get it down to a 2 with continued use of interventions.   ASSESSMENT: Client presented upbeat and positive. She was excited about the new jobs. It will give her a little more financial security which takes a little pressure off of her each month.    PLAN: Client will attend therapy bi-weekly, with a goal of consistently attending 2 sessions per month. Client will attend dialysis 3 times per week with a goal of 3 times per week to maintain her best physical health. Client will take a moment of mindfulness each day and utilize box breathing as interventions to help with the decrease in depression. Client will check in daily with herself to evaluate her daily needs and try to maintain those needs for her overall best health.

## 2023-08-25 ENCOUNTER — Other Ambulatory Visit: Admitting: Psychology

## 2023-09-01 ENCOUNTER — Ambulatory Visit (INDEPENDENT_AMBULATORY_CARE_PROVIDER_SITE_OTHER): Admitting: Psychology

## 2023-09-01 DIAGNOSIS — F321 Major depressive disorder, single episode, moderate: Secondary | ICD-10-CM | POA: Diagnosis not present

## 2023-09-06 NOTE — Progress Notes (Signed)
 DAP note Client name: [name] Therapist name: Camie Norris [name] SILK Date: 07/14/2023 Time: 60 mins  DATA: Client reports that she is okay. She states that she thinks that she got bit by a spider and that it caused her leg to swell and peel. She reports that it hurts so bad that she had to go to the hospital where they told her that she had cellulitis. She states that they put her on antibiotics and she is hoping for some relief at this point. She states that she has no choice but to work because she needs the money but the pain is unbearable at times. Client reports that her depression level is at a 5 this week and would like to try and get it down to a 2 with continued use of interventions.   ASSESSMENT: Client was visibly in pain today. She was doing her best to push past it but this bite has caused her a lot of pain and no one really seems to know what to do. This treatment will hopefully  help the client to work through the pain. We discussed that she needs to make sure that she is taking care of herself because if she does not it could get worse.    PLAN: Client will attend therapy bi-weekly, with a goal of consistently attending 2 sessions per month. Client will attend dialysis 3 times per week with a goal of 3 times per week to maintain her best physical health. Client will take a moment of mindfulness each day and utilize box breathing as interventions to help with the decrease in depression. Client will check in daily with herself to evaluate her daily needs and try to maintain those needs for her overall best health.

## 2023-09-13 ENCOUNTER — Emergency Department (HOSPITAL_COMMUNITY)

## 2023-09-13 ENCOUNTER — Encounter (HOSPITAL_COMMUNITY): Payer: Self-pay

## 2023-09-13 ENCOUNTER — Other Ambulatory Visit: Payer: Self-pay

## 2023-09-13 ENCOUNTER — Inpatient Hospital Stay (HOSPITAL_COMMUNITY)
Admission: EM | Admit: 2023-09-13 | Discharge: 2023-09-16 | DRG: 913 | Disposition: A | Discharge status: Home / Self Care | Attending: Surgery | Admitting: Surgery

## 2023-09-13 DIAGNOSIS — Z79899 Other long term (current) drug therapy: Secondary | ICD-10-CM

## 2023-09-13 DIAGNOSIS — D62 Acute posthemorrhagic anemia: Secondary | ICD-10-CM | POA: Diagnosis present

## 2023-09-13 DIAGNOSIS — N186 End stage renal disease: Secondary | ICD-10-CM | POA: Diagnosis present

## 2023-09-13 DIAGNOSIS — Z885 Allergy status to narcotic agent status: Secondary | ICD-10-CM | POA: Diagnosis not present

## 2023-09-13 DIAGNOSIS — Y92410 Unspecified street and highway as the place of occurrence of the external cause: Secondary | ICD-10-CM

## 2023-09-13 DIAGNOSIS — S36899A Unspecified injury of other intra-abdominal organs, initial encounter: Secondary | ICD-10-CM | POA: Diagnosis present

## 2023-09-13 DIAGNOSIS — T1490XA Injury, unspecified, initial encounter: Secondary | ICD-10-CM | POA: Diagnosis present

## 2023-09-13 DIAGNOSIS — K661 Hemoperitoneum: Secondary | ICD-10-CM | POA: Diagnosis present

## 2023-09-13 DIAGNOSIS — Z833 Family history of diabetes mellitus: Secondary | ICD-10-CM | POA: Diagnosis not present

## 2023-09-13 DIAGNOSIS — Z91048 Other nonmedicinal substance allergy status: Secondary | ICD-10-CM

## 2023-09-13 DIAGNOSIS — D631 Anemia in chronic kidney disease: Secondary | ICD-10-CM | POA: Diagnosis present

## 2023-09-13 DIAGNOSIS — I5022 Chronic systolic (congestive) heart failure: Secondary | ICD-10-CM | POA: Diagnosis present

## 2023-09-13 DIAGNOSIS — I132 Hypertensive heart and chronic kidney disease with heart failure and with stage 5 chronic kidney disease, or end stage renal disease: Secondary | ICD-10-CM | POA: Diagnosis present

## 2023-09-13 DIAGNOSIS — Z992 Dependence on renal dialysis: Secondary | ICD-10-CM

## 2023-09-13 DIAGNOSIS — Z8249 Family history of ischemic heart disease and other diseases of the circulatory system: Secondary | ICD-10-CM

## 2023-09-13 DIAGNOSIS — I420 Dilated cardiomyopathy: Secondary | ICD-10-CM | POA: Diagnosis present

## 2023-09-13 DIAGNOSIS — Z6841 Body Mass Index (BMI) 40.0 and over, adult: Secondary | ICD-10-CM | POA: Diagnosis not present

## 2023-09-13 DIAGNOSIS — Z841 Family history of disorders of kidney and ureter: Secondary | ICD-10-CM | POA: Diagnosis not present

## 2023-09-13 DIAGNOSIS — I959 Hypotension, unspecified: Secondary | ICD-10-CM | POA: Diagnosis present

## 2023-09-13 DIAGNOSIS — Z83438 Family history of other disorder of lipoprotein metabolism and other lipidemia: Secondary | ICD-10-CM

## 2023-09-13 DIAGNOSIS — I472 Ventricular tachycardia, unspecified: Secondary | ICD-10-CM | POA: Diagnosis present

## 2023-09-13 DIAGNOSIS — L942 Calcinosis cutis: Secondary | ICD-10-CM | POA: Diagnosis present

## 2023-09-13 DIAGNOSIS — E785 Hyperlipidemia, unspecified: Secondary | ICD-10-CM | POA: Diagnosis present

## 2023-09-13 DIAGNOSIS — N2581 Secondary hyperparathyroidism of renal origin: Secondary | ICD-10-CM | POA: Diagnosis present

## 2023-09-13 LAB — COMPREHENSIVE METABOLIC PANEL WITH GFR
ALT: 19 U/L (ref 0–44)
AST: 32 U/L (ref 15–41)
Albumin: 2.6 g/dL — ABNORMAL LOW (ref 3.5–5.0)
Alkaline Phosphatase: 40 U/L (ref 38–126)
Anion gap: 14 (ref 5–15)
BUN: 85 mg/dL — ABNORMAL HIGH (ref 6–20)
CO2: 25 mmol/L (ref 22–32)
Calcium: 9.3 mg/dL (ref 8.9–10.3)
Chloride: 94 mmol/L — ABNORMAL LOW (ref 98–111)
Creatinine, Ser: 11.36 mg/dL — ABNORMAL HIGH (ref 0.44–1.00)
GFR, Estimated: 4 mL/min — ABNORMAL LOW (ref >60)
Glucose, Bld: 112 mg/dL — ABNORMAL HIGH (ref 70–99)
Potassium: 4.5 mmol/L (ref 3.5–5.1)
Sodium: 133 mmol/L — ABNORMAL LOW (ref 135–145)
Total Bilirubin: 0.8 mg/dL (ref 0.0–1.2)
Total Protein: 6.3 g/dL — ABNORMAL LOW (ref 6.5–8.1)

## 2023-09-13 LAB — I-STAT CHEM 8, ED
BUN: 86 mg/dL — ABNORMAL HIGH (ref 6–20)
Calcium, Ion: 1.12 mmol/L — ABNORMAL LOW (ref 1.15–1.40)
Chloride: 94 mmol/L — ABNORMAL LOW (ref 98–111)
Creatinine, Ser: 11.7 mg/dL — ABNORMAL HIGH (ref 0.44–1.00)
Glucose, Bld: 108 mg/dL — ABNORMAL HIGH (ref 70–99)
HCT: 18 % — ABNORMAL LOW (ref 36.0–46.0)
Hemoglobin: 6.1 g/dL — CL (ref 12.0–15.0)
Potassium: 4.4 mmol/L (ref 3.5–5.1)
Sodium: 133 mmol/L — ABNORMAL LOW (ref 135–145)
TCO2: 26 mmol/L (ref 22–32)

## 2023-09-13 LAB — CBC WITH DIFFERENTIAL/PLATELET
Abs Immature Granulocytes: 0.1 K/uL — ABNORMAL HIGH (ref 0.00–0.07)
Abs Immature Granulocytes: 0.09 K/uL — ABNORMAL HIGH (ref 0.00–0.07)
Basophils Absolute: 0.1 K/uL (ref 0.0–0.1)
Basophils Absolute: 0.1 K/uL (ref 0.0–0.1)
Basophils Relative: 1 %
Basophils Relative: 1 %
Eosinophils Absolute: 0.2 K/uL (ref 0.0–0.5)
Eosinophils Absolute: 0.1 K/uL (ref 0.0–0.5)
Eosinophils Relative: 1 %
Eosinophils Relative: 1 %
HCT: 16.5 % — ABNORMAL LOW (ref 36.0–46.0)
HCT: 22.4 % — ABNORMAL LOW (ref 36.0–46.0)
Hemoglobin: 5.1 g/dL — CL (ref 12.0–15.0)
Hemoglobin: 7.2 g/dL — ABNORMAL LOW (ref 12.0–15.0)
Immature Granulocytes: 1 %
Immature Granulocytes: 1 %
Lymphocytes Relative: 6 %
Lymphocytes Relative: 6 %
Lymphs Abs: 0.8 K/uL (ref 0.7–4.0)
Lymphs Abs: 0.8 K/uL (ref 0.7–4.0)
MCH: 32.5 pg (ref 26.0–34.0)
MCH: 32 pg (ref 26.0–34.0)
MCHC: 30.9 g/dL (ref 30.0–36.0)
MCHC: 32.1 g/dL (ref 30.0–36.0)
MCV: 105.1 fL — ABNORMAL HIGH (ref 80.0–100.0)
MCV: 99.6 fL (ref 80.0–100.0)
Monocytes Absolute: 0.9 K/uL (ref 0.1–1.0)
Monocytes Absolute: 0.9 K/uL (ref 0.1–1.0)
Monocytes Relative: 7 %
Monocytes Relative: 7 %
Neutro Abs: 11.5 K/uL — ABNORMAL HIGH (ref 1.7–7.7)
Neutro Abs: 12.3 K/uL — ABNORMAL HIGH (ref 1.7–7.7)
Neutrophils Relative %: 84 %
Neutrophils Relative %: 84 %
Platelets: 284 K/uL (ref 150–400)
Platelets: 256 K/uL (ref 150–400)
RBC: 1.57 MIL/uL — ABNORMAL LOW (ref 3.87–5.11)
RBC: 2.25 MIL/uL — ABNORMAL LOW (ref 3.87–5.11)
RDW: 18.5 % — ABNORMAL HIGH (ref 11.5–15.5)
RDW: 19.9 % — ABNORMAL HIGH (ref 11.5–15.5)
WBC: 13.6 K/uL — ABNORMAL HIGH (ref 4.0–10.5)
WBC: 14.3 K/uL — ABNORMAL HIGH (ref 4.0–10.5)
nRBC: 0 % (ref 0.0–0.2)
nRBC: 0 % (ref 0.0–0.2)

## 2023-09-13 LAB — PREPARE RBC (CROSSMATCH)

## 2023-09-13 LAB — MRSA NEXT GEN BY PCR, NASAL: MRSA by PCR Next Gen: NOT DETECTED

## 2023-09-13 LAB — PROTIME-INR
INR: 1.3 — ABNORMAL HIGH (ref 0.8–1.2)
Prothrombin Time: 17.3 s — ABNORMAL HIGH (ref 11.4–15.2)

## 2023-09-13 LAB — HIV ANTIBODY (ROUTINE TESTING W REFLEX): HIV Screen 4th Generation wRfx: NONREACTIVE

## 2023-09-13 MED ORDER — HYDROMORPHONE HCL 1 MG/ML IJ SOLN
1.0000 mg | INTRAMUSCULAR | Status: DC | PRN
  Administered 2023-09-13 – 2023-09-14 (×3): 1 mg via INTRAVENOUS
  Filled 2023-09-13 (×3): qty 1

## 2023-09-13 MED ORDER — ACETAMINOPHEN 500 MG PO TABS
1000.0000 mg | ORAL_TABLET | Freq: Four times a day (QID) | ORAL | Status: DC
  Administered 2023-09-13 – 2023-09-16 (×10): 1000 mg via ORAL
  Filled 2023-09-13 (×11): qty 2

## 2023-09-13 MED ORDER — ONDANSETRON 4 MG PO TBDP: 4.0000 mg | ORAL_TABLET | Freq: Four times a day (QID) | ORAL | Status: DC | PRN

## 2023-09-13 MED ORDER — DOCUSATE SODIUM 100 MG PO CAPS
100.0000 mg | ORAL_CAPSULE | Freq: Two times a day (BID) | ORAL | Status: DC
  Administered 2023-09-14 – 2023-09-15 (×3): 100 mg via ORAL
  Filled 2023-09-13 (×3): qty 1

## 2023-09-13 MED ORDER — METOPROLOL TARTRATE 5 MG/5ML IV SOLN: 5.0000 mg | Freq: Four times a day (QID) | INTRAVENOUS | Status: DC | PRN

## 2023-09-13 MED ORDER — SODIUM CHLORIDE 0.9% IV SOLUTION
Freq: Once | INTRAVENOUS | Status: DC
Start: 2023-09-13 — End: 2023-09-15

## 2023-09-13 MED ORDER — ONDANSETRON HCL 4 MG/2ML IJ SOLN: 4.0000 mg | Freq: Four times a day (QID) | INTRAMUSCULAR | Status: DC | PRN

## 2023-09-13 MED ORDER — IOHEXOL 350 MG/ML SOLN
75.0000 mL | Freq: Once | INTRAVENOUS | Status: AC | PRN
  Administered 2023-09-13: 75 mL via INTRAVENOUS

## 2023-09-13 MED ORDER — PANTOPRAZOLE SODIUM 40 MG PO TBEC
40.0000 mg | DELAYED_RELEASE_TABLET | Freq: Every day | ORAL | Status: DC
Start: 2023-09-13 — End: 2023-09-14
  Administered 2023-09-13: 40 mg via ORAL
  Filled 2023-09-13: qty 1

## 2023-09-13 MED ORDER — ORAL CARE MOUTH RINSE: 15.0000 mL | OROMUCOSAL | Status: DC | PRN

## 2023-09-13 MED ORDER — METHOCARBAMOL 500 MG PO TABS
500.0000 mg | ORAL_TABLET | Freq: Three times a day (TID) | ORAL | Status: DC
  Administered 2023-09-13 – 2023-09-14 (×2): 500 mg via ORAL
  Filled 2023-09-13 (×2): qty 1

## 2023-09-13 MED ORDER — POLYETHYLENE GLYCOL 3350 17 G PO PACK: 17.0000 g | PACK | Freq: Every day | ORAL | Status: DC | PRN

## 2023-09-13 MED ORDER — METHOCARBAMOL 1000 MG/10ML IJ SOLN
500.0000 mg | Freq: Three times a day (TID) | INTRAMUSCULAR | Status: DC
  Administered 2023-09-14: 500 mg via INTRAVENOUS
  Filled 2023-09-13: qty 10

## 2023-09-13 MED ORDER — ALBUTEROL SULFATE (2.5 MG/3ML) 0.083% IN NEBU: 2.5000 mg | INHALATION_SOLUTION | Freq: Four times a day (QID) | RESPIRATORY_TRACT | Status: DC | PRN

## 2023-09-13 MED ORDER — HYDRALAZINE HCL 20 MG/ML IJ SOLN: 10.0000 mg | INTRAMUSCULAR | Status: DC | PRN

## 2023-09-13 MED ORDER — CHLORHEXIDINE GLUCONATE CLOTH 2 % EX PADS: 6.0000 | MEDICATED_PAD | Freq: Every day | CUTANEOUS | Status: DC

## 2023-09-13 MED ORDER — OXYCODONE HCL 5 MG PO TABS
5.0000 mg | ORAL_TABLET | ORAL | Status: DC | PRN
  Administered 2023-09-14 – 2023-09-15 (×5): 5 mg via ORAL
  Filled 2023-09-13 (×5): qty 1
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
## 2023-09-13 NOTE — Progress Notes (Signed)
   09/13/23 1600  Spiritual Encounters  Type of Visit Initial  Referral source Trauma page  Reason for visit Trauma  OnCall Visit No  Interventions  Spiritual Care Interventions Made Compassionate presence   Chaplain responded to trauma level 1 page.  Patient is currently cared for by medical team.  No family/friends at bedside.   Chaplain spiritual support services remain available as the need arises.

## 2023-09-13 NOTE — ED Notes (Signed)
 Delay in CT due to vascular access difficulty and the need for blood.  Pt is ESRD, obese, and hypotensive.  EDP started x2 PIVs via US .
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
## 2023-09-13 NOTE — ED Notes (Signed)
 XR at bedside

## 2023-09-14 LAB — HEPATITIS B SURFACE ANTIGEN: Hepatitis B Surface Ag: NONREACTIVE

## 2023-09-14 LAB — CBC
HCT: 21.6 % — ABNORMAL LOW (ref 36.0–46.0)
HCT: 23.8 % — ABNORMAL LOW (ref 36.0–46.0)
Hemoglobin: 7 g/dL — ABNORMAL LOW (ref 12.0–15.0)
Hemoglobin: 7.8 g/dL — ABNORMAL LOW (ref 12.0–15.0)
MCH: 32.3 pg (ref 26.0–34.0)
MCH: 31.6 pg (ref 26.0–34.0)
MCHC: 32.4 g/dL (ref 30.0–36.0)
MCHC: 32.8 g/dL (ref 30.0–36.0)
MCV: 99.5 fL (ref 80.0–100.0)
MCV: 96.4 fL (ref 80.0–100.0)
Platelets: 256 K/uL (ref 150–400)
Platelets: 235 K/uL (ref 150–400)
RBC: 2.17 MIL/uL — ABNORMAL LOW (ref 3.87–5.11)
RBC: 2.47 MIL/uL — ABNORMAL LOW (ref 3.87–5.11)
RDW: 19.9 % — ABNORMAL HIGH (ref 11.5–15.5)
RDW: 20.4 % — ABNORMAL HIGH (ref 11.5–15.5)
WBC: 11.9 K/uL — ABNORMAL HIGH (ref 4.0–10.5)
WBC: 12.1 K/uL — ABNORMAL HIGH (ref 4.0–10.5)
nRBC: 0 % (ref 0.0–0.2)
nRBC: 0 % (ref 0.0–0.2)

## 2023-09-14 LAB — BASIC METABOLIC PANEL WITH GFR
Anion gap: 19 — ABNORMAL HIGH (ref 5–15)
Anion gap: 17 — ABNORMAL HIGH (ref 5–15)
BUN: 87 mg/dL — ABNORMAL HIGH (ref 6–20)
BUN: 53 mg/dL — ABNORMAL HIGH (ref 6–20)
CO2: 23 mmol/L (ref 22–32)
CO2: 26 mmol/L (ref 22–32)
Calcium: 9.5 mg/dL (ref 8.9–10.3)
Calcium: 9.2 mg/dL (ref 8.9–10.3)
Chloride: 92 mmol/L — ABNORMAL LOW (ref 98–111)
Chloride: 91 mmol/L — ABNORMAL LOW (ref 98–111)
Creatinine, Ser: 11.43 mg/dL — ABNORMAL HIGH (ref 0.44–1.00)
Creatinine, Ser: 7.81 mg/dL — ABNORMAL HIGH (ref 0.44–1.00)
GFR, Estimated: 4 mL/min — ABNORMAL LOW (ref >60)
GFR, Estimated: 6 mL/min — ABNORMAL LOW (ref >60)
Glucose, Bld: 89 mg/dL (ref 70–99)
Glucose, Bld: 106 mg/dL — ABNORMAL HIGH (ref 70–99)
Potassium: 4.6 mmol/L (ref 3.5–5.1)
Potassium: 4.3 mmol/L (ref 3.5–5.1)
Sodium: 134 mmol/L — ABNORMAL LOW (ref 135–145)
Sodium: 134 mmol/L — ABNORMAL LOW (ref 135–145)

## 2023-09-14 LAB — PREPARE RBC (CROSSMATCH)

## 2023-09-14 LAB — HEMOGLOBIN AND HEMATOCRIT, BLOOD
HCT: 19.1 % — ABNORMAL LOW (ref 36.0–46.0)
Hemoglobin: 6.3 g/dL — CL (ref 12.0–15.0)

## 2023-09-14 LAB — MAGNESIUM
Magnesium: 2.2 mg/dL (ref 1.7–2.4)
Magnesium: 2.1 mg/dL (ref 1.7–2.4)

## 2023-09-14 LAB — PHOSPHORUS: Phosphorus: 6.2 mg/dL — ABNORMAL HIGH (ref 2.5–4.6)

## 2023-09-14 MED ORDER — CHLORHEXIDINE GLUCONATE CLOTH 2 % EX PADS
6.0000 | MEDICATED_PAD | Freq: Every day | CUTANEOUS | Status: DC
  Administered 2023-09-14 – 2023-09-16 (×3): 6 via TOPICAL

## 2023-09-14 MED ORDER — HEPARIN SODIUM (PORCINE) 5000 UNIT/ML IJ SOLN
5000.0000 [IU] | Freq: Three times a day (TID) | INTRAMUSCULAR | Status: DC
  Administered 2023-09-15 – 2023-09-16 (×5): 5000 [IU] via SUBCUTANEOUS
  Filled 2023-09-14 (×5): qty 1

## 2023-09-14 MED ORDER — HEPARIN SODIUM (PORCINE) 1000 UNIT/ML DIALYSIS: 1000.0000 [IU] | INTRAMUSCULAR | Status: DC | PRN

## 2023-09-14 MED ORDER — ALTEPLASE 2 MG IJ SOLR: 2.0000 mg | Freq: Once | INTRAMUSCULAR | Status: DC | PRN

## 2023-09-14 MED ORDER — SODIUM CHLORIDE 0.9 % IV SOLN
25.0000 g | INTRAVENOUS | Status: DC
  Administered 2023-09-14 – 2023-09-16 (×2): 25 g via INTRAVENOUS
  Filled 2023-09-14 (×3): qty 100

## 2023-09-14 MED ORDER — LIDOCAINE-PRILOCAINE 2.5-2.5 % EX CREA: 1.0000 | TOPICAL_CREAM | CUTANEOUS | Status: DC | PRN

## 2023-09-14 MED ORDER — SODIUM THIOSULFATE 250 MG/ML IV SOLN: 25.0000 g | INTRAVENOUS | Status: DC

## 2023-09-14 MED ORDER — PENTAFLUOROPROP-TETRAFLUOROETH EX AERO: 1.0000 | INHALATION_SPRAY | CUTANEOUS | Status: DC | PRN

## 2023-09-14 MED ORDER — POLYETHYLENE GLYCOL 3350 17 G PO PACK
17.0000 g | PACK | Freq: Every day | ORAL | Status: DC
  Administered 2023-09-15: 17 g via ORAL
  Filled 2023-09-14 (×2): qty 1

## 2023-09-14 MED ORDER — LIDOCAINE HCL (PF) 1 % IJ SOLN: 5.0000 mL | INTRAMUSCULAR | Status: DC | PRN

## 2023-09-14 MED ORDER — METHOCARBAMOL 500 MG PO TABS
500.0000 mg | ORAL_TABLET | Freq: Three times a day (TID) | ORAL | Status: AC
  Administered 2023-09-14 – 2023-09-16 (×5): 500 mg via ORAL
  Filled 2023-09-14 (×5): qty 1

## 2023-09-14 MED ORDER — SODIUM CHLORIDE 0.9% IV SOLUTION: Freq: Once | INTRAVENOUS | Status: DC

## 2023-09-14 NOTE — Progress Notes (Signed)
 Pt receives out-pt HD at [facility], MWF, 0700 chair time. Will continue to assist.   Lavanda Fredrickson Dialysis Navigator [phone]
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
## 2023-09-14 NOTE — Progress Notes (Signed)
   09/14/23 1700  Vitals  Temp 97.8 F (36.6 C)  Pulse Rate 80  Resp 19  BP 128/72  SpO2 100 %  O2 Device Room Air  Weight 126.1 kg  Type of Weight Post-Dialysis  Post Treatment  Dialyzer Clearance Lightly streaked  Liters Processed 74.2  Fluid Removed (mL) 2900 mL  Tolerated HD Treatment Yes  AVG/AVF Arterial Site Held (minutes) 10 minutes  AVG/AVF Venous Site Held (minutes) 10 minutes   Pt tx completed at the pt bedside--[id number] Alert and oriented.  Informed consent signed and in chart.   TX duration:3.5  Patient tolerated well.   Alert, without acute distress.  Hand-off given to patient's nurse.   Access used: LUAF Access issues: no complications  Total UF removed: 2900----all blood volume was removed--300-so net uf is 2900 Medication(s) given: none   Delon LITTIE [facility]

## 2023-09-14 NOTE — Consult Note (Addendum)
 WOC Nurse Consult Note: Reason for Consult: leg wounds  Wound type: full thickness B lower legs ? Etiology  Pressure Injury POA: NA not in pressure areas  Measurement see nursing flowsheet  Wound azi:aojrx eschar with some tan necrotic  Drainage (amount, consistency, odor) see nursing flowsheet  Periwound: dry skin  Dressing procedure/placement/frequency: Cleanse B lower leg wounds with Vashe wound cleanser Soila [phone]) do not rinse and allow to air dry. Apply Xeroform gauze ([place] [phone]) to wound beds daily, cover with ABD pads and wrap with Kerlix roll gauze beginning right above toes and ending above wounds.   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  As per WOC previous consult note would recommend further assessment of these wounds by orthopedist. Patient should follow as an outpatient with wound care center or orthopedics for ongoing management.    POC discussed with bedside nurse. WOC team will not follow. Re-consult if further needs arise.   Thank you,    Powell Bar MSN, RN-BC, [employer]

## 2023-09-14 NOTE — Consult Note (Addendum)
 WOC Nurse Consult Note: Reason for Consult: Consult requested for bilat leg wounds.  Performed remotely after review of progress notes and photos in the EMR.   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  I do not see any notes in the EMR that she has been diagnosed with this complex medical condition or is currently being seen anywhere as an outpatient.  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 Please consult the ortho service for further recommendations.    Please re-consult if further assistance is needed.  Thank-you,  Stephane Fought MSN, RN, CWOCN, CWCN-AP, CNS Contact Mon-Fri 0700-1500: [phone]

## 2023-09-14 NOTE — Progress Notes (Addendum)
 Two runs of asymptomatic VT. Rec'd 1u pRBC with HD today, recheck this PM. Also send BMP/mag/phos. Tolerated CLD, adv to regular. Change transfer order to 4NP from medsurg.   Dreama GEANNIE Hanger, MD General and Trauma Surgery [facility] Surgery
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
## 2023-09-15 ENCOUNTER — Other Ambulatory Visit: Payer: Self-pay

## 2023-09-15 ENCOUNTER — Encounter (HOSPITAL_COMMUNITY): Payer: Self-pay

## 2023-09-15 ENCOUNTER — Other Ambulatory Visit: Admitting: Psychology

## 2023-09-15 LAB — CBC
HCT: 23.6 % — ABNORMAL LOW (ref 36.0–46.0)
Hemoglobin: 7.9 g/dL — ABNORMAL LOW (ref 12.0–15.0)
MCH: 31.7 pg (ref 26.0–34.0)
MCHC: 33.5 g/dL (ref 30.0–36.0)
MCV: 94.8 fL (ref 80.0–100.0)
Platelets: 258 K/uL (ref 150–400)
RBC: 2.49 MIL/uL — ABNORMAL LOW (ref 3.87–5.11)
RDW: 20 % — ABNORMAL HIGH (ref 11.5–15.5)
WBC: 12.5 K/uL — ABNORMAL HIGH (ref 4.0–10.5)
nRBC: 0 % (ref 0.0–0.2)

## 2023-09-15 LAB — BASIC METABOLIC PANEL WITH GFR
Anion gap: 22 — ABNORMAL HIGH (ref 5–15)
BUN: 54 mg/dL — ABNORMAL HIGH (ref 6–20)
CO2: 24 mmol/L (ref 22–32)
Calcium: 9.5 mg/dL (ref 8.9–10.3)
Chloride: 91 mmol/L — ABNORMAL LOW (ref 98–111)
Creatinine, Ser: 8.37 mg/dL — ABNORMAL HIGH (ref 0.44–1.00)
GFR, Estimated: 5 mL/min — ABNORMAL LOW (ref >60)
Glucose, Bld: 95 mg/dL (ref 70–99)
Potassium: 4.7 mmol/L (ref 3.5–5.1)
Sodium: 137 mmol/L (ref 135–145)

## 2023-09-15 LAB — HEPATITIS B SURFACE ANTIBODY, QUANTITATIVE: Hep B S AB Quant (Post): 96.1 m[IU]/mL

## 2023-09-15 MED ORDER — POLYETHYLENE GLYCOL 3350 17 G PO PACK
17.0000 g | PACK | Freq: Two times a day (BID) | ORAL | Status: DC
  Administered 2023-09-15: 17 g via ORAL
  Filled 2023-09-15: qty 1

## 2023-09-15 MED ORDER — OXYCODONE HCL 5 MG PO TABS
10.0000 mg | ORAL_TABLET | ORAL | Status: DC | PRN
  Administered 2023-09-15 – 2023-09-16 (×4): 10 mg via ORAL
  Filled 2023-09-15 (×4): qty 2
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
## 2023-09-15 NOTE — Progress Notes (Signed)
 Patient ID: [name], female   DOB: March 20, 1975, 48 y.o.   MRN: [id number]  During my visit with pt nephrology came in. 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 I tend to agree. As they are assuming care orthopedics will sign off at this time. If formal consult is still desired please let us  know.    Ozell DOROTHA Ned, PA-C Orthopedic Surgery [phone]
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
## 2023-09-15 NOTE — Consult Note (Addendum)
 WOC follow-up: Photo entered in the EMR  at 0930 was an error, please disregard. It has now been removed by IT department at 1145.  Please re-consult if further assistance is needed.  Thank-you,  Stephane Fought MSN, RN, CWOCN, CWCN-AP, CNS Contact Mon-Fri 0700-1500: [phone]
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
## 2023-09-16 ENCOUNTER — Encounter (HOSPITAL_COMMUNITY): Payer: Self-pay

## 2023-09-16 ENCOUNTER — Inpatient Hospital Stay (HOSPITAL_COMMUNITY)

## 2023-09-16 ENCOUNTER — Other Ambulatory Visit (HOSPITAL_COMMUNITY): Payer: Self-pay

## 2023-09-16 LAB — BASIC METABOLIC PANEL WITH GFR
Anion gap: 22 — ABNORMAL HIGH (ref 5–15)
BUN: 64 mg/dL — ABNORMAL HIGH (ref 6–20)
CO2: 22 mmol/L (ref 22–32)
Calcium: 9.6 mg/dL (ref 8.9–10.3)
Chloride: 91 mmol/L — ABNORMAL LOW (ref 98–111)
Creatinine, Ser: 9.74 mg/dL — ABNORMAL HIGH (ref 0.44–1.00)
GFR, Estimated: 4 mL/min — ABNORMAL LOW (ref >60)
Glucose, Bld: 82 mg/dL (ref 70–99)
Potassium: 4.6 mmol/L (ref 3.5–5.1)
Sodium: 135 mmol/L (ref 135–145)

## 2023-09-16 LAB — CBC
HCT: 20.8 % — ABNORMAL LOW (ref 36.0–46.0)
HCT: 23.8 % — ABNORMAL LOW (ref 36.0–46.0)
Hemoglobin: 6.8 g/dL — CL (ref 12.0–15.0)
Hemoglobin: 8.2 g/dL — ABNORMAL LOW (ref 12.0–15.0)
MCH: 31.3 pg (ref 26.0–34.0)
MCH: 32.5 pg (ref 26.0–34.0)
MCHC: 32.7 g/dL (ref 30.0–36.0)
MCHC: 34.5 g/dL (ref 30.0–36.0)
MCV: 95.9 fL (ref 80.0–100.0)
MCV: 94.4 fL (ref 80.0–100.0)
Platelets: 234 K/uL (ref 150–400)
Platelets: 247 K/uL (ref 150–400)
RBC: 2.17 MIL/uL — ABNORMAL LOW (ref 3.87–5.11)
RBC: 2.52 MIL/uL — ABNORMAL LOW (ref 3.87–5.11)
RDW: 18.9 % — ABNORMAL HIGH (ref 11.5–15.5)
RDW: 18.3 % — ABNORMAL HIGH (ref 11.5–15.5)
WBC: 10.5 K/uL (ref 4.0–10.5)
WBC: 10.7 K/uL — ABNORMAL HIGH (ref 4.0–10.5)
nRBC: 0 % (ref 0.0–0.2)
nRBC: 0.2 % (ref 0.0–0.2)

## 2023-09-16 LAB — PREPARE RBC (CROSSMATCH)

## 2023-09-16 MED ORDER — ACETAMINOPHEN 500 MG PO TABS: 1000.0000 mg | ORAL_TABLET | Freq: Four times a day (QID) | ORAL | Status: DC | PRN

## 2023-09-16 MED ORDER — DOCUSATE SODIUM 100 MG PO CAPS: 100.0000 mg | ORAL_CAPSULE | Freq: Every day | ORAL | Status: DC | PRN

## 2023-09-16 MED ORDER — BACITRACIN ZINC 500 UNIT/GM EX OINT
1.0000 | TOPICAL_OINTMENT | Freq: Two times a day (BID) | CUTANEOUS | Status: DC
  Filled 2023-09-16: qty 28.4

## 2023-09-16 MED ORDER — FERRIC CITRATE 1 GM 210 MG(FE) PO TABS
630.0000 mg | ORAL_TABLET | Freq: Three times a day (TID) | ORAL | Status: DC
  Administered 2023-09-16: 630 mg via ORAL
  Filled 2023-09-16 (×2): qty 3

## 2023-09-16 MED ORDER — POLYETHYLENE GLYCOL 3350 17 GM/SCOOP PO POWD
17.0000 g | Freq: Two times a day (BID) | ORAL | 0 refills | Status: DC
  Filled 2023-09-16: qty 238, 7d supply, fill #0

## 2023-09-16 MED ORDER — SODIUM CHLORIDE 0.9% IV SOLUTION: Freq: Once | INTRAVENOUS | Status: DC

## 2023-09-16 MED ORDER — GABAPENTIN 100 MG PO CAPS: 200.0000 mg | ORAL_CAPSULE | Freq: Every day | ORAL | Status: DC

## 2023-09-16 MED ORDER — MEGESTROL ACETATE 20 MG PO TABS
20.0000 mg | ORAL_TABLET | Freq: Every day | ORAL | Status: DC
  Administered 2023-09-16: 20 mg via ORAL
  Filled 2023-09-16: qty 1

## 2023-09-16 MED ORDER — FERRIC CITRATE 1 GM 210 MG(FE) PO TABS: 630.0000 mg | ORAL_TABLET | ORAL | Status: DC | PRN

## 2023-09-16 MED ORDER — IOHEXOL 350 MG/ML SOLN
75.0000 mL | Freq: Once | INTRAVENOUS | Status: AC | PRN
  Administered 2023-09-16: 75 mL via INTRAVENOUS

## 2023-09-16 MED ORDER — METHOCARBAMOL 500 MG PO TABS
500.0000 mg | ORAL_TABLET | Freq: Three times a day (TID) | ORAL | 0 refills | Status: DC | PRN
  Filled 2023-09-16: qty 30, 10d supply, fill #0

## 2023-09-16 MED ORDER — OXYCODONE HCL 10 MG PO TABS
10.0000 mg | ORAL_TABLET | Freq: Four times a day (QID) | ORAL | 0 refills | Status: DC | PRN
  Filled 2023-09-16: qty 30, 8d supply, fill #0

## 2023-09-16 NOTE — Progress Notes (Signed)
   09/16/23 1505  Vitals  Temp 98.3 F (36.8 C)  Temp Source Oral  BP (!) 147/86  MAP (mmHg) 106  BP Location Right Wrist  BP Method Automatic  Patient Position (if appropriate) Lying  Pulse Rate 85  Pulse Rate Source Monitor  ECG Heart Rate 85  Resp 17  Level of Consciousness  Level of Consciousness Alert  MEWS COLOR  MEWS Score Color Green  Oxygen Therapy  SpO2 98 %  O2 Device Room Air  MEWS Score  MEWS Temp 0  MEWS Systolic 0  MEWS Pulse 0  MEWS RR 0  MEWS LOC 0  MEWS Score 0   Received pt, A&Ox4, VSS, bed alarm on, call light within reach, and telemetry applied.

## 2023-09-16 NOTE — Progress Notes (Incomplete)
 Date and time results received: 09/16/23 0602 (use smartphrase .now to insert current time)  Test: Hgb Critical Value: 6.8  Name of Provider Notified: ***  Orders Received? Or Actions Taken?: {ED Critical Value actions [phone]

## 2023-09-16 NOTE — TOC Transition Note (Signed)
 Transition of Care [facility]) - Discharge Note   Patient Details  Name: [name] MRN: [id number] Date of Birth: June 09, 1975  Transition of Care [facility]) CM/SW Contact:  Abigail Morath M, RN Phone Number: 09/16/2023, 4:21 PM   Clinical Narrative:    Patient for likely discharge today pending lab results returned.  PT recommending OP follow up and RW for home.  Patient agreeable to recommended f/u and DME.  Bariatric recommended, but patient does not meet weight requirement (must be 350 lbs or more).  Referral to [facility] for RW, to be delivered to bedside prior to dc.  Referral sent to [facility] on [facility] for continued physical therapy.     Final next level of care: OP Rehab Barriers to Discharge: Barriers Resolved            Discharge Plan and Services Additional resources added to the After Visit Summary for     Discharge Planning Services: CM Consult            DME Arranged: [name] DME Agency: [facility] Date DME Agency Contacted: 09/16/23 Time DME Agency Contacted: [phone] Representative spoke with at DME Agency: [name]            Social Drivers of Health (SDOH) Interventions SDOH Screenings   Food Insecurity: No Food Insecurity (09/15/2023)  Housing: Low Risk  (09/15/2023)  Transportation Needs: No Transportation Needs (09/15/2023)  Utilities: Not At Risk (09/15/2023)  Alcohol Screen: Low Risk  (02/09/2022)  Depression (PHQ2-9): Low Risk  (10/21/2020)  Financial Resource Strain: Low Risk  (02/09/2022)  Physical Activity: Insufficiently Active (02/09/2022)  Social Connections: Moderately Integrated (09/15/2023)  Stress: No Stress Concern Present (02/09/2022)  Tobacco Use: Low Risk  (09/15/2023)     Readmission Risk Interventions     No data to display         Abigail MICAEL Fass, RN, BSN  Trauma/Neuro ICU Case Manager [phone]

## 2023-09-16 NOTE — Progress Notes (Addendum)
 AVS and discharge education provided to pt and pt's sister. Pt verbalized understanding of discharge teaching. RN provided education on drsg changes to bilateral lower legs. RN provided pt with wound drsg supplies (Vasche, xerofoam gauze, abd pads, tape, kerlix, and bacitracin  ointment). PIV removed with no complications. Tele removed and CCMD called. TOC medications and AVS handed to pt. Pt transported off unit via wheelchair with NT and all personal belongings and DME equipment (bariatric front wheel walker). Pt's sister to transport pt home.

## 2023-09-16 NOTE — Progress Notes (Signed)
 Went to CT scan via transport from Dialysis

## 2023-09-16 NOTE — Progress Notes (Signed)
Pt. off unit to Hemodialysis

## 2023-09-16 NOTE — Progress Notes (Signed)
 Received patient in bed to unit.  Alert and oriented.  Informed consent signed and in chart.   TX duration:4 hours  Patient tolerated well.  Transported back to the room  Alert, without acute distress.  Hand-off given to patient's nurse.   Access used: Left AV Fistula Upper arm Access issues: None  Total UF removed: 3L Medication(s) given: Sodium Thiosulfate , Tylenol , 1 Unit of Blood   09/16/23 1353  Vitals  Temp 98 F (36.7 C)  BP (!) 168/97  Pulse Rate 79  Resp 17  Oxygen Therapy  SpO2 100 %  O2 Device Room Air  Patient Activity (if Appropriate) In bed  Pulse Oximetry Type Continuous  During Treatment Monitoring  Blood Flow Rate (mL/min) 400 mL/min  Arterial Pressure (mmHg) -161 mmHg  Venous Pressure (mmHg) 395.13 mmHg  TMP (mmHg) 16.16 mmHg  Ultrafiltration Rate (mL/min) 917 mL/min  Dialysate Flow Rate (mL/min) 299 ml/min  Dialysate Potassium Concentration 2  Dialysate Calcium  Concentration 2.5  Duration of HD Treatment -hour(s) 4 hour(s)  Cumulative Fluid Removed (mL) per Treatment  3000.12  HD Safety Checks Performed Yes  Intra-Hemodialysis Comments Tolerated well;Tx completed  Post Treatment  Dialyzer Clearance Clear  Liters Processed 94.8  Fluid Removed (mL) 3000 mL  Tolerated HD Treatment Yes  Post-Hemodialysis Comments 1 unit of blood given  AVG/AVF Arterial Site Held (minutes) 7 minutes  AVG/AVF Venous Site Held (minutes) 7 minutes  Fistula / Graft Left Upper arm Arteriovenous fistula  Placement Date/Time: 10/12/21 0900   Placed prior to admission: No  Orientation: Left  Access Location: Upper arm  Access Type: (c) Arteriovenous fistula  Site Condition No complications  Fistula / Graft Assessment Present;Thrill;Bruit  Status Patent  Drainage Description None  Fistula / Graft Right Upper arm  Placement Date/Time: 07/28/15 (c) 1500   Orientation: Right  Access Location: Upper arm  Status Deaccessed     Camellia Brasil LPN Kidney Dialysis Unit
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
## 2023-09-16 NOTE — Discharge Instructions (Signed)
 Wound care  Cleanse lower leg wounds with Vashe wound cleanser do not rinse and allow to air dry. Apply Xeroform gauze to wound beds daily, cover with ABD pads and wrap with Kerlix roll gauze beginning right above toes and ending above wounds.
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
## 2023-09-17 LAB — BPAM RBC
Blood Product Expiration Date: 202509252359
Blood Product Expiration Date: 202509192359
Blood Product Expiration Date: 202509172359
ISSUE DATE / TIME: 202509120940
ISSUE DATE / TIME: 202509101516
ISSUE DATE / TIME: 202509091703
Unit Type and Rh: 6200
Unit Type and Rh: 6200
Unit Type and Rh: 9500

## 2023-09-17 LAB — TYPE AND SCREEN
ABO/RH(D): A POS
Antibody Screen: NEGATIVE
Unit division: 0
Unit division: 0
Unit division: 0
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
## 2023-09-18 ENCOUNTER — Telehealth: Payer: Self-pay | Admitting: Nephrology

## 2023-09-18 NOTE — Telephone Encounter (Signed)
 Transition of Care Contact from Inpatient Facility  Date of Discharge: 09/16/23 Date of Contact: 09/18/23 Method of contact: phone - attempted  Attempted to contact patient to discuss transition of care from inpatient admission.  Patient did not answer the phone.  Will attempt to call them again and if unable to reach will follow up at dialysis.  Manuelita Labella, PA-C [employer]

## 2023-09-19 NOTE — Progress Notes (Signed)
 Late note entry 9/15 1040 D/c orders noted over weekend. Contacted out-pt HD to inform of pt d/c and should have arrived this morning. No further support needed at this time.   Lavanda Cherae Marton Dialysis Navigator [phone]

## 2023-09-22 ENCOUNTER — Other Ambulatory Visit: Admitting: Psychology

## 2023-09-22 NOTE — Progress Notes (Signed)
 DAP note Client name: [name] Therapist name: Camie Norris [name], [name] Date: 08/04/2023 Time: 60 mins  DATA: Client reports that her leg continues to get worse. She states that she has been to the emergency room twice and the urgent care and they really have not helped her. She states that it still hurts so bad and that they are finally planning to send her to a specialist. Client reports that her depression level is at a 5 this week and would like to try and get it down to a 2 with continued use of interventions.   ASSESSMENT: Client was still visibly in pain today. She continues to have to work and push through but it is wearing on her. It is not only taking a toll on her physically but also mentally. Hopefully she will get the answers that she needs from the specialist.    PLAN: Client will attend therapy bi-weekly, with a goal of consistently attending 2 sessions per month. Client will attend dialysis 3 times per week with a goal of 3 times per week to maintain her best physical health. Client will take a moment of mindfulness each day and utilize box breathing as interventions to help with the decrease in depression. Client will check in daily with herself to evaluate her daily needs and try to maintain those needs for her overall best health.

## 2023-09-29 ENCOUNTER — Encounter (HOSPITAL_COMMUNITY): Payer: Self-pay | Admitting: *Deleted

## 2023-09-29 ENCOUNTER — Other Ambulatory Visit: Admitting: Psychology

## 2023-09-29 ENCOUNTER — Observation Stay (HOSPITAL_COMMUNITY)
Admission: EM | Admit: 2023-09-29 | Discharge: 2023-09-30 | Disposition: A | Discharge status: Home Health | Attending: Internal Medicine | Admitting: Internal Medicine

## 2023-09-29 ENCOUNTER — Other Ambulatory Visit: Payer: Self-pay

## 2023-09-29 DIAGNOSIS — Z992 Dependence on renal dialysis: Secondary | ICD-10-CM | POA: Insufficient documentation

## 2023-09-29 DIAGNOSIS — S81801A Unspecified open wound, right lower leg, initial encounter: Secondary | ICD-10-CM

## 2023-09-29 DIAGNOSIS — I10 Essential (primary) hypertension: Secondary | ICD-10-CM | POA: Diagnosis present

## 2023-09-29 DIAGNOSIS — S81802A Unspecified open wound, left lower leg, initial encounter: Secondary | ICD-10-CM | POA: Diagnosis not present

## 2023-09-29 DIAGNOSIS — T148XXA Other injury of unspecified body region, initial encounter: Secondary | ICD-10-CM | POA: Diagnosis present

## 2023-09-29 DIAGNOSIS — L089 Local infection of the skin and subcutaneous tissue, unspecified: Principal | ICD-10-CM | POA: Insufficient documentation

## 2023-09-29 DIAGNOSIS — X58XXXA Exposure to other specified factors, initial encounter: Secondary | ICD-10-CM | POA: Diagnosis not present

## 2023-09-29 DIAGNOSIS — E782 Mixed hyperlipidemia: Secondary | ICD-10-CM | POA: Diagnosis present

## 2023-09-29 DIAGNOSIS — Z6841 Body Mass Index (BMI) 40.0 and over, adult: Secondary | ICD-10-CM | POA: Insufficient documentation

## 2023-09-29 DIAGNOSIS — I120 Hypertensive chronic kidney disease with stage 5 chronic kidney disease or end stage renal disease: Secondary | ICD-10-CM | POA: Insufficient documentation

## 2023-09-29 DIAGNOSIS — J45909 Unspecified asthma, uncomplicated: Secondary | ICD-10-CM | POA: Diagnosis not present

## 2023-09-29 DIAGNOSIS — N186 End stage renal disease: Secondary | ICD-10-CM | POA: Diagnosis not present

## 2023-09-29 DIAGNOSIS — E785 Hyperlipidemia, unspecified: Secondary | ICD-10-CM | POA: Diagnosis not present

## 2023-09-29 LAB — I-STAT CG4 LACTIC ACID, ED: Lactic Acid, Venous: 1.3 mmol/L (ref 0.5–1.9)

## 2023-09-29 LAB — COMPREHENSIVE METABOLIC PANEL WITH GFR
ALT: 13 U/L (ref 0–44)
AST: 16 U/L (ref 15–41)
Albumin: 2.7 g/dL — ABNORMAL LOW (ref 3.5–5.0)
Alkaline Phosphatase: 48 U/L (ref 38–126)
Anion gap: 20 — ABNORMAL HIGH (ref 5–15)
BUN: 34 mg/dL — ABNORMAL HIGH (ref 6–20)
CO2: 23 mmol/L (ref 22–32)
Calcium: 9.7 mg/dL (ref 8.9–10.3)
Chloride: 92 mmol/L — ABNORMAL LOW (ref 98–111)
Creatinine, Ser: 9.2 mg/dL — ABNORMAL HIGH (ref 0.44–1.00)
GFR, Estimated: 5 mL/min — ABNORMAL LOW (ref >60)
Glucose, Bld: 95 mg/dL (ref 70–99)
Potassium: 3.8 mmol/L (ref 3.5–5.1)
Sodium: 135 mmol/L (ref 135–145)
Total Bilirubin: 1.1 mg/dL (ref 0.0–1.2)
Total Protein: 7.6 g/dL (ref 6.5–8.1)

## 2023-09-29 LAB — CBC
HCT: 26.9 % — ABNORMAL LOW (ref 36.0–46.0)
Hemoglobin: 8.6 g/dL — ABNORMAL LOW (ref 12.0–15.0)
MCH: 31.6 pg (ref 26.0–34.0)
MCHC: 32 g/dL (ref 30.0–36.0)
MCV: 98.9 fL (ref 80.0–100.0)
Platelets: 239 K/uL (ref 150–400)
RBC: 2.72 MIL/uL — ABNORMAL LOW (ref 3.87–5.11)
RDW: 15.9 % — ABNORMAL HIGH (ref 11.5–15.5)
WBC: 14.1 K/uL — ABNORMAL HIGH (ref 4.0–10.5)
nRBC: 0 % (ref 0.0–0.2)

## 2023-09-29 LAB — C-REACTIVE PROTEIN: CRP: 12.1 mg/dL — ABNORMAL HIGH (ref <1.0)

## 2023-09-29 LAB — PROCALCITONIN: Procalcitonin: 1.21 ng/mL

## 2023-09-29 LAB — SEDIMENTATION RATE: Sed Rate: 113 mm/h — ABNORMAL HIGH (ref 0–22)

## 2023-09-29 MED ORDER — SODIUM THIOSULFATE 250 MG/ML IV SOLN
25.0000 g | INTRAVENOUS | Status: DC
  Administered 2023-09-30: 25 g via INTRAVENOUS
  Filled 2023-09-29 (×2): qty 100

## 2023-09-29 MED ORDER — CHLORHEXIDINE GLUCONATE CLOTH 2 % EX PADS: 6.0000 | MEDICATED_PAD | Freq: Every day | CUTANEOUS | Status: DC

## 2023-09-29 MED ORDER — CINACALCET HCL 30 MG PO TABS: 180.0000 mg | ORAL_TABLET | ORAL | Status: DC

## 2023-09-29 MED ORDER — ACETAMINOPHEN 500 MG PO TABS
1000.0000 mg | ORAL_TABLET | Freq: Three times a day (TID) | ORAL | Status: DC
  Administered 2023-09-29 – 2023-09-30 (×4): 1000 mg via ORAL
  Filled 2023-09-29 (×4): qty 2

## 2023-09-29 MED ORDER — PANTOPRAZOLE SODIUM 40 MG PO TBEC
40.0000 mg | DELAYED_RELEASE_TABLET | Freq: Every day | ORAL | Status: DC
  Administered 2023-09-29: 40 mg via ORAL
  Filled 2023-09-29: qty 1

## 2023-09-29 MED ORDER — OXYCODONE HCL 5 MG PO TABS
5.0000 mg | ORAL_TABLET | ORAL | Status: DC | PRN
  Administered 2023-09-29: 5 mg via ORAL
  Filled 2023-09-29: qty 1

## 2023-09-29 MED ORDER — GABAPENTIN 100 MG PO CAPS
100.0000 mg | ORAL_CAPSULE | Freq: Three times a day (TID) | ORAL | Status: DC
  Administered 2023-09-29 – 2023-09-30 (×3): 100 mg via ORAL
  Filled 2023-09-29 (×3): qty 1

## 2023-09-29 MED ORDER — HEPARIN SODIUM (PORCINE) 5000 UNIT/ML IJ SOLN
5000.0000 [IU] | Freq: Three times a day (TID) | INTRAMUSCULAR | Status: DC
  Administered 2023-09-29 – 2023-09-30 (×4): 5000 [IU] via SUBCUTANEOUS
  Filled 2023-09-29 (×4): qty 1

## 2023-09-29 MED ORDER — ONDANSETRON HCL 4 MG/2ML IJ SOLN
4.0000 mg | Freq: Once | INTRAMUSCULAR | Status: AC
  Administered 2023-09-29: 4 mg via INTRAVENOUS
  Filled 2023-09-29: qty 2

## 2023-09-29 MED ORDER — POLYETHYLENE GLYCOL 3350 17 G PO PACK
17.0000 g | PACK | Freq: Two times a day (BID) | ORAL | Status: DC
  Administered 2023-09-29 (×2): 17 g via ORAL
  Filled 2023-09-29 (×2): qty 1

## 2023-09-29 MED ORDER — HYDROMORPHONE HCL 1 MG/ML IJ SOLN
1.0000 mg | INTRAMUSCULAR | Status: DC | PRN
  Administered 2023-09-29 – 2023-09-30 (×4): 1 mg via INTRAVENOUS
  Filled 2023-09-29 (×3): qty 1

## 2023-09-29 MED ORDER — FLUTICASONE FUROATE-VILANTEROL 100-25 MCG/ACT IN AEPB
1.0000 | INHALATION_SPRAY | Freq: Every day | RESPIRATORY_TRACT | Status: DC
  Filled 2023-09-29: qty 28

## 2023-09-29 MED ORDER — METHOCARBAMOL 500 MG PO TABS
500.0000 mg | ORAL_TABLET | Freq: Three times a day (TID) | ORAL | Status: DC
Start: 2023-09-29 — End: 2023-09-30
  Administered 2023-09-29 – 2023-09-30 (×4): 500 mg via ORAL
  Filled 2023-09-29 (×4): qty 1

## 2023-09-29 MED ORDER — CEFAZOLIN SODIUM-DEXTROSE 2-4 GM/100ML-% IV SOLN
2.0000 g | Freq: Once | INTRAVENOUS | Status: AC
  Administered 2023-09-29: 2 g via INTRAVENOUS
  Filled 2023-09-29: qty 100

## 2023-09-29 MED ORDER — SEVELAMER CARBONATE 800 MG PO TABS
2400.0000 mg | ORAL_TABLET | Freq: Two times a day (BID) | ORAL | Status: DC | PRN
  Administered 2023-09-30: 2400 mg via ORAL

## 2023-09-29 MED ORDER — IPRATROPIUM-ALBUTEROL 0.5-2.5 (3) MG/3ML IN SOLN: 3.0000 mL | Freq: Four times a day (QID) | RESPIRATORY_TRACT | Status: DC | PRN

## 2023-09-29 MED ORDER — FENTANYL CITRATE PF 50 MCG/ML IJ SOSY
100.0000 ug | PREFILLED_SYRINGE | Freq: Once | INTRAMUSCULAR | Status: AC
  Administered 2023-09-29: 100 ug via INTRAVENOUS
  Filled 2023-09-29: qty 2

## 2023-09-29 MED ORDER — FENTANYL CITRATE PF 50 MCG/ML IJ SOSY
50.0000 ug | PREFILLED_SYRINGE | Freq: Once | INTRAMUSCULAR | Status: AC
  Administered 2023-09-29: 50 ug via INTRAVENOUS
  Filled 2023-09-29: qty 1

## 2023-09-29 MED ORDER — GABAPENTIN 100 MG PO CAPS
200.0000 mg | ORAL_CAPSULE | Freq: Every day | ORAL | Status: DC
Start: 2023-09-29 — End: 2023-09-29

## 2023-09-29 MED ORDER — SEVELAMER CARBONATE 800 MG PO TABS
2400.0000 mg | ORAL_TABLET | Freq: Three times a day (TID) | ORAL | Status: DC
  Administered 2023-09-29 (×2): 2400 mg via ORAL
  Filled 2023-09-29 (×4): qty 3

## 2023-09-29 MED ORDER — METHOCARBAMOL 500 MG PO TABS: 500.0000 mg | ORAL_TABLET | Freq: Three times a day (TID) | ORAL | Status: DC | PRN

## 2023-09-29 NOTE — H&P (Addendum)
 History and Physical    Patient: [name] FMW:[id number] DOB: 02-28-1975 DOA: 09/29/2023 DOS: the patient was seen and examined on 09/29/2023 PCP: Luvenia Sherry, FNP  Patient coming from: Home  Chief Complaint:  Chief Complaint  Patient presents with   Wound Check   HPI: [name] is a 48 y.o. 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  The patient presented with pain (described as 18/10) and non-healing wounds on BLEs, which had been present for over a month. The patient had been managing wound care independently without professional assistance. The patient reported that the wound appeared discolored, turning black, and seemed not to be healing. Of note, she was recently referred for OP wound care per her nephrologist, but the subsequent appointment was scheduled too far out (Oct 15), prompting the patient to seek immediate medical attention this morning.  In the ED, Labs notable for Cr 9.2 and WBC 14.1. CT abd showed no acute process, but did document known improving pelvic hemoperitoneum (etiology unknown), and 19 mm ovoid nodular density in the left lower lobe with mild surrounding ground-glass density c/f infectious vs inflammatory process. EDP requested admission for wound care and pain control.   Review of Systems: As mentioned in the history of present illness. All other systems reviewed and are negative. Past Medical History:  Diagnosis Date   Anemia    Anxiety    pt not aware of this   Arthritis    Breast mass 09/01/2016   Targeted ultrasound is performed, showing a mass in the left breast at 11 o'clock, 1 cm from the nipple measuring 6.4 by 5.0 cm. The mass is heterogeneous and may contain macroscopic fat.   IMPRESSION: The mass in the left breast is probably benign, possibly a Hamartoma.  RECOMMENDATION: Recommend six-month follow-up mammogram of  the probably benign left breast mass. If necessary, an ultrasound co   CHF (congestive heart failure) (HCC)    hosp. 2011   Dilated cardiomyopathy (HCC)    Encounter for blood transfusion    ESRD on hemodialysis (HCC)    [facility] MWF   Family history of adverse reaction to anesthesia      Aunt has a tracheostomy and kept coughing after colonoscopy    Foot fracture    Gout    no flare up for several yrs   Headache    Heart murmur    as a child, no problems as an adult   History of blood transfusion    x 2   History of pneumonia    Hyperlipidemia    Hypertension    Menorrhagia    Morbid obesity (HCC)    Pneumonia    hosp. 2010   Shortness of breath    HX CHL - occasional. 04/09/21 patient sits down and shortness of breath susides.   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   Thyroid disease    Past Surgical History:  Procedure Laterality Date   A/V FISTULAGRAM Left 08/13/2021   Procedure: A/V Fistulagram;  Surgeon: Gretta Lonni PARAS, MD;  Location: [facility] INVASIVE CV LAB;  Service: Cardiovascular;  Laterality: Left;   ANGIOPLASTY  07/28/2015   Procedure: ANGIOPLASTY OF RIGHT BRACHIAL VEIN;  Surgeon: Redell LITTIE Door, MD;  Location: [facility] OR;  Service: Vascular;;   AV FISTULA PLACEMENT  08/04/2011   Procedure: ARTERIOVENOUS (AV) FISTULA CREATION;  Surgeon: Carlin FORBES Haddock, MD;  Location: [facility] OR;  Service: Vascular;  Laterality: Right;  Creation right brachiocephalic arteriovenous fistula   AV FISTULA PLACEMENT Left 02/05/2013   Procedure: ARTERIOVENOUS FISTULA CREATION LEFT ARM;  Surgeon: Redell LITTIE Door, MD;  Location: [facility] OR;  Service: Vascular;  Laterality: Left;   AV FISTULA PLACEMENT Right 06/04/2013   Procedure: ARTERIOVENOUS (AV) FISTULA CREATION;  Surgeon: Redell LITTIE Door, MD;  Location: [facility] OR;  Service: Vascular;  Laterality: Right;   AV FISTULA PLACEMENT  Right 04/15/2014   Procedure: INSERTION OF ARTERIOVENOUS (AV) GORE-TEX GRAFT ARM;  Surgeon: Redell LITTIE Door, MD;  Location: [facility] OR;  Service: Vascular;  Laterality: Right;   AV FISTULA PLACEMENT Left 02/25/2021   Procedure: LEFT ARM ARTERIOVENOUS (AV) FISTULA CREATION;  Surgeon: Gretta Lonni PARAS, MD;  Location: [facility] OR;  Service: Vascular;  Laterality: Left;   AV FISTULA PLACEMENT Left 08/26/2021   Procedure: LEFT ARM LIGATION OF BRACHIOBASILIC FISTULA,  REPAIR PSEUDOANEURYSM;  Surgeon: Gretta Lonni PARAS, MD;  Location: [facility] OR;  Service: Vascular;  Laterality: Left;   AV FISTULA PLACEMENT Left 10/12/2021   Procedure: LEFT RADIOCEPHALIC ARTERIOVENOUS FISTULA  ARTERIOVENOUS (AV) FISTULA CREATION WITH INTERPOSITION GRAFT;  Surgeon: Gretta Lonni PARAS, MD;  Location: [facility] OR;  Service: Vascular;  Laterality: Left;   BASCILIC VEIN TRANSPOSITION Right 08/13/2013   Procedure: Second Stage Brachial Vein Transposition;  Surgeon: Redell LITTIE Door, MD;  Location: [facility] OR;  Service: Vascular;  Laterality: Right;   BASCILIC VEIN TRANSPOSITION Left 04/13/2021   Procedure: LEFT ARM SECOND STAGE BASILIC VEIN TRANSPOSITION;  Surgeon: Gretta Lonni PARAS, MD;  Location: [facility] OR;  Service: Vascular;  Laterality: Left;   CARDIAC SURGERY  1979   repair of hole in heart   GRAFT APPLICATION Left 10/12/2021   Procedure: ARTEGRAFT COLLAGEN VASCULAR GRAFT APPLICATION;  Surgeon: Gretta Lonni PARAS, MD;  Location: [facility] OR;  Service: Vascular;  Laterality: Left;   INCISION AND DRAINAGE OF WOUND Right 07/28/2015   Procedure: IRRIGATION AND DEBRIDEMENT RIGHT HAND  WOUND;  Surgeon: Balinda Rogue, MD;  Location: [facility] OR;  Service: Plastics;  Laterality: Right;   LIGATION OF ARTERIOVENOUS  FISTULA Left 03/05/2013   Procedure: LIGATION OF ARTERIOVENOUS  FISTULA;  Surgeon: Redell LITTIE Door, MD;  Location: [facility] OR;  Service: Vascular;  Laterality: Left;   PATCH ANGIOPLASTY Left 03/05/2013   Procedure: PATCH ANGIOPLASTY;  Surgeon: Redell LITTIE Door, MD;  Location:  [facility] OR;  Service: Vascular;  Laterality: Left;   PERIPHERAL VASCULAR BALLOON ANGIOPLASTY Left 08/13/2021   Procedure: PERIPHERAL VASCULAR BALLOON ANGIOPLASTY;  Surgeon: Gretta Lonni PARAS, MD;  Location: [facility] INVASIVE CV LAB;  Service: Cardiovascular;  Laterality: Left;  arm fistula   PERIPHERAL VASCULAR CATHETERIZATION Right 10/31/2014   Procedure: Fistulagram;  Surgeon: Redell LITTIE Door, MD;  Location: [facility] INVASIVE CV LAB;  Service: Cardiovascular;  Laterality: Right;   PERIPHERAL VASCULAR CATHETERIZATION Right 10/31/2014   Procedure: Peripheral Vascular Balloon Angioplasty;  Surgeon: Redell LITTIE Door, MD;  Location: [facility] INVASIVE CV LAB;  Service: Cardiovascular;  Laterality: Right;  pta venous rt arm   PERIPHERAL VASCULAR CATHETERIZATION N/A 03/13/2015   Procedure: A/V Shuntogram;  Surgeon: Redell LITTIE Door, MD;  Location: [facility] INVASIVE CV LAB;  Service: Cardiovascular;  Laterality: N/A;   PERIPHERAL VASCULAR CATHETERIZATION Right 03/13/2015   Procedure: Peripheral Vascular Balloon Angioplasty;  Surgeon: Redell LITTIE Door, MD;  Location: [facility] INVASIVE CV LAB;  Service:  Cardiovascular;  Laterality: Right;  Shunt   PERIPHERAL VASCULAR CATHETERIZATION Right 01/19/2016   Procedure: A/V Shuntogram;  Surgeon: Lonni GORMAN Blade, MD;  Location: [facility] INVASIVE CV LAB;  Service: Cardiovascular;  Laterality: Right;   REVISION OF ARTERIOVENOUS GORETEX GRAFT Right 10/27/2015   Procedure: THROMBECTOMY AND REVISION OF RIGHT UPPER ARM  ARTERIOVENOUS GORETEX GRAFT USING X 10CM GORE-TEX GRAFT;  Surgeon: Carlin FORBES Haddock, MD;  Location: [facility] OR;  Service: Vascular;  Laterality: Right;   REVISION OF ARTERIOVENOUS GORETEX GRAFT Right 01/30/2016   Procedure: REVISION OF RIGHT UPPER ARM ARTERIOVENOUS USING GORETEX GRAFT;  Surgeon: Lonni GORMAN Blade, MD;  Location: [facility] OR;  Service: Vascular;  Laterality: Right;   THROMBECTOMY W/ EMBOLECTOMY Left 03/05/2013   Procedure: THROMBECTOMY OF LEFT BRACHIAL ARTERY;  Surgeon: Redell LITTIE Door, MD;  Location:  [facility] OR;  Service: Vascular;  Laterality: Left;   VENOGRAM Bilateral 03/25/2014   Procedure: VENOGRAM bilateral;  Surgeon: Redell LITTIE Door, MD;  Location: [facility] CATH LAB;  Service: Cardiovascular;  Laterality: Bilateral;   Social History:  reports that she has never smoked. She has never been exposed to tobacco smoke. She has never used smokeless tobacco. She reports that she does not drink alcohol and does not use drugs.  Allergies  Allergen Reactions   Pollen Extract Itching    Unknown   Oxycodone -Acetaminophen  Itching    Tolerates with benadryl      Family History  Problem Relation Age of Onset   Diabetes Mother    Hyperlipidemia Mother    Hypertension Mother    Kidney disease Mother    Anesthesia problems Neg Hx    Hypotension Neg Hx    Malignant hyperthermia Neg Hx    Pseudochol deficiency Neg Hx    Colon cancer Neg Hx    Esophageal cancer Neg Hx    Inflammatory bowel disease Neg Hx    Liver disease Neg Hx    Pancreatic cancer Neg Hx    Rectal cancer Neg Hx    Stomach cancer Neg Hx     Prior to Admission medications   Medication Sig Start Date End Date Taking? Authorizing Provider  acetaminophen  (TYLENOL ) 500 MG tablet Take 2 tablets (1,000 mg total) by mouth every 6 (six) hours as needed for mild pain (pain score 1-3) or fever. 09/16/23  Yes Vicci Burnard SAUNDERS, PA-C  albuterol  (VENTOLIN  HFA) 108 (90 Base) MCG/ACT inhaler Inhale 1-2 puffs into the lungs every 6 (six) hours as needed for wheezing or shortness of breath. 01/01/23  Yes Crain, Whitney L, PA  bacitracin  ointment Apply 1 Application topically 2 (two) times daily. Patient taking differently: Apply 1 Application topically daily as needed for wound care. 07/17/23  Yes Simon Lavonia SAILOR, MD  docusate sodium  (COLACE) 100 MG capsule Take 1 capsule (100 mg total) by mouth daily as needed for mild constipation. 09/16/23  Yes Vicci Burnard R, PA-C  gabapentin  (NEURONTIN ) 100 MG capsule Take 200 mg by mouth at bedtime.   Yes [provider]  megestrol  (MEGACE ) 20 MG tablet Take 1 tablet (20 mg total) by mouth daily. 11/02/22  Yes Ajewole, Christana, MD  methocarbamol  (ROBAXIN ) 500 MG tablet Take 1 tablet (500 mg total) by mouth every 8 (eight) hours as needed for muscle spasms. 09/16/23  Yes Vicci Burnard R, PA-C  pantoprazole  (PROTONIX ) 40 MG tablet Take 1 tablet (40 mg total) by mouth daily. 01/12/22  Yes Mansouraty, Aloha Raddle., MD  polyethylene glycol powder (GLYCOLAX /MIRALAX ) 17 GM/SCOOP powder Dissolve 1 capful (17g)  in 4-8 ounces of liquid and take by mouth daily 2 (two) times daily. 09/16/23  Yes Vicci Sor R, PA-C  sevelamer  carbonate (RENVELA ) 800 MG tablet TAKE 3 TABLETS BY MOUTH BEFORE MEALS THREE TIMES DAILY AND 3 TABLETS BEFORE SNACKS TWICE A DAY 08/10/23  Yes [provider]  SYMBICORT 80-4.5 MCG/ACT inhaler Inhale 2 puffs into the lungs daily. 07/28/23  Yes [provider]  Oxycodone  HCl 10 MG TABS Take 1 tablet (10 mg total) by mouth every 6 (six) hours as needed for moderate pain (pain score 4-6) or severe pain (pain score 7-10). Patient not taking: Reported on 09/29/2023 09/16/23   Vicci Sor SAUNDERS, PA-C    Physical Exam: Vitals:   09/29/23 1000 09/29/23 1030 09/29/23 1100 09/29/23 1145  BP: 116/76 118/77 126/83 110/68  Pulse: (!) 106   78  Resp: 19 18 (!) 21 12  Temp:      TempSrc:      SpO2:    100%  Weight:      Height:       General: Alert, oriented x3, resting comfortably in no acute distress Respiratory: Lungs clear to auscultation bilaterally with normal respiratory effort; no w/r/r Cardiovascular: Regular rate and rhythm w/o m/r/g Skin: Weeping dark colored wounds on BLEs   Data Reviewed:  Lab Results  Component Value Date   WBC 14.1 (H) 09/29/2023   HGB 8.6 (L) 09/29/2023   HCT 26.9 (L) 09/29/2023   MCV 98.9 09/29/2023   PLT 239 09/29/2023   Lab Results  Component Value Date   GLUCOSE 95 09/29/2023   CALCIUM  9.7 09/29/2023   NA 135 09/29/2023   K 3.8  09/29/2023   CO2 23 09/29/2023   CL 92 (L) 09/29/2023   BUN 34 (H) 09/29/2023   CREATININE 9.20 (H) 09/29/2023   Lab Results  Component Value Date   ALT 13 09/29/2023   AST 16 09/29/2023   ALKPHOS 48 09/29/2023   BILITOT 1.1 09/29/2023   Lab Results  Component Value Date   INR 1.3 (H) 09/13/2023   INR 1.21 02/28/2014   INR 1.19 01/28/2013   Radiology: No results found.   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  BLE wounds c/f calciphylaxis -PT/OT consulted to eval for SNF need iso worsening wounds; apprec eval/recs -Wound care consulted; apprec eval/recs -Multimodal pain control with PO tylenol , robaxin , and gabapentin  at bedtime, as well as IV dilaudid  1mg  q3h prn (transition to oral once pain well controlled on stable dosing) -F/u ESR/CRP to eval for OM; if elevated may need MRI to exlcude OM -F/u US  VAS ABI WWO TBI to eval for PAD given worsening chronic wounds; if abnl, pt will need vascular surgery consult for completeness sake (although surgery is typically avoided in these pts)  ESRD -Renal consulted per EDP; apprec eval/recs -PTA Renvela  -Renal diet  Asthma -Breztri BID and Duonebs prn   Advance Care Planning:   Code Status: Full Code   Consults: Renal  Family Communication: Sister  Severity of Illness: The appropriate patient status for this patient is INPATIENT. Inpatient status is judged to be reasonable and necessary in order to provide the required intensity of service to ensure the patient's safety. The patient's presenting symptoms, physical exam findings, and initial radiographic and laboratory data in the context of their chronic comorbidities is felt to place them at high risk for further clinical deterioration. Furthermore, it  is not anticipated that the patient will be medically stable for discharge from the hospital within 2 midnights of admission.   *  I certify that at the point of admission it is my clinical judgment that the patient will require inpatient hospital care spanning beyond 2 midnights from the point of admission due to high intensity of service, high risk for further deterioration and high frequency of surveillance required.*   ------- I spent 55 minutes reviewing previous notes, at the bedside counseling/discussing the treatment plan, and performing clinical documentation.  Author: Marsha Ada, MD 09/29/2023 2:00 PM  For on call review [email or web address].

## 2023-09-29 NOTE — Plan of Care (Signed)

## 2023-09-29 NOTE — Evaluation (Signed)
 Occupational Therapy Evaluation Patient Details Name: [name] MRN: [id number] DOB: 1975/06/27 Today's Date: 09/29/2023   History of Present Illness   Pt is a 48 y/o female presenting with increased pain and foul drainage from chronic bilateral lower extremity wounds in setting of  calciphylaxis. PMH:  HTN, HFrEF,  ESRD on HD non-compliance, bil LE wounds (calciphylaxis), anemia, heart murmur, syncope, thyroid disease, thromboembolism of UE artery     Clinical Impressions PTA, pt lives alone in first floor apartment, typically Modified Independent with ADLs, IADLs, driving and mobility with intermittent RW use d/t BLE pain. Pt works in housekeeping. Pt presents now with limitations due to BLE wound pain but able to manage in-room mobility using RW with CGA and Setup-Min A for ADLs. Pt reports once pain more controlled that she will not have concerns managing at home and this OT agrees. Will continue to follow while admitted.      If plan is discharge home, recommend the following:   Assist for transportation     Functional Status Assessment   Patient has had a recent decline in their functional status and demonstrates the ability to make significant improvements in function in a reasonable and predictable amount of time.     Equipment Recommendations   None recommended by OT     Recommendations for Other Services         Precautions/Restrictions   Precautions Precautions: Fall Restrictions Weight Bearing Restrictions Per Provider Order: No     Mobility Bed Mobility Overal bed mobility: Modified Independent Bed Mobility: Supine to Sit, Sit to Supine                Transfers Overall transfer level: Needs assistance Equipment used: Rolling walker (2 wheels) Transfers: Sit to/from Stand Sit to Stand: Contact guard assist           General transfer comment: CGA and increased time to rise to full upright position with RW      Balance Overall  balance assessment: Needs assistance Sitting-balance support: No upper extremity supported, Feet supported Sitting balance-Leahy Scale: Good     Standing balance support: Bilateral upper extremity supported, No upper extremity supported, During functional activity Standing balance-Leahy Scale: Fair                             ADL either performed or assessed with clinical judgement   ADL Overall ADL's : Needs assistance/impaired Eating/Feeding: Independent   Grooming: Set up;Sitting   Upper Body Bathing: Set up;Sitting   Lower Body Bathing: Supervison/ safety;Sitting/lateral leans;Sit to/from stand   Upper Body Dressing : Set up;Sitting   Lower Body Dressing: Minimal assistance;Sit to/from stand Lower Body Dressing Details (indicate cue type and reason): assist for B socks Toilet Transfer: Contact guard assist;Rolling walker (2 wheels)   Toileting- Clothing Manipulation and Hygiene: Supervision/safety       Functional mobility during ADLs: Contact guard assist;Rolling walker (2 wheels)       Vision Ability to See in Adequate Light: 0 Adequate Patient Visual Report: No change from baseline Vision Assessment?: No apparent visual deficits     Perception         Praxis         Pertinent Vitals/Pain Pain Assessment Pain Assessment: Faces Faces Pain Scale: Hurts even more Pain Location: bil legs Pain Descriptors / Indicators: Grimacing, Guarding, Sore Pain Intervention(s): Monitored during session     Extremity/Trunk Assessment Upper Extremity Assessment Upper Extremity  Assessment: Overall WFL for tasks assessed;Right hand dominant   Lower Extremity Assessment Lower Extremity Assessment: Defer to PT evaluation   Cervical / Trunk Assessment Cervical / Trunk Assessment: Normal   Communication Communication Communication: No apparent difficulties   Cognition Arousal: Alert Behavior During Therapy: WFL for tasks assessed/performed Cognition: No  apparent impairments                               Following commands: Intact       Cueing  General Comments   Cueing Techniques: Verbal cues  Spo2 95% on RA   Exercises     Shoulder Instructions      Home Living Family/patient expects to be discharged to:: Private residence Living Arrangements: Alone Available Help at Discharge: Friend(s);Available PRN/intermittently;Family Type of Home: Apartment Home Access: Level entry     Home Layout: One level     Bathroom Shower/Tub: Chief Strategy Officer: Standard     Home Equipment: Agricultural consultant (2 wheels)          Prior Functioning/Environment Prior Level of Function : Independent/Modified Independent;Working/employed;Driving             Mobility Comments: No AD typically but occasionally will use RW to get warmed up when starting day ADLs Comments: Works for a IT consultant, Education officer, environmental business buildings, Mod I for all ADLs, IADLs, driving. drives self to HD    OT Problem List: Pain   OT Treatment/Interventions: Self-care/ADL training;Therapeutic activities;Balance training      OT Goals(Current goals can be found in the care plan section)   Acute Rehab OT Goals Patient Stated Goal: pain control, assist with dressing changes OT Goal Formulation: With patient Time For Goal Achievement: 10/13/23 Potential to Achieve Goals: Good ADL Goals Pt Will Perform Lower Body Dressing: with modified independence;sit to/from stand;sitting/lateral leans Pt Will Transfer to Toilet: with modified independence;ambulating Additional ADL Goal #1: Pt to increase standing activity tolerance > 10 min during ADLs/mobility without need for seated rest break   OT Frequency:  Min 2X/week    Co-evaluation              AM-PAC OT 6 Clicks Daily Activity     Outcome Measure Help from another person eating meals?: None Help from another person taking care of personal grooming?: A Little Help  from another person toileting, which includes using toliet, bedpan, or urinal?: A Little Help from another person bathing (including washing, rinsing, drying)?: A Little Help from another person to put on and taking off regular upper body clothing?: A Little Help from another person to put on and taking off regular lower body clothing?: A Little 6 Click Score: 19   End of Session Equipment Utilized During Treatment: Rolling walker (2 wheels)  Activity Tolerance: Patient tolerated treatment well Patient left: in bed;with call bell/phone within reach  OT Visit Diagnosis: Unsteadiness on feet (R26.81)                Time: 8695-8677 OT Time Calculation (min): 18 min Charges:  OT General Charges $OT Visit: 1 Visit OT Evaluation $OT Eval Low Complexity: 1 Low  Mliss NOVAK, OTR/L Acute Rehab Services Office: [phone]   Mliss Fish 09/29/2023, 1:56 PM

## 2023-09-29 NOTE — Consult Note (Addendum)
 WOC Nurse Consult Note:  WOC consult performed remotely utilizing imaging and chart review. Reason for Consult:BLE wounds c/f calciphylaxis  Wound type: BLE full thickness wounds, calciphylaxis Pressure Injury POA: NA Measurement: see nursing flow sheets Wound bed:  R LE: 50% black eschar 40% yellow slough, 10% pink tissue L LE: 50% black eschar, 30% yellow slough, 20% pink tissue Drainage (amount, consistency, odor) see nursing flowsheets: PA notes indicate foul smelling Drainage Periwound: intact Dressing procedure/placement/frequency:   RLE: Cleanse wound with Vashe ([id number]),pat dry.  Place Aquacel AG (lawson [phone]) over wound bed and cover with ABD pad.  Wrap with Kerlix and change daily. LLE: Cleanse wound with Vashe ([id number]),pat dry.  Place Xeroform over wound bed and cover with ABD pad.  Wrap with Kerlix and change daily.  Patient would benefit from an outpatient wound care referral at discharge for continued wound care.  WOC team will not follow at this time.  Please re-consult if new needs arise.   Thank you,  Doyal Polite, MSN, RN, [facility] WOC Team [phone] (Available Mon-Fri 0700-1500)

## 2023-09-29 NOTE — Consult Note (Signed)
 [facility] Renal Consultation Note    Indication for Consultation:  Management of ESRD/hemodialysis; anemia, hypertension/volume and secondary hyperparathyroidism   HPI: [name] is a 48 y.o. 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 She presents with worsening pain and drainage from the wounds. Plan is admission for wound care and pain control. Nephrology consulted for dialysis needs. Labs notable for mild leukocytosis. She was last dialyzed on Monday via her AVF. She did complete a full treatment. States she just couldn't manage the pain anymore. She denies fevers/chills, nausea/vomiting, chest pain/sob.     Past Medical History:  Diagnosis Date   Anemia    Anxiety    pt not aware of this   Arthritis    Breast mass 09/01/2016   Targeted ultrasound is performed, showing a mass in the left breast at 11 o'clock, 1 cm from the nipple measuring 6.4 by 5.0 cm. The mass is heterogeneous and may contain macroscopic fat.   IMPRESSION: The mass in the left breast is probably benign, possibly a Hamartoma.  RECOMMENDATION: Recommend six-month follow-up mammogram of the probably benign left breast mass. If necessary, an ultrasound co   CHF (congestive heart failure) (HCC)    hosp. 2011   Dilated cardiomyopathy (HCC)    Encounter for blood transfusion    ESRD on hemodialysis (HCC)    [facility] MWF   Family history of adverse reaction to anesthesia      Aunt has a tracheostomy and kept coughing after colonoscopy    Foot fracture    Gout    no flare up for several yrs   Headache    Heart murmur    as a child, no problems as an adult   History of blood transfusion    x 2   History of pneumonia    Hyperlipidemia    Hypertension    Menorrhagia    Morbid obesity (HCC)    Pneumonia    hosp. 2010   Shortness of breath    HX CHL - occasional. 04/09/21 patient sits down and shortness of breath susides.   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   Thyroid disease    Past Surgical History:  Procedure Laterality Date   A/V FISTULAGRAM Left 08/13/2021   Procedure: A/V Fistulagram;  Surgeon: Gretta Lonni PARAS, MD;  Location: [facility] INVASIVE CV LAB;  Service: Cardiovascular;  Laterality: Left;   ANGIOPLASTY  07/28/2015   Procedure: ANGIOPLASTY OF RIGHT BRACHIAL VEIN;  Surgeon: Redell LITTIE Door, MD;  Location: [facility] OR;  Service: Vascular;;   AV FISTULA PLACEMENT  08/04/2011   Procedure: ARTERIOVENOUS (AV) FISTULA CREATION;  Surgeon: Carlin FORBES Haddock, MD;  Location: [facility] OR;  Service: Vascular;  Laterality: Right;  Creation right brachiocephalic arteriovenous fistula   AV FISTULA PLACEMENT Left 02/05/2013   Procedure: ARTERIOVENOUS FISTULA CREATION LEFT ARM;  Surgeon: Redell LITTIE Door, MD;  Location: [facility] OR;  Service: Vascular;  Laterality: Left;   AV FISTULA PLACEMENT Right 06/04/2013   Procedure: ARTERIOVENOUS (AV) FISTULA CREATION;  Surgeon: Redell LITTIE Door, MD;  Location: [facility] OR;  Service: Vascular;  Laterality: Right;   AV FISTULA PLACEMENT Right 04/15/2014   Procedure: INSERTION OF ARTERIOVENOUS (AV) GORE-TEX GRAFT ARM;  Surgeon: Redell LITTIE Door, MD;  Location: [facility] OR;  Service: Vascular;  Laterality:  Right;   AV FISTULA PLACEMENT Left 02/25/2021   Procedure: LEFT ARM ARTERIOVENOUS (AV) FISTULA CREATION;  Surgeon: Gretta Lonni PARAS, MD;  Location: [facility] OR;  Service: Vascular;  Laterality: Left;   AV FISTULA PLACEMENT Left 08/26/2021   Procedure: LEFT ARM LIGATION OF BRACHIOBASILIC FISTULA,  REPAIR PSEUDOANEURYSM;  Surgeon: Gretta Lonni PARAS, MD;  Location: [facility] OR;  Service: Vascular;  Laterality: Left;   AV FISTULA PLACEMENT Left 10/12/2021   Procedure: LEFT RADIOCEPHALIC ARTERIOVENOUS FISTULA  ARTERIOVENOUS (AV) FISTULA CREATION WITH INTERPOSITION GRAFT;  Surgeon: Gretta Lonni PARAS, MD;  Location: [facility] OR;   Service: Vascular;  Laterality: Left;   BASCILIC VEIN TRANSPOSITION Right 08/13/2013   Procedure: Second Stage Brachial Vein Transposition;  Surgeon: Redell LITTIE Door, MD;  Location: [facility] OR;  Service: Vascular;  Laterality: Right;   BASCILIC VEIN TRANSPOSITION Left 04/13/2021   Procedure: LEFT ARM SECOND STAGE BASILIC VEIN TRANSPOSITION;  Surgeon: Gretta Lonni PARAS, MD;  Location: [facility] OR;  Service: Vascular;  Laterality: Left;   CARDIAC SURGERY  1979   repair of hole in heart   GRAFT APPLICATION Left 10/12/2021   Procedure: ARTEGRAFT COLLAGEN VASCULAR GRAFT APPLICATION;  Surgeon: Gretta Lonni PARAS, MD;  Location: [facility] OR;  Service: Vascular;  Laterality: Left;   INCISION AND DRAINAGE OF WOUND Right 07/28/2015   Procedure: IRRIGATION AND DEBRIDEMENT RIGHT HAND  WOUND;  Surgeon: Balinda Rogue, MD;  Location: [facility] OR;  Service: Plastics;  Laterality: Right;   LIGATION OF ARTERIOVENOUS  FISTULA Left 03/05/2013   Procedure: LIGATION OF ARTERIOVENOUS  FISTULA;  Surgeon: Redell LITTIE Door, MD;  Location: [facility] OR;  Service: Vascular;  Laterality: Left;   PATCH ANGIOPLASTY Left 03/05/2013   Procedure: PATCH ANGIOPLASTY;  Surgeon: Redell LITTIE Door, MD;  Location: [facility] OR;  Service: Vascular;  Laterality: Left;   PERIPHERAL VASCULAR BALLOON ANGIOPLASTY Left 08/13/2021   Procedure: PERIPHERAL VASCULAR BALLOON ANGIOPLASTY;  Surgeon: Gretta Lonni PARAS, MD;  Location: [facility] INVASIVE CV LAB;  Service: Cardiovascular;  Laterality: Left;  arm fistula   PERIPHERAL VASCULAR CATHETERIZATION Right 10/31/2014   Procedure: Fistulagram;  Surgeon: Redell LITTIE Door, MD;  Location: [facility] INVASIVE CV LAB;  Service: Cardiovascular;  Laterality: Right;   PERIPHERAL VASCULAR CATHETERIZATION Right 10/31/2014   Procedure: Peripheral Vascular Balloon Angioplasty;  Surgeon: Redell LITTIE Door, MD;  Location: [facility] INVASIVE CV LAB;  Service: Cardiovascular;  Laterality: Right;  pta venous rt arm   PERIPHERAL VASCULAR CATHETERIZATION N/A 03/13/2015   Procedure: A/V  Shuntogram;  Surgeon: Redell LITTIE Door, MD;  Location: [facility] INVASIVE CV LAB;  Service: Cardiovascular;  Laterality: N/A;   PERIPHERAL VASCULAR CATHETERIZATION Right 03/13/2015   Procedure: Peripheral Vascular Balloon Angioplasty;  Surgeon: Redell LITTIE Door, MD;  Location: [facility] INVASIVE CV LAB;  Service: Cardiovascular;  Laterality: Right;  Shunt   PERIPHERAL VASCULAR CATHETERIZATION Right 01/19/2016   Procedure: A/V Shuntogram;  Surgeon: Lonni GORMAN Blade, MD;  Location: [facility] INVASIVE CV LAB;  Service: Cardiovascular;  Laterality: Right;   REVISION OF ARTERIOVENOUS GORETEX GRAFT Right 10/27/2015   Procedure: THROMBECTOMY AND REVISION OF RIGHT UPPER ARM  ARTERIOVENOUS GORETEX GRAFT USING X 10CM GORE-TEX GRAFT;  Surgeon: Carlin FORBES Haddock, MD;  Location: [facility] OR;  Service: Vascular;  Laterality: Right;   REVISION OF ARTERIOVENOUS GORETEX GRAFT Right 01/30/2016   Procedure: REVISION OF RIGHT UPPER ARM ARTERIOVENOUS USING GORETEX GRAFT;  Surgeon: Lonni GORMAN Blade, MD;  Location: [facility] OR;  Service: Vascular;  Laterality: Right;   THROMBECTOMY W/ EMBOLECTOMY Left 03/05/2013   Procedure: THROMBECTOMY OF LEFT  BRACHIAL ARTERY;  Surgeon: Redell LITTIE Door, MD;  Location: [facility] OR;  Service: Vascular;  Laterality: Left;   VENOGRAM Bilateral 03/25/2014   Procedure: VENOGRAM bilateral;  Surgeon: Redell LITTIE Door, MD;  Location: [facility] CATH LAB;  Service: Cardiovascular;  Laterality: Bilateral;   Family History  Problem Relation Age of Onset   Diabetes Mother    Hyperlipidemia Mother    Hypertension Mother    Kidney disease Mother    Anesthesia problems Neg Hx    Hypotension Neg Hx    Malignant hyperthermia Neg Hx    Pseudochol deficiency Neg Hx    Colon cancer Neg Hx    Esophageal cancer Neg Hx    Inflammatory bowel disease Neg Hx    Liver disease Neg Hx    Pancreatic cancer Neg Hx    Rectal cancer Neg Hx    Stomach cancer Neg Hx    Social History:  reports that she has never smoked. She has never been exposed to tobacco  smoke. She has never used smokeless tobacco. She reports that she does not drink alcohol and does not use drugs. Allergies  Allergen Reactions   Pollen Extract Itching    Unknown   Oxycodone -Acetaminophen  Itching    Tolerates with benadryl     Prior to Admission medications   Medication Sig Start Date End Date Taking? Authorizing Provider  acetaminophen  (TYLENOL ) 500 MG tablet Take 2 tablets (1,000 mg total) by mouth every 6 (six) hours as needed for mild pain (pain score 1-3) or fever. 09/16/23  Yes Vicci Burnard SAUNDERS, PA-C  albuterol  (VENTOLIN  HFA) 108 (90 Base) MCG/ACT inhaler Inhale 1-2 puffs into the lungs every 6 (six) hours as needed for wheezing or shortness of breath. 01/01/23  Yes Crain, Whitney L, PA  bacitracin  ointment Apply 1 Application topically 2 (two) times daily. Patient taking differently: Apply 1 Application topically daily as needed for wound care. 07/17/23  Yes Simon Lavonia SAILOR, MD  docusate sodium  (COLACE) 100 MG capsule Take 1 capsule (100 mg total) by mouth daily as needed for mild constipation. 09/16/23  Yes Vicci Burnard SAUNDERS, PA-C  gabapentin  (NEURONTIN ) 100 MG capsule Take 200 mg by mouth at bedtime.   Yes [provider]  megestrol  (MEGACE ) 20 MG tablet Take 1 tablet (20 mg total) by mouth daily. 11/02/22  Yes Ajewole, Christana, MD  methocarbamol  (ROBAXIN ) 500 MG tablet Take 1 tablet (500 mg total) by mouth every 8 (eight) hours as needed for muscle spasms. 09/16/23  Yes Vicci Burnard R, PA-C  pantoprazole  (PROTONIX ) 40 MG tablet Take 1 tablet (40 mg total) by mouth daily. 01/12/22  Yes Mansouraty, Gabriel Jr., MD  polyethylene glycol powder (GLYCOLAX /MIRALAX ) 17 GM/SCOOP powder Dissolve 1 capful (17g) in 4-8 ounces of liquid and take by mouth daily 2 (two) times daily. 09/16/23  Yes Vicci Burnard R, PA-C  sevelamer  carbonate (RENVELA ) 800 MG tablet TAKE 3 TABLETS BY MOUTH BEFORE MEALS THREE TIMES DAILY AND 3 TABLETS BEFORE SNACKS TWICE A DAY 08/10/23  Yes [provider]  SYMBICORT 80-4.5 MCG/ACT inhaler Inhale 2 puffs into the lungs daily. 07/28/23  Yes [provider]  Oxycodone  HCl 10 MG TABS Take 1 tablet (10 mg total) by mouth every 6 (six) hours as needed for moderate pain (pain score 4-6) or severe pain (pain score 7-10). Patient not taking: Reported on 09/29/2023 09/16/23   Vicci Burnard SAUNDERS, PA-C   Current Facility-Administered Medications  Medication Dose Route Frequency Provider Last Rate Last Admin   acetaminophen  (TYLENOL )  tablet 1,000 mg  1,000 mg Oral TID Georgina Basket, MD   1,000 mg at 09/29/23 9041   fluticasone  furoate-vilanterol (BREO ELLIPTA ) 100-25 MCG/ACT 1 puff  1 puff Inhalation Daily Georgina Basket, MD       gabapentin  (NEURONTIN ) capsule 200 mg  200 mg Oral QHS Georgina Basket, MD       ipratropium-albuterol  (DUONEB) 0.5-2.5 (3) MG/3ML nebulizer solution 3 mL  3 mL Nebulization Q6H PRN Georgina Basket, MD       methocarbamol  (ROBAXIN ) tablet 500 mg  500 mg Oral TID Georgina Basket, MD   500 mg at 09/29/23 0957   oxyCODONE  (Oxy IR/ROXICODONE ) immediate release tablet 5 mg  5 mg Oral Q4H PRN Georgina Basket, MD       pantoprazole  (PROTONIX ) EC tablet 40 mg  40 mg Oral Daily Moore, Willie, MD   40 mg at 09/29/23 9041   polyethylene glycol (MIRALAX  / GLYCOLAX ) packet 17 g  17 g Oral BID Georgina Basket, MD   17 g at 09/29/23 1000   sevelamer  carbonate (RENVELA ) tablet 2,400 mg  2,400 mg Oral TID WC Moore, Willie, MD       sevelamer  carbonate (RENVELA ) tablet 2,400 mg  2,400 mg Oral BID BM PRN Merilee Sharyne FERNS, [facility]       Current Outpatient Medications  Medication Sig Dispense Refill   acetaminophen  (TYLENOL ) 500 MG tablet Take 2 tablets (1,000 mg total) by mouth every 6 (six) hours as needed for mild pain (pain score 1-3) or fever.     albuterol  (VENTOLIN  HFA) 108 (90 Base) MCG/ACT inhaler Inhale 1-2 puffs into the lungs every 6 (six) hours as needed for wheezing or shortness of breath. 8 g 0   bacitracin  ointment Apply 1  Application topically 2 (two) times daily. (Patient taking differently: Apply 1 Application topically daily as needed for wound care.) 120 g 0   docusate sodium  (COLACE) 100 MG capsule Take 1 capsule (100 mg total) by mouth daily as needed for mild constipation.     gabapentin  (NEURONTIN ) 100 MG capsule Take 200 mg by mouth at bedtime.     megestrol  (MEGACE ) 20 MG tablet Take 1 tablet (20 mg total) by mouth daily. 60 tablet 6   methocarbamol  (ROBAXIN ) 500 MG tablet Take 1 tablet (500 mg total) by mouth every 8 (eight) hours as needed for muscle spasms. 30 tablet 0   pantoprazole  (PROTONIX ) 40 MG tablet Take 1 tablet (40 mg total) by mouth daily. 30 tablet 12   polyethylene glycol powder (GLYCOLAX /MIRALAX ) 17 GM/SCOOP powder Dissolve 1 capful (17g) in 4-8 ounces of liquid and take by mouth daily 2 (two) times daily. 238 g 0   sevelamer  carbonate (RENVELA ) 800 MG tablet TAKE 3 TABLETS BY MOUTH BEFORE MEALS THREE TIMES DAILY AND 3 TABLETS BEFORE SNACKS TWICE A DAY     SYMBICORT 80-4.5 MCG/ACT inhaler Inhale 2 puffs into the lungs daily.     Oxycodone  HCl 10 MG TABS Take 1 tablet (10 mg total) by mouth every 6 (six) hours as needed for moderate pain (pain score 4-6) or severe pain (pain score 7-10). (Patient not taking: Reported on 09/29/2023) 30 tablet 0     ROS: As per HPI otherwise negative.  Physical Exam: Vitals:   09/29/23 0907 09/29/23 0930 09/29/23 1000 09/29/23 1030  BP: 132/83 131/81 116/76 118/77  Pulse: 82  (!) 106   Resp: 16 16 19 18   Temp: 98.3 F (36.8 C)     TempSrc: Oral  SpO2: 100%     Weight:      Height:         General: Appears comfortable, in no distress  Head: NCAT sclera not icteric MMM Neck: Supple. No JVD appreciated  Lungs: Clear bilaterally without wheezes, rales, or rhonchi. Normal WOB  Heart: RRR, no murmur, rub, or gallop  Abdomen: soft non-tender, bowel sounds normal, no masses  Lower extremities: bilateral shin wounds bandaged (did not remove)  Neuro:  A & O X 3. Moves all extremities spontaneously. Psych:  Responds to questions appropriately with a normal affect. Dialysis Access: LUE AVF +bruit   Labs: Basic Metabolic Panel: Recent Labs  Lab 09/29/23 0310  NA 135  K 3.8  CL 92*  CO2 23  GLUCOSE 95  BUN 34*  CREATININE 9.20*  CALCIUM  9.7   Liver Function Tests: Recent Labs  Lab 09/29/23 0310  AST 16  ALT 13  ALKPHOS 48  BILITOT 1.1  PROT 7.6  ALBUMIN 2.7*   No results for input(s): LIPASE, AMYLASE in the last 168 hours. No results for input(s): AMMONIA in the last 168 hours. CBC: Recent Labs  Lab 09/29/23 0310  WBC 14.1*  HGB 8.6*  HCT 26.9*  MCV 98.9  PLT 239   Cardiac Enzymes: No results for input(s): CKTOTAL, CKMB, CKMBINDEX, TROPONINI in the last 168 hours. CBG: No results for input(s): GLUCAP in the last 168 hours. Iron  Studies: No results for input(s): IRON , TIBC, TRANSFERRIN, FERRITIN in the last 72 hours. Studies/Results: No results found.  Dialysis Orders:  [facility] M[facility] 4:00 450/800 EDW 122.7kg 2K/2Ca AVF  -Heparin  4800 units + 4000 units intermittent -Mircera 100 q 2 wks (last 9/22) -Cinacalcet  180 q HD -Na thiosulfate 25g q HD   Assessment/Plan: Bilateral LE wounds/calciphylaxis. Wound care and pain meds per primary team. Will continue sodium thiosulfate  with HD.  ESRD.  HD MWF.  Missed dialysis Wednesday but declines to go today. No urgent dialysis indication today. Next HD Friday.   Hypertension/volume. BP controlled. Mild volume on exam.  Anemia. Hgb 8s. ESA recently dosed as outpatient. Follow trends.  Metabolic bone disease.  Continue cinacalcet . Check phos with HD.   Maisie Ronnald Acosta PA-C [facility] 09/29/2023, 11:30 AM

## 2023-09-29 NOTE — ED Provider Notes (Signed)
 Care assumed from PA Ileana Eck at shift change, please see her note for full details but in brief [name] is a 48 y.o. female, presents with worsenign wounds to bilateral lower extremities.  Recently admitted for the same and at that time wounds were thought to be due to calciphylaxis but they have since gotten worse and have developed some foul-smelling drainage.  Last received dialysis yesterday.  Workup with normal lactic acid, mild leukocytosis.  Care signed out pending call to the hospitalist team for admission and to nephrology to arrange dialysis.  Case discussed with Dr. Tobie with nephrology, they will see patient and arrange for dialysis during hospital admission.   Case discussed with Dr. Georgina with [facility] hospitalist who will see and admit the patient.   Abigail Larraine FALCON, PA-C 09/29/23 9178    Tegeler, Abigail PARAS, MD 09/29/23 [phone]

## 2023-09-29 NOTE — ED Triage Notes (Signed)
 Pt reports she is a dialysis pt with wounds to bilateral lower legs, presents with bilateral leg pain and wound checks, pt reports white/yellow drainage with redness and warm to touch, denies fevers, pain 10/10

## 2023-09-29 NOTE — Care Management Obs Status (Signed)
 MEDICARE OBSERVATION STATUS NOTIFICATION   Patient Details  Name: [name] MRN: [id number] Date of Birth: [birth year]   Medicare Observation Status Notification Given:  Yes  Verbally reviewed observation notice with [name] telephonically at [phone].  Will leave a copy in the patient room  [name] 09/29/2023, 4:00 PM

## 2023-09-29 NOTE — ED Provider Notes (Signed)
 [place] EMERGENCY DEPARTMENT AT [facility] Provider Note   CSN: [id number] Arrival date & time: 09/29/23  9756     Patient presents with: Wound Check   [name] is a 48 y.o. female past medical history significant for ESRD on dialysis, CHF, hypertension, and obesity presents today for an wound check.  Patient has bilateral lower extremity wounds that are chronic.  Patient reporting increased pain, yellow drainage, warmth, and erythema. Patient denies fever, chill, nausea, vomiting, cough, congestion, numbness, tingling, any other complaints at this time.    Wound Check       Prior to Admission medications   Medication Sig Start Date End Date Taking? Authorizing Provider  acetaminophen  (TYLENOL ) 500 MG tablet Take 2 tablets (1,000 mg total) by mouth every 6 (six) hours as needed for mild pain (pain score 1-3) or fever. 09/16/23  Yes Vicci Burnard SAUNDERS, PA-C  albuterol  (VENTOLIN  HFA) 108 (90 Base) MCG/ACT inhaler Inhale 1-2 puffs into the lungs every 6 (six) hours as needed for wheezing or shortness of breath. 01/01/23  Yes Crain, Whitney L, PA  bacitracin  ointment Apply 1 Application topically 2 (two) times daily. Patient taking differently: Apply 1 Application topically daily as needed for wound care. 07/17/23  Yes Simon Lavonia SAILOR, MD  docusate sodium  (COLACE) 100 MG capsule Take 1 capsule (100 mg total) by mouth daily as needed for mild constipation. 09/16/23  Yes Vicci Burnard SAUNDERS, PA-C  gabapentin  (NEURONTIN ) 100 MG capsule Take 200 mg by mouth at bedtime.   Yes [provider]  megestrol  (MEGACE ) 20 MG tablet Take 1 tablet (20 mg total) by mouth daily. 11/02/22  Yes Ajewole, Christana, MD  methocarbamol  (ROBAXIN ) 500 MG tablet Take 1 tablet (500 mg total) by mouth every 8 (eight) hours as needed for muscle spasms. 09/16/23  Yes Vicci Burnard SAUNDERS, PA-C  pantoprazole  (PROTONIX ) 40 MG tablet Take 1 tablet (40 mg total) by mouth daily. 01/12/22  Yes Mansouraty,  Gabriel Jr., MD  polyethylene glycol powder (GLYCOLAX /MIRALAX ) 17 GM/SCOOP powder Dissolve 1 capful (17g) in 4-8 ounces of liquid and take by mouth daily 2 (two) times daily. 09/16/23  Yes Vicci Burnard R, PA-C  sevelamer  carbonate (RENVELA ) 800 MG tablet TAKE 3 TABLETS BY MOUTH BEFORE MEALS THREE TIMES DAILY AND 3 TABLETS BEFORE SNACKS TWICE A DAY 08/10/23  Yes [provider]  SYMBICORT 80-4.5 MCG/ACT inhaler Inhale 2 puffs into the lungs daily. 07/28/23  Yes [provider]  Oxycodone  HCl 10 MG TABS Take 1 tablet (10 mg total) by mouth every 6 (six) hours as needed for moderate pain (pain score 4-6) or severe pain (pain score 7-10). Patient not taking: Reported on 09/29/2023 09/16/23   Vicci Burnard SAUNDERS, PA-C    Allergies: Pollen extract and Oxycodone -acetaminophen     Review of Systems  Skin:  Positive for wound.    Updated Vital Signs BP (!) 124/91   Pulse 85   Temp 98.3 F (36.8 C)   Resp 17   Ht 5' 8 (1.727 m)   Wt 123.7 kg   SpO2 90%   BMI 41.47 kg/m   Physical Exam Vitals and nursing note reviewed.  Constitutional:      General: She is not in acute distress.    Appearance: She is well-developed. She is obese. She is not diaphoretic.     Comments: Uncomfortable appearing and tearful.  HENT:     Head: Normocephalic and atraumatic.     Right Ear: External ear normal.  Left Ear: External ear normal.     Nose: Nose normal.     Mouth/Throat:     Mouth: Mucous membranes are moist.  Eyes:     Extraocular Movements: Extraocular movements intact.     Conjunctiva/sclera: Conjunctivae normal.  Cardiovascular:     Rate and Rhythm: Normal rate and regular rhythm.     Pulses: Normal pulses.     Heart sounds: Normal heart sounds. No murmur heard. Pulmonary:     Effort: Pulmonary effort is normal. No respiratory distress.     Breath sounds: Normal breath sounds.  Abdominal:     Palpations: Abdomen is soft.     Tenderness: There is no abdominal tenderness.   Musculoskeletal:        General: No swelling.     Cervical back: Neck supple.     Comments: +2 due to sounds pedis pulses bilaterally.  Patient is neurovascularly intact  Skin:    General: Skin is warm and dry.     Capillary Refill: Capillary refill takes less than 2 seconds.     Comments: Large lower extremity chronic wounds with purulent discharge noted on exam.  Please reference pictures below.  Neurological:     General: No focal deficit present.     Mental Status: She is alert and oriented to person, place, and time.  Psychiatric:        Mood and Affect: Mood normal.        (all labs ordered are listed, but only abnormal results are displayed) Labs Reviewed  COMPREHENSIVE METABOLIC PANEL WITH GFR - Abnormal; Notable for the following components:      Result Value   Chloride 92 (*)    BUN 34 (*)    Creatinine, Ser 9.20 (*)    Albumin 2.7 (*)    GFR, Estimated 5 (*)    Anion gap 20 (*)    All other components within normal limits  CBC - Abnormal; Notable for the following components:   WBC 14.1 (*)    RBC 2.72 (*)    Hemoglobin 8.6 (*)    HCT 26.9 (*)    RDW 15.9 (*)    All other components within normal limits  I-STAT CG4 LACTIC ACID, ED  I-STAT CG4 LACTIC ACID, ED    EKG: None  Radiology: No results found.   Procedures   Medications Ordered in the ED  ondansetron  (ZOFRAN ) injection 4 mg (4 mg Intravenous Given 09/29/23 0459)  fentaNYL  (SUBLIMAZE ) injection 50 mcg (50 mcg Intravenous Given 09/29/23 0459)  ceFAZolin  (ANCEF ) IVPB 2g/100 mL premix (0 g Intravenous Stopped 09/29/23 0626)  fentaNYL  (SUBLIMAZE ) injection 100 mcg (100 mcg Intravenous Given 09/29/23 0544)                                    Medical Decision Making Amount and/or Complexity of Data Reviewed Labs: ordered.  Risk Prescription drug management.   This patient presents to the ED for concern of lower extremity wounds increased pain differential diagnosis includes cellulitis,  sepsis, abscess, osteomyelitis, necrotizing fasciitis  Additional history obtained   Additional history obtained from Electronic Medical Record External records from outside source obtained and reviewed including previous admission documents   Lab Tests:  I Ordered, and personally interpreted labs.  The pertinent results include: Leukocytosis at 14.1, anemia at 8.6 which is chronic per historical values, reduced chloride at 92, elevated creatinine at 9.2, elevated bun of 34,  elevated anion gap at 20, lactic 1.3   Medicines ordered and prescription drug management:  I ordered medication including fentanyl  and Zofran , Ancef     I have reviewed the patients home medicines and have made adjustments as needed   Problem List / ED Course:  EKG with normal sinus rhythm and incomplete left bundle branch block Patient signed out to [place], PA-C pending admission.    Final diagnoses:  Wound infection    ED Discharge Orders     None          [name] 09/29/23 [id number]    Lorette Mayo, MD 09/29/23 [phone]

## 2023-09-29 NOTE — Progress Notes (Signed)
 Pt receives out-pt HD at [facility], MWF, 0700 chair time. Will continue to assist.   Lavanda Fredrickson Dialysis Navigator [phone]

## 2023-09-30 ENCOUNTER — Observation Stay (HOSPITAL_COMMUNITY)

## 2023-09-30 ENCOUNTER — Encounter (HOSPITAL_COMMUNITY): Payer: Self-pay

## 2023-09-30 ENCOUNTER — Other Ambulatory Visit (HOSPITAL_COMMUNITY): Payer: Self-pay

## 2023-09-30 DIAGNOSIS — I10 Essential (primary) hypertension: Secondary | ICD-10-CM | POA: Diagnosis not present

## 2023-09-30 DIAGNOSIS — S81802D Unspecified open wound, left lower leg, subsequent encounter: Secondary | ICD-10-CM

## 2023-09-30 DIAGNOSIS — N186 End stage renal disease: Secondary | ICD-10-CM

## 2023-09-30 DIAGNOSIS — S81801D Unspecified open wound, right lower leg, subsequent encounter: Secondary | ICD-10-CM

## 2023-09-30 DIAGNOSIS — T148XXA Other injury of unspecified body region, initial encounter: Secondary | ICD-10-CM | POA: Diagnosis not present

## 2023-09-30 DIAGNOSIS — Z992 Dependence on renal dialysis: Secondary | ICD-10-CM

## 2023-09-30 DIAGNOSIS — E782 Mixed hyperlipidemia: Secondary | ICD-10-CM

## 2023-09-30 DIAGNOSIS — L97921 Non-pressure chronic ulcer of unspecified part of left lower leg limited to breakdown of skin: Secondary | ICD-10-CM

## 2023-09-30 MED ORDER — CINACALCET HCL 30 MG PO TABS
180.0000 mg | ORAL_TABLET | ORAL | 0 refills | Status: AC
  Filled 2023-09-30: qty 60, 24d supply, fill #0

## 2023-09-30 MED ORDER — HYDROMORPHONE HCL 1 MG/ML IJ SOLN
INTRAMUSCULAR | Status: AC
  Filled 2023-09-30: qty 1

## 2023-09-30 MED ORDER — ANTICOAGULANT SODIUM CITRATE 4% (200MG/5ML) IV SOLN: 5.0000 mL | Status: DC | PRN

## 2023-09-30 MED ORDER — HEPARIN SODIUM (PORCINE) 1000 UNIT/ML DIALYSIS
4000.0000 [IU] | INTRAMUSCULAR | Status: DC | PRN
  Administered 2023-09-30: 4000 [IU] via INTRAVENOUS_CENTRAL
  Filled 2023-09-30: qty 4

## 2023-09-30 MED ORDER — HEPARIN SODIUM (PORCINE) 1000 UNIT/ML IJ SOLN
INTRAMUSCULAR | Status: AC
  Filled 2023-09-30: qty 4

## 2023-09-30 MED ORDER — HYDROMORPHONE HCL 2 MG PO TABS
1.0000 mg | ORAL_TABLET | Freq: Three times a day (TID) | ORAL | 0 refills | Status: AC | PRN
  Filled 2023-09-30: qty 10, 7d supply, fill #0

## 2023-09-30 MED ORDER — LIDOCAINE-PRILOCAINE 2.5-2.5 % EX CREA: 1.0000 | TOPICAL_CREAM | CUTANEOUS | Status: DC | PRN

## 2023-09-30 MED ORDER — LIDOCAINE HCL (PF) 1 % IJ SOLN: 5.0000 mL | INTRAMUSCULAR | Status: DC | PRN

## 2023-09-30 MED ORDER — PENTAFLUOROPROP-TETRAFLUOROETH EX AERO: 1.0000 | INHALATION_SPRAY | CUTANEOUS | Status: DC | PRN

## 2023-09-30 MED ORDER — HEPARIN SODIUM (PORCINE) 1000 UNIT/ML DIALYSIS: 1000.0000 [IU] | INTRAMUSCULAR | Status: DC | PRN

## 2023-09-30 NOTE — Progress Notes (Signed)
  [facility] Progress Note   Subjective:  Seen on HD - 2.5L UFG and tolerating. Ongoing BLE pain. No dyspnea/CP. Wound team evaluated her - s/p bandages, needs outpatient wound care. Ongoing pain control. She is hopeful to discharge later today.  Objective Vitals:   09/30/23 0318 09/30/23 0749 09/30/23 0812 09/30/23 0816  BP: 122/86 120/88 111/77 129/79  Pulse: 75 81 69 72  Resp: 20 20 12 14   Temp: (!) 97.4 F (36.3 C) 97.7 F (36.5 C) 97.6 F (36.4 C)   TempSrc:      SpO2: 100% 100% 96% 100%  Weight:   124.6 kg   Height:       Physical Exam General: Well appearing, NAD. Room air Heart: RRR Lungs: CTA anteriorly Abdomen: soft Extremities: BLE wrapped (did not examine), 2+ thigh pitting edema Dialysis Access: LUE AVF +t/b  Additional Objective Labs: Basic Metabolic Panel: Recent Labs  Lab 09/29/23 0310  NA 135  K 3.8  CL 92*  CO2 23  GLUCOSE 95  BUN 34*  CREATININE 9.20*  CALCIUM  9.7   Liver Function Tests: Recent Labs  Lab 09/29/23 0310  AST 16  ALT 13  ALKPHOS 48  BILITOT 1.1  PROT 7.6  ALBUMIN 2.7*   CBC: Recent Labs  Lab 09/29/23 0310  WBC 14.1*  HGB 8.6*  HCT 26.9*  MCV 98.9  PLT 239   Medications:  anticoagulant sodium citrate      sodium thiosulfate  25 g in sodium chloride  0.9 % 200 mL Infusion for Calciphylaxis      acetaminophen   1,000 mg Oral TID   Chlorhexidine  Gluconate Cloth  6 each Topical Q0600   cinacalcet   180 mg Oral Q M,W,F-1800   fluticasone  furoate-vilanterol  1 puff Inhalation Daily   gabapentin   100 mg Oral TID   heparin   5,000 Units Subcutaneous Q8H   methocarbamol   500 mg Oral TID   pantoprazole   40 mg Oral Daily   polyethylene glycol  17 g Oral BID   sevelamer  carbonate  2,400 mg Oral TID WC    [facility] MWF 4:00 450/800 EDW 122.7kg 2K/2Ca AVF  -Heparin  4800 units + 4000 units intermittent -Mircera 100 q 2 wks (last 9/22) -Cinacalcet  180 q HD -Na thiosulfate 25g q HD     Assessment/Plan: Bilateral LE wounds/calciphylaxis. Wound care and pain meds per primary team. Will continue sodium thiosulfate  with HD.  ESRD.  HD MWF.  On HD now - 2.5L UFG and tolerating.  Hypertension/volume. BP controlled.  +  volume on exam.  Anemia. Hgb 8s. ESA recently dosed as outpatient. Follow trends.  Metabolic bone disease.  Continue cinacalcet  and sevelamer . Last PTH and Phos still high. Keep titrating binders as outpatient. Consider PTX if labs not improving (last OP labs 9/19 - Ca 10.2, PTH 1221, Phos 5.7). Dispo: OK for discharge from renal standpoint - will need outpatient wound care and pain clinic likely.   Izetta Boehringer, PA-C 09/30/2023, 8:45 AM  [employer]

## 2023-09-30 NOTE — Progress Notes (Signed)
 ABI exams have been completed.   Results can be found under chart review under CV PROC. 09/30/2023 4:59 PM Kaymarie Wynn RVT, RDMS

## 2023-09-30 NOTE — Discharge Planning (Signed)
 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     HD ORDER CHANGES: Heparin  change: no EDW Change: no Bath Change: no  ANEMIA MANAGEMENT: Aranesp : Given: no   ESA dose for discharge: Same as prior IV Iron  dose at discharge: Same as prior, per protocol Transfusion: Given: no  BONE/MINERAL MEDICATIONS: Hectorol /Calcitriol  change: no - remain OFF this Sensipar /Parsabiv change: no  ACCESS INTERVENTION/CHANGE: no Details:   RECENT LABS: Recent Labs  Lab 09/29/23 0310  HGB 8.6*  NA 135  K 3.8  CALCIUM  9.7  ALBUMIN 2.7*    IV ANTIBIOTICS: no Details:  OTHER ANTICOAGULATION: no Details:  OTHER/APPTS/LAB ORDERS: - She reports that she cannot get appt with pain clinic - pls assist with this - She does have wound care appt scheduled for 10/18/23 - Pls disc with dietician - may need to change to DAILY sensipar  dosing. If PTH remains high with this, will need to consider parathyroidectomy  D/C Meds to be reconciled by nurse after every discharge.  Completed By: Izetta Boehringer, PA-C [facility] Pager [phone]   Reviewed by: MD:______ RN_______

## 2023-09-30 NOTE — Progress Notes (Signed)
 PT Cancellation Note  Patient Details Name: [name] MRN: [id number] DOB: [birth year]   Cancelled Treatment:    Reason Eval/Treat Not Completed: Patient at procedure or test/unavailable; back from HD then headed down for vascular study.  Will follow up another day.   [name] 09/30/2023, 3:50 PM [name], PT Acute Rehabilitation Services Office:[phone] 09/30/2023

## 2023-09-30 NOTE — Progress Notes (Signed)
 PT Cancellation Note  Patient Details Name: [name] MRN: [id number] DOB: 1975-01-14   Cancelled Treatment:    Reason Eval/Treat Not Completed: Patient at procedure or test/unavailable; in HD, will follow up post procedure.   [name] 09/30/2023, 9:22 AM [name], PT Acute Rehabilitation Services Office:[phone] 09/30/2023
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
## 2023-09-30 NOTE — Plan of Care (Signed)

## 2023-09-30 NOTE — Procedures (Signed)
 Patient seen on Hemodialysis. BP 128/75   Pulse 79   Temp 97.6 F (36.4 C)   Resp 15   Ht 5' 8 (1.727 m)   Wt 124.6 kg   SpO2 100%   BMI 41.77 kg/m   QB 400, UF goal 2.5L Tolerating treatment without complaints at this time.   Gordy Blanch MD [facility]. Office # [phone] Pager # [phone] 10:02 AM

## 2023-10-01 ENCOUNTER — Telehealth (HOSPITAL_COMMUNITY): Payer: Self-pay | Admitting: Nephrology

## 2023-10-01 NOTE — Telephone Encounter (Signed)
 Transition of Care - Initial Contact after Hospitalization  Date of discharge: 09/30/2023 Date of contact: 10/01/23  Method: Phone Spoke to: Patient  Patient contacted to discuss transition of care from recent inpatient hospitalization. Patient was admitted to [facility] from 9/25-9/26/25 with BLE calciphylaxis pain.  The discharge medication list was reviewed. Patient understands the changes and has no concerns.   Patient will return to his/her outpatient HD unit on: Monday  No other concerns at this time. We did discuss arranging outpatient pain clinic.  Izetta Boehringer, PA-C [employer] Pager [phone]

## 2023-10-03 NOTE — Progress Notes (Signed)
 Late note entry 9/29- 0909am D/c over weekend noted, contacted out-pt HD unit to inform of d/c and that pt should have returned this morning. No further support needed.    [name] D[facility] [phone]

## 2023-10-08 ENCOUNTER — Other Ambulatory Visit: Payer: Self-pay

## 2023-10-08 ENCOUNTER — Encounter (HOSPITAL_COMMUNITY): Payer: Self-pay | Admitting: *Deleted

## 2023-10-08 ENCOUNTER — Emergency Department (HOSPITAL_COMMUNITY)
Admission: EM | Admit: 2023-10-08 | Discharge: 2023-10-08 | Disposition: A | Discharge status: Home / Self Care | Attending: Emergency Medicine | Admitting: Emergency Medicine

## 2023-10-08 DIAGNOSIS — L97909 Non-pressure chronic ulcer of unspecified part of unspecified lower leg with unspecified severity: Secondary | ICD-10-CM

## 2023-10-08 DIAGNOSIS — M79604 Pain in right leg: Secondary | ICD-10-CM | POA: Diagnosis present

## 2023-10-08 DIAGNOSIS — L97919 Non-pressure chronic ulcer of unspecified part of right lower leg with unspecified severity: Secondary | ICD-10-CM | POA: Insufficient documentation

## 2023-10-08 DIAGNOSIS — Z992 Dependence on renal dialysis: Secondary | ICD-10-CM | POA: Insufficient documentation

## 2023-10-08 DIAGNOSIS — I509 Heart failure, unspecified: Secondary | ICD-10-CM | POA: Diagnosis not present

## 2023-10-08 DIAGNOSIS — D631 Anemia in chronic kidney disease: Secondary | ICD-10-CM | POA: Insufficient documentation

## 2023-10-08 DIAGNOSIS — N186 End stage renal disease: Secondary | ICD-10-CM | POA: Diagnosis not present

## 2023-10-08 DIAGNOSIS — I132 Hypertensive heart and chronic kidney disease with heart failure and with stage 5 chronic kidney disease, or end stage renal disease: Secondary | ICD-10-CM | POA: Diagnosis not present

## 2023-10-08 DIAGNOSIS — L97929 Non-pressure chronic ulcer of unspecified part of left lower leg with unspecified severity: Secondary | ICD-10-CM | POA: Insufficient documentation

## 2023-10-08 LAB — BASIC METABOLIC PANEL WITH GFR
Anion gap: 22 — ABNORMAL HIGH (ref 5–15)
BUN: 49 mg/dL — ABNORMAL HIGH (ref 6–20)
CO2: 23 mmol/L (ref 22–32)
Calcium: 9.8 mg/dL (ref 8.9–10.3)
Chloride: 92 mmol/L — ABNORMAL LOW (ref 98–111)
Creatinine, Ser: 12.36 mg/dL — ABNORMAL HIGH (ref 0.44–1.00)
GFR, Estimated: 3 mL/min — ABNORMAL LOW (ref >60)
Glucose, Bld: 93 mg/dL (ref 70–99)
Potassium: 4.5 mmol/L (ref 3.5–5.1)
Sodium: 137 mmol/L (ref 135–145)

## 2023-10-08 LAB — CBC
HCT: 29.5 % — ABNORMAL LOW (ref 36.0–46.0)
Hemoglobin: 9.2 g/dL — ABNORMAL LOW (ref 12.0–15.0)
MCH: 30.9 pg (ref 26.0–34.0)
MCHC: 31.2 g/dL (ref 30.0–36.0)
MCV: 99 fL (ref 80.0–100.0)
Platelets: 206 K/uL (ref 150–400)
RBC: 2.98 MIL/uL — ABNORMAL LOW (ref 3.87–5.11)
RDW: 15.2 % (ref 11.5–15.5)
WBC: 10.1 K/uL (ref 4.0–10.5)
nRBC: 0 % (ref 0.0–0.2)

## 2023-10-08 MED ORDER — HYDROMORPHONE HCL 1 MG/ML IJ SOLN
1.0000 mg | Freq: Once | INTRAMUSCULAR | Status: AC
  Administered 2023-10-08: 1 mg via INTRAMUSCULAR
  Filled 2023-10-08: qty 1

## 2023-10-08 MED ORDER — HYDROMORPHONE HCL 1 MG/ML IJ SOLN
1.0000 mg | Freq: Once | INTRAMUSCULAR | Status: AC
  Administered 2023-10-08: 1 mg via INTRAVENOUS
  Filled 2023-10-08: qty 1

## 2023-10-08 NOTE — Discharge Instructions (Addendum)
 Follow-up with your dialysis center as planned.  Continue to follow-up with the wound care center and the pain referral that you have.

## 2023-10-08 NOTE — ED Triage Notes (Signed)
 The pt has had pain since July  she is a dialysis pt  she was there Friday  she has been taking percocet 10mg  fistula lt arm

## 2023-10-08 NOTE — ED Provider Notes (Signed)
 [place] EMERGENCY DEPARTMENT AT [facility] Provider Note   CSN: [id number] Arrival date & time: 10/08/23  1545     Patient presents with: Leg Pain   [name] is a 48 y.o. female.    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  Patient states she has been having pain in her legs since July.  Patient was admitted to the hospital recently for this.  Patient was admitted on September 25.  Per the notes patient has been diagnosed with calciphylaxis and she has open wounds of both legs.  Patient was given narcotic pain medications.  Patient states she has been taking medications but the pain is uncontrolled.  She denies any fevers  Prior to Admission medications   Medication Sig Start Date End Date Taking? Authorizing Provider  acetaminophen  (TYLENOL ) 500 MG tablet Take 2 tablets (1,000 mg total) by mouth every 6 (six) hours as needed for mild pain (pain score 1-3) or fever. 09/16/23   Vicci Burnard SAUNDERS, PA-C  albuterol  (VENTOLIN  HFA) 108 (90 Base) MCG/ACT inhaler Inhale 1-2 puffs into the lungs every 6 (six) hours as needed for wheezing or shortness of breath. 01/01/23   Crain, Whitney L, PA  bacitracin  ointment Apply 1 Application topically 2 (two) times daily. Patient taking differently: Apply 1 Application topically daily as needed for wound care. 07/17/23   Simon Lavonia SAILOR, MD  cinacalcet  (SENSIPAR ) 30 MG tablet Take 6 tablets (180 mg total) by mouth every Monday, Wednesday, and Friday at 6 PM. 09/30/23   Darci Pore, MD  docusate sodium  (COLACE) 100 MG capsule Take 1 capsule (100 mg total) by mouth daily as needed for mild constipation. 09/16/23   Vicci Burnard SAUNDERS, PA-C  gabapentin  (NEURONTIN ) 100 MG capsule Take 200 mg by mouth at bedtime.    [provider]  HYDROmorphone  (DILAUDID ) 2 MG tablet Take 0.5 tablets (1 mg total) by  mouth every 8 (eight) hours as needed for up to 10 days for severe pain (pain score 7-10). 09/30/23 10/10/23  Darci Pore, MD  megestrol  (MEGACE ) 20 MG tablet Take 1 tablet (20 mg total) by mouth daily. 11/02/22   Ajewole, Christana, MD  methocarbamol  (ROBAXIN ) 500 MG tablet Take 1 tablet (500 mg total) by mouth every 8 (eight) hours as needed for muscle spasms. 09/16/23   Vicci Burnard SAUNDERS, PA-C  pantoprazole  (PROTONIX ) 40 MG tablet Take 1 tablet (40 mg total) by mouth daily. 01/12/22   Mansouraty, Gabriel Jr., MD  polyethylene glycol powder (GLYCOLAX /MIRALAX ) 17 GM/SCOOP powder Dissolve 1 capful (17g) in 4-8 ounces of liquid and take by mouth daily 2 (two) times daily. 09/16/23   Vicci Burnard SAUNDERS, PA-C  sevelamer  carbonate (RENVELA ) 800 MG tablet TAKE 3 TABLETS BY MOUTH BEFORE MEALS THREE TIMES DAILY AND 3 TABLETS BEFORE SNACKS TWICE A DAY 08/10/23   [provider]  SYMBICORT 80-4.5 MCG/ACT inhaler Inhale 2 puffs into the lungs daily. 07/28/23   [provider]    Allergies: Pollen extract and Oxycodone -acetaminophen     Review of Systems  Updated Vital Signs BP (!) 152/86 (BP Location: Right Arm)   Pulse 63   Temp 97.9 F (36.6 C) (Oral)   Resp 16   Ht 1.727 m (5' 8)   Wt 125.8 kg   SpO2 100%   BMI 42.17 kg/m   Physical Exam Vitals and nursing note reviewed.  Constitutional:  General: She is not in acute distress.    Appearance: She is well-developed.  HENT:     Head: Normocephalic and atraumatic.     Right Ear: External ear normal.     Left Ear: External ear normal.  Eyes:     General: No scleral icterus.       Right eye: No discharge.        Left eye: No discharge.     Conjunctiva/sclera: Conjunctivae normal.  Neck:     Trachea: No tracheal deviation.  Cardiovascular:     Rate and Rhythm: Normal rate.  Pulmonary:     Effort: Pulmonary effort is normal. No respiratory distress.     Breath sounds: No stridor.  Abdominal:     General: There is  no distension.  Musculoskeletal:        General: No swelling or deformity.     Cervical back: Neck supple.  Skin:    General: Skin is warm and dry.     Findings: No rash.  Neurological:     Mental Status: She is alert. Mental status is at baseline.     Cranial Nerves: No dysarthria or facial asymmetry.     Motor: No seizure activity.        (all labs ordered are listed, but only abnormal results are displayed) Labs Reviewed  CBC - Abnormal; Notable for the following components:      Result Value   RBC 2.98 (*)    Hemoglobin 9.2 (*)    HCT 29.5 (*)    All other components within normal limits  BASIC METABOLIC PANEL WITH GFR - Abnormal; Notable for the following components:   Chloride 92 (*)    BUN 49 (*)    Creatinine, Ser 12.36 (*)    GFR, Estimated 3 (*)    Anion gap 22 (*)    All other components within normal limits    EKG: None  Radiology: No results found.   Procedures   Medications Ordered in the ED  HYDROmorphone  (DILAUDID ) injection 1 mg (has no administration in time range)  HYDROmorphone  (DILAUDID ) injection 1 mg (1 mg Intramuscular Given 10/08/23 1741)    Clinical Course as of 10/08/23 2054  Sat Oct 08, 2023  1954 CBC(!) CBC shows stable anemia [JK]  1954 Basic metabolic panel(!) Metabolic panel consistent with her chronic kidney disease [JK]  2050 Patient's pain has improved with treatment.  Discussed options of admission for pain control versus outpatient management.  Patient states she wants to go home.  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): chronic illness or injury with exacerbation, progression, or side effects of treatment  Amount and/or Complexity of Data Reviewed Labs: ordered. Decision-making details documented in ED  Course.  Risk Prescription drug management.   Patient presented ED with complaints of persistent leg pain and nonhealing ulcers in her legs.  Patient has been diagnosed with calciphylaxis.  She unfortunately has nonhealing ulcerative wounds of her lower extremity.  Patient's laboratory test today did not show any signs of leukocytosis.  No significant electrolyte abnormalities.  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  Patient was treated with IV pain medication  with some relief.  Patient does have oxycodone  that she can continue to take for pain.  She does have plans to follow-up with pain management.  Offered admission for pain control.  Patient states she would like to go home.  Requests her wounds to be dressed.  I do think she would benefit from outpatient follow-up at the [facility] health wound care center.     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

## 2023-10-08 NOTE — ED Notes (Signed)
 Patient verbalizes understanding of discharge instructions. Opportunity for questioning and answers were provided. Armband removed by staff, pt discharged from ED. Wheeled out to lobby

## 2023-10-12 ENCOUNTER — Encounter (HOSPITAL_BASED_OUTPATIENT_CLINIC_OR_DEPARTMENT_OTHER): Attending: General Surgery | Admitting: General Surgery

## 2023-10-12 DIAGNOSIS — L97812 Non-pressure chronic ulcer of other part of right lower leg with fat layer exposed: Secondary | ICD-10-CM | POA: Insufficient documentation

## 2023-10-12 DIAGNOSIS — L97822 Non-pressure chronic ulcer of other part of left lower leg with fat layer exposed: Secondary | ICD-10-CM | POA: Insufficient documentation

## 2023-10-12 DIAGNOSIS — N2581 Secondary hyperparathyroidism of renal origin: Secondary | ICD-10-CM | POA: Insufficient documentation

## 2023-10-12 DIAGNOSIS — Z992 Dependence on renal dialysis: Secondary | ICD-10-CM | POA: Insufficient documentation

## 2023-10-12 DIAGNOSIS — N186 End stage renal disease: Secondary | ICD-10-CM | POA: Insufficient documentation

## 2023-10-13 ENCOUNTER — Other Ambulatory Visit: Admitting: Psychology

## 2023-10-14 ENCOUNTER — Emergency Department (HOSPITAL_COMMUNITY)

## 2023-10-14 ENCOUNTER — Inpatient Hospital Stay (HOSPITAL_COMMUNITY)
Admission: EM | Admit: 2023-10-14 | Discharge: 2023-10-21 | DRG: 535 | Disposition: A | Discharge status: Skilled Nursing Facility | Attending: Internal Medicine | Admitting: Internal Medicine

## 2023-10-14 DIAGNOSIS — S3210XA Unspecified fracture of sacrum, initial encounter for closed fracture: Secondary | ICD-10-CM | POA: Diagnosis present

## 2023-10-14 DIAGNOSIS — I5042 Chronic combined systolic (congestive) and diastolic (congestive) heart failure: Secondary | ICD-10-CM | POA: Diagnosis present

## 2023-10-14 DIAGNOSIS — S32591A Other specified fracture of right pubis, initial encounter for closed fracture: Secondary | ICD-10-CM | POA: Diagnosis not present

## 2023-10-14 DIAGNOSIS — E66813 Obesity, class 3: Secondary | ICD-10-CM | POA: Diagnosis present

## 2023-10-14 DIAGNOSIS — E785 Hyperlipidemia, unspecified: Secondary | ICD-10-CM | POA: Diagnosis present

## 2023-10-14 DIAGNOSIS — Z885 Allergy status to narcotic agent status: Secondary | ICD-10-CM

## 2023-10-14 DIAGNOSIS — Z833 Family history of diabetes mellitus: Secondary | ICD-10-CM

## 2023-10-14 DIAGNOSIS — S32119A Unspecified Zone I fracture of sacrum, initial encounter for closed fracture: Secondary | ICD-10-CM | POA: Diagnosis present

## 2023-10-14 DIAGNOSIS — N186 End stage renal disease: Secondary | ICD-10-CM | POA: Diagnosis present

## 2023-10-14 DIAGNOSIS — L97929 Non-pressure chronic ulcer of unspecified part of left lower leg with unspecified severity: Secondary | ICD-10-CM | POA: Diagnosis present

## 2023-10-14 DIAGNOSIS — Z83438 Family history of other disorder of lipoprotein metabolism and other lipidemia: Secondary | ICD-10-CM

## 2023-10-14 DIAGNOSIS — D631 Anemia in chronic kidney disease: Secondary | ICD-10-CM | POA: Diagnosis present

## 2023-10-14 DIAGNOSIS — J45909 Unspecified asthma, uncomplicated: Secondary | ICD-10-CM | POA: Diagnosis present

## 2023-10-14 DIAGNOSIS — K8020 Calculus of gallbladder without cholecystitis without obstruction: Secondary | ICD-10-CM | POA: Diagnosis present

## 2023-10-14 DIAGNOSIS — D72828 Other elevated white blood cell count: Secondary | ICD-10-CM | POA: Diagnosis present

## 2023-10-14 DIAGNOSIS — Z79899 Other long term (current) drug therapy: Secondary | ICD-10-CM

## 2023-10-14 DIAGNOSIS — I420 Dilated cardiomyopathy: Secondary | ICD-10-CM | POA: Diagnosis present

## 2023-10-14 DIAGNOSIS — W101XXA Fall (on)(from) sidewalk curb, initial encounter: Secondary | ICD-10-CM | POA: Diagnosis present

## 2023-10-14 DIAGNOSIS — Z8419 Family history of other disorders of kidney and ureter: Secondary | ICD-10-CM

## 2023-10-14 DIAGNOSIS — I2510 Atherosclerotic heart disease of native coronary artery without angina pectoris: Secondary | ICD-10-CM | POA: Diagnosis present

## 2023-10-14 DIAGNOSIS — N250 Renal osteodystrophy: Secondary | ICD-10-CM | POA: Diagnosis present

## 2023-10-14 DIAGNOSIS — Z992 Dependence on renal dialysis: Secondary | ICD-10-CM

## 2023-10-14 DIAGNOSIS — I132 Hypertensive heart and chronic kidney disease with heart failure and with stage 5 chronic kidney disease, or end stage renal disease: Secondary | ICD-10-CM | POA: Diagnosis present

## 2023-10-14 DIAGNOSIS — Z6841 Body Mass Index (BMI) 40.0 and over, adult: Secondary | ICD-10-CM

## 2023-10-14 DIAGNOSIS — Z9109 Other allergy status, other than to drugs and biological substances: Secondary | ICD-10-CM

## 2023-10-14 DIAGNOSIS — S32592A Other specified fracture of left pubis, initial encounter for closed fracture: Secondary | ICD-10-CM | POA: Diagnosis not present

## 2023-10-14 DIAGNOSIS — Z751 Person awaiting admission to adequate facility elsewhere: Secondary | ICD-10-CM

## 2023-10-14 DIAGNOSIS — Z8701 Personal history of pneumonia (recurrent): Secondary | ICD-10-CM

## 2023-10-14 DIAGNOSIS — I700 Atherosclerosis of aorta: Secondary | ICD-10-CM | POA: Diagnosis present

## 2023-10-14 DIAGNOSIS — Z8249 Family history of ischemic heart disease and other diseases of the circulatory system: Secondary | ICD-10-CM

## 2023-10-14 DIAGNOSIS — L97919 Non-pressure chronic ulcer of unspecified part of right lower leg with unspecified severity: Secondary | ICD-10-CM | POA: Diagnosis present

## 2023-10-14 DIAGNOSIS — Z7951 Long term (current) use of inhaled steroids: Secondary | ICD-10-CM

## 2023-10-14 DIAGNOSIS — E8889 Other specified metabolic disorders: Secondary | ICD-10-CM | POA: Diagnosis present

## 2023-10-14 DIAGNOSIS — N200 Calculus of kidney: Secondary | ICD-10-CM | POA: Diagnosis present

## 2023-10-14 LAB — CBC
HCT: 30.1 % — ABNORMAL LOW (ref 36.0–46.0)
Hemoglobin: 9.3 g/dL — ABNORMAL LOW (ref 12.0–15.0)
MCH: 30.7 pg (ref 26.0–34.0)
MCHC: 30.9 g/dL (ref 30.0–36.0)
MCV: 99.3 fL (ref 80.0–100.0)
Platelets: 290 K/uL (ref 150–400)
RBC: 3.03 MIL/uL — ABNORMAL LOW (ref 3.87–5.11)
RDW: 15.5 % (ref 11.5–15.5)
WBC: 19.1 K/uL — ABNORMAL HIGH (ref 4.0–10.5)
nRBC: 0 % (ref 0.0–0.2)

## 2023-10-14 LAB — BASIC METABOLIC PANEL WITH GFR
Anion gap: 26 — ABNORMAL HIGH (ref 5–15)
BUN: 32 mg/dL — ABNORMAL HIGH (ref 6–20)
CO2: 24 mmol/L (ref 22–32)
Calcium: 10.7 mg/dL — ABNORMAL HIGH (ref 8.9–10.3)
Chloride: 89 mmol/L — ABNORMAL LOW (ref 98–111)
Creatinine, Ser: 9.01 mg/dL — ABNORMAL HIGH (ref 0.44–1.00)
GFR, Estimated: 5 mL/min — ABNORMAL LOW (ref >60)
Glucose, Bld: 80 mg/dL (ref 70–99)
Potassium: 3.7 mmol/L (ref 3.5–5.1)
Sodium: 139 mmol/L (ref 135–145)

## 2023-10-14 MED ORDER — FENTANYL CITRATE (PF) 50 MCG/ML IJ SOSY
50.0000 ug | PREFILLED_SYRINGE | Freq: Once | INTRAMUSCULAR | Status: AC
  Administered 2023-10-14: 50 ug via INTRAVENOUS
  Filled 2023-10-14 (×2): qty 1

## 2023-10-14 MED ORDER — METHOCARBAMOL 500 MG PO TABS: 500.0000 mg | ORAL_TABLET | Freq: Three times a day (TID) | ORAL | Status: DC | PRN

## 2023-10-14 MED ORDER — ALBUTEROL SULFATE (2.5 MG/3ML) 0.083% IN NEBU: 3.0000 mL | INHALATION_SOLUTION | Freq: Four times a day (QID) | RESPIRATORY_TRACT | Status: DC | PRN

## 2023-10-14 MED ORDER — ONDANSETRON HCL 4 MG/2ML IJ SOLN
4.0000 mg | Freq: Four times a day (QID) | INTRAMUSCULAR | Status: DC | PRN
  Filled 2023-10-14: qty 2

## 2023-10-14 MED ORDER — ACETAMINOPHEN 650 MG RE SUPP: 650.0000 mg | Freq: Four times a day (QID) | RECTAL | Status: DC | PRN

## 2023-10-14 MED ORDER — FENTANYL CITRATE (PF) 50 MCG/ML IJ SOSY
25.0000 ug | PREFILLED_SYRINGE | INTRAMUSCULAR | Status: DC | PRN
  Administered 2023-10-14: 25 ug via INTRAVENOUS
  Filled 2023-10-14: qty 1

## 2023-10-14 MED ORDER — CADEXOMER IODINE 0.9 % EX GEL
CUTANEOUS | Status: DC
  Filled 2023-10-14 (×3): qty 40

## 2023-10-14 MED ORDER — FENTANYL CITRATE (PF) 50 MCG/ML IJ SOSY
50.0000 ug | PREFILLED_SYRINGE | Freq: Once | INTRAMUSCULAR | Status: AC
  Administered 2023-10-14: 50 ug via INTRAVENOUS
  Filled 2023-10-14: qty 1

## 2023-10-14 MED ORDER — GABAPENTIN 100 MG PO CAPS
200.0000 mg | ORAL_CAPSULE | Freq: Every day | ORAL | Status: DC
  Administered 2023-10-15 – 2023-10-21 (×7): 200 mg via ORAL
  Filled 2023-10-14 (×8): qty 2

## 2023-10-14 MED ORDER — ACETAMINOPHEN 325 MG PO TABS: 650.0000 mg | ORAL_TABLET | Freq: Four times a day (QID) | ORAL | Status: DC | PRN

## 2023-10-14 MED ORDER — HYDROMORPHONE HCL 1 MG/ML IJ SOLN
0.5000 mg | INTRAMUSCULAR | Status: DC | PRN
  Administered 2023-10-14 – 2023-10-19 (×15): 1 mg via INTRAVENOUS
  Filled 2023-10-14 (×13): qty 1

## 2023-10-14 MED ORDER — CYCLOBENZAPRINE HCL 10 MG PO TABS
5.0000 mg | ORAL_TABLET | Freq: Once | ORAL | Status: AC
  Administered 2023-10-14: 5 mg via ORAL
  Filled 2023-10-14: qty 1

## 2023-10-14 MED ORDER — CINACALCET HCL 30 MG PO TABS
180.0000 mg | ORAL_TABLET | ORAL | Status: AC
Start: 2023-10-17 — End: ?
  Administered 2023-10-21: 180 mg via ORAL
  Filled 2023-10-14 (×2): qty 6

## 2023-10-14 MED ORDER — BISACODYL 5 MG PO TBEC: 5.0000 mg | DELAYED_RELEASE_TABLET | Freq: Every day | ORAL | Status: DC | PRN

## 2023-10-14 MED ORDER — SENNOSIDES-DOCUSATE SODIUM 8.6-50 MG PO TABS
1.0000 | ORAL_TABLET | Freq: Every evening | ORAL | Status: DC | PRN
Start: 2023-10-14 — End: 2023-10-16

## 2023-10-14 MED ORDER — HYDROMORPHONE HCL 1 MG/ML IJ SOLN
0.5000 mg | Freq: Once | INTRAMUSCULAR | Status: AC
  Administered 2023-10-14: 0.5 mg via INTRAVENOUS
  Filled 2023-10-14: qty 1

## 2023-10-14 MED ORDER — LIDOCAINE 5 % EX PTCH
1.0000 | MEDICATED_PATCH | Freq: Once | CUTANEOUS | Status: AC
  Administered 2023-10-14: 1 via TRANSDERMAL
  Filled 2023-10-14: qty 1

## 2023-10-14 MED ORDER — FLUTICASONE FUROATE-VILANTEROL 100-25 MCG/ACT IN AEPB
1.0000 | INHALATION_SPRAY | Freq: Every day | RESPIRATORY_TRACT | Status: DC
  Administered 2023-10-15 – 2023-10-20 (×6): 1 via RESPIRATORY_TRACT
  Filled 2023-10-14: qty 28

## 2023-10-14 MED ORDER — SEVELAMER CARBONATE 800 MG PO TABS
2400.0000 mg | ORAL_TABLET | Freq: Three times a day (TID) | ORAL | Status: DC
  Administered 2023-10-15 – 2023-10-21 (×12): 2400 mg via ORAL
  Filled 2023-10-14 (×13): qty 3

## 2023-10-14 MED ORDER — DIPHENHYDRAMINE HCL 25 MG PO CAPS
25.0000 mg | ORAL_CAPSULE | Freq: Four times a day (QID) | ORAL | Status: DC | PRN
  Administered 2023-10-17: 25 mg via ORAL
  Filled 2023-10-14: qty 1

## 2023-10-14 MED ORDER — ONDANSETRON HCL 4 MG PO TABS
4.0000 mg | ORAL_TABLET | Freq: Four times a day (QID) | ORAL | Status: DC | PRN
  Administered 2023-10-21: 4 mg via ORAL
  Filled 2023-10-14: qty 1

## 2023-10-14 MED ORDER — DIPHENHYDRAMINE HCL 50 MG/ML IJ SOLN: 12.5000 mg | Freq: Four times a day (QID) | INTRAMUSCULAR | Status: DC | PRN

## 2023-10-14 MED ORDER — HEPARIN SODIUM (PORCINE) 5000 UNIT/ML IJ SOLN
5000.0000 [IU] | Freq: Three times a day (TID) | INTRAMUSCULAR | Status: DC
  Administered 2023-10-15 – 2023-10-21 (×14): 5000 [IU] via SUBCUTANEOUS
  Filled 2023-10-14 (×16): qty 1

## 2023-10-14 MED ORDER — HYDROCODONE-ACETAMINOPHEN 5-325 MG PO TABS
1.0000 | ORAL_TABLET | ORAL | Status: DC | PRN
  Administered 2023-10-14: 1 via ORAL
  Administered 2023-10-15 – 2023-10-16 (×5): 2 via ORAL
  Filled 2023-10-14 (×3): qty 2
  Filled 2023-10-14: qty 1
  Filled 2023-10-14: qty 2

## 2023-10-14 MED ORDER — PANTOPRAZOLE SODIUM 40 MG PO TBEC
40.0000 mg | DELAYED_RELEASE_TABLET | Freq: Every day | ORAL | Status: DC
  Administered 2023-10-15 – 2023-10-20 (×5): 40 mg via ORAL
  Filled 2023-10-14 (×5): qty 1

## 2023-10-14 NOTE — Care Plan (Signed)
 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 Mobilize with PT and obtain post mobilization weightbearing pelvis AP inlet and outlet films  -follow up as outpatient with me in 1-2 wk from discharge   Lillia Mountain, MD Orthopaedic Surgery [facility]
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
## 2023-10-14 NOTE — Consult Note (Signed)
 WOC Nurse Consult Note: patient known to [facility] team from previous admission with calciphylaxis wounds to B lower legs; has been seen at wound care center 10/12/2023; that note is reviewed including images; WCC utilizing Cadexomer Iodine   Reason for Consult: B lower leg wounds  Wound type: full thickness r/t calciphylaxis  Pressure Injury POA:NA not pressure  Measurement: per [facility] 10/8 L medial lower leg 10.5 cm x 15.4 cm x 0.2 cm; R posterior leg 25.2 cm x 13.4 cm x 0.2 cm  Wound bed: l leg 50% eschar 30% tan 20% red; R leg 50% tan 50% eschar  Drainage (amount, consistency, odor) foul smelling purulent per WCC note  Periwound: Dressing procedure/placement/frequency: Cleanse B lower leg wounds with Vashe wound cleanser, do not rinse and allow to air dry.  Apply Cadexomer Iodine  to wound beds every other day, cover with ABD pads and secure with Kerlix roll gauze beginning right above toes and ending right below knees.  Apply Ace bandage in same fashion as Kerlix for light compression.   Patient should continue to follow with wound care center for ongoing management of calciphylaxis wounds.   WOC team will not follow. Re-consult if further needs arise.   Thank you,    Powell Bar MSN, RN-BC, [employer]

## 2023-10-14 NOTE — ED Triage Notes (Signed)
 Clemens backwards when trying to step up on the curb headed into the doctor's office. Back pain immediately after.  Neg HI, LOC, or thinners.

## 2023-10-14 NOTE — Hospital Course (Addendum)
 [name] is a 48 y.o. 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 Patient was found to have left sacral alar fracture along with bilateral superior and inferior pubic rami fracture. Patient was unable to ambulate. She was hospitalized for further management.   Subjective: Seen and examined today Overnight afebrile BP stable on room air.  Labs reviewed from 10/12- stable with chronic anemia On the bedside chair, waiting for dialysis Reports she worked with PT OT, she is not sure, she wants to think about going to skilled nursing facility-bit hesitant to go for 20 days.  Wants to work with PT again  Assessment and plan:  B/L superior and inferior pubic rami fracture left sacral ala fracture Mechanical fall: Per orthopedic, continue nonoperative management, WBAT PT OT pain management and f/u  w/ Dr Barton in 3 wk Optimizing pain management:changed over from Vicodin to Percocet since she was on oxycodone  10 mg PTA, continue muscle relaxant, gabapentin , bowel regimen and prn Dilaudid    ESRD on HD mwf Metabolic bone disease Calciphylaxis: Nephrology is following.  S/p HD 10/13. Cont sevelamer  and Sensipar  with HD no vitamin D .  She is on sodium thiosulfate  dialysis for calciphylaxis along with wound care.   Chronic systolic and diastolic CHF TTE 7975-OCZQ of 25 to 30%, G3DD.Currently euvolemic,Managed with hd Followed by Dr. Ladona with cardiology.   Calciphylaxis with chronic ulcerative wounds to bilateral lower extremities: Continue wound care   Anemia of ESRD Hb stable, continue aranesp , monitor hemoglobin   Leukocytosis Most likely reactive.  She is afebrile.  Trending down    History of asthma Stable.  Morbid Obesity w/ Body mass index is 40.26  kg/m.: Will benefit with PCP follow-up, weight loss,healthy lifestyle and outpatient sleep eval if not done.  Mobility: PT Orders: Active PT Follow up Rec: Home Health Pt10/11/2023 1500   DVT prophylaxis: heparin  injection 5,000 Units Start: 10/14/23 2200 Code Status:   Code Status: Full Code Family Communication: plan of care discussed with patient at bedside. Patient status is: Remains hospitalized because of severity of illness Level of care: Med-Surg   Dispo: The patient is from: home at baseline.            Anticipated disposition: SNF vs HH in 1 day  Objective: Vitals last 24 hrs: Vitals:   10/18/23 0106 10/18/23 0632 10/18/23 0746 10/18/23 0842  BP: 122/65 126/74  128/82  Pulse: 86 89  85  Resp: 20 20  18   Temp: 98.4 F (36.9 C) 98.6 F (37 C)  98.8 F (37.1 C)  TempSrc:      SpO2: 99%  95% 96%  Weight:      Height:        Physical Examination: General exam: aaox3 HEENT:Oral mucosa moist, Ear/Nose WNL grossly Respiratory system: CTA B/L, no use of accessory muscle Cardiovascular system: S1 & S2 +, No JVD. Gastrointestinal system: Abdomen soft,NT,ND, BS+ Nervous System: Alert, awake, moving all extremities,and following commands. Extremities: extremities warm, bilateral leg w/ ace wrap dressing intact Skin: No rashes,no icterus. MSK: Normal muscle bulk,tone, power   Medications reviewed:  Scheduled Meds:  cadexomer iodine    Topical QODAY   Chlorhexidine  Gluconate Cloth  6 each Topical Q0600   cinacalcet   180 mg Oral Q M,W,F-1800   darbepoetin (ARANESP ) injection - DIALYSIS  100 mcg Subcutaneous Q Sun-1800   feeding supplement (NEPRO CARB STEADY)  237 mL Oral BID BM   fluticasone  furoate-vilanterol  1 puff Inhalation Daily   gabapentin   200 mg Oral QHS   heparin   5,000 Units Subcutaneous Q8H   megestrol   20 mg Oral BID   pantoprazole   40 mg Oral Daily   polyethylene glycol  17 g Oral BID   senna-docusate  2 tablet Oral QHS   sevelamer  carbonate  2,400  mg Oral TID WC   Continuous Infusions:  sodium thiosulfate  25 g in sodium chloride  0.9 % 200 mL Infusion for Calciphylaxis 25 g (10/17/23 1700)   Diet: Diet Order             Diet renal with fluid restriction Fluid restriction: 1200 mL Fluid; Room service appropriate? Yes; Fluid consistency: Thin  Diet effective now

## 2023-10-14 NOTE — ED Notes (Signed)
 Attempted IV right FA with no success.  Pt requests IV team as she is restricted to one arm for IV use and reports she always has to have IV team to be successful.

## 2023-10-14 NOTE — H&P (Signed)
 History and Physical    [name] FMW:[id number] DOB: 05-12-1975 DOA: 10/14/2023  PCP: Luvenia Sherry, FNP  Patient coming from: Home  I have personally briefly reviewed patient's old medical records in [facility]  Chief Complaint: Low back pain after a fall  HPI: [name] is a 48 y.o. 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  Patient states she was walking into her doctor's office earlier today when she felt her legs gave out on her and she fell.  She landed on her buttocks and had significant pain immediately after.  She has had difficulty walking.  She did not hit her head or lose consciousness.  Patient states that she did complete her usual dialysis earlier today.  She states that she still makes good amount of urine.  Denies chest pain or dyspnea.  She has chronic ulcerative wounds to her lower extremities and follows with wound care as an outpatient.  ED Course  Labs/Imaging on admission: I have personally reviewed following labs and imaging studies.  Initial vitals showed BP 145/88, pulse 87, RR 18, temp 98.4 F, SpO2 97% on room air.  Labs showed WBC 19.1, hemoglobin 9.3, platelets 290, sodium 139, potassium 3.7, bicarb 24, BUN 32, creatinine 9.01, serum glucose 80.  2 view chest x-ray showed stable cardiomegaly with central pulmonary vascular congestion.  CT L-spine showed left sacral fracture.  CT abdomen/pelvis showed bilateral superior and inferior pubic rami fractures.  Left sacral alla fracture.  Cholelithiasis, bilateral nephrolithiasis, CAD, aortic atherosclerosis noted.  Patient was given IV fentanyl  50 mcg x 2 and Lidoderm  patch.  EDP discussed with orthopedics (Dr. Barton) who  recommended nonoperative management, WBAT with walker and 1-2 person assist, PT eval.  The hospitalist service was consulted for admission.  Review of Systems: All systems reviewed and are negative except as documented in history of present illness above.   Past Medical History:  Diagnosis Date   Anemia    Anxiety    pt not aware of this   Arthritis    Breast mass 09/01/2016   Targeted ultrasound is performed, showing a mass in the left breast at 11 o'clock, 1 cm from the nipple measuring 6.4 by 5.0 cm. The mass is heterogeneous and may contain macroscopic fat.   IMPRESSION: The mass in the left breast is probably benign, possibly a Hamartoma.  RECOMMENDATION: Recommend six-month follow-up mammogram of the probably benign left breast mass. If necessary, an ultrasound co   CHF (congestive heart failure) (HCC)    hosp. 2011   Dilated cardiomyopathy (HCC)    Encounter for blood transfusion    ESRD on hemodialysis (HCC)    [facility] MWF   Family history of adverse reaction to anesthesia      Aunt has a tracheostomy and kept coughing after colonoscopy    Foot fracture    Gout    no flare up for several yrs   Headache    Heart murmur    as a child, no problems as an adult   History of blood transfusion    x 2   History of pneumonia    Hyperlipidemia    Hypertension    Menorrhagia    Morbid obesity (HCC)    Pneumonia    hosp.  2010   Shortness of breath    HX CHL - occasional. 04/09/21 patient sits down and shortness of breath susides.   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   Thyroid disease     Past Surgical History:  Procedure Laterality Date   A/V FISTULAGRAM Left 08/13/2021   Procedure: A/V Fistulagram;  Surgeon: Gretta Lonni PARAS, MD;  Location: [facility] INVASIVE CV LAB;  Service: Cardiovascular;  Laterality: Left;   ANGIOPLASTY  07/28/2015    Procedure: ANGIOPLASTY OF RIGHT BRACHIAL VEIN;  Surgeon: Redell LITTIE Door, MD;  Location: [facility] OR;  Service: Vascular;;   AV FISTULA PLACEMENT  08/04/2011   Procedure: ARTERIOVENOUS (AV) FISTULA CREATION;  Surgeon: Carlin FORBES Haddock, MD;  Location: [facility] OR;  Service: Vascular;  Laterality: Right;  Creation right brachiocephalic arteriovenous fistula   AV FISTULA PLACEMENT Left 02/05/2013   Procedure: ARTERIOVENOUS FISTULA CREATION LEFT ARM;  Surgeon: Redell LITTIE Door, MD;  Location: [facility] OR;  Service: Vascular;  Laterality: Left;   AV FISTULA PLACEMENT Right 06/04/2013   Procedure: ARTERIOVENOUS (AV) FISTULA CREATION;  Surgeon: Redell LITTIE Door, MD;  Location: [facility] OR;  Service: Vascular;  Laterality: Right;   AV FISTULA PLACEMENT Right 04/15/2014   Procedure: INSERTION OF ARTERIOVENOUS (AV) GORE-TEX GRAFT ARM;  Surgeon: Redell LITTIE Door, MD;  Location: [facility] OR;  Service: Vascular;  Laterality: Right;   AV FISTULA PLACEMENT Left 02/25/2021   Procedure: LEFT ARM ARTERIOVENOUS (AV) FISTULA CREATION;  Surgeon: Gretta Lonni PARAS, MD;  Location: [facility] OR;  Service: Vascular;  Laterality: Left;   AV FISTULA PLACEMENT Left 08/26/2021   Procedure: LEFT ARM LIGATION OF BRACHIOBASILIC FISTULA,  REPAIR PSEUDOANEURYSM;  Surgeon: Gretta Lonni PARAS, MD;  Location: [facility] OR;  Service: Vascular;  Laterality: Left;   AV FISTULA PLACEMENT Left 10/12/2021   Procedure: LEFT RADIOCEPHALIC ARTERIOVENOUS FISTULA  ARTERIOVENOUS (AV) FISTULA CREATION WITH INTERPOSITION GRAFT;  Surgeon: Gretta Lonni PARAS, MD;  Location: [facility] OR;  Service: Vascular;  Laterality: Left;   BASCILIC VEIN TRANSPOSITION Right 08/13/2013   Procedure: Second Stage Brachial Vein Transposition;  Surgeon: Redell LITTIE Door, MD;  Location: [facility] OR;  Service: Vascular;  Laterality: Right;   BASCILIC VEIN TRANSPOSITION Left 04/13/2021   Procedure: LEFT ARM SECOND STAGE BASILIC VEIN TRANSPOSITION;  Surgeon: Gretta Lonni PARAS, MD;  Location: [facility] OR;  Service: Vascular;  Laterality: Left;    CARDIAC SURGERY  1979   repair of hole in heart   GRAFT APPLICATION Left 10/12/2021   Procedure: ARTEGRAFT COLLAGEN VASCULAR GRAFT APPLICATION;  Surgeon: Gretta Lonni PARAS, MD;  Location: [facility] OR;  Service: Vascular;  Laterality: Left;   INCISION AND DRAINAGE OF WOUND Right 07/28/2015   Procedure: IRRIGATION AND DEBRIDEMENT RIGHT HAND  WOUND;  Surgeon: Balinda Rogue, MD;  Location: [facility] OR;  Service: Plastics;  Laterality: Right;   LIGATION OF ARTERIOVENOUS  FISTULA Left 03/05/2013   Procedure: LIGATION OF ARTERIOVENOUS  FISTULA;  Surgeon: Redell LITTIE Door, MD;  Location: [facility] OR;  Service: Vascular;  Laterality: Left;   PATCH ANGIOPLASTY Left 03/05/2013   Procedure: PATCH ANGIOPLASTY;  Surgeon: Redell LITTIE Door, MD;  Location: [facility] OR;  Service: Vascular;  Laterality: Left;   PERIPHERAL VASCULAR BALLOON ANGIOPLASTY Left 08/13/2021   Procedure: PERIPHERAL VASCULAR BALLOON ANGIOPLASTY;  Surgeon: Gretta Lonni PARAS, MD;  Location: [facility] INVASIVE CV LAB;  Service: Cardiovascular;  Laterality: Left;  arm fistula  PERIPHERAL VASCULAR CATHETERIZATION Right 10/31/2014   Procedure: Fistulagram;  Surgeon: Redell LITTIE Door, MD;  Location: [facility] INVASIVE CV LAB;  Service: Cardiovascular;  Laterality: Right;   PERIPHERAL VASCULAR CATHETERIZATION Right 10/31/2014   Procedure: Peripheral Vascular Balloon Angioplasty;  Surgeon: Redell LITTIE Door, MD;  Location: [facility] INVASIVE CV LAB;  Service: Cardiovascular;  Laterality: Right;  pta venous rt arm   PERIPHERAL VASCULAR CATHETERIZATION N/A 03/13/2015   Procedure: A/V Shuntogram;  Surgeon: Redell LITTIE Door, MD;  Location: [facility] INVASIVE CV LAB;  Service: Cardiovascular;  Laterality: N/A;   PERIPHERAL VASCULAR CATHETERIZATION Right 03/13/2015   Procedure: Peripheral Vascular Balloon Angioplasty;  Surgeon: Redell LITTIE Door, MD;  Location: [facility] INVASIVE CV LAB;  Service: Cardiovascular;  Laterality: Right;  Shunt   PERIPHERAL VASCULAR CATHETERIZATION Right 01/19/2016   Procedure: A/V Shuntogram;  Surgeon: Lonni GORMAN Blade, MD;  Location: [facility] INVASIVE CV LAB;  Service: Cardiovascular;  Laterality: Right;   REVISION OF ARTERIOVENOUS GORETEX GRAFT Right 10/27/2015   Procedure: THROMBECTOMY AND REVISION OF RIGHT UPPER ARM  ARTERIOVENOUS GORETEX GRAFT USING X 10CM GORE-TEX GRAFT;  Surgeon: Carlin FORBES Haddock, MD;  Location: [facility] OR;  Service: Vascular;  Laterality: Right;   REVISION OF ARTERIOVENOUS GORETEX GRAFT Right 01/30/2016   Procedure: REVISION OF RIGHT UPPER ARM ARTERIOVENOUS USING GORETEX GRAFT;  Surgeon: Lonni GORMAN Blade, MD;  Location: [facility] OR;  Service: Vascular;  Laterality: Right;   THROMBECTOMY W/ EMBOLECTOMY Left 03/05/2013   Procedure: THROMBECTOMY OF LEFT BRACHIAL ARTERY;  Surgeon: Redell LITTIE Door, MD;  Location: [facility] OR;  Service: Vascular;  Laterality: Left;   VENOGRAM Bilateral 03/25/2014   Procedure: VENOGRAM bilateral;  Surgeon: Redell LITTIE Door, MD;  Location: [facility] CATH LAB;  Service: Cardiovascular;  Laterality: Bilateral;    Social History: Social History   Tobacco Use   Smoking status: Never    Passive exposure: Never   Smokeless tobacco: Never  Vaping Use   Vaping status: Never Used  Substance Use Topics   Alcohol use: No    Alcohol/week: 0.0 standard drinks of alcohol   Drug use: No   Allergies  Allergen Reactions   Pollen Extract Itching    Unknown   Oxycodone -Acetaminophen  Itching    Tolerates with benadryl      Family History  Problem Relation Age of Onset   Diabetes Mother    Hyperlipidemia Mother    Hypertension Mother    Kidney disease Mother    Anesthesia problems Neg Hx    Hypotension Neg Hx    Malignant hyperthermia Neg Hx    Pseudochol deficiency Neg Hx    Colon cancer Neg Hx    Esophageal cancer Neg Hx    Inflammatory bowel disease Neg Hx    Liver disease Neg Hx    Pancreatic cancer Neg Hx    Rectal cancer Neg Hx    Stomach cancer Neg Hx      Prior to Admission medications   Medication Sig Start Date End Date Taking? Authorizing Provider   acetaminophen  (TYLENOL ) 500 MG tablet Take 2 tablets (1,000 mg total) by mouth every 6 (six) hours as needed for mild pain (pain score 1-3) or fever. 09/16/23   Vicci Burnard SAUNDERS, PA-C  albuterol  (VENTOLIN  HFA) 108 (90 Base) MCG/ACT inhaler Inhale 1-2 puffs into the lungs every 6 (six) hours as needed for wheezing or shortness of breath. 01/01/23   Crain, Whitney L, PA  bacitracin  ointment Apply 1 Application topically 2 (two) times daily. Patient taking differently: Apply 1 Application topically  daily as needed for wound care. 07/17/23   Simon Lavonia SAILOR, MD  cinacalcet  (SENSIPAR ) 30 MG tablet Take 6 tablets (180 mg total) by mouth every Monday, Wednesday, and Friday at 6 PM. 09/30/23   Darci Pore, MD  docusate sodium  (COLACE) 100 MG capsule Take 1 capsule (100 mg total) by mouth daily as needed for mild constipation. 09/16/23   Vicci Burnard SAUNDERS, PA-C  gabapentin  (NEURONTIN ) 100 MG capsule Take 200 mg by mouth at bedtime.    [provider]  megestrol  (MEGACE ) 20 MG tablet Take 1 tablet (20 mg total) by mouth daily. 11/02/22   Ajewole, Christana, MD  methocarbamol  (ROBAXIN ) 500 MG tablet Take 1 tablet (500 mg total) by mouth every 8 (eight) hours as needed for muscle spasms. 09/16/23   Vicci Burnard SAUNDERS, PA-C  pantoprazole  (PROTONIX ) 40 MG tablet Take 1 tablet (40 mg total) by mouth daily. 01/12/22   Mansouraty, Gabriel Jr., MD  polyethylene glycol powder (GLYCOLAX /MIRALAX ) 17 GM/SCOOP powder Dissolve 1 capful (17g) in 4-8 ounces of liquid and take by mouth daily 2 (two) times daily. 09/16/23   Vicci Burnard SAUNDERS, PA-C  sevelamer  carbonate (RENVELA ) 800 MG tablet TAKE 3 TABLETS BY MOUTH BEFORE MEALS THREE TIMES DAILY AND 3 TABLETS BEFORE SNACKS TWICE A DAY 08/10/23   [provider]  SYMBICORT 80-4.5 MCG/ACT inhaler Inhale 2 puffs into the lungs daily. 07/28/23   [provider]    Physical Exam: Vitals:   10/14/23 1522 10/14/23 1845  BP: (!) 145/88 (!) 152/97  Pulse: 87  (!) 102  Resp: 18 18  Temp: 98.4 F (36.9 C) 98.1 F (36.7 C)  TempSrc: Oral Oral  SpO2: 97% 91%   Constitutional: Obese woman resting supine in bed, intermittently grimaces in pain Eyes: EOMI, lids and conjunctivae normal ENMT: Mucous membranes are moist. Posterior pharynx clear of any exudate or lesions.Normal dentition.  Neck: normal, supple, no masses. Respiratory: clear to auscultation anteriorly. Normal respiratory effort. No accessory muscle use.  Cardiovascular: Regular rate and rhythm, no murmurs / rubs / gallops.  Wound dressing in place lower extremities Abdomen: no tenderness, no masses palpated. Musculoskeletal: no clubbing / cyanosis.  Skin: Wound dressings in place bilateral lower extremities Neurologic: Sensation intact. Strength equal in all extremities. Psychiatric: Normal judgment and insight. Alert and oriented x 3. Normal mood.   EKG: Not performed.  Assessment/Plan Principal Problem:   Bilateral pubic rami fractures, closed, initial encounter (HCC) Active Problems:   Sacral fracture, closed (HCC)   Anemia in chronic kidney disease (CKD)   ESRD on dialysis (HCC)   Chronic combined systolic and diastolic CHF (congestive heart failure) (HCC)   Calciphylaxis of lower extremity with nonhealing ulcer (HCC)   [name] is a 48 y.o. 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  Assessment and Plan: Bilateral superior and inferior pubic rami fractures Left sacral alla fracture: Occurring after mechanical fall.  Orthopedics Dr. Barton is aware and recommended nonoperative management, WBAT with walker and 1-2 person assist, post mobilization weightbearing pelvis AP inlet and outlet films (see  plan of care note).  Continue analgesics as needed, PT/OT consult.  ESRD on MWF HD: Patient reports completing her usual dialysis this morning.  Will need nephrology consult for routine HD needs if she  remains in hospital on Monday.  No emergent need for dialysis tonight.  Calcium  mildly elevated, continue home Cinacalcet .  Chronic, systolic and diastolic CHF: TTE 11/23/2022 showed EF 25-30%, G3DD, severely dilated LV internal cavity, moderate LVH, moderately dilated atria.  Not currently on GDMT.  Appears euvolemic.  Volume managed with dialysis.  She has appointment scheduled with cardiology Dr. Ladona on 12/08/2023.  Calciphylaxis with chronic ulcerative wounds to bilateral lower extremities: Follows with outpatient wound care center.  Consult to wound care for continued management in hospital.  Anemia of ESRD: Hemoglobin stable.  Continue to monitor.  Leukocytosis: Likely reactive in setting of pelvic fractures.  Continue to monitor.  Asthma: Stable.  Continue Symbicort and albuterol  as needed.   DVT prophylaxis: heparin  injection 5,000 Units Start: 10/14/23 2200 Code Status: Full code, confirmed with patient on admission Family Communication: Friend at bedside Disposition Plan: From home, dispo pending clinical progress Consults called: Orthopedics Severity of Illness: The appropriate patient status for this patient is OBSERVATION. Observation status is judged to be reasonable and necessary in order to provide the required intensity of service to ensure the patient's safety. The patient's presenting symptoms, physical exam findings, and initial radiographic and laboratory data in the context of their medical condition is felt to place them at decreased risk for further clinical deterioration. Furthermore, it is anticipated that the patient will be medically stable for discharge from the hospital within 2 midnights of admission.   Jorie Blanch MD [facility]  If 7PM-7AM, please  contact night-coverage [email or web address]  10/14/2023, 8:29 PM

## 2023-10-15 ENCOUNTER — Other Ambulatory Visit: Payer: Self-pay

## 2023-10-15 ENCOUNTER — Encounter (HOSPITAL_COMMUNITY): Payer: Self-pay | Admitting: Internal Medicine

## 2023-10-15 DIAGNOSIS — Z8249 Family history of ischemic heart disease and other diseases of the circulatory system: Secondary | ICD-10-CM | POA: Diagnosis not present

## 2023-10-15 DIAGNOSIS — N186 End stage renal disease: Secondary | ICD-10-CM | POA: Diagnosis present

## 2023-10-15 DIAGNOSIS — M549 Dorsalgia, unspecified: Secondary | ICD-10-CM | POA: Diagnosis not present

## 2023-10-15 DIAGNOSIS — E66813 Obesity, class 3: Secondary | ICD-10-CM | POA: Diagnosis present

## 2023-10-15 DIAGNOSIS — Z833 Family history of diabetes mellitus: Secondary | ICD-10-CM | POA: Diagnosis not present

## 2023-10-15 DIAGNOSIS — Z83438 Family history of other disorder of lipoprotein metabolism and other lipidemia: Secondary | ICD-10-CM | POA: Diagnosis not present

## 2023-10-15 DIAGNOSIS — I2510 Atherosclerotic heart disease of native coronary artery without angina pectoris: Secondary | ICD-10-CM | POA: Diagnosis present

## 2023-10-15 DIAGNOSIS — S32592A Other specified fracture of left pubis, initial encounter for closed fracture: Secondary | ICD-10-CM | POA: Diagnosis present

## 2023-10-15 DIAGNOSIS — I132 Hypertensive heart and chronic kidney disease with heart failure and with stage 5 chronic kidney disease, or end stage renal disease: Secondary | ICD-10-CM | POA: Diagnosis present

## 2023-10-15 DIAGNOSIS — N184 Chronic kidney disease, stage 4 (severe): Secondary | ICD-10-CM

## 2023-10-15 DIAGNOSIS — I5042 Chronic combined systolic (congestive) and diastolic (congestive) heart failure: Secondary | ICD-10-CM | POA: Diagnosis present

## 2023-10-15 DIAGNOSIS — Z992 Dependence on renal dialysis: Secondary | ICD-10-CM | POA: Diagnosis not present

## 2023-10-15 DIAGNOSIS — W101XXA Fall (on)(from) sidewalk curb, initial encounter: Secondary | ICD-10-CM | POA: Diagnosis present

## 2023-10-15 DIAGNOSIS — I700 Atherosclerosis of aorta: Secondary | ICD-10-CM | POA: Diagnosis present

## 2023-10-15 DIAGNOSIS — I420 Dilated cardiomyopathy: Secondary | ICD-10-CM | POA: Diagnosis present

## 2023-10-15 DIAGNOSIS — E785 Hyperlipidemia, unspecified: Secondary | ICD-10-CM | POA: Diagnosis present

## 2023-10-15 DIAGNOSIS — Z7401 Bed confinement status: Secondary | ICD-10-CM | POA: Diagnosis not present

## 2023-10-15 DIAGNOSIS — Z6841 Body Mass Index (BMI) 40.0 and over, adult: Secondary | ICD-10-CM | POA: Diagnosis not present

## 2023-10-15 DIAGNOSIS — Z7951 Long term (current) use of inhaled steroids: Secondary | ICD-10-CM | POA: Diagnosis not present

## 2023-10-15 DIAGNOSIS — J45909 Unspecified asthma, uncomplicated: Secondary | ICD-10-CM | POA: Diagnosis present

## 2023-10-15 DIAGNOSIS — E8889 Other specified metabolic disorders: Secondary | ICD-10-CM | POA: Diagnosis present

## 2023-10-15 DIAGNOSIS — S3210XA Unspecified fracture of sacrum, initial encounter for closed fracture: Secondary | ICD-10-CM | POA: Diagnosis present

## 2023-10-15 DIAGNOSIS — Z79899 Other long term (current) drug therapy: Secondary | ICD-10-CM | POA: Diagnosis not present

## 2023-10-15 DIAGNOSIS — S32591A Other specified fracture of right pubis, initial encounter for closed fracture: Secondary | ICD-10-CM | POA: Diagnosis present

## 2023-10-15 DIAGNOSIS — Z751 Person awaiting admission to adequate facility elsewhere: Secondary | ICD-10-CM | POA: Diagnosis not present

## 2023-10-15 DIAGNOSIS — L97929 Non-pressure chronic ulcer of unspecified part of left lower leg with unspecified severity: Secondary | ICD-10-CM | POA: Diagnosis present

## 2023-10-15 DIAGNOSIS — S32119A Unspecified Zone I fracture of sacrum, initial encounter for closed fracture: Secondary | ICD-10-CM | POA: Diagnosis present

## 2023-10-15 DIAGNOSIS — D631 Anemia in chronic kidney disease: Secondary | ICD-10-CM

## 2023-10-15 DIAGNOSIS — L97919 Non-pressure chronic ulcer of unspecified part of right lower leg with unspecified severity: Secondary | ICD-10-CM | POA: Diagnosis present

## 2023-10-15 LAB — BASIC METABOLIC PANEL WITH GFR
Anion gap: 23 — ABNORMAL HIGH (ref 5–15)
BUN: 33 mg/dL — ABNORMAL HIGH (ref 6–20)
CO2: 23 mmol/L (ref 22–32)
Calcium: 9.1 mg/dL (ref 8.9–10.3)
Chloride: 90 mmol/L — ABNORMAL LOW (ref 98–111)
Creatinine, Ser: 10.19 mg/dL — ABNORMAL HIGH (ref 0.44–1.00)
GFR, Estimated: 4 mL/min — ABNORMAL LOW (ref >60)
Glucose, Bld: 91 mg/dL (ref 70–99)
Potassium: 4 mmol/L (ref 3.5–5.1)
Sodium: 136 mmol/L (ref 135–145)

## 2023-10-15 LAB — CBC
HCT: 25.5 % — ABNORMAL LOW (ref 36.0–46.0)
Hemoglobin: 8.1 g/dL — ABNORMAL LOW (ref 12.0–15.0)
MCH: 30.9 pg (ref 26.0–34.0)
MCHC: 31.8 g/dL (ref 30.0–36.0)
MCV: 97.3 fL (ref 80.0–100.0)
Platelets: 244 K/uL (ref 150–400)
RBC: 2.62 MIL/uL — ABNORMAL LOW (ref 3.87–5.11)
RDW: 15.6 % — ABNORMAL HIGH (ref 11.5–15.5)
WBC: 13.6 K/uL — ABNORMAL HIGH (ref 4.0–10.5)
nRBC: 0 % (ref 0.0–0.2)

## 2023-10-15 LAB — HEPATITIS B SURFACE ANTIGEN: Hepatitis B Surface Ag: NONREACTIVE

## 2023-10-15 MED ORDER — CHLORHEXIDINE GLUCONATE CLOTH 2 % EX PADS
6.0000 | MEDICATED_PAD | Freq: Every day | CUTANEOUS | Status: DC
Start: 2023-10-16 — End: 2023-10-18

## 2023-10-15 MED ORDER — ANTICOAGULANT SODIUM CITRATE 4% (200MG/5ML) IV SOLN: 5.0000 mL | Status: DC | PRN

## 2023-10-15 MED ORDER — HEPARIN SODIUM (PORCINE) 1000 UNIT/ML DIALYSIS: 1000.0000 [IU] | INTRAMUSCULAR | Status: DC | PRN

## 2023-10-15 MED ORDER — HYDROMORPHONE HCL 1 MG/ML IJ SOLN
INTRAMUSCULAR | Status: AC
  Filled 2023-10-15: qty 1

## 2023-10-15 MED ORDER — POLYETHYLENE GLYCOL 3350 17 GM/SCOOP PO POWD
17.0000 g | Freq: Two times a day (BID) | ORAL | Status: DC
  Filled 2023-10-15: qty 119

## 2023-10-15 MED ORDER — ALTEPLASE 2 MG IJ SOLR: 2.0000 mg | Freq: Once | INTRAMUSCULAR | Status: DC | PRN

## 2023-10-15 MED ORDER — PENTAFLUOROPROP-TETRAFLUOROETH EX AERO: 1.0000 | INHALATION_SPRAY | CUTANEOUS | Status: DC | PRN

## 2023-10-15 MED ORDER — MEGESTROL ACETATE 20 MG PO TABS
20.0000 mg | ORAL_TABLET | Freq: Two times a day (BID) | ORAL | Status: DC
  Administered 2023-10-15 – 2023-10-21 (×12): 20 mg via ORAL
  Filled 2023-10-15 (×14): qty 1

## 2023-10-15 MED ORDER — POLYETHYLENE GLYCOL 3350 17 G PO PACK
17.0000 g | PACK | Freq: Two times a day (BID) | ORAL | Status: DC
  Administered 2023-10-15 – 2023-10-20 (×6): 17 g via ORAL
  Filled 2023-10-15 (×11): qty 1

## 2023-10-15 MED ORDER — LIDOCAINE HCL (PF) 1 % IJ SOLN: 5.0000 mL | INTRAMUSCULAR | Status: DC | PRN

## 2023-10-15 MED ORDER — LIDOCAINE-PRILOCAINE 2.5-2.5 % EX CREA: 1.0000 | TOPICAL_CREAM | CUTANEOUS | Status: DC | PRN

## 2023-10-15 MED ORDER — HYDROCODONE-ACETAMINOPHEN 5-325 MG PO TABS
ORAL_TABLET | ORAL | Status: AC
  Filled 2023-10-15: qty 2

## 2023-10-15 NOTE — Plan of Care (Addendum)
  Problem: Education: Goal: Knowledge of General Education information will improve Description: Including pain rating scale, medication(s)/side effects and non-pharmacologic comfort measures Outcome: Progressing   Problem: Clinical Measurements: Goal: Will remain free from infection Outcome: Progressing   Problem: Nutrition: Goal: Adequate nutrition will be maintained Outcome: Progressing   Problem: Coping: Goal: Level of anxiety will decrease Outcome: Progressing   Problem: Activity: Goal: Risk for activity intolerance will decrease Outcome: Not Progressing   Problem: Pain Managment: Goal: General experience of comfort will improve and/or be controlled Outcome: Not Progressing   Shift Summary:  Pt arrived to unit around 04:40am. Settled patient in the bed but complained of pain when rolling, rating the pain 8/10. Patient's pants and underwear were fairly tight on the patient and patient stated that she is in a lot of pain to roll again, agreed to let this RN cut her pants off and discard both the underwear and pants afterward. Patient answered admission question to the fullest capability before sleeping after receiving PRN medication. Patient declined for her shirt to be removed. At this time, no further complaints. Resting in the bed.
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
## 2023-10-15 NOTE — ED Notes (Addendum)
 [facility] called and setup transportation

## 2023-10-15 NOTE — Plan of Care (Signed)
  Problem: Activity: Goal: Risk for activity intolerance will decrease Outcome: Progressing   Problem: Pain Managment: Goal: General experience of comfort will improve and/or be controlled Outcome: Progressing   Problem: Safety: Goal: Ability to remain free from injury will improve Outcome: Progressing   Problem: Skin Integrity: Goal: Risk for impaired skin integrity will decrease Outcome: Progressing

## 2023-10-15 NOTE — Progress Notes (Signed)
 [facility] PROGRESS NOTE   [name] FMW:[id number] DOB: [birth year] DOA: 10/14/2023  PCP: Luvenia Sherry, FNP  Brief History: 48 y.o. 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  Patient was found to have left sacral alar fracture along with bilateral superior and inferior pubic rami fracture.  Patient was unable to ambulate.  She was hospitalized for further management.  Consultants: Dr. Barton with orthopedics  Procedures: None yet    Subjective/Interval History: Patient complains of severe pain in the back as well as in her legs at the site of her wounds.  No nausea vomiting.  Has not been able to get out of the bed yet.  No chest pain or shortness of breath.    Assessment/Plan:  Bilateral superior and inferior pubic rami fractures/left sacral ala fracture Orthopedic note reviewed.  Plan is for nonoperative management.  Weightbearing as tolerated.  Await PT eval.  Pain control.  This was a mechanical fall. Outpatient follow-up with orthopedics in 1-2 weeks.  ESRD on hemodialysis She is dialyzed on Monday Wednesday Friday schedule.  Underwent dialysis yesterday.  Next dialysis is due on Monday.  If she is still here then we will notify nephrology.  Chronic systolic and diastolic CHF Echocardiogram from 2024 showed LVEF of 25 to 30%.  Grade 3 diastolic dysfunction was noted.  Currently euvolemic.  Volume is managed with dialysis.  Followed by Dr. Ladona with cardiology. Continue to assess volume status closely.  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  Wound care was consulted.  Continue dressing changes as recommended by wound  care.  Anemia of ESRD Stable hemoglobin noted.  Leukocytosis Most likely reactive.  She is afebrile.  Will recheck labs in the morning.  She does make urine but denies any dysuria.  No obvious infectious source is identified otherwise.  History of asthma Stable.  Class III obesity Estimated body mass index is 42.17 kg/m as calculated from the following:   Height as of 10/08/23: 5' 8 (1.727 m).   Weight as of 10/08/23: 125.8 kg.  DVT Prophylaxis: Subcutaneous heparin  Code Status: Full code Family Communication: Discussed with patient Disposition Plan: To be determined  Status is: Observation The patient will require care spanning > 2 midnights and should be moved to inpatient because: Ambulatory dysfunction from pelvic fractures.  Acute pain      Medications: Scheduled:  cadexomer iodine    Topical QODAY   [START ON 10/17/2023] cinacalcet   180 mg Oral Q M,W,F-1800   fluticasone  furoate-vilanterol  1 puff Inhalation Daily   gabapentin   200 mg Oral QHS   heparin   5,000 Units Subcutaneous Q8H   pantoprazole   40 mg Oral Daily   sevelamer  carbonate  2,400 mg Oral TID WC   Continuous: PRN:acetaminophen  **OR** acetaminophen , albuterol , bisacodyl, diphenhydrAMINE  **OR** diphenhydrAMINE , HYDROcodone -acetaminophen , HYDROmorphone  (DILAUDID ) injection, methocarbamol , ondansetron  **OR** ondansetron  (ZOFRAN ) IV, senna-docusate  Antibiotics: Anti-infectives (From admission, onward)    None       Objective:  Vital Signs  Vitals:   10/15/23 0440 10/15/23 0825 10/15/23 0842 10/15/23 0845  BP: (!) 148/89 118/69  128/77  Pulse: 90 85  64  Resp: 18 20  18   Temp: 97.7 F (36.5 C) 99.3 F (37.4 C)    TempSrc:  Oral Oral    SpO2: 97% 92% 92% 100%    Intake/Output Summary (Last 24 hours) at 10/15/2023 0845 Last data filed at 10/15/2023 0653 Gross per 24 hour  Intake 240 ml  Output --  Net 240 ml    General appearance: Awake alert.  In no distress Resp: Clear to  auscultation bilaterally.  Normal effort Cardio: S1-S2 is normal regular.  No S3-S4.  No rubs murmurs or bruit GI: Abdomen is soft.  Nontender nondistended.  Bowel sounds are present normal.  No masses organomegaly Extremities: Significantly restricted range of motion of bilateral lower extremities.  Secondary to pain. dressing noted on both legs. Neurologic: Alert and oriented x3.  No focal neurological deficits.    Lab Results:  Data Reviewed: I have personally reviewed following labs and reports of the imaging studies  CBC: Recent Labs  Lab 10/08/23 1654 10/14/23 1942  WBC 10.1 19.1*  HGB 9.2* 9.3*  HCT 29.5* 30.1*  MCV 99.0 99.3  PLT 206 290    Basic Metabolic Panel: Recent Labs  Lab 10/08/23 1654 10/14/23 1942  NA 137 139  K 4.5 3.7  CL 92* 89*  CO2 23 24  GLUCOSE 93 80  BUN 49* 32*  CREATININE 12.36* 9.01*  CALCIUM  9.8 10.7*    GFR: Estimated Creatinine Clearance: 10.6 mL/min (A) (by C-G formula based on SCr of 9.01 mg/dL (H)).    Radiology Studies: CT L-SPINE NO CHARGE Result Date: 10/14/2023 CLINICAL DATA:  Fall, back pain EXAM: CT LUMBAR SPINE WITHOUT CONTRAST TECHNIQUE: Multidetector CT imaging of the lumbar spine was performed without intravenous contrast administration. Multiplanar CT image reconstructions were also generated. RADIATION DOSE REDUCTION: This exam was performed according to the departmental dose-optimization program which includes automated exposure control, adjustment of the mA and/or kV according to patient size and/or use of iterative reconstruction technique. COMPARISON:  None Available. FINDINGS: Segmentation: 5 lumbar type vertebrae. Alignment: Normal. Vertebrae: No lumbar spine fracture. Fractures noted through the left sacral ala, nondisplaced. SI joints are intact. Paraspinal and other soft tissues: Negative. Disc levels: Degenerative facet disease most pronounced in the lower lumbar spine from L3-4 through L5-S1. Vacuum disc, disc  space narrowing and spurring noted at L5-S1 and at the thoracolumbar junction. IMPRESSION: Left sacral fracture. Electronically Signed   By: Franky Crease M.D.   On: 10/14/2023 17:15   CT ABDOMEN PELVIS WO CONTRAST Result Date: 10/14/2023 CLINICAL DATA:  Hip trauma, fracture suspected, no prior imaging. Fall, back pain EXAM: CT ABDOMEN AND PELVIS WITHOUT CONTRAST TECHNIQUE: Multidetector CT imaging of the abdomen and pelvis was performed following the standard protocol without IV contrast. RADIATION DOSE REDUCTION: This exam was performed according to the departmental dose-optimization program which includes automated exposure control, adjustment of the mA and/or kV according to patient size and/or use of iterative reconstruction technique. COMPARISON:  09/16/2023 FINDINGS: Lower chest: Cardiomegaly. Ill-defined 1.9 cm ovoid density in the left lower lobe adjacent to the left heart border, unchanged since prior study. Surrounding ground-glass density is stable. No effusions. Coronary artery and aortic atherosclerosis. Hepatobiliary: Cholelithiasis. No CT evidence of acute cholecystitis. No focal hepatic abnormality. Pancreas: No focal abnormality or ductal dilatation. Spleen: No focal abnormality.  Normal size. Adrenals/Urinary Tract: Atrophic kidneys bilaterally no hydronephrosis. Bilateral renal low-density lesions and nonobstructing stones, unchanged. No follow-up imaging recommended. Urinary bladder unremarkable. Stomach/Bowel: Normal appendix. Stomach, large and small bowel grossly unremarkable. Vascular/Lymphatic: Aortic atherosclerosis. No evidence of aneurysm or adenopathy. Reproductive: Uterus and adnexa unremarkable.  No mass. Other: No  free fluid or free air. Musculoskeletal: Fractures through the superior and inferior pubic rami bilaterally, new since prior study. Left sacral ala fracture. IMPRESSION: Bilateral superior and inferior pubic rami fractures. Left sacral ala fracture. Coronary artery  disease, aortic atherosclerosis. Cholelithiasis. Bilateral nephrolithiasis.  No hydronephrosis. Electronically Signed   By: Franky Crease M.D.   On: 10/14/2023 17:12   DG Chest 2 View Result Date: 10/14/2023 EXAM: 2 VIEW(S) XRAY OF THE CHEST 10/14/2023 04:42:00 PM COMPARISON: 09/13/2023 CLINICAL HISTORY: fall. Back pain immediately after. FINDINGS: LUNGS AND PLEURA: Central pulmonary vascular congestion. No focal pulmonary opacity. No pleural effusion. No pneumothorax. HEART AND MEDIASTINUM: Stable cardiomegaly. BONES AND SOFT TISSUES: No acute osseous abnormality. IMPRESSION: 1. Stable cardiomegaly with central pulmonary vascular congestion. Electronically signed by: Lynwood Seip MD 10/14/2023 04:46 PM EDT RP Workstation: [id number]       LOS: 0 days   Joette Pebbles  [facility] Pager on [email or web address]  10/15/2023, 8:45 AM

## 2023-10-15 NOTE — ED Notes (Signed)
 Report given to Hadassah PEAK at [facility] 2W

## 2023-10-15 NOTE — ED Notes (Signed)
 Patient placed on oxygen as patient Spo2 dropped to mid 80s%. Currently at 95% on 2L [place]

## 2023-10-15 NOTE — Consult Note (Signed)
 Reason for Consult: To manage dialysis and dialysis related needs Referring Physician: Dr. Verdene Shona [name] Abigail is an 48 y.o. female.  HPI: Pt is a 72F with a PMH sig for ESRD on HD MWF, HTN, HLD, CHF, obesity, and calciphylaxis who is now seen for management of ESRD and provision of HD.  Pt stepped off a curb yesterday and sustained a mechanical fall.  Hit the ground hard, but didn't fall on her head or lose consciousness.    Came to ED, noted bilateral inferior and superior pubic rami fractures along with a L sacral ala fracture.   Missed HD Friday according to notes in [facility], confirmed with charge RN.  Looks like multiple missed and shortened rx.  Getting rx for calciphylaxis.    Dialyzes at [facility] MWF 4 hrs 2K 2 Ca EDW 122.7 BFR450 mL/ min via AVF Sensipar  180 mg three times per week Mircera 100 mcg q 2 weeks  Sodium thiosulfate  25 g IV three times per week   Past Medical History:  Diagnosis Date   Anemia    Anxiety    pt not aware of this   Arthritis    Breast mass 09/01/2016   Targeted ultrasound is performed, showing a mass in the left breast at 11 o'clock, 1 cm from the nipple measuring 6.4 by 5.0 cm. The mass is heterogeneous and may contain macroscopic fat.   IMPRESSION: The mass in the left breast is probably benign, possibly a Hamartoma.  RECOMMENDATION: Recommend six-month follow-up mammogram of the probably benign left breast mass. If necessary, an ultrasound co   CHF (congestive heart failure) (HCC)    hosp. 2011   Dilated cardiomyopathy (HCC)    Encounter for blood transfusion    ESRD on hemodialysis (HCC)    [facility] MWF   Family history of adverse reaction to anesthesia      Aunt has a tracheostomy and kept coughing after colonoscopy    Foot fracture    Gout    no flare up for several yrs   Headache    Heart murmur    as a child, no problems as an adult   History of blood transfusion    x 2   History of pneumonia    Hyperlipidemia     Hypertension    Menorrhagia    Morbid obesity (HCC)    Pneumonia    hosp. 2010   Shortness of breath    HX CHL - occasional. 04/09/21 patient sits down and shortness of breath susides.   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   Thyroid disease     Past Surgical History:  Procedure Laterality Date   A/V FISTULAGRAM Left 08/13/2021   Procedure: A/V Fistulagram;  Surgeon: Gretta Lonni PARAS, MD;  Location: [facility] INVASIVE CV LAB;  Service: Cardiovascular;  Laterality: Left;   ANGIOPLASTY  07/28/2015   Procedure: ANGIOPLASTY OF RIGHT BRACHIAL VEIN;  Surgeon: Redell [name] Door, MD;  Location: [facility] OR;  Service: Vascular;;   AV FISTULA PLACEMENT  08/04/2011   Procedure: ARTERIOVENOUS (AV) FISTULA CREATION;  Surgeon: Carlin FORBES Haddock, MD;  Location: [facility] OR;  Service: Vascular;  Laterality: Right;  Creation right brachiocephalic arteriovenous fistula   AV FISTULA PLACEMENT Left 02/05/2013   Procedure: ARTERIOVENOUS FISTULA CREATION LEFT ARM;  Surgeon: Redell [name] Door, MD;  Location: [facility] OR;  Service:  Vascular;  Laterality: Left;   AV FISTULA PLACEMENT Right 06/04/2013   Procedure: ARTERIOVENOUS (AV) FISTULA CREATION;  Surgeon: Redell [name] Door, MD;  Location: [facility] OR;  Service: Vascular;  Laterality: Right;   AV FISTULA PLACEMENT Right 04/15/2014   Procedure: INSERTION OF ARTERIOVENOUS (AV) GORE-TEX GRAFT ARM;  Surgeon: Redell [name] Door, MD;  Location: [facility] OR;  Service: Vascular;  Laterality: Right;   AV FISTULA PLACEMENT Left 02/25/2021   Procedure: LEFT ARM ARTERIOVENOUS (AV) FISTULA CREATION;  Surgeon: Gretta Lonni PARAS, MD;  Location: [facility] OR;  Service: Vascular;  Laterality: Left;   AV FISTULA PLACEMENT Left 08/26/2021   Procedure: LEFT ARM LIGATION OF BRACHIOBASILIC FISTULA,  REPAIR PSEUDOANEURYSM;  Surgeon: Gretta Lonni PARAS, MD;  Location: [facility] OR;  Service: Vascular;  Laterality: Left;    AV FISTULA PLACEMENT Left 10/12/2021   Procedure: LEFT RADIOCEPHALIC ARTERIOVENOUS FISTULA  ARTERIOVENOUS (AV) FISTULA CREATION WITH INTERPOSITION GRAFT;  Surgeon: Gretta Lonni PARAS, MD;  Location: [facility] OR;  Service: Vascular;  Laterality: Left;   BASCILIC VEIN TRANSPOSITION Right 08/13/2013   Procedure: Second Stage Brachial Vein Transposition;  Surgeon: Redell [name] Door, MD;  Location: [facility] OR;  Service: Vascular;  Laterality: Right;   BASCILIC VEIN TRANSPOSITION Left 04/13/2021   Procedure: LEFT ARM SECOND STAGE BASILIC VEIN TRANSPOSITION;  Surgeon: Gretta Lonni PARAS, MD;  Location: [facility] OR;  Service: Vascular;  Laterality: Left;   CARDIAC SURGERY  1979   repair of hole in heart   GRAFT APPLICATION Left 10/12/2021   Procedure: ARTEGRAFT COLLAGEN VASCULAR GRAFT APPLICATION;  Surgeon: Gretta Lonni PARAS, MD;  Location: [facility] OR;  Service: Vascular;  Laterality: Left;   INCISION AND DRAINAGE OF WOUND Right 07/28/2015   Procedure: IRRIGATION AND DEBRIDEMENT RIGHT HAND  WOUND;  Surgeon: Balinda Rogue, MD;  Location: [facility] OR;  Service: Plastics;  Laterality: Right;   LIGATION OF ARTERIOVENOUS  FISTULA Left 03/05/2013   Procedure: LIGATION OF ARTERIOVENOUS  FISTULA;  Surgeon: Redell [name] Door, MD;  Location: [facility] OR;  Service: Vascular;  Laterality: Left;   PATCH ANGIOPLASTY Left 03/05/2013   Procedure: PATCH ANGIOPLASTY;  Surgeon: Redell [name] Door, MD;  Location: [facility] OR;  Service: Vascular;  Laterality: Left;   PERIPHERAL VASCULAR BALLOON ANGIOPLASTY Left 08/13/2021   Procedure: PERIPHERAL VASCULAR BALLOON ANGIOPLASTY;  Surgeon: Gretta Lonni PARAS, MD;  Location: [facility] INVASIVE CV LAB;  Service: Cardiovascular;  Laterality: Left;  arm fistula   PERIPHERAL VASCULAR CATHETERIZATION Right 10/31/2014   Procedure: Fistulagram;  Surgeon: Redell [name] Door, MD;  Location: [facility] INVASIVE CV LAB;  Service: Cardiovascular;  Laterality: Right;   PERIPHERAL VASCULAR CATHETERIZATION Right 10/31/2014   Procedure: Peripheral Vascular Balloon  Angioplasty;  Surgeon: Redell [name] Door, MD;  Location: [facility] INVASIVE CV LAB;  Service: Cardiovascular;  Laterality: Right;  pta venous rt arm   PERIPHERAL VASCULAR CATHETERIZATION N/A 03/13/2015   Procedure: A/V Shuntogram;  Surgeon: Redell [name] Door, MD;  Location: [facility] INVASIVE CV LAB;  Service: Cardiovascular;  Laterality: N/A;   PERIPHERAL VASCULAR CATHETERIZATION Right 03/13/2015   Procedure: Peripheral Vascular Balloon Angioplasty;  Surgeon: Redell [name] Door, MD;  Location: [facility] INVASIVE CV LAB;  Service: Cardiovascular;  Laterality: Right;  Shunt   PERIPHERAL VASCULAR CATHETERIZATION Right 01/19/2016   Procedure: A/V Shuntogram;  Surgeon: Lonni GORMAN Blade, MD;  Location: [facility] INVASIVE CV LAB;  Service: Cardiovascular;  Laterality: Right;   REVISION OF ARTERIOVENOUS GORETEX GRAFT Right 10/27/2015   Procedure: THROMBECTOMY AND REVISION OF RIGHT UPPER ARM  ARTERIOVENOUS GORETEX GRAFT USING X 10CM GORE-TEX GRAFT;  Surgeon: Carlin FORBES Haddock, MD;  Location: [facility] OR;  Service: Vascular;  Laterality: Right;   REVISION OF ARTERIOVENOUS GORETEX GRAFT Right 01/30/2016   Procedure: REVISION OF RIGHT UPPER ARM ARTERIOVENOUS USING GORETEX GRAFT;  Surgeon: Lonni GORMAN Blade, MD;  Location: [facility] OR;  Service: Vascular;  Laterality: Right;   THROMBECTOMY W/ EMBOLECTOMY Left 03/05/2013   Procedure: THROMBECTOMY OF LEFT BRACHIAL ARTERY;  Surgeon: Redell [name] Door, MD;  Location: [facility] OR;  Service: Vascular;  Laterality: Left;   VENOGRAM Bilateral 03/25/2014   Procedure: VENOGRAM bilateral;  Surgeon: Redell [name] Door, MD;  Location: [facility] CATH LAB;  Service: Cardiovascular;  Laterality: Bilateral;    Family History  Problem Relation Age of Onset   Diabetes Mother    Hyperlipidemia Mother    Hypertension Mother    Kidney disease Mother    Anesthesia problems Neg Hx    Hypotension Neg Hx    Malignant hyperthermia Neg Hx    Pseudochol deficiency Neg Hx    Colon cancer Neg Hx    Esophageal cancer Neg Hx    Inflammatory bowel disease  Neg Hx    Liver disease Neg Hx    Pancreatic cancer Neg Hx    Rectal cancer Neg Hx    Stomach cancer Neg Hx     Social History:  reports that she has never smoked. She has never been exposed to tobacco smoke. She has never used smokeless tobacco. She reports that she does not drink alcohol and does not use drugs.  Allergies:  Allergies  Allergen Reactions   Pollen Extract Itching    Medications: Scheduled:  cadexomer iodine    Topical QODAY   [START ON 10/17/2023] cinacalcet   180 mg Oral Q M,W,F-1800   fluticasone  furoate-vilanterol  1 puff Inhalation Daily   gabapentin   200 mg Oral QHS   heparin   5,000 Units Subcutaneous Q8H   megestrol   20 mg Oral BID   pantoprazole   40 mg Oral Daily   polyethylene glycol powder  17 g Oral BID   sevelamer  carbonate  2,400 mg Oral TID WC     Results for orders placed or performed during the hospital encounter of 10/14/23 (from the past 48 hours)  CBC     Status: Abnormal   Collection Time: 10/14/23  7:42 PM  Result Value Ref Range   WBC 19.1 (H) 4.0 - 10.5 K/uL   RBC 3.03 (L) 3.87 - 5.11 MIL/uL   Hemoglobin 9.3 (L) 12.0 - 15.0 g/dL   HCT 69.8 (L) 63.9 - 53.9 %   MCV 99.3 80.0 - 100.0 fL   MCH 30.7 26.0 - 34.0 pg   MCHC 30.9 30.0 - 36.0 g/dL   RDW 84.4 88.4 - 84.4 %   Platelets 290 150 - 400 K/uL   nRBC 0.0 0.0 - 0.2 %    Comment: Performed at [facility], [address], [place], [place] [address]  Basic metabolic panel     Status: Abnormal   Collection Time: 10/14/23  7:42 PM  Result Value Ref Range   Sodium 139 135 - 145 mmol/L    Comment: Electrolytes repeated to verify    Potassium 3.7 3.5 - 5.1 mmol/L   Chloride 89 (L) 98 - 111 mmol/L   CO2 24 22 - 32 mmol/L   Glucose, Bld 80 70 - 99 mg/dL    Comment: Glucose reference range applies only to samples taken after fasting for at least 8 hours.   BUN 32 (H) 6 - 20  mg/dL   Creatinine, Ser 0.98 (H) 0.44 - 1.00 mg/dL   Calcium  10.7 (H) 8.9 - 10.3 mg/dL   GFR,  Estimated 5 (L) >60 mL/min    Comment: (NOTE) Calculated using the CKD-EPI Creatinine Equation (2021)    Anion gap 26 (H) 5 - 15    Comment: Performed at [facility], [address] [place], [place] [address]  CBC     Status: Abnormal   Collection Time: 10/15/23  9:20 AM  Result Value Ref Range   WBC 13.6 (H) 4.0 - 10.5 K/uL   RBC 2.62 (L) 3.87 - 5.11 MIL/uL   Hemoglobin 8.1 (L) 12.0 - 15.0 g/dL   HCT 74.4 (L) 63.9 - 53.9 %   MCV 97.3 80.0 - 100.0 fL   MCH 30.9 26.0 - 34.0 pg   MCHC 31.8 30.0 - 36.0 g/dL   RDW 84.3 (H) 88.4 - 84.4 %   Platelets 244 150 - 400 K/uL   nRBC 0.0 0.0 - 0.2 %    Comment: Performed at [facility], [address], [place], [place] [address]  Basic metabolic panel     Status: Abnormal   Collection Time: 10/15/23  9:20 AM  Result Value Ref Range   Sodium 136 135 - 145 mmol/L   Potassium 4.0 3.5 - 5.1 mmol/L   Chloride 90 (L) 98 - 111 mmol/L   CO2 23 22 - 32 mmol/L   Glucose, Bld 91 70 - 99 mg/dL    Comment: Glucose reference range applies only to samples taken after fasting for at least 8 hours.   BUN 33 (H) 6 - 20 mg/dL   Creatinine, Ser 89.80 (H) 0.44 - 1.00 mg/dL   Calcium  9.1 8.9 - 10.3 mg/dL   GFR, Estimated 4 (L) >60 mL/min    Comment: (NOTE) Calculated using the CKD-EPI Creatinine Equation (2021)    Anion gap 23 (H) 5 - 15    Comment: ELECTROLYTES REPEATED TO VERIFY Performed at [facility], [address] [place], [place] [address]     CT L-SPINE NO CHARGE Result Date: 10/14/2023 CLINICAL DATA:  Fall, back pain EXAM: CT LUMBAR SPINE WITHOUT CONTRAST TECHNIQUE: Multidetector CT imaging of the lumbar spine was performed without intravenous contrast administration. Multiplanar CT image reconstructions were also generated. RADIATION DOSE REDUCTION: This exam was performed according to the departmental dose-optimization program which includes automated exposure control, adjustment of the mA and/or kV according  to patient size and/or use of iterative reconstruction technique. COMPARISON:  None Available. FINDINGS: Segmentation: 5 lumbar type vertebrae. Alignment: Normal. Vertebrae: No lumbar spine fracture. Fractures noted through the left sacral ala, nondisplaced. SI joints are intact. Paraspinal and other soft tissues: Negative. Disc levels: Degenerative facet disease most pronounced in the lower lumbar spine from L3-4 through L5-S1. Vacuum disc, disc space narrowing and spurring noted at L5-S1 and at the thoracolumbar junction. IMPRESSION: Left sacral fracture. Electronically Signed   By: Franky Crease M.D.   On: 10/14/2023 17:15   CT ABDOMEN PELVIS WO CONTRAST Result Date: 10/14/2023 CLINICAL DATA:  Hip trauma, fracture suspected, no prior imaging. Fall, back pain EXAM: CT ABDOMEN AND PELVIS WITHOUT CONTRAST TECHNIQUE: Multidetector CT imaging of the abdomen and pelvis was performed following the standard protocol without IV contrast. RADIATION DOSE REDUCTION: This exam was performed according to the departmental dose-optimization program which includes automated exposure control, adjustment of the mA and/or kV according to patient size and/or use of iterative reconstruction technique. COMPARISON:  09/16/2023 FINDINGS: Lower chest: Cardiomegaly. Ill-defined 1.9 cm ovoid density in the left lower lobe adjacent to the left heart border, unchanged since prior study. Surrounding ground-glass density is stable. No effusions. Coronary artery and aortic atherosclerosis. Hepatobiliary: Cholelithiasis. No CT evidence of acute cholecystitis. No focal hepatic abnormality. Pancreas: No focal abnormality or ductal dilatation. Spleen: No focal abnormality.  Normal size. Adrenals/Urinary Tract: Atrophic kidneys bilaterally no hydronephrosis. Bilateral renal low-density lesions and nonobstructing stones, unchanged. No follow-up imaging recommended. Urinary bladder unremarkable. Stomach/Bowel: Normal appendix. Stomach, large and  small bowel grossly unremarkable. Vascular/Lymphatic: Aortic atherosclerosis. No evidence of aneurysm or adenopathy. Reproductive: Uterus and adnexa unremarkable.  No mass. Other: No free fluid or free air. Musculoskeletal: Fractures through the superior and inferior pubic rami bilaterally, new since prior study. Left sacral ala fracture. IMPRESSION: Bilateral superior and inferior pubic rami fractures. Left sacral ala fracture. Coronary artery disease, aortic atherosclerosis. Cholelithiasis. Bilateral nephrolithiasis.  No hydronephrosis. Electronically Signed   By: Franky Crease M.D.   On: 10/14/2023 17:12   DG Chest 2 View Result Date: 10/14/2023 EXAM: 2 VIEW(S) XRAY OF THE CHEST 10/14/2023 04:42:00 PM COMPARISON: 09/13/2023 CLINICAL HISTORY: fall. Back pain immediately after. FINDINGS: LUNGS AND PLEURA: Central pulmonary vascular congestion. No focal pulmonary opacity. No pleural effusion. No pneumothorax. HEART AND MEDIASTINUM: Stable cardiomegaly. BONES AND SOFT TISSUES: No acute osseous abnormality. IMPRESSION: 1. Stable cardiomegaly with central pulmonary vascular congestion. Electronically signed by: Lynwood Seip MD 10/14/2023 04:46 PM EDT RP Workstation: [id number]    ROS: all other systems reviewed and are negative except as per HPI Blood pressure 128/77, pulse 64, temperature 99.3 F (37.4 C), temperature source Oral, resp. rate 18, SpO2 100%. gen nad Heent EOMI PERRL Neck no JVD Pulm clear Cv RRR Abd soft  Ext no LE edema Neuro AAO x 3 nonfocal Access L AVF + T/B  Assessment/Plan: 1 bilateral pubic rami fractures: s/p sustaining a mechanical fall.  Nonoperative management.  Per primary 2 ESRD: MWF [facility]- multiple missed and shortened rx, did not go on Friday as originally thought.  Will dialyze today off schedule and will plan to get back on schedule Monday 3 Hypertension: reasonably well controlled now 4. Anemia of ESRD: no iron  containing products, received mircera, will redose  aranesp  for today 5. Metabolic Bone Disease: on sevelamer , sensipar  with HD.  No Vit D 6.  Calciphylaxis: Na thio with dialysis, wound care.   7.  Dispo: admitted  GEARLINE NORRIS 10/15/2023, 12:13 PM

## 2023-10-16 DIAGNOSIS — N186 End stage renal disease: Secondary | ICD-10-CM | POA: Diagnosis not present

## 2023-10-16 DIAGNOSIS — S32591A Other specified fracture of right pubis, initial encounter for closed fracture: Secondary | ICD-10-CM | POA: Diagnosis not present

## 2023-10-16 DIAGNOSIS — N184 Chronic kidney disease, stage 4 (severe): Secondary | ICD-10-CM | POA: Diagnosis not present

## 2023-10-16 DIAGNOSIS — S32592A Other specified fracture of left pubis, initial encounter for closed fracture: Secondary | ICD-10-CM | POA: Diagnosis not present

## 2023-10-16 LAB — CBC
HCT: 28.3 % — ABNORMAL LOW (ref 36.0–46.0)
Hemoglobin: 8.8 g/dL — ABNORMAL LOW (ref 12.0–15.0)
MCH: 30.2 pg (ref 26.0–34.0)
MCHC: 31.1 g/dL (ref 30.0–36.0)
MCV: 97.3 fL (ref 80.0–100.0)
Platelets: 210 K/uL (ref 150–400)
RBC: 2.91 MIL/uL — ABNORMAL LOW (ref 3.87–5.11)
RDW: 15.6 % — ABNORMAL HIGH (ref 11.5–15.5)
WBC: 13.1 K/uL — ABNORMAL HIGH (ref 4.0–10.5)
nRBC: 0 % (ref 0.0–0.2)

## 2023-10-16 MED ORDER — METHOCARBAMOL 500 MG PO TABS
500.0000 mg | ORAL_TABLET | Freq: Three times a day (TID) | ORAL | Status: AC
  Administered 2023-10-16 – 2023-10-17 (×5): 500 mg via ORAL
  Filled 2023-10-16 (×5): qty 1

## 2023-10-16 MED ORDER — OXYCODONE-ACETAMINOPHEN 5-325 MG PO TABS: 1.0000 | ORAL_TABLET | ORAL | Status: DC | PRN

## 2023-10-16 MED ORDER — SODIUM THIOSULFATE 250 MG/ML IV SOLN
25.0000 g | INTRAVENOUS | Status: DC
  Administered 2023-10-17 – 2023-10-21 (×3): 25 g via INTRAVENOUS
  Filled 2023-10-16 (×3): qty 100

## 2023-10-16 MED ORDER — OXYCODONE HCL 5 MG PO TABS
5.0000 mg | ORAL_TABLET | ORAL | Status: DC | PRN
  Administered 2023-10-17 – 2023-10-20 (×6): 5 mg via ORAL
  Filled 2023-10-16 (×6): qty 1

## 2023-10-16 MED ORDER — METHOCARBAMOL 500 MG PO TABS
500.0000 mg | ORAL_TABLET | Freq: Four times a day (QID) | ORAL | Status: AC | PRN
Start: 2023-10-18 — End: ?
  Administered 2023-10-19 – 2023-10-21 (×3): 500 mg via ORAL
  Filled 2023-10-16 (×3): qty 1

## 2023-10-16 MED ORDER — SENNOSIDES-DOCUSATE SODIUM 8.6-50 MG PO TABS
2.0000 | ORAL_TABLET | Freq: Every day | ORAL | Status: DC
  Administered 2023-10-16 – 2023-10-21 (×4): 2 via ORAL
  Filled 2023-10-16 (×7): qty 2

## 2023-10-16 MED ORDER — DARBEPOETIN ALFA 100 MCG/0.5ML IJ SOSY
100.0000 ug | PREFILLED_SYRINGE | INTRAMUSCULAR | Status: DC
  Administered 2023-10-16: 100 ug via SUBCUTANEOUS
  Filled 2023-10-16: qty 0.5

## 2023-10-16 NOTE — Progress Notes (Signed)
 [facility] PROGRESS NOTE   [name] FMW:[id number] DOB: [birth year] DOA: 10/14/2023  PCP: Luvenia Sherry, FNP  Brief History: 48 y.o. 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  Patient was found to have left sacral alar fracture along with bilateral superior and inferior pubic rami fracture.  Patient was unable to ambulate.  She was hospitalized for further management.  Consultants: Dr. Barton with orthopedics  Procedures: None yet  Subjective/Interval History: Patient continues to have significant pain in her pelvic area as well as her legs from the wounds.  Underwent dialysis yesterday without any event.     Assessment/Plan:  Bilateral superior and inferior pubic rami fractures/left sacral ala fracture Orthopedic note reviewed.  Plan is for nonoperative management.  Weightbearing as tolerated.  Await PT eval.  Pain control.  This was a mechanical fall. Outpatient follow-up with orthopedics in 1-2 weeks. Pain is not well-controlled.  Will adjust her pain medications today.  She was changed over from Vicodin to Percocet since she was on oxycodone  10 mg prior to admission.  Muscle relaxants will be ordered as well.  ESRD on hemodialysis She is dialyzed on Monday Wednesday Friday schedule.  Nephrology is following. Initial impression was that she underwent dialysis on 10/10 however apparently she never showed up.  So patient was dialyzed on 10/11.  Next dialysis is on 10/13 as per her usual Monday Wednesday Friday schedule.    Chronic systolic and diastolic CHF Echocardiogram from 2024 showed LVEF of 25 to 30%.  Grade 3 diastolic dysfunction was noted.  Currently euvolemic.  Volume is managed with dialysis.   Followed by Dr. Ladona with cardiology. Continue to assess volume status closely.  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  Wound care was consulted.  Continue dressing changes as recommended by wound care.  Anemia of ESRD Stable hemoglobin noted.  Leukocytosis Most likely reactive.  She is afebrile.  WBC noted to be better.  Continue to monitor every so often.    History of asthma Stable.  Class III obesity Estimated body mass index is 40.59 kg/m as calculated from the following:   Height as of this encounter: 5' 8 (1.727 m).   Weight as of this encounter: 121.1 kg.  DVT Prophylaxis: Subcutaneous heparin  Code Status: Full code Family Communication: Discussed with patient Disposition Plan: Home health is recommended.  However patient needs to be able to go to dialysis 3 times a week.  Her mobility needs to increase a bit more before she can be safely discharged.    Medications: Scheduled:  cadexomer iodine    Topical QODAY   Chlorhexidine  Gluconate Cloth  6 each Topical Q0600   [START ON 10/17/2023] cinacalcet   180 mg Oral Q M,W,F-1800   fluticasone  furoate-vilanterol  1 puff Inhalation Daily   gabapentin   200 mg Oral QHS   heparin   5,000 Units Subcutaneous Q8H   megestrol   20 mg Oral BID   pantoprazole   40 mg Oral Daily   polyethylene glycol  17 g Oral BID   senna-docusate  2 tablet Oral QHS   sevelamer  carbonate  2,400 mg Oral TID WC   Continuous: PRN:acetaminophen  **OR** acetaminophen , albuterol , bisacodyl, diphenhydrAMINE  **OR** diphenhydrAMINE , HYDROmorphone  (DILAUDID ) injection, methocarbamol , ondansetron  **OR** ondansetron  (ZOFRAN ) IV,  oxyCODONE -acetaminophen  **AND** oxyCODONE   Objective:  Vital Signs  Vitals:   10/16/23 0137 10/16/23 0444 10/16/23 0456 10/16/23 0812  BP: 120/61 106/78 121/77 115/72  Pulse: (!) 107 92 88 95  Resp: 18 20  17   Temp: 98.5 F (36.9 C) 98.6 F (37 C)   98.1 F (36.7 C)  TempSrc:  Oral  Oral  SpO2: 96% 90% 94% 91%  Weight:      Height:        Intake/Output Summary (Last 24 hours) at 10/16/2023 9078 Last data filed at 10/15/2023 1922 Gross per 24 hour  Intake --  Output 3000 ml  Net -3000 ml    General appearance: Awake alert.  In no distress Resp: Clear to auscultation bilaterally.  Normal effort Cardio: S1-S2 is normal regular.  No S3-S4.  No rubs murmurs or bruit GI: Abdomen is soft.  Nontender nondistended.  Bowel sounds are present normal.  No masses organomegaly Extremities: Legs covered in dressing. No obvious focal neurological deficits.  Lab Results:  Data Reviewed: I have personally reviewed following labs and reports of the imaging studies  CBC: Recent Labs  Lab 10/14/23 1942 10/15/23 0920 10/16/23 0720  WBC 19.1* 13.6* 13.1*  HGB 9.3* 8.1* 8.8*  HCT 30.1* 25.5* 28.3*  MCV 99.3 97.3 97.3  PLT 290 244 210    Basic Metabolic Panel: Recent Labs  Lab 10/14/23 1942 10/15/23 0920  NA 139 136  K 3.7 4.0  CL 89* 90*  CO2 24 23  GLUCOSE 80 91  BUN 32* 33*  CREATININE 9.01* 10.19*  CALCIUM  10.7* 9.1    GFR: Estimated Creatinine Clearance: 9.2 mL/min (A) (by C-G formula based on SCr of 10.19 mg/dL (H)).    Radiology Studies: CT L-SPINE NO CHARGE Result Date: 10/14/2023 CLINICAL DATA:  Fall, back pain EXAM: CT LUMBAR SPINE WITHOUT CONTRAST TECHNIQUE: Multidetector CT imaging of the lumbar spine was performed without intravenous contrast administration. Multiplanar CT image reconstructions were also generated. RADIATION DOSE REDUCTION: This exam was performed according to the departmental dose-optimization program which includes automated exposure control, adjustment of the mA and/or kV according to patient size and/or use of iterative reconstruction technique. COMPARISON:  None Available. FINDINGS: Segmentation: 5 lumbar type vertebrae. Alignment: Normal. Vertebrae: No lumbar spine fracture. Fractures  noted through the left sacral ala, nondisplaced. SI joints are intact. Paraspinal and other soft tissues: Negative. Disc levels: Degenerative facet disease most pronounced in the lower lumbar spine from L3-4 through L5-S1. Vacuum disc, disc space narrowing and spurring noted at L5-S1 and at the thoracolumbar junction. IMPRESSION: Left sacral fracture. Electronically Signed   By: Franky Crease M.D.   On: 10/14/2023 17:15   CT ABDOMEN PELVIS WO CONTRAST Result Date: 10/14/2023 CLINICAL DATA:  Hip trauma, fracture suspected, no prior imaging. Fall, back pain EXAM: CT ABDOMEN AND PELVIS WITHOUT CONTRAST TECHNIQUE: Multidetector CT imaging of the abdomen and pelvis was performed following the standard protocol without IV contrast. RADIATION DOSE REDUCTION: This exam was performed according to the departmental dose-optimization program which includes automated exposure control, adjustment of the mA and/or kV according to patient size and/or use of iterative reconstruction technique. COMPARISON:  09/16/2023 FINDINGS: Lower chest: Cardiomegaly. Ill-defined 1.9 cm ovoid density in the left lower lobe adjacent to the left heart border, unchanged since prior study. Surrounding ground-glass density is stable. No effusions. Coronary artery and aortic atherosclerosis. Hepatobiliary: Cholelithiasis. No CT evidence of acute cholecystitis. No focal hepatic abnormality. Pancreas: No focal abnormality or ductal dilatation. Spleen: No focal abnormality.  Normal size. Adrenals/Urinary Tract: Atrophic kidneys bilaterally no hydronephrosis. Bilateral renal low-density lesions and nonobstructing stones, unchanged. No follow-up imaging recommended. Urinary bladder unremarkable. Stomach/Bowel: Normal appendix. Stomach, large and small bowel grossly unremarkable. Vascular/Lymphatic: Aortic atherosclerosis. No evidence of aneurysm or adenopathy. Reproductive: Uterus and adnexa unremarkable.  No mass. Other: No free fluid or free air.  Musculoskeletal: Fractures through the superior and inferior pubic rami bilaterally, new since prior study. Left sacral ala fracture. IMPRESSION: Bilateral superior and inferior pubic rami fractures. Left sacral ala fracture. Coronary artery disease, aortic atherosclerosis. Cholelithiasis. Bilateral nephrolithiasis.  No hydronephrosis. Electronically Signed   By: Franky Crease M.D.   On: 10/14/2023 17:12   DG Chest 2 View Result Date: 10/14/2023 EXAM: 2 VIEW(S) XRAY OF THE CHEST 10/14/2023 04:42:00 PM COMPARISON: 09/13/2023 CLINICAL HISTORY: fall. Back pain immediately after. FINDINGS: LUNGS AND PLEURA: Central pulmonary vascular congestion. No focal pulmonary opacity. No pleural effusion. No pneumothorax. HEART AND MEDIASTINUM: Stable cardiomegaly. BONES AND SOFT TISSUES: No acute osseous abnormality. IMPRESSION: 1. Stable cardiomegaly with central pulmonary vascular congestion. Electronically signed by: Lynwood Seip MD 10/14/2023 04:46 PM EDT RP Workstation: [id number]       LOS: 1 day   Joette Pebbles  [facility] Pager on [email or web address]  10/16/2023, 9:21 AM

## 2023-10-16 NOTE — Plan of Care (Signed)

## 2023-10-16 NOTE — Progress Notes (Signed)
 [facility] Progress Note   Assessment/ Plan:    Dialyzes at [facility] MWF 4 hrs 2K 2 Ca EDW 122.7 BFR450 mL/ min via AVF Sensipar  180 mg three times per week Mircera 100 mcg q 2 weeks  Sodium thiosulfate  25 g IV three times per week   Assessment/Plan: 1 bilateral pubic rami fractures: s/p sustaining a mechanical fall.  Nonoperative management.  Per primary/ ortho 2 ESRD: MWF [facility]- Dialyzed off schedule Saturday, will do back on normal schedule for Monday 3 Hypertension: reasonably well controlled now 4. Anemia of ESRD: no iron  containing products, received mircera, will redose aranesp  100 mcg q Sun 5. Metabolic Bone Disease: on sevelamer , sensipar  with HD.  No Vit D 6.  Calciphylaxis: Na thio with dialysis, wound care.   7.  Dispo: admitted  Subjective:    Seen in room.  3L off yesterday in dialysis.  Pain better, started on Dilaudid .     Objective:   BP 103/74 (BP Location: Right Arm)   Pulse 92   Temp 98.8 F (37.1 C) (Oral)   Resp 16   Ht 5' 8 (1.727 m)   Wt 121.1 kg   SpO2 100%   BMI 40.59 kg/m   Intake/Output Summary (Last 24 hours) at 10/16/2023 1312 Last data filed at 10/15/2023 1922 Gross per 24 hour  Intake --  Output 3000 ml  Net -3000 ml   Weight change:   Physical Exam: gen nad Heent EOMI PERRL Neck no JVD Pulm clear Cv RRR Abd soft  Ext no LE edema Neuro AAO x 3 nonfocal Access L AVF + T/B  Imaging: CT L-SPINE NO CHARGE Result Date: 10/14/2023 CLINICAL DATA:  Fall, back pain EXAM: CT LUMBAR SPINE WITHOUT CONTRAST TECHNIQUE: Multidetector CT imaging of the lumbar spine was performed without intravenous contrast administration. Multiplanar CT image reconstructions were also generated. RADIATION DOSE REDUCTION: This exam was performed according to the departmental dose-optimization program which includes automated exposure control, adjustment of the mA and/or kV according to patient size and/or use of iterative reconstruction  technique. COMPARISON:  None Available. FINDINGS: Segmentation: 5 lumbar type vertebrae. Alignment: Normal. Vertebrae: No lumbar spine fracture. Fractures noted through the left sacral ala, nondisplaced. SI joints are intact. Paraspinal and other soft tissues: Negative. Disc levels: Degenerative facet disease most pronounced in the lower lumbar spine from L3-4 through L5-S1. Vacuum disc, disc space narrowing and spurring noted at L5-S1 and at the thoracolumbar junction. IMPRESSION: Left sacral fracture. Electronically Signed   By: Franky Crease M.D.   On: 10/14/2023 17:15   CT ABDOMEN PELVIS WO CONTRAST Result Date: 10/14/2023 CLINICAL DATA:  Hip trauma, fracture suspected, no prior imaging. Fall, back pain EXAM: CT ABDOMEN AND PELVIS WITHOUT CONTRAST TECHNIQUE: Multidetector CT imaging of the abdomen and pelvis was performed following the standard protocol without IV contrast. RADIATION DOSE REDUCTION: This exam was performed according to the departmental dose-optimization program which includes automated exposure control, adjustment of the mA and/or kV according to patient size and/or use of iterative reconstruction technique. COMPARISON:  09/16/2023 FINDINGS: Lower chest: Cardiomegaly. Ill-defined 1.9 cm ovoid density in the left lower lobe adjacent to the left heart border, unchanged since prior study. Surrounding ground-glass density is stable. No effusions. Coronary artery and aortic atherosclerosis. Hepatobiliary: Cholelithiasis. No CT evidence of acute cholecystitis. No focal hepatic abnormality. Pancreas: No focal abnormality or ductal dilatation. Spleen: No focal abnormality.  Normal size. Adrenals/Urinary Tract: Atrophic kidneys bilaterally no hydronephrosis. Bilateral renal low-density lesions and nonobstructing stones, unchanged. No  follow-up imaging recommended. Urinary bladder unremarkable. Stomach/Bowel: Normal appendix. Stomach, large and small bowel grossly unremarkable. Vascular/Lymphatic:  Aortic atherosclerosis. No evidence of aneurysm or adenopathy. Reproductive: Uterus and adnexa unremarkable.  No mass. Other: No free fluid or free air. Musculoskeletal: Fractures through the superior and inferior pubic rami bilaterally, new since prior study. Left sacral ala fracture. IMPRESSION: Bilateral superior and inferior pubic rami fractures. Left sacral ala fracture. Coronary artery disease, aortic atherosclerosis. Cholelithiasis. Bilateral nephrolithiasis.  No hydronephrosis. Electronically Signed   By: Franky Crease M.D.   On: 10/14/2023 17:12   DG Chest 2 View Result Date: 10/14/2023 EXAM: 2 VIEW(S) XRAY OF THE CHEST 10/14/2023 04:42:00 PM COMPARISON: 09/13/2023 CLINICAL HISTORY: fall. Back pain immediately after. FINDINGS: LUNGS AND PLEURA: Central pulmonary vascular congestion. No focal pulmonary opacity. No pleural effusion. No pneumothorax. HEART AND MEDIASTINUM: Stable cardiomegaly. BONES AND SOFT TISSUES: No acute osseous abnormality. IMPRESSION: 1. Stable cardiomegaly with central pulmonary vascular congestion. Electronically signed by: Lynwood Seip MD 10/14/2023 04:46 PM EDT RP Workstation: [id number]    Labs: BMET Recent Labs  Lab 10/14/23 1942 10/15/23 0920  NA 139 136  K 3.7 4.0  CL 89* 90*  CO2 24 23  GLUCOSE 80 91  BUN 32* 33*  CREATININE 9.01* 10.19*  CALCIUM  10.7* 9.1   CBC Recent Labs  Lab 10/14/23 1942 10/15/23 0920 10/16/23 0720  WBC 19.1* 13.6* 13.1*  HGB 9.3* 8.1* 8.8*  HCT 30.1* 25.5* 28.3*  MCV 99.3 97.3 97.3  PLT 290 244 210    Medications:     cadexomer iodine    Topical QODAY   Chlorhexidine  Gluconate Cloth  6 each Topical Q0600   [START ON 10/17/2023] cinacalcet   180 mg Oral Q M,W,F-1800   fluticasone  furoate-vilanterol  1 puff Inhalation Daily   gabapentin   200 mg Oral QHS   heparin   5,000 Units Subcutaneous Q8H   megestrol   20 mg Oral BID   methocarbamol   500 mg Oral TID   pantoprazole   40 mg Oral Daily   polyethylene glycol  17 g  Oral BID   senna-docusate  2 tablet Oral QHS   sevelamer  carbonate  2,400 mg Oral TID WC   Almarie Bonine MD 10/16/2023, 1:12 PM

## 2023-10-16 NOTE — Plan of Care (Signed)

## 2023-10-17 DIAGNOSIS — S32592A Other specified fracture of left pubis, initial encounter for closed fracture: Secondary | ICD-10-CM | POA: Diagnosis not present

## 2023-10-17 DIAGNOSIS — S32591A Other specified fracture of right pubis, initial encounter for closed fracture: Secondary | ICD-10-CM | POA: Diagnosis not present

## 2023-10-17 LAB — CBC
HCT: 27.8 % — ABNORMAL LOW (ref 36.0–46.0)
Hemoglobin: 8.9 g/dL — ABNORMAL LOW (ref 12.0–15.0)
MCH: 30.6 pg (ref 26.0–34.0)
MCHC: 32 g/dL (ref 30.0–36.0)
MCV: 95.5 fL (ref 80.0–100.0)
Platelets: 251 K/uL (ref 150–400)
RBC: 2.91 MIL/uL — ABNORMAL LOW (ref 3.87–5.11)
RDW: 15.4 % (ref 11.5–15.5)
WBC: 13.2 K/uL — ABNORMAL HIGH (ref 4.0–10.5)
nRBC: 0 % (ref 0.0–0.2)

## 2023-10-17 LAB — BASIC METABOLIC PANEL WITH GFR
Anion gap: 18 — ABNORMAL HIGH (ref 5–15)
BUN: 32 mg/dL — ABNORMAL HIGH (ref 6–20)
CO2: 23 mmol/L (ref 22–32)
Calcium: 9.5 mg/dL (ref 8.9–10.3)
Chloride: 92 mmol/L — ABNORMAL LOW (ref 98–111)
Creatinine, Ser: 8.95 mg/dL — ABNORMAL HIGH (ref 0.44–1.00)
GFR, Estimated: 5 mL/min — ABNORMAL LOW (ref >60)
Glucose, Bld: 89 mg/dL (ref 70–99)
Potassium: 4.6 mmol/L (ref 3.5–5.1)
Sodium: 133 mmol/L — ABNORMAL LOW (ref 135–145)

## 2023-10-17 LAB — HEPATITIS B SURFACE ANTIBODY, QUANTITATIVE: Hep B S AB Quant (Post): 98.6 m[IU]/mL

## 2023-10-17 MED ORDER — NEPRO/CARBSTEADY PO LIQD
237.0000 mL | Freq: Two times a day (BID) | ORAL | Status: DC
Start: 2023-10-17 — End: 2023-10-22
  Administered 2023-10-21: 237 mL via ORAL

## 2023-10-17 NOTE — Plan of Care (Signed)

## 2023-10-17 NOTE — Progress Notes (Signed)
 Attempted dressing change this morning. Medication not on the unit to complete the change. Message was sent to the pharm twice with a response that they would be tubing shortly. Patient stated that she had an outpatient appointment tomorrow at the wound center to get bilateral legs debrided. Due to her current stay, she cancelled  that appointment. Patient was asking if the [facility] nurse here could debrid the bilateral wounds. Consult added and WOC came to bedside, unwrapped the wound and stated that she would not be able to do that and educated the patient that when she gets discharged she needs to follow back up with the wound center. WOC nurse cleaned the wound with wound cleanser and wrapped back up. Again, she wasn't able to do complete dressing change due to medication not available at this time.

## 2023-10-17 NOTE — Consult Note (Signed)
 WOC Nurse Consult Note: Reason for Consult: wound re-evaluation Wound type: BLE full thickness wounds r/t calciphylaxis  Pressure Injury POA: NA Measurement: see nursing flow sheets/ prior WOC note dated 10/10 Wound bed: l leg 50% eschar 30% tan 20% red; R leg 50% tan 50% eschar  Drainage (amount, consistency, odor) foul purulent drainage Periwound: intact Dressing procedure/placement/frequency:  Will maintain current wound care orders as follows:  Cleanse B lower leg wounds with Vashe wound cleanser, do not rinse and allow to air dry. Apply Cadexomer Iodine  to wound beds every other day, cover with ABD pads and secure with Kerlix roll gauze beginning right above toes and ending right below knees. Apply Ace bandage in same fashion as Kerlix for light compression.   Discussed with bedside RN who reports she has requested the Cadexomer Iodine  from pharmacy at 0830 this am, is still awaiting delivery at this time.    WOC team will not follow patient at this time, please re consult if new needs arise.   Thank you,  Doyal Polite, MSN, RN, [facility] WOC Team [phone] (Available Mon-Fri 0700-1500)

## 2023-10-17 NOTE — Progress Notes (Signed)
 PROGRESS NOTE [name]  FMW:[id number] DOB: 02-Jan-1976 DOA: 10/14/2023 PCP: Luvenia Sherry, FNP  Brief Narrative/Hospital Course: [name] is a 48 y.o. 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 Patient was found to have left sacral alar fracture along with bilateral superior and inferior pubic rami fracture. Patient was unable to ambulate. She was hospitalized for further management.   Subjective: Seen and examined today Overnight afebrile BP stable on room air.  Labs reviewed from 10/12- stable with chronic anemia On the bedside chair, waiting for dialysis Has not ambulated in the hallway Worked with PT OT today and wants to work after dialysis to see if she could walk.  Assessment and plan:  B/L superior and inferior pubic rami fracture left sacral ala fracture Mechanical fall: Per orthopedic, continue nonoperative management, WBAT PT OT pain management and f/u  w/ orthopedics in 1-2 weeks.Optimizing pain management:changed over from Vicodin to Percocet since she was on oxycodone  10 mg PTA, continue muscle relaxant, gabapentin  at bedtime, bowel regimen.  Has been needing IV Dilaudid  she is on 0.5 to 1 mg every 3 PRN.   ESRD on HD mwf Metabolic bone disease Calciphylaxis: Nephrology is following.  Continue HD per schedule.,  On sevelamer  and Sensipar  with HD no vitamin D .  She is on sodium thiosulfate  dialysis for calciphylaxis along with wound care.   Chronic systolic and diastolic CHF TTE 7975-OCZQ of 25 to 30%, G3DD.Currently euvolemic,Managed with hd Followed by Dr. Ladona with cardiology.   Calciphylaxis with chronic ulcerative wounds to bilateral lower extremities: Continue wound care   Anemia of ESRD Hb stable, continue aranesp ,  monitor hemoglobin   Leukocytosis Most likely reactive.  She is afebrile.  Trending down    History of asthma Stable.  Morbid Obesity w/ Body mass index is 40.59 kg/m.: Will benefit with PCP follow-up, weight loss,healthy lifestyle and outpatient sleep eval if not done.  Mobility: PT Orders: Active PT Follow up Rec: Home Health Pt10/11/2023 1500   DVT prophylaxis: heparin  injection 5,000 Units Start: 10/14/23 2200 Code Status:   Code Status: Full Code Family Communication: plan of care discussed with patient at bedside. Patient status is: Remains hospitalized because of severity of illness Level of care: Med-Surg   Dispo: The patient is from: home at baseline.            Anticipated disposition: Home with home health   Objective: Vitals last 24 hrs: Vitals:   10/17/23 0429 10/17/23 0719 10/17/23 0740 10/17/23 1201  BP: 139/87 (!) 136/92  107/69  Pulse: 90 88  83  Resp: 15     Temp: 97.6 F (36.4 C) 98.2 F (36.8 C)  98.3 F (36.8 C)  TempSrc:  Oral  Oral  SpO2: 97% 93% 96% 97%  Weight:      Height:        Physical Examination: General exam: alert awake, oriented, older than stated age HEENT:Oral mucosa moist, Ear/Nose WNL grossly Respiratory system: Bilaterally clear BS,no use of accessory muscle Cardiovascular system: S1 & S2 +, No JVD. Gastrointestinal system: Abdomen soft,NT,ND, BS+ Nervous System: Alert, awake, moving all extremities,and following commands. Extremities: extremities warm, bilateral leg with dressing in place Skin: No rashes,no icterus. MSK: Normal muscle bulk,tone, power   Medications reviewed:  Scheduled Meds:  cadexomer iodine   Topical QODAY   Chlorhexidine  Gluconate Cloth  6 each Topical Q0600   cinacalcet   180 mg Oral Q M,W,F-1800   darbepoetin (ARANESP ) injection - DIALYSIS  100 mcg Subcutaneous Q Sun-1800   feeding supplement (NEPRO CARB STEADY)  237 mL Oral BID BM   fluticasone  furoate-vilanterol  1 puff Inhalation Daily    gabapentin   200 mg Oral QHS   heparin   5,000 Units Subcutaneous Q8H   megestrol   20 mg Oral BID   methocarbamol   500 mg Oral TID   pantoprazole   40 mg Oral Daily   polyethylene glycol  17 g Oral BID   senna-docusate  2 tablet Oral QHS   sevelamer  carbonate  2,400 mg Oral TID WC   Continuous Infusions:  sodium thiosulfate  25 g in sodium chloride  0.9 % 200 mL Infusion for Calciphylaxis     Diet: Diet Order             Diet renal with fluid restriction Fluid restriction: 1200 mL Fluid; Room service appropriate? Yes; Fluid consistency: Thin  Diet effective now                    Data Reviewed: I have personally reviewed following labs and imaging studies ( see epic result tab) CBC: Recent Labs  Lab 10/14/23 1942 10/15/23 0920 10/16/23 0720 10/17/23 0942  WBC 19.1* 13.6* 13.1* 13.2*  HGB 9.3* 8.1* 8.8* 8.9*  HCT 30.1* 25.5* 28.3* 27.8*  MCV 99.3 97.3 97.3 95.5  PLT 290 244 210 251   CMP: Recent Labs  Lab 10/14/23 1942 10/15/23 0920 10/17/23 0942  NA 139 136 133*  K 3.7 4.0 4.6  CL 89* 90* 92*  CO2 24 23 23   GLUCOSE 80 91 89  BUN 32* 33* 32*  CREATININE 9.01* 10.19* 8.95*  CALCIUM  10.7* 9.1 9.5   GFR: Estimated Creatinine Clearance: 10.4 mL/min (A) (by C-G formula based on SCr of 8.95 mg/dL (H)). No results for input(s): AST, ALT, ALKPHOS, BILITOT, PROT, ALBUMIN in the last 168 hours. No results for input(s): LIPASE, AMYLASE in the last 168 hours. No results for input(s): AMMONIA in the last 168 hours. Coagulation Profile: No results for input(s): INR, PROTIME in the last 168 hours. Unresulted Labs (From admission, onward)     Start     Ordered   10/15/23 1233  Hepatitis B surface antibody,quantitative  (New Admission Hemo Labs (Hepatitis B))  Once,   R        10/15/23 1233   10/15/23 0649  Urinalysis, Routine w reflex microscopic -Urine, Clean Catch  Once,   R       Question:  Specimen Source  Answer:  Urine, Clean Catch   10/15/23 0648            Antimicrobials/Microbiology: Anti-infectives (From admission, onward)    None         Component Value Date/Time   SDES URINE, CLEAN CATCH 02/20/2008 0755   SPECREQUEST NONE 02/20/2008 0755   CULT  02/20/2008 0755    Multiple bacterial morphotypes present, none predominant. Suggest appropriate recollection if clinically indicated.   REPTSTATUS 02/21/2008 FINAL 02/20/2008 0755    Procedures:    Mennie LAMY, MD [facility] 10/17/2023, 1:58 PM
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
## 2023-10-17 NOTE — Progress Notes (Signed)
   10/17/23 1835  Vitals  Temp 97.7 F (36.5 C)  Pulse Rate 86  Resp 15  BP 123/83  SpO2 100 %  O2 Device Room Air  Oxygen Therapy  Patient Activity (if Appropriate) In bed  Pulse Oximetry Type Continuous  Oximetry Probe Site Changed No  Post Treatment  Dialyzer Clearance Lightly streaked  Hemodialysis Intake (mL) 0 mL  Liters Processed 68.4  Fluid Removed (mL) 3000 mL  Tolerated HD Treatment Yes  AVG/AVF Arterial Site Held (minutes) 5 minutes  AVG/AVF Venous Site Held (minutes) 5 minutes   Received patient in bed to unit.  Alert and oriented.  Informed consent signed and in chart.   TX duration:3.0 per md order  Patient tolerated well.  Transported back to the room  Alert, without acute distress.  Hand-off given to patient's nurse.   Access used: LUAF Access issues: no complications  Total UF removed: 3000 Medication(s) given: Na thio IV   Delon LITT[facility]

## 2023-10-17 NOTE — Progress Notes (Signed)
 PT Cancellation Note  Patient Details Name: [name] MRN: [id number] DOB: Jan 29, 1975   Cancelled Treatment:    Reason Eval/Treat Not Completed: (P) Patient at procedure or test/unavailable (pt went to HD dept ~2:45pm.) Will continue efforts next date per PT plan of care as schedule permits to reassess safe disposition plan.   Connell HERO Tia Gelb 10/17/2023, 3:25 PM

## 2023-10-17 NOTE — Progress Notes (Signed)
 DAP note Client name: [name] Therapist name: Camie Norris [name] SILK Date: 09/01/2023 Time: 60 mins  DATA: Client reports that the vein doctor has been really helpful. She reports that they are wrapping her leg and whatever they are putting on it before they wrap it feels so good. She states that she is still in really bad pain and does not want to get addicted to the pain pills. Client states that she really just hopes that it heals up soon. Client reports that her depression level is at a 7 this week and would like to try and get it down to a 2 with continued use of interventions.   ASSESSMENT: Client was still visibly in pain today and it was hard for her to walk up the stairs. She continues to have to work and push through but it is wearing on her. She is worried about the pills and is trying not to take them but is then in horrible pain trying to work. It is not only taking a toll on her physically but also mentally.    PLAN: Client will attend therapy bi-weekly, with a goal of consistently attending 2 sessions per month. Client will attend dialysis 3 times per week with a goal of 3 times per week to maintain her best physical health. Client will take a moment of mindfulness each day and utilize box breathing as interventions to help with the decrease in depression. Client will check in daily with herself to evaluate her daily needs and try to maintain those needs for her overall best health.

## 2023-10-17 NOTE — Progress Notes (Signed)
 [facility] Progress Note  Subjective:    Vitals:   10/16/23 2152 10/17/23 0429 10/17/23 0719 10/17/23 0740  BP: 122/77 139/87 (!) 136/92   Pulse: 92 90 88   Resp: 20 15    Temp: 99 F (37.2 C) 97.6 F (36.4 C) 98.2 F (36.8 C)   TempSrc:   Oral   SpO2: 95% 97% 93% 96%  Weight:      Height:        Exam: gen nad Heent EOMI PERRL Neck no JVD Pulm clear Cv RRR Abd soft , ntnd Ext no LE edema, legs wrapped Neuro AAO x 3 nonfocal Access L AVF + T/B    OP HD: [facility] MWF  4 hrs 2K/2Ca   122.7kg  B450  AVF  heparin  ? Sensipar  180 mg three times per week Mircera 100 mcg q 2 weeks Sodium thiosulfate  25 g IV three times per week    Assessment/ Plan: Bilateral pubic rami fractures: s/p sustaining a mechanical fall.  Nonoperative management.  Per primary/ ortho ESRD: MWF HD. Had HD here Sat, plan next HD today Hypertension: reasonably well controlled now Anemia of ESRD: no iron  containing products. Received mircera, will redose aranesp  100 mcg q Sun Metabolic Bone Disease: on sevelamer , sensipar ; no vit D.  Calciphylaxis: Na thio with dialysis, wound care.       Myer Fret MD  [facility] 10/17/2023, 9:19 AM  Recent Labs  Lab 10/14/23 1942 10/15/23 0920 10/16/23 0720  HGB 9.3* 8.1* 8.8*  CALCIUM  10.7* 9.1  --   CREATININE 9.01* 10.19*  --   K 3.7 4.0  --    No results for input(s): IRON , TIBC, FERRITIN in the last 168 hours. Inpatient medications:  cadexomer iodine    Topical QODAY   Chlorhexidine  Gluconate Cloth  6 each Topical Q0600   cinacalcet   180 mg Oral Q M,W,F-1800   darbepoetin (ARANESP ) injection - DIALYSIS  100 mcg Subcutaneous Q Sun-1800   feeding supplement (NEPRO CARB STEADY)  237 mL Oral BID BM   fluticasone  furoate-vilanterol  1 puff Inhalation Daily   gabapentin   200 mg Oral QHS   heparin   5,000 Units Subcutaneous Q8H   megestrol   20 mg Oral BID   methocarbamol   500 mg Oral TID   pantoprazole   40 mg Oral Daily   polyethylene  glycol  17 g Oral BID   senna-docusate  2 tablet Oral QHS   sevelamer  carbonate  2,400 mg Oral TID WC    sodium thiosulfate  25 g in sodium chloride  0.9 % 200 mL Infusion for Calciphylaxis     acetaminophen  **OR** acetaminophen , albuterol , bisacodyl, diphenhydrAMINE  **OR** diphenhydrAMINE , HYDROmorphone  (DILAUDID ) injection, methocarbamol  **FOLLOWED BY** [START ON 10/18/2023] methocarbamol , ondansetron  **OR** ondansetron  (ZOFRAN ) IV, oxyCODONE -acetaminophen  **AND** oxyCODONE 

## 2023-10-17 NOTE — Progress Notes (Signed)
 Pt receives out-pt HD at [facility], MWF, 0700 chair time. Will continue to assist.   Lavanda Fredrickson Dialysis Navigator [phone]

## 2023-10-18 ENCOUNTER — Ambulatory Visit (HOSPITAL_BASED_OUTPATIENT_CLINIC_OR_DEPARTMENT_OTHER): Admitting: General Surgery

## 2023-10-18 DIAGNOSIS — S32592A Other specified fracture of left pubis, initial encounter for closed fracture: Secondary | ICD-10-CM | POA: Diagnosis not present

## 2023-10-18 DIAGNOSIS — S32591A Other specified fracture of right pubis, initial encounter for closed fracture: Secondary | ICD-10-CM | POA: Diagnosis not present

## 2023-10-18 LAB — GLUCOSE, CAPILLARY: Glucose-Capillary: 90 mg/dL (ref 70–99)

## 2023-10-18 MED ORDER — CHLORHEXIDINE GLUCONATE CLOTH 2 % EX PADS: 6.0000 | MEDICATED_PAD | Freq: Every day | CUTANEOUS | Status: DC

## 2023-10-18 NOTE — Plan of Care (Signed)
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
## 2023-10-18 NOTE — Plan of Care (Signed)
  Problem: Education: Goal: Knowledge of General Education information will improve Description: Including pain rating scale, medication(s)/side effects and non-pharmacologic comfort measures 10/18/2023 1746 by Marolyn Charmaine HERO, LPN Outcome: Progressing 10/18/2023 1746 by Marolyn Charmaine HERO, LPN Outcome: Progressing   Problem: Health Behavior/Discharge Planning: Goal: Ability to manage health-related needs will improve 10/18/2023 1746 by Marolyn Charmaine HERO, LPN Outcome: Progressing 10/18/2023 1746 by Marolyn Charmaine HERO, LPN Outcome: Progressing   Problem: Clinical Measurements: Goal: Ability to maintain clinical measurements within normal limits will improve 10/18/2023 1746 by Marolyn Charmaine HERO, LPN Outcome: Progressing 10/18/2023 1746 by Marolyn Charmaine HERO, LPN Outcome: Progressing Goal: Will remain free from infection 10/18/2023 1746 by Marolyn Charmaine HERO, LPN Outcome: Progressing 10/18/2023 1746 by Marolyn Charmaine HERO, LPN Outcome: Progressing Goal: Diagnostic test results will improve 10/18/2023 1746 by Marolyn Charmaine HERO, LPN Outcome: Progressing 10/18/2023 1746 by Marolyn Charmaine HERO, LPN Outcome: Progressing Goal: Respiratory complications will improve 10/18/2023 1746 by Marolyn Charmaine HERO, LPN Outcome: Progressing 10/18/2023 1746 by Marolyn Charmaine HERO, LPN Outcome: Progressing Goal: Cardiovascular complication will be avoided 10/18/2023 1746 by Marolyn Charmaine HERO, LPN Outcome: Progressing 10/18/2023 1746 by Marolyn Charmaine HERO, LPN Outcome: Progressing   Problem: Activity: Goal: Risk for activity intolerance will decrease 10/18/2023 1746 by Marolyn Charmaine HERO, LPN Outcome: Progressing 10/18/2023 1746 by Marolyn Charmaine HERO, LPN Outcome: Progressing   Problem: Nutrition: Goal: Adequate nutrition will be maintained 10/18/2023 1746 by Marolyn Charmaine HERO, LPN Outcome: Progressing 10/18/2023 1746 by Marolyn Charmaine HERO, LPN Outcome: Progressing   Problem:  Coping: Goal: Level of anxiety will decrease 10/18/2023 1746 by Marolyn Charmaine HERO, LPN Outcome: Progressing 10/18/2023 1746 by Marolyn Charmaine HERO, LPN Outcome: Progressing   Problem: Elimination: Goal: Will not experience complications related to bowel motility 10/18/2023 1746 by Marolyn Charmaine HERO, LPN Outcome: Progressing 10/18/2023 1746 by Marolyn Charmaine HERO, LPN Outcome: Progressing Goal: Will not experience complications related to urinary retention 10/18/2023 1746 by Marolyn Charmaine HERO, LPN Outcome: Progressing 10/18/2023 1746 by Marolyn Charmaine HERO, LPN Outcome: Progressing   Problem: Pain Managment: Goal: General experience of comfort will improve and/or be controlled 10/18/2023 1746 by Marolyn Charmaine HERO, LPN Outcome: Progressing 10/18/2023 1746 by Marolyn Charmaine HERO, LPN Outcome: Progressing   Problem: Safety: Goal: Ability to remain free from injury will improve 10/18/2023 1746 by Marolyn Charmaine HERO, LPN Outcome: Progressing 10/18/2023 1746 by Marolyn Charmaine HERO, LPN Outcome: Progressing   Problem: Skin Integrity: Goal: Risk for impaired skin integrity will decrease 10/18/2023 1746 by Marolyn Charmaine HERO, LPN Outcome: Progressing 10/18/2023 1746 by Marolyn Charmaine HERO, LPN Outcome: Progressing

## 2023-10-18 NOTE — Progress Notes (Signed)
 PROGRESS NOTE [name]  FMW:[id number] DOB: 1975-09-19 DOA: 10/14/2023 PCP: Luvenia Sherry, FNP  Brief Narrative/Hospital Course: [name] is a 48 y.o. 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 Patient was found to have left sacral alar fracture along with bilateral superior and inferior pubic rami fracture. Patient was unable to ambulate. She was hospitalized for further management.   Subjective: Seen and examined today Overnight afebrile BP stable on room air.  Labs reviewed from 10/12- stable with chronic anemia On the bedside chair, waiting for dialysis Reports she worked with PT OT, she is not sure, she wants to think about going to skilled nursing facility-bit hesitant to go for 20 days.  Wants to work with PT again  Assessment and plan:  B/L superior and inferior pubic rami fracture left sacral ala fracture Mechanical fall: Per orthopedic, continue nonoperative management, WBAT PT OT pain management and f/u  w/ Dr Barton in 3 wk Optimizing pain management:changed over from Vicodin to Percocet since she was on oxycodone  10 mg PTA, continue muscle relaxant, gabapentin , bowel regimen and prn Dilaudid    ESRD on HD mwf Metabolic bone disease Calciphylaxis: Nephrology is following.  S/p HD 10/13. Cont sevelamer  and Sensipar  with HD no vitamin D .  She is on sodium thiosulfate  dialysis for calciphylaxis along with wound care.   Chronic systolic and diastolic CHF TTE 7975-[id number] of 25 to 30%, G3DD.Currently euvolemic,Managed with hd Followed by Dr. Ladona with cardiology.   Calciphylaxis with chronic ulcerative wounds to bilateral lower extremities: Continue wound care   Anemia of ESRD Hb stable, continue aranesp , monitor hemoglobin    Leukocytosis Most likely reactive.  She is afebrile.  Trending down    History of asthma Stable.  Morbid Obesity w/ Body mass index is 40.26 kg/m.: Will benefit with PCP follow-up, weight loss,healthy lifestyle and outpatient sleep eval if not done.  Mobility: PT Orders: Active PT Follow up Rec: Home Health Pt10/11/2023 1500   DVT prophylaxis: heparin  injection 5,000 Units Start: 10/14/23 2200 Code Status:   Code Status: Full Code Family Communication: plan of care discussed with patient at bedside. Patient status is: Remains hospitalized because of severity of illness Level of care: Med-Surg   Dispo: The patient is from: home at baseline.            Anticipated disposition: SNF vs HH in 1 day  Objective: Vitals last 24 hrs: Vitals:   10/18/23 0106 10/18/23 0632 10/18/23 0746 10/18/23 0842  BP: 122/65 126/74  128/82  Pulse: 86 89  85  Resp: 20 20  18   Temp: 98.4 F (36.9 C) 98.6 F (37 C)  98.8 F (37.1 C)  TempSrc:      SpO2: 99%  95% 96%  Weight:      Height:        Physical Examination: General exam: aaox3 HEENT:Oral mucosa moist, Ear/Nose WNL grossly Respiratory system: CTA B/L, no use of accessory muscle Cardiovascular system: S1 & S2 +, No JVD. Gastrointestinal system: Abdomen soft,NT,ND, BS+ Nervous System: Alert, awake, moving all extremities,and following commands. Extremities: extremities warm, bilateral leg w/ ace wrap dressing intact Skin: No rashes,no icterus. MSK: Normal muscle bulk,tone, power   Medications reviewed:  Scheduled Meds:  cadexomer iodine    Topical QODAY   Chlorhexidine  Gluconate Cloth  6  each Topical Q0600   cinacalcet   180 mg Oral Q M,W,F-1800   darbepoetin (ARANESP ) injection - DIALYSIS  100 mcg Subcutaneous Q Sun-1800   feeding supplement (NEPRO CARB STEADY)  237 mL Oral BID BM   fluticasone  furoate-vilanterol  1 puff Inhalation Daily   gabapentin   200 mg Oral QHS   heparin   5,000 Units Subcutaneous Q8H   megestrol   20 mg  Oral BID   pantoprazole   40 mg Oral Daily   polyethylene glycol  17 g Oral BID   senna-docusate  2 tablet Oral QHS   sevelamer  carbonate  2,400 mg Oral TID WC   Continuous Infusions:  sodium thiosulfate  25 g in sodium chloride  0.9 % 200 mL Infusion for Calciphylaxis 25 g (10/17/23 1700)   Diet: Diet Order             Diet renal with fluid restriction Fluid restriction: 1200 mL Fluid; Room service appropriate? Yes; Fluid consistency: Thin  Diet effective now                    Data Reviewed: I have personally reviewed following labs and imaging studies ( see epic result tab) CBC: Recent Labs  Lab 10/14/23 1942 10/15/23 0920 10/16/23 0720 10/17/23 0942  WBC 19.1* 13.6* 13.1* 13.2*  HGB 9.3* 8.1* 8.8* 8.9*  HCT 30.1* 25.5* 28.3* 27.8*  MCV 99.3 97.3 97.3 95.5  PLT 290 244 210 251   CMP: Recent Labs  Lab 10/14/23 1942 10/15/23 0920 10/17/23 0942  NA 139 136 133*  K 3.7 4.0 4.6  CL 89* 90* 92*  CO2 24 23 23   GLUCOSE 80 91 89  BUN 32* 33* 32*  CREATININE 9.01* 10.19* 8.95*  CALCIUM  10.7* 9.1 9.5   GFR: Estimated Creatinine Clearance: 10.4 mL/min (A) (by C-G formula based on SCr of 8.95 mg/dL (H)). No results for input(s): AST, ALT, ALKPHOS, BILITOT, PROT, ALBUMIN in the last 168 hours. No results for input(s): LIPASE, AMYLASE in the last 168 hours. No results for input(s): AMMONIA in the last 168 hours. Coagulation Profile: No results for input(s): INR, PROTIME in the last 168 hours. Unresulted Labs (From admission, onward)     Start     Ordered   10/15/23 9350  Urinalysis, Routine w reflex microscopic -Urine, Clean Catch  Once,   R       Question:  Specimen Source  Answer:  Urine, Clean Catch   10/15/23 0648           Antimicrobials/Microbiology: Anti-infectives (From admission, onward)    None         Component Value Date/Time   SDES URINE, CLEAN CATCH 02/20/2008 0755   SPECREQUEST NONE 02/20/2008 0755   CULT  02/20/2008  0755    Multiple bacterial morphotypes present, none predominant. Suggest appropriate recollection if clinically indicated.   REPTSTATUS 02/21/2008 FINAL 02/20/2008 0755    Procedures:    Mennie LAMY, MD [facility] 10/18/2023, 1:24 PM

## 2023-10-18 NOTE — Progress Notes (Signed)
 [facility] Progress Note   Subjective:    Seen and examined patient at bedside. She reports feeling depressed about her overall mobility issues. Denies SOB, CP, and N/V. Tolerated yesterday's HD with net UF 3L. Next HD 10/15. Patient may go home tomorrow per Hospitalist.  Objective Vitals:   10/18/23 9367 10/18/23 0746 10/18/23 0842 10/18/23 1215  BP: 126/74  128/82 113/81  Pulse: 89  85 83  Resp: 20  18 18   Temp: 98.6 F (37 C)  98.8 F (37.1 C) 98.5 F (36.9 C)  TempSrc:      SpO2:  95% 96% 100%  Weight:      Height:       Physical Exam General: Awake, alert, NAD, obese Heart: S1 and S2; No murmurs, gallops, or rubs Lungs: Clear throughout; No wheezing, rales, or rhonchi Abdomen: Large, soft, and non-tender Extremities: No LE edema; legs wrapped 2nd calciphylaxis Dialysis Access: L AVF   Filed Weights   10/16/23 0111 10/17/23 1450  Weight: 121.1 kg 120.1 kg    Intake/Output Summary (Last 24 hours) at 10/18/2023 1433 Last data filed at 10/17/2023 2030 Gross per 24 hour  Intake 637 ml  Output 3000 ml  Net -2363 ml    Additional Objective Labs: Basic Metabolic Panel: Recent Labs  Lab 10/14/23 1942 10/15/23 0920 10/17/23 0942  NA 139 136 133*  K 3.7 4.0 4.6  CL 89* 90* 92*  CO2 24 23 23   GLUCOSE 80 91 89  BUN 32* 33* 32*  CREATININE 9.01* 10.19* 8.95*  CALCIUM  10.7* 9.1 9.5   Liver Function Tests: No results for input(s): AST, ALT, ALKPHOS, BILITOT, PROT, ALBUMIN in the last 168 hours. No results for input(s): LIPASE, AMYLASE in the last 168 hours. CBC: Recent Labs  Lab 10/14/23 1942 10/15/23 0920 10/16/23 0720 10/17/23 0942  WBC 19.1* 13.6* 13.1* 13.2*  HGB 9.3* 8.1* 8.8* 8.9*  HCT 30.1* 25.5* 28.3* 27.8*  MCV 99.3 97.3 97.3 95.5  PLT 290 244 210 251   Blood Culture    Component Value Date/Time   SDES URINE, CLEAN CATCH 02/20/2008 0755   SPECREQUEST NONE 02/20/2008 0755   CULT  02/20/2008 0755     Multiple bacterial morphotypes present, none predominant. Suggest appropriate recollection if clinically indicated.   REPTSTATUS 02/21/2008 FINAL 02/20/2008 0755    Cardiac Enzymes: No results for input(s): CKTOTAL, CKMB, CKMBINDEX, TROPONINI in the last 168 hours. CBG: Recent Labs  Lab 10/18/23 1212  GLUCAP 90   Iron  Studies: No results for input(s): IRON , TIBC, TRANSFERRIN, FERRITIN in the last 72 hours. Lab Results  Component Value Date   INR 1.3 (H) 09/13/2023   INR 1.21 02/28/2014   INR 1.19 01/28/2013   Studies/Results: No results found.  Medications:  sodium thiosulfate  25 g in sodium chloride  0.9 % 200 mL Infusion for Calciphylaxis 25 g (10/17/23 1700)    cadexomer iodine    Topical QODAY   Chlorhexidine  Gluconate Cloth  6 each Topical Q0600   cinacalcet   180 mg Oral Q M,W,F-1800   darbepoetin (ARANESP ) injection - DIALYSIS  100 mcg Subcutaneous Q Sun-1800   feeding supplement (NEPRO CARB STEADY)  237 mL Oral BID BM   fluticasone  furoate-vilanterol  1 puff Inhalation Daily   gabapentin   200 mg Oral QHS   heparin   5,000 Units Subcutaneous Q8H   megestrol   20 mg Oral BID   pantoprazole   40 mg Oral Daily   polyethylene glycol  17 g Oral BID   senna-docusate  2 tablet  Oral QHS   sevelamer  carbonate  2,400 mg Oral TID WC    Dialysis Orders: [facility] MWF  4 hrs 2K/2Ca   122.7kg  B450  AVF  heparin  ? Sensipar  180 mg three times per week Mircera 100 mcg q 2 weeks Sodium thiosulfate  25 g IV three times per week  Assessment/Plan: Bilateral pubic rami fractures: s/p sustaining a mechanical fall.  Nonoperative management.  Per primary/ ortho ESRD: MWF HD. Next HD 10/15 Hypertension: reasonably well controlled now Anemia of ESRD: no iron  containing products. Received mircera, will redose aranesp  100 mcg q Sun Metabolic Bone Disease: on sevelamer , sensipar ; no vit D.  Calciphylaxis: Na thio with dialysis, wound care.   Dispo: D/w Hospitalist: patient is  deciding between home health vs SNF at this time  Charmaine Piety, NP [facility] 10/18/2023,2:33 PM  LOS: 3 days

## 2023-10-18 NOTE — Consult Note (Signed)
 Reason for Consult:Pelvic fxs Referring Physician: Mennie Lamy Time called: 9258 Time at bedside: 0907   MEEYAH [name] is an 48 y.o. female.  HPI: [name] fell several days ago when her legs gave out and she landed on her buttocks. She had immediate back pain but was able to get up and continue on her day. She came to the ED after HD where workup showed pelvic fxs. She was admitted and orthopedic surgery was consulted. She describes her pain today as a soreness on top of a chronic weakness in the left leg.  Past Medical History:  Diagnosis Date   Anemia    Anxiety    pt not aware of this   Arthritis    Breast mass 09/01/2016   Targeted ultrasound is performed, showing a mass in the left breast at 11 o'clock, 1 cm from the nipple measuring 6.4 by 5.0 cm. The mass is heterogeneous and may contain macroscopic fat.   IMPRESSION: The mass in the left breast is probably benign, possibly a Hamartoma.  RECOMMENDATION: Recommend six-month follow-up mammogram of the probably benign left breast mass. If necessary, an ultrasound co   CHF (congestive heart failure) (HCC)    hosp. 2011   Dilated cardiomyopathy (HCC)    Encounter for blood transfusion    ESRD on hemodialysis (HCC)    [facility] MWF   Family history of adverse reaction to anesthesia      Aunt has a tracheostomy and kept coughing after colonoscopy    Foot fracture    Gout    no flare up for several yrs   Headache    Heart murmur    as a child, no problems as an adult   History of blood transfusion    x 2   History of pneumonia    Hyperlipidemia    Hypertension    Menorrhagia    Morbid obesity (HCC)    Pneumonia    hosp. 2010   Shortness of breath    HX CHL - occasional. 04/09/21 patient sits down and shortness of breath susides.   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   Thyroid disease      Past Surgical History:  Procedure Laterality Date   A/V FISTULAGRAM Left 08/13/2021   Procedure: A/V Fistulagram;  Surgeon: Gretta Lonni PARAS, MD;  Location: [facility] INVASIVE CV LAB;  Service: Cardiovascular;  Laterality: Left;   ANGIOPLASTY  07/28/2015   Procedure: ANGIOPLASTY OF RIGHT BRACHIAL VEIN;  Surgeon: Redell LITTIE Door, MD;  Location: [facility] OR;  Service: Vascular;;   AV FISTULA PLACEMENT  08/04/2011   Procedure: ARTERIOVENOUS (AV) FISTULA CREATION;  Surgeon: Carlin FORBES Haddock, MD;  Location: [facility] OR;  Service: Vascular;  Laterality: Right;  Creation right brachiocephalic arteriovenous fistula   AV FISTULA PLACEMENT Left 02/05/2013   Procedure: ARTERIOVENOUS FISTULA CREATION LEFT ARM;  Surgeon: Redell LITTIE Door, MD;  Location: [facility] OR;  Service: Vascular;  Laterality: Left;   AV FISTULA PLACEMENT Right 06/04/2013   Procedure: ARTERIOVENOUS (AV) FISTULA CREATION;  Surgeon: Redell LITTIE Door, MD;  Location: [facility] OR;  Service: Vascular;  Laterality: Right;   AV FISTULA PLACEMENT Right 04/15/2014   Procedure: INSERTION OF ARTERIOVENOUS (AV) GORE-TEX GRAFT ARM;  Surgeon: Redell LITTIE Door, MD;  Location[facility] OR;  Service: Vascular;  Laterality: Right;   AV FISTULA PLACEMENT Left 02/25/2021   Procedure:  LEFT ARM ARTERIOVENOUS (AV) FISTULA CREATION;  Surgeon: Gretta Lonni PARAS, MD;  Location[facility] OR;  Service: Vascular;  Laterality: Left;   AV FISTULA PLACEMENT Left 08/26/2021   Procedure: LEFT ARM LIGATION OF BRACHIOBASILIC FISTULA,  REPAIR PSEUDOANEURYSM;  Surgeon: Gretta Lonni PARAS, MD;  Location: [facility] OR;  Service: Vascular;  Laterality: Left;   AV FISTULA PLACEMENT Left 10/12/2021   Procedure: LEFT RADIOCEPHALIC ARTERIOVENOUS FISTULA  ARTERIOVENOUS (AV) FISTULA CREATION WITH INTERPOSITION GRAFT;  Surgeon: Gretta Lonni PARAS, MD;  Location: [facility] OR;  Service: Vascular;  Laterality: Left;   BASCILIC VEIN TRANSPOSITION Right 08/13/2013   Procedure: Second Stage Brachial Vein Transposition;  Surgeon: Redell LITTIE Door, MD;   Location: [facility] OR;  Service: Vascular;  Laterality: Right;   BASCILIC VEIN TRANSPOSITION Left 04/13/2021   Procedure: LEFT ARM SECOND STAGE BASILIC VEIN TRANSPOSITION;  Surgeon: Gretta Lonni PARAS, MD;  Location: [facility] OR;  Service: Vascular;  Laterality: Left;   CARDIAC SURGERY  1979   repair of hole in heart   GRAFT APPLICATION Left 10/12/2021   Procedure: ARTEGRAFT COLLAGEN VASCULAR GRAFT APPLICATION;  Surgeon: Gretta Lonni PARAS, MD;  Location: [facility] OR;  Service: Vascular;  Laterality: Left;   INCISION AND DRAINAGE OF WOUND Right 07/28/2015   Procedure: IRRIGATION AND DEBRIDEMENT RIGHT HAND  WOUND;  Surgeon: Balinda Rogue, MD;  Location: [facility] OR;  Service: Plastics;  Laterality: Right;   LIGATION OF ARTERIOVENOUS  FISTULA Left 03/05/2013   Procedure: LIGATION OF ARTERIOVENOUS  FISTULA;  Surgeon: Redell LITTIE Door, MD;  Location: [facility] OR;  Service: Vascular;  Laterality: Left;   PATCH ANGIOPLASTY Left 03/05/2013   Procedure: PATCH ANGIOPLASTY;  Surgeon: Redell LITTIE Door, MD;  Location: [facility] OR;  Service: Vascular;  Laterality: Left;   PERIPHERAL VASCULAR BALLOON ANGIOPLASTY Left 08/13/2021   Procedure: PERIPHERAL VASCULAR BALLOON ANGIOPLASTY;  Surgeon: Gretta Lonni PARAS, MD;  Location: [facility] INVASIVE CV LAB;  Service: Cardiovascular;  Laterality: Left;  arm fistula   PERIPHERAL VASCULAR CATHETERIZATION Right 10/31/2014   Procedure: Fistulagram;  Surgeon: Redell LITTIE Door, MD;  Location: [facility] INVASIVE CV LAB;  Service: Cardiovascular;  Laterality: Right;   PERIPHERAL VASCULAR CATHETERIZATION Right 10/31/2014   Procedure: Peripheral Vascular Balloon Angioplasty;  Surgeon: Redell LITTIE Door, MD;  Location: [facility] INVASIVE CV LAB;  Service: Cardiovascular;  Laterality: Right;  pta venous rt arm   PERIPHERAL VASCULAR CATHETERIZATION N/A 03/13/2015   Procedure: A/V Shuntogram;  Surgeon: Redell LITTIE Door, MD;  Location: [facility] INVASIVE CV LAB;  Service: Cardiovascular;  Laterality: N/A;   PERIPHERAL VASCULAR CATHETERIZATION Right 03/13/2015    Procedure: Peripheral Vascular Balloon Angioplasty;  Surgeon: Redell LITTIE Door, MD;  Location: [facility] INVASIVE CV LAB;  Service: Cardiovascular;  Laterality: Right;  Shunt   PERIPHERAL VASCULAR CATHETERIZATION Right 01/19/2016   Procedure: A/V Shuntogram;  Surgeon: Lonni GORMAN Blade, MD;  Location: [facility] INVASIVE CV LAB;  Service: Cardiovascular;  Laterality: Right;   REVISION OF ARTERIOVENOUS GORETEX GRAFT Right 10/27/2015   Procedure: THROMBECTOMY AND REVISION OF RIGHT UPPER ARM  ARTERIOVENOUS GORETEX GRAFT USING X 10CM GORE-TEX GRAFT;  Surgeon: Carlin FORBES Haddock, MD;  Location: [facility] OR;  Service: Vascular;  Laterality: Right;   REVISION OF ARTERIOVENOUS GORETEX GRAFT Right 01/30/2016   Procedure: REVISION OF RIGHT UPPER ARM ARTERIOVENOUS USING GORETEX GRAFT;  Surgeon: Lonni GORMAN Blade, MD;  Location: [facility] OR;  Service: Vascular;  Laterality: Right;   THROMBECTOMY W/ EMBOLECTOMY Left 03/05/2013   Procedure: THROMBECTOMY OF LEFT BRACHIAL ARTERY;  Surgeon: Redell LITTIE Door, MD;  Location: [facility]  OR;  Service: Vascular;  Laterality: Left;   VENOGRAM Bilateral 03/25/2014   Procedure: VENOGRAM bilateral;  Surgeon: Redell LITTIE Door, MD;  Location: [facility] CATH LAB;  Service: Cardiovascular;  Laterality: Bilateral;    Family History  Problem Relation Age of Onset   Diabetes Mother    Hyperlipidemia Mother    Hypertension Mother    Kidney disease Mother    Anesthesia problems Neg Hx    Hypotension Neg Hx    Malignant hyperthermia Neg Hx    Pseudochol deficiency Neg Hx    Colon cancer Neg Hx    Esophageal cancer Neg Hx    Inflammatory bowel disease Neg Hx    Liver disease Neg Hx    Pancreatic cancer Neg Hx    Rectal cancer Neg Hx    Stomach cancer Neg Hx     Social History:  reports that she has never smoked. She has never been exposed to tobacco smoke. She has never used smokeless tobacco. She reports that she does not drink alcohol and does not use drugs.  Allergies:  Allergies  Allergen Reactions   Pollen  Extract Itching    Medications: I have reviewed the patient's current medications.  Results for orders placed or performed during the hospital encounter of 10/14/23 (from the past 48 hours)  Basic metabolic panel with GFR     Status: Abnormal   Collection Time: 10/17/23  9:42 AM  Result Value Ref Range   Sodium 133 (L) 135 - 145 mmol/L   Potassium 4.6 3.5 - 5.1 mmol/L   Chloride 92 (L) 98 - 111 mmol/L   CO2 23 22 - 32 mmol/L   Glucose, Bld 89 70 - 99 mg/dL    Comment: Glucose reference range applies only to samples taken after fasting for at least 8 hours.   BUN 32 (H) 6 - 20 mg/dL   Creatinine, Ser 1.04 (H) 0.44 - 1.00 mg/dL   Calcium  9.5 8.9 - 10.3 mg/dL   GFR, Estimated 5 (L) >60 mL/min    Comment: (NOTE) Calculated using the CKD-EPI Creatinine Equation (2021)    Anion gap 18 (H) 5 - 15    Comment: Performed at [facility], [address], [place], [place] [address]  CBC     Status: Abnormal   Collection Time: 10/17/23  9:42 AM  Result Value Ref Range   WBC 13.2 (H) 4.0 - 10.5 K/uL   RBC 2.91 (L) 3.87 - 5.11 MIL/uL   Hemoglobin 8.9 (L) 12.0 - 15.0 g/dL   HCT 72.1 (L) 63.9 - 53.9 %   MCV 95.5 80.0 - 100.0 fL   MCH 30.6 26.0 - 34.0 pg   MCHC 32.0 30.0 - 36.0 g/dL   RDW 84.5 88.4 - 84.4 %   Platelets 251 150 - 400 K/uL   nRBC 0.0 0.0 - 0.2 %    Comment: Performed at [facility], [address], [place], [place] [address]    No results found.  Review of Systems  HENT:  Negative for ear discharge, ear pain, hearing loss and tinnitus.   Eyes:  Negative for photophobia and pain.  Respiratory:  Negative for cough and shortness of breath.   Cardiovascular:  Negative for chest pain.  Gastrointestinal:  Negative for abdominal pain, nausea and vomiting.  Genitourinary:  Negative for dysuria, flank pain, frequency and urgency.  Musculoskeletal:  Positive for arthralgias (Left leg). Negative for back pain, myalgias and neck pain.  Neurological:  Positive for  weakness (Left leg). Negative for dizziness and headaches.  Hematological:  Does not bruise/bleed easily.  Psychiatric/Behavioral:  The patient is not nervous/anxious.    Blood pressure 128/82, pulse 85, temperature 98.8 F (37.1 C), resp. rate 18, height 5' 8 (1.727 m), weight 120.1 kg, SpO2 96%. Physical Exam Constitutional:      General: She is not in acute distress.    Appearance: She is well-developed. She is not diaphoretic.  HENT:     Head: Normocephalic and atraumatic.  Eyes:     General: No scleral icterus.       Right eye: No discharge.        Left eye: No discharge.     Conjunctiva/sclera: Conjunctivae normal.  Cardiovascular:     Rate and Rhythm: Normal rate and regular rhythm.  Pulmonary:     Effort: Pulmonary effort is normal. No respiratory distress.  Musculoskeletal:     Cervical back: Normal range of motion.     Comments: Pelvis--no traumatic wounds or rash, no ecchymosis, stable to manual stress, nontender, EHL 5/5 bilaterally  Skin:    General: Skin is warm and dry.  Neurological:     Mental Status: She is alert.  Psychiatric:        Mood and Affect: Mood normal.        Behavior: Behavior normal.     Assessment/Plan: Pelvic fxs -- Plan non-operative management with WBAT BLE. F/u with Dr. Barton in 3 weeks. Could consider MRI L-spine to evaluate LLE weakness.    Ozell DOROTHA Ned, PA-C Orthopedic Surgery [phone] 10/18/2023, 9:15 AM

## 2023-10-18 NOTE — [place] FL2 (Signed)
 [place]  MEDICAID FL2 LEVEL OF CARE FORM     IDENTIFICATION  Patient Name: [name] Birthdate: [birth year] Sex: female Admission Date (Current Location): 10/14/2023  [facility] and [place] Number:  Producer, television/film/video and Address:  [facility], 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) 10/01/2023   Open wound of both legs with complication 09/29/2023   Trauma 09/13/2023   Chronic combined systolic and diastolic CHF (congestive heart failure) (HCC) 03/24/2022   Calculus of gallbladder without cholecystitis without obstruction 01/15/2022   Pyrosis 01/15/2022   Colon cancer screening 01/15/2022   Belching 01/15/2022   Left upper quadrant abdominal pain 01/15/2022   Complication of vascular dialysis catheter 10/21/2020   ESRD on dialysis (HCC) 10/21/2020   Iron  deficiency anemia, unspecified 02/17/2018   Gout, unspecified 02/15/2018   Unspecified lump in the left breast, unspecified quadrant 02/15/2018   Body mass index (BMI) 45.0-49.9, adult (HCC) 02/15/2018   Superficial mycosis, unspecified 02/15/2018   Other seasonal allergic rhinitis 02/15/2018   Secondary hyperparathyroidism of renal origin 02/14/2018   Anemia in chronic kidney disease (CKD) 07/31/2015   Essential hypertension 02/27/2014   Menorrhagia 01/27/2013   Mixed hyperlipidemia 04/20/2008   OBESITY-MORBID (>100')  04/20/2008   MITRAL REGURGITATION 04/20/2008   Dilated cardiomyopathy (HCC) 04/20/2008    Orientation RESPIRATION BLADDER Height & Weight     Self, Time, Situation, Place  Normal Continent Weight: 264 lb 12.4 oz (120.1 kg) Height:  5' 8 (172.7 cm)  BEHAVIORAL SYMPTOMS/MOOD NEUROLOGICAL BOWEL NUTRITION STATUS      Continent Diet (please refer to DC summary)  AMBULATORY STATUS COMMUNICATION OF NEEDS Skin   Extensive Assist Verbally Other (Comment) (vascular ulcer tibial posterior (right), vascular ulcer pretibial left)                       Personal Care Assistance Level of Assistance  Bathing, Feeding, Dressing Bathing Assistance: Limited assistance Feeding assistance: Independent Dressing Assistance: Limited assistance     Functional Limitations Info  Sight, Hearing, Speech Sight Info: Impaired (wears glasses) Hearing Info: Adequate Speech Info: Adequate    SPECIAL CARE FACTORS FREQUENCY  PT (By licensed PT), OT (By licensed OT)     PT Frequency: 5x/week OT Frequency: 5x/week            Contractures Contractures Info: Not present    Additional Factors Info  Code Status, Allergies Code Status Info: Full Allergies Info: Pollen Extract           Current Medications (10/18/2023):  This is the current hospital active medication list Current Facility-Administered Medications  Medication Dose Route Frequency Provider Last Rate Last Admin   acetaminophen  (TYLENOL ) tablet 650 mg  650 mg Oral Q6H PRN Patel, Vishal R, MD       Or   acetaminophen  (TYLENOL ) suppository 650 mg  650 mg Rectal Q6H PRN Tobie Jorie SAUNDERS, MD  albuterol  (PROVENTIL ) (2.5 MG/3ML) 0.083% nebulizer solution 3 mL  3 mL Nebulization Q6H PRN Patel, Vishal R, MD       bisacodyl (DULCOLAX) EC tablet 5 mg  5 mg Oral Daily PRN Patel, Vishal R, MD       cadexomer iodine  (IODOSORB) 0.9 % gel   Topical QODAY Patel, Vishal R, MD   Given at 10/16/23 9380   Chlorhexidine  Gluconate Cloth 2 % PADS 6  each  6 each Topical Q0600 Gearline Norris, MD       cinacalcet  (SENSIPAR ) tablet 180 mg  180 mg Oral Q M,W,F-1800 Patel, Vishal R, MD       Darbepoetin Alfa  (ARANESP ) injection 100 mcg  100 mcg Subcutaneous Q Sun-1800 Upton, Elizabeth, MD   100 mcg at 10/16/23 1718   diphenhydrAMINE  (BENADRYL ) capsule 25 mg  25 mg Oral Q6H PRN Patel, Vishal R, MD   25 mg at 10/17/23 0006   Or   diphenhydrAMINE  (BENADRYL ) injection 12.5 mg  12.5 mg Intramuscular Q6H PRN Patel, Vishal R, MD       feeding supplement (NEPRO CARB STEADY) liquid 237 mL  237 mL Oral BID BM Krishnan, Gokul, MD       fluticasone  furoate-vilanterol (BREO ELLIPTA ) 100-25 MCG/ACT 1 puff  1 puff Inhalation Daily Tobie Jorie SAUNDERS, MD   1 puff at 10/18/23 0746   gabapentin  (NEURONTIN ) capsule 200 mg  200 mg Oral QHS Patel, Vishal R, MD   200 mg at 10/17/23 2105   heparin  injection 5,000 Units  5,000 Units Subcutaneous Q8H Patel, Vishal R, MD   5,000 Units at 10/18/23 9342   HYDROmorphone  (DILAUDID ) injection 0.5-1 mg  0.5-1 mg Intravenous Q3H PRN Patel, Vishal R, MD   1 mg at 10/17/23 2106   megestrol  (MEGACE ) tablet 20 mg  20 mg Oral BID Krishnan, Gokul, MD   20 mg at 10/18/23 9142   methocarbamol  (ROBAXIN ) tablet 500 mg  500 mg Oral Q6H PRN Krishnan, Gokul, MD       ondansetron  (ZOFRAN ) tablet 4 mg  4 mg Oral Q6H PRN Patel, Vishal R, MD       Or   ondansetron  (ZOFRAN ) injection 4 mg  4 mg Intravenous Q6H PRN Patel, Vishal R, MD       oxyCODONE -acetaminophen  (PERCOCET/ROXICET) 5-325 MG per tablet 1 tablet  1 tablet Oral Q4H PRN Krishnan, Gokul, MD       And   oxyCODONE  (Oxy IR/ROXICODONE ) immediate release tablet 5 mg  5 mg Oral Q4H PRN Krishnan, Gokul, MD   5 mg at 10/18/23 0857   pantoprazole  (PROTONIX ) EC tablet 40 mg  40 mg Oral Daily Patel, Vishal R, MD   40 mg at 10/18/23 0857   polyethylene glycol (MIRALAX  / GLYCOLAX ) packet 17 g  17 g Oral BID Krishnan, Gokul, MD   17 g at 10/18/23 0857   senna-docusate (Senokot-S) tablet 2 tablet  2  tablet Oral QHS Krishnan, Gokul, MD   2 tablet at 10/17/23 2105   sevelamer  carbonate (RENVELA ) tablet 2,400 mg  2,400 mg Oral TID WC Patel, Vishal R, MD   2,400 mg at 10/18/23 0857   sodium thiosulfate  25 g in sodium chloride  0.9 % 200 mL Infusion for Calciphylaxis  25 g Intravenous Q M,W,F-HD Gearline Norris, MD 200 mL/hr at 10/17/23 1700 25 g at 10/17/23 1700     Discharge Medications: Please see discharge summary for a list of discharge medications.  Relevant Imaging Results:  Relevant Lab Results:   Additional Information  Pt's SSN: [id number]  Sherline Clack, LCSWA

## 2023-10-18 NOTE — Care Management Important Message (Signed)
 Important Message  Patient Details  Name: [name] MRN: [id number] Date of Birth: [birth year]   Important Message Given:  Yes - Medicare IM     Claretta Deed 10/18/2023, 11:14 AM

## 2023-10-19 ENCOUNTER — Inpatient Hospital Stay (HOSPITAL_COMMUNITY)

## 2023-10-19 DIAGNOSIS — S32592A Other specified fracture of left pubis, initial encounter for closed fracture: Secondary | ICD-10-CM | POA: Diagnosis not present

## 2023-10-19 DIAGNOSIS — S32591A Other specified fracture of right pubis, initial encounter for closed fracture: Secondary | ICD-10-CM | POA: Diagnosis not present

## 2023-10-19 LAB — CBC WITH DIFFERENTIAL/PLATELET
Abs Immature Granulocytes: 0.11 K/uL — ABNORMAL HIGH (ref 0.00–0.07)
Basophils Absolute: 0 K/uL (ref 0.0–0.1)
Basophils Relative: 0 %
Eosinophils Absolute: 0.2 K/uL (ref 0.0–0.5)
Eosinophils Relative: 2 %
HCT: 27.8 % — ABNORMAL LOW (ref 36.0–46.0)
Hemoglobin: 8.8 g/dL — ABNORMAL LOW (ref 12.0–15.0)
Immature Granulocytes: 1 %
Lymphocytes Relative: 14 %
Lymphs Abs: 1.5 K/uL (ref 0.7–4.0)
MCH: 30 pg (ref 26.0–34.0)
MCHC: 31.7 g/dL (ref 30.0–36.0)
MCV: 94.9 fL (ref 80.0–100.0)
Monocytes Absolute: 1.2 K/uL — ABNORMAL HIGH (ref 0.1–1.0)
Monocytes Relative: 11 %
Neutro Abs: 7.7 K/uL (ref 1.7–7.7)
Neutrophils Relative %: 72 %
Platelets: 256 K/uL (ref 150–400)
RBC: 2.93 MIL/uL — ABNORMAL LOW (ref 3.87–5.11)
RDW: 15.6 % — ABNORMAL HIGH (ref 11.5–15.5)
WBC: 10.7 K/uL — ABNORMAL HIGH (ref 4.0–10.5)
nRBC: 0 % (ref 0.0–0.2)

## 2023-10-19 LAB — RENAL FUNCTION PANEL
Albumin: 2.1 g/dL — ABNORMAL LOW (ref 3.5–5.0)
Anion gap: 21 — ABNORMAL HIGH (ref 5–15)
BUN: 32 mg/dL — ABNORMAL HIGH (ref 6–20)
CO2: 22 mmol/L (ref 22–32)
Calcium: 9.9 mg/dL (ref 8.9–10.3)
Chloride: 92 mmol/L — ABNORMAL LOW (ref 98–111)
Creatinine, Ser: 7.92 mg/dL — ABNORMAL HIGH (ref 0.44–1.00)
GFR, Estimated: 6 mL/min — ABNORMAL LOW (ref >60)
Glucose, Bld: 90 mg/dL (ref 70–99)
Phosphorus: 4.6 mg/dL (ref 2.5–4.6)
Potassium: 4.4 mmol/L (ref 3.5–5.1)
Sodium: 135 mmol/L (ref 135–145)

## 2023-10-19 MED ORDER — HYDROMORPHONE HCL 1 MG/ML IJ SOLN
INTRAMUSCULAR | Status: AC
  Filled 2023-10-19: qty 1

## 2023-10-19 MED ORDER — OXYCODONE HCL 5 MG PO TABS
10.0000 mg | ORAL_TABLET | ORAL | Status: AC
  Administered 2023-10-19: 10 mg via ORAL
  Filled 2023-10-19: qty 2

## 2023-10-19 MED ORDER — PENTAFLUOROPROP-TETRAFLUOROETH EX AERO: 1.0000 | INHALATION_SPRAY | CUTANEOUS | Status: DC | PRN

## 2023-10-19 MED ORDER — HYDROMORPHONE HCL 1 MG/ML IJ SOLN: 0.5000 mg | INTRAMUSCULAR | Status: DC

## 2023-10-19 MED ORDER — HYDROMORPHONE HCL 1 MG/ML IJ SOLN
1.0000 mg | INTRAMUSCULAR | Status: AC
  Administered 2023-10-19: 1 mg via INTRAVENOUS
  Filled 2023-10-19: qty 1

## 2023-10-19 MED ORDER — HYDROXYZINE HCL 10 MG PO TABS
10.0000 mg | ORAL_TABLET | ORAL | Status: AC
Start: 2023-10-20 — End: 2023-10-19
  Administered 2023-10-19: 10 mg via ORAL
  Filled 2023-10-19: qty 1

## 2023-10-19 MED ORDER — MORPHINE SULFATE (PF) 2 MG/ML IV SOLN: 2.0000 mg | INTRAVENOUS | Status: DC | PRN

## 2023-10-19 NOTE — Progress Notes (Signed)
 OT Cancellation Note  Patient Details Name: [name] MRN: [id number] DOB: 11/17/1975   Cancelled Treatment:    Reason Eval/Treat Not Completed: Patient at procedure or test/ unavailable Patient at HD, OT will follow back as time permits.   Ronal Gift E. Safiatou Islam, OTR/L Acute Rehabilitation Services [phone]   Ronal Gift Salt 10/19/2023, 11:22 AM

## 2023-10-19 NOTE — Plan of Care (Signed)

## 2023-10-19 NOTE — Procedures (Signed)
 HD Note:  Some information was entered later than the data was gathered due to patient care needs. The stated time with the data is accurate.  Received patient in bed to unit.   Alert and oriented.  Patient arrived to the unit with pain in bilateral legs, Medication given, see MAR  Informed consent signed and in chart.   Access used: left upper arm fistula Access issues: None  Patient tolerated treatment well.   TX duration: 3.5 ml  Alert, without acute distress.  Total UF removed: 2000 ml  Hand-off given to patient's nurse.   Transported back to the room   Yuritzi Kamp L. Lenon, RN Kidney Dialysis Unit.

## 2023-10-19 NOTE — Progress Notes (Signed)
 [facility] Progress Note   Subjective:    Seen and examined patient on HD. C/o ongoing b/l leg pain (calciphylaxis). Tolerating UFG 2L. She expresses concern about her overall mobility. Noted PT/OT have recommended SNF placement. She tells me inpatient rehab may be an option for her.  Objective Vitals:   10/19/23 0952 10/19/23 1008 10/19/23 1016 10/19/23 1022  BP: (!) 130/57 104/60  124/68  Pulse: 91 98 98 95  Resp: 16 17 17 18   Temp:      TempSrc:      SpO2: 100% 90% 100% 96%  Weight:      Height:       Physical Exam General: Awake, alert, NAD, obese Heart: S1 and S2; No murmurs, gallops, or rubs Lungs: Clear throughout; No wheezing, rales, or rhonchi Abdomen: Large, soft, and non-tender Extremities: No LE edema; legs wrapped 2nd calciphylaxis Dialysis Access: L AVF   Filed Weights   10/16/23 0111 10/17/23 1450 10/19/23 0813  Weight: 121.1 kg 120.1 kg 116.8 kg    Intake/Output Summary (Last 24 hours) at 10/19/2023 1041 Last data filed at 10/18/2023 2216 Gross per 24 hour  Intake 100 ml  Output --  Net 100 ml    Additional Objective Labs: Basic Metabolic Panel: Recent Labs  Lab 10/15/23 0920 10/17/23 0942 10/19/23 0143  NA 136 133* 135  K 4.0 4.6 4.4  CL 90* 92* 92*  CO2 23 23 22   GLUCOSE 91 89 90  BUN 33* 32* 32*  CREATININE 10.19* 8.95* 7.92*  CALCIUM  9.1 9.5 9.9  PHOS  --   --  4.6   Liver Function Tests: Recent Labs  Lab 10/19/23 0143  ALBUMIN 2.1*   No results for input(s): LIPASE, AMYLASE in the last 168 hours. CBC: Recent Labs  Lab 10/14/23 1942 10/15/23 0920 10/16/23 0720 10/17/23 0942 10/19/23 0143  WBC 19.1* 13.6* 13.1* 13.2* 10.7*  NEUTROABS  --   --   --   --  7.7  HGB 9.3* 8.1* 8.8* 8.9* 8.8*  HCT 30.1* 25.5* 28.3* 27.8* 27.8*  MCV 99.3 97.3 97.3 95.5 94.9  PLT 290 244 210 251 256   Blood Culture    Component Value Date/Time   SDES URINE, CLEAN CATCH 02/20/2008 0755   SPECREQUEST NONE 02/20/2008 0755    CULT  02/20/2008 0755    Multiple bacterial morphotypes present, none predominant. Suggest appropriate recollection if clinically indicated.   REPTSTATUS 02/21/2008 FINAL 02/20/2008 0755    Cardiac Enzymes: No results for input(s): CKTOTAL, CKMB, CKMBINDEX, TROPONINI in the last 168 hours. CBG: Recent Labs  Lab 10/18/23 1212  GLUCAP 90   Iron  Studies: No results for input(s): IRON , TIBC, TRANSFERRIN, FERRITIN in the last 72 hours. Lab Results  Component Value Date   INR 1.3 (H) 09/13/2023   INR 1.21 02/28/2014   INR 1.19 01/28/2013   Studies/Results: No results found.  Medications:  sodium thiosulfate  25 g in sodium chloride  0.9 % 200 mL Infusion for Calciphylaxis 25 g (10/17/23 1700)    cadexomer iodine    Topical QODAY   Chlorhexidine  Gluconate Cloth  6 each Topical Q0600   cinacalcet   180 mg Oral Q M,W,F-1800   darbepoetin (ARANESP ) injection - DIALYSIS  100 mcg Subcutaneous Q Sun-1800   feeding supplement (NEPRO CARB STEADY)  237 mL Oral BID BM   fluticasone  furoate-vilanterol  1 puff Inhalation Daily   gabapentin   200 mg Oral QHS   heparin   5,000 Units Subcutaneous Q8H   megestrol   20 mg Oral  BID   pantoprazole   40 mg Oral Daily   polyethylene glycol  17 g Oral BID   senna-docusate  2 tablet Oral QHS   sevelamer  carbonate  2,400 mg Oral TID WC    Dialysis Orders: [facility] MWF  4 hrs 2K/2Ca   122.7kg  B450  AVF  heparin  ? Sensipar  180 mg three times per week Mircera 100 mcg q 2 weeks Sodium thiosulfate  25 g IV three times per week  Assessment/Plan: Bilateral pubic rami fractures: s/p sustaining a mechanical fall.  Nonoperative management.  Per primary/ ortho ESRD: MWF HD. On HD Hypertension: reasonably well controlled now Anemia of ESRD: no iron  containing products. Received mircera, will redose aranesp  100 mcg q Sun Metabolic Bone Disease: on sevelamer , sensipar ; no vit D.  Calciphylaxis: Na thio with dialysis, wound care.   Dispo: D/w  Hospitalist: patient is deciding between home health vs SNF vs inpatient rehab.  Charmaine Piety, NP [facility] 10/19/2023,10:41 AM  LOS: 4 days

## 2023-10-19 NOTE — Progress Notes (Signed)
 PROGRESS NOTE    [name]  FMW:[id number] DOB: August 18, 1975 DOA: 10/14/2023 PCP: Luvenia Sherry, FNP   Brief Narrative:  [name] is a 48 y.o. 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 Patient was found to have left sacral alar fracture along with bilateral superior and inferior pubic rami fracture. Patient was unable to ambulate. She was hospitalized for further management.   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  Patient is still complaining of bilateral lower extremity weakness, left more than the right.  Prior to this fall, patient was able to walk with a walker.  Per orthopedics, obtain MRI if indicated.  I have ordered MRI thoracolumbar spine without contrast.  Seen by PT OT, they recommended SNF, TOC is working on placement.   ESRD on HD mwf Metabolic bone disease Calciphylaxis: Nephrology is following.  Getting hemodialysis.   Chronic systolic and diastolic CHF TTE 7975-OCZQ of 25 to 30%, G3DD.Currently euvolemic,Managed with hd Followed by Dr.  Ladona with cardiology.   Calciphylaxis with chronic ulcerative wounds to bilateral lower extremities: Continue wound care   Anemia of ESRD Hb stable, continue aranesp , monitor hemoglobin   Leukocytosis Most likely reactive.  She is afebrile.  Trending down    History of asthma Stable.   Morbid Obesity w/ Body mass index is 40.26 kg/m.: Will benefit with PCP follow-up, weight loss,healthy lifestyle and outpatient sleep eval if not done.  DVT prophylaxis: heparin  injection 5,000 Units Start: 10/14/23 2200   Code Status: Full Code  Family Communication:  None present at bedside.  Plan of care discussed with patient in length and he/she verbalized understanding and agreed with it.  Status is: Inpatient Remains inpatient appropriate because: Needs MRI thoracolumbar spine.   Estimated body mass index is 39.15 kg/m as calculated from the following:   Height as of this encounter: 5' 8 (1.727 m).   Weight as of this encounter: 116.8 kg.    Nutritional Assessment: Body mass index is 39.15 kg/m.[name] Seen by dietician.  I agree with the assessment and plan as outlined below: Nutrition Status:        . Skin Assessment: I have examined the patient's skin and I agree with the wound assessment as performed by the wound care RN as outlined below:    Consultants:  Nephrology and orthopedics  Procedures:  As above  Antimicrobials:  Anti-infectives (From admission, onward)    None         Subjective: Patient seen and examined in dialysis, her only complaint is bilateral lower extremity wounds and weakness in bilateral lower extremities to the point that she is unable to lift  both extremities, weakness appears to be worse in the left lower extremity than the right lower extremity.  Objective: Vitals:   10/18/23 2219 10/19/23 0742 10/19/23 0813 10/19/23 0829  BP: 107/75  110/75 125/76  Pulse: 88 86 90 94  Resp:  18 17 17   Temp: 98.3 F (36.8 C)  (!) 97.4 F (36.3 C)    TempSrc: Oral     SpO2: 99% 99% 97% 98%  Weight:   116.8 kg   Height:        Intake/Output Summary (Last 24 hours) at 10/19/2023 0930 Last data filed at 10/18/2023 2216 Gross per 24 hour  Intake 100 ml  Output --  Net 100 ml   Filed Weights   10/16/23 0111 10/17/23 1450 10/19/23 0813  Weight: 121.1 kg 120.1 kg 116.8 kg    Examination:  General exam: Appears calm and comfortable  Respiratory system: Clear to auscultation. Respiratory effort normal. Cardiovascular system: S1 & S2 heard, RRR. No JVD, murmurs, rubs, gallops or clicks. No pedal edema. Gastrointestinal system: Abdomen is nondistended, soft and nontender. No organomegaly or masses felt. Normal bowel sounds heard. Central nervous system: Alert and oriented.  0/5 power in left upper extremity and 1/5 power in right lower extremity. Extremities: Symmetric 5 x 5 power. Skin: Has dressings in bilateral lower extremities due to wounds. Psychiatry: Judgement and insight appear normal. Mood & affect appropriate.    Data Reviewed: I have personally reviewed following labs and imaging studies  CBC: Recent Labs  Lab 10/14/23 1942 10/15/23 0920 10/16/23 0720 10/17/23 0942 10/19/23 0143  WBC 19.1* 13.6* 13.1* 13.2* 10.7*  NEUTROABS  --   --   --   --  7.7  HGB 9.3* 8.1* 8.8* 8.9* 8.8*  HCT 30.1* 25.5* 28.3* 27.8* 27.8*  MCV 99.3 97.3 97.3 95.5 94.9  PLT 290 244 210 251 256   Basic Metabolic Panel: Recent Labs  Lab 10/14/23 1942 10/15/23 0920 10/17/23 0942 10/19/23 0143  NA 139 136 133* 135  K 3.7 4.0 4.6 4.4  CL 89* 90* 92* 92*  CO2 24 23 23 22   GLUCOSE 80 91 89 90  BUN 32* 33* 32* 32*  CREATININE 9.01* 10.19* 8.95* 7.92*  CALCIUM  10.7* 9.1 9.5 9.9  PHOS  --   --   --  4.6   GFR: Estimated Creatinine Clearance: 11.5 mL/min (A) (by C-G formula based on SCr of 7.92 mg/dL (H)). Liver Function Tests: Recent Labs  Lab 10/19/23 0143  ALBUMIN 2.1*   No results for input(s): LIPASE, AMYLASE in the  last 168 hours. No results for input(s): AMMONIA in the last 168 hours. Coagulation Profile: No results for input(s): INR, PROTIME in the last 168 hours. Cardiac Enzymes: No results for input(s): CKTOTAL, CKMB, CKMBINDEX, TROPONINI in the last 168 hours. BNP (last 3 results) No results for input(s): PROBNP in the last 8760 hours. HbA1C: No results for input(s): HGBA1C in the last 72 hours. CBG: Recent Labs  Lab 10/18/23 1212  GLUCAP 90   Lipid Profile: No results for input(s): CHOL, HDL, LDLCALC, TRIG, CHOLHDL, LDLDIRECT in the last 72 hours. Thyroid Function Tests: No results for input(s): TSH, T4TOTAL, FREET4, T3FREE, THYROIDAB in the last 72 hours. Anemia Panel: No results for input(s): VITAMINB12, FOLATE, FERRITIN, TIBC, IRON , RETICCTPCT in the last 72 hours. Sepsis Labs: No results for input(s): PROCALCITON, LATICACIDVEN in the last 168 hours.  No results found for this or any previous visit (from the past 240 hours).   Radiology Studies: No  results found.  Scheduled Meds:  cadexomer iodine    Topical QODAY   Chlorhexidine  Gluconate Cloth  6 each Topical Q0600   cinacalcet   180 mg Oral Q M,W,F-1800   darbepoetin (ARANESP ) injection - DIALYSIS  100 mcg Subcutaneous Q Sun-1800   feeding supplement (NEPRO CARB STEADY)  237 mL Oral BID BM   fluticasone  furoate-vilanterol  1 puff Inhalation Daily   gabapentin   200 mg Oral QHS   heparin   5,000 Units Subcutaneous Q8H   megestrol   20 mg Oral BID   pantoprazole   40 mg Oral Daily   polyethylene glycol  17 g Oral BID   senna-docusate  2 tablet Oral QHS   sevelamer  carbonate  2,400 mg Oral TID WC   Continuous Infusions:  sodium thiosulfate  25 g in sodium chloride  0.9 % 200 mL Infusion for Calciphylaxis 25 g (10/17/23 1700)     LOS: 4 days   Fredia Skeeter, MD [facility]  10/19/2023, 9:30 AM   *Please note that this is a verbal dictation therefore any  spelling or grammatical errors are due to the Dragon Medical One system interpretation.  Please page via Amion and do not message via secure chat for urgent patient care matters. Secure chat can be used for non urgent patient care matters.  How to contact the [facility] Attending or Consulting provider 7A - 7P or covering provider during after hours 7P -7A, for this patient?  Check the care team in [facility] and look for a) attending/consulting [facility] provider listed and b) the [facility] team listed. Page or secure chat 7A-7P. Log into [email or web address] and use [place]'s universal password to access. If you do not have the password, please contact the hospital operator. Locate the [facility] provider you are looking for under [facility] and page to a number that you can be directly reached. If you still have difficulty reaching the provider, please page the [facility] (Director on Call) for the Hospitalists listed on amion for assistance.

## 2023-10-20 ENCOUNTER — Other Ambulatory Visit: Admitting: Psychology

## 2023-10-20 ENCOUNTER — Inpatient Hospital Stay (HOSPITAL_COMMUNITY)

## 2023-10-20 DIAGNOSIS — S32591A Other specified fracture of right pubis, initial encounter for closed fracture: Secondary | ICD-10-CM | POA: Diagnosis not present

## 2023-10-20 DIAGNOSIS — S32592A Other specified fracture of left pubis, initial encounter for closed fracture: Secondary | ICD-10-CM | POA: Diagnosis not present

## 2023-10-20 MED ORDER — OXYCODONE HCL 5 MG PO TABS
10.0000 mg | ORAL_TABLET | ORAL | Status: DC | PRN
  Administered 2023-10-20 – 2023-10-21 (×4): 10 mg via ORAL
  Filled 2023-10-20 (×3): qty 2

## 2023-10-20 MED ORDER — CHLORHEXIDINE GLUCONATE CLOTH 2 % EX PADS: 6.0000 | MEDICATED_PAD | Freq: Every day | CUTANEOUS | Status: DC

## 2023-10-20 NOTE — Progress Notes (Addendum)
 [facility] Progress Note   Subjective:    Seen and examined patient at bedside. Tolerated yesterday's HD with net UF 2L. Noted patient c/o ongoing leg pain overnight requiring medications. Per PT, recommended SNF and now working on placement. Next HD 10/17.  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 Dialysis Access: L AVF   Filed Weights   10/17/23 1450 10/19/23 0813 10/19/23 1233  Weight: 120.1 kg 116.8 kg 116.7 kg    Intake/Output Summary (Last 24 hours) at 10/20/2023 1555 Last data filed at 10/19/2023 2029 Gross per 24 hour  Intake 400 ml  Output --  Net 400 ml    Additional Objective Labs: Basic Metabolic Panel: Recent Labs  Lab 10/15/23 0920 10/17/23 0942 10/19/23 0143  NA 136 133* 135  K 4.0 4.6 4.4  CL 90* 92* 92*  CO2 23 23 22   GLUCOSE 91 89 90  BUN 33* 32* 32*  CREATININE 10.19* 8.95* 7.92*  CALCIUM  9.1 9.5 9.9  PHOS  --   --  4.6   Liver Function Tests: Recent Labs  Lab 10/19/23 0143  ALBUMIN 2.1*   No results for input(s): LIPASE, AMYLASE in the last 168 hours. CBC: Recent Labs  Lab 10/14/23 1942 10/15/23 0920 10/16/23 0720 10/17/23 0942 10/19/23 0143  WBC 19.1* 13.6* 13.1* 13.2* 10.7*  NEUTROABS  --   --   --   --  7.7  HGB 9.3* 8.1* 8.8* 8.9* 8.8*  HCT 30.1* 25.5* 28.3* 27.8* 27.8*  MCV 99.3 97.3 97.3 95.5 94.9  PLT 290 244 210 251 256   Blood Culture    Component Value Date/Time   SDES URINE, CLEAN CATCH 02/20/2008 0755    SPECREQUEST NONE 02/20/2008 0755   CULT  02/20/2008 0755    Multiple bacterial morphotypes present, none predominant. Suggest appropriate recollection if clinically indicated.   REPTSTATUS 02/21/2008 FINAL 02/20/2008 0755    Cardiac Enzymes: No results for input(s): CKTOTAL, CKMB, CKMBINDEX, TROPONINI in the last 168 hours. CBG: Recent Labs  Lab 10/18/23 1212  GLUCAP 90   Iron  Studies: No results for input(s): IRON , TIBC, TRANSFERRIN, FERRITIN in the last 72 hours. Lab Results  Component Value Date   INR 1.3 (H) 09/13/2023   INR 1.21 02/28/2014   INR 1.19 01/28/2013   Studies/Results: MR LUMBAR SPINE WO CONTRAST Result Date: 10/20/2023 EXAM: MRI LUMBAR SPINE 10/20/2023 12:41:32 AM TECHNIQUE: Multiplanar multisequence MRI of the lumbar spine was performed without the administration of intravenous contrast. COMPARISON: Prior CT from 10/14/2023. CLINICAL HISTORY: Low back pain, no red flags, no prior management. FINDINGS: BONES AND ALIGNMENT: Exaggeration of the normal lumbar lordosis. Trace degenerative retrolisthesis of T12 on L1 and L1 on L2. Vertebral body heights are otherwise maintained, except for mild chronic wedging deformity of T12. Previously identified acute left sacral fracture is partially visualized and grossly stable from prior. No acute abnormality or recent fracture within the lumbar spine.  Bone marrow signal demonstrates decreased T1 signal intensity throughout the visualized bone marrow, nonspecific, but most commonly related to anemia, smoking, or obesity. No worrisome osseous lesions. Mild reactive marrow edema is noted about the L4-L5 facets bilaterally due to facet arthritis. SPINAL CORD: The conus medullaris terminates at the level of L1. SOFT TISSUES: Diffuse edema is noted within the paraspinal soft tissues about the left lumbosacral junction related to the acute sacral fracture (series 6, image 15). No frank hematoma formation or other loculated  collection. VISUALIZED KIDNEYS: Visualized kidneys are atrophic bilaterally with a few small benign appearing T2 hyperintense cysts. T12-L1: Seen only on sagittal projection. Degenerative endplate spurring. Disc desiccation with diffuse disc bulge. Mild facet hypertrophy. No significant spinal stenosis. Mild bilateral foraminal narrowing. L1-L2: Disc desiccation with diffuse disc bulge. Reactive endplate spurring. No significant spinal stenosis. Moderate bilateral L1 foraminal narrowing. L2-L3: Disc desiccation with mild disc bulge. Disc bulging is slightly asymmetric to the left. Mild facet hypertrophy. No significant spinal stenosis. Moderate bilateral L2 foraminal narrowing. L3-L4: Disc desiccation with diffuse disc bulge. Reactive endplate spurring. Mild to moderate facet hypertrophy. No significant spinal stenosis. Moderate right worse than left L3 foraminal narrowing. L4-L5: Disc desiccation with diffuse disc bulge. Moderate right worse than left facet arthrosis with associated small joint effusions and reactive marrow edema. Mild ligamentum flavum hypertrophy. No more than mild spinal stenosis. Moderate right with mild left L4 foraminal narrowing. L5-S1: Disc desiccation with diffuse disc bulge and reactive endplate spurring. Superimposed tiny central disc protrusion with slight superior migration. Mild facet hypertrophy. Epidural lipomatosis. No significant spinal stenosis. Moderate bilateral L5 foraminal narrowing. IMPRESSION: 1. Previously identified acute left sacral fracture, partially visualized, grossly stable compared to prior. 2. No other acute traumatic injury within the lumbar spine. 3. Multilevel lumbar spondylosis and facet arthrosis without significant spinal stenosis. Moderate bilateral L1 through L5 foraminal narrowing as above. 4. Moderate bilateral facet arthrosis at L4-L5 with reactive marrow edema. Finding could contribute to lower back pain. Electronically signed by: Morene Hoard  MD 10/20/2023 01:12 AM EDT RP Workstation: [id number]   MR THORACIC SPINE WO CONTRAST Result Date: 10/19/2023 EXAM: MRI THORACIC SPINE WITHOUT INTRAVENOUS CONTRAST 10/19/2023 06:18:04 PM TECHNIQUE: Multiplanar multisequence MRI of the thoracic spine was performed without the administration of intravenous contrast. Examination moderately to severely degraded by motion artifact, limiting assessment. COMPARISON: Prior CT from 09/16/2023. CLINICAL HISTORY: Mid-back pain. Mid-back pain (pt moved during exam//best images obtained). FINDINGS: BONES AND ALIGNMENT: 3 mm degenerative retrolisthesis of T12 on L1. Alignment otherwise normal with preservation of the normal thoracic kyphosis. Vertebral body height maintained without acute or chronic fracture. Bone marrow signal intensity diffusely heterogeneous and decreased on T1 weighted imaging, nonspecific, but most commonly related to anemia, smoking, or obesity. 1.7 cm T2/STIR hyperintense lesion noted within the T10 vertebral body, reflecting atypical hemangioma. No other worrisome osseous lesions. No abnormal marrow edema. SPINAL CORD: Grossly normal signal and morphology seen within the thoracic spinal cord on this motion degraded exam. SOFT TISSUES: Visualized kidneys are atrophic in appearance. Paraspinal soft tissues otherwise unremarkable. DEGENERATIVE CHANGES: T1-T2: Mild disc bulge with endplate spurring. No spinal stenosis. Foramina remain patent. T2-T3: Mild disc bulge. No spinal stenosis. Foramina remain patent. T3-T4: Mild left eccentric disc bulge with endplate spurring. No spinal stenosis. Foramina remain patent. T4-T5: Minimal endplate spurring without disc bulge. Mild facet hypertrophy. No stenosis. T5-T6: Mild disc bulge with endplate spurring. Mild facet hypertrophy. No spinal stenosis. Mild right foraminal narrowing. Left neural foraminal remains patent. T6-T7:  Mild endplate spurring without disc bulge. Mild facet hypertrophy. No stenosis. T7-T8:  Mild disc bulge with endplate spurring. No stenosis. T8-T9: Shallow left paracentral disc protrusion mildly flattened the ventral thecal sac. No spinal stenosis. Foramina remain patent. T9-T10: Broad based right paracentral foraminal disc protrusion with endplate spurring. No spinal stenosis. Mild right foraminal narrowing. T10-T11: Endplate spurring without significant disc bulge. Bilateral facet hypertrophy. No spinal stenosis. Foramen remain patent. T11-T12: Mild disc bulge with endplate spurring. Mild facet hypertrophy. No spinal stenosis. Foramen remain patent. T12-L1: Retrolisthesis with diffuse disc bulge and endplate spurring. Mild facet hypertrophy. IMPRESSION: 1. Motion degraded exam. 2. No acute thoracic spine findings detected within the limitations of motion-degraded imaging 3. Multilevel thoracic spondylosis as detailed above. No significant spinal stenosis. Mild to moderate foraminal narrowing on the right at T5-T6 and T9-T10, and bilaterally at T12-T1. Electronically signed by: Morene Hoard MD 10/19/2023 07:04 PM EDT RP Workstation: [id number]    Medications:  sodium thiosulfate  25 g in sodium chloride  0.9 % 200 mL Infusion for Calciphylaxis 25 g (10/19/23 1110)    cadexomer iodine    Topical QODAY   Chlorhexidine  Gluconate Cloth  6 each Topical Q0600   cinacalcet   180 mg Oral Q M,W,F-1800   darbepoetin (ARANESP ) injection - DIALYSIS  100 mcg Subcutaneous Q Sun-1800   feeding supplement (NEPRO CARB STEADY)  237 mL Oral BID BM   fluticasone  furoate-vilanterol  1 puff Inhalation Daily   gabapentin   200 mg Oral QHS   heparin   5,000 Units Subcutaneous Q8H   megestrol   20 mg Oral BID   pantoprazole   40 mg Oral Daily   polyethylene glycol  17 g Oral BID   senna-docusate  2 tablet Oral QHS   sevelamer  carbonate  2,400 mg Oral TID WC    Dialysis Orders: [facility] MWF  4 hrs 2K/2Ca   122.7kg  B450  AVF   *Heparin  4800 unit bolus with HD; 4000 unit intermittently with HD PRN* Sensipar   180 mg three times per week Mircera 100 mcg q 2 weeks Sodium thiosulfate  25 g IV three times per week  Assessment/Plan: Bilateral pubic rami fractures: s/p sustaining a mechanical fall.  Nonoperative management.  Per primary/ ortho Lower extremity weakness: S/p MRI thoracolumbar spine which showed negative pathology.  ESRD: MWF HD. Next HD 10/17. 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 Received mircera, will redose aranesp  100 mcg q Sun Metabolic Bone Disease: on sevelamer , sensipar ; no vit D.  Calciphylaxis: Na thio with dialysis, wound care and pain management.   Dispo: Per note, PT recommended SNF. Working on placement.  Charmaine Piety, NP [facility] 10/20/2023,3:55 PM  LOS: 5 days

## 2023-10-20 NOTE — Progress Notes (Signed)
 PROGRESS NOTE    [name]  FMW:[id number] DOB: 04-16-1975 DOA: 10/14/2023 PCP: Luvenia Sherry, FNP   Brief Narrative:  [name] is a 48 y.o. 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 Patient was found to have left sacral alar fracture along with bilateral superior and inferior pubic rami fracture. Patient was unable to ambulate. She was hospitalized for further management.  Details below.  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  Patient is still complaining of bilateral lower extremity weakness, left more than the right.  Prior to this fall, patient was able to walk with a walker.  Due to lower extremity weakness, obtained MRI thoracolumbar spine which did not show any acute pathology either.  Patient still continues to complain of significant pain, requesting to increase medications, will increase oxycodone  from 5 mg to 10 mg as needed.  Seen by PT OT, they recommended SNF, TOC is working on placement.   ESRD on HD mwf Metabolic bone  disease Calciphylaxis: Nephrology is following.  Getting hemodialysis.   Chronic systolic and diastolic CHF TTE 7975-OCZQ of 25 to 30%, G3DD.Currently euvolemic,Managed with hd Followed by Dr. Ladona with cardiology.   Calciphylaxis with chronic ulcerative wounds to bilateral lower extremities: Continue wound care   Anemia of ESRD Hb stable, continue aranesp , monitor hemoglobin   Leukocytosis Most likely reactive.  She is afebrile.  Trending down    History of asthma Stable.   Morbid Obesity w/ Body mass index is 40.26 kg/m.: Will benefit with PCP follow-up, weight loss,healthy lifestyle and outpatient sleep eval if not done.  DVT prophylaxis: heparin  injection 5,000 Units Start: 10/14/23 2200   Code Status: Full Code  Family Communication:  None present at bedside.  Plan of care discussed with patient in length and he/she verbalized understanding and agreed with it.  Status is: Inpatient Remains inpatient appropriate because: Pending placement.   Estimated body mass index is 39.12 kg/m as calculated from the following:   Height as of this encounter: 5' 8 (1.727 m).   Weight as of this encounter: 116.7 kg.    Nutritional Assessment: Body mass index is 39.12 kg/m.SABRA Seen by dietician.  I agree with the assessment and plan as outlined below: Nutrition Status:        . Skin Assessment: I have examined the patient's skin and I agree with the wound assessment as performed by the wound care RN as outlined below:    Consultants:  Nephrology and orthopedics  Procedures:  As above  Antimicrobials:  Anti-infectives (From admission, onward)    None         Subjective: Patient seen and  examined.  She had no complaints when I saw her other than leg pain and pelvic pain.  Objective: Vitals:   10/19/23 2351 10/20/23 0333 10/20/23 0806 10/20/23 0832  BP: 130/88 120/81 126/80   Pulse: 87 93 89 89  Resp: 19 20 18 18   Temp: 98.3 F (36.8 C) 98.7 F (37.1 C)  (!) 97.5 F (36.4 C)   TempSrc:   Oral   SpO2: 100% 99% 92% 92%  Weight:      Height:        Intake/Output Summary (Last 24 hours) at 10/20/2023 1054 Last data filed at 10/19/2023 2029 Gross per 24 hour  Intake 400 ml  Output 2000 ml  Net -1600 ml   Filed Weights   10/17/23 1450 10/19/23 0813 10/19/23 1233  Weight: 120.1 kg 116.8 kg 116.7 kg    Examination:  General exam: Appears calm and comfortable, obese Respiratory system: Clear to auscultation. Respiratory effort normal. Cardiovascular system: S1 & S2 heard, RRR. No JVD, murmurs, rubs, gallops or clicks. No pedal edema. Gastrointestinal system: Abdomen is nondistended, soft and nontender. No organomegaly or masses felt. Normal bowel sounds heard. Central nervous system: Alert and oriented.  0/5 power in left upper extremity and 1/5 power in right lower extremity. Skin: Has dressings in bilateral lower extremities due to wounds. Psychiatry: Judgement and insight appear normal. Mood & affect appropriate.    Data Reviewed: I have personally reviewed following labs and imaging studies  CBC: Recent Labs  Lab 10/14/23 1942 10/15/23 0920 10/16/23 0720 10/17/23 0942 10/19/23 0143  WBC 19.1* 13.6* 13.1* 13.2* 10.7*  NEUTROABS  --   --   --   --  7.7  HGB 9.3* 8.1* 8.8* 8.9* 8.8*  HCT 30.1* 25.5* 28.3* 27.8* 27.8*  MCV 99.3 97.3 97.3 95.5 94.9  PLT 290 244 210 251 256   Basic Metabolic Panel: Recent Labs  Lab 10/14/23 1942 10/15/23 0920 10/17/23 0942 10/19/23 0143  NA 139 136 133* 135  K 3.7 4.0 4.6 4.4  CL 89* 90* 92* 92*  CO2 24 23 23 22   GLUCOSE 80 91 89 90  BUN 32* 33* 32* 32*  CREATININE 9.01* 10.19* 8.95* 7.92*  CALCIUM  10.7* 9.1 9.5 9.9  PHOS  --   --   --  4.6   GFR: Estimated Creatinine Clearance: 11.5 mL/min (A) (by C-G formula based on SCr of 7.92 mg/dL (H)). Liver Function Tests: Recent Labs  Lab 10/19/23 0143  ALBUMIN 2.1*   No results for input(s): LIPASE, AMYLASE in the last 168  hours. No results for input(s): AMMONIA in the last 168 hours. Coagulation Profile: No results for input(s): INR, PROTIME in the last 168 hours. Cardiac Enzymes: No results for input(s): CKTOTAL, CKMB, CKMBINDEX, TROPONINI in the last 168 hours. BNP (last 3 results) No results for input(s): PROBNP in the last 8760 hours. HbA1C: No results for input(s): HGBA1C in the last 72 hours. CBG: Recent Labs  Lab 10/18/23 1212  GLUCAP 90   Lipid Profile: No results for input(s): CHOL, HDL, LDLCALC, TRIG, CHOLHDL, LDLDIRECT in the last 72 hours. Thyroid Function Tests: No results for input(s): TSH, T4TOTAL, FREET4, T3FREE, THYROIDAB in the last 72 hours. Anemia Panel: No results for input(s): VITAMINB12, FOLATE, FERRITIN, TIBC, IRON , RETICCTPCT in the last 72 hours. Sepsis Labs: No results for input(s): PROCALCITON, LATICACIDVEN in the last 168 hours.  No results found for this or any previous visit (from the past 240 hours).   Radiology Studies: MR LUMBAR SPINE WO  CONTRAST Result Date: 10/20/2023 EXAM: MRI LUMBAR SPINE 10/20/2023 12:41:32 AM TECHNIQUE: Multiplanar multisequence MRI of the lumbar spine was performed without the administration of intravenous contrast. COMPARISON: Prior CT from 10/14/2023. CLINICAL HISTORY: Low back pain, no red flags, no prior management. FINDINGS: BONES AND ALIGNMENT: Exaggeration of the normal lumbar lordosis. Trace degenerative retrolisthesis of T12 on L1 and L1 on L2. Vertebral body heights are otherwise maintained, except for mild chronic wedging deformity of T12. Previously identified acute left sacral fracture is partially visualized and grossly stable from prior. No acute abnormality or recent fracture within the lumbar spine. Bone marrow signal demonstrates decreased T1 signal intensity throughout the visualized bone marrow, nonspecific, but most commonly related to anemia, smoking, or obesity. No  worrisome osseous lesions. Mild reactive marrow edema is noted about the L4-L5 facets bilaterally due to facet arthritis. SPINAL CORD: The conus medullaris terminates at the level of L1. SOFT TISSUES: Diffuse edema is noted within the paraspinal soft tissues about the left lumbosacral junction related to the acute sacral fracture (series 6, image 15). No frank hematoma formation or other loculated collection. VISUALIZED KIDNEYS: Visualized kidneys are atrophic bilaterally with a few small benign appearing T2 hyperintense cysts. T12-L1: Seen only on sagittal projection. Degenerative endplate spurring. Disc desiccation with diffuse disc bulge. Mild facet hypertrophy. No significant spinal stenosis. Mild bilateral foraminal narrowing. L1-L2: Disc desiccation with diffuse disc bulge. Reactive endplate spurring. No significant spinal stenosis. Moderate bilateral L1 foraminal narrowing. L2-L3: Disc desiccation with mild disc bulge. Disc bulging is slightly asymmetric to the left. Mild facet hypertrophy. No significant spinal stenosis. Moderate bilateral L2 foraminal narrowing. L3-L4: Disc desiccation with diffuse disc bulge. Reactive endplate spurring. Mild to moderate facet hypertrophy. No significant spinal stenosis. Moderate right worse than left L3 foraminal narrowing. L4-L5: Disc desiccation with diffuse disc bulge. Moderate right worse than left facet arthrosis with associated small joint effusions and reactive marrow edema. Mild ligamentum flavum hypertrophy. No more than mild spinal stenosis. Moderate right with mild left L4 foraminal narrowing. L5-S1: Disc desiccation with diffuse disc bulge and reactive endplate spurring. Superimposed tiny central disc protrusion with slight superior migration. Mild facet hypertrophy. Epidural lipomatosis. No significant spinal stenosis. Moderate bilateral L5 foraminal narrowing. IMPRESSION: 1. Previously identified acute left sacral fracture, partially visualized, grossly  stable compared to prior. 2. No other acute traumatic injury within the lumbar spine. 3. Multilevel lumbar spondylosis and facet arthrosis without significant spinal stenosis. Moderate bilateral L1 through L5 foraminal narrowing as above. 4. Moderate bilateral facet arthrosis at L4-L5 with reactive marrow edema. Finding could contribute to lower back pain. Electronically signed by: Morene Hoard MD 10/20/2023 01:12 AM EDT RP Workstation: [id number]   MR THORACIC SPINE WO CONTRAST Result Date: 10/19/2023 EXAM: MRI THORACIC SPINE WITHOUT INTRAVENOUS CONTRAST 10/19/2023 06:18:04 PM TECHNIQUE: Multiplanar multisequence MRI of the thoracic spine was performed without the administration of intravenous contrast. Examination moderately to severely degraded by motion artifact, limiting assessment. COMPARISON: Prior CT from 09/16/2023. CLINICAL HISTORY: Mid-back pain. Mid-back pain (pt moved during exam//best images obtained). FINDINGS: BONES AND ALIGNMENT: 3 mm degenerative retrolisthesis of T12 on L1. Alignment otherwise normal with preservation of the normal thoracic kyphosis. Vertebral body height maintained without acute or chronic fracture. Bone marrow signal intensity diffusely heterogeneous and decreased on T1 weighted imaging, nonspecific, but most commonly related to anemia, smoking, or obesity. 1.7 cm T2/STIR hyperintense lesion noted within the T10 vertebral body, reflecting atypical hemangioma. No other worrisome osseous lesions. No abnormal marrow edema. SPINAL CORD: Grossly normal  signal and morphology seen within the thoracic spinal cord on this motion degraded exam. SOFT TISSUES: Visualized kidneys are atrophic in appearance. Paraspinal soft tissues otherwise unremarkable. DEGENERATIVE CHANGES: T1-T2: Mild disc bulge with endplate spurring. No spinal stenosis. Foramina remain patent. T2-T3: Mild disc bulge. No spinal stenosis. Foramina remain patent. T3-T4: Mild left eccentric disc bulge with  endplate spurring. No spinal stenosis. Foramina remain patent. T4-T5: Minimal endplate spurring without disc bulge. Mild facet hypertrophy. No stenosis. T5-T6: Mild disc bulge with endplate spurring. Mild facet hypertrophy. No spinal stenosis. Mild right foraminal narrowing. Left neural foraminal remains patent. T6-T7: Mild endplate spurring without disc bulge. Mild facet hypertrophy. No stenosis. T7-T8: Mild disc bulge with endplate spurring. No stenosis. T8-T9: Shallow left paracentral disc protrusion mildly flattened the ventral thecal sac. No spinal stenosis. Foramina remain patent. T9-T10: Broad based right paracentral foraminal disc protrusion with endplate spurring. No spinal stenosis. Mild right foraminal narrowing. T10-T11: Endplate spurring without significant disc bulge. Bilateral facet hypertrophy. No spinal stenosis. Foramen remain patent. T11-T12: Mild disc bulge with endplate spurring. Mild facet hypertrophy. No spinal stenosis. Foramen remain patent. T12-L1: Retrolisthesis with diffuse disc bulge and endplate spurring. Mild facet hypertrophy. IMPRESSION: 1. Motion degraded exam. 2. No acute thoracic spine findings detected within the limitations of motion-degraded imaging 3. Multilevel thoracic spondylosis as detailed above. No significant spinal stenosis. Mild to moderate foraminal narrowing on the right at T5-T6 and T9-T10, and bilaterally at T12-T1. Electronically signed by: Morene Hoard MD 10/19/2023 07:04 PM EDT RP Workstation: [id number]    Scheduled Meds:  cadexomer iodine    Topical QODAY   Chlorhexidine  Gluconate Cloth  6 each Topical Q0600   cinacalcet   180 mg Oral Q M,W,F-1800   darbepoetin (ARANESP ) injection - DIALYSIS  100 mcg Subcutaneous Q Sun-1800   feeding supplement (NEPRO CARB STEADY)  237 mL Oral BID BM   fluticasone  furoate-vilanterol  1 puff Inhalation Daily   gabapentin   200 mg Oral QHS   heparin   5,000 Units Subcutaneous Q8H   megestrol   20 mg Oral BID    pantoprazole   40 mg Oral Daily   polyethylene glycol  17 g Oral BID   senna-docusate  2 tablet Oral QHS   sevelamer  carbonate  2,400 mg Oral TID WC   Continuous Infusions:  sodium thiosulfate  25 g in sodium chloride  0.9 % 200 mL Infusion for Calciphylaxis 25 g (10/19/23 1110)     LOS: 5 days   Fredia Skeeter, MD [facility]  10/20/2023, 10:54 AM   *Please note that this is a verbal dictation therefore any spelling or grammatical errors are due to the Dragon Medical One system interpretation.  Please page via Amion and do not message via secure chat for urgent patient care matters. Secure chat can be used for non urgent patient care matters.  How to contact the [facility] Attending or Consulting provider 7A - 7P or covering provider during after hours 7P -7A, for this patient?  Check the care team in [facility] and look for a) attending/consulting [facility] provider listed and b) the [facility] team listed. Page or secure chat 7A-7P. Log into [email or web address] and use [facility] universal password to access. If you do not have the password, please contact the hospital operator. Locate the [facility] provider you are looking for under [facility] and page to a number that you can be directly reached. If you still have difficulty reaching the provider, please page the [facility] (Director on Call) for the Hospitalists listed on amion for assistance.

## 2023-10-20 NOTE — Congregational Nurse Program (Signed)
 Pt was screaming and crying due to pain at 2020, also refusing MRI and wound dressing change. Oxy 5mg  was given, but wasn't effective. DO Regino), notified Dilaudid  1mg  ordered. 10mg  of oxy 10mg  and atarax 10mg  given for MRI and pt successfully completed imaging, but still refusing dressing changes due to pain.
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
## 2023-10-20 NOTE — Plan of Care (Signed)

## 2023-10-21 LAB — CBC WITH DIFFERENTIAL/PLATELET
Abs Immature Granulocytes: 0.08 K/uL — ABNORMAL HIGH (ref 0.00–0.07)
Basophils Absolute: 0.1 K/uL (ref 0.0–0.1)
Basophils Relative: 0 %
Eosinophils Absolute: 0.1 K/uL (ref 0.0–0.5)
Eosinophils Relative: 1 %
HCT: 27.2 % — ABNORMAL LOW (ref 36.0–46.0)
Hemoglobin: 8.6 g/dL — ABNORMAL LOW (ref 12.0–15.0)
Immature Granulocytes: 1 %
Lymphocytes Relative: 15 %
Lymphs Abs: 1.6 K/uL (ref 0.7–4.0)
MCH: 29.7 pg (ref 26.0–34.0)
MCHC: 31.6 g/dL (ref 30.0–36.0)
MCV: 93.8 fL (ref 80.0–100.0)
Monocytes Absolute: 1.2 K/uL — ABNORMAL HIGH (ref 0.1–1.0)
Monocytes Relative: 11 %
Neutro Abs: 8.2 K/uL — ABNORMAL HIGH (ref 1.7–7.7)
Neutrophils Relative %: 72 %
Platelets: 249 K/uL (ref 150–400)
RBC: 2.9 MIL/uL — ABNORMAL LOW (ref 3.87–5.11)
RDW: 15.7 % — ABNORMAL HIGH (ref 11.5–15.5)
WBC: 11.2 K/uL — ABNORMAL HIGH (ref 4.0–10.5)
nRBC: 0 % (ref 0.0–0.2)

## 2023-10-21 LAB — RENAL FUNCTION PANEL
Albumin: 2 g/dL — ABNORMAL LOW (ref 3.5–5.0)
Anion gap: 20 — ABNORMAL HIGH (ref 5–15)
BUN: 30 mg/dL — ABNORMAL HIGH (ref 6–20)
CO2: 22 mmol/L (ref 22–32)
Calcium: 10 mg/dL (ref 8.9–10.3)
Chloride: 92 mmol/L — ABNORMAL LOW (ref 98–111)
Creatinine, Ser: 7.08 mg/dL — ABNORMAL HIGH (ref 0.44–1.00)
GFR, Estimated: 7 mL/min — ABNORMAL LOW (ref >60)
Glucose, Bld: 96 mg/dL (ref 70–99)
Phosphorus: 4.4 mg/dL (ref 2.5–4.6)
Potassium: 4.5 mmol/L (ref 3.5–5.1)
Sodium: 134 mmol/L — ABNORMAL LOW (ref 135–145)

## 2023-10-21 MED ORDER — SYMBICORT 80-4.5 MCG/ACT IN AERO: 2.0000 | INHALATION_SPRAY | Freq: Every day | RESPIRATORY_TRACT | Status: AC

## 2023-10-21 MED ORDER — CADEXOMER IODINE 0.9 % EX GEL: CUTANEOUS | 0 refills | Status: AC

## 2023-10-21 MED ORDER — NEPRO/CARBSTEADY PO LIQD: 237.0000 mL | Freq: Two times a day (BID) | ORAL | 1 refills | Status: AC

## 2023-10-21 MED ORDER — POLYETHYLENE GLYCOL 3350 17 GM/SCOOP PO POWD: 17.0000 g | Freq: Two times a day (BID) | ORAL | Status: AC | PRN

## 2023-10-21 MED ORDER — DARBEPOETIN ALFA 100 MCG/0.5ML IJ SOSY: 100.0000 ug | PREFILLED_SYRINGE | INTRAMUSCULAR | Status: AC

## 2023-10-21 MED ORDER — PANTOPRAZOLE SODIUM 40 MG PO TBEC: 40.0000 mg | DELAYED_RELEASE_TABLET | Freq: Every day | ORAL | Status: AC

## 2023-10-21 MED ORDER — OXYCODONE HCL 5 MG PO TABS
ORAL_TABLET | ORAL | Status: AC
  Filled 2023-10-21: qty 1

## 2023-10-21 MED ORDER — LIDOCAINE HCL (PF) 1 % IJ SOLN: 5.0000 mL | INTRAMUSCULAR | Status: DC | PRN

## 2023-10-21 MED ORDER — METHOCARBAMOL 500 MG PO TABS: 500.0000 mg | ORAL_TABLET | Freq: Four times a day (QID) | ORAL | 0 refills | Status: AC | PRN

## 2023-10-21 MED ORDER — PENTAFLUOROPROP-TETRAFLUOROETH EX AERO: 1.0000 | INHALATION_SPRAY | CUTANEOUS | Status: DC | PRN

## 2023-10-21 MED ORDER — SODIUM THIOSULFATE 250 MG/ML IV SOLN: 25.0000 g | INTRAVENOUS | Status: AC

## 2023-10-21 MED ORDER — BISACODYL 5 MG PO TBEC: 5.0000 mg | DELAYED_RELEASE_TABLET | Freq: Every day | ORAL | 0 refills | Status: AC | PRN

## 2023-10-21 MED ORDER — ACETAMINOPHEN 325 MG PO TABS: 650.0000 mg | ORAL_TABLET | Freq: Four times a day (QID) | ORAL | Status: AC | PRN

## 2023-10-21 MED ORDER — SENNOSIDES-DOCUSATE SODIUM 8.6-50 MG PO TABS: 2.0000 | ORAL_TABLET | Freq: Every day | ORAL | 0 refills | Status: AC

## 2023-10-21 MED ORDER — HEPARIN SODIUM (PORCINE) 1000 UNIT/ML DIALYSIS: 1000.0000 [IU] | INTRAMUSCULAR | Status: DC | PRN

## 2023-10-21 MED ORDER — HYDROCODONE-ACETAMINOPHEN 5-325 MG PO TABS
ORAL_TABLET | ORAL | Status: AC
  Filled 2023-10-21: qty 1

## 2023-10-21 MED ORDER — DIPHENHYDRAMINE HCL 25 MG PO CAPS: 25.0000 mg | ORAL_CAPSULE | Freq: Four times a day (QID) | ORAL | 0 refills | Status: AC | PRN

## 2023-10-21 MED ORDER — ALTEPLASE 2 MG IJ SOLR: 2.0000 mg | Freq: Once | INTRAMUSCULAR | Status: DC | PRN

## 2023-10-21 MED ORDER — OXYCODONE HCL 10 MG PO TABS: 10.0000 mg | ORAL_TABLET | ORAL | 0 refills | Status: AC | PRN

## 2023-10-21 MED ORDER — OXYCODONE-ACETAMINOPHEN 5-325 MG PO TABS
ORAL_TABLET | ORAL | Status: AC
  Filled 2023-10-21: qty 1

## 2023-10-21 MED ORDER — HYDROCODONE-ACETAMINOPHEN 5-325 MG PO TABS
1.0000 | ORAL_TABLET | Freq: Once | ORAL | Status: AC
  Administered 2023-10-21: 1 via ORAL

## 2023-10-21 NOTE — Progress Notes (Signed)
 Report was unable to be given by day time RN. Phone number for [facility] given as [phone]. I called and pressed option 5 for admissions. No answer; goes to a voicemail box which states leave a message and press pound when finished repeatedly and does not allow message.  I called another time trying to reach the operator and finding similar results. Unable to give report and patient is confirmed for transportation some time this evening.

## 2023-10-21 NOTE — Progress Notes (Signed)
 OT Cancellation Note  Patient Details Name: [name] MRN: [id number] DOB: 1975/11/25   Cancelled Treatment:    Reason Eval/Treat Not Completed: Patient at procedure or test/ unavailable Patient at HD, OT will follow back as time permits for treatment.   Ronal Gift E. Jalaila Caradonna, OTR/L Acute Rehabilitation Services [phone]   Ronal Gift Salt 10/21/2023, 8:25 AM

## 2023-10-21 NOTE — Discharge Summary (Signed)
 Physician Discharge Summary  Patient ID: [name] MRN: [id number] DOB/AGE: [id number] 49 y.o.  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   Discharged Condition: stable  Hospital Course: Patient is a 48 year old female, obese, with past medical history significant for end-stage renal disease on hemodialysis on Monday, Wednesday and Friday, chronic combined systolic and diastolic CHF (EF 74-69%), calciphylaxis with chronic ulcerated wounds to bilateral lower extremities, anemia of ESRD and asthma.  Patient presented to the ED for evaluation of lower back pain after a fall. Patient was found to have left sacral alar fracture along with bilateral superior and inferior pubic rami fracture. Patient was unable to ambulate.  Patient was hospitalized for further management.  Orthopedic team was consulted to assist with patient's management.  The fracture was managed conservatively.  Patient will be discharged to a skilled nursing facility for further care.  Patient has been cleared for discharge.  B/L superior and inferior pubic rami fracture left sacral ala fracture Mechanical fall: - Orthopedic team was consulted to assist with management. - Orthopedic team recommended non-operative management, WBAT PT OT pain management and f/u  w/ Dr Barton (orthopedic surgeon) in 3 weeks.   - Pain management was optimized.   - Prior to this fall, patient was able to walk with a walker.  Due to lower extremity weakness, obtained MRI thoracolumbar spine which did not show any acute pathology  either.  -Patient will be discharged to skilled nursing facility for subacute rehab. - Continue hemodialysis  ESRD on HD mwf Metabolic bone disease Calciphylaxis: -Patient was dialyzed during the hospital stay. - Continue hemodialysis on discharge. - Continue treatment for calciphylaxis.     Chronic systolic and diastolic CHF -Transthoracic echocardiogram done 08/24/2022 revealed estimated LVEF of 25 to 30%, G3DD. -Currently euvolemic,Managed with hd -Patient will continue to follow-up with cardiology team on discharge.     Calciphylaxis with chronic ulcerative wounds to bilateral lower extremities: -Continue wound care - Continue sodium thiosulfate  with dialysis on discharge.   Anemia of ESRD -Last visualized hemoglobin of 8.6 g/dL. - Continue darbepoetin.   Leukocytosis -Chronic. - No signs of infection.   History of asthma Stable.   Morbid Obesity w/ Body mass index is 40.26 kg/m.: Will benefit with PCP follow-up, weight loss,healthy lifestyle and outpatient sleep eval if not done.    Consults: nephrology and orthopedic surgery  Significant Diagnostic Studies:  MRI lumbar spine without contrast revealed: 1. Previously identified acute left sacral fracture, partially visualized, grossly stable compared to prior. 2. No other acute traumatic injury within the lumbar spine. 3. Multilevel lumbar spondylosis and facet arthrosis without significant spinal stenosis. Moderate bilateral L1 through L5 foraminal narrowing as above. 4. Moderate bilateral facet arthrosis at L4-L5 with reactive marrow edema. Finding could contribute to lower back pain.  MRI thoracic spine without contrast revealed: 1. Motion degraded exam. 2. No acute thoracic spine findings detected within the limitations of motion-degraded imaging 3. Multilevel thoracic spondylosis as detailed above. No significant spinal stenosis. Mild to moderate foraminal narrowing on the right at T5-T6 and T9-T10, and  bilaterally at T12-T1.  CT abdomen and  pelvis without contrast revealed: Bilateral superior and inferior pubic rami fractures. Left sacral ala fracture.   Coronary artery disease, aortic atherosclerosis.   Cholelithiasis.   Bilateral nephrolithiasis.  No hydronephrosis.  Discharge Exam: Blood pressure 103/65, pulse 91, temperature (!) 97.5 F (36.4 C), resp. rate (!) 22, height 5' 8 (1.727 m), weight 114.1 kg, SpO2 100%.   Disposition: Discharge disposition: 03-Skilled Nursing Facility       Discharge Instructions     Diet - low sodium heart healthy   Complete by: As directed    Discharge wound care:   Complete by: As directed    Continue current wound care pattern   Increase activity slowly   Complete by: As directed       Allergies as of 10/21/2023       Reactions   Pollen Extract Itching        Medication List     STOP taking these medications    bacitracin  ointment   Back & Body Extra Strength 500-32.5 MG Tabs Generic drug: Aspirin-Caffeine   docusate sodium  100 MG capsule Commonly known as: COLACE   HYDROcodone -acetaminophen  5-325 MG tablet Commonly known as: NORCO/VICODIN   oxyCODONE -acetaminophen  10-325 MG tablet Commonly known as: PERCOCET       TAKE these medications    acetaminophen  325 MG tablet Commonly known as: TYLENOL  Take 2 tablets (650 mg total) by mouth every 6 (six) hours as needed for mild pain (pain score 1-3) or fever (or Fever >/= 101).   albuterol  108 (90 Base) MCG/ACT inhaler Commonly known as: VENTOLIN  HFA Inhale 1-2 puffs into the lungs every 6 (six) hours as needed for wheezing or shortness of breath.   bisacodyl 5 MG EC tablet Commonly known as: DULCOLAX Take 1 tablet (5 mg total) by mouth daily as needed for moderate constipation.   cadexomer iodine  0.9 % gel Commonly known as: IODOSORB Apply topically every other day. Start taking on: October 23, 2023   cinacalcet  30 MG tablet Commonly known as:  SENSIPAR  Take 6 tablets (180 mg total) by mouth every Monday, Wednesday, and Friday at 6 PM. What changed: how much to take   Darbepoetin Alfa  100 MCG/0.5ML Sosy injection Commonly known as: ARANESP  Inject 0.5 mLs (100 mcg total) into the skin every Sunday at 6pm. Start taking on: October 23, 2023   diphenhydrAMINE  25 mg capsule Commonly known as: BENADRYL  Take 1 capsule (25 mg total) by mouth every 6 (six) hours as needed for itching.   feeding supplement (NEPRO CARB STEADY) Liqd Take 237 mLs by mouth 2 (two) times daily between meals. Start taking on: October 22, 2023   gabapentin  100 MG capsule Commonly known as: NEURONTIN  Take 200 mg by mouth 2 (two) times daily.   megestrol  20 MG tablet Commonly known as: MEGACE  Take 1 tablet (20 mg total) by mouth daily. What changed: when to take this   methocarbamol  500 MG tablet Commonly known as: ROBAXIN  Take 1 tablet (500 mg total) by mouth every 6 (six) hours as needed for muscle spasms.   Oxycodone  HCl 10 MG Tabs Take 1 tablet (10 mg total) by mouth every 4 (four) hours as needed for moderate pain (pain score 4-6).   pantoprazole  40 MG tablet Commonly known as: Protonix  Take 1 tablet (40 mg total) by mouth daily before breakfast.   polyethylene glycol powder 17 GM/SCOOP powder Commonly known as: GLYCOLAX /MIRALAX  Take 17 g by mouth 2 (two) times daily as needed for mild constipation or moderate constipation.  senna-docusate 8.6-50 MG tablet Commonly known as: Senokot-S Take 2 tablets by mouth at bedtime.   sevelamer  carbonate 800 MG tablet Commonly known as: RENVELA  Take 2,400 mg by mouth 3 (three) times daily after meals.   sodium chloride  0.9 % SOLN 100 mL with sodium thiosulfate  250 MG/ML SOLN 25 g Inject 25 g into the vein every Monday, Wednesday, and Friday with hemodialysis. Start taking on: October 24, 2023   Symbicort 80-4.5 MCG/ACT inhaler Generic drug: budesonide-formoterol Inhale 2 puffs into the lungs  daily.               Discharge Care Instructions  (From admission, onward)           Start     Ordered   10/21/23 0000  Discharge wound care:       Comments: Continue current wound care pattern   10/21/23 1500            Contact information for follow-up providers     Luvenia Sherry, FNP Follow up in 1 week(s).   Specialty: Nurse Practitioner Contact information: [address] [place] [address] [phone]              Contact information for after-discharge care     Destination     [facility] .   Service: Skilled Nursing Contact information: [address] [place]  [address] [phone]                    Time spent: 35 minutes.  SignedBETHA Leatrice LILLETTE Rosario 10/21/2023, 3:04 PM

## 2023-10-21 NOTE — Plan of Care (Signed)

## 2023-10-21 NOTE — Progress Notes (Addendum)
 [facility] Progress Note   Subjective:    Seen and examined patient on HD. Pre-HD bed weight was under current EDW so set UFG 1L. So far tolerating treatment. She's c/o ongoing b/l leg pain. On PO Oxy and Robaxin . Awaiting SNF placement.  Objective Vitals:   10/21/23 0900 10/21/23 0930 10/21/23 1000 10/21/23 1030  BP: 114/77 112/71 133/82 123/76  Pulse: 85 87  94  Resp: 15 19  (!) 21  Temp:      TempSrc:      SpO2: 99% 100%  92%  Weight:      Height:       Physical Exam General: Awake, alert, NAD, obese Heart: S1 and S2; No murmurs, gallops, or rubs Lungs: Clear throughout; No wheezing, rales, or rhonchi Abdomen: Large, soft, and non-tender Extremities: No LE edema; legs wrapped 2nd calciphylaxis Dialysis Access: L AVF   Filed Weights   10/19/23 0813 10/19/23 1233 10/21/23 0824  Weight: 116.8 kg 116.7 kg 114.6 kg   No intake or output data in the 24 hours ending 10/21/23 1059  Additional Objective Labs: Basic Metabolic Panel: Recent Labs  Lab 10/17/23 0942 10/19/23 0143 10/21/23 0147  NA 133* 135 134*  K 4.6 4.4 4.5  CL 92* 92* 92*  CO2 23 22 22   GLUCOSE 89 90 96  BUN 32* 32* 30*  CREATININE 8.95* 7.92* 7.08*  CALCIUM  9.5 9.9 10.0  PHOS  --  4.6 4.4   Liver Function Tests: Recent Labs  Lab 10/19/23 0143 10/21/23 0147  ALBUMIN 2.1* 2.0*   No results for input(s): LIPASE, AMYLASE in the last 168 hours. CBC: Recent Labs  Lab 10/15/23 0920 10/16/23 0720 10/17/23 0942 10/19/23 0143 10/21/23 0147  WBC 13.6* 13.1* 13.2* 10.7* 11.2*  NEUTROABS  --   --   --  7.7 8.2*  HGB 8.1* 8.8* 8.9* 8.8* 8.6*  HCT 25.5* 28.3* 27.8* 27.8* 27.2*  MCV 97.3 97.3 95.5 94.9 93.8  PLT 244 210 251 256 249   Blood Culture    Component Value Date/Time   SDES URINE, CLEAN CATCH 02/20/2008 0755   SPECREQUEST NONE 02/20/2008 0755   CULT  02/20/2008 0755    Multiple bacterial morphotypes present, none predominant. Suggest appropriate recollection if  clinically indicated.   REPTSTATUS 02/21/2008 FINAL 02/20/2008 0755    Cardiac Enzymes: No results for input(s): CKTOTAL, CKMB, CKMBINDEX, TROPONINI in the last 168 hours. CBG: Recent Labs  Lab 10/18/23 1212  GLUCAP 90   Iron  Studies: No results for input(s): IRON , TIBC, TRANSFERRIN, FERRITIN in the last 72 hours. Lab Results  Component Value Date   INR 1.3 (H) 09/13/2023   INR 1.21 02/28/2014   INR 1.19 01/28/2013   Studies/Results: MR LUMBAR SPINE WO CONTRAST Result Date: 10/20/2023 EXAM: MRI LUMBAR SPINE 10/20/2023 12:41:32 AM TECHNIQUE: Multiplanar multisequence MRI of the lumbar spine was performed without the administration of intravenous contrast. COMPARISON: Prior CT from 10/14/2023. CLINICAL HISTORY: Low back pain, no red flags, no prior management. FINDINGS: BONES AND ALIGNMENT: Exaggeration of the normal lumbar lordosis. Trace degenerative retrolisthesis of T12 on L1 and L1 on L2. Vertebral body heights are otherwise maintained, except for mild chronic wedging deformity of T12. Previously identified acute left sacral fracture is partially visualized and grossly stable from prior. No acute abnormality or recent fracture within the lumbar spine. Bone marrow signal demonstrates decreased T1 signal intensity throughout the visualized bone marrow, nonspecific, but most commonly related to anemia, smoking, or obesity. No worrisome osseous lesions. Mild reactive  marrow edema is noted about the L4-L5 facets bilaterally due to facet arthritis. SPINAL CORD: The conus medullaris terminates at the level of L1. SOFT TISSUES: Diffuse edema is noted within the paraspinal soft tissues about the left lumbosacral junction related to the acute sacral fracture (series 6, image 15). No frank hematoma formation or other loculated collection. VISUALIZED KIDNEYS: Visualized kidneys are atrophic bilaterally with a few small benign appearing T2 hyperintense cysts. T12-L1: Seen only on sagittal  projection. Degenerative endplate spurring. Disc desiccation with diffuse disc bulge. Mild facet hypertrophy. No significant spinal stenosis. Mild bilateral foraminal narrowing. L1-L2: Disc desiccation with diffuse disc bulge. Reactive endplate spurring. No significant spinal stenosis. Moderate bilateral L1 foraminal narrowing. L2-L3: Disc desiccation with mild disc bulge. Disc bulging is slightly asymmetric to the left. Mild facet hypertrophy. No significant spinal stenosis. Moderate bilateral L2 foraminal narrowing. L3-L4: Disc desiccation with diffuse disc bulge. Reactive endplate spurring. Mild to moderate facet hypertrophy. No significant spinal stenosis. Moderate right worse than left L3 foraminal narrowing. L4-L5: Disc desiccation with diffuse disc bulge. Moderate right worse than left facet arthrosis with associated small joint effusions and reactive marrow edema. Mild ligamentum flavum hypertrophy. No more than mild spinal stenosis. Moderate right with mild left L4 foraminal narrowing. L5-S1: Disc desiccation with diffuse disc bulge and reactive endplate spurring. Superimposed tiny central disc protrusion with slight superior migration. Mild facet hypertrophy. Epidural lipomatosis. No significant spinal stenosis. Moderate bilateral L5 foraminal narrowing. IMPRESSION: 1. Previously identified acute left sacral fracture, partially visualized, grossly stable compared to prior. 2. No other acute traumatic injury within the lumbar spine. 3. Multilevel lumbar spondylosis and facet arthrosis without significant spinal stenosis. Moderate bilateral L1 through L5 foraminal narrowing as above. 4. Moderate bilateral facet arthrosis at L4-L5 with reactive marrow edema. Finding could contribute to lower back pain. Electronically signed by: Morene Hoard MD 10/20/2023 01:12 AM EDT RP Workstation: [id number]   MR THORACIC SPINE WO CONTRAST Result Date: 10/19/2023 EXAM: MRI THORACIC SPINE WITHOUT INTRAVENOUS  CONTRAST 10/19/2023 06:18:04 PM TECHNIQUE: Multiplanar multisequence MRI of the thoracic spine was performed without the administration of intravenous contrast. Examination moderately to severely degraded by motion artifact, limiting assessment. COMPARISON: Prior CT from 09/16/2023. CLINICAL HISTORY: Mid-back pain. Mid-back pain (pt moved during exam//best images obtained). FINDINGS: BONES AND ALIGNMENT: 3 mm degenerative retrolisthesis of T12 on L1. Alignment otherwise normal with preservation of the normal thoracic kyphosis. Vertebral body height maintained without acute or chronic fracture. Bone marrow signal intensity diffusely heterogeneous and decreased on T1 weighted imaging, nonspecific, but most commonly related to anemia, smoking, or obesity. 1.7 cm T2/STIR hyperintense lesion noted within the T10 vertebral body, reflecting atypical hemangioma. No other worrisome osseous lesions. No abnormal marrow edema. SPINAL CORD: Grossly normal signal and morphology seen within the thoracic spinal cord on this motion degraded exam. SOFT TISSUES: Visualized kidneys are atrophic in appearance. Paraspinal soft tissues otherwise unremarkable. DEGENERATIVE CHANGES: T1-T2: Mild disc bulge with endplate spurring. No spinal stenosis. Foramina remain patent. T2-T3: Mild disc bulge. No spinal stenosis. Foramina remain patent. T3-T4: Mild left eccentric disc bulge with endplate spurring. No spinal stenosis. Foramina remain patent. T4-T5: Minimal endplate spurring without disc bulge. Mild facet hypertrophy. No stenosis. T5-T6: Mild disc bulge with endplate spurring. Mild facet hypertrophy. No spinal stenosis. Mild right foraminal narrowing. Left neural foraminal remains patent. T6-T7: Mild endplate spurring without disc bulge. Mild facet hypertrophy. No stenosis. T7-T8: Mild disc bulge with endplate spurring. No stenosis. T8-T9: Shallow left paracentral disc protrusion mildly flattened the  ventral thecal sac. No spinal stenosis.  Foramina remain patent. T9-T10: Broad based right paracentral foraminal disc protrusion with endplate spurring. No spinal stenosis. Mild right foraminal narrowing. T10-T11: Endplate spurring without significant disc bulge. Bilateral facet hypertrophy. No spinal stenosis. Foramen remain patent. T11-T12: Mild disc bulge with endplate spurring. Mild facet hypertrophy. No spinal stenosis. Foramen remain patent. T12-L1: Retrolisthesis with diffuse disc bulge and endplate spurring. Mild facet hypertrophy. IMPRESSION: 1. Motion degraded exam. 2. No acute thoracic spine findings detected within the limitations of motion-degraded imaging 3. Multilevel thoracic spondylosis as detailed above. No significant spinal stenosis. Mild to moderate foraminal narrowing on the right at T5-T6 and T9-T10, and bilaterally at T12-T1. Electronically signed by: Morene Hoard MD 10/19/2023 07:04 PM EDT RP Workstation: [id number]    Medications:  sodium thiosulfate  25 g in sodium chloride  0.9 % 200 mL Infusion for Calciphylaxis 25 g (10/19/23 1110)    cadexomer iodine    Topical QODAY   Chlorhexidine  Gluconate Cloth  6 each Topical Q0600   cinacalcet   180 mg Oral Q M,W,F-1800   darbepoetin (ARANESP ) injection - DIALYSIS  100 mcg Subcutaneous Q Sun-1800   feeding supplement (NEPRO CARB STEADY)  237 mL Oral BID BM   fluticasone  furoate-vilanterol  1 puff Inhalation Daily   gabapentin   200 mg Oral QHS   heparin   5,000 Units Subcutaneous Q8H   megestrol   20 mg Oral BID   pantoprazole   40 mg Oral Daily   polyethylene glycol  17 g Oral BID   senna-docusate  2 tablet Oral QHS   sevelamer  carbonate  2,400 mg Oral TID WC    Dialysis Orders: [facility] MWF  4 hrs 2K/2Ca   122.7kg  B450  AVF   *Heparin  4800 unit bolus with HD; 4000 unit intermittently with HD PRN* Sensipar  180 mg three times per week Mircera 100 mcg q 2 weeks Sodium thiosulfate  25 g IV three times per week  Assessment/Plan: Bilateral pubic rami fractures: s/p  sustaining a mechanical fall.  Nonoperative management.  Per primary/ ortho Lower extremity weakness: S/p MRI thoracolumbar spine which showed negative pathology.  ESRD: MWF HD. On HD. Hypertension: reasonably well controlled now Volume: Euvolemic on exam. Under EDW here. Consider lowering at discharge. Anemia of ESRD: no iron  containing products. Received mircera, will redose aranesp  100 mcg q Sun Metabolic Bone Disease: on sevelamer , sensipar ; no vit D.  Calciphylaxis: Na thio with dialysis, wound care and pain management.  Informed today by HD RN of ongoing pain despite current pain med regimen. Will consider palliative care referral for symptom management recommendations. Dispo: Awaiting SNF placement  Charmaine Piety, NP [facility] 10/21/2023,10:59 AM  LOS: 6 days

## 2023-10-21 NOTE — TOC Progression Note (Addendum)
 Transition of Care [facility]) - Progression Note    Patient Details  Name: [name] MRN: [id number] Date of Birth: 06/17/1975  [facility] CM/SW Contact  Sherline Clack, [place] Phone Number: 10/21/2023, 8:25 AM  Clinical Narrative:     Update 1:16 PM: Facility waiting on discharges for an open bed at this time. CSW requested facility reach out when a bed opens up. Provider made aware  CSW submitted insurance authorization for [employer]. Insurance auth was approved 10/17-10/21, auth ID: [id number]. Provider, facility, and patient made aware. CSW will continue to follow as discharge plan develops.   Expected Discharge Plan: (P) Skilled Nursing Facility Barriers to Discharge: (P) Continued Medical Work up               Expected Discharge Plan and Services                                               Social Drivers of Health (SDOH) Interventions SDOH Screenings   Food Insecurity: No Food Insecurity (10/15/2023)  Housing: Unknown (10/15/2023)  Transportation Needs: Unmet Transportation Needs (10/15/2023)  Utilities: Not At Risk (10/15/2023)  Alcohol Screen: Low Risk  (02/09/2022)  Depression (PHQ2-9): Low Risk  (10/21/2020)  Financial Resource Strain: Low Risk  (02/09/2022)  Physical Activity: Insufficiently Active (02/09/2022)  Social Connections: Moderately Integrated (09/15/2023)  Stress: No Stress Concern Present (02/09/2022)  Tobacco Use: Low Risk  (10/15/2023)    Readmission Risk Interventions     No data to display

## 2023-10-21 NOTE — Discharge Planning (Signed)
 [facility] Patient Discharge Orders- [facility] CLINIC: [place]  Patient's name: [name] Admit/DC Dates: 10/14/2023 - Anticipate DC 10/17-DC to SNF  Discharge Diagnoses: L sacral fracture from mechanical fall - Ortho recommended non-operative management. She was concerned of overall wekaness. DC to SNF for rehab  Calciphylaxis - on Na Thio  Aranesp : Given: Yes    Date and amount of last dose: 100mcg on 10/12   Last Hgb: 8.6 PRBC's Given: No ESA dose for discharge: Resume mircera 100 mcg IV q 2 weeks  IV Iron  dose at discharge: N/A  Heparin  change: No  EDW Change: Yes New EDW: Lower 115kg. Notify renal provider if she cannot tolerate new EDW. Weights were under here.  Bath Change: No  Access intervention/Change: No  Hectorol /Calcitriol  change: N/A. No Ca supplements given calciphylaxis  Discharge Labs: Calcium  10  Phosphorus 4.4  Albumin 2.0  K+ 4.5  IV Antibiotics: No  On Coumadin?: No   OTHER/APPTS/LAB ORDERS:  Continue Na Thiosulfate IV 25GM with HD For renal provider: consider palliative care referral for symptom management. Ongoing pain from calciphylaxis. I recommend talking with her about that first!    D/C Meds to be reconciled by nurse after every discharge.  Completed By: Charmaine Piety, NP   Reviewed by: MD:______ RN_______

## 2023-10-21 NOTE — Progress Notes (Signed)
 D/c ordered noted. CM/SW notified that SNF [facility] will be able to transport pt to and from HD. Contacted out-pt HD clinic, [facility], to inform of pt d/c to SNF and anticipated arrival back at clinic on Monday following schedule. No further support needed.   Saydee Zolman [facility] [phone]

## 2023-10-21 NOTE — Progress Notes (Signed)
 Received patient in bed.Alert and oriented x 4. Consent verified.  Access used: Left arm avf that worked well.   Duration of treatment: 3.5 hours.  Uf goal:Met 500 ml, tolerated treatment.  Medicine given:Robaxin  500 mg.                          Oxycodone  10 mg.                          Hydrocodone  1 tab.                          Sodium Thiosulfate  IV.  Hemo comment: Hand off to the patient's nurse with stable condition via transporter.

## 2023-10-21 NOTE — Plan of Care (Signed)

## 2023-10-25 ENCOUNTER — Encounter (HOSPITAL_BASED_OUTPATIENT_CLINIC_OR_DEPARTMENT_OTHER): Admitting: General Surgery

## 2023-10-25 DIAGNOSIS — L97822 Non-pressure chronic ulcer of other part of left lower leg with fat layer exposed: Secondary | ICD-10-CM | POA: Diagnosis not present

## 2023-10-25 DIAGNOSIS — N2581 Secondary hyperparathyroidism of renal origin: Secondary | ICD-10-CM | POA: Diagnosis not present

## 2023-10-25 DIAGNOSIS — Z992 Dependence on renal dialysis: Secondary | ICD-10-CM | POA: Diagnosis not present

## 2023-10-25 DIAGNOSIS — L97812 Non-pressure chronic ulcer of other part of right lower leg with fat layer exposed: Secondary | ICD-10-CM | POA: Diagnosis present

## 2023-10-25 DIAGNOSIS — N186 End stage renal disease: Secondary | ICD-10-CM | POA: Diagnosis not present

## 2023-10-25 IMAGING — MG MM DIGITAL SCREENING BILAT W/ TOMO AND CAD
6 of 10 series · 6 of 30 positions shown · non-contrast
Comparison: Previous exam(s).

CLINICAL DATA: Screening.

EXAM:
DIGITAL SCREENING BILATERAL MAMMOGRAM WITH TOMOSYNTHESIS AND CAD
TECHNIQUE: Bilateral screening digital craniocaudal and mediolateral oblique
mammograms were obtained. Bilateral screening digital breast
tomosynthesis was performed. The images were evaluated with
computer-aided detection.

[R CC synth-2D]
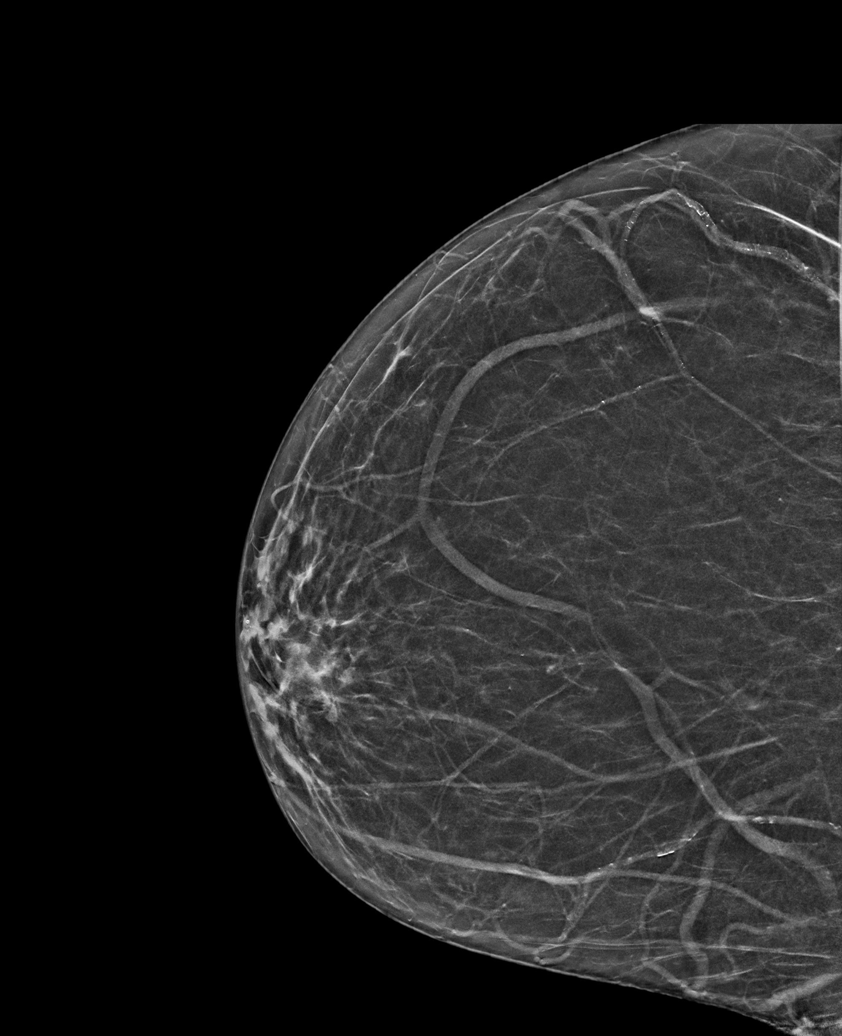

[R CV synth-2D]
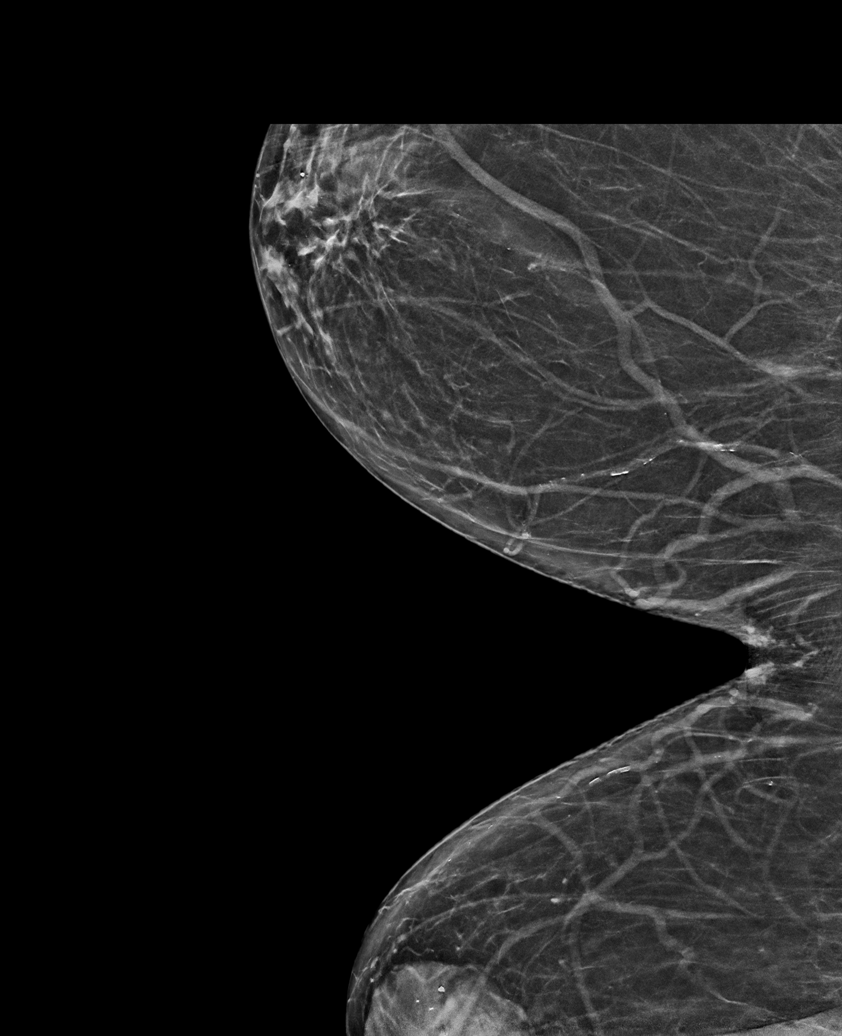

[L MLO synth-2D]
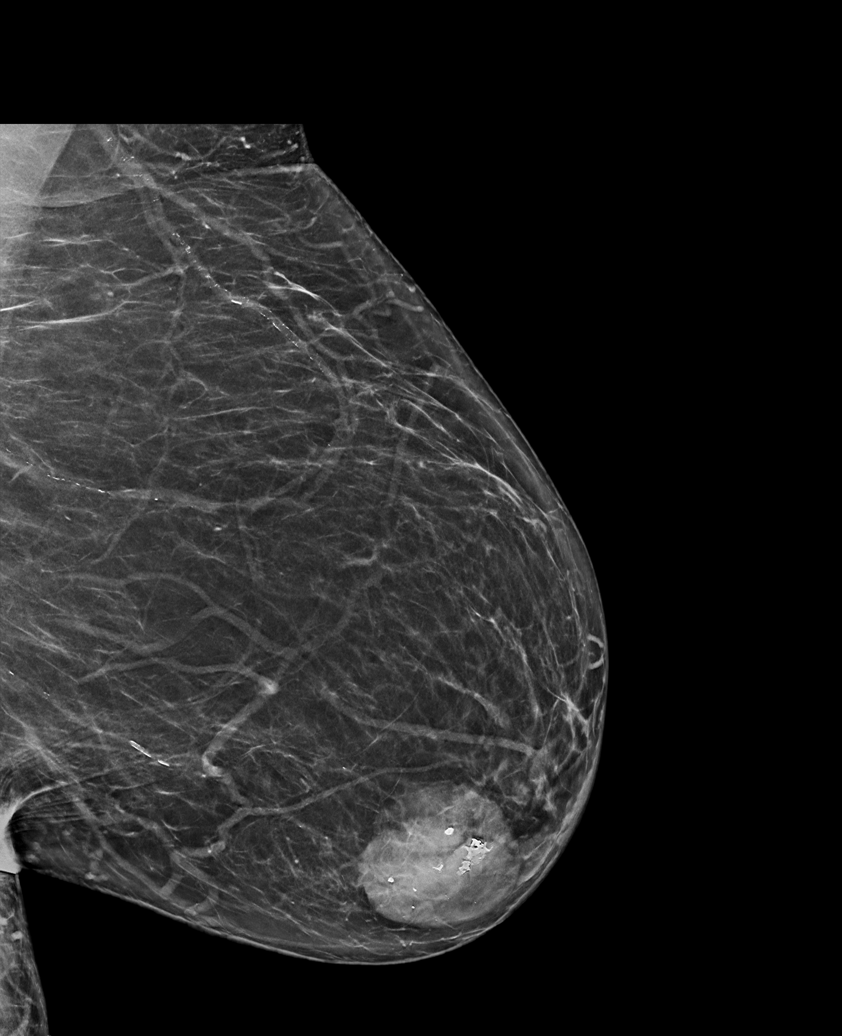

[R MLO synth-2D]
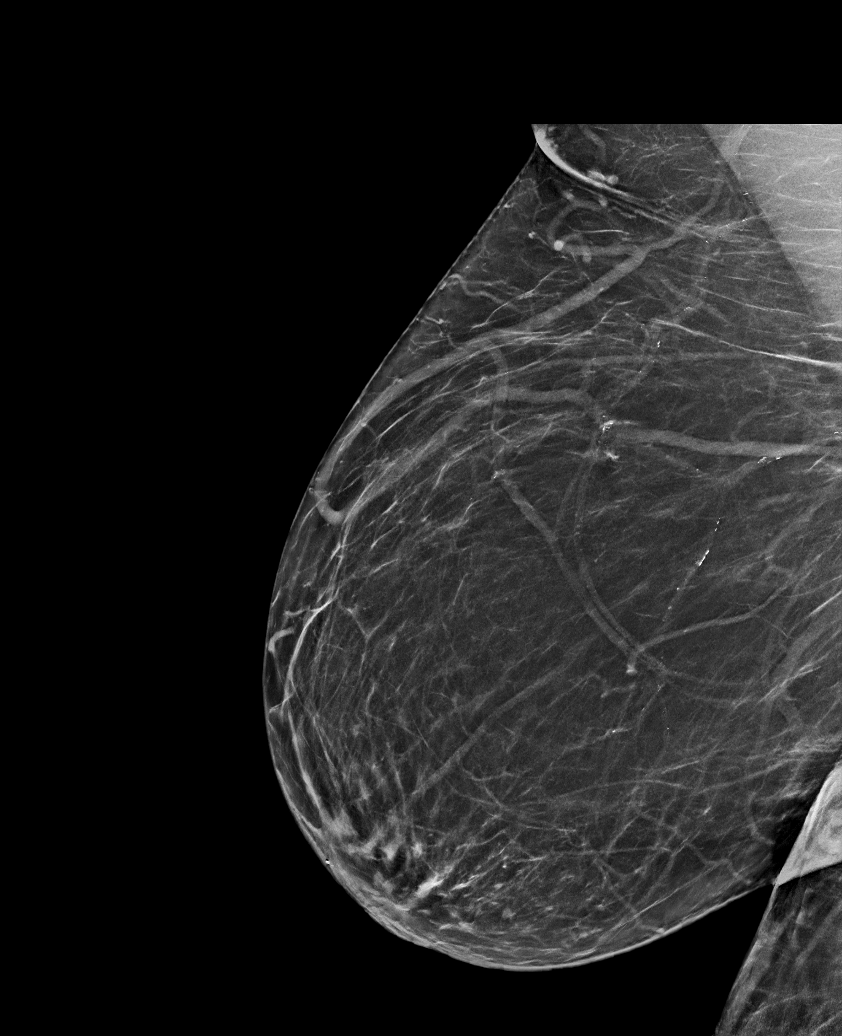

[L CC synth-2D]
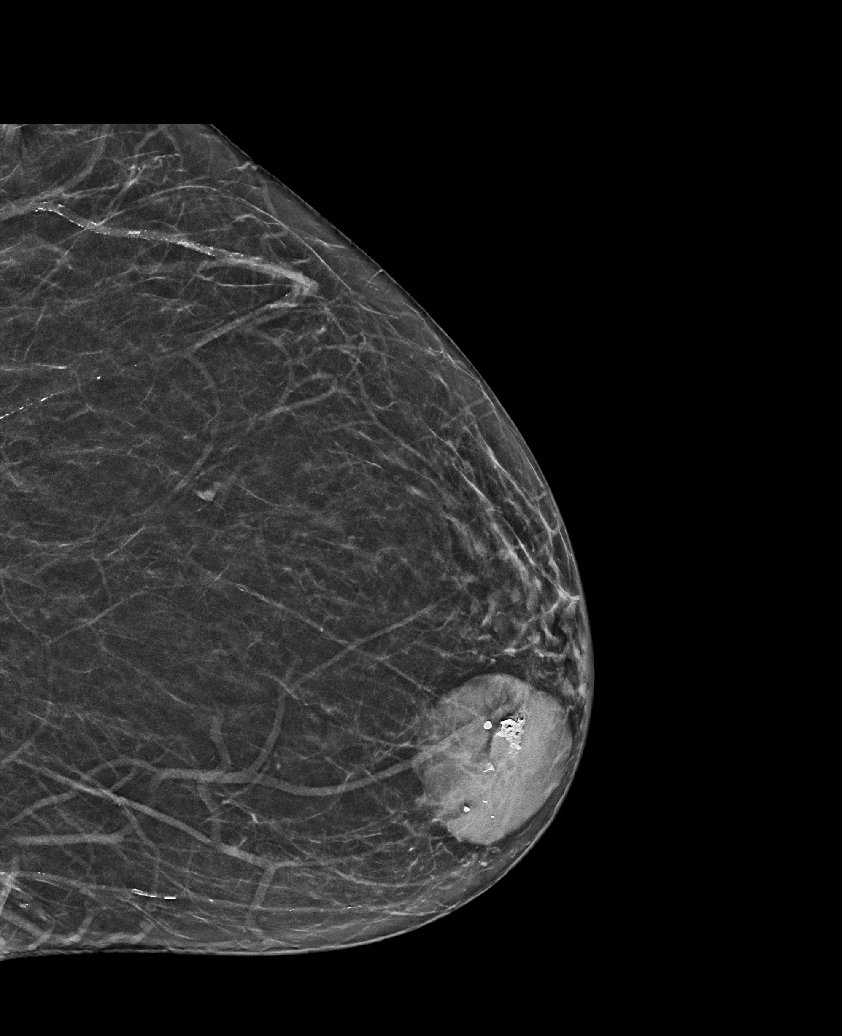

[L CC tomo · tomo slice 29/57.0]
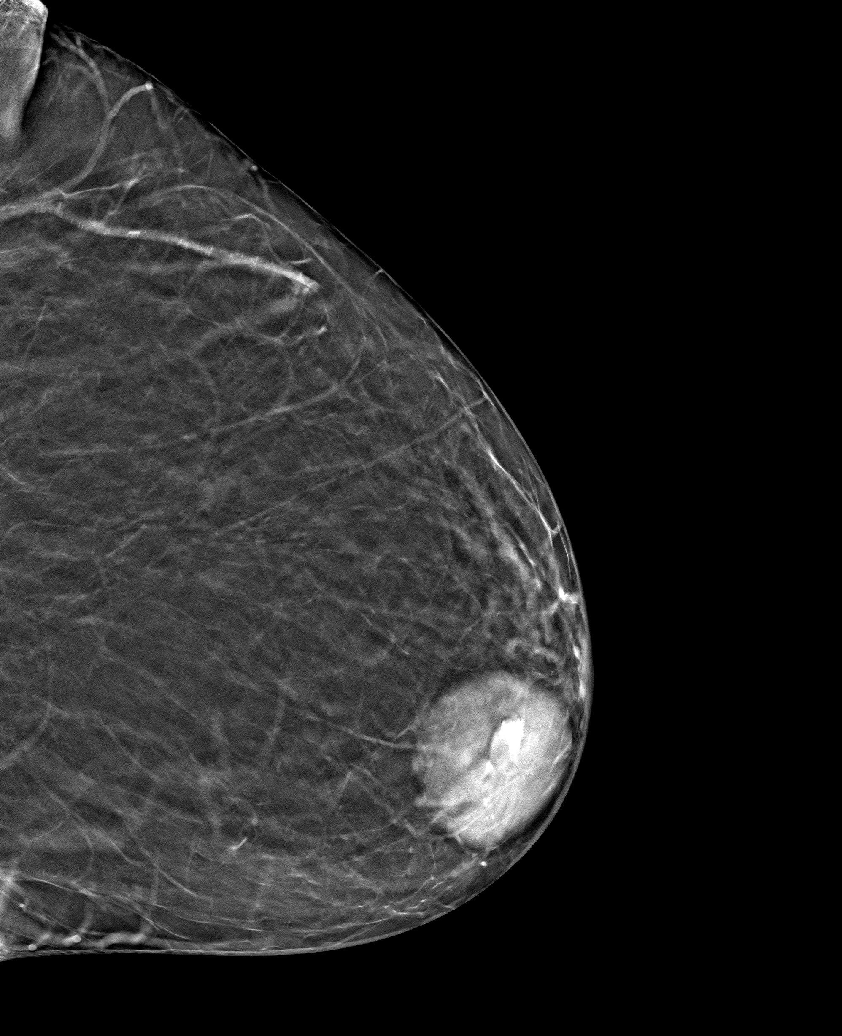

[6 of 30 positions shown; findings below may reference images not displayed]

ACR Breast Density Category b: There are scattered areas of
fibroglandular density.
FINDINGS: There are no findings suspicious for malignancy.
IMPRESSION: No mammographic evidence of malignancy. A result letter of this
screening mammogram will be mailed directly to the patient.

RECOMMENDATION:
Screening mammogram in one year. (Code:51-O-LD2)

BI-RADS CATEGORY  1: Negative.

## 2023-11-01 ENCOUNTER — Ambulatory Visit: Admitting: Psychology

## 2023-11-01 DIAGNOSIS — F321 Major depressive disorder, single episode, moderate: Secondary | ICD-10-CM | POA: Diagnosis not present

## 2023-11-03 ENCOUNTER — Encounter (HOSPITAL_BASED_OUTPATIENT_CLINIC_OR_DEPARTMENT_OTHER): Admitting: General Surgery

## 2023-11-03 DIAGNOSIS — L97812 Non-pressure chronic ulcer of other part of right lower leg with fat layer exposed: Secondary | ICD-10-CM | POA: Diagnosis not present

## 2023-11-04 ENCOUNTER — Encounter (HOSPITAL_COMMUNITY): Payer: Self-pay

## 2023-11-05 ENCOUNTER — Encounter (HOSPITAL_COMMUNITY): Payer: Self-pay

## 2023-11-05 DEATH — deceased

## 2023-11-10 ENCOUNTER — Other Ambulatory Visit: Admitting: Psychology

## 2023-11-17 ENCOUNTER — Encounter (HOSPITAL_BASED_OUTPATIENT_CLINIC_OR_DEPARTMENT_OTHER): Admitting: General Surgery

## 2023-11-24 ENCOUNTER — Encounter (HOSPITAL_BASED_OUTPATIENT_CLINIC_OR_DEPARTMENT_OTHER): Admitting: General Surgery

## 2023-11-29 ENCOUNTER — Encounter (HOSPITAL_BASED_OUTPATIENT_CLINIC_OR_DEPARTMENT_OTHER): Admitting: General Surgery

## 2023-12-08 ENCOUNTER — Ambulatory Visit: Attending: Cardiology | Admitting: Cardiology

## 2023-12-08 NOTE — Progress Notes (Deleted)
 Cardiology Office Note:  .   Date:  12/08/2023  ID:  [name], DOB April 14, 1975, MRN [id number] PCP: Luvenia Sherry, FNP (Inactive)  [facility] Providers Cardiologist:  None { Click to update primary MD,subspecialty MD or APP then REFRESH:1}  History of Present Illness: .   [name] is a 48 y.o. 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  Patient has had severe LVH and mildly reduced LVEF by remote echocardiogram in 2015 at 45 to 50% and is progressively worsened over time.  Patient had a fall on 10/14/2023 and had bilateral superior inferior pubic rami fracture and left sacral ala fracture treated medically and was sent to skilled nursing facility.    Discussed the use of AI scribe software for clinical note transcription with the patient, who gave verbal consent to proceed.  History of Present Illness   Cardiac Studies relevent.      Labs   Lab Results  Component Value Date   CHOL 121 05/03/2016   HDL 42 05/03/2016   LDLCALC 64 05/03/2016   TRIG 73 05/03/2016   CHOLHDL 2.9 05/03/2016   No results found for: LIPOA  Recent Labs    10/17/23 0942 10/19/23 0143 10/21/23 0147  NA 133* 135 134*  K 4.6 4.4 4.5  CL 92* 92* 92*  CO2 23 22 22   GLUCOSE 89 90 96  BUN 32* 32* 30*  CREATININE 8.95* 7.92* 7.08*  CALCIUM  9.5 9.9 10.0  GFRNONAA 5* 6* 7*    Lab Results  Component Value Date   ALT 13 09/29/2023   AST 16 09/29/2023   ALKPHOS 48 09/29/2023   BILITOT 1.1 09/29/2023      Latest Ref Rng & Units 10/21/2023    1:47 AM 10/19/2023     1:43 AM 10/17/2023    9:42 AM  CBC  WBC 4.0 - 10.5 K/uL 11.2  10.7  13.2   Hemoglobin 12.0 - 15.0 g/dL 8.6  8.8  8.9   Hematocrit 36.0 - 46.0 % 27.2  27.8  27.8   Platelets 150 - 400 K/uL 249  256  251    Lab Results  Component Value Date   HGBA1C  02/16/2008    5.4 (NOTE)   The ADA recommends the following therapeutic goal for glycemic   control related to Hgb A1C measurement:   Goal of Therapy:   < 7.0% Hgb A1C   Reference: American Diabetes Association: Clinical Practice   Recommendations 2008, Diabetes Care,  2008, 31:(Suppl 1).    Lab Results  Component Value Date   TSH 0.914 04/25/2013    Care everywhere/Faxed External Labs:  ***  ROS  ***ROS Physical Exam:   VS:  There were no vitals taken for this visit.   Wt Readings from Last 3 Encounters:  10/21/23 251 lb 8.7 oz (114.1 kg)  10/08/23 277 lb 5.4 oz (125.8 kg)  09/30/23 277 lb 5.4 oz (125.8 kg)    BP Readings from Last 3 Encounters:  10/21/23 (!) 121/94  10/08/23 118/76  09/30/23 131/87   ***Physical Exam EKG:         ASSESSMENT AND PLAN: .   No diagnosis found. Assessment and Plan Assessment &  Plan    Follow up: ***  Signed,  Gordy Bergamo, MD, [facility] 12/08/2023, 7:18 AM [facility] [address] [place], [place] [address] Phone: [phone]. Fax:  [phone]
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
